# Patient Record
Sex: Female | Born: 1940
Health system: Southern US, Community
[De-identification: ages and names within clinical notes are randomized; demographics above are authoritative.]

## PROBLEM LIST (undated history)

## (undated) DIAGNOSIS — M8580 Other specified disorders of bone density and structure, unspecified site: Secondary | ICD-10-CM

## (undated) DIAGNOSIS — I480 Paroxysmal atrial fibrillation: Secondary | ICD-10-CM

## (undated) DIAGNOSIS — I119 Hypertensive heart disease without heart failure: Secondary | ICD-10-CM

## (undated) DIAGNOSIS — K5730 Diverticulosis of large intestine without perforation or abscess without bleeding: Secondary | ICD-10-CM

## (undated) DIAGNOSIS — I639 Cerebral infarction, unspecified: Secondary | ICD-10-CM

## (undated) DIAGNOSIS — I10 Essential (primary) hypertension: Secondary | ICD-10-CM

## (undated) DIAGNOSIS — I5042 Chronic combined systolic (congestive) and diastolic (congestive) heart failure: Secondary | ICD-10-CM

## (undated) DIAGNOSIS — G459 Transient cerebral ischemic attack, unspecified: Secondary | ICD-10-CM

## (undated) DIAGNOSIS — Z720 Tobacco use: Secondary | ICD-10-CM

## (undated) DIAGNOSIS — K635 Polyp of colon: Secondary | ICD-10-CM

## (undated) HISTORY — PX: CERVICAL FUSION: SHX112

## (undated) HISTORY — DX: Essential (primary) hypertension: I10

## (undated) HISTORY — DX: Tobacco use: Z72.0

## (undated) HISTORY — DX: Diverticulosis of large intestine without perforation or abscess without bleeding: K57.30

## (undated) HISTORY — DX: Other specified disorders of bone density and structure, unspecified site: M85.80

## (undated) HISTORY — DX: Hypertensive heart disease without heart failure: I11.9

## (undated) HISTORY — DX: Chronic combined systolic (congestive) and diastolic (congestive) heart failure: I50.42

## (undated) HISTORY — PX: CARPAL TUNNEL RELEASE: SHX101

## (undated) HISTORY — PX: BREAST BIOPSY: SHX20

## (undated) HISTORY — PX: TONSILLECTOMY: SUR1361

## (undated) HISTORY — PX: ECTOPIC PREGNANCY SURGERY: SHX613

## (undated) HISTORY — DX: Polyp of colon: K63.5

## (undated) NOTE — *Deleted (*Deleted)
Patient is a 48 year old female with a history of hypertension, CHF, stroke, paroxysmal atrial fibrillation on Eliquis who was recently admitted for a GI bleed.  Patient wears 2 to 2-1/2 L of oxygen at home chronically but reports today she did not feel like her oxygen tank was working and she was feeling very short of breath.  When she arrived at her doctor's office she was found to have oxygen saturation of 88% and was encouraged to come to the emergency room.  Upon arrival here patient was placed on 2 L nasal cannula and she reports the shortness of breath resolved.  She has not noticed any new swelling or weight gain and continues to take her home medications.  On exam she is in no acute distress and again reports no shortness of breath.  However labs do show signs of worsening CHF with some mild pleural effusions.  Sats remain 100% on 2 L.  Will have transitional care team get involved with her home health agency to provide her a new oxygen tank here in the emergency room.  Initial troponin is elevated at 197 without an old to compare but is most likely related to her known CHF and heart strain.  We will get a second troponin to ensure they are not trending up however patient denies any chest pain.  Low suspicion for blood clots at this time.  Otherwise CBC and CMP are at baseline.  Given evidence of some fluid overload patient was given 1 dose of IV Lasix as well as a few days of oral Lasix to help with some diuresis prior to patient following up with her PCP.

## (undated) NOTE — *Deleted (*Deleted)
SATURATION QUALIFICATIONS: (This note is used to comply with regulatory documentation for home oxygen)  Patient Saturations on Room Air at Rest = ***%  Patient Saturations on Room Air while Ambulating = ***%  Patient Saturations on *** Liters of oxygen while Ambulating = ***%  Please briefly explain why patient needs home oxygen: 

---
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
## 2001-04-12 ENCOUNTER — Encounter: Admission: RE | Admit: 2001-04-12 | Discharge: 2001-04-12 | Payer: Self-pay | Admitting: Internal Medicine

## 2001-04-12 ENCOUNTER — Encounter: Payer: Self-pay | Admitting: Internal Medicine

## 2003-12-16 ENCOUNTER — Emergency Department (HOSPITAL_COMMUNITY): Admission: EM | Admit: 2003-12-16 | Discharge: 2003-12-16 | Payer: Self-pay | Admitting: Emergency Medicine

## 2005-01-18 ENCOUNTER — Ambulatory Visit: Payer: Self-pay | Admitting: Internal Medicine

## 2005-03-24 ENCOUNTER — Ambulatory Visit: Payer: Self-pay | Admitting: Internal Medicine

## 2005-04-01 ENCOUNTER — Ambulatory Visit: Payer: Self-pay | Admitting: Internal Medicine

## 2005-06-24 ENCOUNTER — Ambulatory Visit: Payer: Self-pay | Admitting: Internal Medicine

## 2005-06-25 ENCOUNTER — Ambulatory Visit (HOSPITAL_COMMUNITY): Admission: RE | Admit: 2005-06-25 | Discharge: 2005-06-25 | Payer: Self-pay | Admitting: Internal Medicine

## 2005-08-24 ENCOUNTER — Inpatient Hospital Stay (HOSPITAL_COMMUNITY): Admission: RE | Admit: 2005-08-24 | Discharge: 2005-08-25 | Payer: Self-pay | Admitting: Neurosurgery

## 2006-03-29 HISTORY — PX: COLONOSCOPY: SHX174

## 2006-04-01 ENCOUNTER — Ambulatory Visit (HOSPITAL_COMMUNITY): Admission: RE | Admit: 2006-04-01 | Discharge: 2006-04-01 | Payer: Self-pay | Admitting: Neurosurgery

## 2006-07-21 ENCOUNTER — Ambulatory Visit: Payer: Self-pay | Admitting: Internal Medicine

## 2006-07-21 LAB — CONVERTED CEMR LAB
BUN: 13 mg/dL (ref 6–23)
Basophils Absolute: 0.1 10*3/uL (ref 0.0–0.1)
Basophils Relative: 0.7 % (ref 0.0–1.0)
CO2: 33 meq/L — ABNORMAL HIGH (ref 19–32)
Calcium: 9.2 mg/dL (ref 8.4–10.5)
Chloride: 103 meq/L (ref 96–112)
Eosinophils Absolute: 0.1 10*3/uL (ref 0.0–0.6)
GFR calc Af Amer: 81 mL/min
Glucose, Bld: 68 mg/dL — ABNORMAL LOW (ref 70–99)
Hemoglobin: 14.5 g/dL (ref 12.0–15.0)
MCV: 89 fL (ref 78.0–100.0)
Neutro Abs: 7.5 10*3/uL (ref 1.4–7.7)
Neutrophils Relative %: 66.2 % (ref 43.0–77.0)
Platelets: 227 10*3/uL (ref 150–400)
Pro B Natriuretic peptide (BNP): 1143 pg/mL — ABNORMAL HIGH (ref 0.0–100.0)
Sodium: 143 meq/L (ref 135–145)
WBC: 11.5 10*3/uL — ABNORMAL HIGH (ref 4.5–10.5)

## 2006-07-28 ENCOUNTER — Emergency Department (HOSPITAL_COMMUNITY): Admission: EM | Admit: 2006-07-28 | Discharge: 2006-07-28 | Payer: Self-pay | Admitting: Emergency Medicine

## 2006-08-09 ENCOUNTER — Ambulatory Visit: Payer: Self-pay

## 2006-08-09 ENCOUNTER — Encounter: Payer: Self-pay | Admitting: Cardiology

## 2006-08-16 ENCOUNTER — Ambulatory Visit: Payer: Self-pay | Admitting: Cardiology

## 2006-08-30 ENCOUNTER — Ambulatory Visit: Payer: Self-pay | Admitting: Cardiology

## 2006-10-07 ENCOUNTER — Ambulatory Visit: Payer: Self-pay | Admitting: Cardiology

## 2007-01-13 ENCOUNTER — Ambulatory Visit: Payer: Self-pay | Admitting: Cardiology

## 2007-01-27 ENCOUNTER — Ambulatory Visit: Payer: Self-pay | Admitting: Internal Medicine

## 2007-01-27 ENCOUNTER — Encounter (HOSPITAL_COMMUNITY): Admission: RE | Admit: 2007-01-27 | Discharge: 2007-02-26 | Payer: Self-pay | Admitting: Cardiology

## 2008-02-18 DIAGNOSIS — F17200 Nicotine dependence, unspecified, uncomplicated: Secondary | ICD-10-CM

## 2008-02-18 DIAGNOSIS — I428 Other cardiomyopathies: Secondary | ICD-10-CM | POA: Insufficient documentation

## 2008-02-18 DIAGNOSIS — M949 Disorder of cartilage, unspecified: Secondary | ICD-10-CM

## 2008-02-18 DIAGNOSIS — K5730 Diverticulosis of large intestine without perforation or abscess without bleeding: Secondary | ICD-10-CM | POA: Insufficient documentation

## 2008-02-18 DIAGNOSIS — M899 Disorder of bone, unspecified: Secondary | ICD-10-CM | POA: Insufficient documentation

## 2008-03-05 ENCOUNTER — Ambulatory Visit: Payer: Self-pay | Admitting: Cardiology

## 2008-09-18 ENCOUNTER — Encounter (INDEPENDENT_AMBULATORY_CARE_PROVIDER_SITE_OTHER): Payer: Self-pay | Admitting: *Deleted

## 2008-10-21 ENCOUNTER — Encounter: Payer: Self-pay | Admitting: Cardiology

## 2008-10-21 ENCOUNTER — Ambulatory Visit: Payer: Self-pay | Admitting: Cardiology

## 2008-10-23 ENCOUNTER — Encounter: Payer: Self-pay | Admitting: Cardiology

## 2008-10-23 ENCOUNTER — Ambulatory Visit (HOSPITAL_COMMUNITY): Admission: RE | Admit: 2008-10-23 | Discharge: 2008-10-23 | Payer: Self-pay | Admitting: Cardiology

## 2008-10-23 ENCOUNTER — Ambulatory Visit: Payer: Self-pay | Admitting: Cardiology

## 2008-10-23 LAB — CONVERTED CEMR LAB
ALT: 11 units/L (ref 0–35)
Alkaline Phosphatase: 76 units/L (ref 39–117)
BUN: 21 mg/dL (ref 6–23)
Chloride: 108 meq/L (ref 96–112)
Glucose, Bld: 79 mg/dL (ref 70–99)
Potassium: 4.7 meq/L (ref 3.5–5.3)
Sodium: 143 meq/L (ref 135–145)

## 2008-10-24 ENCOUNTER — Encounter (INDEPENDENT_AMBULATORY_CARE_PROVIDER_SITE_OTHER): Payer: Self-pay | Admitting: *Deleted

## 2009-10-20 ENCOUNTER — Ambulatory Visit: Payer: Self-pay | Admitting: Cardiology

## 2009-10-22 ENCOUNTER — Ambulatory Visit: Payer: Self-pay | Admitting: Cardiology

## 2009-10-22 ENCOUNTER — Ambulatory Visit (HOSPITAL_COMMUNITY): Admission: RE | Admit: 2009-10-22 | Discharge: 2009-10-22 | Payer: Self-pay | Admitting: Cardiology

## 2009-10-22 ENCOUNTER — Encounter: Payer: Self-pay | Admitting: Cardiology

## 2010-04-20 ENCOUNTER — Encounter (INDEPENDENT_AMBULATORY_CARE_PROVIDER_SITE_OTHER): Payer: Self-pay | Admitting: *Deleted

## 2010-04-23 ENCOUNTER — Ambulatory Visit
Admission: RE | Admit: 2010-04-23 | Discharge: 2010-04-23 | Payer: Self-pay | Source: Home / Self Care | Attending: Cardiology | Admitting: Cardiology

## 2010-04-28 NOTE — Assessment & Plan Note (Signed)
Summary: 10 mth fu/sn  Medications Added LEVOCETIRIZINE DIHYDROCHLORIDE 5 MG TABS (LEVOCETIRIZINE DIHYDROCHLORIDE) Take 1 tablet by mouth once a day NICOTINE 21 MG/24HR PT24 (NICOTINE) weeks 1-4  once daily. NICOTINE 14 MG/24HR PT24 (NICOTINE) weeks 5-6 once daily NICOTINE 7 MG/24HR PT24 (NICOTINE) once      Allergies Added: NKDA  Visit Type:  Follow-up 10 month Primary Provider:  Dr.Wert   History of Present Illness: [name] returns to the office as scheduled for continued assessment and treatment of cardiomyopathy.  Since her initial presentation in 2008, she's been virtually asymptomatic from a cardiac standpoint.  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  She notes no dyspnea, chest pain, lightheadedness or syncope with her usual activities.  She has had no orthopnea, PND nor pedal edema.  Her principal problems involve low back pain exacerbated by movement and skin sensitivity to heat and sun.   Preventive Screening-Counseling & Management  Alcohol-Tobacco     Smoking Status: current     Smoking Cessation Counseling: yes     Smoke Cessation Stage: contemplative     Packs/Day: 0.75     Year Started: 1971  Current Medications (verified): 1)  Furosemide 20 Mg Tabs (Furosemide) .... Take 1 Tab Daily 2)  Coreg 25 Mg Tabs (Carvedilol) .Marland Kitchen.. 1 Tab Two Times A Day 3)  Lisinopril 40 Mg Tabs (Lisinopril) .Marland Kitchen.. 1 Tab Once Daily 4)  Amlodipine Besylate 5 Mg Tabs (Amlodipine Besylate) .Marland Kitchen.. 1 Tab Once Daily 5)  Aleve 220 Mg Tabs (Naproxen Sodium) .... As Needed 6)  Levocetirizine Dihydrochloride 5 Mg Tabs (Levocetirizine Dihydrochloride) .... Take 1 Tablet By Mouth Once A Day 7)  Nicotine 21 Mg/24hr Pt24 (Nicotine) .... Weeks 1-4  Once Daily. 8)  Nicotine 14 Mg/24hr Pt24 (Nicotine) .... Weeks 5-6 Once Daily 9)  Nicotine 7 Mg/24hr Pt24 (Nicotine) ....  Once  Allergies (verified): No Known Drug Allergies  Past History:  PMH, FH, and Social History reviewed and updated.  Social History: Smoking Status:  current Packs/Day:  0.75  Review of Systems       See history of present illness.  Vital Signs:  Patient profile:   70 year old female Height:      66 inches Weight:      170 pounds BMI:     27.54 O2 Sat:      96 % on Room air Temp:     98.3 degrees F oral Pulse rate:   63 / minute BP sitting:   126 / 76  (left arm)  Vitals Entered By: Dreama Saa, CNA (October 20, 2009 11:48 AM)  Nutrition Counseling: Patient's BMI is greater than 25 and therefore counseled on weight management options.  O2 Flow:  Room air  Physical Exam  General:  Mildly overweight; well developed; no acute distress:   Neck-No JVD; no carotid bruits: Lungs-No tachypnea, no rales; no rhonchi; no wheezes: Cardiovascular-normal PMI; normal S1 and S2; minimal systolic ejection murmur: Abdomen-BS normal; soft and non-tender without masses or organomegaly:  Musculoskeletal-No deformities, no cyanosis or clubbing: Neurologic-Normal cranial nerves; symmetric strength and tone:  Skin-Warm, minimal areas of vitiligo over the left lower extremity Extremities-Nl distal pulses except for a decreased left dorsalis pedis; no edema:     Impression & Recommendations:  Problem # 1:  TOBACCO ABUSE (ICD-305.1) Patient appears motivated to completely discontinue cigarette smoking.  I described the recent  adverse effects reported with Chantix.  She is concerned about these and would prefer to utilize nicotine replacement therapy.  We will provide her information about quitting in general and about the use of transdermal nicotine patches, which she will purchase without a prescription.  Problem # 2:  ESSENTIAL HYPERTENSION, BENIGN (ICD-401.1) Blood pressure control has been good; current medications will be continued.  Problem # 3:  CARDIOMYOPATHY (ICD-425.4) She  continues to do extremely well with no symptoms related to her heart disease, and no episodes of decompensation with a fairly modest medical regime.  A repeat echocardiogram will be obtained to determine if left ventricular systolic function has recovered.  I will plan to reassess this nice woman in 6 months.  The importance of discontinuing tobacco use will be emphasized.  If her echocardiogram is now normal or near normal, medications will be tapered as possible.  Other Orders: T-Comprehensive Metabolic Panel [phone]) T-CBC w/Diff [phone]) T-BNP  (B Natriuretic Peptide) ([phone]) 2-D Echocardiogram (2D Echo)  Patient Instructions: 1)  Your physician recommends that you schedule a follow-up appointment in: 6 months 2)  Your physician recommends that you return for lab work [id number] 3)  Your physician has requested that you have an echocardiogram.  Echocardiography is a painless test that uses sound waves to create images of your heart. It provides your doctor with information about the size and shape of your heart and how well your heart's chambers and valves are working.  This procedure takes approximately one hour. There are no restrictions for this procedure. Prescriptions: NICOTINE 7 MG/24HR PT24 (NICOTINE) once  #1 x 0   Entered by:   Teressa Lower RN   Authorized by:   Kathlen Brunswick, MD, [facility]   Signed by:   Teressa Lower RN on 10/20/2009   Method used:   Electronically to        [facility]. [phone]* (retail)       [address] [place], [place]  [address]       Ph: [phone]       Fax: [phone]   RxID:   [id number] NICOTINE 14 MG/24HR PT24 (NICOTINE) weeks 5-6 once daily  #14 x 0   Entered by:   Teressa Lower RN   Authorized by:   Kathlen Brunswick, MD, [facility]   Signed by:   Teressa Lower RN on 10/20/2009   Method used:   Electronically to        [facility]. [phone]* (retail)       [address] [place], [place]   [address]       Ph: [phone]       Fax: [phone]   RxID:   [id number] NICOTINE 21 MG/24HR PT24 (NICOTINE) weeks 1-4  once daily.  #30 x 0   Entered by:   Teressa Lower RN   Authorized by:   Kathlen Brunswick, MD, [facility]   Signed by:   Teressa Lower RN on 10/20/2009   Method used:   Electronically to        [facility]. [phone]* (retail)       [address] [place]  [address]       Ph: [phone]       Fax: [phone]   RxID:   [id number]

## 2010-04-30 NOTE — Miscellaneous (Signed)
Summary: echo 10/22/2009  Clinical Lists Changes  Observations: Added new observation of ECHOINTERP:  Study Conclusions    - Left ventricle: The cavity size was normal. Wall thickness was     increased in a pattern of mild LVH. Systolic function was normal.     The estimated ejection fraction was in the range of 55% to 60%.     Wall motion was normal; there were no regional wall motion     abnormalities. Doppler parameters are consistent with abnormal     left ventricular relaxation (grade 1 diastolic dysfunction).   - Mitral valve: Trivial regurgitation.   - Left atrium: The atrium was at the upper limits of normal in size.   - Atrial septum: The septum was thickened. The interatrial septum     was hypermobile. A patent foramen ovale cannot be excluded.   - Tricuspid valve: Trivial regurgitation.   - Pulmonary arteries: PA peak pressure: 37mm Hg (S).   - Pericardium, extracardiac: There was no pericardial effusion.   Transthoracic echocardiography. M-mode, complete 2D, spectral   Doppler, and color Doppler. Height: Height: 165.1cm. Height: 65in.   Weight: Weight: 77.1kg. Weight: 169.6lb. Body mass index: BMI:   28.3kg/m^2. Body surface area: BSA: 1.66m^2. Patient status:   Outpatient. Location: Echo laboratory.    --------------------------------------------------------------------  (10/22/2009 15:50)      Echocardiogram  Procedure date:  10/22/2009  Findings:       Study Conclusions    - Left ventricle: The cavity size was normal. Wall thickness was     increased in a pattern of mild LVH. Systolic function was normal.     The estimated ejection fraction was in the range of 55% to 60%.     Wall motion was normal; there were no regional wall motion     abnormalities. Doppler parameters are consistent with abnormal     left ventricular relaxation (grade 1 diastolic dysfunction).   - Mitral valve: Trivial regurgitation.   - Left atrium: The atrium was at the upper limits  of normal in size.   - Atrial septum: The septum was thickened. The interatrial septum     was hypermobile. A patent foramen ovale cannot be excluded.   - Tricuspid valve: Trivial regurgitation.   - Pulmonary arteries: PA peak pressure: 37mm Hg (S).   - Pericardium, extracardiac: There was no pericardial effusion.   Transthoracic echocardiography. M-mode, complete 2D, spectral   Doppler, and color Doppler. Height: Height: 165.1cm. Height: 65in.   Weight: Weight: 77.1kg. Weight: 169.6lb. Body mass index: BMI:   28.3kg/m^2. Body surface area: BSA: 1.22m^2. Patient status:   Outpatient. Location: Echo laboratory.    --------------------------------------------------------------------

## 2010-05-06 NOTE — Assessment & Plan Note (Signed)
Summary: Z6X      Allergies Added: NKDA  Visit Type:  Follow-up Primary Provider:  Dr.Wert   History of Present Illness: [name] returns to the office for continued assessment and treatment of cardiomyopathy.  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  Weight has been stable.  She was unable to discontinue cigarette smoking with the use of transdermal nicotine replacement therapy.  Current Medications (verified): 1)  Furosemide 20 Mg Tabs (Furosemide) .... Take 1 Tab Daily 2)  Coreg 25 Mg Tabs (Carvedilol) .Marland Kitchen.. 1 Tab Two Times A Day 3)  Lisinopril 40 Mg Tabs (Lisinopril) .Marland Kitchen.. 1 Tab Once Daily 4)  Amlodipine Besylate 5 Mg Tabs (Amlodipine Besylate) .Marland Kitchen.. 1 Tab Once Daily 5)  Aleve 220 Mg Tabs (Naproxen Sodium) .... As Needed 6)  Levocetirizine Dihydrochloride 5 Mg Tabs (Levocetirizine Dihydrochloride) .... Take 1 Tablet By Mouth Once A Day  Allergies (verified): No Known Drug Allergies  Comments:  Nurse/Medical Assistant: [facility] is patients pharmacy patient and i reviewed meds she is not using her nicotine patches  Past History:  PMH, FH, and Social History reviewed and updated.  Past Medical History: Hypertension. Cardiomyopathy with a history of congestive heart failure and an EF of 25% in 5/08; nl EF on the echo in 2011 Cigarette consumption-50 pack years. Osteopenia. Diverticular disease and colonic polyps.  Past Surgical History: C-Section x 2. Ectopic pregnancy. Breast biopsy. Cervical spine fusion. Right, carpal tunnel release. Colonoscopy-2008; patient uncertain as to whether polypectomy required or performed  Review of Systems       See history of present illness  Vital Signs:  Patient profile:   70 year old female Weight:      173 pounds BMI:     28.02 O2 Sat:      91 % on Room air Pulse rate:   71  / minute BP sitting:   152 / 81  (left arm)  Vitals Entered By: Brandy Saa, CNA (April 23, 2010 1:50 PM)  O2 Flow:  Room air  Physical Exam  General:  Mildly overweight; well developed; no acute distress; raspy voice, which is chronic Neck-No JVD; no carotid bruits: Lungs-No tachypnea, no rales; no rhonchi; no wheezes: Cardiovascular-normal PMI; normal S1 and S2; minimal systolic ejection murmur: Abdomen-BS normal; soft and non-tender without masses or organomegaly:  Musculoskeletal-No deformities, no cyanosis or clubbing: Neurologic-Normal cranial nerves; symmetric strength and tone:  Skin-Warm, minimal areas of vitiligo over the left lower extremity Extremities-distal pulses intact; trace edema:     Impression & Recommendations:  Problem # 1:  CARDIOMYOPATHY (ICD-425.4) Ejection fraction has improved dramatically.  While this could be the result of her medication, I suspect that LV systolic function has recovered.  She is advised to use diuretic on a p.r.n. basis to maintain her current weight.  Her other medications are appropriate for treatment of hypertension.  Problem # 2:  HYPERTENSION (ICD-401.1) Blood pressure control remains suboptimal at this visit.  Patient will collect additional values as an outpatient and return in 2 months for reassessment by the cardiology nurses.  Problem # 3:  TOBACCO ABUSE (ICD-305.1) Brandy [name] is referred to the smoking cessation program of the [facility] psychology division.  My nurses will check on her progress at her 2 month followup visit.  A return visit is anticipated in one year.  Other Orders: Misc. Referral (Misc. Ref)  Patient Instructions: 1)  Your physician recommends that you schedule a follow-up appointment in:  2 months with nurse to see how you are doing with smoking and to check blood pressure 2)  Your physician recommends that you continue on your current medications as directed. Please refer to the Current Medication list  given to you today. 3)  Your physician has requested that you regularly monitor and record your blood pressure readings at home.  Please use the same machine at the same time of day to check your readings and record them to bring to your follow-up visit.

## 2010-05-13 ENCOUNTER — Encounter (INDEPENDENT_AMBULATORY_CARE_PROVIDER_SITE_OTHER): Payer: Self-pay

## 2010-05-13 DIAGNOSIS — F17200 Nicotine dependence, unspecified, uncomplicated: Secondary | ICD-10-CM

## 2010-05-19 ENCOUNTER — Telehealth (INDEPENDENT_AMBULATORY_CARE_PROVIDER_SITE_OTHER): Payer: Self-pay | Admitting: *Deleted

## 2010-05-26 NOTE — Progress Notes (Signed)
Summary: RX REFILL PT IS OUT   Phone Note Call from Patient Call back at Home Phone [phone]   Caller: PT Reason for Call: Refill Medication, Talk to Nurse Summary of Call: PT NEEDS CARVEDILOL CALLED IN [facility] SHE IS OUT OF MEDS/TMJ Initial call taken by: Faythe Ghee,  May 19, 2010 2:02 PM    Prescriptions: COREG 25 MG TABS (CARVEDILOL) 1 tab two times a day  #180 x 3   Entered by:   Teressa Lower RN   Authorized by:   Kathlen Brunswick, MD, [facility]   Signed by:   Teressa Lower RN on 05/19/2010   Method used:   Electronically to        [facility]. [phone]* (retail)       [address] [place]  [address]       Ph: [phone]       Fax: [phone]   RxID:   [id number]

## 2010-06-02 ENCOUNTER — Ambulatory Visit: Payer: Self-pay

## 2010-06-22 ENCOUNTER — Ambulatory Visit (INDEPENDENT_AMBULATORY_CARE_PROVIDER_SITE_OTHER): Payer: Medicare Other

## 2010-06-22 VITALS — BP 143/81 | HR 62 | Ht 66.0 in | Wt 174.0 lb

## 2010-06-22 DIAGNOSIS — I428 Other cardiomyopathies: Secondary | ICD-10-CM

## 2010-06-22 NOTE — Progress Notes (Signed)
S: 2 month follow up B: last office visit  04/23/2010, no change in meds, bp diary A: pt did not do bp readings, no c/o R: await your response  06/29/2010  Increase amlodipine to 10 mg per day Collect blood pressures outside the office as previously requested  [place],  M.D.

## 2010-06-30 ENCOUNTER — Telehealth: Payer: Self-pay

## 2010-06-30 MED ORDER — AMLODIPINE BESYLATE 10 MG PO TABS: 10.0000 mg | ORAL_TABLET | Freq: Every day | ORAL | Status: DC

## 2010-07-01 NOTE — Progress Notes (Signed)
Medication increase discussed and sent to pharmacy by L. Via, LPN, continue to check home blood pressures

## 2010-08-11 NOTE — Letter (Signed)
August 30, 2006    Casimiro Needle B. Sherene Sires, MD, FCCP  [address]  [place] [address]   RE:  Brandy [name], Brandy [name]  MRN:  [id number]  /  DOB:  1940-10-20   Dear Brandy [name]:   [name] returns to the office for continued close followup of  cardiomyopathy. Since her last visit, she feels better. She has  increased energy and increased exercise capacity, but continues to have  a relatively sedentary lifestyle. 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 She notes no edema. She has had no lightheadedness nor syncope.  She notes frequent urination after she takes furosemide in the morning,  but less as the day goes on. She does have nocturia twice overnight.   CURRENT MEDICATIONS:  1. Furosemide 20 mg daily.  2. Carvedilol 6.25 mg daily.  3. Lisinopril 20 mg daily.   PHYSICAL EXAMINATION:  Very well put together woman in no acute  distress. Weight is 161, two pounds less than at her last visit. Blood  pressure 135/80. Heart rate 72 and regular. Respirations 16.  NECK: No jugular venous distention; no carotid bruits.  LUNGS:  Clear.  CARDIAC: Normal 1st and 2nd heart sounds; no third heart sound  appreciated. Modest systolic murmur. Normal PMI.  ABDOMEN: Soft and nontender; no masses; no organomegaly.  EXTREMITIES: Distal pulses intact; no edema.   IMPRESSION:  Brandy [name] is doing well with initial therapy. A chemistry  profile will be repeated. She appears to euvolemic at the present time.  We will titrate carvedilol in a stepwise fashion to  25 mg b.i.d. and lisinopril will be increased to 40  mg daily. She will  continue to monitor blood pressures on an outpatient basis-these have  been elevated with systolics in the 160-170 range. I will see her again  in 5 weeks. At that time, we will consider proceeding with cardiac  catheterization.    Sincerely,      Gerrit Friends. Dietrich Pates, MD, [facility]  Electronically Signed    RMR/MedQ  DD: 08/30/2006  DT: 08/30/2006  Job #: [id number]

## 2010-08-11 NOTE — Letter (Signed)
Aug 16, 2006    Charlaine Dalton. Sherene Sires, M.D., FCCP  [address]  [place], [place]  [address]   Dear Kathlene November:   It was my pleasure to have evaluated this in the office today at your  request.  As you know this nice woman is a long time cigarette smoker  whom you follow for COPD and other medical issues.  She was recently  noted to have increasing dyspnea and fatigue.  She reports moderately  severe shortness of breath with mild exertion that promptly resolves  with rest.  She has had no orthopnea nor PND.  There are no associated  symptoms.  She has had no chest discomfort.  Recent chest x-ray showed  nonspecific findings including some atelectasis and a small left pleural  effusion.  A BNP level was moderately elevated at approximately 1100.  An echocardiogram showed left ventricular dysfunction with an ejection  fraction of  0.20-0.25.  The left atrium was moderately enlarged.  There  was mild left ventricular enlargement.  No significant valvular  abnormalities were identified.   The patient has no prior cardiac history.  She has never previously been  seen by a cardiologist.  She has not undergone significant cardiac  testing.  She has had hypertension that has been suboptimally  controlled.  She does not have diabetes.  She is not aware of her lipid  status.  She does not drink alcohol to excess nor does she have any risk  factors for HIV infection.   RE:  DENNIE, VECCHIO  MRN:  [id number]  /  DOB:  1940-04-18   PAST MEDICAL HISTORY:  The past medical history is otherwise notable for  a history of diverticulosis.  She has osteopenia as documented by a bone  density scan approximately 18 months ago.   PAST SURGICAL HISTORY:  Prior surgeries have included two C sections, a  cervical spinal fusion approximately one year ago, surgery for an  ectopic pregnancy and excisional left breast biopsy for benign disease,  and release of a right carpal tunnel.   ALLERGIES:  The patient has no  known allergies.   MEDICATIONS:  Current medications include verapamil 240 mg daily and  hydrochlorothiazide 25 mg daily.   SOCIAL HISTORY:  The patient is employed as a Runner, broadcasting/film/video.  She is married  with two children.  She continues to smoke cigarettes at the rate of  approximately a half-pack per day.  No excessive use of alcohol.   FAMILY HISTORY:  Father is deceased due to cancer.  Mother died as a  result of postoperative complications.  She has one sibling who is alive  and well; and, as well two children.   REVIEW OF SYSTEMS:  The review of systems is notable only for the need  of corrective lenses for reading.  All other systems are reviewed and  are negative.   PHYSICAL EXAMINATION:  GENERAL APPEARANCE:  On exam this is a pleasant  woman with a raspy voice who is in no acute distress.  VITAL SIGNS:  The weight is 163 pounds; stable when compared to April  2008.  Blood pressure 130/80, heart rate 76 and regular, and  respirations 16.  NECK:  The neck has no jugular venous distention.  Normal carotid  upstrokes without bruits.  ENDOCRINE SYSTEM:  One plus diffuse thyroid enlargement.  HEMAPOIETIC SYSTEM:  No adenopathy.  HEENT:  The patient has bilateral arcus.  EOMs are full.  Normal oral  mucosa.  No lids  and conjunctivae.  LUNGS:  There are coarse breath sounds at both bases.  No rales.  HEART:  Cardiac exam shows normal first and second heart sounds.  A  fourth heart sound is present.  Normal PMI.  No left ventricular lifts.  ABDOMEN:  The abdomen is soft and nontender.  No organomegaly.  EXTREMITIES:  The extremities have 1+ pitting pretibial edema.  The  distal pulses are intact.  NEUROLOGIC EXAMINATION:  Neuromuscular - symmetric strength and tone.  Normal cranial nerves.  PSYCHIATRIC:  Alert and oriented.  Normal affect.   LABORATORY DATA:  Electrocardiogram showed normal sinus rhythm, left  atrial enlargement, delayed R wave progression, cannot exclude prior   septal myocardial infarction.  There are leftward axis and right atrial  enlargement.  Left ventricular hypertrophy is present with  repolarization abnormalities.   IMPRESSION:  This patient presents with cardiomyopathy, but not much in  the way of congestive symptoms.  She does have some edema and other  signs of minor volume expansion.   We will change hydrochlorothiazide to Lasix 20 mg daily.   The patient has left ventricular dysfunction of uncertain etiology.  Eventually it will be necessary to rule out ischemic heart disease as  she has substantial risk factors.   For now we will optimize medication by stopping verapamil and starting  carvedilol 6.25 mg daily and lisinopril 20 mg daily.  She will monitor  her blood pressure at home, follow a low sodium diet and weigh herself  on a regular basis.   I plan to reevaluate this woman in two weeks at which time a metabolic  profile will be obtained.    Sincerely,      Gerrit Friends. Dietrich Pates, MD, [facility]  Electronically Signed    RMR/MedQ  DD: 08/16/2006  DT: 08/16/2006  Job #: [id number]

## 2010-08-11 NOTE — Assessment & Plan Note (Signed)
[facility]                       [place] [facility] OFFICE NOTE   NAME:Brandy, [name]                       MRN:          [id number]  DATE:10/07/2006                            DOB:          12/06/1940    [name] return to the office for continued assessment and treatment of  cardiomyopathy.  Since her last visit, she has felt quite well.  She  denies all cardiopulmonary symptoms.  She has unfortunately confused her  medications, to a degree.  She has taken furosemide 20 mg daily and  lisinopril 40 mg daily, as requested, but is only taking Coreg 25 mg  daily instead of b.i.d.   PHYSICAL EXAMINATION:  GENERAL:  A pleasant, proportionate woman in no  acute distress.  VITAL SIGNS:  Weight is 162, 1 pound more than at her last visit.  Blood  pressure 145/90, heart rate 65 and regular, respirations 18.  NECK:  No jugular venous distention; no carotid bruits.  LUNGS:  Clear.  CARDIAC:  Normal first and second heart sounds; minimal systolic  ejection murmur.  ABDOMEN:  Soft and nontender; no organomegaly.  EXTREMITIES:  Trace edema.   The most recent laboratory was one month ago.  A complete metabolic  profile was entirely normal.   RHYTHM STRIP:  Normal sinus rhythm; normal P-R interval; IVCD.   IMPRESSION:  [name] is doing generally well with medical therapy.  Her  dose of carvedilol will be increased to 25 mg b.i.d., which hopefully  will provide optimal blood pressure.  A chemistry profile will be  obtained in six weeks with a return office visit in three months.     Gerrit Friends. Dietrich Pates, MD, [facility]  Electronically Signed    RMR/MedQ  DD: 10/07/2006  DT: 10/08/2006  Job #: [id number]

## 2010-08-11 NOTE — Assessment & Plan Note (Signed)
[facility]                       [place] [facility] OFFICE NOTE   NAME:BELLKawthar, Ennen                       MRN:          [id number]  DATE:03/05/2008                            DOB:          1940-11-21    Ms. Harm returns to the office beyond her anticipated followup visit for  cardiomyopathy with a history of congestive heart failure.  Fortunately,  she has done quite well from symptomatic standpoint.  She has class II-  III dyspnea with walking stairs or excessive effort.  Symptoms resolve  quickly with rest.  She has had no orthopnea nor PND.  She has noted no  pedal edema.   A stress nuclear study was performed in late October.  She failed to  return for a post-test visit.  That study showed moderately impaired  left ventricular systolic function with an estimated ejection fraction  of 0.37, which was somewhat improved compared to previous  echocardiogram.  The reading was otherwise equivocal with possible  evidence of scar in the distribution of mid distal LAD or breast  attenuation artifact.  There was no evidence for ischemia.   Ms. Adduci has had no new medical problems.  She tried to discontinue  cigarette smoking with the assistance of Chantix, but was unsuccessful.   Current medications include furosemide 20 mg daily, carvedilol 25 mg  b.i.d., lisinopril 40 mg daily, amlodipine 5 mg daily.  She uses Aleve  on a p.r.n. basis.   On exam, pleasant woman in no acute distress.  The weight is 166, 2  pounds more than in October of 2008.  Blood pressure 130/80, heart rate  65 and regular, respirations 12 and unlabored.  Neck, no jugular venous  distention; no carotid bruits.  Lungs, clear.  Cardiac, normal first and  second heart sounds; modest systolic ejection murmur.  Fourth heart  sound present.  Abdomen, soft and nontender; no organomegaly.  Extremities, trace edema.   IMPRESSION:  Ms. Wisz is doing quite well with current medical  therapy.  We once again discussed the advisability of an automatic implantable  cardioverter-defibrillator.  Once again, she is not willing to commit to  this device.  Monitoring laboratories including a chemistry profile and  BNP level will be obtained.  If results are good, I will plan to see  this nice woman again in 6 months.  She once again agrees to attempt to  stop smoking cigarettes.     Gerrit Friends. Dietrich Pates, MD, [facility]  Electronically Signed    RMR/MedQ  DD: 03/05/2008  DT: 03/06/2008  Job #: [id number]

## 2010-08-11 NOTE — Letter (Signed)
January 13, 2007    Casimiro Needle B. Sherene Sires, MD, FCCP  [address]  [place] [address]   RE:  Brandy [name], Brandy [name]  MRN:  [id number]  /  DOB:  Aug 11, 1940   Dear Kathlene November:   Brandy [name] return to the office for continued assessment and treatment of  cardiomyopathy.  Since her last visit, she has done quite well from that  standpoint.  She reports no dyspnea, orthopnea, palpitations, PND, nor  chest discomfort.  She has developed pain over her left ankle with  erythema.  Brandy followed a flare of the chronic skin condition that she  has in both feet.  She reports no fevers or chills.   CURRENT MEDICATIONS:  1. Furosemide 20 mg daily.  2. Carvedilol 25 mg b.i.d.  3. Lisinopril 40 mg daily.   A chemistry profile three months ago was normal.   PHYSICAL EXAMINATION:  A very pleasant, well-appearing [name] in no acute  distress.  The weight is 164, 2 pounds more than three months ago.  Blood pressure  150/80, heart rate 62 and regular, respirations 16.  Temperature 97.3.  NECK:  No jugular venous distention; no carotid bruits.  LUNGS:  Expiratory rhonchi, particularly at the left base.  Prolonged  expiratory phase.  CARDIAC:  Normal first and second heart sounds; fourth heart sound  present. No murmur appreciated.  ABDOMEN:  Soft and nontender; no organomegaly.  EXTREMITIES:  Erythema over the medial aspect of the left ankle,  extending somewhat up the left lower leg with mild edema and moderate  tenderness.  Some discomfort with standing and with ankle flexion but no  clear joint effusion.   IMPRESSION:  Brandy [name] is doing extremely well from a cardiac standpoint.  We discussed the possibility of a defibrillator implant.  She would like  to think about Brandy and discuss it again at our next appointment.  She  continues to smoke cigarettes at the rate of 3/4 pack per day.  She  agrees to a trial of Chantix and smoking cessation.  Finally, she  appears to have cellulitis of the left ankle.  Brandy  could be gout, but I  do not think so.  We will give her a course of cephalexin and re-  evaluate her in one week, at which time a stress test will be performed  to evaluate the etiology of her cardiomyopathy.  I will see Brandy nice  [name] again in the office in three months but will recheck her  cellulitis in one week.  Her blood pressure is suboptimally controlled.  She will start amlodipine 5 mg daily and continue to monitor her blood  pressure at work.    Sincerely,      Gerrit Friends. Dietrich Pates, MD, [facility]  Electronically Signed    RMR/MedQ  DD: 01/13/2007  DT: 01/14/2007  Job #: [id number]

## 2010-08-14 NOTE — Op Note (Signed)
[name], Brandy [name]              ACCOUNT NO.:  [id number]   MEDICAL RECORD NO.:  [id number]          PATIENT TYPE:  AMB   LOCATION:  SDS                          FACILITY:  [facility]   PHYSICIAN:  Henry A. Pool, M.D.    DATE OF BIRTH:  1940-04-03   DATE OF PROCEDURE:  DATE OF DISCHARGE:                               OPERATIVE REPORT   PREOPERATIVE DIAGNOSIS:  Right carpal tunnel syndrome.   POSTOPERATIVE DIAGNOSIS:  Right carpal tunnel syndrome.   PROCEDURE NOTE:  Right carpal tunnel release.   SURGEON:  Kathaleen Maser. Pool, M.D.   ANESTHESIA:  Regional Bier block.   INDICATIONS:  Ms. Mersch 70 year old female with history of right hand  pain and paresthesias with some degree of weakness consistent with  carpal tunnel syndrome confirmed by EMGs and nerve conduction studies  failing conservative management.  She presents now for right-sided  carpal tunnel release.   OPERATIVE NOTE:  Patient taken operating room, placed on operating table  in supine position.  After adequate level of regional anesthesia  achieved, the patient's right hand and forearm were prepped and draped  sterilely.  15 blade used to make a linear skin incision along the mid  palmar line just distal to the distal wrist crease.  This carried down  sharply through the palmar fascia down to level of the transverse carpal  ligament.  Self-retaining retractor was placed.  Transverse carpal  ligament was then divided exposing the underlying median nerve.  The  median nerve was then protected with a Therapist, nutritional and the transverse  carpal ligament was then divided distally into the palm and then divided  proximally back up into the forearm.  All elements of the ligament were  divided.  There is no evidence of injury to the underlying nerve.  The  nerve itself was well decompressed.  Wound was then irrigated and then  closed with 4-0 Vicryl suture in the dermis in interrupted subcuticular  fashion and a 4-0 nylon in  interrupted vertical mattress fashion at the  surface.  There no complications.  The patient tolerated procedure well  and she returns for postoperative care.           ______________________________  Kathaleen Maser. Pool, M.D.     HAP/MEDQ  D:  04/01/2006  T:  04/01/2006  Job:  [id number]

## 2010-08-14 NOTE — Op Note (Signed)
NAMECYLA, HALUSKA NO.:  [id number]   MEDICAL RECORD NO.:  [id number]          PATIENT TYPE:  INP   LOCATION:  3004                         FACILITY:  [facility]   PHYSICIAN:  Kathaleen Maser. Pool, M.D.    DATE OF BIRTH:  04/20/1940   DATE OF PROCEDURE:  08/24/2005  DATE OF DISCHARGE:                                 OPERATIVE REPORT   PREOPERATIVE DIAGNOSIS:  C5-C6 and C6-C7 spondylosis with radiculopathy.   POSTOPERATIVE DIAGNOSIS:  C5-C6 and C6-C7 spondylosis with radiculopathy.   PROCEDURE:  C5-C6 and C6-C7 anterior cervical diskectomy and fusion with  allograft and plating.   SURGEON:  Kathaleen Maser. Pool, M.D.   ASSISTANT:  Reinaldo Meeker, M.D.   ANESTHESIA:  General   INDICATIONS FOR PROCEDURE:  Ms. Gassner is a 70 year old female with a history  of neck and upper extremity pain, paresthesias and weakness with both a  right-sided C6 and C7 radiculopathy.  Workup has demonstrated marked  spondylosis at C5-C6 and C6-C7.  She presents now for a two level anterior  cervical diskectomy and fusion with allograft and plating.   OPERATIVE NOTE:  The patient was taken to the operating room and placed on  the table in a supine position.  After general anesthesia was achieved, the  patient was placed supine with her neck slightly extended and held in place  with halter traction.  The patient's anterior cervical region was prepped  and draped sterilely.  A 10 blade was used to make a linear skin incision  overlying the C6 vertebral level.  This was carried down sharply to the  platysma.  The platysma was then divided vertically and dissection to the  medial border of sternomastoid muscle and carotid sheath.  The trachea and  esophagus were mobilized and retracted to the left.  The pre-vertebral  fascia was stripped off the anterior spinal column.  The longus colli was  then elevated bilaterally using electrocautery.  Deep self-retaining  retractor was placed.  Intraoperative  fluoroscopy was used and the levels  were confirmed.  Disk space was then incised with a 15 blade in rectangular  fashion and wide disk space clean out was then achieved using pituitary  rongeurs, forward and back Carlens curets, Kerrison rongeurs, and a high  speed drill.  All elements of the disk were removed down to the level of the  posterior annulus.  The microscope was then brought on the field and used  throughout the remainder of the diskectomy.  Starting first C5-C6, the high  speed drill was used to remove the residual annulus and osteophytes down to  the level of the posterior longitudinal ligament.  The posterior  longitudinal ligament was then elevated and resected in a piecemeal fashion  using Kerrison rongeurs.  The underlying thecal sac was identified.  A wide  central decompression was then performed by undercutting the bodies of C5  and C6.  Decompression proceeded out each neural foramen.  A wide anterior  foraminotomy was performed along the course of the exiting nerve roots  bilaterally.  The procedure was then repeated at C6-C7,  again without  complication.  The wound was then irrigated with antibiotic solution.  Gelfoam was placed topically for hemostasis which was found to be good.  A 5  mm Lifenet allograft wedge was then impacted into place at both C5-C6 and C6-  C7.  Both grafts were recessed approximately 1 mm from the anterior cortical  surface.  A 40 mm anterior cervical plate was then placed over the C5, C6,  and C7 levels.  This was then attached under fluoroscopic guidance using 13  mm variable angled screws, two each at all three levels.  The plate was  securely locked in place.  The wound was then irrigated with antibiotic  solution.  Gelfoam was placed topically for hemostasis which was found to be  good.  Final images  revealed good position of the bone graft, hardware, and normal spine.  The  wound was then irrigated one final time and then closed in  typical fashion.  Steri-Strips and sterile dressing were applied.  There were no  complications.  The patient tolerated the procedure well and she returns to  the recovery room in good condition.           ______________________________  Kathaleen Maser Pool, M.D.     HAP/MEDQ  D:  08/24/2005  T:  08/24/2005  Job:  [id number]

## 2010-08-14 NOTE — Assessment & Plan Note (Signed)
[facility]                             PULMONARY OFFICE NOTE   NAME:[name], Brandy                       MRN:          [id number]  DATE:07/21/2006                            DOB:          [birth year]    PRIMARY SERVICE/ACUTE OFFICE EVALUATION:  A 70 year old black female  with a history of hypertension, has not been able to stay on her  medicines for reasons that are not clear for the last several months and  comes in now for a three day history of worsening orthopnea associated  with a dry cough for the last three days.  She continues to smoke a half  pack per day.  She denies any pleuritic or exertional chest pain.  During coughing paroxysms, she says she gets slightly sweaty but  denies any nausea, fevers, chills, sweats, or purulent sputum,  subjective wheeze.  She denies any orthopnea, PND, or significant leg  swelling.   PAST MEDICAL HISTORY:  Significant for hypertension and moderate  obesity.  She is status post remote C-section.  She has osteopenia by  previous bone densitometry in July, 2004 but has not been back to follow  up from her last bone densitometry report on April 08, 2005.   ALLERGIES:  None known.   MEDICATIONS:  She really does not take any medicines consistently now.   SOCIAL HISTORY:  She smoked a half pack per day.   FAMILY HISTORY:  Positive for hypertension in her mother.  She does not  know anything about her father's side of the family.  No one has  premature heart disease, however, to her knowledge.   REVIEW OF SYSTEMS:  Taken in detail on the work sheet and essentially  for problems outlined above.  She had recent hand numbness, possibly  related to carpal tunnel on the right and was treated with a short  course of prednisone.   PHYSICAL EXAMINATION:  GENERAL:  This is a pleasant, ambulatory black  female in no acute distress.  VITAL SIGNS:  Afebrile with normal vital signs except for a blood  pressure of  170/98.  HEENT:  Unremarkable.  Her oropharynx is clear.  NECK:  Supple without cervical adenopathy or tenderness.  Trachea is  midline.  LUNGS:  Lung fields reveal no significant crackles on inspiration or  wheeze on expiration.  HEART:  There is a regular rhythm with a summation gallop.  ABDOMEN:  Soft, benign.  No organomegaly, masses, or ascites.  EXTREMITIES:  Warm without calf tenderness, clubbing, cyanosis or edema.   EKG today revealed classic left atrial abnormality with LVH with strain  pattern.   IMPRESSION:  Probable diastolic dysfunction secondary to hypertension,  which in turn may have been triggered by the use of prednisone, and also  obviously by nonadherence to antihypertensive regimen with new onset of  orthopnea related to poor compliance with medications.  I have  recommended that she restart verapamil nightly at 240 mg a day and gave  her Diovan 160/25 now.  I advised her if her condition worsens, she  needs to go to the emergency room.  A BMET and BNP as well as CBC with  differential are pending, and we will see her back here in six weeks for  comprehensive health care evaluation if she improves.  If not, she is to  call our office to see our nurse practitioner in the meantime for  further evaluation, which may include an echocardiogram if indicated by  the BNP.     Charlaine Dalton. Sherene Sires, MD, [facility]  Electronically Signed    MBW/MedQ  DD: 07/21/2006  DT: 07/22/2006  Job #: [id number]

## 2010-08-15 ENCOUNTER — Other Ambulatory Visit (HOSPITAL_COMMUNITY): Payer: Self-pay | Admitting: Family Medicine

## 2010-08-15 ENCOUNTER — Ambulatory Visit (HOSPITAL_COMMUNITY)
Admission: RE | Admit: 2010-08-15 | Discharge: 2010-08-15 | Disposition: A | Discharge status: Home / Self Care | Payer: Medicare Other | Source: Ambulatory Visit | Attending: Family Medicine | Admitting: Family Medicine

## 2010-08-15 ENCOUNTER — Ambulatory Visit (HOSPITAL_COMMUNITY): Admission: EM | Admit: 2010-08-15 | Payer: Medicare Other | Source: Ambulatory Visit | Admitting: Family Medicine

## 2010-08-15 DIAGNOSIS — Z720 Tobacco use: Secondary | ICD-10-CM

## 2010-08-15 DIAGNOSIS — F17200 Nicotine dependence, unspecified, uncomplicated: Secondary | ICD-10-CM | POA: Insufficient documentation

## 2010-11-02 ENCOUNTER — Other Ambulatory Visit: Payer: Self-pay | Admitting: Cardiology

## 2010-11-18 ENCOUNTER — Other Ambulatory Visit: Payer: Self-pay | Admitting: Family Medicine

## 2010-11-18 ENCOUNTER — Ambulatory Visit
Admission: RE | Admit: 2010-11-18 | Discharge: 2010-11-18 | Disposition: A | Discharge status: Home / Self Care | Payer: Medicare Other | Source: Ambulatory Visit | Attending: Family Medicine | Admitting: Family Medicine

## 2010-11-18 DIAGNOSIS — W19XXXA Unspecified fall, initial encounter: Secondary | ICD-10-CM

## 2011-01-04 ENCOUNTER — Encounter: Payer: Self-pay | Admitting: Cardiology

## 2011-02-22 ENCOUNTER — Other Ambulatory Visit: Payer: Self-pay | Admitting: Cardiology

## 2011-05-04 ENCOUNTER — Encounter: Payer: Self-pay | Admitting: Adult Health

## 2011-05-04 ENCOUNTER — Ambulatory Visit (INDEPENDENT_AMBULATORY_CARE_PROVIDER_SITE_OTHER): Payer: Medicare Other | Admitting: Adult Health

## 2011-05-04 VITALS — BP 152/83 | HR 62 | Resp 16 | Ht 64.0 in | Wt 172.0 lb

## 2011-05-04 DIAGNOSIS — I10 Essential (primary) hypertension: Secondary | ICD-10-CM

## 2011-05-04 DIAGNOSIS — I428 Other cardiomyopathies: Secondary | ICD-10-CM

## 2011-05-04 MED ORDER — HYDROCHLOROTHIAZIDE 12.5 MG PO CAPS: 12.5000 mg | ORAL_CAPSULE | Freq: Every day | ORAL | Status: DC

## 2011-05-04 NOTE — Progress Notes (Signed)
   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 She comes today without cardiac complaint, DOE, edema. She has chronic back pain and cervical pain with some neuropathy with tingling in her hands. She remains very active.   No Known Allergies  Current Outpatient Prescriptions  Medication Sig Dispense Refill  . amLODipine (NORVASC) 10 MG tablet TAKE 1 TABLET BY MOUTH EVERY DAY  90 tablet  1  . carvedilol (COREG) 25 MG tablet Take 25 mg by mouth 2 (two) times daily.        . furosemide (LASIX) 20 MG tablet TAKE 1 TABLET BY MOUTH DAILY  90 tablet  1  . lisinopril (PRINIVIL,ZESTRIL) 40 MG tablet TAKE 1 TABLET BY MOUTH EVERY DAY AS DIRECTED  90 tablet  1  . naproxen sodium (ANAPROX) 220 MG tablet Take 220 mg by mouth as needed.        . hydrochlorothiazide (MICROZIDE) 12.5 MG capsule Take 1 capsule (12.5 mg total) by mouth daily.  30 capsule  6  . DISCONTD: amLODipine (NORVASC) 10 MG tablet TAKE 1 TABLET BY MOUTH EVERY DAY  30 tablet  2    Past Medical History  Diagnosis Date  . Hypertension   . Tobacco abuse     50 pack year  . Osteopenia   . Cardiomyopathy     with  a history of congestive heart failure and an EF of 25% in 5/08; nl EF  on the echo in 2011  . Diverticulosis of colon   . Colon polyps     Past Surgical History  Procedure Date  . Cesarean section     2 times  . Ectopic pregnancy surgery   . Breast biopsy   . Cervical fusion   . Carpal tunnel release     right hand  . Colonoscopy 2008    pt uncertain as to whether polypectomy required or performed  . Tonsillectomy     WJX:BJYNWG of systems complete and found to be negative unless listed above PHYSICAL EXAM BP 152/83  Pulse 62  Resp 16  Ht 5\' 4"  (1.626 m)  Wt 172 lb (78.019 kg)  BMI 29.52 kg/m2  General: Well developed, well nourished, in no acute distress Head: Eyes PERRLA, No xanthomas.   Normal  cephalic and atramatic  Lungs: Clear bilaterally to auscultation and percussion. Heart: HRRR S1 S2, without MRG.  Pulses are 2+ & equal.            No carotid bruit. No JVD.  No abdominal bruits. No femoral bruits. Abdomen: Bowel sounds are positive, abdomen soft and non-tender without masses or                  Hernia's noted. Msk:  Back normal, normal gait. Normal strength and tone for age. Extremities: No clubbing, cyanosis or edema.  DP +1 Neuro: Alert and oriented X 3. Psych:  Good affect, responds appropriately    ASSESSMENT AND PLAN

## 2011-05-04 NOTE — Patient Instructions (Signed)
Your physician has recommended you make the following change in your medication: start taking Hydrochlorothiazide 12.5 mg daily  Your physician recommends that you schedule a follow-up appointment in: 1 week for a blood pressure check with nurse and in 6 months with provider

## 2011-05-04 NOTE — Assessment & Plan Note (Signed)
She is doing well but BP is not well controlled at present. She is on multiple medications and is at maximum dose of amlodipine, carvedilol, lisinopril. Will add HCTZ to current regimen. Would like to see BP in the 135 range systolic. She will follow-up in a week for BP check in the office. Plan labs next week.

## 2011-05-11 ENCOUNTER — Ambulatory Visit (INDEPENDENT_AMBULATORY_CARE_PROVIDER_SITE_OTHER): Payer: Medicare Other | Admitting: *Deleted

## 2011-05-11 ENCOUNTER — Encounter: Payer: Medicare Other | Admitting: *Deleted

## 2011-05-11 ENCOUNTER — Other Ambulatory Visit: Payer: Self-pay

## 2011-05-11 VITALS — BP 132/81 | HR 61 | Ht 64.0 in | Wt 176.0 lb

## 2011-05-11 DIAGNOSIS — I10 Essential (primary) hypertension: Secondary | ICD-10-CM

## 2011-05-11 DIAGNOSIS — I428 Other cardiomyopathies: Secondary | ICD-10-CM

## 2011-05-11 NOTE — Progress Notes (Signed)
**Note De-Identified  Obfuscation** S: Pt. arrives in office for a 1 week BP check with nurse. B: On last OV with Joni Reining, NP on 05-04-11 pt. was advised to start taking Hydrochlorothiazide 12.5 mg daily and to come to today's BP check. A: Pt. has no complaints at this time. Her BP this morning is 132/81 and on last OV her BP was 152/83.  R: Pt. Is advised to continue current medical treatment and that we will contact her with Joni Reining, NP's recommendations, if any.

## 2011-05-11 NOTE — Progress Notes (Signed)
No further recommendations, good BP control. She will need BMET to evaluate kidney status. Schedule sometime this week.  Thanks Samara Deist

## 2011-07-02 ENCOUNTER — Other Ambulatory Visit: Payer: Self-pay | Admitting: Cardiology

## 2011-07-02 ENCOUNTER — Other Ambulatory Visit: Payer: Self-pay | Admitting: Adult Health

## 2011-10-01 ENCOUNTER — Ambulatory Visit
Admission: RE | Admit: 2011-10-01 | Discharge: 2011-10-01 | Disposition: A | Discharge status: Home / Self Care | Payer: Medicare Other | Source: Ambulatory Visit | Attending: Family Medicine | Admitting: Family Medicine

## 2011-10-01 ENCOUNTER — Other Ambulatory Visit: Payer: Self-pay | Admitting: Family Medicine

## 2011-10-01 DIAGNOSIS — R52 Pain, unspecified: Secondary | ICD-10-CM

## 2011-11-09 ENCOUNTER — Ambulatory Visit: Payer: Medicare Other | Admitting: Adult Health

## 2011-11-15 ENCOUNTER — Encounter: Payer: Self-pay | Admitting: Adult Health

## 2011-11-15 ENCOUNTER — Ambulatory Visit (INDEPENDENT_AMBULATORY_CARE_PROVIDER_SITE_OTHER): Payer: BC Managed Care – PPO | Admitting: Adult Health

## 2011-11-15 VITALS — BP 120/70 | HR 75 | Ht 64.0 in | Wt 183.2 lb

## 2011-11-15 DIAGNOSIS — M949 Disorder of cartilage, unspecified: Secondary | ICD-10-CM

## 2011-11-15 DIAGNOSIS — I428 Other cardiomyopathies: Secondary | ICD-10-CM

## 2011-11-15 DIAGNOSIS — M899 Disorder of bone, unspecified: Secondary | ICD-10-CM

## 2011-11-15 MED ORDER — CARVEDILOL PHOSPHATE ER 40 MG PO CP24: 40.0000 mg | ORAL_CAPSULE | Freq: Every day | ORAL | Status: DC

## 2011-11-15 MED ORDER — PANTOPRAZOLE SODIUM 40 MG PO TBEC: 40.0000 mg | DELAYED_RELEASE_TABLET | Freq: Every day | ORAL | Status: DC

## 2011-11-15 NOTE — Patient Instructions (Addendum)
Start Protonix 40mg  daily.  Start Coreg CR 40mg  daily.  Your physician wants you to follow-up in: 6 months with Joni Reining, NP.  You will receive a reminder letter in the mail two months in advance. If you don't receive a letter, please call our office to schedule the follow-up appointment.

## 2011-11-15 NOTE — Assessment & Plan Note (Signed)
She is doing well concerning blood pressure control. She is complaining of some fatigue however. I discussed with consideration of changing Coreg 25 mg twice a day to Coreg CR 40 mg to be taken at at bedtime, with the hopes of diminishing side effects of fatigue and sluggishness. We will call her pharmacy, [facility] here in [place], and the cost is $69 a month. She states that she is willing to try this as the cost is something that she can afford. We will give her one month trial of the long-acting Coreg to see if this helps her symptoms. If it does not, she is to return back to the 25 mg twice a day dosing. She will need to have a followup echocardiogram to evaluate for LV function as was not been completed since 2011. It is my hope that she will have improved systolic function on current medication regimen.

## 2011-11-15 NOTE — Assessment & Plan Note (Signed)
She complains of pain in her neck and lower back. She will followup with primary care physician for this. However with GERD symptoms and use of naproxen sodium, I provided her with a prescription for protonix 40 mg daily to assist with these symptoms which may be related to NSAID use. If this medication does indeed work for her she should discuss this with her primary care physician, for continued prescription refills. I have advised her that it may take a couple days for her to feel improvement of her symptoms on protonix.

## 2011-11-15 NOTE — Progress Notes (Signed)
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 Ringwood Bing. We follow her for ongoing assessment treatment of hypertensive cardiomyopathy. On last visit the patient was started on HCTZ as her blood pressure was not well controlled in addition to her current medication regimen. She has done quite well with her blood pressure since that time. Her main complaint today is that of fatigue. She is on carvedilol 25 mg twice a day and has been feeling sluggish. She also has some issues with arthritis in her back, and has been taking a lot of naproxen sodium to assist with pain control. This is leading to a lot of GERD symptoms. Otherwise the patient remains active with her teaching and choral direction.  No Known Allergies  Current Outpatient Prescriptions  Medication Sig Dispense Refill  . amLODipine (NORVASC) 10 MG tablet TAKE 1 TABLET BY MOUTH EVERY DAY  90 tablet  1  . furosemide (LASIX) 20 MG tablet TAKE 1 TABLET BY MOUTH EVERY DAY  90 tablet  1  . hydrochlorothiazide (MICROZIDE) 12.5 MG capsule Take 1 capsule (12.5 mg total) by mouth daily.  30 capsule  6  . lisinopril (PRINIVIL,ZESTRIL) 40 MG tablet TAKE 1 TABLET BY MOUTH EVERY DAY AS DIRECTED  90 tablet  1  . naproxen sodium (ANAPROX) 220 MG tablet Take 220 mg by mouth as needed.        . carvedilol (COREG CR) 40 MG 24 hr capsule Take 1 capsule (40 mg total) by mouth daily.  30 capsule  3  . pantoprazole (PROTONIX) 40 MG tablet Take 1 tablet (40 mg total) by mouth daily.  30 tablet  11    Past Medical History  Diagnosis Date  . Hypertension   . Tobacco abuse     50 pack year  . Osteopenia   . Cardiomyopathy     with  a history of congestive heart failure and an EF of 25% in 5/08; nl EF  on the echo in 2011  . Diverticulosis of colon   . Colon polyps     Past Surgical History  Procedure Date  . Cesarean section     2 times  . Ectopic pregnancy surgery   . Breast  biopsy   . Cervical fusion   . Carpal tunnel release     right hand  . Colonoscopy 2008    pt uncertain as to whether polypectomy required or performed  . Tonsillectomy     WUJ:WJXBJY of systems complete and found to be negative unless listed above  PHYSICAL EXAM BP 120/70  Pulse 75  Ht 5\' 4"  (1.626 m)  Wt 183 lb 4 oz (83.122 kg)  BMI 31.45 kg/m2  General: Well developed, well nourished, obese, in no acute distress Head: Eyes PERRLA, No xanthomas.   Normal cephalic and atramatic  Lungs: Clear bilaterally to auscultation and percussion. Heart: HRRR S1 S2, without MRG.  Pulses are 2+ & equal.            No carotid bruit. No JVD.  No abdominal bruits. No femoral bruits. Abdomen: Bowel sounds are positive, abdomen soft and non-tender without masses or                  Hernia's noted. Msk:  Back normal, normal gait. Normal strength and tone for age. Extremities: No clubbing, cyanosis or edema.  DP +1 Neuro: Alert and oriented X 3. Psych:  Good affect, responds appropriately  ASSESSMENT AND PLAN

## 2011-11-26 ENCOUNTER — Telehealth: Payer: Self-pay | Admitting: Adult Health

## 2011-11-26 NOTE — Telephone Encounter (Signed)
PT WAS PLACED ON COREG THIS WEEK AND SHE TOOK IT Monday IT CAUSED HER EYES AND LIPS TO SWELL UP. SHE DID NOT TAKE IT SINCE THEN

## 2011-11-26 NOTE — Telephone Encounter (Signed)
Per Joni Reining, NP scheduled an appt with her for next Wednesday and stop Coreg.  Message left for patient.

## 2011-11-30 ENCOUNTER — Telehealth: Payer: Self-pay | Admitting: Adult Health

## 2011-11-30 NOTE — Telephone Encounter (Signed)
Pt returning phone call.tmj

## 2011-12-01 ENCOUNTER — Ambulatory Visit: Payer: Medicare Other | Admitting: Adult Health

## 2011-12-03 ENCOUNTER — Ambulatory Visit (INDEPENDENT_AMBULATORY_CARE_PROVIDER_SITE_OTHER): Payer: BC Managed Care – PPO | Admitting: Adult Health

## 2011-12-03 ENCOUNTER — Encounter: Payer: Self-pay | Admitting: Adult Health

## 2011-12-03 VITALS — BP 140/70 | HR 66 | Ht 65.0 in | Wt 180.8 lb

## 2011-12-03 DIAGNOSIS — I428 Other cardiomyopathies: Secondary | ICD-10-CM

## 2011-12-03 NOTE — Assessment & Plan Note (Signed)
The patient will stop the Coreg CR, and she is currently doing. She will return back to Coreg 25 mg twice a day and she was taking before and tolerated well. Her main complaint is fatigue associated with this medication but she is willing to continue to take it as before to avoid any recurrence of symptoms with the long-acting Coreg. We will add Coreg CR to her allergy list. The patient will return in 6 months unless she becomes symptomatic.

## 2011-12-03 NOTE — Progress Notes (Signed)
HPI: Brandy [name] is a very pleasant 71 year old patient of Dr. 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 Should we follow her for ongoing assessment treatment of hypertensive cardiomyopathy. The patient was last seen in the office approximately 2 weeks ago where her Coreg 25 mg twice a day was changed to long-acting coronary 40 mg CR. This was completed secondary to patient's complaints of fatigue on twice a day dose. Unfortunately she was unable to tolerate this new medication and promptly had a reaction to include swelling of both eyes, and lips with facial swelling. She took Benadryl which helped with her symptoms. She did not have any shortness of breath, hives, or tongue swelling. She did not take any other doses of the long-acting Coreg. In fact she is not taking any more Coreg since that day. She has had no further complaints of similar symptoms. She is having some neck and shoulder pain which has been chronic for her since having had cervical disc repair.  Allergies  Allergen Reactions  . Coreg Cr (Carvedilol) Swelling    Current Outpatient Prescriptions  Medication Sig Dispense Refill  . amLODipine (NORVASC) 10 MG tablet TAKE 1 TABLET BY MOUTH EVERY DAY  90 tablet  1  . carvedilol (COREG) 25 MG tablet Take 25 mg by mouth 2 (two) times daily with a meal.      . furosemide (LASIX) 20 MG tablet TAKE 1 TABLET BY MOUTH EVERY DAY  90 tablet  1  . hydrochlorothiazide (MICROZIDE) 12.5 MG capsule Take 1 capsule (12.5 mg total) by mouth daily.  30 capsule  6  . lisinopril (PRINIVIL,ZESTRIL) 40 MG tablet TAKE 1 TABLET BY MOUTH EVERY DAY AS DIRECTED  90 tablet  1  . naproxen sodium (ANAPROX) 220 MG tablet Take 220 mg by mouth as needed.        . pantoprazole (PROTONIX) 40 MG tablet Take 1 tablet (40 mg total) by mouth daily.  30 tablet  11    Past Medical History  Diagnosis Date  . Hypertension   . Tobacco abuse     50 pack year  . Osteopenia   .  Cardiomyopathy     with  a history of congestive heart failure and an EF of 25% in 5/08; nl EF  on the echo in 2011  . Diverticulosis of colon   . Colon polyps     Past Surgical History  Procedure Date  . Cesarean section     2 times  . Ectopic pregnancy surgery   . Breast biopsy   . Cervical fusion   . Carpal tunnel release     right hand  . Colonoscopy 2008    pt uncertain as to whether polypectomy required or performed  . Tonsillectomy     ZOX:WRUEAV of systems complete and found to be negative unless listed above  PHYSICAL EXAM BP 140/70  Pulse 66  Ht 5\' 5"  (1.651 m)  Wt 180 lb 12 oz (81.988 kg)  BMI 30.08 kg/m2  General: Well developed, well nourished, in no acute distress Head: Eyes PERRLA, No xanthomas.   Normal cephalic and atramatic  Lungs: Clear bilaterally to auscultation and percussion. Heart: HRRR S1 S2, without MRG.  Pulses are 2+ & equal.            No carotid bruit. No JVD.  No abdominal bruits. No femoral bruits. Abdomen: Bowel sounds are positive, abdomen soft and non-tender without masses or  Hernia's noted. Msk:  Back normal, normal gait. Normal strength and tone for age. Extremities: No clubbing, cyanosis or edema.  DP +1 Neuro: Alert and oriented X 3. Psych:  Good affect, responds appropriately    ASSESSMENT AND PLAN

## 2012-01-06 ENCOUNTER — Other Ambulatory Visit: Payer: Self-pay | Admitting: Adult Health

## 2012-02-04 ENCOUNTER — Other Ambulatory Visit: Payer: Self-pay | Admitting: Adult Health

## 2012-02-04 ENCOUNTER — Other Ambulatory Visit: Payer: Self-pay | Admitting: Cardiology

## 2012-07-03 ENCOUNTER — Other Ambulatory Visit: Payer: Self-pay | Admitting: Cardiology

## 2012-07-03 ENCOUNTER — Other Ambulatory Visit: Payer: Self-pay | Admitting: Adult Health

## 2012-08-28 ENCOUNTER — Other Ambulatory Visit: Payer: Self-pay | Admitting: Cardiology

## 2012-08-28 ENCOUNTER — Other Ambulatory Visit: Payer: Self-pay | Admitting: Adult Health

## 2012-08-29 ENCOUNTER — Other Ambulatory Visit: Payer: Self-pay | Admitting: *Deleted

## 2012-08-29 ENCOUNTER — Other Ambulatory Visit: Payer: Self-pay

## 2012-08-29 MED ORDER — LISINOPRIL 40 MG PO TABS: ORAL_TABLET | ORAL | Status: DC

## 2012-08-29 MED ORDER — CARVEDILOL 25 MG PO TABS: 25.0000 mg | ORAL_TABLET | Freq: Two times a day (BID) | ORAL | Status: DC

## 2012-08-29 MED ORDER — AMLODIPINE BESYLATE 10 MG PO TABS: ORAL_TABLET | ORAL | Status: DC

## 2012-10-05 ENCOUNTER — Other Ambulatory Visit: Payer: Self-pay | Admitting: Cardiology

## 2012-10-05 NOTE — Telephone Encounter (Signed)
Medication sent via escribe.  

## 2012-11-07 ENCOUNTER — Other Ambulatory Visit: Payer: Self-pay | Admitting: Cardiology

## 2012-12-14 ENCOUNTER — Other Ambulatory Visit: Payer: Self-pay | Admitting: Cardiology

## 2012-12-18 NOTE — Telephone Encounter (Signed)
Patient has not been seen in over a year, needs a follow up appt prior to approving refill. She can be added on to my schedule at any time.

## 2013-01-06 ENCOUNTER — Other Ambulatory Visit: Payer: Self-pay | Admitting: Cardiology

## 2013-01-08 ENCOUNTER — Telehealth: Payer: Self-pay | Admitting: Cardiovascular Disease

## 2013-01-08 MED ORDER — HYDROCHLOROTHIAZIDE 12.5 MG PO CAPS: 12.5000 mg | ORAL_CAPSULE | Freq: Every day | ORAL | Status: DC

## 2013-01-08 NOTE — Telephone Encounter (Signed)
Please call in refill on Hydrochlorathiazide to [facility] / patient has scheduled f/u appt / tgs

## 2013-01-08 NOTE — Telephone Encounter (Signed)
Medication sent via escribe.  

## 2013-01-22 ENCOUNTER — Encounter: Payer: Self-pay | Admitting: Cardiovascular Disease

## 2013-01-22 ENCOUNTER — Ambulatory Visit (INDEPENDENT_AMBULATORY_CARE_PROVIDER_SITE_OTHER): Payer: Medicare Other | Admitting: Cardiovascular Disease

## 2013-01-22 VITALS — BP 123/72 | HR 66 | Ht 64.0 in | Wt 181.0 lb

## 2013-01-22 DIAGNOSIS — R5381 Other malaise: Secondary | ICD-10-CM

## 2013-01-22 DIAGNOSIS — I10 Essential (primary) hypertension: Secondary | ICD-10-CM

## 2013-01-22 DIAGNOSIS — R5383 Other fatigue: Secondary | ICD-10-CM

## 2013-01-22 DIAGNOSIS — I428 Other cardiomyopathies: Secondary | ICD-10-CM

## 2013-01-22 MED ORDER — CARVEDILOL 6.25 MG PO TABS: 6.2500 mg | ORAL_TABLET | Freq: Two times a day (BID) | ORAL | Status: DC

## 2013-01-22 NOTE — Progress Notes (Signed)
Patient ID: Jonathon Bellows, female   DOB: 1940/11/07, 72 y.o.   MRN: [id number]      SUBJECTIVE: Mrs. 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 Her most recent echocardiogram on 10/22/2009 showed normal LV systolic function, EF 55-60%, mild LVH, and grade I diastolic dysfunction. On 08/09/2006, her EF was 20-25%. She has been on carvedilol and lisinopril.  She is feeling very well, other than some lower back pain issues. The patient denies any symptoms of chest pain, palpitations, shortness of breath, lightheadedness, dizziness, leg swelling, orthopnea, PND, and syncope.  She does feel tired by the end of the day and has to take a nap. She says her husband has noticed that she snores, but she denies apneic episodes and morning headaches.   SocHx: Her brother is a Set designer in [place]).     Allergies  Allergen Reactions  . Coreg Cr [Carvedilol] Swelling    Current Outpatient Prescriptions  Medication Sig Dispense Refill  . amLODipine (NORVASC) 10 MG tablet TAKE 1 TABLET BY MOUTH EVERY DAY  90 tablet  1  . carvedilol (COREG) 25 MG tablet TAKE 1 TABLET BY MOUTH TWICE DAILY  60 tablet  0  . furosemide (LASIX) 20 MG tablet TAKE 1 TABLET BY MOUTH EVERY DAY(NEED FOLLOW UP APPT WITH DOCTOR FOR FURTHER REFILLS)  20 tablet  0  . hydrochlorothiazide (MICROZIDE) 12.5 MG capsule Take 1 capsule (12.5 mg total) by mouth daily.  30 capsule  0  . lisinopril (PRINIVIL,ZESTRIL) 40 MG tablet TAKE 1 TABLET BY MOUTH EVERY DAY AS DIRECTED  90 tablet  1  . naproxen sodium (ANAPROX) 220 MG tablet Take 220 mg by mouth as needed.        . pantoprazole (PROTONIX) 40 MG tablet Take 1 tablet (40 mg total) by mouth daily.  30 tablet  11   No current facility-administered medications for this visit.    Past Medical History  Diagnosis Date  . Hypertension   . Tobacco abuse     50 pack year    . Osteopenia   . Cardiomyopathy     with  a history of congestive heart failure and an EF of 25% in 5/08; nl EF  on the echo in 2011  . Diverticulosis of colon   . Colon polyps     Past Surgical History  Procedure Laterality Date  . Cesarean section      2 times  . Ectopic pregnancy surgery    . Breast biopsy    . Cervical fusion    . Carpal tunnel release      right hand  . Colonoscopy  2008    pt uncertain as to whether polypectomy required or performed  . Tonsillectomy      History   Social History  . Marital Status: Married    Spouse Name: N/A    Number of Children: N/A  . Years of Education: N/A   Occupational History  . Former Runner, broadcasting/film/video    Social History Main Topics  . Smoking status: Former Smoker -- 1.00 packs/day for 50 years    Types: Cigarettes  . Smokeless tobacco: Never Used  . Alcohol Use: Not on file  . Drug Use: Not on file  . Sexual Activity: Not on file   Other Topics Concern  . Not on file   Social History Narrative   Married with 2 adult children  BP 123/72 Pulse 66   PHYSICAL EXAM General: NAD Neck: No JVD, no thyromegaly or thyroid nodule.  Lungs: Clear to auscultation bilaterally with normal respiratory effort. CV: Nondisplaced PMI.  Heart regular S1/S2, no S3/S4, no murmur.  No peripheral edema.  No carotid bruit.  Normal pedal pulses.  Abdomen: Soft, nontender, no hepatosplenomegaly, no distention.  Neurologic: Alert and oriented x 3.  Psych: Normal affect. Extremities: No clubbing or cyanosis.   ECG: reviewed and available in electronic records. (sinus rhythm, LAFB)      ASSESSMENT AND PLAN: 1. Hypertensive cardiomyopathy: she is symptomatically stable. Given her fatigue, I will reduce the carvedilol she had been taking at 25 mg q pm to 6.25 mg bid. Given that her LV systolic function is normal, I will switch her daily Lasix to prn for increased leg swelling and/or shortness of breath. 2. HTN: controlled on current  therapy.  Dispo: f/u 1 year.   Prentice Docker, M.D., F.A.C.C.

## 2013-01-22 NOTE — Patient Instructions (Addendum)
Your physician recommends that you schedule a follow-up appointment in: ONE YEAR  Your physician has recommended you make the following change in your medication:   1) DECREASE COREG TO 6.25MG  TWICE DAILY

## 2013-01-23 ENCOUNTER — Telehealth: Payer: Self-pay | Admitting: *Deleted

## 2013-01-23 NOTE — Telephone Encounter (Signed)
.  left message to have patient return my call.  

## 2013-01-23 NOTE — Telephone Encounter (Signed)
Message copied by Ovidio Kin on Tue Jan 23, 2013  5:12 PM ------      Message from: Prentice Docker A      Created: Mon Jan 22, 2013  3:10 PM       Please tell pt that she can also stop taking Lasix daily, and only as needed for increased leg swelling or worsening shortness of breath. ------

## 2013-01-28 ENCOUNTER — Other Ambulatory Visit: Payer: Self-pay | Admitting: Cardiology

## 2013-02-01 NOTE — Telephone Encounter (Signed)
.  left message to have patient return my call.  

## 2013-02-05 NOTE — Telephone Encounter (Signed)
Spoke to pt to advise results/instructions. Pt understood.  

## 2013-03-21 ENCOUNTER — Other Ambulatory Visit: Payer: Self-pay | Admitting: Adult Health

## 2013-03-23 ENCOUNTER — Telehealth: Payer: Self-pay | Admitting: *Deleted

## 2013-03-23 MED ORDER — HYDROCHLOROTHIAZIDE 12.5 MG PO CAPS: 12.5000 mg | ORAL_CAPSULE | Freq: Every day | ORAL | Status: DC

## 2013-03-23 NOTE — Telephone Encounter (Signed)
[facility] [id number] f [id number]  Hydrochlorothiazide 12.5 mg #30

## 2013-03-23 NOTE — Telephone Encounter (Signed)
rx to [facility] hctz 1 yr refills

## 2013-05-28 ENCOUNTER — Other Ambulatory Visit: Payer: Self-pay | Admitting: Cardiology

## 2013-09-28 ENCOUNTER — Other Ambulatory Visit: Payer: Self-pay | Admitting: Cardiology

## 2013-10-29 ENCOUNTER — Other Ambulatory Visit: Payer: Self-pay | Admitting: Adult Health

## 2014-04-12 ENCOUNTER — Other Ambulatory Visit: Payer: Self-pay | Admitting: *Deleted

## 2014-04-12 MED ORDER — HYDROCHLOROTHIAZIDE 12.5 MG PO CAPS: 12.5000 mg | ORAL_CAPSULE | Freq: Every day | ORAL | Status: DC

## 2014-04-29 ENCOUNTER — Other Ambulatory Visit: Payer: Self-pay | Admitting: Cardiovascular Disease

## 2014-04-29 MED ORDER — LISINOPRIL 40 MG PO TABS: 40.0000 mg | ORAL_TABLET | Freq: Every day | ORAL | Status: DC

## 2014-04-29 MED ORDER — AMLODIPINE BESYLATE 10 MG PO TABS: 10.0000 mg | ORAL_TABLET | Freq: Every day | ORAL | Status: DC

## 2014-04-29 NOTE — Telephone Encounter (Signed)
escribed refills per request

## 2014-04-29 NOTE — Telephone Encounter (Signed)
Received fax refill request  Rx # [id number] Medication:  Amlodipine Besylate 10 mg tablets  Qty 90 Sig:  Take one tablet by mouth daily Physician:  Bronson Ing Received fax refill request  Rx # [id number] Medication:  Lisinopril 40 mg tablets Qty 909 Sig:  Take one tablet by mouth every day as directed  Physician:  Bronson Ing

## 2014-05-10 ENCOUNTER — Other Ambulatory Visit: Payer: Self-pay | Admitting: Cardiovascular Disease

## 2014-05-14 ENCOUNTER — Other Ambulatory Visit: Payer: Self-pay | Admitting: Cardiovascular Disease

## 2014-05-14 MED ORDER — FUROSEMIDE 20 MG PO TABS: 20.0000 mg | ORAL_TABLET | Freq: Every day | ORAL | Status: DC

## 2014-05-14 NOTE — Telephone Encounter (Signed)
escribed rx as requested

## 2014-05-14 NOTE — Telephone Encounter (Signed)
Received fax refill request  Rx # [id number] Medication:  Furosemide 20 mg tablets Qty 30 Sig:  Take one tablet by mouth every day Physician:  Bronson Ing

## 2014-05-20 ENCOUNTER — Encounter: Payer: Self-pay | Admitting: Cardiovascular Disease

## 2014-05-20 ENCOUNTER — Ambulatory Visit (INDEPENDENT_AMBULATORY_CARE_PROVIDER_SITE_OTHER): Payer: Medicare Other | Admitting: Cardiovascular Disease

## 2014-05-20 VITALS — BP 116/68 | HR 53 | Ht 64.5 in | Wt 181.0 lb

## 2014-05-20 DIAGNOSIS — I10 Essential (primary) hypertension: Secondary | ICD-10-CM

## 2014-05-20 DIAGNOSIS — I43 Cardiomyopathy in diseases classified elsewhere: Principal | ICD-10-CM

## 2014-05-20 DIAGNOSIS — I429 Cardiomyopathy, unspecified: Secondary | ICD-10-CM

## 2014-05-20 DIAGNOSIS — R5383 Other fatigue: Secondary | ICD-10-CM

## 2014-05-20 DIAGNOSIS — I119 Hypertensive heart disease without heart failure: Secondary | ICD-10-CM

## 2014-05-20 MED ORDER — FUROSEMIDE 20 MG PO TABS: 20.0000 mg | ORAL_TABLET | ORAL | Status: DC | PRN

## 2014-05-20 MED ORDER — CARVEDILOL 12.5 MG PO TABS: 12.5000 mg | ORAL_TABLET | Freq: Two times a day (BID) | ORAL | Status: DC

## 2014-05-20 NOTE — Progress Notes (Signed)
Patient ID: Jola Schmidt, female   DOB: [birth year], 74 y.o.   MRN: [id number]      SUBJECTIVE: Mrs. 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 She presents for follow up of hypertensive cardiomyopathy. Her most recent echocardiogram on 10/22/2009 showed normal LV systolic function, EF 95-62%, mild LVH, and grade I diastolic dysfunction. On 08/09/2006, her EF was 20-25%. She has been on carvedilol and lisinopril. She denies chest pain. She seldom has shortness of breath. She denies significant leg swelling, orthopnea, dizziness, and paroxysmal nocturnal dyspnea. She has felt fatigued. 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   SocHx: Married. 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 Her brother is a Doctor, hospital in [place]). 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  Review of Systems: As per "subjective", otherwise negative.  Allergies  Allergen Reactions  . Coreg Cr [Carvedilol] Swelling    Current Outpatient Prescriptions  Medication Sig Dispense Refill  . amLODipine (NORVASC) 10 MG tablet Take 1 tablet (10 mg total) by mouth daily. 90 tablet 6  . carvedilol (COREG) 25 MG tablet TAKE 1 TABLET BY MOUTH TWICE DAILY WITH A MEAL 180 tablet 3  . carvedilol (COREG) 6.25 MG tablet Take 1 tablet (6.25 mg total) by mouth 2 (two) times daily. 180 tablet 3  . furosemide (LASIX) 20 MG tablet Take 1 tablet (20 mg total) by mouth daily. 30 tablet 6  . hydrochlorothiazide (MICROZIDE) 12.5 MG capsule TAKE 1 CAPSULE BY MOUTH DAILY 30 capsule 11  . lisinopril (PRINIVIL,ZESTRIL) 40 MG tablet Take 1 tablet (40 mg total) by mouth daily. 90 tablet 6   No current facility-administered medications for this visit.    Past Medical History  Diagnosis Date  . Hypertension   . Tobacco abuse     50 pack year  . Osteopenia   .  Cardiomyopathy     with  a history of congestive heart failure and an EF of 25% in 5/08; nl EF  on the echo in 2011  . Diverticulosis of colon   . Colon polyps     Past Surgical History  Procedure Laterality Date  . Cesarean section      2 times  . Ectopic pregnancy surgery    . Breast biopsy    . Cervical fusion    . Carpal tunnel release      right hand  . Colonoscopy  1308    pt uncertain as to whether polypectomy required or performed  . Tonsillectomy      History   Social History  . Marital Status: Married    Spouse Name: N/A  . Number of Children: N/A  . Years of Education: N/A   Occupational History  . Former Pharmacist, hospital    Social History Main Topics  . Smoking status: Former Smoker -- 1.00 packs/day for 50 years    Types: Cigarettes  . Smokeless tobacco: Never Used  . Alcohol Use: Not on file  . Drug Use: Not on file  . Sexual Activity: Not on file   Other Topics Concern  . Not on file   Social History Narrative   Married with 2 adult children    BP 116/68  Pulse 53  SpO2 95%  Weight 181 lb (82.101 kg) Height 5' 4.5" (1.638 m)    PHYSICAL EXAM General: NAD HEENT: Normal. Neck: No  JVD, no thyromegaly. Lungs: Clear to auscultation bilaterally with normal respiratory effort. CV: Nondisplaced PMI.  Regular rate and rhythm, normal S1/S2, no S3/S4, no murmur. No pretibial or periankle edema.  No carotid bruit.  Normal pedal pulses.  Abdomen: Soft, nontender, no hepatosplenomegaly, no distention.  Neurologic: Alert and oriented x 3.  Psych: Normal affect. Skin: Normal. Musculoskeletal: Normal range of motion, no gross deformities. Extremities: No clubbing or cyanosis.   ECG: Most recent ECG reviewed.      ASSESSMENT AND PLAN: 1. Hypertensive cardiomyopathy: She remains symptomatically stable. Given her fatigue, I will reduce Coreg to 12.5 mg bid and continue lisinopril. I will have her take Lasix prn for increased leg swelling and/or shortness of  breath.  2. Essential hypertension: Well controlled on current therapy. No changes indicated. Continue to monitor given reduction of Coreg dose.  Dispo: f/u 1 year.   Kate Sable, M.D., F.A.C.C.

## 2014-05-20 NOTE — Patient Instructions (Signed)
Your physician wants you to follow-up in: 1 year with Dr. Bronson Ing. You will receive a reminder letter in the mail two months in advance. If you don't receive a letter, please call our office to schedule the follow-up appointment.  Your physician has recommended you make the following change in your medication:   Take Lasix as needed for swelling   Decrease Coreg to 12.5 mg two times daily  Thank you for choosing [facility]!

## 2015-01-06 ENCOUNTER — Other Ambulatory Visit: Payer: Self-pay | Admitting: Cardiovascular Disease

## 2015-01-28 NOTE — Telephone Encounter (Signed)
Refill denied as this is an old request.  Patient was given # 30 with 11 refills on 05/20/2014 which should carry her for the year.  Recall has been added to EPIC for 04/2015 for [place] - Dr. Bronson Ing.  Recall was based off of last OV dictation.

## 2015-06-06 ENCOUNTER — Other Ambulatory Visit: Payer: Self-pay | Admitting: Cardiovascular Disease

## 2015-06-21 ENCOUNTER — Other Ambulatory Visit: Payer: Self-pay | Admitting: Cardiovascular Disease

## 2015-07-04 ENCOUNTER — Encounter: Payer: Self-pay | Admitting: Adult Health

## 2015-07-04 ENCOUNTER — Ambulatory Visit (INDEPENDENT_AMBULATORY_CARE_PROVIDER_SITE_OTHER): Payer: Medicare Other | Admitting: Adult Health

## 2015-07-04 VITALS — BP 106/66 | HR 80 | Ht 64.5 in | Wt 175.0 lb

## 2015-07-04 DIAGNOSIS — I10 Essential (primary) hypertension: Secondary | ICD-10-CM

## 2015-07-04 DIAGNOSIS — I420 Dilated cardiomyopathy: Secondary | ICD-10-CM | POA: Diagnosis not present

## 2015-07-04 MED ORDER — FUROSEMIDE 20 MG PO TABS: 20.0000 mg | ORAL_TABLET | Freq: Every day | ORAL | Status: DC

## 2015-07-04 MED ORDER — HYDROCHLOROTHIAZIDE 12.5 MG PO CAPS: 12.5000 mg | ORAL_CAPSULE | Freq: Every day | ORAL | Status: DC

## 2015-07-04 MED ORDER — CARVEDILOL 12.5 MG PO TABS: 12.5000 mg | ORAL_TABLET | Freq: Two times a day (BID) | ORAL | Status: DC

## 2015-07-04 MED ORDER — AMLODIPINE BESYLATE 10 MG PO TABS: 10.0000 mg | ORAL_TABLET | Freq: Every day | ORAL | Status: DC

## 2015-07-04 MED ORDER — LISINOPRIL 40 MG PO TABS: 40.0000 mg | ORAL_TABLET | Freq: Every day | ORAL | Status: DC

## 2015-07-04 NOTE — Progress Notes (Signed)
Cardiology Office Note   Date:  07/04/2015   ID:  Levie Swinehart, DOB 1940/08/09, MRN [id number]  PCP:  Maggie Font, MD  Cardiologist: Woodroe Chen, NP   Chief Complaint  Patient presents with  . Hypertension      History of Present Illness: Brandy [name] is a 75 y.o. female who presents for ongoing assessment and management of hypertensive cardiomyopathy,and hypertension.  The patient was last seen by Dr. Bronson Ing in February 2016, and was stable from a cardiology standpoint.  Due to fatigue.  However, she had Coreg decreased to 12.5 mg twice a day and was to continue lisinopril.  Lasix was to be taken when necessary for lower extremity edema and/or shortness of breath.  She comes today with out cardiac complaints.  She does have chronic lower back pain and arthritis pain.  She has been medically compliant.  She has seen her primary care physician, Dr. Berdine Addison, and has had lab work completed within the last 2 weeks.  Past Medical History  Diagnosis Date  . Hypertension   . Tobacco abuse     50 pack year  . Osteopenia   . Cardiomyopathy     with  a history of congestive heart failure and an EF of 25% in 5/08; nl EF  on the echo in 2011  . Diverticulosis of colon   . Colon polyps     Past Surgical History  Procedure Laterality Date  . Cesarean section      2 times  . Ectopic pregnancy surgery    . Breast biopsy    . Cervical fusion    . Carpal tunnel release      right hand  . Colonoscopy  [id number]    pt uncertain as to whether polypectomy required or performed  . Tonsillectomy       Current Outpatient Prescriptions  Medication Sig Dispense Refill  . amLODipine (NORVASC) 10 MG tablet TAKE 1 TABLET BY MOUTH EVERY DAY 30 tablet 0  . carvedilol (COREG) 12.5 MG tablet TAKE 1 TABLET BY MOUTH TWICE DAILY 60 tablet 0  . cetirizine (ZYRTEC) 10 MG tablet Take 10 mg by mouth daily.    . furosemide (LASIX) 20 MG tablet Take 1 tablet (20 mg total) by mouth as needed  for edema. 30 tablet 6  . furosemide (LASIX) 20 MG tablet TAKE 1 TABLET BY MOUTH EVERY DAY 30 tablet 6  . hydrochlorothiazide (MICROZIDE) 12.5 MG capsule TAKE ONE CAPSULE BY MOUTH EVERY DAY 30 capsule 0  . lisinopril (PRINIVIL,ZESTRIL) 40 MG tablet TAKE 1 TABLET BY MOUTH DAILY 30 tablet 0  . Naproxen Sodium (ALEVE PO) Take 1 capsule by mouth as needed.     No current facility-administered medications for this visit.    Allergies:   Coreg cr    Social History:  The patient  reports that she quit smoking about 7 years ago. Her smoking use included Cigarettes. She started smoking about 54 years ago. She has a 50 pack-year smoking history. She has never used smokeless tobacco. She reports that she does not drink alcohol.   Family History:  The patient's family history is not on file.    ROS: All other systems are reviewed and negative. Unless otherwise mentioned in H&P    PHYSICAL EXAM: VS:  BP 106/66 mmHg  Pulse 80  Ht 5' 4.5" (1.638 m)  Wt 175 lb (79.379 kg)  BMI 29.59 kg/m2  SpO2 94% , BMI Body mass index is 29.59 kg/(m^2). GEN:  Well nourished, well developed, in no acute distress HEENT: normal Neck: no JVD, carotid bruits, or masses Cardiac: RRR; no murmurs, rubs, or gallops,no edema  Respiratory:  clear to auscultation bilaterally, normal work of breathing GI: soft, nontender, nondistended, + BS MS: no deformity or atrophy Skin: warm and dry, no rash Neuro:  Strength and sensation are intact Psych: euthymic mood, full affect   EKG: The ekg ordered today demonstrates normal sinus rhythm, rate of 72 beats per minute.   Recent Labs: No results found for requested labs within last 365 days.    Lipid Panel No results found for: CHOL, TRIG, HDL, CHOLHDL, VLDL, LDLCALC, LDLDIRECT    Wt Readings from Last 3 Encounters:  07/04/15 175 lb (79.379 kg)  05/20/14 181 lb (82.101 kg)  01/22/13 181 lb (82.101 kg)      ASSESSMENT AND PLAN:  1.  Hypertensive heart  disease:blood pressure is well-controlled.  She is advised on only taking Lasix when necessary to avoid dizziness and significant hypertension.she will continue carvedilol, and amlodipine.  She has a soft blood pressure 106/66.  She states she has chronic lower extremity edema, which I have explained may be related to the amlodipine and Motrin, which she takes for her arthritis.  I have advised her to be careful concerning the amount of diuretics.  She is taking to prevent dizziness, or falls.  2. Hypertension: as stated above, blood pressure is controlled.we will get copies of recent lab work from her primary care physician to evaluate kidney function, and she is on 2 diuretics.   Current medicines are reviewed at length with the patient today.    Labs/ tests ordered today include: requested from primary care.  Orders Placed This Encounter  Procedures  . EKG 12-Lead     Disposition:   FU with 6 months. Signed, Jory Sims, NP  07/04/2015 3:39 PM    [facility] [address] [place], [place] [address] Phone: [phone]; Fax: [phone]

## 2015-07-04 NOTE — Progress Notes (Signed)
Name: Brandy [name]    DOB: 08/28/1940  Age: 75 y.o.  MR#: [id number]       PCP:  Maggie Font, MD      Insurance: Payor: Onnie Boer MEDICARE / Plan: [facility] MEDICARE / Product Type: *No Product type* /   CC:    Chief Complaint  Patient presents with  . Hypertension    VS Filed Vitals:   07/04/15 1514  BP: 106/66  Pulse: 80  Height: 5' 4.5" (1.638 m)  Weight: 175 lb (79.379 kg)  SpO2: 94%    Weights Current Weight  07/04/15 175 lb (79.379 kg)  05/20/14 181 lb (82.101 kg)  01/22/13 181 lb (82.101 kg)    Blood Pressure  BP Readings from Last 3 Encounters:  07/04/15 106/66  05/20/14 116/68  01/22/13 123/72     Admit date:  (Not on file) Last encounter with RMR:  Visit date not found   Allergy Coreg cr  Current Outpatient Prescriptions  Medication Sig Dispense Refill  . amLODipine (NORVASC) 10 MG tablet TAKE 1 TABLET BY MOUTH EVERY DAY 30 tablet 0  . carvedilol (COREG) 12.5 MG tablet TAKE 1 TABLET BY MOUTH TWICE DAILY 60 tablet 0  . cetirizine (ZYRTEC) 10 MG tablet Take 10 mg by mouth daily.    . furosemide (LASIX) 20 MG tablet Take 1 tablet (20 mg total) by mouth as needed for edema. 30 tablet 6  . furosemide (LASIX) 20 MG tablet TAKE 1 TABLET BY MOUTH EVERY DAY 30 tablet 6  . hydrochlorothiazide (MICROZIDE) 12.5 MG capsule TAKE ONE CAPSULE BY MOUTH EVERY DAY 30 capsule 0  . lisinopril (PRINIVIL,ZESTRIL) 40 MG tablet TAKE 1 TABLET BY MOUTH DAILY 30 tablet 0  . Naproxen Sodium (ALEVE PO) Take 1 capsule by mouth as needed.     No current facility-administered medications for this visit.    Discontinued Meds:   There are no discontinued medications.  Patient Active Problem List   Diagnosis Date Noted  . HTN (hypertension) 01/22/2013  . TOBACCO ABUSE 02/18/2008  . CARDIOMYOPATHY 02/18/2008  . DIVERTICULOSIS OF COLON 02/18/2008  . OSTEOPENIA 02/18/2008    LABS    Component Value Date/Time   NA 143 10/23/2008 2107   NA 143 07/21/2006 1123   K 4.7  10/23/2008 2107   K 3.8 07/21/2006 1123   CL 108 10/23/2008 2107   CL 103 07/21/2006 1123   CO2 28 10/23/2008 2107   CO2 33* 07/21/2006 1123   GLUCOSE 79 10/23/2008 2107   GLUCOSE 68* 07/21/2006 1123   BUN 21 10/23/2008 2107   BUN 13 07/21/2006 1123   CREATININE 0.91 10/23/2008 2107   CREATININE 0.9 07/21/2006 1123   CALCIUM 9.3 10/23/2008 2107   CALCIUM 9.2 07/21/2006 1123   GFRNONAA 67 07/21/2006 1123   GFRAA 81 07/21/2006 1123   CMP     Component Value Date/Time   NA 143 10/23/2008 2107   K 4.7 10/23/2008 2107   CL 108 10/23/2008 2107   CO2 28 10/23/2008 2107   GLUCOSE 79 10/23/2008 2107   BUN 21 10/23/2008 2107   CREATININE 0.91 10/23/2008 2107   CALCIUM 9.3 10/23/2008 2107   PROT 6.8 10/23/2008 2107   ALBUMIN 4.1 10/23/2008 2107   AST 17 10/23/2008 2107   ALT 11 10/23/2008 2107   ALKPHOS 76 10/23/2008 2107   BILITOT 0.2* 10/23/2008 2107   GFRNONAA 67 07/21/2006 1123   GFRAA 81 07/21/2006 1123       Component Value Date/Time   WBC  11.5* 07/21/2006 1123   HGB 14.5 07/21/2006 1123   HCT 43.1 07/21/2006 1123   MCV 89.0 07/21/2006 1123    Lipid Panel  No results found for: CHOL, TRIG, HDL, CHOLHDL, VLDL, LDLCALC, LDLDIRECT  ABG No results found for: PHART, PCO2ART, PO2ART, HCO3, TCO2, ACIDBASEDEF, O2SAT   No results found for: TSH BNP (last 3 results) No results for input(s): BNP in the last 8760 hours.  ProBNP (last 3 results) No results for input(s): PROBNP in the last 8760 hours.  Cardiac Panel (last 3 results) No results for input(s): CKTOTAL, CKMB, TROPONINI, RELINDX in the last 72 hours.  Iron/TIBC/Ferritin/ %Sat No results found for: IRON, TIBC, FERRITIN, IRONPCTSAT   EKG Orders placed or performed in visit on 07/04/15  . EKG 12-Lead     Prior Assessment and Plan Problem List as of 07/04/2015      Cardiovascular and Mediastinum   CARDIOMYOPATHY   Last Assessment & Plan 12/03/2011 Office Visit Written 12/03/2011  1:39 PM by Lendon Colonel,  NP    The patient will stop the Coreg CR, and she is currently doing. She will return back to Coreg 25 mg twice a day and she was taking before and tolerated well. Her main complaint is fatigue associated with this medication but she is willing to continue to take it as before to avoid any recurrence of symptoms with the long-acting Coreg. We will add Coreg CR to her allergy list. The patient will return in 6 months unless she becomes symptomatic.      HTN (hypertension)     Digestive   DIVERTICULOSIS OF COLON     Musculoskeletal and Integument   OSTEOPENIA   Last Assessment & Plan 11/15/2011 Office Visit Written 11/15/2011  6:11 PM by Lendon Colonel, NP    She complains of pain in her neck and lower back. She will followup with primary care physician for this. However with GERD symptoms and use of naproxen sodium, I provided her with a prescription for protonix 40 mg daily to assist with these symptoms which may be related to NSAID use. If this medication does indeed work for her she should discuss this with her primary care physician, for continued prescription refills. I have advised her that it may take a couple days for her to feel improvement of her symptoms on protonix.        Other   TOBACCO ABUSE       Imaging: No results found.

## 2015-07-04 NOTE — Patient Instructions (Signed)

## 2015-07-23 ENCOUNTER — Other Ambulatory Visit: Payer: Self-pay | Admitting: Cardiovascular Disease

## 2015-08-28 ENCOUNTER — Other Ambulatory Visit: Payer: Self-pay | Admitting: Cardiovascular Disease

## 2015-10-18 ENCOUNTER — Other Ambulatory Visit: Payer: Self-pay | Admitting: Adult Health

## 2015-12-11 ENCOUNTER — Other Ambulatory Visit: Payer: Self-pay | Admitting: Adult Health

## 2016-04-16 ENCOUNTER — Other Ambulatory Visit: Payer: Self-pay | Admitting: Adult Health

## 2016-05-23 ENCOUNTER — Other Ambulatory Visit: Payer: Self-pay | Admitting: Adult Health

## 2016-05-24 ENCOUNTER — Other Ambulatory Visit: Payer: Self-pay | Admitting: Adult Health

## 2016-05-24 DIAGNOSIS — H2513 Age-related nuclear cataract, bilateral: Secondary | ICD-10-CM | POA: Insufficient documentation

## 2016-05-28 ENCOUNTER — Ambulatory Visit: Payer: Medicare Other | Admitting: Adult Health

## 2016-05-28 ENCOUNTER — Encounter: Payer: Self-pay | Admitting: Adult Health

## 2016-05-28 NOTE — Progress Notes (Deleted)
Cardiology Office Note   Date:  05/28/2016   ID:  Brandy [name], DOB 1940-12-30, MRN [id number]  PCP:  Maggie Font, MD  Cardiologist:Koneswaran/   Jory Sims, NP   No chief complaint on file.     History of Present Illness: Brandy [name] is a 76 y.o. female who presents for n ongoing assessment and management of hypertensive cardiomyopathy, and hypertension. The patient was last seen in the office on 07/04/2015 and was without cardiac complaints at that time. She was to take Lasix when necessary for lower extremity edema. To avoid dizziness and significant hypotension. She was continued on her current medication regimen. She has chronic lower extremity edema related to NSAIDs taken for arthritis. Follow-up BMET was ordered and was to be completed by primary care physician that she was seeing shortly after that appointment. It was noted that she was on 2 diuretics, HCTZ and furosemide.    Past Medical History:  Diagnosis Date  . Cardiomyopathy    with  a history of congestive heart failure and an EF of 25% in 5/08; nl EF  on the echo in 2011  . Colon polyps   . Diverticulosis of colon   . Hypertension   . Osteopenia   . Tobacco abuse    50 pack year    Past Surgical History:  Procedure Laterality Date  . BREAST BIOPSY    . CARPAL TUNNEL RELEASE     right hand  . CERVICAL FUSION    . CESAREAN SECTION     2 times  . COLONOSCOPY  [id number]   pt uncertain as to whether polypectomy required or performed  . ECTOPIC PREGNANCY SURGERY    . TONSILLECTOMY       Current Outpatient Prescriptions  Medication Sig Dispense Refill  . amLODipine (NORVASC) 10 MG tablet TAKE 1 TABLET BY MOUTH EVERY DAY 30 tablet 0  . carvedilol (COREG) 12.5 MG tablet TAKE 1 TABLET BY MOUTH TWICE DAILY 60 tablet 0  . cetirizine (ZYRTEC) 10 MG tablet Take 10 mg by mouth daily.    . furosemide (LASIX) 20 MG tablet Take 1 tablet (20 mg total) by mouth daily. 30 tablet 6  . furosemide (LASIX) 20 MG tablet  TAKE 1 TABLET BY MOUTH EVERY DAY 30 tablet 3  . hydrochlorothiazide (MICROZIDE) 12.5 MG capsule Take 1 capsule (12.5 mg total) by mouth daily. 30 capsule 3  . hydrochlorothiazide (MICROZIDE) 12.5 MG capsule TAKE ONE CAPSULE BY MOUTH EVERY DAY 30 capsule 6  . lisinopril (PRINIVIL,ZESTRIL) 40 MG tablet TAKE 1 TABLET BY MOUTH EVERY DAY 30 tablet 0  . Naproxen Sodium (ALEVE PO) Take 1 capsule by mouth as needed.     No current facility-administered medications for this visit.     Allergies:   Coreg cr [carvedilol]    Social History:  The patient  reports that she quit smoking about 8 years ago. Her smoking use included Cigarettes. She started smoking about 55 years ago. She has a 50.00 pack-year smoking history. She has never used smokeless tobacco. She reports that she does not drink alcohol.   Family History:  The patient's family history is not on file.    ROS: All other systems are reviewed and negative. Unless otherwise mentioned in H&P    PHYSICAL EXAM: VS:  There were no vitals taken for this visit. , BMI There is no height or weight on file to calculate BMI. GEN: Well nourished, well developed, in no acute distress HEENT: normal Neck: no JVD,  carotid bruits, or masses Cardiac: ***RRR; no murmurs, rubs, or gallops,no edema  Respiratory:  clear to auscultation bilaterally, normal work of breathing GI: soft, nontender, nondistended, + BS MS: no deformity or atrophy Skin: warm and dry, no rash Neuro:  Strength and sensation are intact Psych: euthymic mood, full affect   EKG:  EKG {ACTION; IS/IS [id number] ordered today. The ekg ordered today demonstrates ***   Recent Labs: No results found for requested labs within last 8760 hours.    Lipid Panel No results found for: CHOL, TRIG, HDL, CHOLHDL, VLDL, LDLCALC, LDLDIRECT    Wt Readings from Last 3 Encounters:  07/04/15 175 lb (79.4 kg)  05/20/14 181 lb (82.1 kg)  01/22/13 181 lb (82.1 kg)      Other studies  Reviewed: Additional studies/ records that were reviewed today include: ***. Review of the above records demonstrates: ***   ASSESSMENT AND PLAN:  1.  ***   Current medicines are reviewed at length with the patient today.    Labs/ tests ordered today include: *** No orders of the defined types were placed in this encounter.    Disposition:   FU with *** in {gen number [id number] {TIME; UNITS DAY/WEEK/MONTH:19136}   Signed, Jory Sims, NP  05/28/2016 7:15 AM    [facility]. [address] [place], [place] [address] Phone: [phone]; Fax: [phone]

## 2016-06-10 ENCOUNTER — Ambulatory Visit: Payer: Medicare Other | Admitting: Adult Health

## 2016-06-20 ENCOUNTER — Encounter: Payer: Self-pay | Admitting: Physician Assistant

## 2016-06-20 DIAGNOSIS — I119 Hypertensive heart disease without heart failure: Secondary | ICD-10-CM | POA: Insufficient documentation

## 2016-06-20 DIAGNOSIS — I10 Essential (primary) hypertension: Secondary | ICD-10-CM | POA: Insufficient documentation

## 2016-06-20 NOTE — Progress Notes (Signed)
Cardiology Office Note    Date:  06/22/2016  ID:  Brandy [name], DOB 1941/03/06, MRN [id number] PCP:  Maggie Font, MD  Cardiologist:  Dr. Bronson Ing   Chief Complaint: f/u blood pressure and heart failure  History of Present Illness:  Brandy [name] is a 76 y.o. female with history of HTN, tobacco abuse, cardiomyopathy, hypertensive heart disease, diverticulosis who presents for overdue 6 month f/u. Per chart review, remote echo 2008 showed EF 20-25%; interim echoes have shown improvement since then with echo 2011 showing mild LVH, EF 55-60%, grade 1 DD, cannot exclude PFO, PASP 37. There is remote reference in 2009 to a nuclear stress test showing EF 37%, equivocal with possible scar in distribution of mid distal LAD or breast attenuation artifact, no ischemia. Last labs from 2010 showed K 4.7, Cr 0.91. No recent labs beyond this.  She presents back to clinic for 6 month follow-up. Overall she reports excellent blood pressure control with home readings in the 125/70s range. She says today's reading is in fact slightly higher than normal for her. She does notice intermittent edema, worse at the end of the day. She also reports chronic DOE with heavier exertion, unchanged recently. She does not exercise or watch salt intake. She has been struggling with arthritis and following with Dr. Charlestine Night for this. She feels better when she is on prednisone but does not wish to use this chronically due to risk of plethora.    Past Medical History:  Diagnosis Date  . Cardiomyopathy    with  a history of congestive heart failure and an EF of 25% in 5/08; nl EF  on the echo in 2011  . Chronic combined systolic and diastolic CHF (congestive heart failure) ([place])   . Colon polyps   . Diverticulosis of colon   . Hypertension   . Hypertensive heart disease   . Osteopenia   . Tobacco abuse    50 pack year    Past Surgical History:  Procedure Laterality Date  . BREAST BIOPSY    . CARPAL TUNNEL RELEASE      right hand  . CERVICAL FUSION    . CESAREAN SECTION     2 times  . COLONOSCOPY  3536   pt uncertain as to whether polypectomy required or performed  . ECTOPIC PREGNANCY SURGERY    . TONSILLECTOMY      Current Medications: Current Outpatient Prescriptions  Medication Sig Dispense Refill  . amLODipine (NORVASC) 10 MG tablet TAKE 1 TABLET BY MOUTH EVERY DAY 30 tablet 0  . carvedilol (COREG) 6.25 MG tablet Take 6.25 mg by mouth 2 (two) times daily with a meal.    . cetirizine (ZYRTEC) 10 MG tablet Take 10 mg by mouth daily.    . furosemide (LASIX) 20 MG tablet Take 1 tablet (20 mg total) by mouth daily. 30 tablet 6  . hydrochlorothiazide (MICROZIDE) 12.5 MG capsule Take 1 capsule (12.5 mg total) by mouth daily. 30 capsule 3  . lisinopril (PRINIVIL,ZESTRIL) 40 MG tablet TAKE 1 TABLET BY MOUTH EVERY DAY 30 tablet 0  . Naproxen Sodium (ALEVE PO) Take 1 capsule by mouth as needed.     No current facility-administered medications for this visit.      Allergies:   Coreg cr [carvedilol] and Lisinopril   Social History   Social History  . Marital status: Married    Spouse name: N/A  . Number of children: N/A  . Years of education: N/A   Occupational History  .  Former Pharmacist, hospital Retired   Social History Main Topics  . Smoking status: Former Smoker    Packs/day: 1.00    Years: 50.00    Types: Cigarettes    Start date: 09/02/1960    Quit date: 03/29/2008  . Smokeless tobacco: Never Used  . Alcohol use No  . Drug use: Unknown  . Sexual activity: Not Currently   Other Topics Concern  . None   Social History Narrative   Married with 2 adult children     Family History:  Family History  Problem Relation Age of Onset  . Hypertension Mother      ROS:   Please see the history of present illness. No chest pain, palpitations, syncope reported. All other systems are reviewed and otherwise negative.    PHYSICAL EXAM:   VS:  BP 136/78   Pulse 69   Ht 5' 4.5" (1.638 m)   Wt  180 lb (81.6 kg)   SpO2 99%   BMI 30.42 kg/m   BMI: Body mass index is 30.42 kg/m. GEN: Well nourished, well developed overweight AAF, in no acute distress  HEENT: normocephalic, atraumatic Neck: no JVD, carotid bruits, or masses Cardiac: RRR; no murmurs, rubs, or gallops, trace dependent-appearing edema, soft and puffy, nonpitting Respiratory:  clear to auscultation bilaterally, normal work of breathing GI: soft, nontender, nondistended, + BS MS: no deformity or atrophy  Skin: warm and dry, no rash Neuro:  Alert and Oriented x 3, Strength and sensation are intact, follows commands Psych: euthymic mood, full affect  Wt Readings from Last 3 Encounters:  06/22/16 180 lb (81.6 kg)  07/04/15 175 lb (79.4 kg)  05/20/14 181 lb (82.1 kg)      Studies/Labs Reviewed:   EKG:  EKG was ordered today and personally reviewed by me and demonstrates NSR 67bpm, prior anterior infarct, prior inferior infarct, similar prior  Recent Labs: No results found for requested labs within last 8760 hours.   Lipid Panel No results found for: CHOL, TRIG, HDL, CHOLHDL, VLDL, LDLCALC, LDLDIRECT  Additional studies/ records that were reviewed today include: Summarized above.    ASSESSMENT & PLAN:   1. Hypertensive heart disease with chronic combined CHF - overall appears controlled. She does report fluctuating dependent edema and inquires about whether it could be medication related. I suspect this may improve if we decrease amlodipine and increase HCTZ. However, I do not have any recent labwork on her since 2010. Will check BMET and BNP. The patient has an appointment after this and says she will return for these tomorrow. Will make further med recs once these are available. Will also request a copy of the labs she had by her PCP last month. Reviewed low sodium diet with patient. I advised her to monitor BP and call if tending to run >161-096 systolic from here on out. 2. Shortness of breath - chronic,  unchanged over many years' time. She does not exercise. There is no acute component to this so I suspect deconditioning. I have encouraged regular physical activity as she is able, warning her that some arthritis patients tend to go down a rabbit hole of less activity -> more SOB -> less activity -> more SOB, etc. 3. Essential HTN - remains controlled at this time. Follow with anticipated med changes.  4. Tobacco abuse - former user. Could also be contributing to chronic dyspnea.  Disposition: F/u with Dr. Bronson Ing or Jory Sims NP in 6 months.   Medication Adjustments/Labs and Tests Ordered: Current medicines are  reviewed at length with the patient today.  Concerns regarding medicines are outlined above. Medication changes, Labs and Tests ordered today are summarized above and listed in the Patient Instructions accessible in Encounters.   Raechel Ache PA-C  06/22/2016 3:00 PM    [facility] Location in [place]. [place], [place] [address] Ph: [phone]; Fax [phone]

## 2016-06-22 ENCOUNTER — Ambulatory Visit (INDEPENDENT_AMBULATORY_CARE_PROVIDER_SITE_OTHER): Payer: Medicare Other | Admitting: Physician Assistant

## 2016-06-22 ENCOUNTER — Encounter: Payer: Self-pay | Admitting: Physician Assistant

## 2016-06-22 VITALS — BP 136/78 | HR 69 | Ht 64.5 in | Wt 180.0 lb

## 2016-06-22 DIAGNOSIS — I10 Essential (primary) hypertension: Secondary | ICD-10-CM | POA: Diagnosis not present

## 2016-06-22 DIAGNOSIS — I5042 Chronic combined systolic (congestive) and diastolic (congestive) heart failure: Secondary | ICD-10-CM

## 2016-06-22 DIAGNOSIS — I428 Other cardiomyopathies: Secondary | ICD-10-CM

## 2016-06-22 DIAGNOSIS — R609 Edema, unspecified: Secondary | ICD-10-CM | POA: Diagnosis not present

## 2016-06-22 DIAGNOSIS — R0602 Shortness of breath: Secondary | ICD-10-CM

## 2016-06-22 DIAGNOSIS — I110 Hypertensive heart disease with heart failure: Secondary | ICD-10-CM

## 2016-06-22 MED ORDER — LISINOPRIL 40 MG PO TABS: 40.0000 mg | ORAL_TABLET | Freq: Every day | ORAL | 3 refills | Status: DC

## 2016-06-22 MED ORDER — HYDROCHLOROTHIAZIDE 12.5 MG PO CAPS: 12.5000 mg | ORAL_CAPSULE | Freq: Every day | ORAL | 3 refills | Status: DC

## 2016-06-22 MED ORDER — CARVEDILOL 6.25 MG PO TABS: 6.2500 mg | ORAL_TABLET | Freq: Two times a day (BID) | ORAL | 3 refills | Status: DC

## 2016-06-22 MED ORDER — FUROSEMIDE 20 MG PO TABS: 20.0000 mg | ORAL_TABLET | Freq: Every day | ORAL | 3 refills | Status: DC

## 2016-06-22 NOTE — Addendum Note (Signed)
Addended by: Levonne Hubert on: 06/22/2016 04:42 PM   Modules accepted: Orders

## 2016-06-22 NOTE — Patient Instructions (Signed)
Your physician wants you to follow-up in: 6 Months with Dr. Bronson Ing. You will receive a reminder letter in the mail two months in advance. If you don't receive a letter, please call our office to schedule the follow-up appointment.  Your physician recommends that you continue on your current medications as directed. Please refer to the Current Medication list given to you today.  Your physician recommends that you return for lab work.   Call if Blood pressure ( the top number is running) over 130-135 routinely   If you need a refill on your cardiac medications before your next appointment, please call your pharmacy.  Thank you for choosing [facility]!

## 2016-07-09 DIAGNOSIS — I5023 Acute on chronic systolic (congestive) heart failure: Secondary | ICD-10-CM | POA: Insufficient documentation

## 2016-07-09 DIAGNOSIS — I5042 Chronic combined systolic (congestive) and diastolic (congestive) heart failure: Secondary | ICD-10-CM | POA: Insufficient documentation

## 2016-08-06 DIAGNOSIS — Z961 Presence of intraocular lens: Secondary | ICD-10-CM | POA: Insufficient documentation

## 2017-04-13 ENCOUNTER — Encounter (INDEPENDENT_AMBULATORY_CARE_PROVIDER_SITE_OTHER): Payer: Self-pay

## 2017-05-04 ENCOUNTER — Ambulatory Visit: Payer: Medicare Other | Admitting: Cardiovascular Disease

## 2017-05-04 ENCOUNTER — Encounter: Payer: Self-pay | Admitting: Cardiovascular Disease

## 2017-05-04 ENCOUNTER — Other Ambulatory Visit: Payer: Self-pay | Admitting: *Deleted

## 2017-05-04 ENCOUNTER — Other Ambulatory Visit: Payer: Self-pay

## 2017-05-04 VITALS — BP 120/86 | HR 86 | Ht 64.5 in | Wt 172.2 lb

## 2017-05-04 DIAGNOSIS — I119 Hypertensive heart disease without heart failure: Secondary | ICD-10-CM | POA: Diagnosis not present

## 2017-05-04 DIAGNOSIS — R5383 Other fatigue: Secondary | ICD-10-CM

## 2017-05-04 DIAGNOSIS — Z7182 Exercise counseling: Secondary | ICD-10-CM | POA: Diagnosis not present

## 2017-05-04 DIAGNOSIS — R0602 Shortness of breath: Secondary | ICD-10-CM | POA: Diagnosis not present

## 2017-05-04 DIAGNOSIS — R0600 Dyspnea, unspecified: Secondary | ICD-10-CM | POA: Insufficient documentation

## 2017-05-04 DIAGNOSIS — I10 Essential (primary) hypertension: Secondary | ICD-10-CM | POA: Diagnosis not present

## 2017-05-04 DIAGNOSIS — I428 Other cardiomyopathies: Secondary | ICD-10-CM | POA: Diagnosis not present

## 2017-05-04 NOTE — Patient Instructions (Addendum)
Medication Instructions:   Your physician recommends that you continue on your current medications as directed. Please refer to the Current Medication list given to you today.  Labwork:  Your physician recommends that you return for lab work today to check your vitamin D level.  Testing/Procedures: Your physician has requested that you have an echocardiogram. Echocardiography is a painless test that uses sound waves to create images of your heart. It provides your doctor with information about the size and shape of your heart and how well your heart's chambers and valves are working. This procedure takes approximately one hour. There are no restrictions for this procedure.  Follow-Up:  Your physician recommends that you schedule a follow-up appointment in: 1 year. You will receive a reminder letter in the mail in about 10 months reminding you to call and schedule your appointment. If you don't receive this letter, please contact our office.  Any Other Special Instructions Will Be Listed Below (If Applicable).  You have been referred to a neurologist so that you can have a sleep study.   If you need a refill on your cardiac medications before your next appointment, please call your pharmacy.

## 2017-05-04 NOTE — Progress Notes (Signed)
SUBJECTIVE: The patient presents for routine follow-up.  I have not evaluated her since February 2016.  She has a history of hypertensive heart disease with chronic combined systolic and diastolic heart failure.  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  Her most recent echocardiogram on 10/22/2009 showed normal LV systolic function, EF 41-32%, mild LVH, and grade I diastolic dysfunction.  She had been on carvedilol and lisinopril but is now on carvedilol and hydrochlorothiazide.  She is doing fairly well overall.  She does complain of fatigue.  She has morning headaches.  Her husband has sleep apnea and she is not certain how much she snores or if there has been apneic episodes.  She has chronic exertional dyspnea which is no different from one year ago.  She denies orthopnea, leg swelling, palpitations, and paroxysmal nocturnal dyspnea.  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   Soc Hx: Married. 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 Her brother is a Doctor, hospital in [place]). 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  Review of Systems: As per "subjective", otherwise negative.  Allergies  Allergen Reactions  . Coreg Cr [Carvedilol] Swelling  . Lisinopril Swelling    Current Outpatient Medications  Medication Sig Dispense Refill  . carvedilol (COREG) 6.25 MG tablet Take 1 tablet (6.25 mg total) by mouth 2 (two) times daily with a meal. 180 tablet 3  . cetirizine (ZYRTEC) 10 MG tablet Take 10 mg by mouth daily as needed.     . furosemide (LASIX) 20 MG tablet Take 1 tablet (20 mg total) by mouth daily. 90 tablet 3  . hydrochlorothiazide (MICROZIDE) 12.5 MG capsule Take 1 capsule (12.5 mg total) by mouth daily. 90 capsule 3  . Naproxen Sodium (ALEVE PO) Take 1 capsule by mouth as needed.     No current  facility-administered medications for this visit.     Past Medical History:  Diagnosis Date  . Cardiomyopathy    with  a history of congestive heart failure and an EF of 25% in 5/08; nl EF  on the echo in 2011  . Chronic combined systolic and diastolic CHF (congestive heart failure) ([place])   . Colon polyps   . Diverticulosis of colon   . Hypertension   . Hypertensive heart disease   . Osteopenia   . Tobacco abuse    50 pack year    Past Surgical History:  Procedure Laterality Date  . BREAST BIOPSY    . CARPAL TUNNEL RELEASE     right hand  . CERVICAL FUSION    . CESAREAN SECTION     2 times  . COLONOSCOPY  4401   pt uncertain as to whether polypectomy required or performed  . ECTOPIC PREGNANCY SURGERY    . TONSILLECTOMY      Social History   Socioeconomic History  . Marital status: Married    Spouse name: Not on file  . Number of children: Not on file  . Years of education: Not on file  . Highest education level: Not on file  Social Needs  . Financial resource strain: Not on file  . Food insecurity - worry: Not on file  . Food insecurity - inability: Not on file  . Transportation needs - medical: Not on file  . Transportation needs - non-medical: Not on file  Occupational History  . Occupation: Former Product manager: RETIRED  Tobacco Use  . Smoking status: Former Smoker    Packs/day: 1.00    Years: 50.00    Pack years: 50.00    Types: Cigarettes    Start date: 09/02/1960    Last attempt to quit: 03/29/2008    Years since quitting: 9.1  . Smokeless tobacco: Never Used  Substance and Sexual Activity  . Alcohol use: No    Alcohol/week: 0.0 oz  . Drug use: Not on file  . Sexual activity: Not Currently  Other Topics Concern  . Not on file  Social History Narrative   Married with 2 adult children     Vitals:   05/04/17 1308  BP: 120/86  Pulse: 86  SpO2: 97%  Weight: 172 lb 3.2 oz (78.1 kg)  Height: 5' 4.5" (1.638 m)    Wt Readings from Last 3  Encounters:  05/04/17 172 lb 3.2 oz (78.1 kg)  06/22/16 180 lb (81.6 kg)  07/04/15 175 lb (79.4 kg)     PHYSICAL EXAM General: NAD HEENT: Normal. Neck: No JVD, no thyromegaly. Lungs: Clear to auscultation bilaterally with normal respiratory effort. CV: Regular rate and rhythm, normal S1/S2, no S3/S4, no murmur. No pretibial or periankle edema.  No carotid bruit.   Abdomen: Soft, nontender, no distention.  Neurologic: Alert and oriented.  Psych: Normal affect. Skin: Normal. Musculoskeletal: No gross deformities.    ECG: Most recent ECG reviewed.   Labs: Lab Results  Component Value Date/Time   K 4.7 10/23/2008 09:07 PM   BUN 21 10/23/2008 09:07 PM   CREATININE 0.91 10/23/2008 09:07 PM   ALT 11 10/23/2008 09:07 PM   HGB 14.5 07/21/2006 11:23 AM     Lipids: No results found for: LDLCALC, LDLDIRECT, CHOL, TRIG, HDL     ASSESSMENT AND PLAN: 1.  Hypertensive heart disease with chronic combined systolic and diastolic heart failure: LVEF normalized in 2011 as detailed above.  Blood pressure is controlled.  I will obtain a repeat echocardiogram for surveillance purposes and given her shortness of breath and fatigue.  2.  Essential hypertension: Blood pressure is controlled.  No changes to therapy.  3.  Shortness of breath and fatigue: This may be due to cardiopulmonary deconditioning.  She is also a past smoker.  I will obtain an echocardiogram to assess for interval changes in cardiac function.  Given her history of hypertension and morning headaches along with fatigue, she may have sleep disordered breathing.  I will obtain a sleep study.  I will also check a vitamin D level.  4.  Exercise counseling was provided.  I talked to her about walking and attending group exercise classes.   Disposition: Follow up 1 year   Kate Sable, M.D., F.A.C.C.

## 2017-05-13 ENCOUNTER — Other Ambulatory Visit (HOSPITAL_COMMUNITY): Payer: Medicare Other

## 2017-05-17 ENCOUNTER — Telehealth: Payer: Self-pay

## 2017-05-17 ENCOUNTER — Ambulatory Visit (HOSPITAL_COMMUNITY)
Admission: RE | Admit: 2017-05-17 | Discharge: 2017-05-17 | Disposition: A | Discharge status: Home / Self Care | Payer: Medicare Other | Source: Ambulatory Visit | Attending: Cardiovascular Disease | Admitting: Cardiovascular Disease

## 2017-05-17 DIAGNOSIS — Z87891 Personal history of nicotine dependence: Secondary | ICD-10-CM | POA: Insufficient documentation

## 2017-05-17 DIAGNOSIS — R0602 Shortness of breath: Secondary | ICD-10-CM | POA: Diagnosis not present

## 2017-05-17 DIAGNOSIS — I083 Combined rheumatic disorders of mitral, aortic and tricuspid valves: Secondary | ICD-10-CM | POA: Diagnosis not present

## 2017-05-17 DIAGNOSIS — R5383 Other fatigue: Secondary | ICD-10-CM | POA: Insufficient documentation

## 2017-05-17 DIAGNOSIS — I429 Cardiomyopathy, unspecified: Secondary | ICD-10-CM | POA: Insufficient documentation

## 2017-05-17 DIAGNOSIS — Z79899 Other long term (current) drug therapy: Secondary | ICD-10-CM

## 2017-05-17 DIAGNOSIS — I119 Hypertensive heart disease without heart failure: Secondary | ICD-10-CM | POA: Diagnosis not present

## 2017-05-17 MED ORDER — LOSARTAN POTASSIUM 25 MG PO TABS: 25.0000 mg | ORAL_TABLET | Freq: Every day | ORAL | 3 refills | Status: DC

## 2017-05-17 NOTE — Progress Notes (Signed)
*  PRELIMINARY RESULTS* Echocardiogram 2D Echocardiogram has been performed.  Leavy Cella 05/17/2017, 10:38 AM

## 2017-05-17 NOTE — Telephone Encounter (Signed)
-----   Message from Herminio Commons, MD sent at 05/17/2017 11:53 AM EST ----- Heart function has declined. She will need to stop HCTZ and start losartan 25 mg daily. Check BMET 5 days after initiation. There is some valve leakage as well. This likely explains some of her symptoms of fatigue and shortness of breath.

## 2017-05-19 ENCOUNTER — Institutional Professional Consult (permissible substitution): Payer: Self-pay | Admitting: Neurology

## 2017-05-19 ENCOUNTER — Telehealth: Payer: Self-pay | Admitting: Cardiovascular Disease

## 2017-05-19 ENCOUNTER — Telehealth: Payer: Self-pay

## 2017-05-19 NOTE — Telephone Encounter (Signed)
would like to know results from Echo.

## 2017-05-19 NOTE — Telephone Encounter (Signed)
Pt did not show for their appt with Dr. Athar today.  

## 2017-05-20 NOTE — Telephone Encounter (Signed)
Attempt to reach, [id number] 

## 2017-05-23 NOTE — Telephone Encounter (Signed)
Pt notified, will stop HCTZ and start Losartan 25 mg daily. I will mail copy of echo report and lab slip

## 2017-05-30 LAB — BASIC METABOLIC PANEL
BUN/Creatinine Ratio: 13 (calc) (ref 6–22)
BUN: 15 mg/dL (ref 7–25)
CO2: 29 mmol/L (ref 20–32)
Calcium: 9.3 mg/dL (ref 8.6–10.4)
Chloride: 109 mmol/L (ref 98–110)
Creat: 1.18 mg/dL — ABNORMAL HIGH (ref 0.60–0.93)
GLUCOSE: 80 mg/dL (ref 65–139)
Potassium: 4.5 mmol/L (ref 3.5–5.3)
Sodium: 145 mmol/L (ref 135–146)

## 2017-05-31 ENCOUNTER — Telehealth: Payer: Self-pay

## 2017-05-31 DIAGNOSIS — Z79899 Other long term (current) drug therapy: Secondary | ICD-10-CM

## 2017-05-31 DIAGNOSIS — R0602 Shortness of breath: Secondary | ICD-10-CM

## 2017-05-31 NOTE — Telephone Encounter (Signed)
I informed patient and I gave her instructions for lexiscan.Brandy [name] will call her to schedule

## 2017-05-31 NOTE — Telephone Encounter (Signed)
-----   Message from Herminio Commons, MD sent at 05/31/2017 12:20 PM EST ----- Creatinine mildly elevated. Repeat BMET in 1 month.

## 2017-05-31 NOTE — Addendum Note (Signed)
Addended by: Barbarann Ehlers A on: 05/31/2017 03:18 PM   Modules accepted: Orders

## 2017-05-31 NOTE — Telephone Encounter (Signed)
I called patient to give her lab results. She mentioned for the last 2 months or so, she has increased SOB. it started as exertional, now is both at rest and with exertion.Denies leg swelling or weight gain. Denies productive  cough. She wants you to be aware

## 2017-05-31 NOTE — Telephone Encounter (Signed)
Let's get a Lexiscan.

## 2017-06-01 ENCOUNTER — Encounter: Payer: Self-pay | Admitting: *Deleted

## 2017-06-02 ENCOUNTER — Other Ambulatory Visit: Payer: Self-pay

## 2017-06-02 ENCOUNTER — Emergency Department (HOSPITAL_COMMUNITY): Payer: Medicare Other

## 2017-06-02 ENCOUNTER — Inpatient Hospital Stay (HOSPITAL_COMMUNITY)
Admission: EM | Admit: 2017-06-02 | Discharge: 2017-06-07 | DRG: 286 | Disposition: A | Discharge status: Home / Self Care | Payer: Medicare Other | Attending: Family Medicine | Admitting: Family Medicine

## 2017-06-02 ENCOUNTER — Encounter (HOSPITAL_COMMUNITY): Payer: Self-pay

## 2017-06-02 DIAGNOSIS — E785 Hyperlipidemia, unspecified: Secondary | ICD-10-CM | POA: Diagnosis present

## 2017-06-02 DIAGNOSIS — I10 Essential (primary) hypertension: Secondary | ICD-10-CM | POA: Diagnosis present

## 2017-06-02 DIAGNOSIS — I5023 Acute on chronic systolic (congestive) heart failure: Secondary | ICD-10-CM

## 2017-06-02 DIAGNOSIS — R748 Abnormal levels of other serum enzymes: Secondary | ICD-10-CM | POA: Diagnosis present

## 2017-06-02 DIAGNOSIS — M8580 Other specified disorders of bone density and structure, unspecified site: Secondary | ICD-10-CM | POA: Diagnosis present

## 2017-06-02 DIAGNOSIS — I2510 Atherosclerotic heart disease of native coronary artery without angina pectoris: Secondary | ICD-10-CM | POA: Diagnosis present

## 2017-06-02 DIAGNOSIS — F17210 Nicotine dependence, cigarettes, uncomplicated: Secondary | ICD-10-CM | POA: Diagnosis present

## 2017-06-02 DIAGNOSIS — X58XXXA Exposure to other specified factors, initial encounter: Secondary | ICD-10-CM | POA: Diagnosis present

## 2017-06-02 DIAGNOSIS — Z86010 Personal history of colonic polyps: Secondary | ICD-10-CM

## 2017-06-02 DIAGNOSIS — E876 Hypokalemia: Secondary | ICD-10-CM | POA: Diagnosis not present

## 2017-06-02 DIAGNOSIS — Z8249 Family history of ischemic heart disease and other diseases of the circulatory system: Secondary | ICD-10-CM

## 2017-06-02 DIAGNOSIS — J189 Pneumonia, unspecified organism: Secondary | ICD-10-CM | POA: Diagnosis not present

## 2017-06-02 DIAGNOSIS — I130 Hypertensive heart and chronic kidney disease with heart failure and stage 1 through stage 4 chronic kidney disease, or unspecified chronic kidney disease: Secondary | ICD-10-CM | POA: Diagnosis present

## 2017-06-02 DIAGNOSIS — R0602 Shortness of breath: Secondary | ICD-10-CM

## 2017-06-02 DIAGNOSIS — R0600 Dyspnea, unspecified: Secondary | ICD-10-CM

## 2017-06-02 DIAGNOSIS — N132 Hydronephrosis with renal and ureteral calculous obstruction: Secondary | ICD-10-CM | POA: Diagnosis present

## 2017-06-02 DIAGNOSIS — Z888 Allergy status to other drugs, medicaments and biological substances status: Secondary | ICD-10-CM

## 2017-06-02 DIAGNOSIS — R000 Tachycardia, unspecified: Secondary | ICD-10-CM | POA: Diagnosis not present

## 2017-06-02 DIAGNOSIS — D631 Anemia in chronic kidney disease: Secondary | ICD-10-CM | POA: Diagnosis present

## 2017-06-02 DIAGNOSIS — R7989 Other specified abnormal findings of blood chemistry: Secondary | ICD-10-CM

## 2017-06-02 DIAGNOSIS — I5043 Acute on chronic combined systolic (congestive) and diastolic (congestive) heart failure: Secondary | ICD-10-CM | POA: Diagnosis present

## 2017-06-02 DIAGNOSIS — R778 Other specified abnormalities of plasma proteins: Secondary | ICD-10-CM | POA: Insufficient documentation

## 2017-06-02 DIAGNOSIS — T783XXA Angioneurotic edema, initial encounter: Secondary | ICD-10-CM | POA: Diagnosis present

## 2017-06-02 DIAGNOSIS — N189 Chronic kidney disease, unspecified: Secondary | ICD-10-CM | POA: Diagnosis not present

## 2017-06-02 DIAGNOSIS — I5042 Chronic combined systolic (congestive) and diastolic (congestive) heart failure: Secondary | ICD-10-CM | POA: Insufficient documentation

## 2017-06-02 DIAGNOSIS — T502X5A Adverse effect of carbonic-anhydrase inhibitors, benzothiadiazides and other diuretics, initial encounter: Secondary | ICD-10-CM | POA: Diagnosis not present

## 2017-06-02 DIAGNOSIS — Z79899 Other long term (current) drug therapy: Secondary | ICD-10-CM | POA: Diagnosis not present

## 2017-06-02 DIAGNOSIS — J90 Pleural effusion, not elsewhere classified: Secondary | ICD-10-CM

## 2017-06-02 DIAGNOSIS — I340 Nonrheumatic mitral (valve) insufficiency: Secondary | ICD-10-CM | POA: Diagnosis not present

## 2017-06-02 DIAGNOSIS — N179 Acute kidney failure, unspecified: Secondary | ICD-10-CM | POA: Diagnosis not present

## 2017-06-02 DIAGNOSIS — N182 Chronic kidney disease, stage 2 (mild): Secondary | ICD-10-CM | POA: Diagnosis present

## 2017-06-02 DIAGNOSIS — I420 Dilated cardiomyopathy: Secondary | ICD-10-CM | POA: Diagnosis present

## 2017-06-02 DIAGNOSIS — I5021 Acute systolic (congestive) heart failure: Secondary | ICD-10-CM | POA: Diagnosis not present

## 2017-06-02 LAB — TROPONIN I
TROPONIN I: 0.17 ng/mL — AB (ref <0.03)
TROPONIN I: 0.17 ng/mL — AB (ref <0.03)

## 2017-06-02 LAB — BASIC METABOLIC PANEL
Anion gap: 10 (ref 5–15)
BUN: 14 mg/dL (ref 6–20)
CHLORIDE: 107 mmol/L (ref 101–111)
CO2: 25 mmol/L (ref 22–32)
Calcium: 9.2 mg/dL (ref 8.9–10.3)
Creatinine, Ser: 1.18 mg/dL — ABNORMAL HIGH (ref 0.44–1.00)
GFR calc Af Amer: 51 mL/min — ABNORMAL LOW (ref >60)
GFR calc non Af Amer: 44 mL/min — ABNORMAL LOW (ref >60)
Glucose, Bld: 89 mg/dL (ref 65–99)
POTASSIUM: 4.8 mmol/L (ref 3.5–5.1)
SODIUM: 142 mmol/L (ref 135–145)

## 2017-06-02 LAB — D-DIMER, QUANTITATIVE (NOT AT ARMC): D DIMER QUANT: 2.2 ug{FEU}/mL — AB (ref 0.00–0.50)

## 2017-06-02 LAB — I-STAT TROPONIN, ED: Troponin i, poc: 0.13 ng/mL (ref 0.00–0.08)

## 2017-06-02 LAB — CBC
HEMATOCRIT: 38.4 % (ref 36.0–46.0)
Hemoglobin: 12.2 g/dL (ref 12.0–15.0)
MCH: 29 pg (ref 26.0–34.0)
MCHC: 31.8 g/dL (ref 30.0–36.0)
MCV: 91.4 fL (ref 78.0–100.0)
Platelets: 201 10*3/uL (ref 150–400)
RBC: 4.2 MIL/uL (ref 3.87–5.11)
RDW: 13.3 % (ref 11.5–15.5)
WBC: 7.6 10*3/uL (ref 4.0–10.5)

## 2017-06-02 LAB — BRAIN NATRIURETIC PEPTIDE: B Natriuretic Peptide: 806 pg/mL — ABNORMAL HIGH (ref 0.0–100.0)

## 2017-06-02 LAB — INFLUENZA PANEL BY PCR (TYPE A & B)
INFLAPCR: NEGATIVE
Influenza B By PCR: NEGATIVE

## 2017-06-02 MED ORDER — SODIUM CHLORIDE 0.9% FLUSH
3.0000 mL | Freq: Two times a day (BID) | INTRAVENOUS | Status: DC
  Administered 2017-06-03 – 2017-06-06 (×5): 3 mL via INTRAVENOUS

## 2017-06-02 MED ORDER — LOSARTAN POTASSIUM 25 MG PO TABS
25.0000 mg | ORAL_TABLET | Freq: Every day | ORAL | Status: DC
  Administered 2017-06-02 – 2017-06-03 (×2): 25 mg via ORAL
  Filled 2017-06-02 (×2): qty 1

## 2017-06-02 MED ORDER — VANCOMYCIN HCL IN DEXTROSE 750-5 MG/150ML-% IV SOLN
750.0000 mg | Freq: Two times a day (BID) | INTRAVENOUS | Status: DC
  Filled 2017-06-02: qty 150

## 2017-06-02 MED ORDER — CARVEDILOL 6.25 MG PO TABS
6.2500 mg | ORAL_TABLET | Freq: Two times a day (BID) | ORAL | Status: DC
  Administered 2017-06-03 – 2017-06-06 (×6): 6.25 mg via ORAL
  Filled 2017-06-02 (×7): qty 1

## 2017-06-02 MED ORDER — LEVOFLOXACIN IN D5W 500 MG/100ML IV SOLN
500.0000 mg | INTRAVENOUS | Status: DC
  Administered 2017-06-02: 500 mg via INTRAVENOUS
  Filled 2017-06-02 (×2): qty 100

## 2017-06-02 MED ORDER — ASPIRIN 81 MG PO CHEW
324.0000 mg | CHEWABLE_TABLET | Freq: Once | ORAL | Status: AC
  Administered 2017-06-02: 324 mg via ORAL
  Filled 2017-06-02: qty 4

## 2017-06-02 MED ORDER — HEPARIN (PORCINE) IN NACL 100-0.45 UNIT/ML-% IJ SOLN
1200.0000 [IU]/h | INTRAMUSCULAR | Status: DC
  Administered 2017-06-02: 1050 [IU]/h via INTRAVENOUS
  Filled 2017-06-02 (×2): qty 250

## 2017-06-02 MED ORDER — LORATADINE 10 MG PO TABS
10.0000 mg | ORAL_TABLET | Freq: Every day | ORAL | Status: DC
  Administered 2017-06-02 – 2017-06-07 (×6): 10 mg via ORAL
  Filled 2017-06-02 (×6): qty 1

## 2017-06-02 MED ORDER — VANCOMYCIN HCL 10 G IV SOLR
1250.0000 mg | Freq: Once | INTRAVENOUS | Status: AC
  Administered 2017-06-03: 1250 mg via INTRAVENOUS
  Filled 2017-06-02: qty 1250

## 2017-06-02 MED ORDER — SODIUM CHLORIDE 0.9% FLUSH: 3.0000 mL | INTRAVENOUS | Status: DC | PRN

## 2017-06-02 MED ORDER — FUROSEMIDE 10 MG/ML IJ SOLN
20.0000 mg | Freq: Every day | INTRAMUSCULAR | Status: DC
  Administered 2017-06-02: 20 mg via INTRAVENOUS
  Filled 2017-06-02: qty 2

## 2017-06-02 MED ORDER — SODIUM CHLORIDE 0.9 % IV SOLN: 250.0000 mL | INTRAVENOUS | Status: DC | PRN

## 2017-06-02 MED ORDER — ATORVASTATIN CALCIUM 80 MG PO TABS
80.0000 mg | ORAL_TABLET | Freq: Every day | ORAL | Status: DC
  Administered 2017-06-03 – 2017-06-06 (×4): 80 mg via ORAL
  Filled 2017-06-02 (×4): qty 1

## 2017-06-02 MED ORDER — HEPARIN BOLUS VIA INFUSION
4000.0000 [IU] | Freq: Once | INTRAVENOUS | Status: AC
Start: 2017-06-02 — End: 2017-06-02
  Administered 2017-06-02: 4000 [IU] via INTRAVENOUS
  Filled 2017-06-02: qty 4000

## 2017-06-02 NOTE — Progress Notes (Addendum)
ANTICOAGULATION CONSULT NOTE - Initial Consult  Pharmacy Consult for heparin Indication: chest pain/ACS  Allergies  Allergen Reactions  . Lisinopril Swelling    Patient Measurements: Height: 5' 4.5" (163.8 cm) Weight: 170 lb (77.1 kg) IBW/kg (Calculated) : 55.85 Heparin Dosing Weight: 73kg  Vital Signs: Temp: 98.9 F (37.2 C) (03/07 1504) Temp Source: Oral (03/07 1504) BP: 158/107 (03/07 1504) Pulse Rate: 110 (03/07 1504)  Labs: Recent Labs    06/02/17 1513  HGB 12.2  HCT 38.4  PLT 201  CREATININE 1.18*    Estimated Creatinine Clearance: 42.5 mL/min (A) (by C-G formula based on SCr of 1.18 mg/dL (H)).   Assessment: 22 YOF here with 4 day history of worsening shortness of breath without exertion- no chest discomfort or cough. Noted in telephone notes with [facility] office that patient told staff on 3/5 she has had SOB for 2 months. Troponins elevated in the ED. She is not on anticoagulation PTA. CBC is WNL, no bleeding noted at baseline.  Goal of Therapy:  Heparin level 0.3-0.7 units/ml Monitor platelets by anticoagulation protocol: Yes   Plan:  Heparin bolus 4000 units IV x1, then start infusion at 1050 units/hr First level with AM labs Daily heparin level and CBC Follow cardiology plans and workup  Brandy [name] D. Baltazar Pekala, PharmD, [place] Clinical Pharmacist [phone] 06/02/2017 5:42 PM

## 2017-06-02 NOTE — ED Triage Notes (Addendum)
Pt presents with 4 day h/o shortness of breath.  Pt reports shortness without exertion, denies any chest discomfort, denies any cough.  Pt reports beginning losartan x 1 week ago.

## 2017-06-02 NOTE — Progress Notes (Signed)
CRITICAL VALUE ALERT  Critical Value:  Troponin- 0.17  Date & Time Notied:  06/02/16  2030  Provider Notified: Georges Mouse MD  Orders Received/Actions taken: No new orders.

## 2017-06-02 NOTE — Progress Notes (Signed)
Pharmacy Antibiotic Note  Brandy [name] is a 77 y.o. female admitted on 06/02/2017 with pneumonia.  Pharmacy has been consulted for Levaquin and Vancomycin  Dosing. Patient failed outpatient Azithromycin course.   Legionella and Strep pneumo UA pending.  Cultures pending. WBC wnl. Afebrile.  SCr 1.18 with estimated CrCl ~ 42 ml/min.    Plan:  Levaquin 500 mg IV every 24 hours.  If Legionella negative, consider alternative antibiotic class to help alleviate risk of side effects including but not limited to tendon rupture.  Vancomycin 1250 mg IV x1, then 750 mg IV every 12 hours.   Height: 5\' 4"  (162.6 cm) Weight: 172 lb 3.2 oz (78.1 kg) IBW/kg (Calculated) : 54.7  Temp (24hrs), Avg:98.6 F (37 C), Min:98.3 F (36.8 C), Max:98.9 F (37.2 C)  Recent Labs  Lab 05/30/17 1346 06/02/17 1513  WBC  --  7.6  CREATININE 1.18* 1.18*    Estimated Creatinine Clearance: 42.3 mL/min (A) (by C-G formula based on SCr of 1.18 mg/dL (H)).    Allergies  Allergen Reactions  . Lisinopril Swelling    Antimicrobials this admission: Vancomycin 3/7 >> Levaquin 3/7 >>  Dose adjustments this admission:   Microbiology results: 3/7 Blood cx >> 3/7 Sputum cx >>  Thank you for allowing pharmacy to be a part of this patient's care.  Sloan Leiter, PharmD, BCPS, BCCCP Clinical Pharmacist Clinical phone 06/02/2017 until [phone] After hours, please call #[phone] 06/02/2017 8:25 PM

## 2017-06-02 NOTE — ED Provider Notes (Addendum)
[facility] EMERGENCY DEPARTMENT Provider Note   CSN: [id number] Arrival date & time: 06/02/17  1501     History   Chief Complaint Chief Complaint  Patient presents with  . Shortness of Breath    HPI Brandy [name] is a 77 y.o. female.  HPI 77 year old female with history of nonischemic cardiomyopathy comes in with chief complaint of shortness of breath.  Patient also has history of hypertension, 50-pack-year smoking history.  Patient states that about 2 weeks ago she started having some cough and flulike symptoms.  Patient was given azithromycin by PCP.  Patient never fully recovered, however 4 days ago she started having shortness of breath with minimal exertion.  Patient is now having shortness of breath even at rest.  Additionally, she endorses orthopnea and paroxysmal nocturnal dyspnea.  Patient denies any associated chest pain.  At the moment patient is not having any productive cough, fevers, malaise. Pt has no hx of PE, DVT and denies any exogenous hormone (testosterone / estrogen) use, long distance travels or surgery in the past 6 weeks, active cancer, recent immobilization.    Past Medical History:  Diagnosis Date  . Cardiomyopathy    with  a history of congestive heart failure and an EF of 25% in 5/08; nl EF  on the echo in 2011  . Chronic combined systolic and diastolic CHF (congestive heart failure) ([place])   . Colon polyps   . Diverticulosis of colon   . Hypertension   . Hypertensive heart disease   . Osteopenia   . Tobacco abuse    50 pack year    Patient Active Problem List   Diagnosis Date Noted  . Other fatigue 05/04/2017  . Shortness of breath 05/04/2017  . Hypertensive heart disease   . Chronic combined systolic and diastolic CHF (congestive heart failure) ([place])   . Hypertension   . HTN (hypertension) 01/22/2013  . TOBACCO ABUSE 02/18/2008  . CARDIOMYOPATHY 02/18/2008  . DIVERTICULOSIS OF COLON 02/18/2008  . OSTEOPENIA  02/18/2008    Past Surgical History:  Procedure Laterality Date  . BREAST BIOPSY    . CARPAL TUNNEL RELEASE     right hand  . CERVICAL FUSION    . CESAREAN SECTION     2 times  . COLONOSCOPY  0347   pt uncertain as to whether polypectomy required or performed  . ECTOPIC PREGNANCY SURGERY    . TONSILLECTOMY      OB History    No data available       Home Medications    Prior to Admission medications   Medication Sig Start Date End Date Taking? Authorizing Provider  carvedilol (COREG) 6.25 MG tablet Take 1 tablet (6.25 mg total) by mouth 2 (two) times daily with a meal. 06/22/16  Yes Dunn, Dayna N, PA-C  cetirizine (ZYRTEC) 10 MG tablet Take 10 mg by mouth daily as needed for allergies.    Yes [provider]  losartan (COZAAR) 25 MG tablet Take 1 tablet (25 mg total) by mouth daily. 05/17/17 08/15/17 Yes Herminio Commons, MD  naproxen sodium (ALEVE) 220 MG tablet Take 440 mg by mouth daily as needed (pain.).   Yes [provider]  furosemide (LASIX) 20 MG tablet Take 1 tablet (20 mg total) by mouth daily. Patient not taking: Reported on 06/02/2017 06/22/16   Charlie Pitter, PA-C  Naproxen Sodium (ALEVE PO) Take 1 capsule by mouth as needed.    [provider]    Family History  Family History  Problem Relation Age of Onset  . Hypertension Mother     Social History Social History   Tobacco Use  . Smoking status: Former Smoker    Packs/day: 1.00    Years: 50.00    Pack years: 50.00    Types: Cigarettes    Start date: 09/02/1960    Last attempt to quit: 03/29/2008    Years since quitting: 9.1  . Smokeless tobacco: Never Used  Substance Use Topics  . Alcohol use: No    Alcohol/week: 0.0 oz  . Drug use: Not on file     Allergies   Lisinopril   Review of Systems Review of Systems  Constitutional: Positive for activity change.  Respiratory: Positive for cough and shortness of breath.   Cardiovascular: Positive for chest pain.    Gastrointestinal: Negative for nausea.  Skin: Negative for wound.  Allergic/Immunologic: Negative for immunocompromised state.  All other systems reviewed and are negative.    Physical Exam Updated Vital Signs BP (!) 158/107 (BP Location: Right Arm)   Pulse (!) 110   Temp 98.9 F (37.2 C) (Oral)   Resp 18   Ht 5' 4.5" (1.638 m)   Wt 77.1 kg (170 lb)   SpO2 98%   BMI 28.73 kg/m   Physical Exam  Constitutional: She is oriented to person, place, and time. She appears well-developed.  HENT:  Head: Normocephalic and atraumatic.  Eyes: EOM are normal.  Neck: Normal range of motion. Neck supple.  Cardiovascular: Normal rate.  Pulmonary/Chest: Effort normal. She has decreased breath sounds in the right lower field and the left lower field. She has no wheezes. She has no rhonchi. She has no rales.  Abdominal: Bowel sounds are normal.  Musculoskeletal:       Right lower leg: She exhibits no tenderness and no edema.       Left lower leg: She exhibits no tenderness and no edema.  Neurological: She is alert and oriented to person, place, and time.  Skin: Skin is warm and dry.  Nursing note and vitals reviewed.    ED Treatments / Results  Labs (all labs ordered are listed, but only abnormal results are displayed) Labs Reviewed  BASIC METABOLIC PANEL - Abnormal; Notable for the following components:      Result Value   Creatinine, Ser 1.18 (*)    GFR calc non Af Amer 44 (*)    GFR calc Af Amer 51 (*)    All other components within normal limits  BRAIN NATRIURETIC PEPTIDE - Abnormal; Notable for the following components:   B Natriuretic Peptide 806.0 (*)    All other components within normal limits  I-STAT TROPONIN, ED - Abnormal; Notable for the following components:   Troponin i, poc 0.13 (*)    All other components within normal limits  CBC  TROPONIN I  TROPONIN I  INFLUENZA PANEL BY PCR (TYPE A & B)  HEPARIN LEVEL (UNFRACTIONATED)  CBC    EKG  EKG  Interpretation  Date/Time:  Thursday June 02 2017 15:05:57 EST Ventricular Rate:  105 PR Interval:  178 QRS Duration: 90 QT Interval:  326 QTC Calculation: 430 R Axis:   -76 Text Interpretation:  Sinus tachycardia Left axis deviation Low voltage QRS Inferior infarct , age undetermined Cannot rule out Anteroseptal infarct , age undetermined Abnormal ECG No acute changes Confirmed by Varney Biles ([id number]) on 06/02/2017 5:08:22 PM       Radiology Dg Chest 2 View  Result Date: 06/02/2017  CLINICAL DATA:  Patient with shortness of breath. EXAM: CHEST - 2 VIEW COMPARISON:  Chest radiograph 08/15/2010. FINDINGS: Anterior cervical spinal fusion hardware. Stable cardiomegaly. Moderate left and small right pleural effusions with underlying consolidation. Thoracic spine degenerative changes. IMPRESSION: Moderate left and small right pleural effusions with underlying consolidation which may represent atelectasis or infection. Electronically Signed   By: Lovey Newcomer M.D.   On: 06/02/2017 16:30    Procedures Procedures (including critical care time)  CRITICAL CARE Performed by: Calani Gick   Total critical care time: 38 minutes  Critical care time was exclusive of separately billable procedures and treating other patients.  Critical care was necessary to treat or prevent imminent or life-threatening deterioration.  Critical care was time spent personally by me on the following activities: development of treatment plan with patient and/or surrogate as well as nursing, discussions with consultants, evaluation of patient's response to treatment, examination of patient, obtaining history from patient or surrogate, ordering and performing treatments and interventions, ordering and review of laboratory studies, ordering and review of radiographic studies, pulse oximetry and re-evaluation of patient's condition.   Medications Ordered in ED Medications  aspirin chewable tablet 324 mg (not  administered)  heparin bolus via infusion 4,000 Units (not administered)  heparin ADULT infusion 100 units/mL (25000 units/246mL sodium chloride 0.45%) (not administered)     Initial Impression / Assessment and Plan / ED Course  I have reviewed the triage vital signs and the nursing notes.  Pertinent labs & imaging results that were available during my care of the patient were reviewed by me and considered in my medical decision making (see chart for details).     77 year old comes in with chief complaint of shortness of breath.  Patient has known history of CHF.  Patient's past few days symptoms are consistent with CHF exacerbation over unstable angina.  On lung exam however patient is not having significant rales, thus in the setting of elevated troponin, we are keeping unstable angina in the differential diagnosis.  Patient was seen by cardiology in February.  2011 echocardiogram showed EF of 55-60%, however repeat echocardiogram thereafter showed EF of 40%.  No new medication changes were made since then.  EKG has no acute findings.  7:47 PM No call back from Cardiology. Will page again. Final Clinical Impressions(s) / ED Diagnoses   Final diagnoses:  Acute on chronic systolic congestive heart failure ([place])  Elevated troponin    ED Discharge Orders    None       Varney Biles, MD 06/02/17 [place], [place] Haislip, MD 06/02/17 1947

## 2017-06-02 NOTE — ED Notes (Signed)
Chelsea-RN@[facility]  notified of elevated I-Stat trop of .13

## 2017-06-02 NOTE — H&P (Addendum)
TRH H&P   Patient Demographics:    Brandy [name], is a 77 y.o. female  MRN: [id number]   DOB - 25-Sep-1940  Admit Date - 06/02/2017  Outpatient Primary MD for the patient is [name], MD  Referring MD/NP/PA:  Kathrynn Humble  Outpatient Specialists:  Dr. Bronson Ing (cardiology)  Patient coming from: home  Chief Complaint  Patient presents with  . Shortness of Breath      HPI:    Brandy [name]  is a 77 y.o. female, w CHF(EF 40%), Hypertension, Former smoker, apparently c/o dyspnea for the past 3 weeks. Nothing appears to make it worse. Initially had URI symptoms and tx with zpak due to dry cough.   Pt continues to have dyspnea, worse with exertion.  Pt denies fever, chills. Orthopnea, pnd, n/v, diarrhea, brbpr, black stool.    In Ed,  CXR IMPRESSION: Moderate left and small right pleural effusions with underlying consolidation which may represent atelectasis or infection.  Bun 14, Creatinin .1.18 Wbc 7.6, Hgb 12.2, Plt 201 BNP 806.0  Trop 0.13  EKG ST at 105, nl axis, Q in 2,3, avf, v1-3  Pt will be admitted for dyspnea secondary to CHF as well as ? Pneumonia, pleural effusion.      Review of systems:    In addition to the HPI above,  No Fever-chills, No Headache, No changes with Vision or hearing, No problems swallowing food or Liquids, No Chest pain,  No Abdominal pain, No Nausea or Vommitting, Bowel movements are regular, No Blood in stool or Urine, No dysuria, No new skin rashes or bruises, No new joints pains-aches,  No new weakness, tingling, numbness in any extremity, No recent weight gain or loss, No polyuria, polydypsia or polyphagia, No significant Mental Stressors.  A full 10 point Review of Systems was done, except as stated above, all other Review of Systems were negative.   With Past History of the following :    Past Medical History:    Diagnosis Date  . Cardiomyopathy    with  a history of congestive heart failure and an EF of 25% in 5/08; nl EF  on the echo in 2011  . Chronic combined systolic and diastolic CHF (congestive heart failure) ([place])   . Colon polyps   . Diverticulosis of colon   . Hypertension   . Hypertensive heart disease   . Osteopenia   . Tobacco abuse    50 pack year      Past Surgical History:  Procedure Laterality Date  . BREAST BIOPSY    . CARPAL TUNNEL RELEASE     right hand  . CERVICAL FUSION    . CESAREAN SECTION     2 times  . COLONOSCOPY  3154   pt uncertain as to whether polypectomy required or performed  . ECTOPIC PREGNANCY SURGERY    . TONSILLECTOMY  Social History:     Social History   Tobacco Use  . Smoking status: Former Smoker    Packs/day: 1.00    Years: 50.00    Pack years: 50.00    Types: Cigarettes    Start date: 09/02/1960    Last attempt to quit: 03/29/2008    Years since quitting: 9.1  . Smokeless tobacco: Never Used  Substance Use Topics  . Alcohol use: No    Alcohol/week: 0.0 oz     Lives - at home  Mobility - walks by self    Family History :     Family History  Problem Relation Age of Onset  . Hypertension Mother       Home Medications:   Prior to Admission medications   Medication Sig Start Date End Date Taking? Authorizing Provider  carvedilol (COREG) 6.25 MG tablet Take 1 tablet (6.25 mg total) by mouth 2 (two) times daily with a meal. 06/22/16  Yes Dunn, Dayna N, PA-C  cetirizine (ZYRTEC) 10 MG tablet Take 10 mg by mouth daily as needed for allergies.    Yes [provider]  losartan (COZAAR) 25 MG tablet Take 1 tablet (25 mg total) by mouth daily. 05/17/17 08/15/17 Yes Herminio Commons, MD  naproxen sodium (ALEVE) 220 MG tablet Take 440 mg by mouth daily as needed (pain.).   Yes [provider]  furosemide (LASIX) 20 MG tablet Take 1 tablet (20 mg total) by mouth daily. Patient not taking: Reported on  06/02/2017 06/22/16   Charlie Pitter, PA-C  Naproxen Sodium (ALEVE PO) Take 1 capsule by mouth as needed.    [provider]     Allergies:     Allergies  Allergen Reactions  . Lisinopril Swelling     Physical Exam:   Vitals  Blood pressure 137/89, pulse 73, temperature 98.9 F (37.2 C), temperature source Oral, resp. rate (!) 25, height 5' 4.5" (1.638 m), weight 77.1 kg (170 lb), SpO2 96 %.   1. General lying in bed in NAD,    2. Normal affect and insight, Not Suicidal or Homicidal, Awake Alert, Oriented X 3.  3. No F.N deficits, ALL C.Nerves Intact, Strength 5/5 all 4 extremities, Sensation intact all 4 extremities, Plantars down going.  4. Ears and Eyes appear Normal, Conjunctivae clear, PERRLA. Moist Oral Mucosa.  5. Supple Neck, + JVD, No cervical lymphadenopathy appriciated, No Carotid Bruits.  6. Symmetrical Chest wall movement, Good air movement bilaterally, slight decrease in bs at left lung base as well as crackles.   7.  rrr s1, s2, 2/6 sem apex  8. Positive Bowel Sounds, Abdomen Soft, No tenderness, No organomegaly appriciated,No rebound -guarding or rigidity.  9.  No Cyanosis, Normal Skin Turgor, No Skin Rash or Bruise.  10. Good muscle tone,  joints appear normal , no effusions, Normal ROM.  11. No Palpable Lymph Nodes in Neck or Axillae     Data Review:    CBC Recent Labs  Lab 06/02/17 1513  WBC 7.6  HGB 12.2  HCT 38.4  PLT 201  MCV 91.4  MCH 29.0  MCHC 31.8  RDW 13.3   ------------------------------------------------------------------------------------------------------------------  Chemistries  Recent Labs  Lab 05/30/17 1346 06/02/17 1513  NA 145 142  K 4.5 4.8  CL 109 107  CO2 29 25  GLUCOSE 80 89  BUN 15 14  CREATININE 1.18* 1.18*  CALCIUM 9.3 9.2   ------------------------------------------------------------------------------------------------------------------ estimated creatinine clearance is 42.5 mL/min (A) (by  C-G formula based  on SCr of 1.18 mg/dL (H)). ------------------------------------------------------------------------------------------------------------------ No results for input(s): TSH, T4TOTAL, T3FREE, THYROIDAB in the last 72 hours.  Invalid input(s): FREET3  Coagulation profile No results for input(s): INR, PROTIME in the last 168 hours. ------------------------------------------------------------------------------------------------------------------- No results for input(s): DDIMER in the last 72 hours. -------------------------------------------------------------------------------------------------------------------  Cardiac Enzymes No results for input(s): CKMB, TROPONINI, MYOGLOBIN in the last 168 hours.  Invalid input(s): CK ------------------------------------------------------------------------------------------------------------------    Component Value Date/Time   BNP 806.0 (H) 06/02/2017 1514     ---------------------------------------------------------------------------------------------------------------  Urinalysis No results found for: COLORURINE, APPEARANCEUR, LABSPEC, PHURINE, GLUCOSEU, HGBUR, BILIRUBINUR, KETONESUR, PROTEINUR, UROBILINOGEN, NITRITE, LEUKOCYTESUR  ----------------------------------------------------------------------------------------------------------------   Imaging Results:    Dg Chest 2 View  Result Date: 06/02/2017 CLINICAL DATA:  Patient with shortness of breath. EXAM: CHEST - 2 VIEW COMPARISON:  Chest radiograph 08/15/2010. FINDINGS: Anterior cervical spinal fusion hardware. Stable cardiomegaly. Moderate left and small right pleural effusions with underlying consolidation. Thoracic spine degenerative changes. IMPRESSION: Moderate left and small right pleural effusions with underlying consolidation which may represent atelectasis or infection. Electronically Signed   By: Lovey Newcomer M.D.   On: 06/02/2017 16:30       Assessment & Plan:     Principal Problem:   Dyspnea Active Problems:   HTN (hypertension)   Pneumonia   Pleural effusion   Dyspnea ? CHF, pneumonia, pleural effusion Tele Check d dimer if, positive then CTA chest r/o PE Blood culture x2 Urine strep, urine legionella antigen tx with Vanco iv, Levaquin 500mg  iv pharmacy to dose tx with Lasix 20mg  iv x1  Troponin elevation Trop I q6h x3 Check cardiac echo Cardiology consult  (email) Heparin iv started by ED, will continue for now Check lipid Start Lipitor  Acute CHF (EF 40%) Tele Trop I q6hx 3 Check cardiac echo Lasix 20mg  iv qday Cont Losartan 25mg  poqday Cont Carvedilol 6.25mg  po bid  Pleural effusion If CTA not performed please order lateral decubitus film for further evaluation   DVT Prophylaxis Heparin - - SCDs  AM Labs Ordered, also please review Full Orders  Family Communication: Admission, patients condition and plan of care including tests being ordered have been discussed with the patient  who indicate understanding and agree with the plan and Code Status.  Code Status FULL CODE  Likely DC to  home  Condition GUARDED   Consults called: cardiology by email  Admission status: inaptient  Time spent in minutes : 45   Jani Gravel M.D on 06/02/2017 at 6:58 PM  Between 7am to 7pm - Pager - [phone]   After 7pm go to [email or web address] - password [facility]  [facility] - Office  [phone]

## 2017-06-02 NOTE — ED Notes (Signed)
Patient is stable and ready to be transport to the floor at this time.  Report was called to 4E RN.  Belongings taken with the patient to the floor.   

## 2017-06-03 ENCOUNTER — Inpatient Hospital Stay (HOSPITAL_COMMUNITY): Payer: Medicare Other

## 2017-06-03 ENCOUNTER — Inpatient Hospital Stay (HOSPITAL_COMMUNITY)
Admission: EM | Disposition: A | Discharge status: Home / Self Care | Payer: Self-pay | Source: Home / Self Care | Attending: Internal Medicine

## 2017-06-03 DIAGNOSIS — J90 Pleural effusion, not elsewhere classified: Secondary | ICD-10-CM

## 2017-06-03 DIAGNOSIS — I5023 Acute on chronic systolic (congestive) heart failure: Secondary | ICD-10-CM

## 2017-06-03 DIAGNOSIS — I5021 Acute systolic (congestive) heart failure: Secondary | ICD-10-CM

## 2017-06-03 DIAGNOSIS — I10 Essential (primary) hypertension: Secondary | ICD-10-CM

## 2017-06-03 DIAGNOSIS — I340 Nonrheumatic mitral (valve) insufficiency: Secondary | ICD-10-CM

## 2017-06-03 DIAGNOSIS — R748 Abnormal levels of other serum enzymes: Secondary | ICD-10-CM

## 2017-06-03 HISTORY — PX: RIGHT/LEFT HEART CATH AND CORONARY ANGIOGRAPHY: CATH118266

## 2017-06-03 LAB — POCT I-STAT 3, ART BLOOD GAS (G3+)
ACID-BASE EXCESS: 1 mmol/L (ref 0.0–2.0)
BICARBONATE: 27.1 mmol/L (ref 20.0–28.0)
O2 SAT: 94 %
TCO2: 29 mmol/L (ref 22–32)
pCO2 arterial: 51.1 mmHg — ABNORMAL HIGH (ref 32.0–48.0)
pH, Arterial: 7.333 — ABNORMAL LOW (ref 7.350–7.450)
pO2, Arterial: 79 mmHg — ABNORMAL LOW (ref 83.0–108.0)

## 2017-06-03 LAB — LIPID PANEL
CHOL/HDL RATIO: 4.4 ratio
Cholesterol: 140 mg/dL (ref 0–200)
HDL: 32 mg/dL — AB (ref >40)
LDL CALC: 94 mg/dL (ref 0–99)
TRIGLYCERIDES: 71 mg/dL (ref <150)
VLDL: 14 mg/dL (ref 0–40)

## 2017-06-03 LAB — CBC
HCT: 36.9 % (ref 36.0–46.0)
HEMOGLOBIN: 11.8 g/dL — AB (ref 12.0–15.0)
MCH: 29.2 pg (ref 26.0–34.0)
MCHC: 32 g/dL (ref 30.0–36.0)
MCV: 91.3 fL (ref 78.0–100.0)
Platelets: 209 10*3/uL (ref 150–400)
RBC: 4.04 MIL/uL (ref 3.87–5.11)
RDW: 13.6 % (ref 11.5–15.5)
WBC: 6.1 10*3/uL (ref 4.0–10.5)

## 2017-06-03 LAB — POCT I-STAT 3, VENOUS BLOOD GAS (G3P V)
Bicarbonate: 27.6 mmol/L (ref 20.0–28.0)
O2 SAT: 65 %
TCO2: 29 mmol/L (ref 22–32)
pCO2, Ven: 56.6 mmHg (ref 44.0–60.0)
pH, Ven: 7.296 (ref 7.250–7.430)
pO2, Ven: 38 mmHg (ref 32.0–45.0)

## 2017-06-03 LAB — STREP PNEUMONIAE URINARY ANTIGEN: STREP PNEUMO URINARY ANTIGEN: NEGATIVE

## 2017-06-03 LAB — HIV ANTIBODY (ROUTINE TESTING W REFLEX): HIV SCREEN 4TH GENERATION: NONREACTIVE

## 2017-06-03 LAB — ECHOCARDIOGRAM COMPLETE
HEIGHTINCHES: 64 in
WEIGHTICAEL: 2740.8 [oz_av]

## 2017-06-03 LAB — PROTIME-INR
INR: 1.23
Prothrombin Time: 15.4 seconds — ABNORMAL HIGH (ref 11.4–15.2)

## 2017-06-03 LAB — HEPARIN LEVEL (UNFRACTIONATED): HEPARIN UNFRACTIONATED: 0.96 [IU]/mL — AB (ref 0.30–0.70)

## 2017-06-03 SURGERY — RIGHT/LEFT HEART CATH AND CORONARY ANGIOGRAPHY
Anesthesia: LOCAL

## 2017-06-03 MED ORDER — ONDANSETRON HCL 4 MG/2ML IJ SOLN: 4.0000 mg | Freq: Four times a day (QID) | INTRAMUSCULAR | Status: DC | PRN

## 2017-06-03 MED ORDER — VERAPAMIL HCL 2.5 MG/ML IV SOLN
INTRAVENOUS | Status: DC | PRN
  Administered 2017-06-03: 10 mL via INTRA_ARTERIAL

## 2017-06-03 MED ORDER — IOPAMIDOL (ISOVUE-370) INJECTION 76%
INTRAVENOUS | Status: AC
  Filled 2017-06-03: qty 100

## 2017-06-03 MED ORDER — SODIUM CHLORIDE 0.9 % IV SOLN
INTRAVENOUS | Status: DC
  Administered 2017-06-03: 900 mL via INTRAVENOUS

## 2017-06-03 MED ORDER — SODIUM CHLORIDE 0.9% FLUSH: 3.0000 mL | INTRAVENOUS | Status: DC | PRN

## 2017-06-03 MED ORDER — LIDOCAINE HCL (PF) 1 % IJ SOLN
INTRAMUSCULAR | Status: DC | PRN
  Administered 2017-06-03 (×2): 2 mL

## 2017-06-03 MED ORDER — IOPAMIDOL (ISOVUE-370) INJECTION 76%
INTRAVENOUS | Status: DC | PRN
  Administered 2017-06-03: 35 mL via INTRA_ARTERIAL

## 2017-06-03 MED ORDER — FENTANYL CITRATE (PF) 100 MCG/2ML IJ SOLN
INTRAMUSCULAR | Status: DC | PRN
  Administered 2017-06-03: 25 ug via INTRAVENOUS

## 2017-06-03 MED ORDER — IOPAMIDOL (ISOVUE-370) INJECTION 76%
INTRAVENOUS | Status: AC
  Administered 2017-06-03: 100 mL
  Filled 2017-06-03: qty 100

## 2017-06-03 MED ORDER — ASPIRIN 81 MG PO CHEW: 81.0000 mg | CHEWABLE_TABLET | ORAL | Status: DC

## 2017-06-03 MED ORDER — SODIUM CHLORIDE 0.9 % IV SOLN: 250.0000 mL | INTRAVENOUS | Status: DC | PRN

## 2017-06-03 MED ORDER — LIDOCAINE HCL (PF) 1 % IJ SOLN
INTRAMUSCULAR | Status: AC
  Filled 2017-06-03: qty 30

## 2017-06-03 MED ORDER — SODIUM CHLORIDE 0.9% FLUSH
3.0000 mL | Freq: Two times a day (BID) | INTRAVENOUS | Status: DC
  Administered 2017-06-03: 3 mL via INTRAVENOUS

## 2017-06-03 MED ORDER — HEPARIN (PORCINE) IN NACL 2-0.9 UNIT/ML-% IJ SOLN
INTRAMUSCULAR | Status: AC
  Filled 2017-06-03: qty 1000

## 2017-06-03 MED ORDER — MIDAZOLAM HCL 2 MG/2ML IJ SOLN
INTRAMUSCULAR | Status: DC | PRN
  Administered 2017-06-03: 0.5 mg via INTRAVENOUS
  Administered 2017-06-03: 1 mg via INTRAVENOUS
  Administered 2017-06-03: 0.5 mg via INTRAVENOUS

## 2017-06-03 MED ORDER — SODIUM CHLORIDE 0.9% FLUSH
3.0000 mL | Freq: Two times a day (BID) | INTRAVENOUS | Status: DC
  Administered 2017-06-04 – 2017-06-05 (×2): 3 mL via INTRAVENOUS

## 2017-06-03 MED ORDER — HEPARIN SODIUM (PORCINE) 1000 UNIT/ML IJ SOLN
INTRAMUSCULAR | Status: AC
  Filled 2017-06-03: qty 1

## 2017-06-03 MED ORDER — HYDRALAZINE HCL 25 MG PO TABS
25.0000 mg | ORAL_TABLET | Freq: Two times a day (BID) | ORAL | Status: DC
  Administered 2017-06-03 – 2017-06-04 (×2): 25 mg via ORAL
  Filled 2017-06-03 (×2): qty 1

## 2017-06-03 MED ORDER — ACETAMINOPHEN 325 MG PO TABS
650.0000 mg | ORAL_TABLET | ORAL | Status: DC | PRN
  Administered 2017-06-04 – 2017-06-05 (×2): 650 mg via ORAL
  Filled 2017-06-03 (×2): qty 2

## 2017-06-03 MED ORDER — FENTANYL CITRATE (PF) 100 MCG/2ML IJ SOLN
INTRAMUSCULAR | Status: AC
  Filled 2017-06-03: qty 2

## 2017-06-03 MED ORDER — FUROSEMIDE 10 MG/ML IJ SOLN
40.0000 mg | Freq: Two times a day (BID) | INTRAMUSCULAR | Status: DC
  Administered 2017-06-03 – 2017-06-05 (×4): 40 mg via INTRAVENOUS
  Filled 2017-06-03 (×4): qty 4

## 2017-06-03 MED ORDER — HEPARIN SODIUM (PORCINE) 1000 UNIT/ML IJ SOLN
INTRAMUSCULAR | Status: DC | PRN
  Administered 2017-06-03: 4000 [IU] via INTRAVENOUS

## 2017-06-03 MED ORDER — HEPARIN (PORCINE) IN NACL 2-0.9 UNIT/ML-% IJ SOLN
INTRAMUSCULAR | Status: AC | PRN
  Administered 2017-06-03 (×2): 500 mL

## 2017-06-03 MED ORDER — MIDAZOLAM HCL 2 MG/2ML IJ SOLN
INTRAMUSCULAR | Status: AC
  Filled 2017-06-03: qty 2

## 2017-06-03 MED ORDER — HEPARIN SODIUM (PORCINE) 5000 UNIT/ML IJ SOLN
5000.0000 [IU] | Freq: Three times a day (TID) | INTRAMUSCULAR | Status: DC
  Administered 2017-06-03 – 2017-06-07 (×11): 5000 [IU] via SUBCUTANEOUS
  Filled 2017-06-03 (×11): qty 1

## 2017-06-03 MED ORDER — VERAPAMIL HCL 2.5 MG/ML IV SOLN
INTRAVENOUS | Status: AC
  Filled 2017-06-03: qty 2

## 2017-06-03 SURGICAL SUPPLY — 13 items
CATH BALLN WEDGE 5F 110CM (CATHETERS) ×1 IMPLANT
CATH IMPULSE 5F ANG/FL3.5 (CATHETERS) ×1 IMPLANT
CATH INFINITI 5FR AL1 (CATHETERS) ×1 IMPLANT
DEVICE RAD COMP TR BAND LRG (VASCULAR PRODUCTS) ×1 IMPLANT
GLIDESHEATH SLEND SS 6F .021 (SHEATH) ×1 IMPLANT
GUIDEWIRE INQWIRE 1.5J.035X260 (WIRE) IMPLANT
INQWIRE 1.5J .035X260CM (WIRE) ×2
KIT HEART LEFT (KITS) ×2 IMPLANT
PACK CARDIAC CATHETERIZATION (CUSTOM PROCEDURE TRAY) ×2 IMPLANT
SHEATH GLIDE SLENDER 4/5FR (SHEATH) ×1 IMPLANT
TRANSDUCER W/STOPCOCK (MISCELLANEOUS) ×2 IMPLANT
TUBING CIL FLEX 10 FLL-RA (TUBING) ×2 IMPLANT
WIRE EMERALD 3MM-J .025X260CM (WIRE) ×1 IMPLANT

## 2017-06-03 NOTE — Consult Note (Addendum)
Cardiology Consultation:   Patient ID: Brandy [name]; [id number]; May 16, 1940   Admit date: 06/02/2017 Date of Consult: 06/03/2017  Primary Care Provider: [name], MD Primary Cardiologist: Dr. Bronson Ing   Patient Profile:   Brandy [name] is a 77 y.o. female with a hx of HTN, tobacco use, cardiomyopathy, chronic combined systolic and diastolic heart failure with an EF of 40% (initally 20-25% in 2008>recovered by 2011>>now back to 40% as of 05/2017),  hypertensive heart disease and diverticulosis who is being seen today for the evaluation of dyspnea on exertion, recent decrease in EF (40%) with elevated troponin at the request of Dr. Maudie Mercury.  History of Present Illness:   Brandy [name] is a 77 year old female, patient of Dr. Bronson Ing with a history as stated above, who last saw him in the office on 05/04/2017 (recently back on follow-up schedule) and was doing well overall, however had some complaints of mild exertional fatigue and morning headaches.  Per chart review, a remote echocardiogram in 2008 showed a decreased EF of 20-25% with interim echoes revealing improvement by 2011 with mild LVH and an EF of 55-60%, grade 1 DD.  Additionally, she has had a remote nuclear stress test in 2009 showing an EF of 37%, equivocal with possible scar in the distribution of mid distal LAD or breast attenuation artifact, and no ischemia. Given her office complaints of exertional fatigue and morning headaches, Dr. Bronson Ing scheduled for a repeat echocardiogram and OP sleep study for surveillance purposes.   Her follow-up echocardiogram was completed on 05/17/2017 which unfortunately showed a decrease in her LVEF to 40% with moderate LVH, severe hypokinesis of the inferoseptal myocardium, and abnormal global longitudinal strain. Dr. Bronson Ing made aware of her echocardiogram findings and subsequently scheduled her for an outpatient Lexiscan Myoview on 06/08/2017.  On 06/02/2017 patient presented to the emergency  department with continued complaints of dyspnea on exertion which having been ongoing, but seemed to worsen over the last 5 days. She reports being unable to tolerate mild activities within her home, such as walking short distances from bedroom to kitchen without being out of breath. She denies anginal symptoms, vomiting, back or chest pain, dizziness or syncope recent illness, orthopnea, or PND, however reports mild nausea which began as her dyspnea became worse.   In the ED, a chest x-ray was completed which showed moderate left and small right pleural effusions with underlying consolidation which may represent atelectasis or infection. WBC stable at 7.6.  Her BNP was 806 and a troponin level was drawn which was elevated at 0.13.  An EKG was performed which showed sinus tachycardia, poor R-wave progression and questionable Q waves in leads II, III, aVF, V1-V3. An echocardiogram was ordered with pending results. Heparin drip was started. A D-dimer was elevated at 2.20 and per EDP notes, she will undergo a chest CTA to rule out PE. IV antibiotics were started given questionable pneumonia on chest x-ray.   Cardiology was consulted for further recommendations and follow up.   Past Medical History:  Diagnosis Date  . Cardiomyopathy    with  a history of congestive heart failure and an EF of 25% in 5/08; nl EF  on the echo in 2011  . Chronic combined systolic and diastolic CHF (congestive heart failure) ([place])   . Colon polyps   . Diverticulosis of colon   . Hypertension   . Hypertensive heart disease   . Osteopenia   . Tobacco abuse    50 pack year    Past  Surgical History:  Procedure Laterality Date  . BREAST BIOPSY    . CARPAL TUNNEL RELEASE     right hand  . CERVICAL FUSION    . CESAREAN SECTION     2 times  . COLONOSCOPY  1610   pt uncertain as to whether polypectomy required or performed  . ECTOPIC PREGNANCY SURGERY    . TONSILLECTOMY       Prior to Admission medications     Medication Sig Start Date End Date Taking? Authorizing Provider  carvedilol (COREG) 6.25 MG tablet Take 1 tablet (6.25 mg total) by mouth 2 (two) times daily with a meal. 06/22/16  Yes Dunn, Dayna N, PA-C  cetirizine (ZYRTEC) 10 MG tablet Take 10 mg by mouth daily as needed for allergies.    Yes [provider]  losartan (COZAAR) 25 MG tablet Take 1 tablet (25 mg total) by mouth daily. 05/17/17 08/15/17 Yes Herminio Commons, MD  naproxen sodium (ALEVE) 220 MG tablet Take 440 mg by mouth daily as needed (pain.).   Yes [provider]  furosemide (LASIX) 20 MG tablet Take 1 tablet (20 mg total) by mouth daily. Patient not taking: Reported on 06/02/2017 06/22/16   Charlie Pitter, PA-C  Naproxen Sodium (ALEVE PO) Take 1 capsule by mouth as needed.    [provider]    Inpatient Medications: Scheduled Meds: . iopamidol      . atorvastatin  80 mg Oral q1800  . carvedilol  6.25 mg Oral BID WC  . furosemide  20 mg Intravenous Daily  . loratadine  10 mg Oral Daily  . losartan  25 mg Oral Daily  . sodium chloride flush  3 mL Intravenous Q12H   Continuous Infusions: . sodium chloride    . heparin 1,050 Units/hr (06/02/17 1909)  . levofloxacin (LEVAQUIN) IV Stopped (06/03/17 0224)  . vancomycin     PRN Meds: sodium chloride, sodium chloride flush  Allergies:    Allergies  Allergen Reactions  . Lisinopril Swelling    Social History:   Social History   Socioeconomic History  . Marital status: Married    Spouse name: Not on file  . Number of children: Not on file  . Years of education: Not on file  . Highest education level: Not on file  Social Needs  . Financial resource strain: Not on file  . Food insecurity - worry: Not on file  . Food insecurity - inability: Not on file  . Transportation needs - medical: Not on file  . Transportation needs - non-medical: Not on file  Occupational History  . Occupation: Former Product manager: RETIRED  Tobacco  Use  . Smoking status: Former Smoker    Packs/day: 1.00    Years: 50.00    Pack years: 50.00    Types: Cigarettes    Start date: 09/02/1960    Last attempt to quit: 03/29/2008    Years since quitting: 9.1  . Smokeless tobacco: Never Used  Substance and Sexual Activity  . Alcohol use: No    Alcohol/week: 0.0 oz  . Drug use: Not on file  . Sexual activity: Not Currently  Other Topics Concern  . Not on file  Social History Narrative   Married with 2 adult children    Family History:   Family History  Problem Relation Age of Onset  . Hypertension Mother    Family Status:  Family Status  Relation Name Status  . Mother  Deceased  . Father  Deceased    ROS:  Please see the history of present illness.  All other ROS reviewed and negative.     Physical Exam/Data:   Vitals:   06/02/17 2018 06/02/17 2355 06/03/17 0417 06/03/17 0830  BP: (!) 156/96 (!) 145/86 (!) 154/93 (!) 154/88  Pulse: (!) 106 83 96 87  Resp: (!) 23 (!) 25 (!) 21 (!) 22  Temp: 98.3 F (36.8 C) 98 F (36.7 C) 98.1 F (36.7 C) 97.7 F (36.5 C)  TempSrc: Oral Oral Oral Oral  SpO2: 96% 95% 97% 98%  Weight: 172 lb 3.2 oz (78.1 kg)  171 lb 4.8 oz (77.7 kg)   Height: 5\' 4"  (1.626 m)       Intake/Output Summary (Last 24 hours) at 06/03/2017 1026 Last data filed at 06/03/2017 0800 Gross per 24 hour  Intake 120 ml  Output 900 ml  Net -780 ml   Filed Weights   06/02/17 1509 06/02/17 2018 06/03/17 0417  Weight: 170 lb (77.1 kg) 172 lb 3.2 oz (78.1 kg) 171 lb 4.8 oz (77.7 kg)   Body mass index is 29.4 kg/m.   General: Well developed, well nourished, NAD Skin: Warm, dry, intact  Head: Normocephalic, atraumatic, clear, moist mucus membranes. Neck: Negative for carotid bruits. No JVD Lungs:Clear to ausculation bilaterally. No wheezes, rales, or rhonchi. Breathing is unlabored. Cardiovascular: RRR with S1 S2. No murmurs, rubs, or gallops Abdomen: Soft, non-tender, non-distended with normoactive bowel sounds.  No obvious abdominal masses. MSK: Strength and tone appear normal for age. 5/5 in all extremities Extremities: No LE edema, mild 1+ UE edema. No clubbing or cyanosis. DP/PT pulses 2+ bilaterally Neuro: Alert and oriented. No focal deficits. No facial asymmetry. MAE spontaneously. Psych: Responds to questions appropriately with normal affect.     EKG:  The EKG was personally reviewed and demonstrates: 06/02/17 NSR/ST; unable to fully assess given baseline wonder. New EKG order placed  Telemetry:  Telemetry was personally reviewed and demonstrates: NSR/ST HR 91  Relevant CV Studies:  ECHO: 05/07/17 Study Conclusions  - Left ventricle: The cavity size was normal. Wall thickness was   increased in a pattern of moderate LVH. The estimated ejection   fraction was 40%. Diffuse hypokinesis. There is severe   hypokinesis of the inferoseptal myocardium. Abnormal global   longitudinal strain of -12.4%. The study is not technically   sufficient to allow evaluation of LV diastolic function. - Aortic valve: Mildly calcified annulus. Trileaflet. - Mitral valve: Mildly thickened leaflets . There was mild to   moderate regurgitation. - Left atrium: The atrium was moderately to severely dilated. - Right atrium: Central venous pressure (est): 3 mm Hg. - Atrial septum: No defect or patent foramen ovale was identified. - Tricuspid valve: There was mild regurgitation. - Pulmonary arteries: PA peak pressure: 28 mm Hg (S). - Pericardium, extracardiac: There was no pericardial effusion.  CATH: NA  Laboratory Data:  Chemistry Recent Labs  Lab 05/30/17 1346 06/02/17 1513  NA 145 142  K 4.5 4.8  CL 109 107  CO2 29 25  GLUCOSE 80 89  BUN 15 14  CREATININE 1.18* 1.18*  CALCIUM 9.3 9.2  GFRNONAA  --  44*  GFRAA  --  51*  ANIONGAP  --  10    Total Protein  Date Value Ref Range Status  10/23/2008 6.8 6.0 - 8.3 g/dL Final   Albumin  Date Value Ref Range Status  10/23/2008 4.1 3.5 - 5.2 g/dL  Final   AST  Date Value  Ref Range Status  10/23/2008 17 0 - 37 units/L Final   ALT  Date Value Ref Range Status  10/23/2008 11 0 - 35 units/L Final   Alkaline Phosphatase  Date Value Ref Range Status  10/23/2008 76 39 - 117 units/L Final   Total Bilirubin  Date Value Ref Range Status  10/23/2008 0.2 (L) 0.3 - 1.2 mg/dL Final   Hematology Recent Labs  Lab 06/02/17 1513 06/03/17 0419  WBC 7.6 6.1  RBC 4.20 4.04  HGB 12.2 11.8*  HCT 38.4 36.9  MCV 91.4 91.3  MCH 29.0 29.2  MCHC 31.8 32.0  RDW 13.3 13.6  PLT 201 209   Cardiac Enzymes Recent Labs  Lab 06/02/17 1719 06/02/17 2110  TROPONINI 0.17* 0.17*    Recent Labs  Lab 06/02/17 1531  TROPIPOC 0.13*    BNP Recent Labs  Lab 06/02/17 1514  BNP 806.0*    DDimer  Recent Labs  Lab 06/02/17 2110  DDIMER 2.20*   TSH: No results found for: TSH Lipids: Lab Results  Component Value Date   CHOL 140 06/03/2017   HDL 32 (L) 06/03/2017   LDLCALC 94 06/03/2017   TRIG 71 06/03/2017   CHOLHDL 4.4 06/03/2017   HgbA1c:No results found for: HGBA1C  Radiology/Studies:  Dg Chest 2 View  Result Date: 06/02/2017 CLINICAL DATA:  Patient with shortness of breath. EXAM: CHEST - 2 VIEW COMPARISON:  Chest radiograph 08/15/2010. FINDINGS: Anterior cervical spinal fusion hardware. Stable cardiomegaly. Moderate left and small right pleural effusions with underlying consolidation. Thoracic spine degenerative changes. IMPRESSION: Moderate left and small right pleural effusions with underlying consolidation which may represent atelectasis or infection. Electronically Signed   By: Lovey Newcomer M.D.   On: 06/02/2017 16:30    Assessment and Plan:   1.Acute on chronic combined systolic and diastolic heart failure: -Last echocardiogram on 05/07/2017 with decrease in LVEF to 40% with moderate LVH, severe hypokinesis of the inferoseptal myocardium, and abnormal global longitudinal strain>>>pending echo today 06/03/17 -Elevated BNP on  arrival, 806 -D-dimer, 2.20>>>>Hep gtt started -Increase IV Lasix to 40 mg BID  -Will stop losartan and start hydralazine 25mg  PO BID. Once cath is complete, will add IMDUR as pressures allow.  2. Elevated troponin: -iPOC trop, 0.13>> with troponin I elevated at 0.17, 0.17 will continue to trend -Hep gtt started -Continue carvedilol 6.25mg  PO BID, Lipitor  -Cardiac cath today 06/03/17 for further ischemic evaluation  -ASA pre-cath   3. Pleural effusion: -Chest x-ray on arrival to the emergency department revealed moderate left and small right pleural effusions with underlying consolidation which may represent atelectasis or infection. -IV antibiotics started per EDP given questionable PNA  4. Former Tobacco Use: -Quit 07/10/2011 -50 pack year  5. Acute kidney insufficiency: -Cr, 1.18>1.18 -Continue to avoid nephrotoxic medications -Soft pre-cath hydration given decreased EF  6. HLD: -Lipid panel, CHO-140, HDL-32, LDL-94, Trig-71 -Continue lipitor    For questions or updates, please contact [facility] Please consult [email or web address] for contact info under Cardiology/STEMI.   Lyndel Safe NP-C [facility] Pager: [phone] 06/03/2017 10:26 AM   Personally seen and examined. Agree with above.  77 year old female with dilated cardiomyopathy, reduced ejection fraction currently 40% range here with acute systolic heart failure.  She is breathing better after diuresis.  Her son, Chriss Czar, is a Scientific laboratory technician in [place].  Exam: Minimal JVD noted, respiratory rate normal, lungs no significant crackles at bases, heart rate regular, no murmurs, trace edema bilateral lower extremities, alert, 2+ radial pulse  Labs:  Personally reviewed as above.  Allergies: Had angioedema to lisinopril.  Assessment and plan  Acute systolic heart failure/dilated cardiomyopathy -We will go ahead and get a cardiac catheterization in order to confirm whether her cardiomyopathy is  ischemic or nonischemic in etiology.  Spoke with her son on the phone, cardiothoracic surgeon, both she and he agree with plan.  Risks and benefits including stroke heart attack death renal impairment bleeding discussed, willing to proceed. -She is able to lay flat.  Should be able to tolerate procedure.  Continuing with IV Lasix. -We will avoid use of angiotensin receptor blocker, losartan as well as Entresto or ACE inhibitor because of her prior angioedema with ACE inhibitor.  There is a small percentage cross-reactivity with angiotensin receptor blocker but risk still remains too high.  We could utilize hydralazine for afterload reduction if necessary. -Pleural effusion likely heart failure related.  Continue with diuresis.  We will continue to follow.  Candee Furbish, MD

## 2017-06-03 NOTE — Progress Notes (Signed)
[place] for heparin Indication: chest pain/ACS  Allergies  Allergen Reactions  . Lisinopril Swelling    Patient Measurements: Height: 5\' 4"  (162.6 cm) Weight: 171 lb 4.8 oz (77.7 kg) IBW/kg (Calculated) : 54.7 Heparin Dosing Weight: 73kg  Vital Signs: Temp: 98.1 F (36.7 C) (03/08 0417) Temp Source: Oral (03/08 0417) BP: 154/93 (03/08 0417) Pulse Rate: 96 (03/08 0417)  Labs: Recent Labs    06/02/17 1513 06/02/17 1719 06/02/17 2110 06/03/17 0419  HGB 12.2  --   --  11.8*  HCT 38.4  --   --  36.9  PLT 201  --   --  209  HEPARINUNFRC  --   --   --  <0.10*  CREATININE 1.18*  --   --   --   TROPONINI  --  0.17* 0.17*  --     Assessment: 31 YOF here with 4 day history of worsening shortness of breath without exertion- no chest discomfort or cough. Noted in telephone notes with [place] office that patient told staff on 3/5 she has had SOB for 2 months.  Heparin level is undetectable - no issues with infusion overnight.  Goal of Therapy:  Heparin level 0.3-0.7 units/ml Monitor platelets by anticoagulation protocol: Yes   Plan:  Increase heparin to 1350 units/hr Level in 8 hours Daily heparin level and CBC  Harvel Quale 06/03/2017 6:23 AM

## 2017-06-03 NOTE — Progress Notes (Signed)
  Echocardiogram 2D Echocardiogram has been performed.  Selen Smucker T Cortlandt Capuano 06/03/2017, 11:42 AM

## 2017-06-03 NOTE — Progress Notes (Signed)
Brandy [name] for heparin Indication: chest pain/ACS  Allergies  Allergen Reactions  . Lisinopril Swelling    Patient Measurements: Height: 5\' 4"  (162.6 cm) Weight: 171 lb 4.8 oz (77.7 kg) IBW/kg (Calculated) : 54.7 Heparin Dosing Weight: 73kg  Vital Signs: Temp: 97.8 F (36.6 C) (03/08 1158) Temp Source: Oral (03/08 1158) BP: 157/94 (03/08 1158) Pulse Rate: 71 (03/08 1158)  Labs: Recent Labs    06/02/17 1513 06/02/17 1719 06/02/17 2110 06/03/17 0419 06/03/17 1421  HGB 12.2  --   --  11.8*  --   HCT 38.4  --   --  36.9  --   PLT 201  --   --  209  --   LABPROT  --   --   --   --  15.4*  INR  --   --   --   --  1.23  HEPARINUNFRC  --   --   --  <0.10* 0.96*  CREATININE 1.18*  --   --   --   --   TROPONINI  --  0.17* 0.17*  --   --     Assessment: 26 YOF here with 4 day history of worsening shortness of breath without exertion- no chest discomfort or cough. Noted in telephone notes with [place] office that patient told staff on 3/5 she has had SOB for 2 months.  Heparin level now 0.96  Goal of Therapy:  Heparin level 0.3-0.7 units/ml Monitor platelets by anticoagulation protocol: Yes   Plan:  Heparin to 1200 units / hr Follow up after cath  Thank you Anette Guarneri, PharmD [phone] 06/03/2017 3:23 PM

## 2017-06-03 NOTE — Progress Notes (Addendum)
Addendum  CTA chest results reviewed.  No pulmonary embolus.  No pneumonia.  Mild pulmonary edema.  Discontinued levofloxacin and vancomycin. Defer IV Heparin decision to Cardiology.  Vernell Leep, MD, FACP, [facility]. [facility] Pager [phone]  If 7PM-7AM, please contact night-coverage [email or web address] Password [facility] 06/03/2017, 5:03 PM

## 2017-06-03 NOTE — Progress Notes (Signed)
PROGRESS NOTE   Brandy [name]  JKK:[id number]    DOB: 04-20-1940    DOA: 06/02/2017  PCP: [name], MD   I have briefly reviewed patients previous medical records in [facility].  Brief Narrative:  77 year old married female, independent of daily activities, not on home oxygen, PMH of cardiomyopathy, chronic combined CHF, HTN, hypertensive heart disease, tobacco abuse, presented with approximately 3 weeks history of progressive dyspnea on minimal exertion and at rest, no chest pain, no leg edema, recently treated for URTI symptoms with a course of Z-Pak with only temporary relief.  Has been relatively sedentary since onset of symptoms.  TTE during recent OP cardiology follow-up showed EF 40% and was supposed to have stress test 06/08/17.  Dyspnea likely multifactorial: Acute bronchitis, decompensated CHF, pleural effusions, rule out PE, pneumonia seems less likely, rule out angina equivalent/CAD.  Cardiology consulted and plan cardiac cath.  CTA chest pending.   Assessment & Plan:   Principal Problem:   Dyspnea Active Problems:   HTN (hypertension)   Pneumonia   Pleural effusion   Dyspnea: Possibly multifactorial: Acute viral bronchitis, decompensated CHF, pleural effusions, rule out PE/pneumonia, angina equivalent/CAD.  Empirically started on IV vancomycin and levofloxacin, discontinue if CT chest negative for pneumonia.  Risks and benefits of getting CTA chest with contrast were discussed in detail with patient and spouse including and not limited to allergic reaction/angioedema and nephrotoxicity.  They verbalized understanding and wished to proceed.  On IV Lasix, increased by Cardiology.  On IV heparin for elevated troponin and rule out PE.  Cardiology planning cardiac cath.  Acute on chronic combined systolic and diastolic CHF/dilated cardiomyopathy: 2D echo 05/07/17: LVEF 40%, moderate LVH, severe hypokinesis of the inferoseptal myocardium.  BNP on arrival 806.  Clinically does not  appear significantly volume overloaded.  Cardiology input appreciated, have increased Lasix IV to 40 mg twice daily, stop losartan, started hydralazine 25 mg twice daily and plan cardiac catheterization to confirm whether her cardiomyopathy is ischemic or nonischemic in etiology.  Cardiology have discussed with patient's son who is a Scientific laboratory technician in [place].  Repeat TTE 06/03/17: Pending  Elevated troponin: Peaked to 0.17.  No chest pain reported.  Heparin drip started on admission.  Continue carvedilol 6.25 mg twice daily and Lipitor.  Could be demand ischemia versus ACS.  Cardiology plans cardiac cath.  Essential hypertension: Mildly uncontrolled.  Continue carvedilol 6.25 mg twice daily, hydralazine 25 mg twice daily.  May need further adjustment.  Tobacco abuse: Patient claims to have quit smoking 5 years ago but has been using e-cigarettes.  Possible stage II chronic kidney disease: Last known creatinine prior to this admission was 0.91 on 10/23/08.  Presented with creatinine of 1.18 which is stable.  Hyperlipidemia: LDL 94.  Continue atorvastatin.  Normocytic anemia: Follow CBCs while on heparin drip.  History of angioedema to lisinopril: Avoid use of ARB, ACEI or Entresto.   DVT prophylaxis: Currently on IV heparin drip. Code Status: Full Family Communication: Discussed in detail with patient's spouse at bedside.  Updated care and answered questions. Disposition: DC home pending further workup and clinical improvement.   Consultants:  Cardiology  Procedures:  None  Antimicrobials:  Vancomycin and levofloxacin   Subjective: Seen early this morning.  No chest pain reported.  Denies fever or chills.  Minimal intermittent dry cough.  Dyspnea improved but ongoing dyspnea with minimal exertion.  States that she has been sedentary without much mobility over the last 3 weeks.  Denies leg  edema or weight gain.  No recent long distance travel.  ROS: As  above.  Objective:  Vitals:   06/02/17 2355 06/03/17 0417 06/03/17 0830 06/03/17 1158  BP: (!) 145/86 (!) 154/93 (!) 154/88 (!) 157/94  Pulse: 83 96 87 71  Resp: (!) 25 (!) 21 (!) 22 (!) 22  Temp: 98 F (36.7 C) 98.1 F (36.7 C) 97.7 F (36.5 C) 97.8 F (36.6 C)  TempSrc: Oral Oral Oral Oral  SpO2: 95% 97% 98% 100%  Weight:  77.7 kg (171 lb 4.8 oz)    Height:        Examination:  General exam: Pleasant elderly female, moderately built and nourished, lying comfortably propped up in bed. Respiratory system: Diminished breath sounds in the bases but otherwise clear to auscultation.  Occasional bibasilar crackles. Respiratory effort normal. Cardiovascular system: S1 & S2 heard, RRR. No JVD, murmurs, rubs, gallops or clicks. No pedal edema.  Telemetry personally reviewed: Sinus rhythm. Gastrointestinal system: Abdomen is nondistended, soft and nontender. No organomegaly or masses felt. Normal bowel sounds heard. Central nervous system: Alert and oriented. No focal neurological deficits. Extremities: Symmetric 5 x 5 power. Skin: No rashes, lesions or ulcers Psychiatry: Judgement and insight appear normal. Mood & affect appropriate.     Data Reviewed: I have personally reviewed following labs and imaging studies  CBC: Recent Labs  Lab 06/02/17 1513 06/03/17 0419  WBC 7.6 6.1  HGB 12.2 11.8*  HCT 38.4 36.9  MCV 91.4 91.3  PLT 201 263   Basic Metabolic Panel: Recent Labs  Lab 05/30/17 1346 06/02/17 1513  NA 145 142  K 4.5 4.8  CL 109 107  CO2 29 25  GLUCOSE 80 89  BUN 15 14  CREATININE 1.18* 1.18*  CALCIUM 9.3 9.2   Cardiac Enzymes: Recent Labs  Lab 06/02/17 1719 06/02/17 2110  TROPONINI 0.17* 0.17*    No results found for this or any previous visit (from the past 240 hour(s)).       Radiology Studies: Dg Chest 2 View  Result Date: 06/02/2017 CLINICAL DATA:  Patient with shortness of breath. EXAM: CHEST - 2 VIEW COMPARISON:  Chest radiograph  08/15/2010. FINDINGS: Anterior cervical spinal fusion hardware. Stable cardiomegaly. Moderate left and small right pleural effusions with underlying consolidation. Thoracic spine degenerative changes. IMPRESSION: Moderate left and small right pleural effusions with underlying consolidation which may represent atelectasis or infection. Electronically Signed   By: Lovey Newcomer M.D.   On: 06/02/2017 16:30        Scheduled Meds: . aspirin  81 mg Oral Pre-Cath  . atorvastatin  80 mg Oral q1800  . carvedilol  6.25 mg Oral BID WC  . furosemide  40 mg Intravenous BID  . hydrALAZINE  25 mg Oral BID  . iopamidol      . loratadine  10 mg Oral Daily  . sodium chloride flush  3 mL Intravenous Q12H  . sodium chloride flush  3 mL Intravenous Q12H   Continuous Infusions: . sodium chloride    . sodium chloride    . sodium chloride    . heparin 1,350 Units/hr (06/03/17 1051)  . levofloxacin (LEVAQUIN) IV Stopped (06/03/17 0224)  . vancomycin       LOS: 1 day     Vernell Leep, MD, FACP, [facility]. [facility] Pager [phone]  If 7PM-7AM, please contact night-coverage [email or web address] Password [email or web address] 06/03/2017, 1:47 PM

## 2017-06-03 NOTE — H&P (View-Only) (Signed)
Cardiology Consultation:   Patient ID: Brandy [name]; [id number]; [birth year]   Admit date: 06/02/2017 Date of Consult: 06/03/2017  Primary Care Provider: [name], MD Primary Cardiologist: Dr. Bronson Ing   Patient Profile:   Brandy [name] is a 77 y.o. female with a hx of HTN, tobacco use, cardiomyopathy, chronic combined systolic and diastolic heart failure with an EF of 40% (initally 20-25% in 2008>recovered by 2011>>now back to 40% as of 05/2017),  hypertensive heart disease and diverticulosis who is being seen today for the evaluation of dyspnea on exertion, recent decrease in EF (40%) with elevated troponin at the request of Dr. Maudie Mercury.  History of Present Illness:   [name] is a 77 year old female, patient of Dr. Bronson Ing with a history as stated above, who last saw him in the office on 05/04/2017 (recently back on follow-up schedule) and was doing well overall, however had some complaints of mild exertional fatigue and morning headaches.  Per chart review, a remote echocardiogram in 2008 showed a decreased EF of 20-25% with interim echoes revealing improvement by 2011 with mild LVH and an EF of 55-60%, grade 1 DD.  Additionally, she has had a remote nuclear stress test in 2009 showing an EF of 37%, equivocal with possible scar in the distribution of mid distal LAD or breast attenuation artifact, and no ischemia. Given her office complaints of exertional fatigue and morning headaches, Dr. Bronson Ing scheduled for a repeat echocardiogram and OP sleep study for surveillance purposes.   Her follow-up echocardiogram was completed on 05/17/2017 which unfortunately showed a decrease in her LVEF to 40% with moderate LVH, severe hypokinesis of the inferoseptal myocardium, and abnormal global longitudinal strain. Dr. Bronson Ing made aware of her echocardiogram findings and subsequently scheduled her for an outpatient Lexiscan Myoview on 06/08/2017.  On 06/02/2017 patient presented to the emergency  department with continued complaints of dyspnea on exertion which having been ongoing, but seemed to worsen over the last 5 days. She reports being unable to tolerate mild activities within her home, such as walking short distances from bedroom to kitchen without being out of breath. She denies anginal symptoms, vomiting, back or chest pain, dizziness or syncope recent illness, orthopnea, or PND, however reports mild nausea which began as her dyspnea became worse.   In the ED, a chest x-ray was completed which showed moderate left and small right pleural effusions with underlying consolidation which may represent atelectasis or infection. WBC stable at 7.6.  Her BNP was 806 and a troponin level was drawn which was elevated at 0.13.  An EKG was performed which showed sinus tachycardia, poor R-wave progression and questionable Q waves in leads II, III, aVF, V1-V3. An echocardiogram was ordered with pending results. Heparin drip was started. A D-dimer was elevated at 2.20 and per EDP notes, she will undergo a chest CTA to rule out PE. IV antibiotics were started given questionable pneumonia on chest x-ray.   Cardiology was consulted for further recommendations and follow up.   Past Medical History:  Diagnosis Date  . Cardiomyopathy    with  a history of congestive heart failure and an EF of 25% in 5/08; nl EF  on the echo in 2011  . Chronic combined systolic and diastolic CHF (congestive heart failure) ([place])   . Colon polyps   . Diverticulosis of colon   . Hypertension   . Hypertensive heart disease   . Osteopenia   . Tobacco abuse    50 pack year    Past  Surgical History:  Procedure Laterality Date  . BREAST BIOPSY    . CARPAL TUNNEL RELEASE     right hand  . CERVICAL FUSION    . CESAREAN SECTION     2 times  . COLONOSCOPY  8921   pt uncertain as to whether polypectomy required or performed  . ECTOPIC PREGNANCY SURGERY    . TONSILLECTOMY       Prior to Admission medications     Medication Sig Start Date End Date Taking? Authorizing Provider  carvedilol (COREG) 6.25 MG tablet Take 1 tablet (6.25 mg total) by mouth 2 (two) times daily with a meal. 06/22/16  Yes Dunn, Dayna N, PA-C  cetirizine (ZYRTEC) 10 MG tablet Take 10 mg by mouth daily as needed for allergies.    Yes [provider]  losartan (COZAAR) 25 MG tablet Take 1 tablet (25 mg total) by mouth daily. 05/17/17 08/15/17 Yes Herminio Commons, MD  naproxen sodium (ALEVE) 220 MG tablet Take 440 mg by mouth daily as needed (pain.).   Yes [provider]  furosemide (LASIX) 20 MG tablet Take 1 tablet (20 mg total) by mouth daily. Patient not taking: Reported on 06/02/2017 06/22/16   Charlie Pitter, PA-C  Naproxen Sodium (ALEVE PO) Take 1 capsule by mouth as needed.    [provider]    Inpatient Medications: Scheduled Meds: . iopamidol      . atorvastatin  80 mg Oral q1800  . carvedilol  6.25 mg Oral BID WC  . furosemide  20 mg Intravenous Daily  . loratadine  10 mg Oral Daily  . losartan  25 mg Oral Daily  . sodium chloride flush  3 mL Intravenous Q12H   Continuous Infusions: . sodium chloride    . heparin 1,050 Units/hr (06/02/17 1909)  . levofloxacin (LEVAQUIN) IV Stopped (06/03/17 0224)  . vancomycin     PRN Meds: sodium chloride, sodium chloride flush  Allergies:    Allergies  Allergen Reactions  . Lisinopril Swelling    Social History:   Social History   Socioeconomic History  . Marital status: Married    Spouse name: Not on file  . Number of children: Not on file  . Years of education: Not on file  . Highest education level: Not on file  Social Needs  . Financial resource strain: Not on file  . Food insecurity - worry: Not on file  . Food insecurity - inability: Not on file  . Transportation needs - medical: Not on file  . Transportation needs - non-medical: Not on file  Occupational History  . Occupation: Former Product manager: RETIRED  Tobacco  Use  . Smoking status: Former Smoker    Packs/day: 1.00    Years: 50.00    Pack years: 50.00    Types: Cigarettes    Start date: 09/02/1960    Last attempt to quit: 03/29/2008    Years since quitting: 9.1  . Smokeless tobacco: Never Used  Substance and Sexual Activity  . Alcohol use: No    Alcohol/week: 0.0 oz  . Drug use: Not on file  . Sexual activity: Not Currently  Other Topics Concern  . Not on file  Social History Narrative   Married with 2 adult children    Family History:   Family History  Problem Relation Age of Onset  . Hypertension Mother    Family Status:  Family Status  Relation Name Status  . Mother  Deceased  . Father  Deceased    ROS:  Please see the history of present illness.  All other ROS reviewed and negative.     Physical Exam/Data:   Vitals:   06/02/17 2018 06/02/17 2355 06/03/17 0417 06/03/17 0830  BP: (!) 156/96 (!) 145/86 (!) 154/93 (!) 154/88  Pulse: (!) 106 83 96 87  Resp: (!) 23 (!) 25 (!) 21 (!) 22  Temp: 98.3 F (36.8 C) 98 F (36.7 C) 98.1 F (36.7 C) 97.7 F (36.5 C)  TempSrc: Oral Oral Oral Oral  SpO2: 96% 95% 97% 98%  Weight: 172 lb 3.2 oz (78.1 kg)  171 lb 4.8 oz (77.7 kg)   Height: 5\' 4"  (1.626 m)       Intake/Output Summary (Last 24 hours) at 06/03/2017 1026 Last data filed at 06/03/2017 0800 Gross per 24 hour  Intake 120 ml  Output 900 ml  Net -780 ml   Filed Weights   06/02/17 1509 06/02/17 2018 06/03/17 0417  Weight: 170 lb (77.1 kg) 172 lb 3.2 oz (78.1 kg) 171 lb 4.8 oz (77.7 kg)   Body mass index is 29.4 kg/m.   General: Well developed, well nourished, NAD Skin: Warm, dry, intact  Head: Normocephalic, atraumatic, clear, moist mucus membranes. Neck: Negative for carotid bruits. No JVD Lungs:Clear to ausculation bilaterally. No wheezes, rales, or rhonchi. Breathing is unlabored. Cardiovascular: RRR with S1 S2. No murmurs, rubs, or gallops Abdomen: Soft, non-tender, non-distended with normoactive bowel sounds.  No obvious abdominal masses. MSK: Strength and tone appear normal for age. 5/5 in all extremities Extremities: No LE edema, mild 1+ UE edema. No clubbing or cyanosis. DP/PT pulses 2+ bilaterally Neuro: Alert and oriented. No focal deficits. No facial asymmetry. MAE spontaneously. Psych: Responds to questions appropriately with normal affect.     EKG:  The EKG was personally reviewed and demonstrates: 06/02/17 NSR/ST; unable to fully assess given baseline wonder. New EKG order placed  Telemetry:  Telemetry was personally reviewed and demonstrates: NSR/ST HR 91  Relevant CV Studies:  ECHO: 05/07/17 Study Conclusions  - Left ventricle: The cavity size was normal. Wall thickness was   increased in a pattern of moderate LVH. The estimated ejection   fraction was 40%. Diffuse hypokinesis. There is severe   hypokinesis of the inferoseptal myocardium. Abnormal global   longitudinal strain of -12.4%. The study is not technically   sufficient to allow evaluation of LV diastolic function. - Aortic valve: Mildly calcified annulus. Trileaflet. - Mitral valve: Mildly thickened leaflets . There was mild to   moderate regurgitation. - Left atrium: The atrium was moderately to severely dilated. - Right atrium: Central venous pressure (est): 3 mm Hg. - Atrial septum: No defect or patent foramen ovale was identified. - Tricuspid valve: There was mild regurgitation. - Pulmonary arteries: PA peak pressure: 28 mm Hg (S). - Pericardium, extracardiac: There was no pericardial effusion.  CATH: NA  Laboratory Data:  Chemistry Recent Labs  Lab 05/30/17 1346 06/02/17 1513  NA 145 142  K 4.5 4.8  CL 109 107  CO2 29 25  GLUCOSE 80 89  BUN 15 14  CREATININE 1.18* 1.18*  CALCIUM 9.3 9.2  GFRNONAA  --  44*  GFRAA  --  51*  ANIONGAP  --  10    Total Protein  Date Value Ref Range Status  10/23/2008 6.8 6.0 - 8.3 g/dL Final   Albumin  Date Value Ref Range Status  10/23/2008 4.1 3.5 - 5.2 g/dL  Final   AST  Date Value  Ref Range Status  10/23/2008 17 0 - 37 units/L Final   ALT  Date Value Ref Range Status  10/23/2008 11 0 - 35 units/L Final   Alkaline Phosphatase  Date Value Ref Range Status  10/23/2008 76 39 - 117 units/L Final   Total Bilirubin  Date Value Ref Range Status  10/23/2008 0.2 (L) 0.3 - 1.2 mg/dL Final   Hematology Recent Labs  Lab 06/02/17 1513 06/03/17 0419  WBC 7.6 6.1  RBC 4.20 4.04  HGB 12.2 11.8*  HCT 38.4 36.9  MCV 91.4 91.3  MCH 29.0 29.2  MCHC 31.8 32.0  RDW 13.3 13.6  PLT 201 209   Cardiac Enzymes Recent Labs  Lab 06/02/17 1719 06/02/17 2110  TROPONINI 0.17* 0.17*    Recent Labs  Lab 06/02/17 1531  TROPIPOC 0.13*    BNP Recent Labs  Lab 06/02/17 1514  BNP 806.0*    DDimer  Recent Labs  Lab 06/02/17 2110  DDIMER 2.20*   TSH: No results found for: TSH Lipids: Lab Results  Component Value Date   CHOL 140 06/03/2017   HDL 32 (L) 06/03/2017   LDLCALC 94 06/03/2017   TRIG 71 06/03/2017   CHOLHDL 4.4 06/03/2017   HgbA1c:No results found for: HGBA1C  Radiology/Studies:  Dg Chest 2 View  Result Date: 06/02/2017 CLINICAL DATA:  Patient with shortness of breath. EXAM: CHEST - 2 VIEW COMPARISON:  Chest radiograph 08/15/2010. FINDINGS: Anterior cervical spinal fusion hardware. Stable cardiomegaly. Moderate left and small right pleural effusions with underlying consolidation. Thoracic spine degenerative changes. IMPRESSION: Moderate left and small right pleural effusions with underlying consolidation which may represent atelectasis or infection. Electronically Signed   By: Lovey Newcomer M.D.   On: 06/02/2017 16:30    Assessment and Plan:   1.Acute on chronic combined systolic and diastolic heart failure: -Last echocardiogram on 05/07/2017 with decrease in LVEF to 40% with moderate LVH, severe hypokinesis of the inferoseptal myocardium, and abnormal global longitudinal strain>>>pending echo today 06/03/17 -Elevated BNP on  arrival, 806 -D-dimer, 2.20>>>>Hep gtt started -Increase IV Lasix to 40 mg BID  -Will stop losartan and start hydralazine 25mg  PO BID. Once cath is complete, will add IMDUR as pressures allow.  2. Elevated troponin: -iPOC trop, 0.13>> with troponin I elevated at 0.17, 0.17 will continue to trend -Hep gtt started -Continue carvedilol 6.25mg  PO BID, Lipitor  -Cardiac cath today 06/03/17 for further ischemic evaluation  -ASA pre-cath   3. Pleural effusion: -Chest x-ray on arrival to the emergency department revealed moderate left and small right pleural effusions with underlying consolidation which may represent atelectasis or infection. -IV antibiotics started per EDP given questionable PNA  4. Former Tobacco Use: -Quit 07/10/2011 -50 pack year  5. Acute kidney insufficiency: -Cr, 1.18>1.18 -Continue to avoid nephrotoxic medications -Soft pre-cath hydration given decreased EF  6. HLD: -Lipid panel, CHO-140, HDL-32, LDL-94, Trig-71 -Continue lipitor    For questions or updates, please contact [facility] Please consult [email or web address] for contact info under Cardiology/STEMI.   Lyndel Safe NP-C [facility] Pager: [phone] 06/03/2017 10:26 AM   Personally seen and examined. Agree with above.  77 year old female with dilated cardiomyopathy, reduced ejection fraction currently 40% range here with acute systolic heart failure.  She is breathing better after diuresis.  Her son, Chriss Czar, is a Scientific laboratory technician in [place].  Exam: Minimal JVD noted, respiratory rate normal, lungs no significant crackles at bases, heart rate regular, no murmurs, trace edema bilateral lower extremities, alert, 2+ radial pulse  Labs:  Personally reviewed as above.  Allergies: Had angioedema to lisinopril.  Assessment and plan  Acute systolic heart failure/dilated cardiomyopathy -We will go ahead and get a cardiac catheterization in order to confirm whether her cardiomyopathy is  ischemic or nonischemic in etiology.  Spoke with her son on the phone, cardiothoracic surgeon, both she and he agree with plan.  Risks and benefits including stroke heart attack death renal impairment bleeding discussed, willing to proceed. -She is able to lay flat.  Should be able to tolerate procedure.  Continuing with IV Lasix. -We will avoid use of angiotensin receptor blocker, losartan as well as Entresto or ACE inhibitor because of her prior angioedema with ACE inhibitor.  There is a small percentage cross-reactivity with angiotensin receptor blocker but risk still remains too high.  We could utilize hydralazine for afterload reduction if necessary. -Pleural effusion likely heart failure related.  Continue with diuresis.  We will continue to follow.  Candee Furbish, MD

## 2017-06-03 NOTE — Interval H&P Note (Signed)
Cath Lab Visit (complete for each Cath Lab visit)  Clinical Evaluation Leading to the Procedure:   ACS: Yes.    Non-ACS:    Anginal Classification: CCS IV  Anti-ischemic medical therapy: Minimal Therapy (1 class of medications)  Non-Invasive Test Results: No non-invasive testing performed  Prior CABG: No previous CABG   Decreased EF.   History and Physical Interval Note:  06/03/2017 4:41 PM  Brandy [name]  has presented today for surgery, with the diagnosis of hf  The various methods of treatment have been discussed with the patient and family. After consideration of risks, benefits and other options for treatment, the patient has consented to  Procedure(s): LEFT HEART CATH AND CORONARY ANGIOGRAPHY (N/A) as a surgical intervention .  The patient's history has been reviewed, patient examined, no change in status, stable for surgery.  I have reviewed the patient's chart and labs.  Questions were answered to the patient's satisfaction.     Larae Grooms

## 2017-06-04 DIAGNOSIS — E876 Hypokalemia: Secondary | ICD-10-CM

## 2017-06-04 DIAGNOSIS — I5043 Acute on chronic combined systolic (congestive) and diastolic (congestive) heart failure: Secondary | ICD-10-CM

## 2017-06-04 LAB — BASIC METABOLIC PANEL
ANION GAP: 9 (ref 5–15)
BUN: 14 mg/dL (ref 6–20)
CHLORIDE: 102 mmol/L (ref 101–111)
CO2: 29 mmol/L (ref 22–32)
Calcium: 8.5 mg/dL — ABNORMAL LOW (ref 8.9–10.3)
Creatinine, Ser: 1.1 mg/dL — ABNORMAL HIGH (ref 0.44–1.00)
GFR, EST AFRICAN AMERICAN: 56 mL/min — AB (ref >60)
GFR, EST NON AFRICAN AMERICAN: 48 mL/min — AB (ref >60)
Glucose, Bld: 108 mg/dL — ABNORMAL HIGH (ref 65–99)
POTASSIUM: 3.4 mmol/L — AB (ref 3.5–5.1)
SODIUM: 140 mmol/L (ref 135–145)

## 2017-06-04 LAB — LEGIONELLA PNEUMOPHILA SEROGP 1 UR AG: L. PNEUMOPHILA SEROGP 1 UR AG: NEGATIVE

## 2017-06-04 LAB — CBC
HCT: 35.1 % — ABNORMAL LOW (ref 36.0–46.0)
HEMOGLOBIN: 11.2 g/dL — AB (ref 12.0–15.0)
MCH: 28.9 pg (ref 26.0–34.0)
MCHC: 31.9 g/dL (ref 30.0–36.0)
MCV: 90.7 fL (ref 78.0–100.0)
Platelets: 219 10*3/uL (ref 150–400)
RBC: 3.87 MIL/uL (ref 3.87–5.11)
RDW: 13.4 % (ref 11.5–15.5)
WBC: 6.1 10*3/uL (ref 4.0–10.5)

## 2017-06-04 MED ORDER — ISOSORB DINITRATE-HYDRALAZINE 20-37.5 MG PO TABS
1.0000 | ORAL_TABLET | Freq: Three times a day (TID) | ORAL | Status: DC
  Administered 2017-06-04 – 2017-06-07 (×8): 1 via ORAL
  Filled 2017-06-04 (×9): qty 1

## 2017-06-04 MED ORDER — SALINE SPRAY 0.65 % NA SOLN
1.0000 | NASAL | Status: DC | PRN
  Filled 2017-06-04: qty 44

## 2017-06-04 MED ORDER — POTASSIUM CHLORIDE CRYS ER 20 MEQ PO TBCR
40.0000 meq | EXTENDED_RELEASE_TABLET | Freq: Once | ORAL | Status: AC
  Administered 2017-06-04: 40 meq via ORAL
  Filled 2017-06-04: qty 2

## 2017-06-04 MED ORDER — FUROSEMIDE 10 MG/ML IJ SOLN
40.0000 mg | Freq: Once | INTRAMUSCULAR | Status: AC
  Administered 2017-06-04: 40 mg via INTRAVENOUS
  Filled 2017-06-04: qty 4

## 2017-06-04 MED ORDER — IPRATROPIUM-ALBUTEROL 0.5-2.5 (3) MG/3ML IN SOLN: 3.0000 mL | Freq: Four times a day (QID) | RESPIRATORY_TRACT | Status: DC | PRN

## 2017-06-04 MED ORDER — SACUBITRIL-VALSARTAN 24-26 MG PO TABS: 1.0000 | ORAL_TABLET | Freq: Two times a day (BID) | ORAL | Status: DC

## 2017-06-04 NOTE — Progress Notes (Signed)
PROGRESS NOTE   Brandy [name]  VQM:[id number]    DOB: 08-07-1940    DOA: 06/02/2017  PCP: [name], MD   I have briefly reviewed patients previous medical records in [facility].  Brief Narrative:  77 year old married female, independent of daily activities, not on home oxygen, PMH of cardiomyopathy, chronic combined CHF, HTN, hypertensive heart disease, tobacco abuse, presented with approximately 3 weeks history of progressive dyspnea on minimal exertion and at rest, no chest pain, no leg edema, recently treated for URTI symptoms with a course of Z-Pak with only temporary relief.  Has been relatively sedentary since onset of symptoms.  TTE during recent OP cardiology follow-up showed EF 40% and was supposed to have stress test 06/08/17.  Dyspnea likely multifactorial: Acute bronchitis, decompensated CHF, pleural effusions, rule out PE, pneumonia seems less likely, rule out angina equivalent/CAD.  Cardiology consulted and s/p cardiac cath 3/8.  Treating for decompensated CHF.  Improving.   Assessment & Plan:   Principal Problem:   Dyspnea Active Problems:   HTN (hypertension)   Pneumonia   Pleural effusion   Dyspnea: After extensive workup, felt to be most likely due to decompensated CHF.  CTA chest negative for PE or pneumonia.  Empirically started antibiotics (vancomycin and Levaquin) were discontinued.  Cardiac cath showed mild nonobstructive CAD.  Improving.    Acute on chronic combined systolic and diastolic CHF/dilated cardiomyopathy: 2D echo 05/07/17: LVEF 40%, moderate LVH, severe hypokinesis of the inferoseptal myocardium.  BNP on arrival 806.  Cardiology input appreciated, have increased Lasix IV to 40 mg twice daily. Repeat TTE 06/03/17: LVEF 35-40%, diffuse hypokinesis and grade 3 diastolic dysfunction.  -2.6 L thus far.  As per cardiology, elevated filling pressures by cath, continue carvedilol 6.25 mg twice daily, hydralazine changed to BiDil, no ACEI/ARB or Entresto due to  angioedema with lisinopril and continue IV diuresis today.  Has not had significant peripheral edema.  Elevated troponin: Peaked to 0.17.  No chest pain reported.  Heparin drip started on admission.  Continue carvedilol 6.25 mg twice daily and Lipitor.  Could be demand ischemia versus ACS.  Cardiac cath 3/8 showed mild nonobstructive CAD.  Essential hypertension: Controlled.  Continue carvedilol 6.25 mg twice daily, hydralazine changed to BiDil.  Monitor.  Tobacco abuse: Patient claims to have quit smoking 5 years ago but has been using e-cigarettes.  Possible stage II chronic kidney disease: Last known creatinine prior to this admission was 0.91 on 10/23/08.  Presented with creatinine of 1.18 which has improved to 1.1.  Follow closely while being diuresed IV.  Hypokalemia: Secondary to diuresis.  Replace and follow.  Hyperlipidemia: LDL 94.  Continue atorvastatin.  Normocytic anemia: Stable.  Heparin drip discontinued post cath.  History of angioedema to lisinopril: Avoid use of ARB, ACEI or Entresto.   DVT prophylaxis: Subcutaneous heparin. Code Status: Full Family Communication: Discussed in detail with patient's spouse at bedside.  I also called patient's brother who is a Scientific laboratory technician in [place].  Updated care and answered questions. Disposition: DC home pending clinical improvement, possibly 3/10.   Consultants:  Cardiology  Procedures:  None  Antimicrobials:  Vancomycin and levofloxacin   Subjective: Feels much better.  States that her dyspnea is 85% improved/breathing not yet at baseline & she has not moved around much.  No chest pain.  ROS: As above.  Objective:  Vitals:   06/04/17 0010 06/04/17 0459 06/04/17 0819 06/04/17 1159  BP: 128/80 133/82 131/77 120/76  Pulse: 96 82 96 84  Resp: 20 15 (!) 21   Temp: 97.7 F (36.5 C) 97.9 F (36.6 C) (!) 97.4 F (36.3 C)   TempSrc: Oral Oral Oral   SpO2: 100% 99%    Weight:  76.7 kg (169 lb 1.6 oz)      Height:        Examination:  General exam: Pleasant elderly female, moderately built and nourished, sitting up comfortably in bed. Respiratory system: Occasional basal crackles, left > right but otherwise clear to auscultation. Respiratory effort normal. Cardiovascular system: S1 & S2 heard, RRR. No JVD, murmurs, rubs, gallops or clicks. No pedal edema.  Telemetry personally reviewed: Sinus rhythm.  Stable without change. Gastrointestinal system: Abdomen is nondistended, soft and nontender. No organomegaly or masses felt. Normal bowel sounds heard.  Stable without change. Central nervous system: Alert and oriented. No focal neurological deficits. Extremities: Symmetric 5 x 5 power. Skin: No rashes, lesions or ulcers Psychiatry: Judgement and insight appear normal. Mood & affect appropriate.     Data Reviewed: I have personally reviewed following labs and imaging studies  CBC: Recent Labs  Lab 06/02/17 1513 06/03/17 0419 06/04/17 0232  WBC 7.6 6.1 6.1  HGB 12.2 11.8* 11.2*  HCT 38.4 36.9 35.1*  MCV 91.4 91.3 90.7  PLT 201 209 409   Basic Metabolic Panel: Recent Labs  Lab 05/30/17 1346 06/02/17 1513 06/04/17 0232  NA 145 142 140  K 4.5 4.8 3.4*  CL 109 107 102  CO2 29 25 29   GLUCOSE 80 89 108*  BUN 15 14 14   CREATININE 1.18* 1.18* 1.10*  CALCIUM 9.3 9.2 8.5*   Cardiac Enzymes: Recent Labs  Lab 06/02/17 1719 06/02/17 2110  TROPONINI 0.17* 0.17*    Recent Results (from the past 240 hour(s))  Culture, blood (routine x 2) Call MD if unable to obtain prior to antibiotics being given     Status: None (Preliminary result)   Collection Time: 06/02/17  9:00 PM  Result Value Ref Range Status   Specimen Description BLOOD RIGHT ANTECUBITAL  Final   Special Requests IN PEDIATRIC BOTTLE Blood Culture adequate volume  Final   Culture   Final    NO GROWTH < 24 HOURS Performed at [facility], [place] [address], [place], [place] [address]    Report Status PENDING   Incomplete  Culture, blood (routine x 2) Call MD if unable to obtain prior to antibiotics being given     Status: None (Preliminary result)   Collection Time: 06/02/17  9:05 PM  Result Value Ref Range Status   Specimen Description BLOOD RIGHT ANTECUBITAL  Final   Special Requests IN PEDIATRIC BOTTLE Blood Culture adequate volume  Final   Culture   Final    NO GROWTH < 24 HOURS Performed at [facility], [address] [place], [place] [address]    Report Status PENDING  Incomplete         Radiology Studies: Dg Chest 2 View  Result Date: 06/02/2017 CLINICAL DATA:  Patient with shortness of breath. EXAM: CHEST - 2 VIEW COMPARISON:  Chest radiograph 08/15/2010. FINDINGS: Anterior cervical spinal fusion hardware. Stable cardiomegaly. Moderate left and small right pleural effusions with underlying consolidation. Thoracic spine degenerative changes. IMPRESSION: Moderate left and small right pleural effusions with underlying consolidation which may represent atelectasis or infection. Electronically Signed   By: Lovey Newcomer M.D.   On: 06/02/2017 16:30   Ct Angio Chest Pe W Or Wo Contrast  Result Date: 06/03/2017 CLINICAL DATA:  Shortness of breath, elevated D-dimer EXAM: CT ANGIOGRAPHY CHEST WITH CONTRAST TECHNIQUE: Multidetector CT imaging of the chest was performed using the standard protocol during bolus administration of intravenous contrast. Multiplanar CT image reconstructions and MIPs were obtained to evaluate the vascular anatomy. CONTRAST:  134mL ISOVUE-370 IOPAMIDOL (ISOVUE-370) INJECTION 76% COMPARISON:  None. FINDINGS: Cardiovascular: Satisfactory opacification of the pulmonary arteries to the segmental level. No evidence of pulmonary embolism. Normal heart size. No pericardial effusion. Normal caliber thoracic aorta. Reflux of contrast into the hepatic veins. Mediastinum/Nodes: No enlarged mediastinal, hilar, or axillary lymph nodes. Thyroid gland, trachea, and esophagus demonstrate  no significant findings. Lungs/Pleura: Small bilateral pleural effusions. Bibasilar atelectasis. Mild bilateral interstitial prominence and bronchial wall thickening. No pneumothorax. Upper Abdomen: No acute upper abdominal abnormality. Musculoskeletal: No acute osseous abnormality. No aggressive osseous lesion. Levoscoliosis of the thoracic spine. Review of the MIP images confirms the above findings. IMPRESSION: 1. No evidence of pulmonary embolus. 2. Mild pulmonary edema. Electronically Signed   By: Kathreen Devoid   On: 06/03/2017 14:55        Scheduled Meds: . atorvastatin  80 mg Oral q1800  . carvedilol  6.25 mg Oral BID WC  . furosemide  40 mg Intravenous BID  . heparin  5,000 Units Subcutaneous Q8H  . isosorbide-hydrALAZINE  1 tablet Oral TID  . loratadine  10 mg Oral Daily  . sodium chloride flush  3 mL Intravenous Q12H  . sodium chloride flush  3 mL Intravenous Q12H   Continuous Infusions: . sodium chloride    . sodium chloride       LOS: 2 days     Vernell Leep, MD, FACP, [facility]. [facility] Pager [phone]  If 7PM-7AM, please contact night-coverage [email or web address] Password [facility] 06/04/2017, 12:59 PM

## 2017-06-04 NOTE — Progress Notes (Addendum)
Progress Note  Patient Name: Brandy [name] Date of Encounter: 06/04/2017  Primary Cardiologist: Bronson Ing  Subjective   SOB improving but not resolved  Inpatient Medications    Scheduled Meds: . atorvastatin  80 mg Oral q1800  . carvedilol  6.25 mg Oral BID WC  . furosemide  40 mg Intravenous BID  . heparin  5,000 Units Subcutaneous Q8H  . hydrALAZINE  25 mg Oral BID  . loratadine  10 mg Oral Daily  . sodium chloride flush  3 mL Intravenous Q12H  . sodium chloride flush  3 mL Intravenous Q12H   Continuous Infusions: . sodium chloride    . sodium chloride     PRN Meds: sodium chloride, sodium chloride, acetaminophen, ondansetron (ZOFRAN) IV, sodium chloride flush, sodium chloride flush   Vital Signs    Vitals:   06/03/17 2200 06/04/17 0010 06/04/17 0459 06/04/17 0819  BP: (!) 146/95 128/80 133/82 131/77  Pulse: 85 96 82 96  Resp: 19 20 15  (!) 21  Temp:  97.7 F (36.5 C) 97.9 F (36.6 C) (!) 97.4 F (36.3 C)  TempSrc:  Oral Oral Oral  SpO2: 100% 100% 99%   Weight:   169 lb 1.6 oz (76.7 kg)   Height:        Intake/Output Summary (Last 24 hours) at 06/04/2017 1127 Last data filed at 06/04/2017 0500 Gross per 24 hour  Intake 240 ml  Output 2000 ml  Net -1760 ml   Filed Weights   06/03/17 0417 06/03/17 1342 06/04/17 0459  Weight: 171 lb 4.8 oz (77.7 kg) 172 lb 1 oz (78 kg) 169 lb 1.6 oz (76.7 kg)    Telemetry    SR - Personally Reviewed  ECG    n/a  Physical Exam   GEN: No acute distress.   Neck: elevated JVD Cardiac: RRR, no murmurs, rubs, or gallops.  Respiratory: Clear to auscultation bilaterally. GI: Soft, nontender, non-distended  MS: No edema; No deformity. Neuro:  Nonfocal  Psych: Normal affect   Labs    Chemistry Recent Labs  Lab 05/30/17 1346 06/02/17 1513 06/04/17 0232  NA 145 142 140  K 4.5 4.8 3.4*  CL 109 107 102  CO2 29 25 29   GLUCOSE 80 89 108*  BUN 15 14 14   CREATININE 1.18* 1.18* 1.10*  CALCIUM 9.3 9.2 8.5*    GFRNONAA  --  44* 48*  GFRAA  --  51* 56*  ANIONGAP  --  10 9     Hematology Recent Labs  Lab 06/02/17 1513 06/03/17 0419 06/04/17 0232  WBC 7.6 6.1 6.1  RBC 4.20 4.04 3.87  HGB 12.2 11.8* 11.2*  HCT 38.4 36.9 35.1*  MCV 91.4 91.3 90.7  MCH 29.0 29.2 28.9  MCHC 31.8 32.0 31.9  RDW 13.3 13.6 13.4  PLT 201 209 219    Cardiac Enzymes Recent Labs  Lab 06/02/17 1719 06/02/17 2110  TROPONINI 0.17* 0.17*    Recent Labs  Lab 06/02/17 1531  TROPIPOC 0.13*     BNP Recent Labs  Lab 06/02/17 1514  BNP 806.0*     DDimer  Recent Labs  Lab 06/02/17 2110  DDIMER 2.20*     Radiology    Dg Chest 2 View  Result Date: 06/02/2017 CLINICAL DATA:  Patient with shortness of breath. EXAM: CHEST - 2 VIEW COMPARISON:  Chest radiograph 08/15/2010. FINDINGS: Anterior cervical spinal fusion hardware. Stable cardiomegaly. Moderate left and small right pleural effusions with underlying consolidation. Thoracic spine degenerative changes. IMPRESSION: Moderate left  and small right pleural effusions with underlying consolidation which may represent atelectasis or infection. Electronically Signed   By: Lovey Newcomer M.D.   On: 06/02/2017 16:30   Ct Angio Chest Pe W Or Wo Contrast  Result Date: 06/03/2017 CLINICAL DATA:  Shortness of breath, elevated D-dimer EXAM: CT ANGIOGRAPHY CHEST WITH CONTRAST TECHNIQUE: Multidetector CT imaging of the chest was performed using the standard protocol during bolus administration of intravenous contrast. Multiplanar CT image reconstructions and MIPs were obtained to evaluate the vascular anatomy. CONTRAST:  143mL ISOVUE-370 IOPAMIDOL (ISOVUE-370) INJECTION 76% COMPARISON:  None. FINDINGS: Cardiovascular: Satisfactory opacification of the pulmonary arteries to the segmental level. No evidence of pulmonary embolism. Normal heart size. No pericardial effusion. Normal caliber thoracic aorta. Reflux of contrast into the hepatic veins. Mediastinum/Nodes: No enlarged  mediastinal, hilar, or axillary lymph nodes. Thyroid gland, trachea, and esophagus demonstrate no significant findings. Lungs/Pleura: Small bilateral pleural effusions. Bibasilar atelectasis. Mild bilateral interstitial prominence and bronchial wall thickening. No pneumothorax. Upper Abdomen: No acute upper abdominal abnormality. Musculoskeletal: No acute osseous abnormality. No aggressive osseous lesion. Levoscoliosis of the thoracic spine. Review of the MIP images confirms the above findings. IMPRESSION: 1. No evidence of pulmonary embolus. 2. Mild pulmonary edema. Electronically Signed   By: Kathreen Devoid   On: 06/03/2017 14:55    Cardiac Studies     Patient Profile      Brandy [name] is a 77 y.o. female with a hx of HTN, tobacco use, cardiomyopathy, chronic combined systolic and diastolic heart failure with an EF of 40% (initally 20-25% in 2008>recovered by 2011>>now back to 40% as of 05/2017),  hypertensive heart disease and diverticulosis who is being seen today for the evaluation of dyspnea on exertion, recent decrease in EF (40%) with elevated troponin at the request of Dr. Maudie Mercury.    Assessment & Plan  1. Acute on chronic combined systolic/diastolic HF - 11/3816 echo LVEF 35-40%, restrictive diastolic function, mod MR, mild RV dysfunction - 05/2017 cath this admit mild nonobstructive CAD. CI 2.86, mean PA 29, mean PW 20, LVEDP 27 - negative 1.8 liters yesterday, negative 2.6 liters since admission. SHe is on lasix 40mg  bid (received just 1 dose yesterday). Downtrend in Cr with diuresis consistent with venous congestion and CHF, continue IV diuresis. Elevated filling pressures by cath - medical therapy with coreg 6.25mg  bid, hydral 25 bid. History of angioedema on ACE-I, will avoid ARB and entresto a well. Change hydral to bidl.   - continue IV diuresis today.    2. Elevated troponin - mild, cath without obstructive CAD. LIkely secondary to CHF - CT PE negative, mild pulm edema   3.  Daytime somnolence - needs outpatinet sleep study in setting of CHF and signs/symptoms of OSA   For questions or updates, please contact [place] Please consult [email or web address] for contact info under Cardiology/STEMI.      Merrily Pew, MD  06/04/2017, 11:27 AM

## 2017-06-05 ENCOUNTER — Inpatient Hospital Stay (HOSPITAL_COMMUNITY): Payer: Medicare Other

## 2017-06-05 DIAGNOSIS — N189 Chronic kidney disease, unspecified: Secondary | ICD-10-CM

## 2017-06-05 DIAGNOSIS — R0600 Dyspnea, unspecified: Secondary | ICD-10-CM

## 2017-06-05 DIAGNOSIS — R000 Tachycardia, unspecified: Secondary | ICD-10-CM

## 2017-06-05 DIAGNOSIS — N179 Acute kidney failure, unspecified: Secondary | ICD-10-CM

## 2017-06-05 LAB — BASIC METABOLIC PANEL WITH GFR
Anion gap: 11 (ref 5–15)
BUN: 17 mg/dL (ref 6–20)
CO2: 28 mmol/L (ref 22–32)
Calcium: 8.8 mg/dL — ABNORMAL LOW (ref 8.9–10.3)
Chloride: 102 mmol/L (ref 101–111)
Creatinine, Ser: 1.4 mg/dL — ABNORMAL HIGH (ref 0.44–1.00)
GFR calc Af Amer: 42 mL/min — ABNORMAL LOW
GFR calc non Af Amer: 36 mL/min — ABNORMAL LOW
Glucose, Bld: 97 mg/dL (ref 65–99)
Potassium: 3.2 mmol/L — ABNORMAL LOW (ref 3.5–5.1)
Sodium: 141 mmol/L (ref 135–145)

## 2017-06-05 LAB — CBC
HCT: 35 % — ABNORMAL LOW (ref 36.0–46.0)
Hemoglobin: 11.3 g/dL — ABNORMAL LOW (ref 12.0–15.0)
MCH: 29.2 pg (ref 26.0–34.0)
MCHC: 32.3 g/dL (ref 30.0–36.0)
MCV: 90.4 fL (ref 78.0–100.0)
Platelets: 242 K/uL (ref 150–400)
RBC: 3.87 MIL/uL (ref 3.87–5.11)
RDW: 13.7 % (ref 11.5–15.5)
WBC: 8.3 K/uL (ref 4.0–10.5)

## 2017-06-05 LAB — TSH: TSH: 0.91 u[IU]/mL (ref 0.350–4.500)

## 2017-06-05 MED ORDER — POTASSIUM CHLORIDE CRYS ER 20 MEQ PO TBCR
40.0000 meq | EXTENDED_RELEASE_TABLET | Freq: Once | ORAL | Status: AC
  Administered 2017-06-05: 40 meq via ORAL
  Filled 2017-06-05: qty 2

## 2017-06-05 NOTE — Progress Notes (Signed)
Progress Note  Patient Name: Brandy [name] Date of Encounter: 06/05/2017  Primary Cardiologist: Dr Bronson Ing  Subjective   Episode of SOB last night, back to baseline this morning.   Inpatient Medications    Scheduled Meds: . atorvastatin  80 mg Oral q1800  . carvedilol  6.25 mg Oral BID WC  . furosemide  40 mg Intravenous BID  . heparin  5,000 Units Subcutaneous Q8H  . isosorbide-hydrALAZINE  1 tablet Oral TID  . loratadine  10 mg Oral Daily  . sodium chloride flush  3 mL Intravenous Q12H  . sodium chloride flush  3 mL Intravenous Q12H   Continuous Infusions: . sodium chloride    . sodium chloride     PRN Meds: sodium chloride, sodium chloride, acetaminophen, ipratropium-albuterol, ondansetron (ZOFRAN) IV, sodium chloride, sodium chloride flush, sodium chloride flush   Vital Signs    Vitals:   06/04/17 2336 06/05/17 0021 06/05/17 0533 06/05/17 0808  BP:  119/89 109/88 108/76  Pulse:    (!) 110  Resp:  18    Temp:  (!) 97.5 F (36.4 C) 98.8 F (37.1 C)   TempSrc:  Oral Oral   SpO2: 96% 99%    Weight:   166 lb 8 oz (75.5 kg)   Height:        Intake/Output Summary (Last 24 hours) at 06/05/2017 1131 Last data filed at 06/05/2017 0630 Gross per 24 hour  Intake 243 ml  Output 600 ml  Net -357 ml   Filed Weights   06/03/17 1342 06/04/17 0459 06/05/17 0533  Weight: 172 lb 1 oz (78 kg) 169 lb 1.6 oz (76.7 kg) 166 lb 8 oz (75.5 kg)    Telemetry    SR and sinus tach - Personally Reviewed  ECG    na  Physical Exam   GEN: No acute distress.   Neck: No JVD Cardiac: RRR, no murmurs, rubs, or gallops.  Respiratory: decreased breath sounds right base GI: Soft, nontender, non-distended  MS: No edema; No deformity. Neuro:  Nonfocal  Psych: Normal affect   Labs    Chemistry Recent Labs  Lab 06/02/17 1513 06/04/17 0232 06/05/17 0348  NA 142 140 141  K 4.8 3.4* 3.2*  CL 107 102 102  CO2 25 29 28   GLUCOSE 89 108* 97  BUN 14 14 17   CREATININE  1.18* 1.10* 1.40*  CALCIUM 9.2 8.5* 8.8*  GFRNONAA 44* 48* 36*  GFRAA 51* 56* 42*  ANIONGAP 10 9 11      Hematology Recent Labs  Lab 06/03/17 0419 06/04/17 0232 06/05/17 0348  WBC 6.1 6.1 8.3  RBC 4.04 3.87 3.87  HGB 11.8* 11.2* 11.3*  HCT 36.9 35.1* 35.0*  MCV 91.3 90.7 90.4  MCH 29.2 28.9 29.2  MCHC 32.0 31.9 32.3  RDW 13.6 13.4 13.7  PLT 209 219 242    Cardiac Enzymes Recent Labs  Lab 06/02/17 1719 06/02/17 2110  TROPONINI 0.17* 0.17*    Recent Labs  Lab 06/02/17 1531  TROPIPOC 0.13*     BNP Recent Labs  Lab 06/02/17 1514  BNP 806.0*     DDimer  Recent Labs  Lab 06/02/17 2110  DDIMER 2.20*     Radiology    Ct Angio Chest Pe W Or Wo Contrast  Result Date: 06/03/2017 CLINICAL DATA:  Shortness of breath, elevated D-dimer EXAM: CT ANGIOGRAPHY CHEST WITH CONTRAST TECHNIQUE: Multidetector CT imaging of the chest was performed using the standard protocol during bolus administration of intravenous contrast. Multiplanar CT image  reconstructions and MIPs were obtained to evaluate the vascular anatomy. CONTRAST:  163mL ISOVUE-370 IOPAMIDOL (ISOVUE-370) INJECTION 76% COMPARISON:  None. FINDINGS: Cardiovascular: Satisfactory opacification of the pulmonary arteries to the segmental level. No evidence of pulmonary embolism. Normal heart size. No pericardial effusion. Normal caliber thoracic aorta. Reflux of contrast into the hepatic veins. Mediastinum/Nodes: No enlarged mediastinal, hilar, or axillary lymph nodes. Thyroid gland, trachea, and esophagus demonstrate no significant findings. Lungs/Pleura: Small bilateral pleural effusions. Bibasilar atelectasis. Mild bilateral interstitial prominence and bronchial wall thickening. No pneumothorax. Upper Abdomen: No acute upper abdominal abnormality. Musculoskeletal: No acute osseous abnormality. No aggressive osseous lesion. Levoscoliosis of the thoracic spine. Review of the MIP images confirms the above findings. IMPRESSION: 1.  No evidence of pulmonary embolus. 2. Mild pulmonary edema. Electronically Signed   By: Kathreen Devoid   On: 06/03/2017 14:55    Cardiac Studies     Patient Profile     Brandy [name] a 77 y.o.femalewith a hx of HTN, tobacco use, cardiomyopathy, chronic combined systolic and diastolic heart failure with an EF of 40%(initally 20-25% in 2008>recovered by 2011>>now back to 40% as of 05/2017),hypertensive heart disease and diverticulosis who is being seen today for the evaluation ofdyspnea on exertion, recent decrease in EF(40%)with elevated troponinat the request of Dr. Maudie Mercury.    Assessment & Plan    1. Acute on chronic combined systolic/diastolic HF - 05/4740 echo LVEF 35-40%, restrictive diastolic function, mod MR, mild RV dysfunction - 06/03/2017 cath this admit mild nonobstructive CAD. CI 2.86, mean PA 29, mean PW 20, LVEDP 27 - I/Os incomplete yesterday.She is on lasix IV 40mg  bid, received extra dose of 40mg  IV last night with SOB episode. Uptrend in Cr.   - poor I/Os data yesterday, Looks like she is roughly negative 2 liters since her cath where she had elevated filling pressures.  - medical therapy with coreg 6.25mg  bid, bidl. History of angioedema on ACE-I, will avoid ARB and entresto a well due to cross reactivity risk.    - episode of SOB last night. Occurred as she was dosing off. Mild nonspecific chest pain. Some coughing later into the night, no wheezing.  - last CXR showed moderate left and small right effusion, underlying condolidation, ? Atelectasis vs infection. Will repeat 2 view CXR - with uptrend in Cr hold evening lasix.   2. Elevated troponin - mild, cath without obstructive CAD. LIkely secondary to CHF - CT PE negative, mild pulm edema   3. Daytime somnolence - needs outpatinet sleep study in setting of CHF and signs/symptoms of OSA  4. Pleural effusion - repeat CXR  5. Sinus tachycardia - some elevation in heart rates - check orthostatics for  possible hypovlemia. No fever,normal WBC. CT PE previously negative. O2 sats stable, no significant anemia - f/u cxr as well - at this time no clear cause of her uptrend in heart rates but will need to continue to monitor.   For questions or updates, please contact [place] Please consult [email or web address] for contact info under Cardiology/STEMI.      Merrily Pew, MD  06/05/2017, 11:31 AM

## 2017-06-05 NOTE — Progress Notes (Signed)
PROGRESS NOTE   Brandy [name]  WUJ:[id number]    DOB: 1941/01/12    DOA: 06/02/2017  PCP: [name], MD   I have briefly reviewed patients previous medical records in [facility].  Brief Narrative:  77 year old married female, independent of daily activities, not on home oxygen, PMH of cardiomyopathy, chronic combined CHF, HTN, hypertensive heart disease, tobacco abuse, presented with approximately 3 weeks history of progressive dyspnea on minimal exertion and at rest, no chest pain, no leg edema, recently treated for URTI symptoms with a course of Z-Pak with only temporary relief.  Has been relatively sedentary since onset of symptoms.  TTE during recent OP cardiology follow-up showed EF 40% and was supposed to have stress test 06/08/17.  Dyspnea likely multifactorial: Acute bronchitis, decompensated CHF, pleural effusions, rule out PE, pneumonia seems less likely, rule out angina equivalent/CAD.  Cardiology consulted and s/p cardiac cath 3/8.  Treating for decompensated CHF.  Improving.   Assessment & Plan:   Principal Problem:   Dyspnea Active Problems:   HTN (hypertension)   Pneumonia   Pleural effusion   Dyspnea: After extensive workup, felt to be most likely due to decompensated CHF.  CTA chest negative for PE or pneumonia.  Empirically started antibiotics (vancomycin and Levaquin) were discontinued.  Cardiac cath showed mild nonobstructive CAD.  Despite diuresis with IV Lasix for decompensated CHF, again had worsening dyspnea overnight 3/9 and improved after additional dose of IV Lasix.  Cardiology checking repeat chest x-ray.  Acute on chronic combined systolic and diastolic CHF/dilated cardiomyopathy: 2D echo 05/07/17: LVEF 40%, moderate LVH, severe hypokinesis of the inferoseptal myocardium.  BNP on arrival 806.  Cardiology input appreciated, have increased Lasix IV to 40 mg twice daily. Repeat TTE 06/03/17: LVEF 35-40%, diffuse hypokinesis and grade 3 diastolic dysfunction.  -2.9  L thus far but intake output may not be completely accurate.  As per cardiology, elevated filling pressures by cath, continue carvedilol 6.25 mg twice daily, hydralazine changed to BiDil, no ACEI/ARB or Entresto due to angioedema with lisinopril.  Has not had significant peripheral edema.  Received additional dose of IV Lasix overnight due to worsening dyspnea.  Creatinine has bumped up from 1.1-1.4.  Cardiology holding evening dose of Lasix.  Follow chest x-ray.  Elevated troponin: Peaked to 0.17.  No chest pain reported.  Heparin drip started on admission.  Continue carvedilol 6.25 mg twice daily and Lipitor.  Could be demand ischemia versus ACS.  Cardiac cath 3/8 showed mild nonobstructive CAD.  Essential hypertension: Controlled.  Continue carvedilol 6.25 mg twice daily, hydralazine changed to BiDil.  Monitor.  Tobacco abuse: Patient claims to have quit smoking 5 years ago but has been using e-cigarettes.  Possible stage II chronic kidney disease: Last known creatinine prior to this admission was 0.91 on 10/23/08.  Presented with creatinine of 1.18 which has improved to 1.1.  Follow closely while being diuresed IV.  Creatinine increased to 1.4.  Hold evening dose of Lasix and follow BMP in a.m.  Hypokalemia: Secondary to diuresis.  Replace and follow.  Hyperlipidemia: LDL 94.  Continue atorvastatin.  Normocytic anemia: Stable.  Heparin drip discontinued post cath.  History of angioedema to lisinopril: Avoid use of ARB, ACEI or Entresto.  Sinus tachycardia: Unclear etiology.  Recent CTA chest negative for PE or acute process.?  Overdiuresis.  Holding evening dose of Lasix.  Checking chest x-ray and orthostatic blood pressures.  Continue telemetry.  Add TSH to this morning's labs.   DVT prophylaxis: Subcutaneous heparin.  Code Status: Full Family Communication: Discussed in detail with patient's spouse at bedside.  Updated care and answered questions. Disposition: DC home pending clinical  improvement, timing not clear due to change in status overnight 3/9.  Consultants:  Cardiology  Procedures:  None  Antimicrobials:  Vancomycin and levofloxacin-discontinued 3/8   Subjective: Overnight events noted.  States that she went to bed at about 10:30 PM but aroused at midnight with worsening dyspnea, some left-sided chest discomfort and sweating.  Gradually improved after a dose of IV Lasix.  Dyspnea better this morning but not as good as it was yesterday morning.  ROS: As above.  Objective:  Vitals:   06/05/17 0021 06/05/17 0533 06/05/17 0808 06/05/17 1154  BP: 119/89 109/88 108/76 116/69  Pulse:   (!) 110 95  Resp: 18     Temp: (!) 97.5 F (36.4 C) 98.8 F (37.1 C)    TempSrc: Oral Oral    SpO2: 99%     Weight:  75.5 kg (166 lb 8 oz)    Height:        Examination:  General exam: Pleasant elderly female, moderately built and nourished, sitting up comfortably in bed.  Does not appear in any distress. Respiratory system: Few bibasilar crackles but otherwise clear to auscultation without wheezing or rhonchi.  No increased work of breathing. Cardiovascular system: S1 and S2 heard, regular mild tachycardia.  No JVD, murmurs or pedal edema.  Telemetry personally reviewed: Sinus tachycardia mostly in the 100s, occasionally in the 130s. Gastrointestinal system: Abdomen is nondistended, soft and nontender. No organomegaly or masses felt. Normal bowel sounds heard.  Stable without change. Central nervous system: Alert and oriented. No focal neurological deficits. Extremities: Symmetric 5 x 5 power. Skin: No rashes, lesions or ulcers Psychiatry: Judgement and insight appear normal. Mood & affect appropriate.     Data Reviewed: I have personally reviewed following labs and imaging studies  CBC: Recent Labs  Lab 06/02/17 1513 06/03/17 0419 06/04/17 0232 06/05/17 0348  WBC 7.6 6.1 6.1 8.3  HGB 12.2 11.8* 11.2* 11.3*  HCT 38.4 36.9 35.1* 35.0*  MCV 91.4 91.3 90.7  90.4  PLT 201 209 219 761   Basic Metabolic Panel: Recent Labs  Lab 05/30/17 1346 06/02/17 1513 06/04/17 0232 06/05/17 0348  NA 145 142 140 141  K 4.5 4.8 3.4* 3.2*  CL 109 107 102 102  CO2 29 25 29 28   GLUCOSE 80 89 108* 97  BUN 15 14 14 17   CREATININE 1.18* 1.18* 1.10* 1.40*  CALCIUM 9.3 9.2 8.5* 8.8*   Cardiac Enzymes: Recent Labs  Lab 06/02/17 1719 06/02/17 2110  TROPONINI 0.17* 0.17*    Recent Results (from the past 240 hour(s))  Culture, blood (routine x 2) Call MD if unable to obtain prior to antibiotics being given     Status: None (Preliminary result)   Collection Time: 06/02/17  9:00 PM  Result Value Ref Range Status   Specimen Description BLOOD RIGHT ANTECUBITAL  Final   Special Requests IN PEDIATRIC BOTTLE Blood Culture adequate volume  Final   Culture   Final    NO GROWTH 2 DAYS Performed at [facility], [place] [address], [place], [place] [address]    Report Status PENDING  Incomplete  Culture, blood (routine x 2) Call MD if unable to obtain prior to antibiotics being given     Status: None (Preliminary result)   Collection Time: 06/02/17  9:05 PM  Result Value Ref Range Status   Specimen  Description BLOOD RIGHT ANTECUBITAL  Final   Special Requests IN PEDIATRIC BOTTLE Blood Culture adequate volume  Final   Culture   Final    NO GROWTH 2 DAYS Performed at [facility], [address] [place], [place] [address]    Report Status PENDING  Incomplete         Radiology Studies: Ct Angio Chest Pe W Or Wo Contrast  Result Date: 06/03/2017 CLINICAL DATA:  Shortness of breath, elevated D-dimer EXAM: CT ANGIOGRAPHY CHEST WITH CONTRAST TECHNIQUE: Multidetector CT imaging of the chest was performed using the standard protocol during bolus administration of intravenous contrast. Multiplanar CT image reconstructions and MIPs were obtained to evaluate the vascular anatomy. CONTRAST:  178mL ISOVUE-370 IOPAMIDOL (ISOVUE-370) INJECTION 76% COMPARISON:   None. FINDINGS: Cardiovascular: Satisfactory opacification of the pulmonary arteries to the segmental level. No evidence of pulmonary embolism. Normal heart size. No pericardial effusion. Normal caliber thoracic aorta. Reflux of contrast into the hepatic veins. Mediastinum/Nodes: No enlarged mediastinal, hilar, or axillary lymph nodes. Thyroid gland, trachea, and esophagus demonstrate no significant findings. Lungs/Pleura: Small bilateral pleural effusions. Bibasilar atelectasis. Mild bilateral interstitial prominence and bronchial wall thickening. No pneumothorax. Upper Abdomen: No acute upper abdominal abnormality. Musculoskeletal: No acute osseous abnormality. No aggressive osseous lesion. Levoscoliosis of the thoracic spine. Review of the MIP images confirms the above findings. IMPRESSION: 1. No evidence of pulmonary embolus. 2. Mild pulmonary edema. Electronically Signed   By: Kathreen Devoid   On: 06/03/2017 14:55        Scheduled Meds: . atorvastatin  80 mg Oral q1800  . carvedilol  6.25 mg Oral BID WC  . heparin  5,000 Units Subcutaneous Q8H  . isosorbide-hydrALAZINE  1 tablet Oral TID  . loratadine  10 mg Oral Daily  . sodium chloride flush  3 mL Intravenous Q12H  . sodium chloride flush  3 mL Intravenous Q12H   Continuous Infusions: . sodium chloride    . sodium chloride       LOS: 3 days     Vernell Leep, MD, FACP, [facility]. [facility] Pager [phone]  If 7PM-7AM, please contact night-coverage [email or web address] Password [facility] 06/05/2017, 12:37 PM

## 2017-06-05 NOTE — Progress Notes (Signed)
Patient apprehensive due to increased feeling of shortness of breath. Patient O2 increased to 2.5 L. Head of bed elevated. Dr. Silas [place] contacted and ordered extra dose of Lasix 40mg  IV. Explained to patient that medication needed time to work to reduce her symptoms. Will continue to monitor patient. Lajoyce Corners, RN

## 2017-06-06 ENCOUNTER — Encounter (HOSPITAL_COMMUNITY): Payer: Self-pay | Admitting: Interventional Cardiology

## 2017-06-06 LAB — BASIC METABOLIC PANEL
ANION GAP: 10 (ref 5–15)
BUN: 16 mg/dL (ref 6–20)
CALCIUM: 8.8 mg/dL — AB (ref 8.9–10.3)
CO2: 29 mmol/L (ref 22–32)
Chloride: 103 mmol/L (ref 101–111)
Creatinine, Ser: 1.32 mg/dL — ABNORMAL HIGH (ref 0.44–1.00)
GFR, EST AFRICAN AMERICAN: 45 mL/min — AB (ref >60)
GFR, EST NON AFRICAN AMERICAN: 39 mL/min — AB (ref >60)
Glucose, Bld: 108 mg/dL — ABNORMAL HIGH (ref 65–99)
POTASSIUM: 3.3 mmol/L — AB (ref 3.5–5.1)
SODIUM: 142 mmol/L (ref 135–145)

## 2017-06-06 LAB — MAGNESIUM: Magnesium: 1.7 mg/dL (ref 1.7–2.4)

## 2017-06-06 MED ORDER — CARVEDILOL 12.5 MG PO TABS
12.5000 mg | ORAL_TABLET | Freq: Two times a day (BID) | ORAL | Status: DC
  Administered 2017-06-06: 12.5 mg via ORAL
  Filled 2017-06-06: qty 1

## 2017-06-06 MED ORDER — CARVEDILOL 12.5 MG PO TABS
12.5000 mg | ORAL_TABLET | Freq: Two times a day (BID) | ORAL | Status: DC
  Administered 2017-06-06 – 2017-06-07 (×2): 12.5 mg via ORAL
  Filled 2017-06-06 (×2): qty 1

## 2017-06-06 MED ORDER — CARVEDILOL 6.25 MG PO TABS: 6.2500 mg | ORAL_TABLET | Freq: Two times a day (BID) | ORAL | Status: DC

## 2017-06-06 MED ORDER — FUROSEMIDE 40 MG PO TABS
40.0000 mg | ORAL_TABLET | Freq: Every day | ORAL | Status: DC
  Administered 2017-06-06 – 2017-06-07 (×2): 40 mg via ORAL
  Filled 2017-06-06 (×2): qty 1

## 2017-06-06 MED ORDER — POTASSIUM CHLORIDE CRYS ER 20 MEQ PO TBCR
40.0000 meq | EXTENDED_RELEASE_TABLET | Freq: Two times a day (BID) | ORAL | Status: AC
  Administered 2017-06-06 (×2): 40 meq via ORAL
  Filled 2017-06-06 (×2): qty 2

## 2017-06-06 NOTE — Progress Notes (Addendum)
Notified by CCMD that patient's HR was in the 140's (145-147 at 1502).  Checked on pt who had ambulated to the restroom.  Upon rest, patient's HR was 127-128 and pt was asymptomatic.  Paged Fabian Sharp, PA (Cardiology) to notify of tachycardic event.  Fabian Sharp, PA placed an order for 12.5 mg Coreg for 2000 if HR still high and bp adequate for an additional dose.  Will continue to monitor patient's HR and signs and symptoms of tachycardia.

## 2017-06-06 NOTE — Progress Notes (Addendum)
Offer to ambulate patient and patient refused ambulation at 10:30 am, 1:30 pm, 3:30 pm, and 4:30 pm.

## 2017-06-06 NOTE — Progress Notes (Addendum)
Progress Note  Patient Name: Brandy [name] Date of Encounter: 06/06/2017  Primary Cardiologist: Dr Bronson Ing  Subjective   Pt states she is breathing better, she is on room air. She does not have LE edema.   Inpatient Medications    Scheduled Meds: . atorvastatin  80 mg Oral q1800  . carvedilol  6.25 mg Oral BID WC  . heparin  5,000 Units Subcutaneous Q8H  . isosorbide-hydrALAZINE  1 tablet Oral TID  . loratadine  10 mg Oral Daily  . potassium chloride  40 mEq Oral BID  . sodium chloride flush  3 mL Intravenous Q12H  . sodium chloride flush  3 mL Intravenous Q12H   Continuous Infusions: . sodium chloride    . sodium chloride     PRN Meds: sodium chloride, sodium chloride, acetaminophen, ipratropium-albuterol, ondansetron (ZOFRAN) IV, sodium chloride, sodium chloride flush, sodium chloride flush   Vital Signs    Vitals:   06/06/17 0240 06/06/17 0248 06/06/17 0736 06/06/17 0800  BP:  122/88 127/67   Pulse:  98 (!) 125 (!) 132  Resp:  16 16 (!) 23  Temp:  98.8 F (37.1 C) 99.4 F (37.4 C)   TempSrc:  Oral Oral   SpO2:  94% 92% 96%  Weight: 165 lb 12.6 oz (75.2 kg)     Height:       No intake or output data in the 24 hours ending 06/06/17 0836 Filed Weights   06/04/17 0459 06/05/17 0533 06/06/17 0240  Weight: 169 lb 1.6 oz (76.7 kg) 166 lb 8 oz (75.5 kg) 165 lb 12.6 oz (75.2 kg)     Physical Exam   General: Well developed, well nourished, female appearing in no acute distress. Head: Normocephalic, atraumatic.  Neck: Supple without bruits, no JVD Lungs:  Resp regular and unlabored, CTA in upper lobes, but diminished in left base Heart: regular rhythm, tachycardic rate, S1, S2, no S3, S4, or murmur; no rub. Abdomen: Soft, non-tender, non-distended with normoactive bowel sounds. No hepatomegaly. No rebound/guarding. No obvious abdominal masses. Extremities: No clubbing, cyanosis, no edema. Distal pedal pulses are 2+ bilaterally. Neuro: Alert and oriented X  3. Moves all extremities spontaneously. Psych: Normal affect.  Labs    Chemistry Recent Labs  Lab 06/04/17 0232 06/05/17 0348 06/06/17 0246  NA 140 141 142  K 3.4* 3.2* 3.3*  CL 102 102 103  CO2 29 28 29   GLUCOSE 108* 97 108*  BUN 14 17 16   CREATININE 1.10* 1.40* 1.32*  CALCIUM 8.5* 8.8* 8.8*  GFRNONAA 48* 36* 39*  GFRAA 56* 42* 45*  ANIONGAP 9 11 10      Hematology Recent Labs  Lab 06/03/17 0419 06/04/17 0232 06/05/17 0348  WBC 6.1 6.1 8.3  RBC 4.04 3.87 3.87  HGB 11.8* 11.2* 11.3*  HCT 36.9 35.1* 35.0*  MCV 91.3 90.7 90.4  MCH 29.2 28.9 29.2  MCHC 32.0 31.9 32.3  RDW 13.6 13.4 13.7  PLT 209 219 242    Cardiac Enzymes Recent Labs  Lab 06/02/17 1719 06/02/17 2110  TROPONINI 0.17* 0.17*    Recent Labs  Lab 06/02/17 1531  TROPIPOC 0.13*     BNP Recent Labs  Lab 06/02/17 1514  BNP 806.0*     DDimer  Recent Labs  Lab 06/02/17 2110  DDIMER 2.20*     Radiology    Dg Chest 2 View  Result Date: 06/05/2017 CLINICAL DATA:  Shortness of Breath EXAM: CHEST - 2 VIEW COMPARISON:  06/02/2017 FINDINGS: Small left pleural effusion  with left base atelectasis. Mild cardiomegaly. Left effusion is decreased since prior study. No focal opacity on the right. No acute bony abnormality. IMPRESSION: Small left pleural effusion and left base atelectasis, improving since prior study. Cardiomegaly. Electronically Signed   By: Rolm Baptise M.D.   On: 06/05/2017 13:51     Telemetry    Sinus tachycardia - Personally Reviewed  ECG    Sinus tachycardia - Personally Reviewed   Cardiac Studies   Right and left heart cath 06/03/17:  Colon Flattery 2nd Diag to 2nd Diag lesion is 25% stenosed.  Mid RCA lesion is 25% stenosed.  LV end diastolic pressure is moderately elevated.  There is no aortic valve stenosis.  LV end diastolic pressure is normal.  Hemodynamic findings consistent with mild pulmonary hypertension.  CO 5.2 L/min; CI 2.86; Ao 94%; PA sat 65%, mean PA 29  mm Hg; PCWP 20 mm Hg   Mild, nonobstructive CAD.  Mildly elevated right heart pressures with volume overload.  Continue medical therapy. EF not assessed to minimize contrast exposure.   Echo 06/03/17: Study Conclusions - Left ventricle: The cavity size was normal. Wall thickness was   increased in a pattern of moderate LVH. Systolic function was   moderately reduced. The estimated ejection fraction was in the   range of 35% to 40%. Diffuse hypokinesis. Doppler parameters are   consistent with restrictive left ventricular relaxation (grade 3   diastolic dysfunction). The E/e&' ratio is >20, suggesting   markedly elevated LV filling pressure. - Mitral valve: Mildly thickened leaflets . There was moderate   regurgitation. - Left atrium: The atrium was moderately dilated. - Right ventricle: The cavity size was normal. Mildly reduced   systolic function. - Tricuspid valve: There was mild regurgitation. - Pulmonary arteries: PA peak pressure: 38 mm Hg (S). - Inferior vena cava: The vessel was dilated. The respirophasic   diameter changes were blunted (< 50%), consistent with elevated   central venous pressure. - Pericardium, extracardiac: A trivial pericardial effusion was   identified. There was a left pleural effusion.  Impressions: - Compared to a prior study in 04/2017, the LVEF is lower at 35-40%.   There is grade 3 DD with high LV filling pressure. A trivial   pericardial effusion and left pleural effusion is noted.   ECHO: 05/07/17 Study Conclusions - Left ventricle: The cavity size was normal. Wall thickness was increased in a pattern of moderate LVH. The estimated ejection fraction was 40%. Diffuse hypokinesis. There is severe hypokinesis of the inferoseptal myocardium. Abnormal global longitudinal strain of -12.4%. The study is not technically sufficient to allow evaluation of LV diastolic function. - Aortic valve: Mildly calcified annulus. Trileaflet. -  Mitral valve: Mildly thickened leaflets . There was mild to moderate regurgitation. - Left atrium: The atrium was moderately to severely dilated. - Right atrium: Central venous pressure (est): 3 mm Hg. - Atrial septum: No defect or patent foramen ovale was identified. - Tricuspid valve: There was mild regurgitation. - Pulmonary arteries: PA peak pressure: 28 mm Hg (S). - Pericardium, extracardiac: There was no pericardial effusion.   Patient Profile     77 y.o. female with a hx of HTN, tobacco use, cardiomyopathy, chronic combined systolic and diastolic heart failure with an EF of 40%(initally 20-25% in 2008>recovered by 2011>>now back to 40% as of 05/2017),hypertensive heart disease and diverticulosis who is being seen today for the evaluation ofdyspnea on exertion, recent decrease in EF(40%)with elevated troponin  Assessment & Plan  1. Acute on chronic systolic and diastolic heart failure, SOB She was diuresing on 40 mg IV lasix BID, but had a bump in creatinine prompting providers to hold lasix last evening. sCr trending down 1.32 (1.40). I and Os have not been charted. She is 165 lbs today, down from 170lbs. Her last weight in clinic was 172 lbs; however, echo showed mild decrease in LVEF to 40% at that time and she was likely not at her dry weight. Repeat echo here with LVEF 35-40% and heart cath with nonobstructive disease. No further ischemic workup at this time. She does not appear extremely volume overloaded on exam. CXR with decreasing pleural effusion, per report.   2. Hypokalemia K 3.3 (3.2), replace K per IM.   3. Elevated troponin Troponin: 0.17 --> 0.17 Heart cath with nonobstructive disease. No further workup. This troponin level is demand ischemia.    4. Sinus tachycardia HR 120-130s. EKG with sinus tachycardia. Hb 11.3, stable. No signs of bleeding. Increased her coreg to 12.5 mg BID. Question dehydration. Mg WNL. TSH was normal.   Signed, Ledora Bottcher , PA-C 8:36 AM 06/06/2017 Pager: [phone]   I have seen and examined the patient along with Ledora Bottcher , PA-C.  I have reviewed the chart, notes and new data.  I agree with PA/NP's note.  Key new complaints: Breathing is back to baseline, edema has resolved completely.  Denies dizziness or lightheadedness. Key examination changes: Does not have any ankle edema or jugular venous distention, clear lungs Key new findings / data: Slightly tachycardic earlier today, but now heart rate down to the 90s.  PLAN: Resume a low dose of oral diuretics, but higher than she was taking at home prior to this admission.  I would recommend monitoring for 24 hours to make sure renal function continues to improve. Her tachycardia might be due to the use of vasodilators (isosorbide and hydralazine), rather than hypovolemia.  Agree with the increase in dose of carvedilol. Anticipate discharge tomorrow.  Sanda Klein, MD, [place] [phone] 06/06/2017, 11:47 AM

## 2017-06-06 NOTE — Progress Notes (Signed)
PROGRESS NOTE   Brandy [name]  ION:[id number]    DOB: 07-21-1940    DOA: 06/02/2017  PCP: [name], MD   I have briefly reviewed patients previous medical records in [facility].  Brief Narrative:  77 year old married female, independent of daily activities, not on home oxygen, PMH of cardiomyopathy, chronic combined CHF, HTN, hypertensive heart disease, tobacco abuse, presented with approximately 3 weeks history of progressive dyspnea on minimal exertion and at rest, no chest pain, no leg edema, recently treated for URTI symptoms with a course of Z-Pak with only temporary relief.  Has been relatively sedentary since onset of symptoms.  TTE during recent OP cardiology follow-up showed EF 40% and was supposed to have stress test 06/08/17.  Dyspnea likely multifactorial: Acute bronchitis, decompensated CHF, pleural effusions, rule out PE, pneumonia seems less likely, rule out angina equivalent/CAD.  Cardiology consulted and s/p cardiac cath 3/8.  Treating for decompensated CHF.  Improving.   Assessment & Plan:   Principal Problem:   Dyspnea Active Problems:   HTN (hypertension)   Pneumonia   Pleural effusion   Dyspnea: After extensive workup, felt to be most likely due to decompensated CHF.  CTA chest negative for PE or pneumonia.  Empirically started antibiotics (vancomycin and Levaquin) were discontinued.  Cardiac cath showed mild nonobstructive CAD.  Despite diuresis with IV Lasix for decompensated CHF, again had worsening dyspnea overnight 3/9 and improved after additional dose of IV Lasix.  Chest x-ray 3/10: Small left pleural effusion and left basilar atelectasis, personally reviewed images.  Improved.  Acute on chronic combined systolic and diastolic CHF/dilated cardiomyopathy: 2D echo 05/07/17: LVEF 40%, moderate LVH, severe hypokinesis of the inferoseptal myocardium.  BNP on arrival 806.  Cardiology input appreciated, increased Lasix IV to 40 mg twice daily. Repeat TTE 06/03/17: LVEF  35-40%, diffuse hypokinesis and grade 3 diastolic dysfunction.  Intake output inaccurate.  As per cardiology, elevated filling pressures by cath, increased carvedilol to 12.5 mg twice daily, hydralazine changed to BiDil, no ACEI/ARB or Entresto due to angioedema with lisinopril.  IV Lasix held after a.m. dose 3/10 due to worsening creatinine.  Clinically improved and appears euvolemic.  Lasix started at higher than home dose, 40 mg daily.  Monitor overnight to make sure renal functions are stable prior to discharge home.  Elevated troponin: Peaked to 0.17.  No chest pain reported.  Heparin drip started on admission.  Continue carvedilol 6.25 mg twice daily and Lipitor.  Could be demand ischemia versus ACS.  Cardiac cath 3/8 showed mild nonobstructive CAD.  Essential hypertension: Controlled.  Increase carvedilol to 12.5 mg twice daily, hydralazine changed to BiDil.  Stable.  Tobacco abuse: Patient claims to have quit smoking 5 years ago but has been using e-cigarettes.  Possible stage II chronic kidney disease: Last known creatinine prior to this admission was 0.91 on 10/23/08.  Presented with creatinine of 1.18 which has improved to 1.1.  Follow closely while being diuresed IV.  Creatinine had increased to 1.4 on 3/10 and IV Lasix discontinued.  Creatinine down to 1.3.  Resuming oral Lasix 40 daily.  Follow BMP in a.m.  Hypokalemia: Secondary to diuresis.  Persisting.  Replace and follow.  Magnesium 1.7.  Hyperlipidemia: LDL 94.  Continue atorvastatin.  Normocytic anemia: Stable.  Heparin drip discontinued post cath.  History of angioedema to lisinopril: Avoid use of ARB, ACEI or Entresto.  Sinus tachycardia: Recent CTA chest negative for PE or acute process.  Chest x-ray without acute process 3/10.  Negative  orthostatic changes.  Felt to be due to vasodilators (BiDil) rather than hypovolemia.  Carvedilol increased to 12.5 mg twice daily.  DVT prophylaxis: Subcutaneous heparin. Code Status:  Full Family Communication: Discussed in detail with patient's spouse at bedside.  Updated care and answered questions. Disposition: DC home possibly 3/12 pending improvement/stability and creatinine and dyspnea.  Consultants:  Cardiology  Procedures:  None  Antimicrobials:  Vancomycin and levofloxacin-discontinued 3/8   Subjective: Reports that on several nights she has nasal stuffiness, head congestion even at home.  No dyspnea reported.  Denies any other complaints.  ROS: As above.  Objective:  Vitals:   06/06/17 0248 06/06/17 0736 06/06/17 0800 06/06/17 1207  BP: 122/88 127/67  104/69  Pulse: 98 (!) 125 (!) 132 (!) 114  Resp: 16 16 (!) 23 16  Temp: 98.8 F (37.1 C) 99.4 F (37.4 C)  97.9 F (36.6 C)  TempSrc: Oral Oral  Oral  SpO2: 94% 92% 96% 94%  Weight:      Height:        Examination:  General exam: Pleasant elderly female, moderately built and nourished, sitting up comfortably in bed.  Does not appear in any distress. Respiratory system: Occasional left basal crackles but otherwise clear to auscultation.  No increased work of breathing. Cardiovascular system: S1 and S2 heard, regular mild tachycardia.  No JVD, murmurs or pedal edema.  Telemetry personally reviewed: SR- ST in the 110s. Gastrointestinal system: Abdomen is nondistended, soft and nontender. No organomegaly or masses felt. Normal bowel sounds heard.  Stable without change. Central nervous system: Alert and oriented. No focal neurological deficits.  Stable without change. Extremities: Symmetric 5 x 5 power. Skin: No rashes, lesions or ulcers Psychiatry: Judgement and insight appear normal. Mood & affect appropriate.     Data Reviewed: I have personally reviewed following labs and imaging studies  CBC: Recent Labs  Lab 06/02/17 1513 06/03/17 0419 06/04/17 0232 06/05/17 0348  WBC 7.6 6.1 6.1 8.3  HGB 12.2 11.8* 11.2* 11.3*  HCT 38.4 36.9 35.1* 35.0*  MCV 91.4 91.3 90.7 90.4  PLT 201 209  219 956   Basic Metabolic Panel: Recent Labs  Lab 05/30/17 1346 06/02/17 1513 06/04/17 0232 06/05/17 0348 06/06/17 0246  NA 145 142 140 141 142  K 4.5 4.8 3.4* 3.2* 3.3*  CL 109 107 102 102 103  CO2 29 25 29 28 29   GLUCOSE 80 89 108* 97 108*  BUN 15 14 14 17 16   CREATININE 1.18* 1.18* 1.10* 1.40* 1.32*  CALCIUM 9.3 9.2 8.5* 8.8* 8.8*  MG  --   --   --   --  1.7   Cardiac Enzymes: Recent Labs  Lab 06/02/17 1719 06/02/17 2110  TROPONINI 0.17* 0.17*    Recent Results (from the past 240 hour(s))  Culture, blood (routine x 2) Call MD if unable to obtain prior to antibiotics being given     Status: None (Preliminary result)   Collection Time: 06/02/17  9:00 PM  Result Value Ref Range Status   Specimen Description BLOOD RIGHT ANTECUBITAL  Final   Special Requests IN PEDIATRIC BOTTLE Blood Culture adequate volume  Final   Culture   Final    NO GROWTH 3 DAYS Performed at [facility], [place] [address], [place], [place] [address]    Report Status PENDING  Incomplete  Culture, blood (routine x 2) Call MD if unable to obtain prior to antibiotics being given     Status: None (Preliminary result)  Collection Time: 06/02/17  9:05 PM  Result Value Ref Range Status   Specimen Description BLOOD RIGHT ANTECUBITAL  Final   Special Requests IN PEDIATRIC BOTTLE Blood Culture adequate volume  Final   Culture   Final    NO GROWTH 3 DAYS Performed at [facility], [place] [address], [place],  [address]    Report Status PENDING  Incomplete         Radiology Studies: Dg Chest 2 View  Result Date: 06/05/2017 CLINICAL DATA:  Shortness of Breath EXAM: CHEST - 2 VIEW COMPARISON:  06/02/2017 FINDINGS: Small left pleural effusion with left base atelectasis. Mild cardiomegaly. Left effusion is decreased since prior study. No focal opacity on the right. No acute bony abnormality. IMPRESSION: Small left pleural effusion and left base atelectasis, improving since prior study.  Cardiomegaly. Electronically Signed   By: Rolm Baptise M.D.   On: 06/05/2017 13:51        Scheduled Meds: . atorvastatin  80 mg Oral q1800  . carvedilol  12.5 mg Oral BID WC  . furosemide  40 mg Oral Daily  . heparin  5,000 Units Subcutaneous Q8H  . isosorbide-hydrALAZINE  1 tablet Oral TID  . loratadine  10 mg Oral Daily  . potassium chloride  40 mEq Oral BID  . sodium chloride flush  3 mL Intravenous Q12H  . sodium chloride flush  3 mL Intravenous Q12H   Continuous Infusions: . sodium chloride    . sodium chloride       LOS: 4 days     Vernell Leep, MD, FACP, [facility]. [facility] Pager [phone]  If 7PM-7AM, please contact night-coverage [email or web address] Password [facility] 06/06/2017, 12:39 PM

## 2017-06-06 NOTE — Care Management Important Message (Signed)
Important Message  Patient Details  Name: Phala Schraeder MRN: [id number] Date of Birth: November 19, 1940   Medicare Important Message Given:  Yes    [name] Breezy Hertenstein 06/06/2017, 12:11 PM

## 2017-06-07 LAB — BASIC METABOLIC PANEL
Anion gap: 7 (ref 5–15)
BUN: 12 mg/dL (ref 6–20)
CHLORIDE: 104 mmol/L (ref 101–111)
CO2: 29 mmol/L (ref 22–32)
CREATININE: 1.22 mg/dL — AB (ref 0.44–1.00)
Calcium: 8.8 mg/dL — ABNORMAL LOW (ref 8.9–10.3)
GFR calc non Af Amer: 43 mL/min — ABNORMAL LOW (ref >60)
GFR, EST AFRICAN AMERICAN: 49 mL/min — AB (ref >60)
Glucose, Bld: 99 mg/dL (ref 65–99)
Potassium: 4.2 mmol/L (ref 3.5–5.1)
Sodium: 140 mmol/L (ref 135–145)

## 2017-06-07 LAB — CULTURE, BLOOD (ROUTINE X 2)
CULTURE: NO GROWTH
CULTURE: NO GROWTH
SPECIAL REQUESTS: ADEQUATE
Special Requests: ADEQUATE

## 2017-06-07 MED ORDER — POTASSIUM CHLORIDE CRYS ER 20 MEQ PO TBCR
40.0000 meq | EXTENDED_RELEASE_TABLET | Freq: Every day | ORAL | Status: DC
  Administered 2017-06-07: 40 meq via ORAL
  Filled 2017-06-07: qty 2

## 2017-06-07 MED ORDER — POTASSIUM CHLORIDE CRYS ER 20 MEQ PO TBCR: 40.0000 meq | EXTENDED_RELEASE_TABLET | Freq: Every day | ORAL | 3 refills | Status: DC

## 2017-06-07 MED ORDER — CARVEDILOL 12.5 MG PO TABS: 12.5000 mg | ORAL_TABLET | Freq: Two times a day (BID) | ORAL | 0 refills | Status: DC

## 2017-06-07 MED ORDER — LIVING BETTER WITH HEART FAILURE BOOK
Freq: Once | Status: AC
  Administered 2017-06-07: 11:00:00

## 2017-06-07 MED ORDER — ISOSORB DINITRATE-HYDRALAZINE 20-37.5 MG PO TABS: 1.0000 | ORAL_TABLET | Freq: Three times a day (TID) | ORAL | 0 refills | Status: DC

## 2017-06-07 MED ORDER — FUROSEMIDE 40 MG PO TABS: 40.0000 mg | ORAL_TABLET | Freq: Every day | ORAL | 0 refills | Status: DC

## 2017-06-07 NOTE — Progress Notes (Addendum)
Progress Note  Patient Name: Brandy [name] Date of Encounter: 06/07/2017  Primary Cardiologist: Dr. Bronson Ing  Subjective   Feeling much better overall today. Occasionally feels SOB, but this is much improved. Has not had any problems with ambulation. Temp 100.2 overnight -> 99.4 -> 99.3 but room is quite warm. Has had nonproductive cough for several days which she says is not any worse recently (was present upon admission). No phlegm production, chills, myalgias, LEE, dysuria.  Inpatient Medications    Scheduled Meds: . atorvastatin  80 mg Oral q1800  . carvedilol  12.5 mg Oral BID WC  . furosemide  40 mg Oral Daily  . heparin  5,000 Units Subcutaneous Q8H  . isosorbide-hydrALAZINE  1 tablet Oral TID  . loratadine  10 mg Oral Daily  . sodium chloride flush  3 mL Intravenous Q12H  . sodium chloride flush  3 mL Intravenous Q12H   Continuous Infusions: . sodium chloride    . sodium chloride     PRN Meds: sodium chloride, sodium chloride, acetaminophen, ipratropium-albuterol, ondansetron (ZOFRAN) IV, sodium chloride, sodium chloride flush, sodium chloride flush   Vital Signs    Vitals:   06/06/17 2229 06/07/17 0015 06/07/17 0400 06/07/17 0619  BP: 118/72 116/75 (!) 103/59   Pulse: (!) 117 93 94   Resp: 20 18 14    Temp:  100.2 F (37.9 C) 99.4 F (37.4 C)   TempSrc:  Oral Oral   SpO2: 97% 97% 92%   Weight:    166 lb 4.8 oz (75.4 kg)  Height:        Intake/Output Summary (Last 24 hours) at 06/07/2017 0737 Last data filed at 06/06/2017 1900 Gross per 24 hour  Intake 480 ml  Output -  Net 480 ml   Filed Weights   06/05/17 0533 06/06/17 0240 06/07/17 0619  Weight: 166 lb 8 oz (75.5 kg) 165 lb 12.6 oz (75.2 kg) 166 lb 4.8 oz (75.4 kg)    Telemetry    Sinus tach low 100s - Personally Reviewed  Physical Exam   GEN: No acute distress.  HEENT: Normocephalic, atraumatic, sclera non-icteric. Neck: No JVD or bruits. Cardiac: Reg rhythm borderline tachycardic no  murmurs, rubs, or gallops.  Radials/DP/PT 1+ and equal bilaterally.  Respiratory: Clear to auscultation bilaterally. Breathing is unlabored. GI: Soft, nontender, non-distended, BS +x 4. MS: no deformity. Extremities: No clubbing or cyanosis. No edema. Distal pedal pulses are 2+ and equal bilaterally. Right radial cath site without hematoma or ecchymosis; good pulse. Neuro:  AAOx3. Follows commands. Psych:  Responds to questions appropriately with a normal affect.  Labs    Chemistry Recent Labs  Lab 06/05/17 0348 06/06/17 0246 06/07/17 0223  NA 141 142 140  K 3.2* 3.3* 4.2  CL 102 103 104  CO2 28 29 29   GLUCOSE 97 108* 99  BUN 17 16 12   CREATININE 1.40* 1.32* 1.22*  CALCIUM 8.8* 8.8* 8.8*  GFRNONAA 36* 39* 43*  GFRAA 42* 45* 49*  ANIONGAP 11 10 7      Hematology Recent Labs  Lab 06/03/17 0419 06/04/17 0232 06/05/17 0348  WBC 6.1 6.1 8.3  RBC 4.04 3.87 3.87  HGB 11.8* 11.2* 11.3*  HCT 36.9 35.1* 35.0*  MCV 91.3 90.7 90.4  MCH 29.2 28.9 29.2  MCHC 32.0 31.9 32.3  RDW 13.6 13.4 13.7  PLT 209 219 242    Cardiac Enzymes Recent Labs  Lab 06/02/17 1719 06/02/17 2110  TROPONINI 0.17* 0.17*    Recent Labs  Lab 06/02/17  1531  TROPIPOC 0.13*     BNP Recent Labs  Lab 06/02/17 1514  BNP 806.0*     DDimer  Recent Labs  Lab 06/02/17 2110  DDIMER 2.20*     Radiology    Dg Chest 2 View  Result Date: 06/05/2017 CLINICAL DATA:  Shortness of Breath EXAM: CHEST - 2 VIEW COMPARISON:  06/02/2017 FINDINGS: Small left pleural effusion with left base atelectasis. Mild cardiomegaly. Left effusion is decreased since prior study. No focal opacity on the right. No acute bony abnormality. IMPRESSION: Small left pleural effusion and left base atelectasis, improving since prior study. Cardiomegaly. Electronically Signed   By: Rolm Baptise M.D.   On: 06/05/2017 13:51    Cardiac Studies    Right and left heart cath 06/03/17:  Colon Flattery 2nd Diag to 2nd Diag lesion is 25%  stenosed.  Mid RCA lesion is 25% stenosed.  LV end diastolic pressure is moderately elevated.  There is no aortic valve stenosis.  LV end diastolic pressure is normal.  Hemodynamic findings consistent with mild pulmonary hypertension.  CO 5.2 L/min; CI 2.86; Ao 94%; PA sat 65%, mean PA 29 mm Hg; PCWP 20 mm Hg  Mild, nonobstructive CAD.  Mildly elevated right heart pressures with volume overload.  Continue medical therapy. EF not assessed to minimize contrast exposure.   Echo 06/03/17: Study Conclusions - Left ventricle: The cavity size was normal. Wall thickness was increased in a pattern of moderate LVH. Systolic function was moderately reduced. The estimated ejection fraction was in the range of 35% to 40%. Diffuse hypokinesis. Doppler parameters are consistent with restrictive left ventricular relaxation (grade 3 diastolic dysfunction). The E/e&' ratio is >20, suggesting markedly elevated LV filling pressure. - Mitral valve: Mildly thickened leaflets . There was moderate regurgitation. - Left atrium: The atrium was moderately dilated. - Right ventricle: The cavity size was normal. Mildly reduced systolic function. - Tricuspid valve: There was mild regurgitation. - Pulmonary arteries: PA peak pressure: 38 mm Hg (S). - Inferior vena cava: The vessel was dilated. The respirophasic diameter changes were blunted (<50%), consistent with elevated central venous pressure. - Pericardium, extracardiac: A trivial pericardial effusion was identified. There was a left pleural effusion.  Impressions: - Compared to a prior study in 04/2017, the LVEF is lower at 35-40%. There is grade 3 DD with high LV filling pressure. A trivial pericardial effusion and left pleural effusion is noted.   Patient Profile     77 y.o. female with a hx of HTN, tobacco use, NICM, chronic combined systolic and diastolic heart failure with an EF of 40%(initally 20-25% in  2008>recovered by 2011>>now back to 40% as of 05/2017 with OP w/u originally planned),hypertensive heart disease and diverticulosis admitted with HF and elevated troponin.  Assessment & Plan    1. Acute on chronic systolic and diastolic heart failure due to NICM - cath as above with nonobstructive CAD. Appears euvolemic. Continue Lasix as ordered. BMET stable. Would recommend DC with KCl 3meq daily (yesterday required 35meq BID to get K to 4.2); will need BMET at time of f/u appointment per provider at that visit. Will review overnight temp elevation/persistent sinus tach with MD. Rx CHF book.  2. Sinus tachycardia - Dr. Sallyanne Kuster has felt this might be due to vasodilators (Imdur/hydralazine) rather than hypovolemia. Carvedilol titrated.Will review with MD.  3. Acute kidney injury vs CKD stage III - no Cr on file since 2010 to compare to, but has remained variable this admission with 1.1->1.4-1->1.32->1.22  4. Hypokalemia -  improved. Add home potassium. Recommend BMET as OP.  5. Elevated troponin - due to demand ischemia with findings on cath as above.  TOC f/u arranged 3/21 at 1:20pm with Dr. Bronson Ing.  For questions or updates, please contact [place] Please consult [email or web address] for contact info under Cardiology/STEMI.  Signed, Charlie Pitter, PA-C 06/07/2017, 7:37 AM     I have seen and examined the patient along with Charlie Pitter, PA-C.  I have reviewed the chart, notes and new data.  I agree with PA's note.  Key new complaints: She feels great and is eager to go home.  Able to lie almost flat in bed without difficulty. Key examination changes: Mild resting tachycardia, mild jugular venous distention, no edema Key new findings / data: Transient worsening of renal function continues to improve  PLAN: I believe she is still hypervolemic, but to avoid recurrent problems with renal dysfunction would recommend gradual diuresis with oral diuretics as an outpatient.  Will need  early follow-up.  Discussed sodium restriction and daily weight monitoring, signs and symptoms of heart failure exacerbation in detail.  Carvedilol dose was just increased and I would not titrated further in less than 2 weeks.  Sanda Klein, MD, [place] [phone] 06/07/2017, 10:14 AM

## 2017-06-07 NOTE — Progress Notes (Signed)
Pt ambulated about 240 feet in the hall independently without oxygen. Activity was tolerated well except the HR went up to 120s - 130s. It was only a few minutes after coreg was given.  When the Pt got back to bed, their O2 sats was at 95% on room air. Will continue to monitor.  Lupita Dawn, RN

## 2017-06-07 NOTE — Discharge Instructions (Signed)
No driving for 2 days. No lifting over 5 lbs for 1 week. No sexual activity for 1 week. Keep procedure site clean & dry. If you notice increased pain, swelling, bleeding or pus, call/return!  You may shower, but no soaking baths/hot tubs/pools for 1 week.   

## 2017-06-07 NOTE — Progress Notes (Signed)
D/c instructions given to patient and spouse. Review the Living with Heart Failure packet at length. Iv removed, clean and intact. Telemetry removed. Husband to take patient home.  Clyde Canterbury, RN

## 2017-06-07 NOTE — Discharge Summary (Signed)
Physician Discharge Summary  Brandy [name] WUJ:[id number] DOB: 1940/10/11 DOA: 06/02/2017  PCP: [name], MD  Admit date: 06/02/2017 Discharge date: 06/07/2017  Time spent: 35 minutes  Recommendations for Outpatient Follow-up:  1. Note medication changes of Coreg (increased dose), replacement of losartan with BiDil and initiation of Lasix daily 2. Get be met in 1 week 3. Needs transitional care visit with cardiology 2-3 weeks will cc primary cardiologist 4. Will need refills on meds once seen by cardiology  Discharge Diagnoses:  Principal Problem:   Dyspnea Active Problems:   HTN (hypertension)   Pneumonia   Pleural effusion   Discharge Condition: Improved  Diet recommendation: Heart healthy  Filed Weights   06/05/17 0533 06/06/17 0240 06/07/17 0619  Weight: 75.5 kg (166 lb 8 oz) 75.2 kg (165 lb 12.6 oz) 75.4 kg (166 lb 4.8 oz)    History of present illness:  77 year old independent female Combined systolic diastolic heart failure HTN Tobacco abuse Supposed to have stress test 05/29/2017 admitted on3/09/2017 with shortness of breath Cardiology consulted  Hospital Course:  Dyspnea-empirically started on antibiotics which were discontinued, cardiac cath showed nonobstructive CAD placed on Lasix and chest x-ray is improved indicating possible heart failure component  Acute decompensated systolic diastolic failure-Echo 12/02/6211 EF 40% BNP 800 initially was placed on IV twice daily 40 mg Lasix and this was adjusted during hospital stay-she was not taking Lasix 20 mg at home and should be monitored as an outpatient regarding the same-cardiac cath was done as above which did not show any further issues  Elevated troponin-see above-Felt to be demand ischemia  Tobacco abuse-recommended to stop the cigarettes  Stage II chronic kidney disease On admission creatinine 1.18 and stabilized  Hypokalemia replaced Hyperlipidemia continue statin Sinus tachycardia-Coreg was increased  during hospital stay   Procedures:  Cath 06/03/17 Conclusion    Ost 2nd Diag to 2nd Diag lesion is 25% stenosed. Mid RCA lesion is 25% stenosed. LV end diastolic pressure is moderately elevated. There is no aortic valve stenosis. LV end diastolic pressure is normal. Hemodynamic findings consistent with mild pulmonary hypertension. CO 5.2 L/min; CI 2.86; Ao 94%; PA sat 65%, mean PA 29 mm Hg; PCWP 20 mm Hg   Mild, nonobstructive CAD.   Mildly elevated right heart pressures with volume overload.   Continue medical therapy.  EF not assessed to minimize contrast exposure.     Consultations:  Cardiology  Discharge Exam: Vitals:   06/07/17 0015 06/07/17 0400  BP: 116/75 (!) 103/59  Pulse: 93 94  Resp: 18 14  Temp: 100.2 F (37.9 C) 99.4 F (37.4 C)  SpO2: 97% 92%    General: Awake alert seated no distress talking comfortably in full sentences Cardiovascular:  S1-S2 no murmur slight JVD No bruit no lower extremity Respiratory:  Clinically clear no added sound  Discharge Instructions   Discharge Instructions    Diet - low sodium heart healthy   Complete by:  As directed    Discharge instructions   Complete by:  As directed    Look at your medications carefully as they have changed-You will need to follow up with your Cardiologist in 1-2 weeks and they should call you with an appt   Increase activity slowly   Complete by:  As directed      Allergies as of 06/07/2017      Reactions   Lisinopril Swelling      Medication List    STOP taking these medications   ALEVE PO   losartan  25 MG tablet Commonly known as:  COZAAR   naproxen sodium 220 MG tablet Commonly known as:  ALEVE     TAKE these medications   carvedilol 12.5 MG tablet Commonly known as:  COREG Take 1 tablet (12.5 mg total) by mouth 2 (two) times daily with a meal. What changed:    medication strength  how much to take   cetirizine 10 MG tablet Commonly known as:  ZYRTEC Take 10  mg by mouth daily as needed for allergies.   furosemide 40 MG tablet Commonly known as:  LASIX Take 1 tablet (40 mg total) by mouth daily. What changed:    medication strength  how much to take   isosorbide-hydrALAZINE 20-37.5 MG tablet Commonly known as:  BIDIL Take 1 tablet by mouth 3 (three) times daily.      Allergies  Allergen Reactions  . Lisinopril Swelling      The results of significant diagnostics from this hospitalization (including imaging, microbiology, ancillary and laboratory) are listed below for reference.    Significant Diagnostic Studies: Dg Chest 2 View  Result Date: 06/05/2017 CLINICAL DATA:  Shortness of Breath EXAM: CHEST - 2 VIEW COMPARISON:  06/02/2017 FINDINGS: Small left pleural effusion with left base atelectasis. Mild cardiomegaly. Left effusion is decreased since prior study. No focal opacity on the right. No acute bony abnormality. IMPRESSION: Small left pleural effusion and left base atelectasis, improving since prior study. Cardiomegaly. Electronically Signed   By: Rolm Baptise M.D.   On: 06/05/2017 13:51   Dg Chest 2 View  Result Date: 06/02/2017 CLINICAL DATA:  Patient with shortness of breath. EXAM: CHEST - 2 VIEW COMPARISON:  Chest radiograph 08/15/2010. FINDINGS: Anterior cervical spinal fusion hardware. Stable cardiomegaly. Moderate left and small right pleural effusions with underlying consolidation. Thoracic spine degenerative changes. IMPRESSION: Moderate left and small right pleural effusions with underlying consolidation which may represent atelectasis or infection. Electronically Signed   By: Lovey Newcomer M.D.   On: 06/02/2017 16:30   Ct Angio Chest Pe W Or Wo Contrast  Result Date: 06/03/2017 CLINICAL DATA:  Shortness of breath, elevated D-dimer EXAM: CT ANGIOGRAPHY CHEST WITH CONTRAST TECHNIQUE: Multidetector CT imaging of the chest was performed using the standard protocol during bolus administration of intravenous contrast.  Multiplanar CT image reconstructions and MIPs were obtained to evaluate the vascular anatomy. CONTRAST:  150m ISOVUE-370 IOPAMIDOL (ISOVUE-370) INJECTION 76% COMPARISON:  None. FINDINGS: Cardiovascular: Satisfactory opacification of the pulmonary arteries to the segmental level. No evidence of pulmonary embolism. Normal heart size. No pericardial effusion. Normal caliber thoracic aorta. Reflux of contrast into the hepatic veins. Mediastinum/Nodes: No enlarged mediastinal, hilar, or axillary lymph nodes. Thyroid gland, trachea, and esophagus demonstrate no significant findings. Lungs/Pleura: Small bilateral pleural effusions. Bibasilar atelectasis. Mild bilateral interstitial prominence and bronchial wall thickening. No pneumothorax. Upper Abdomen: No acute upper abdominal abnormality. Musculoskeletal: No acute osseous abnormality. No aggressive osseous lesion. Levoscoliosis of the thoracic spine. Review of the MIP images confirms the above findings. IMPRESSION: 1. No evidence of pulmonary embolus. 2. Mild pulmonary edema. Electronically Signed   By: HKathreen Devoid  On: 06/03/2017 14:55    Microbiology: Recent Results (from the past 240 hour(s))  Culture, blood (routine x 2) Call MD if unable to obtain prior to antibiotics being given     Status: None (Preliminary result)   Collection Time: 06/02/17  9:00 PM  Result Value Ref Range Status   Specimen Description BLOOD RIGHT ANTECUBITAL  Final   Special Requests IN  PEDIATRIC BOTTLE Blood Culture adequate volume  Final   Culture   Final    NO GROWTH 4 DAYS Performed at [facility], [place] [address] [place], [place] [address]    Report Status PENDING  Incomplete  Culture, blood (routine x 2) Call MD if unable to obtain prior to antibiotics being given     Status: None (Preliminary result)   Collection Time: 06/02/17  9:05 PM  Result Value Ref Range Status   Specimen Description BLOOD RIGHT ANTECUBITAL  Final   Special Requests IN PEDIATRIC BOTTLE  Blood Culture adequate volume  Final   Culture   Final    NO GROWTH 4 DAYS Performed at [facility], [place] [address], [place], [place] [address]    Report Status PENDING  Incomplete     Labs: Basic Metabolic Panel: Recent Labs  Lab 06/02/17 1513 06/04/17 0232 06/05/17 0348 06/06/17 0246 06/07/17 0223  NA 142 140 141 142 140  K 4.8 3.4* 3.2* 3.3* 4.2  CL 107 102 102 103 104  CO2 _0 GLUCOSE 89 108* 97 108* 99  BUN _1 CREATININE 1.18* 1.10* 1.40* 1.32* 1.22*  CALCIUM 9.2 8.5* 8.8* 8.8* 8.8*  MG  --   --   --  1.7  --    Liver Function Tests: No results for input(s): AST, ALT, ALKPHOS, BILITOT, PROT, ALBUMIN in the last 168 hours. No results for input(s): LIPASE, AMYLASE in the last 168 hours. No results for input(s): AMMONIA in the last 168 hours. CBC: Recent Labs  Lab 06/02/17 1513 06/03/17 0419 06/04/17 0232 06/05/17 0348  WBC 7.6 6.1 6.1 8.3  HGB 12.2 11.8* 11.2* 11.3*  HCT 38.4 36.9 35.1* 35.0*  MCV 91.4 91.3 90.7 90.4  PLT 201 209 219 242   Cardiac Enzymes: Recent Labs  Lab 06/02/17 1719 06/02/17 2110  TROPONINI 0.17* 0.17*   BNP: BNP (last 3 results) Recent Labs    06/02/17 1514  BNP 806.0*    ProBNP (last 3 results) No results for input(s): PROBNP in the last 8760 hours.  CBG: No results for input(s): GLUCAP in the last 168 hours.     Signed:  Nita Sells MD   [facility] 06/07/2017, 8:36 AM

## 2017-06-07 NOTE — Care Management Note (Signed)
Case Management Note Marvetta Gibbons RN, BSN Unit 4E-Case Manager [phone]  Patient Details  Name: Brandy [name] MRN: [id number] Date of Birth: 29-Oct-1940  Subjective/Objective:   Pt admitted with CHF                 Action/Plan: PTA pt lived at home with spouse- plan to return home with f/u as outpt. No CM needs noted for transition to home.   Expected Discharge Date:  06/07/17               Expected Discharge Plan:  Home/Self Care  In-House Referral:  NA  Discharge planning Services  CM Consult  Post Acute Care Choice:  NA Choice offered to:  NA  DME Arranged:    DME Agency:     HH Arranged:    HH Agency:     Status of Service:  Completed, signed off  If discussed at [facility] of Stay Meetings, dates discussed:    Discharge Disposition: home/self care  Additional Comments:  Dawayne Patricia, RN 06/07/2017, 10:55 AM

## 2017-06-08 ENCOUNTER — Encounter (HOSPITAL_COMMUNITY): Payer: Medicare Other

## 2017-06-08 ENCOUNTER — Inpatient Hospital Stay (HOSPITAL_COMMUNITY): Payer: Medicare Other

## 2017-06-08 MED FILL — Heparin Sodium (Porcine) 2 Unit/ML in Sodium Chloride 0.9%: INTRAMUSCULAR | Qty: 1000 | Status: AC

## 2017-06-09 ENCOUNTER — Telehealth: Payer: Self-pay | Admitting: *Deleted

## 2017-06-09 NOTE — Telephone Encounter (Signed)
Patient contacted regarding discharge from [facility] on 06/07/17.  Patient understands to follow up with provider Dr. Bronson Ing on 06/16/17 at [place] at [place]. Patient understands discharge instructions? yes Patient understands medications and regiment? yes Patient understands to bring all medications to this visit? yes

## 2017-06-16 ENCOUNTER — Ambulatory Visit: Payer: Medicare Other | Admitting: Cardiovascular Disease

## 2017-06-16 ENCOUNTER — Encounter: Payer: Self-pay | Admitting: Cardiovascular Disease

## 2017-06-16 VITALS — BP 118/68 | HR 98 | Ht 65.0 in | Wt 169.0 lb

## 2017-06-16 DIAGNOSIS — I110 Hypertensive heart disease with heart failure: Secondary | ICD-10-CM | POA: Diagnosis not present

## 2017-06-16 DIAGNOSIS — N183 Chronic kidney disease, stage 3 unspecified: Secondary | ICD-10-CM

## 2017-06-16 DIAGNOSIS — I5042 Chronic combined systolic (congestive) and diastolic (congestive) heart failure: Secondary | ICD-10-CM | POA: Diagnosis not present

## 2017-06-16 DIAGNOSIS — I10 Essential (primary) hypertension: Secondary | ICD-10-CM

## 2017-06-16 DIAGNOSIS — I428 Other cardiomyopathies: Secondary | ICD-10-CM

## 2017-06-16 DIAGNOSIS — R000 Tachycardia, unspecified: Secondary | ICD-10-CM

## 2017-06-16 LAB — BASIC METABOLIC PANEL
BUN/Creatinine Ratio: 21 (calc) (ref 6–22)
BUN: 28 mg/dL — AB (ref 7–25)
CO2: 34 mmol/L — ABNORMAL HIGH (ref 20–32)
CREATININE: 1.36 mg/dL — AB (ref 0.60–0.93)
Calcium: 9.6 mg/dL (ref 8.6–10.4)
Chloride: 105 mmol/L (ref 98–110)
Glucose, Bld: 97 mg/dL (ref 65–139)
Potassium: 4.6 mmol/L (ref 3.5–5.3)
SODIUM: 144 mmol/L (ref 135–146)

## 2017-06-16 NOTE — Patient Instructions (Signed)
Medication Instructions:  Your physician recommends that you continue on your current medications as directed. Please refer to the Current Medication list given to you today.   Labwork: Your physician recommends that you return for lab work Today    Testing/Procedures: NONE   Follow-Up: Your physician recommends that you schedule a follow-up appointment in: 2 Months    Any Other Special Instructions Will Be Listed Below (If Applicable).     If you need a refill on your cardiac medications before your next appointment, please call your pharmacy.  Thank you for choosing [facility]!

## 2017-06-16 NOTE — Progress Notes (Signed)
SUBJECTIVE: The patient presents for a transition of care appointment after being hospitalized for acute on chronic combined systolic and diastolic heart failure due to a nonischemic cardiomyopathy.  She also developed acute on chronic kidney injury with baseline stage III chronic kidney disease.    Echocardiogram on 06/03/17 demonstrated moderately reduced left ventricular systolic function, LVEF 66-06%, grade 3 diastolic dysfunction with markedly elevated left ventricular filling pressures, moderate mitral regurgitation, and mildly reduced right ventricular systolic function.  Coronary angiography on 06/03/17 demonstrated mild nonobstructive disease with mildly elevated right heart pressures with volume overload.  There is a mid RCA 25% stenosis and a 25% stenosis of the ostium of the second diagonal branch.  She is doing well and feels like she is gradually regaining her strength and energy each day.  Today is her first day outside of her house since being hospitalized.  Her appetite has improved and she has put on about a pound and a half according to her home scales.  I reviewed labs dated 06/07/17: Sodium 140, potassium 4.2, BUN 12, creatinine 1.22.  During our visit, I spoke with her brother, Randel Pigg, who is a recently retired Doctor, hospital in [place], [place].  Soc Hx: Married. 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 Her brother is a Doctor, hospital in [place]). 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  Her husband did his masters in Cabin crew at the [employer] and worked as a principal in [place].   Review of Systems: As per "subjective", otherwise negative.  Allergies  Allergen Reactions  . Lisinopril Swelling    Current Outpatient Medications  Medication Sig Dispense Refill  . carvedilol (COREG) 12.5 MG tablet Take 1 tablet (12.5 mg total) by mouth 2 (two) times daily with  a meal. 60 tablet 0  . cetirizine (ZYRTEC) 10 MG tablet Take 10 mg by mouth daily as needed for allergies.     . furosemide (LASIX) 40 MG tablet Take 1 tablet (40 mg total) by mouth daily. 30 tablet 0  . isosorbide-hydrALAZINE (BIDIL) 20-37.5 MG tablet Take 1 tablet by mouth 3 (three) times daily. 90 tablet 0  . potassium chloride SA (K-DUR,KLOR-CON) 20 MEQ tablet Take 2 tablets (40 mEq total) by mouth daily. 60 tablet 3   No current facility-administered medications for this visit.     Past Medical History:  Diagnosis Date  . Cardiomyopathy    with  a history of congestive heart failure and an EF of 25% in 5/08; nl EF  on the echo in 2011  . Chronic combined systolic and diastolic CHF (congestive heart failure) ([place])   . Colon polyps   . Diverticulosis of colon   . Hypertension   . Hypertensive heart disease   . Osteopenia   . Tobacco abuse    50 pack year    Past Surgical History:  Procedure Laterality Date  . BREAST BIOPSY    . CARPAL TUNNEL RELEASE     right hand  . CERVICAL FUSION    . CESAREAN SECTION     2 times  . COLONOSCOPY  3016   pt uncertain as to whether polypectomy required or performed  . ECTOPIC PREGNANCY SURGERY    . RIGHT/LEFT HEART CATH AND CORONARY ANGIOGRAPHY N/A 06/03/2017   Procedure: RIGHT/LEFT HEART CATH AND CORONARY ANGIOGRAPHY;  Surgeon: Jettie Booze, MD;  Location: [facility];  Service: Cardiovascular;  Laterality: N/A;  .  TONSILLECTOMY      Social History   Socioeconomic History  . Marital status: Married    Spouse name: Not on file  . Number of children: Not on file  . Years of education: Not on file  . Highest education level: Not on file  Occupational History  . Occupation: Former Product manager: RETIRED  Social Needs  . Financial resource strain: Not on file  . Food insecurity:    Worry: Not on file    Inability: Not on file  . Transportation needs:    Medical: Not on file    Non-medical: Not on file    Tobacco Use  . Smoking status: Former Smoker    Packs/day: 1.00    Years: 50.00    Pack years: 50.00    Types: Cigarettes    Start date: 09/02/1960    Last attempt to quit: 03/29/2008    Years since quitting: 9.2  . Smokeless tobacco: Never Used  Substance and Sexual Activity  . Alcohol use: No    Alcohol/week: 0.0 oz  . Drug use: Never  . Sexual activity: Not Currently  Lifestyle  . Physical activity:    Days per week: Not on file    Minutes per session: Not on file  . Stress: Not on file  Relationships  . Social connections:    Talks on phone: Not on file    Gets together: Not on file    Attends religious service: Not on file    Active member of club or organization: Not on file    Attends meetings of clubs or organizations: Not on file    Relationship status: Not on file  . Intimate partner violence:    Fear of current or ex partner: Not on file    Emotionally abused: Not on file    Physically abused: Not on file    Forced sexual activity: Not on file  Other Topics Concern  . Not on file  Social History Narrative   Married with 2 adult children     Vitals:   06/16/17 0843  BP: 118/68  Pulse: 98  SpO2: 98%  Weight: 169 lb (76.7 kg)  Height: 5\' 5"  (1.651 m)    Wt Readings from Last 3 Encounters:  06/16/17 169 lb (76.7 kg)  06/07/17 166 lb 4.8 oz (75.4 kg)  05/04/17 172 lb 3.2 oz (78.1 kg)     PHYSICAL EXAM General: NAD HEENT: Normal. Neck: No JVD, no thyromegaly. Lungs: Clear to auscultation bilaterally with normal respiratory effort. CV: Regular rate and rhythm, normal S1/S2, no S3/S4, no murmur. No pretibial or periankle edema.  No carotid bruit.   Abdomen: Soft, nontender, no distention.  Neurologic: Alert and oriented.  Psych: Normal affect. Skin: Normal. Musculoskeletal: No gross deformities.    ECG: Most recent ECG reviewed.   Labs: Lab Results  Component Value Date/Time   K 4.2 06/07/2017 02:23 AM   BUN 12 06/07/2017 02:23 AM    CREATININE 1.22 (H) 06/07/2017 02:23 AM   CREATININE 1.18 (H) 05/30/2017 01:46 PM   ALT 11 10/23/2008 09:07 PM   TSH 0.910 06/05/2017 02:33 PM   HGB 11.3 (L) 06/05/2017 03:48 AM     Lipids: Lab Results  Component Value Date/Time   LDLCALC 94 06/03/2017 04:19 AM   CHOL 140 06/03/2017 04:19 AM   TRIG 71 06/03/2017 04:19 AM   HDL 32 (L) 06/03/2017 04:19 AM       ASSESSMENT AND PLAN: 1.  Chronic  combined systolic and diastolic heart failure with restrictive diastolic physiology due to a nonischemic cardiomyopathy/hypertensive heart disease: Symptomatically stable.  Continue carvedilol 12.5 mg twice daily along with BiDil 20-37.5 mg 3 times daily and Lasix 40 mg daily.  She takes supplemental potassium 40 mill equivalents daily.  I will check a basic metabolic panel.  And instructed her to weigh herself daily and should she gain 3 pounds in 24 hours or 5 pounds in a week, to take an extra 40 mg of Lasix and 2 double her potassium dose.  2.  Sinus tachycardia: Heart rate is controlled on carvedilol.  No changes.  3.  Chronic kidney disease stage III: I will check a basic metabolic panel.  4.  Chronic hypertension: Blood pressure is controlled.  No changes to therapy.    Disposition: Follow up 2 months.   Kate Sable, M.D., F.A.C.C.

## 2017-06-21 ENCOUNTER — Telehealth: Payer: Self-pay | Admitting: *Deleted

## 2017-06-21 DIAGNOSIS — I10 Essential (primary) hypertension: Secondary | ICD-10-CM

## 2017-06-21 NOTE — Telephone Encounter (Signed)
-----   Message from Herminio Commons, MD sent at 06/20/2017  3:58 PM EDT ----- BUN and creatinine mildly elevated, indicative of cardiorenal syndrome. Please have her try and alternate Lasix 40 mg and 20 mg on alternate days and repeat BMET in 2 weeks (if she is now taking 40 mg daily).

## 2017-06-21 NOTE — Telephone Encounter (Signed)
Pt aware and voiced understanding - will mail pt lab orders - updated medication list - routed to pcp

## 2017-06-26 ENCOUNTER — Other Ambulatory Visit: Payer: Self-pay | Admitting: Physician Assistant

## 2017-07-11 LAB — BASIC METABOLIC PANEL
BUN/Creatinine Ratio: 15 (calc) (ref 6–22)
BUN: 22 mg/dL (ref 7–25)
CALCIUM: 9.3 mg/dL (ref 8.6–10.4)
CO2: 34 mmol/L — AB (ref 20–32)
Chloride: 106 mmol/L (ref 98–110)
Creat: 1.51 mg/dL — ABNORMAL HIGH (ref 0.60–0.93)
GLUCOSE: 79 mg/dL (ref 65–139)
POTASSIUM: 4 mmol/L (ref 3.5–5.3)
Sodium: 146 mmol/L (ref 135–146)

## 2017-07-12 ENCOUNTER — Other Ambulatory Visit: Payer: Self-pay

## 2017-07-12 ENCOUNTER — Encounter (HOSPITAL_COMMUNITY): Payer: Self-pay | Admitting: Emergency Medicine

## 2017-07-12 ENCOUNTER — Observation Stay (HOSPITAL_COMMUNITY)
Admission: EM | Admit: 2017-07-12 | Discharge: 2017-07-14 | Disposition: A | Discharge status: Home / Self Care | Payer: Medicare Other | Attending: Internal Medicine | Admitting: Internal Medicine

## 2017-07-12 ENCOUNTER — Emergency Department (HOSPITAL_COMMUNITY): Payer: Medicare Other

## 2017-07-12 DIAGNOSIS — D649 Anemia, unspecified: Secondary | ICD-10-CM | POA: Diagnosis present

## 2017-07-12 DIAGNOSIS — R4701 Aphasia: Secondary | ICD-10-CM | POA: Diagnosis present

## 2017-07-12 DIAGNOSIS — I110 Hypertensive heart disease with heart failure: Secondary | ICD-10-CM | POA: Insufficient documentation

## 2017-07-12 DIAGNOSIS — R471 Dysarthria and anarthria: Secondary | ICD-10-CM | POA: Diagnosis not present

## 2017-07-12 DIAGNOSIS — G459 Transient cerebral ischemic attack, unspecified: Principal | ICD-10-CM | POA: Diagnosis present

## 2017-07-12 DIAGNOSIS — J302 Other seasonal allergic rhinitis: Secondary | ICD-10-CM | POA: Diagnosis present

## 2017-07-12 DIAGNOSIS — I10 Essential (primary) hypertension: Secondary | ICD-10-CM | POA: Diagnosis present

## 2017-07-12 DIAGNOSIS — I639 Cerebral infarction, unspecified: Secondary | ICD-10-CM

## 2017-07-12 DIAGNOSIS — I5042 Chronic combined systolic (congestive) and diastolic (congestive) heart failure: Secondary | ICD-10-CM | POA: Diagnosis present

## 2017-07-12 DIAGNOSIS — J301 Allergic rhinitis due to pollen: Secondary | ICD-10-CM | POA: Insufficient documentation

## 2017-07-12 DIAGNOSIS — R7989 Other specified abnormal findings of blood chemistry: Secondary | ICD-10-CM | POA: Insufficient documentation

## 2017-07-12 DIAGNOSIS — I5023 Acute on chronic systolic (congestive) heart failure: Secondary | ICD-10-CM | POA: Diagnosis present

## 2017-07-12 DIAGNOSIS — R778 Other specified abnormalities of plasma proteins: Secondary | ICD-10-CM | POA: Diagnosis present

## 2017-07-12 DIAGNOSIS — Z87891 Personal history of nicotine dependence: Secondary | ICD-10-CM | POA: Diagnosis not present

## 2017-07-12 LAB — URINALYSIS, ROUTINE W REFLEX MICROSCOPIC
Bilirubin Urine: NEGATIVE
GLUCOSE, UA: NEGATIVE mg/dL
HGB URINE DIPSTICK: NEGATIVE
KETONES UR: NEGATIVE mg/dL
LEUKOCYTES UA: NEGATIVE
Nitrite: NEGATIVE
PH: 7 (ref 5.0–8.0)
PROTEIN: NEGATIVE mg/dL
Specific Gravity, Urine: 1.016 (ref 1.005–1.030)

## 2017-07-12 LAB — CBC
HEMATOCRIT: 36.2 % (ref 36.0–46.0)
Hemoglobin: 11.2 g/dL — ABNORMAL LOW (ref 12.0–15.0)
MCH: 28.6 pg (ref 26.0–34.0)
MCHC: 30.9 g/dL (ref 30.0–36.0)
MCV: 92.3 fL (ref 78.0–100.0)
Platelets: 268 10*3/uL (ref 150–400)
RBC: 3.92 MIL/uL (ref 3.87–5.11)
RDW: 14.1 % (ref 11.5–15.5)
WBC: 7.5 10*3/uL (ref 4.0–10.5)

## 2017-07-12 LAB — COMPREHENSIVE METABOLIC PANEL
ALBUMIN: 3.4 g/dL — AB (ref 3.5–5.0)
ALT: 12 U/L — ABNORMAL LOW (ref 14–54)
AST: 24 U/L (ref 15–41)
Alkaline Phosphatase: 56 U/L (ref 38–126)
Anion gap: 6 (ref 5–15)
BUN: 22 mg/dL — AB (ref 6–20)
CHLORIDE: 107 mmol/L (ref 101–111)
CO2: 29 mmol/L (ref 22–32)
CREATININE: 1.25 mg/dL — AB (ref 0.44–1.00)
Calcium: 9 mg/dL (ref 8.9–10.3)
GFR calc Af Amer: 48 mL/min — ABNORMAL LOW (ref >60)
GFR, EST NON AFRICAN AMERICAN: 41 mL/min — AB (ref >60)
GLUCOSE: 94 mg/dL (ref 65–99)
POTASSIUM: 4.4 mmol/L (ref 3.5–5.1)
SODIUM: 142 mmol/L (ref 135–145)
Total Bilirubin: 0.9 mg/dL (ref 0.3–1.2)
Total Protein: 6.1 g/dL — ABNORMAL LOW (ref 6.5–8.1)

## 2017-07-12 LAB — DIFFERENTIAL
BASOS ABS: 0 10*3/uL (ref 0.0–0.1)
BASOS PCT: 0 %
EOS ABS: 0.1 10*3/uL (ref 0.0–0.7)
Eosinophils Relative: 2 %
Lymphocytes Relative: 32 %
Lymphs Abs: 2.4 10*3/uL (ref 0.7–4.0)
MONOS PCT: 9 %
Monocytes Absolute: 0.7 10*3/uL (ref 0.1–1.0)
NEUTROS ABS: 4.3 10*3/uL (ref 1.7–7.7)
NEUTROS PCT: 57 %

## 2017-07-12 LAB — I-STAT TROPONIN, ED
TROPONIN I, POC: 0.13 ng/mL — AB (ref 0.00–0.08)
Troponin i, poc: 0.11 ng/mL (ref 0.00–0.08)

## 2017-07-12 LAB — PROTIME-INR
INR: 1.07
Prothrombin Time: 13.9 seconds (ref 11.4–15.2)

## 2017-07-12 LAB — RAPID URINE DRUG SCREEN, HOSP PERFORMED
AMPHETAMINES: NOT DETECTED
BARBITURATES: NOT DETECTED
BENZODIAZEPINES: NOT DETECTED
COCAINE: NOT DETECTED
OPIATES: NOT DETECTED
TETRAHYDROCANNABINOL: NOT DETECTED

## 2017-07-12 LAB — ETHANOL

## 2017-07-12 LAB — APTT: APTT: 27 s (ref 24–36)

## 2017-07-12 LAB — BRAIN NATRIURETIC PEPTIDE: B Natriuretic Peptide: 642 pg/mL — ABNORMAL HIGH (ref 0.0–100.0)

## 2017-07-12 MED ORDER — ASPIRIN 81 MG PO CHEW
324.0000 mg | CHEWABLE_TABLET | Freq: Once | ORAL | Status: AC
  Administered 2017-07-12: 324 mg via ORAL
  Filled 2017-07-12: qty 4

## 2017-07-12 MED ORDER — ONDANSETRON HCL 4 MG PO TABS: 4.0000 mg | ORAL_TABLET | Freq: Four times a day (QID) | ORAL | Status: DC | PRN

## 2017-07-12 MED ORDER — ACETAMINOPHEN 160 MG/5ML PO SOLN: 650.0000 mg | ORAL | Status: DC | PRN

## 2017-07-12 MED ORDER — STROKE: EARLY STAGES OF RECOVERY BOOK
Freq: Once | Status: AC
  Administered 2017-07-13: 01:00:00
  Filled 2017-07-12 (×2): qty 1

## 2017-07-12 MED ORDER — LORATADINE 10 MG PO TABS: 10.0000 mg | ORAL_TABLET | Freq: Every day | ORAL | Status: DC | PRN

## 2017-07-12 MED ORDER — ONDANSETRON HCL 4 MG/2ML IJ SOLN: 4.0000 mg | Freq: Four times a day (QID) | INTRAMUSCULAR | Status: DC | PRN

## 2017-07-12 MED ORDER — FUROSEMIDE 40 MG PO TABS
40.0000 mg | ORAL_TABLET | Freq: Once | ORAL | Status: AC
  Administered 2017-07-13: 40 mg via ORAL
  Filled 2017-07-12: qty 1

## 2017-07-12 MED ORDER — ACETAMINOPHEN 650 MG RE SUPP
650.0000 mg | RECTAL | Status: DC | PRN
Start: 2017-07-12 — End: 2017-07-12

## 2017-07-12 MED ORDER — ACETAMINOPHEN 325 MG PO TABS: 650.0000 mg | ORAL_TABLET | ORAL | Status: DC | PRN

## 2017-07-12 MED ORDER — ACETAMINOPHEN 325 MG PO TABS: 650.0000 mg | ORAL_TABLET | Freq: Four times a day (QID) | ORAL | Status: DC | PRN

## 2017-07-12 MED ORDER — ACETAMINOPHEN 650 MG RE SUPP: 650.0000 mg | Freq: Four times a day (QID) | RECTAL | Status: DC | PRN

## 2017-07-12 MED ORDER — ASPIRIN EC 81 MG PO TBEC
81.0000 mg | DELAYED_RELEASE_TABLET | Freq: Every day | ORAL | Status: DC
  Administered 2017-07-13: 81 mg via ORAL
  Filled 2017-07-12: qty 1

## 2017-07-12 MED ORDER — PREDNISONE 10 MG PO TABS: 5.0000 mg | ORAL_TABLET | Freq: Every day | ORAL | Status: DC | PRN

## 2017-07-12 MED ORDER — POTASSIUM CHLORIDE CRYS ER 20 MEQ PO TBCR
40.0000 meq | EXTENDED_RELEASE_TABLET | Freq: Every day | ORAL | Status: DC
  Administered 2017-07-13 – 2017-07-14 (×2): 40 meq via ORAL
  Filled 2017-07-12 (×2): qty 2

## 2017-07-12 MED ORDER — FUROSEMIDE 20 MG PO TABS
20.0000 mg | ORAL_TABLET | ORAL | Status: DC
  Administered 2017-07-13: 20 mg via ORAL
  Filled 2017-07-12 (×2): qty 1

## 2017-07-12 MED ORDER — PNEUMOCOCCAL VAC POLYVALENT 25 MCG/0.5ML IJ INJ
0.5000 mL | INJECTION | INTRAMUSCULAR | Status: DC
  Filled 2017-07-12: qty 0.5

## 2017-07-12 MED ORDER — ENOXAPARIN SODIUM 40 MG/0.4ML ~~LOC~~ SOLN
40.0000 mg | SUBCUTANEOUS | Status: DC
  Administered 2017-07-13 (×2): 40 mg via SUBCUTANEOUS
  Filled 2017-07-12 (×2): qty 0.4

## 2017-07-12 NOTE — ED Notes (Signed)
CRITICAL VALUE ALERT  Critical Value:  Troponin 0.11  Date & Time Notied:  4 16 19  2137  Provider Notified: Notified Dr. Laverta Baltimore   Orders Received/Actions taken: Notified MD

## 2017-07-12 NOTE — H&P (Signed)
History and Physical    Brandy [name] FWY:[id number] DOB: 1941/03/28 DOA: 07/12/2017  PCP: [name], MD   Patient coming from: Home.  I have personally briefly reviewed patient's old medical records in [place]  Chief Complaint: Fall, confusion and slurred speech.  HPI: Brandy [name] is a 77 y.o. female with medical history significant of dilated cardiomyopathy, chronic combined systolic and diastolic congestive heart failure, history of colon polyps and diverticuli, hypertension, hypertensive heart disease, osteopenia, history of tobacco abuse in the past (50-pack-year/quit in 2010) who comes to the ED for evaluation of fall, confusion and slurred speech.  Per patient, she was found to have fallen out of bed this morning, around 0200.  She does not know exactly how she fell.  Her husband helped her back to bed, but when she woke up her family noticed that she was having difficulty speaking (specifically slurring speech and finding words to talk) and confused.  This has improved some during the day, her family members present in the ED room and stated that she is not slurring his speech anymore at this time, although she is still having some difficulty expressing what she wants to say.  The patient denies headache, blurred vision, gait abnormality, focal weakness or numbness.  No fever, no chills, mild rhinorrhea due to allergies, no sore throat, occasionally productive cough from allergies, no recent chest pain, palpitations, diaphoresis, dyspnea, edema, PND, orthopnea or pitting edema of the lower extremities.  Denies abdominal pain, nausea, emesis, diarrhea, constipation, melena or hematochezia.  No dysuria, frequency or hematuria.  No heat or cold intolerance.  No polyphagia, polydipsia or polyuria.  Denies skin pruritus or rashes.  ED Course: Initial vital signs pulse 77, respirations 17, blood pressure 125/80 mmHg and O2 sat 96% on room air.  I ordered aspirin 324 mg in  the ED.  Urine analysis and urine drug screen were normal.  CBC shows a white count of 7.5 with a normal differential, hemoglobin 11.2 g/dL and platelets 268.  PT 13.9 seconds, INR 1.07 and APTT 27 seconds.  CMP shows a sodium 142, potassium 4.4, chloride 107 and CO2 29 mmol/L.  BUN was 22, creatinine 1.25, glucose 94 calcium 9.0 mg/dL.  Total protein was 6.1 and albumin 3.4 g/dL.  ALT was 12 U/L, and the rest of the LFTs are within normal limits.  Alcohol level was less than 10 mg/dL.  Her BNP was 642 pg/mL.  I-STAT troponin was 0.13 at 1755 and 0.11 ng/mL at 2112.  EKG shows was sinus rhythm at 75 bpm with evidence of old inferior and inferior infarcts.  Please see tracing for further detail.  Imaging: A 2 view chest radiograph shows stable cardiomegaly with pulmonary vascular congestion and a decreased small left effusion.  No focal consolidation was seen.  CT brain without contrast showed a foci of arterial vascular calcifications seen in areas of paranasal sinus, mild periventricular small vessel disease, but no acute intracranial pathology was seen.  Please see images and full radiology report for further detail.  Review of Systems: As per HPI otherwise 10 point review of systems negative.   Past Medical History:  Diagnosis Date  . Cardiomyopathy    with  a history of congestive heart failure and an EF of 25% in 5/08; nl EF  on the echo in 2011  . Chronic combined systolic and diastolic CHF (congestive heart failure) ([place])   . Colon polyps   . Diverticulosis of  colon   . Hypertension   . Hypertensive heart disease   . Osteopenia   . Tobacco abuse    50 pack year    Past Surgical History:  Procedure Laterality Date  . BREAST BIOPSY    . CARPAL TUNNEL RELEASE     right hand  . CERVICAL FUSION    . CESAREAN SECTION     2 times  . COLONOSCOPY  7253   pt uncertain as to whether polypectomy required or performed  . ECTOPIC PREGNANCY SURGERY    . RIGHT/LEFT HEART CATH AND CORONARY  ANGIOGRAPHY N/A 06/03/2017   Procedure: RIGHT/LEFT HEART CATH AND CORONARY ANGIOGRAPHY;  Surgeon: Jettie Booze, MD;  Location: [facility];  Service: Cardiovascular;  Laterality: N/A;  . TONSILLECTOMY       reports that she quit smoking about 9 years ago. Her smoking use included cigarettes. She started smoking about 56 years ago. She has a 50.00 pack-year smoking history. She has never used smokeless tobacco. She reports that she does not drink alcohol or use drugs.  Allergies  Allergen Reactions  . Lisinopril Swelling    Family History  Problem Relation Age of Onset  . Hypertension Brother   . Hypertension Maternal Grandmother     Prior to Admission medications   Medication Sig Start Date End Date Taking? Authorizing Provider  carvedilol (COREG) 12.5 MG tablet Take 1 tablet (12.5 mg total) by mouth 2 (two) times daily with a meal. 06/07/17  Yes Nita Sells, MD  cetirizine (ZYRTEC) 10 MG tablet Take 10 mg by mouth daily as needed for allergies.    Yes [provider]  furosemide (LASIX) 40 MG tablet Take 1 tablet (40 mg total) by mouth daily. Patient taking differently: Take by mouth. TAKE 40 MG EVERY OTHER DAY ALTERNATING WITH 20 MG 06/07/17  Yes Samtani, Darylene Price, MD  isosorbide-hydrALAZINE (BIDIL) 20-37.5 MG tablet Take 1 tablet by mouth 3 (three) times daily. 06/07/17  Yes Nita Sells, MD  potassium chloride SA (K-DUR,KLOR-CON) 20 MEQ tablet Take 2 tablets (40 mEq total) by mouth daily. 06/07/17  Yes Dunn, Dayna N, PA-C  predniSONE (DELTASONE) 5 MG tablet Take 5 mg by mouth daily as needed (for swelling and right knee pain).   Yes [provider]    Physical Exam: Vitals:   07/12/17 2000 07/12/17 2100 07/12/17 2130 07/12/17 2230  BP: 133/84 140/77 138/81 134/81  Pulse: 71 72 70 76  Resp: (!) 22 20 19  (!) 21  TempSrc:      SpO2: 95% 96% 94% 97%    Constitutional: NAD, calm, comfortable Eyes: PERRL, lids and conjunctivae  normal ENMT: Mucous membranes are moist. Posterior pharynx clear of any exudate or lesions. Neck: normal, supple, no masses, no thyromegaly Respiratory: Clear to auscultation bilaterally, no wheezing, no crackles. Normal respiratory effort. No accessory muscle use.  Cardiovascular: Regular rate and rhythm, no S3 or S4, no murmurs / rubs / gallops. No lower extremities.  Edema. 2+ pedal pulses. No carotid bruits.  Abdomen: Soft, no tenderness, no masses palpated. No hepatosplenomegaly. Bowel sounds positive.  Musculoskeletal: no clubbing / cyanosis. Good ROM, no contractures. Normal muscle tone.  Skin: no clinically significant rashes, lesions, ulcers on limited dermatological examination. Neurologic: CN 2-12 grossly intact. Sensation intact, DTR normal. Strength 5/5 in all 4.  Normal coordination with nose to finger.  No pronator drift, although at baseline the patient is unable to lift her right arm completely due to chronic shoulder arthritis/pain.  There is  no motor aphasia anymore, but there is some mild expressive form of it.  Gait evaluation was deferred. Psychiatric: Normal judgment and insight. Alert and oriented x 4.  Mildly anxious mood.    Labs on Admission: I have personally reviewed following labs and imaging studies  CBC: Recent Labs  Lab 07/12/17 1759  WBC 7.5  NEUTROABS 4.3  HGB 11.2*  HCT 36.2  MCV 92.3  PLT 144   Basic Metabolic Panel: Recent Labs  Lab 07/11/17 1126 07/12/17 1759  NA 146 142  K 4.0 4.4  CL 106 107  CO2 34* 29  GLUCOSE 79 94  BUN 22 22*  CREATININE 1.51* 1.25*  CALCIUM 9.3 9.0   GFR: CrCl cannot be calculated (Unknown ideal weight.). Liver Function Tests: Recent Labs  Lab 07/12/17 1759  AST 24  ALT 12*  ALKPHOS 56  BILITOT 0.9  PROT 6.1*  ALBUMIN 3.4*   No results for input(s): LIPASE, AMYLASE in the last 168 hours. No results for input(s): AMMONIA in the last 168 hours. Coagulation Profile: Recent Labs  Lab 07/12/17 1759    INR 1.07   Cardiac Enzymes: No results for input(s): CKTOTAL, CKMB, CKMBINDEX, TROPONINI in the last 168 hours. BNP (last 3 results) No results for input(s): PROBNP in the last 8760 hours. HbA1C: No results for input(s): HGBA1C in the last 72 hours. CBG: No results for input(s): GLUCAP in the last 168 hours. Lipid Profile: No results for input(s): CHOL, HDL, LDLCALC, TRIG, CHOLHDL, LDLDIRECT in the last 72 hours. Thyroid Function Tests: No results for input(s): TSH, T4TOTAL, FREET4, T3FREE, THYROIDAB in the last 72 hours. Anemia Panel: No results for input(s): VITAMINB12, FOLATE, FERRITIN, TIBC, IRON, RETICCTPCT in the last 72 hours. Urine analysis:    Component Value Date/Time   COLORURINE YELLOW 07/12/2017 1754   APPEARANCEUR CLEAR 07/12/2017 1754   LABSPEC 1.016 07/12/2017 1754   PHURINE 7.0 07/12/2017 1754   GLUCOSEU NEGATIVE 07/12/2017 1754   HGBUR NEGATIVE 07/12/2017 1754   BILIRUBINUR NEGATIVE 07/12/2017 1754   KETONESUR NEGATIVE 07/12/2017 1754   PROTEINUR NEGATIVE 07/12/2017 1754   NITRITE NEGATIVE 07/12/2017 1754   LEUKOCYTESUR NEGATIVE 07/12/2017 1754    Radiological Exams on Admission: Dg Chest 2 View  Result Date: 07/12/2017 CLINICAL DATA:  77 y/o  F; shortness of breath. EXAM: CHEST - 2 VIEW COMPARISON:  06/05/2017 chest radiograph FINDINGS: Stable cardiomegaly given projection and technique. Aortic atherosclerosis with calcification. Decreased small left pleural effusion with trace residual. Pulmonary vascular congestion. No consolidation. No pneumothorax. Anterior cervical fusion hardware noted. IMPRESSION: Stable cardiomegaly. Pulmonary vascular congestion. Decreased small left effusion. No focal consolidation. Electronically Signed   By: Kristine Garbe M.D.   On: 07/12/2017 18:43   Ct Head Wo Contrast  Result Date: 07/12/2017 CLINICAL DATA:  Confusion and slurred speech after fall EXAM: CT HEAD WITHOUT CONTRAST TECHNIQUE: Contiguous axial images  were obtained from the base of the skull through the vertex without intravenous contrast. COMPARISON:  None. FINDINGS: Brain: The ventricles are normal in size and configuration. There is no appreciable intracranial mass, hemorrhage, extra-axial fluid collection, or midline shift. There is mild small vessel disease in the centra semiovale bilaterally. Elsewhere gray-white compartments appear normal. No evident acute infarct. Vascular: No hyperdense vessel. There is calcification in both carotid siphon and distal vertebral artery regions. Skull: The bony calvarium appears intact. Sinuses/Orbits: There is mucosal thickening in several ethmoid air cells. There is slight mucosal thickening in each anterior sphenoid sinus region. There is also mild mucosal thickening  in the inferior right frontal sinus. Other visualized paranasal sinuses are clear. Visualized orbits appear symmetric bilaterally. Other: Visualized mastoid air cells are clear. IMPRESSION: Mild periventricular small vessel disease. No acute infarct is demonstrable. No mass or hemorrhage appreciable. No extra-axial fluid collections. No midline shift. Foci of arterial vascular calcification noted. Areas of paranasal sinus disease present. Electronically Signed   By: Lowella Grip III M.D.   On: 07/12/2017 18:48   06/03/2017 echocardiogram complete  ------------------------------------------------------------------- LV EF: 35% -   40%  ------------------------------------------------------------------- History:   PMH:  Elevated Troponin.  Dyspnea.  Risk factors: Hypertension.  ------------------------------------------------------------------- Study Conclusions  - Left ventricle: The cavity size was normal. Wall thickness was   increased in a pattern of moderate LVH. Systolic function was   moderately reduced. The estimated ejection fraction was in the   range of 35% to 40%. Diffuse hypokinesis. Doppler parameters are   consistent  with restrictive left ventricular relaxation (grade 3   diastolic dysfunction). The E/e&' ratio is >20, suggesting   markedly elevated LV filling pressure. - Mitral valve: Mildly thickened leaflets . There was moderate   regurgitation. - Left atrium: The atrium was moderately dilated. - Right ventricle: The cavity size was normal. Mildly reduced   systolic function. - Tricuspid valve: There was mild regurgitation. - Pulmonary arteries: PA peak pressure: 38 mm Hg (S). - Inferior vena cava: The vessel was dilated. The respirophasic   diameter changes were blunted (< 50%), consistent with elevated   central venous pressure. - Pericardium, extracardiac: A trivial pericardial effusion was   identified. There was a left pleural effusion.  Impressions:  - Compared to a prior study in 04/2017, the LVEF is lower at 35-40%.   There is grade 3 DD with high LV filling pressure. A trivial   pericardial effusion and left pleural effusion is noted.   06/03/2017  RIGHT/LEFT HEART CATH AND CORONARY ANGIOGRAPHY   Conclusion     Ost 2nd Diag to 2nd Diag lesion is 25% stenosed.  Mid RCA lesion is 25% stenosed.  LV end diastolic pressure is moderately elevated.  There is no aortic valve stenosis.  LV end diastolic pressure is normal.  Hemodynamic findings consistent with mild pulmonary hypertension.  CO 5.2 L/min; CI 2.86; Ao 94%; PA sat 65%, mean PA 29 mm Hg; PCWP 20 mm Hg   Mild, nonobstructive CAD.   Mildly elevated right heart pressures with volume overload.   Continue medical therapy.  EF not assessed to minimize contrast exposure.    EKG: Independently reviewed.  Vent. rate 75 BPM PR interval * ms QRS duration 102 ms QT/QTc 401/448 ms P-R-T axes 66 -62 48 Sinus rhythm Inferior infarct, old Anterior infarct, old  Assessment/Plan Principal Problem:   TIA (transient ischemic attack) Observation/telemetry. Frequent neuro checks. Swallow screen passed in ED.     Permissive hypertension. PT/OT in a.m. Fasting lipids and hemoglobin A1c in the morning. Check echocardiogram in a.m. MR/MRA head and neck. Consult neurology.  Active Problems:   Hypertension Allow permissive hypertension. Hold BiDil and Coreg. Monitor blood pressure, heart rate.  Chronic combined systolic and diastolic CHF (congestive heart failure) (HCC) Continue fluid restriction. Monitor intake and output. Bidil and coronary held to allow permissive hypertension. Continue furosemide 20-40 mg every day at bedtime.    Elevated troponin Likely due to demand ischemia, history of CHF. Continue cardiac telemetry. Trend troponin level. Echocardiogram ordered for a.m.    Anemia Check anemia panel. Monitor hematocrit and hemoglobin.  Seasonal allergies Continue Zyrtec 10 mg p.o. daily or pharmacy formulary equivalent.    DVT prophylaxis: Lovenox SQ. Code Status: Full code. Family Communication:  Disposition Plan: Observation for TIA/CVA workup. Consults called: Routine neurology consult. Admission status: Observation/telemetry.   Reubin Milan MD [facility] Pager [phone].  If 7PM-7AM, please contact night-coverage [email or web address] Password [facility]  07/12/2017, 11:29 PM

## 2017-07-12 NOTE — Consult Note (Signed)
   [facility] Tele[facility]urology Consult Services  Date of Service: 07/12/17  Impression:  Confusion and slurred speech - transient encephalopathy vs L frontotemporal TIA. Recommend admission for stroke workup.   Recommendations:  Tele ASA 325 mg daily  DVT proph - lovenox permissive htn PT/OT/speech bedside swallow eval MRI/A head/neck Inpatient neuro consult.  ---------------------------------------------------------------------  CC: confusion and slurred speech  History of Present Illness:  77 yo woman woke up this am with confusion and slurred speech. No focal weakness/numbness of her limbs. No LOC/convulsion. No trouble with word finding or understanding language. She is improved and is pretty much at her baseline. She takes no blood thinners. No hx of stroke/MI. She has a hx of CHF.   Diagnostic Testing: CT head unremarkable.   Vital Signs:   Vitals:   07/12/17 1930 07/12/17 2000  BP: 138/79 133/84  Pulse: 68 71  Resp: 20 (!) 22  SpO2: 96% 95%     Exam:   RESULT SUMMARY: 0 points NIH Stroke Scale   INPUTS: 1A: Level of consciousness -> 0 = Alert; keenly responsive 1B: Ask month and age -> 0 = Both questions right 1C: 'Blink eyes' & 'squeeze hands' -> 0 = Performs both tasks 2: Horizontal extraocular movements -> 0 = Normal 3: Visual fields -> 0 = No visual loss 4: Facial palsy -> 0 = Normal symmetry 5A: Left arm motor drift -> 0 = No drift for 10 seconds 5B: Right arm motor drift -> 0 = No drift for 10 seconds 6A: Left leg motor drift -> 0 = No drift for 5 seconds 6B: Right leg motor drift -> 0 = No drift for 5 seconds 7: Limb Ataxia -> 0 = No ataxia 8: Sensation -> 0 = Normal; no sensory loss 9: Language/aphasia -> 0 = Normal; no aphasia 10: Dysarthria -> 0 = Normal 11: Extinction/inattention -> 0 = No abnormality  Medical Decision Making:  - Extensive number of diagnosis or management options are considered above.   - Extensive amount of complex  data reviewed.   - High risk of complication and/or morbidity or mortality are associated with differential diagnostic considerations above.  - There may be uncertain outcome and increased probability of prolonged functional impairment or high probability of severe prolonged functional impairment associated with some of these differential diagnosis.   Medical Data Reviewed:  1.Data reviewed include clinical labs, radiology,  Medical Tests;   2.Tests results discussed w/performing or interpreting physician;   3.Obtaining/reviewing old medical records;  4.Obtaining case history from another source;  5.Independent review of image, tracing or specimen.    Patient was informed the Neurology Consult would happen via telehealth (remote video) and consented to receiving care in this manner.

## 2017-07-12 NOTE — ED Provider Notes (Signed)
Emergency Department Provider Note   I have reviewed the triage vital signs and the nursing notes.   HISTORY  Chief Complaint Aphasia (fall)   HPI Brandy [name] is a 77 y.o. female with PMH of CHF, HTN, and osteopenia resents to the emergency department for evaluation of speech disturbance with confusion and generalized weakness.  Patient was found to have fallen out of her bed at 2 AM this morning by her husband.  She was helped back into bed but upon waking the family noticed that the patient was having difficulty speaking and some confusion.  The patient's daughter, who is at bedside, states that she was confusing people's names and intermittently seeming like her speech was abnormal.  Patient was recently admitted to the hospital with dyspnea and ultimately treated for acute systolic/diastolic heart failure.  She is been compliant with her medications since discharge.  Patient denies any chest pain, abdominal pain, dysuria, or unilateral weakness/numbness. Patient is not anticoagulated.    Past Medical History:  Diagnosis Date  . Cardiomyopathy    with  a history of congestive heart failure and an EF of 25% in 5/08; nl EF  on the echo in 2011  . Chronic combined systolic and diastolic CHF (congestive heart failure) ([place])   . Colon polyps   . Diverticulosis of colon   . Hypertension   . Hypertensive heart disease   . Osteopenia   . Tobacco abuse    50 pack year    Patient Active Problem List   Diagnosis Date Noted  . Pneumonia 06/02/2017  . Pleural effusion 06/02/2017  . Acute on chronic systolic congestive heart failure ([place])   . Elevated troponin   . Other fatigue 05/04/2017  . Dyspnea 05/04/2017  . Hypertensive heart disease   . Chronic combined systolic and diastolic CHF (congestive heart failure) ([place])   . Hypertension   . HTN (hypertension) 01/22/2013  . TOBACCO ABUSE 02/18/2008  . CARDIOMYOPATHY 02/18/2008  . DIVERTICULOSIS OF COLON 02/18/2008  . OSTEOPENIA  02/18/2008    Past Surgical History:  Procedure Laterality Date  . BREAST BIOPSY    . CARPAL TUNNEL RELEASE     right hand  . CERVICAL FUSION    . CESAREAN SECTION     2 times  . COLONOSCOPY  1610   pt uncertain as to whether polypectomy required or performed  . ECTOPIC PREGNANCY SURGERY    . RIGHT/LEFT HEART CATH AND CORONARY ANGIOGRAPHY N/A 06/03/2017   Procedure: RIGHT/LEFT HEART CATH AND CORONARY ANGIOGRAPHY;  Surgeon: Jettie Booze, MD;  Location: [facility];  Service: Cardiovascular;  Laterality: N/A;  . TONSILLECTOMY      Current Outpatient Rx  . Order #: [id number] Class: Normal  . Order #: [id number] Class: Historical Med  . Order #: [id number] Class: Normal  . Order #: [id number] Class: Normal  . Order #: [id number] Class: Normal  . Order #: [id number] Class: Historical Med    Allergies Lisinopril  Family History  Problem Relation Age of Onset  . Hypertension Mother     Social History Social History   Tobacco Use  . Smoking status: Former Smoker    Packs/day: 1.00    Years: 50.00    Pack years: 50.00    Types: Cigarettes    Start date: 09/02/1960    Last attempt to quit: 03/29/2008    Years since quitting: 9.2  . Smokeless tobacco: Never Used  Substance Use Topics  . Alcohol use: No    Alcohol/week: 0.0 oz  .  Drug use: Never    Review of Systems  Constitutional: No fever/chills. Positive generalized weakness.  Eyes: No visual changes. ENT: No sore throat. Cardiovascular: Denies chest pain. Respiratory: Denies shortness of breath. Gastrointestinal: No abdominal pain.  No nausea, no vomiting.  No diarrhea.  No constipation. Genitourinary: Negative for dysuria. Musculoskeletal: Negative for back pain. Skin: Negative for rash. Neurological: Negative for headaches, focal weakness or numbness. Positive speech disturbance and confusion.   10-point ROS otherwise negative.  ____________________________________________   PHYSICAL EXAM:  VITAL  SIGNS: ED Triage Vitals  Enc Vitals Group     BP 07/12/17 1754 125/80     Pulse Rate 07/12/17 1754 77     Resp 07/12/17 1754 17     Temp --      Temp Source 07/12/17 1754 Oral     SpO2 07/12/17 1754 96 %     Pain Score 07/12/17 1753 0   Constitutional: Alert and oriented. Well appearing and in no acute distress. Eyes: Conjunctivae are normal. PERRL. EOMI. Head: Atraumatic. Nose: No congestion/rhinnorhea. Mouth/Throat: Mucous membranes are moist.   Neck: No stridor. Cardiovascular: Normal rate, regular rhythm. Good peripheral circulation. Grossly normal heart sounds.   Respiratory: Normal respiratory effort.  No retractions. Lungs CTAB. Gastrointestinal: Soft and nontender. No distention.  Musculoskeletal: No lower extremity tenderness nor edema. No gross deformities of extremities. Neurologic: Patient with dysarthria and occasional inappropriate words. No gross focal neurologic deficits are appreciated. Normal CN exam 2-12. No pronator drift. Normal finger-to-nose testing. No visual field deficit appreciated.  Skin:  Skin is warm, dry and intact. No rash noted.   ____________________________________________   LABS (all labs ordered are listed, but only abnormal results are displayed)  Labs Reviewed  CBC - Abnormal; Notable for the following components:      Result Value   Hemoglobin 11.2 (*)    All other components within normal limits  COMPREHENSIVE METABOLIC PANEL - Abnormal; Notable for the following components:   BUN 22 (*)    Creatinine, Ser 1.25 (*)    Total Protein 6.1 (*)    Albumin 3.4 (*)    ALT 12 (*)    GFR calc non Af Amer 41 (*)    GFR calc Af Amer 48 (*)    All other components within normal limits  BRAIN NATRIURETIC PEPTIDE - Abnormal; Notable for the following components:   B Natriuretic Peptide 642.0 (*)    All other components within normal limits  I-STAT TROPONIN, ED - Abnormal; Notable for the following components:   Troponin i, poc 0.13 (*)     All other components within normal limits  ETHANOL  PROTIME-INR  APTT  DIFFERENTIAL  RAPID URINE DRUG SCREEN, HOSP PERFORMED  URINALYSIS, ROUTINE W REFLEX MICROSCOPIC  I-STAT CHEM 8, ED   ____________________________________________  EKG   EKG Interpretation  Date/Time:  Tuesday July 12 2017 18:03:37 EDT Ventricular Rate:  75 PR Interval:    QRS Duration: 102 QT Interval:  401 QTC Calculation: 448 R Axis:   -62 Text Interpretation:  Sinus rhythm Inferior infarct, old Anterior infarct, old No STEMI.  Confirmed by Nanda Quinton [phone]) on 07/12/2017 6:40:41 PM       ____________________________________________  RADIOLOGY  Dg Chest 2 View  Result Date: 07/12/2017 CLINICAL DATA:  77 y/o  F; shortness of breath. EXAM: CHEST - 2 VIEW COMPARISON:  06/05/2017 chest radiograph FINDINGS: Stable cardiomegaly given projection and technique. Aortic atherosclerosis with calcification. Decreased small left pleural effusion with trace residual. Pulmonary vascular  congestion. No consolidation. No pneumothorax. Anterior cervical fusion hardware noted. IMPRESSION: Stable cardiomegaly. Pulmonary vascular congestion. Decreased small left effusion. No focal consolidation. Electronically Signed   By: Kristine Garbe M.D.   On: 07/12/2017 18:43   Ct Head Wo Contrast  Result Date: 07/12/2017 CLINICAL DATA:  Confusion and slurred speech after fall EXAM: CT HEAD WITHOUT CONTRAST TECHNIQUE: Contiguous axial images were obtained from the base of the skull through the vertex without intravenous contrast. COMPARISON:  None. FINDINGS: Brain: The ventricles are normal in size and configuration. There is no appreciable intracranial mass, hemorrhage, extra-axial fluid collection, or midline shift. There is mild small vessel disease in the centra semiovale bilaterally. Elsewhere gray-white compartments appear normal. No evident acute infarct. Vascular: No hyperdense vessel. There is calcification in both  carotid siphon and distal vertebral artery regions. Skull: The bony calvarium appears intact. Sinuses/Orbits: There is mucosal thickening in several ethmoid air cells. There is slight mucosal thickening in each anterior sphenoid sinus region. There is also mild mucosal thickening in the inferior right frontal sinus. Other visualized paranasal sinuses are clear. Visualized orbits appear symmetric bilaterally. Other: Visualized mastoid air cells are clear. IMPRESSION: Mild periventricular small vessel disease. No acute infarct is demonstrable. No mass or hemorrhage appreciable. No extra-axial fluid collections. No midline shift. Foci of arterial vascular calcification noted. Areas of paranasal sinus disease present. Electronically Signed   By: Lowella Grip III M.D.   On: 07/12/2017 18:48    ____________________________________________   PROCEDURES  Procedure(s) performed:   Procedures  None ____________________________________________   INITIAL IMPRESSION / ASSESSMENT AND PLAN / ED COURSE  Pertinent labs & imaging results that were available during my care of the patient were reviewed by me and considered in my medical decision making (see chart for details).  Patient presents to the emergency department with aphasia, confusion, generalized weakness.  Last seen normal at 10 PM yesterday.  No code stroke but I do have suspicion for possible CVA.  Patient with no focal deficits other than some dysarthria.  Plan for screening labs and CT head.   07:55 PM Patient labs and CT imaging reviewed. No acute findings. Patient with slight troponin elevation similar to March 2019 admission. Not clinically volume overloaded. Plan for trending.   08:20 PM Neurology evaluated the patient remotely. Advises admit for TIA w/u. Trending troponin now and will admit.   Discussed patient's case with Hospitlaist, Dr. Olevia Bowens to request admission. Patient and family (if present) updated with plan. Care  transferred to Hospitalist service.  I reviewed all nursing notes, vitals, pertinent old records, EKGs, labs, imaging (as available).  ____________________________________________  FINAL CLINICAL IMPRESSION(S) / ED DIAGNOSES  Final diagnoses:  TIA (transient ischemic attack)  Dysarthria     MEDICATIONS GIVEN DURING THIS VISIT:  Medications  furosemide (LASIX) tablet 20 mg (20 mg Oral Given 07/13/17 0913)  potassium chloride SA (K-DUR,KLOR-CON) CR tablet 40 mEq (40 mEq Oral Given 07/13/17 0913)  predniSONE (DELTASONE) tablet 5 mg (has no administration in time range)  loratadine (CLARITIN) tablet 10 mg (has no administration in time range)  ondansetron (ZOFRAN) tablet 4 mg (has no administration in time range)    Or  ondansetron (ZOFRAN) injection 4 mg (has no administration in time range)  acetaminophen (TYLENOL) tablet 650 mg (has no administration in time range)    Or  acetaminophen (TYLENOL) suppository 650 mg (has no administration in time range)  aspirin EC tablet 81 mg (81 mg Oral Given 07/13/17 0913)  enoxaparin (LOVENOX) injection  40 mg (40 mg Subcutaneous Given 07/13/17 0111)  furosemide (LASIX) tablet 40 mg (has no administration in time range)  pneumococcal 23 valent vaccine (PNU-IMMUNE) injection 0.5 mL (0.5 mLs Intramuscular Not Given 07/13/17 0913)  aspirin chewable tablet 324 mg (324 mg Oral Given 07/12/17 2156)   stroke: mapping our early stages of recovery book ( Does not apply Given 07/13/17 0110)  gadobenate dimeglumine (MULTIHANCE) injection 16 mL (16 mLs Intravenous Contrast Given 07/13/17 0820)   Note:  This document was prepared using Dragon voice recognition software and may include unintentional dictation errors.  Nanda Quinton, MD Emergency Medicine    Long, [facility], MD 07/13/17 [phone]

## 2017-07-12 NOTE — ED Notes (Signed)
Pt to CT

## 2017-07-12 NOTE — ED Notes (Signed)
CRITICAL VALUE ALERT  Critical Value:  Troponin 0.13  Date & Time Notied:  4 16 19  1813  Provider Notified: Notified Dr. Laverta Baltimore   Orders Received/Actions taken: Notified MD

## 2017-07-12 NOTE — ED Triage Notes (Signed)
Husband states pt fell and hit head last night around 2am but was fine right after. Husband states pt slept all day today. Daughter called her at 5pm and noticed incomprehensible speech, confusion and slurred speech. Pt arrived to ED a/o. Slow to respond to commands. edp in room upon entrance along with tele neuro. Pt able to get from w/c to stretcher without difficulty

## 2017-07-13 ENCOUNTER — Other Ambulatory Visit: Payer: Self-pay

## 2017-07-13 ENCOUNTER — Observation Stay (HOSPITAL_COMMUNITY): Payer: Medicare Other

## 2017-07-13 ENCOUNTER — Observation Stay (HOSPITAL_BASED_OUTPATIENT_CLINIC_OR_DEPARTMENT_OTHER): Payer: Medicare Other

## 2017-07-13 DIAGNOSIS — I6789 Other cerebrovascular disease: Secondary | ICD-10-CM | POA: Diagnosis not present

## 2017-07-13 DIAGNOSIS — I5042 Chronic combined systolic (congestive) and diastolic (congestive) heart failure: Secondary | ICD-10-CM

## 2017-07-13 DIAGNOSIS — I639 Cerebral infarction, unspecified: Secondary | ICD-10-CM

## 2017-07-13 DIAGNOSIS — D519 Vitamin B12 deficiency anemia, unspecified: Secondary | ICD-10-CM | POA: Diagnosis not present

## 2017-07-13 DIAGNOSIS — J302 Other seasonal allergic rhinitis: Secondary | ICD-10-CM

## 2017-07-13 DIAGNOSIS — R748 Abnormal levels of other serum enzymes: Secondary | ICD-10-CM

## 2017-07-13 LAB — COMPREHENSIVE METABOLIC PANEL
ALK PHOS: 52 U/L (ref 38–126)
ALT: 10 U/L — ABNORMAL LOW (ref 14–54)
ANION GAP: 8 (ref 5–15)
AST: 17 U/L (ref 15–41)
Albumin: 3.1 g/dL — ABNORMAL LOW (ref 3.5–5.0)
BUN: 20 mg/dL (ref 6–20)
CALCIUM: 8.5 mg/dL — AB (ref 8.9–10.3)
CO2: 28 mmol/L (ref 22–32)
Chloride: 109 mmol/L (ref 101–111)
Creatinine, Ser: 1.15 mg/dL — ABNORMAL HIGH (ref 0.44–1.00)
GFR calc Af Amer: 53 mL/min — ABNORMAL LOW (ref >60)
GFR calc non Af Amer: 46 mL/min — ABNORMAL LOW (ref >60)
Glucose, Bld: 76 mg/dL (ref 65–99)
POTASSIUM: 3.9 mmol/L (ref 3.5–5.1)
SODIUM: 145 mmol/L (ref 135–145)
Total Bilirubin: 0.8 mg/dL (ref 0.3–1.2)
Total Protein: 5.6 g/dL — ABNORMAL LOW (ref 6.5–8.1)

## 2017-07-13 LAB — RETICULOCYTES
RBC.: 3.66 MIL/uL — ABNORMAL LOW (ref 3.87–5.11)
Retic Count, Absolute: 69.5 10*3/uL (ref 19.0–186.0)
Retic Ct Pct: 1.9 % (ref 0.4–3.1)

## 2017-07-13 LAB — IRON AND TIBC
IRON: 71 ug/dL (ref 28–170)
Saturation Ratios: 27 % (ref 10.4–31.8)
TIBC: 267 ug/dL (ref 250–450)
UIBC: 196 ug/dL

## 2017-07-13 LAB — CBC
HEMATOCRIT: 33.9 % — AB (ref 36.0–46.0)
HEMOGLOBIN: 10.2 g/dL — AB (ref 12.0–15.0)
MCH: 27.9 pg (ref 26.0–34.0)
MCHC: 30.1 g/dL (ref 30.0–36.0)
MCV: 92.6 fL (ref 78.0–100.0)
Platelets: 232 10*3/uL (ref 150–400)
RBC: 3.66 MIL/uL — ABNORMAL LOW (ref 3.87–5.11)
RDW: 13.9 % (ref 11.5–15.5)
WBC: 6.5 10*3/uL (ref 4.0–10.5)

## 2017-07-13 LAB — LIPID PANEL
CHOL/HDL RATIO: 3.3 ratio
Cholesterol: 136 mg/dL (ref 0–200)
HDL: 41 mg/dL (ref >40)
LDL Cholesterol: 85 mg/dL (ref 0–99)
Triglycerides: 52 mg/dL (ref <150)
VLDL: 10 mg/dL (ref 0–40)

## 2017-07-13 LAB — FERRITIN: Ferritin: 61 ng/mL (ref 11–307)

## 2017-07-13 LAB — ECHOCARDIOGRAM LIMITED
HEIGHTINCHES: 64.5 in
WEIGHTICAEL: 2754.87 [oz_av]

## 2017-07-13 LAB — FOLATE: FOLATE: 10.6 ng/mL (ref >5.9)

## 2017-07-13 LAB — HEMOGLOBIN A1C
HEMOGLOBIN A1C: 4.8 % (ref 4.8–5.6)
MEAN PLASMA GLUCOSE: 91.06 mg/dL

## 2017-07-13 LAB — VITAMIN B12: VITAMIN B 12: 164 pg/mL — AB (ref 180–914)

## 2017-07-13 MED ORDER — CYANOCOBALAMIN 1000 MCG/ML IJ SOLN
1000.0000 ug | INTRAMUSCULAR | Status: AC
  Administered 2017-07-13: 1000 ug via INTRAMUSCULAR
  Filled 2017-07-13: qty 1

## 2017-07-13 MED ORDER — GADOBENATE DIMEGLUMINE 529 MG/ML IV SOLN
16.0000 mL | Freq: Once | INTRAVENOUS | Status: AC | PRN
  Administered 2017-07-13: 16 mL via INTRAVENOUS

## 2017-07-13 MED ORDER — ATORVASTATIN CALCIUM 10 MG PO TABS: 10.0000 mg | ORAL_TABLET | Freq: Every day | ORAL | Status: DC

## 2017-07-13 MED ORDER — ASPIRIN EC 81 MG PO TBEC
162.0000 mg | DELAYED_RELEASE_TABLET | Freq: Every day | ORAL | Status: DC
  Administered 2017-07-14: 162 mg via ORAL
  Filled 2017-07-13: qty 2

## 2017-07-13 NOTE — Progress Notes (Signed)
[facility] PROGRESS NOTE  Brandy [name] ION:[id number] DOB: 18-Feb-1941 DOA: 07/12/2017 PCP: [name], MD Interim summary and HPI 77 y.o. female with medical history significant of dilated cardiomyopathy, chronic combined systolic and diastolic congestive heart failure, history of colon polyps and diverticuli, hypertension, hypertensive heart disease, osteopenia, history of tobacco abuse in the past (50-pack-year/quit in 2010) who comes to the ED for evaluation of fall, confusion and slurred speech.  Found to have acute stroke.   Assessment/Plan: 1-Acute ischemic stroke: -patient with improvement in neurologic exam -MRI demonstrating small ischemic stroke -will complete work up and follow neurology rec's -start ASA and statins  -no preventive meds prior to admission  -allowing permissive HTN OT and SPL, recommending outpatient therapy.  2-HTN -will allow permissive HTN -slowly control BP -continue heart healthy diet   3-chronic combined CHF -EF 40% -compensated  -follow daily weights and strict intake and output  -resume home meds at discharge (allowing permissive HTN currently)  4-elevated troponin -mild demand ischemia in the setting of acute stroke  -no CP or SOB -continue monitoring on telemetry   5-anemia: -chronic -no signs of acute bleeding -follow anemia panel results   6-Seasonal rhinitis  -continue loratadine    Code Status: Full Family Communication: husband at bedside  Disposition Plan: most likely home tomorrow after completing work up and having neurology evaluation.   Consultants:  Neurology   Procedures:  See below for x-ray reports   2-D echo  MRI/MRA  Antibiotics:  None   HPI/Subjective: Afebrile, no CP, no SOB. Reported feeling better and in no distress. Patient following commands appropriately, no focal motor deficit appreciated.  Objective: Vitals:   07/13/17 1430 07/13/17 1834  BP: 102/64 140/85  Pulse: 61 79   Resp: 18 18  Temp: 98.4 F (36.9 C) 98.7 F (37.1 C)  SpO2: 95% 96%    Intake/Output Summary (Last 24 hours) at 07/13/2017 1848 Last data filed at 07/13/2017 1800 Gross per 24 hour  Intake 720 ml  Output -  Net 720 ml   Filed Weights   07/12/17 2335 07/13/17 0030  Weight: 78.1 kg (172 lb 2.9 oz) 78.1 kg (172 lb 2.9 oz)    Exam:   General:  In no distress; patient with mild wernicke aphasia; otherwise neurologically and motor intact.  Cardiovascular: S1 and S2, no rubs, no gallops  Respiratory: CTA bilaterally, no using accessory muscles.  Abdomen: soft, NT, ND, positive BS  Musculoskeletal: no edema, no cyanosis, no clubbing  Neurology: CN intact, able to follow 3 steps commands, no pronator drift, mild wernicke aphasia, MS 5/5  Data Reviewed: Basic Metabolic Panel: Recent Labs  Lab 07/11/17 1126 07/12/17 1759 07/13/17 0445  NA 146 142 145  K 4.0 4.4 3.9  CL 106 107 109  CO2 34* 29 28  GLUCOSE 79 94 76  BUN 22 22* 20  CREATININE 1.51* 1.25* 1.15*  CALCIUM 9.3 9.0 8.5*   Liver Function Tests: Recent Labs  Lab 07/12/17 1759 07/13/17 0445  AST 24 17  ALT 12* 10*  ALKPHOS 56 52  BILITOT 0.9 0.8  PROT 6.1* 5.6*  ALBUMIN 3.4* 3.1*   No results for input(s): LIPASE, AMYLASE in the last 168 hours. No results for input(s): AMMONIA in the last 168 hours. CBC: Recent Labs  Lab 07/12/17 1759 07/13/17 0445  WBC 7.5 6.5  NEUTROABS 4.3  --   HGB 11.2* 10.2*  HCT 36.2 33.9*  MCV 92.3 92.6  PLT 268 232   Cardiac Enzymes: No results for  input(s): CKTOTAL, CKMB, CKMBINDEX, TROPONINI in the last 168 hours. BNP (last 3 results) Recent Labs    06/02/17 1514 07/12/17 1759  BNP 806.0* 642.0*    Studies: Dg Chest 2 View  Result Date: 07/12/2017 CLINICAL DATA:  77 y/o  F; shortness of breath. EXAM: CHEST - 2 VIEW COMPARISON:  06/05/2017 chest radiograph FINDINGS: Stable cardiomegaly given projection and technique. Aortic atherosclerosis with  calcification. Decreased small left pleural effusion with trace residual. Pulmonary vascular congestion. No consolidation. No pneumothorax. Anterior cervical fusion hardware noted. IMPRESSION: Stable cardiomegaly. Pulmonary vascular congestion. Decreased small left effusion. No focal consolidation. Electronically Signed   By: Kristine Garbe M.D.   On: 07/12/2017 18:43   Ct Head Wo Contrast  Result Date: 07/12/2017 CLINICAL DATA:  Confusion and slurred speech after fall EXAM: CT HEAD WITHOUT CONTRAST TECHNIQUE: Contiguous axial images were obtained from the base of the skull through the vertex without intravenous contrast. COMPARISON:  None. FINDINGS: Brain: The ventricles are normal in size and configuration. There is no appreciable intracranial mass, hemorrhage, extra-axial fluid collection, or midline shift. There is mild small vessel disease in the centra semiovale bilaterally. Elsewhere gray-white compartments appear normal. No evident acute infarct. Vascular: No hyperdense vessel. There is calcification in both carotid siphon and distal vertebral artery regions. Skull: The bony calvarium appears intact. Sinuses/Orbits: There is mucosal thickening in several ethmoid air cells. There is slight mucosal thickening in each anterior sphenoid sinus region. There is also mild mucosal thickening in the inferior right frontal sinus. Other visualized paranasal sinuses are clear. Visualized orbits appear symmetric bilaterally. Other: Visualized mastoid air cells are clear. IMPRESSION: Mild periventricular small vessel disease. No acute infarct is demonstrable. No mass or hemorrhage appreciable. No extra-axial fluid collections. No midline shift. Foci of arterial vascular calcification noted. Areas of paranasal sinus disease present. Electronically Signed   By: Lowella Grip III M.D.   On: 07/12/2017 18:48   Mr Jodene Nam Neck W Wo Contrast  Result Date: 07/13/2017 CLINICAL DATA:  Weakness and confusion for 1  day EXAM: MR HEAD WITHOUT CONTRAST MR CIRCLE OF WILLIS WITHOUT CONTRAST MRA OF THE NECK WITHOUT AND WITH CONTRAST TECHNIQUE: Multiplanar, multiecho pulse sequences of the brain, circle of willis and surrounding structures were obtained without intravenous contrast. Angiographic images of the neck were obtained using MRA technique without and with intravenous contrast. CONTRAST:  21mL MULTIHANCE GADOBENATE DIMEGLUMINE 529 MG/ML IV SOLN COMPARISON:  06/25/2005 FINDINGS: MR HEAD FINDINGS Brain: Restricted diffusion along the lower left insula and in the subinsular white matter. Weakly restricted diffusion in the low and lateral left frontal cortex. There is mild for age chronic small vessel ischemic type change in the cerebral white matter. Small remote left inferior cerebellar infarct. No hemorrhage, hydrocephalus, or masslike finding. Vascular: Arterial findings below. There is increased FLAIR signal within left M3 and M4 branches attributed the slow flow or occlusion based on below. Normal dural venous sinus flow voids. Skull and upper cervical spine: No evidence of marrow lesion. C3-4 disc degeneration and left-sided facet spurring with disc impinging on the cord. Sinuses/Orbits: Bilateral cataract resection.  No acute finding. MR CIRCLE OF WILLIS FINDINGS Symmetric carotid and vertebral arteries. Proximal left M2 branch occlusion. No more proximal flow limiting stenosis or occlusion seen. Medially and slightly inferiorly directed 3 mm saccular outpouching from the left ICA at the superior hypophyseal level. MRA NECK FINDINGS Time-of-flight shows antegrade flow in both carotid and vertebral arteries. Postcontrast imaging shows normal diameter aortic arch. Vertebral and basilar  arteries are smooth and diffusely patent. Based on the chart it appears the patient is outside of the treatment window and near baseline. A call has been placed to the ordering provider. IMPRESSION: 1. Left M2 occlusion with comparatively  small acute infarct along the lower left insula and external capsule. Small subacute infarct along the lateral left frontal cortex. 2. 3 mm left superior hypophyseal artery region ICA aneurysm. 3. Negative neck MRA.  No stenosis or embolic source seen. 4. Mild chronic small vessel ischemia in the cerebral white matter. Electronically Signed   By: Monte Fantasia M.D.   On: 07/13/2017 09:43   Mr Brain Wo Contrast  Result Date: 07/13/2017 CLINICAL DATA:  Weakness and confusion for 1 day EXAM: MR HEAD WITHOUT CONTRAST MR CIRCLE OF WILLIS WITHOUT CONTRAST MRA OF THE NECK WITHOUT AND WITH CONTRAST TECHNIQUE: Multiplanar, multiecho pulse sequences of the brain, circle of willis and surrounding structures were obtained without intravenous contrast. Angiographic images of the neck were obtained using MRA technique without and with intravenous contrast. CONTRAST:  75mL MULTIHANCE GADOBENATE DIMEGLUMINE 529 MG/ML IV SOLN COMPARISON:  06/25/2005 FINDINGS: MR HEAD FINDINGS Brain: Restricted diffusion along the lower left insula and in the subinsular white matter. Weakly restricted diffusion in the low and lateral left frontal cortex. There is mild for age chronic small vessel ischemic type change in the cerebral white matter. Small remote left inferior cerebellar infarct. No hemorrhage, hydrocephalus, or masslike finding. Vascular: Arterial findings below. There is increased FLAIR signal within left M3 and M4 branches attributed the slow flow or occlusion based on below. Normal dural venous sinus flow voids. Skull and upper cervical spine: No evidence of marrow lesion. C3-4 disc degeneration and left-sided facet spurring with disc impinging on the cord. Sinuses/Orbits: Bilateral cataract resection.  No acute finding. MR CIRCLE OF WILLIS FINDINGS Symmetric carotid and vertebral arteries. Proximal left M2 branch occlusion. No more proximal flow limiting stenosis or occlusion seen. Medially and slightly inferiorly directed 3  mm saccular outpouching from the left ICA at the superior hypophyseal level. MRA NECK FINDINGS Time-of-flight shows antegrade flow in both carotid and vertebral arteries. Postcontrast imaging shows normal diameter aortic arch. Vertebral and basilar arteries are smooth and diffusely patent. Based on the chart it appears the patient is outside of the treatment window and near baseline. A call has been placed to the ordering provider. IMPRESSION: 1. Left M2 occlusion with comparatively small acute infarct along the lower left insula and external capsule. Small subacute infarct along the lateral left frontal cortex. 2. 3 mm left superior hypophyseal artery region ICA aneurysm. 3. Negative neck MRA.  No stenosis or embolic source seen. 4. Mild chronic small vessel ischemia in the cerebral white matter. Electronically Signed   By: Monte Fantasia M.D.   On: 07/13/2017 09:43   Mr Jodene Nam Head/brain VO Cm  Result Date: 07/13/2017 CLINICAL DATA:  Weakness and confusion for 1 day EXAM: MR HEAD WITHOUT CONTRAST MR CIRCLE OF WILLIS WITHOUT CONTRAST MRA OF THE NECK WITHOUT AND WITH CONTRAST TECHNIQUE: Multiplanar, multiecho pulse sequences of the brain, circle of willis and surrounding structures were obtained without intravenous contrast. Angiographic images of the neck were obtained using MRA technique without and with intravenous contrast. CONTRAST:  94mL MULTIHANCE GADOBENATE DIMEGLUMINE 529 MG/ML IV SOLN COMPARISON:  06/25/2005 FINDINGS: MR HEAD FINDINGS Brain: Restricted diffusion along the lower left insula and in the subinsular white matter. Weakly restricted diffusion in the low and lateral left frontal cortex. There is mild for age  chronic small vessel ischemic type change in the cerebral white matter. Small remote left inferior cerebellar infarct. No hemorrhage, hydrocephalus, or masslike finding. Vascular: Arterial findings below. There is increased FLAIR signal within left M3 and M4 branches attributed the slow flow  or occlusion based on below. Normal dural venous sinus flow voids. Skull and upper cervical spine: No evidence of marrow lesion. C3-4 disc degeneration and left-sided facet spurring with disc impinging on the cord. Sinuses/Orbits: Bilateral cataract resection.  No acute finding. MR CIRCLE OF WILLIS FINDINGS Symmetric carotid and vertebral arteries. Proximal left M2 branch occlusion. No more proximal flow limiting stenosis or occlusion seen. Medially and slightly inferiorly directed 3 mm saccular outpouching from the left ICA at the superior hypophyseal level. MRA NECK FINDINGS Time-of-flight shows antegrade flow in both carotid and vertebral arteries. Postcontrast imaging shows normal diameter aortic arch. Vertebral and basilar arteries are smooth and diffusely patent. Based on the chart it appears the patient is outside of the treatment window and near baseline. A call has been placed to the ordering provider. IMPRESSION: 1. Left M2 occlusion with comparatively small acute infarct along the lower left insula and external capsule. Small subacute infarct along the lateral left frontal cortex. 2. 3 mm left superior hypophyseal artery region ICA aneurysm. 3. Negative neck MRA.  No stenosis or embolic source seen. 4. Mild chronic small vessel ischemia in the cerebral white matter. Electronically Signed   By: Monte Fantasia M.D.   On: 07/13/2017 09:43    Scheduled Meds: . aspirin EC  81 mg Oral Daily  . enoxaparin (LOVENOX) injection  40 mg Subcutaneous Q24H  . furosemide  20 mg Oral QODAY  . pneumococcal 23 valent vaccine  0.5 mL Intramuscular Tomorrow-1000  . potassium chloride SA  40 mEq Oral Daily   Continuous Infusions:  Principal Problem:   TIA (transient ischemic attack) Active Problems:   Chronic combined systolic and diastolic CHF (congestive heart failure) (HCC)   Hypertension   Elevated troponin   Anemia   Seasonal allergies    Time spent:30 minutes    [place]  [facility] Pager [phone]. If 7PM-7AM, please contact night-coverage at [email or web address], password [email or web address] 07/13/2017, 6:48 PM  LOS: 0 days

## 2017-07-13 NOTE — Evaluation (Signed)
Speech Language Pathology Evaluation Patient Details Name: Brandy [name] MRN: [id number] DOB: Mar 09, 1941 Today's Date: 07/13/2017 Time: 1130-1200 SLP Time Calculation (min) (ACUTE ONLY): 30 min  Problem List:  Patient Active Problem List   Diagnosis Date Noted  . Seasonal allergies 07/13/2017  . TIA (transient ischemic attack) 07/12/2017  . Anemia 07/12/2017  . Pneumonia 06/02/2017  . Pleural effusion 06/02/2017  . Acute on chronic systolic congestive heart failure ([place])   . Elevated troponin   . Other fatigue 05/04/2017  . Dyspnea 05/04/2017  . Hypertensive heart disease   . Chronic combined systolic and diastolic CHF (congestive heart failure) ([place])   . Hypertension   . HTN (hypertension) 01/22/2013  . TOBACCO ABUSE 02/18/2008  . CARDIOMYOPATHY 02/18/2008  . DIVERTICULOSIS OF COLON 02/18/2008  . OSTEOPENIA 02/18/2008   Past Medical History:  Past Medical History:  Diagnosis Date  . Cardiomyopathy    with  a history of congestive heart failure and an EF of 25% in 5/08; nl EF  on the echo in 2011  . Chronic combined systolic and diastolic CHF (congestive heart failure) ([place])   . Colon polyps   . Diverticulosis of colon   . Hypertension   . Hypertensive heart disease   . Osteopenia   . Tobacco abuse    50 pack year   Past Surgical History:  Past Surgical History:  Procedure Laterality Date  . BREAST BIOPSY    . CARPAL TUNNEL RELEASE     right hand  . CERVICAL FUSION    . CESAREAN SECTION     2 times  . COLONOSCOPY  [id number]   pt uncertain as to whether polypectomy required or performed  . ECTOPIC PREGNANCY SURGERY    . RIGHT/LEFT HEART CATH AND CORONARY ANGIOGRAPHY N/A 06/03/2017   Procedure: RIGHT/LEFT HEART CATH AND CORONARY ANGIOGRAPHY;  Surgeon: Jettie Booze, MD;  Location: [place] [facility];  Service: Cardiovascular;  Laterality: N/A;  . TONSILLECTOMY     HPI:  Brandy [name] a 77 y.o.femalewith medical history significant of dilated  cardiomyopathy, chronic combined systolic and diastolic congestive heart failure, history of colon polyps and diverticuli, hypertension, hypertensive heart disease, osteopenia, history of tobacco abuse in the past (50-pack-year/quit in 2010) who comes to the ED for evaluation of fall, confusion and slurred speech. MRI shows: Left M2 occlusion with comparatively small acute infarct along the lower left insula and external capsule. Small subacute infarct along the lateral left frontal cortex. 3 mm left superior hypophyseal artery region ICA aneurysm. Negative neck MRA.  No stenosis or embolic source seen. Mild chronic small vessel ischemia in the cerebral white matter. Pt passed RN swallow screen and SLE ordered as part of stroke protocol.    Assessment / Plan / Recommendation Clinical Impression  Pt presents with mild to mild/mod cognitive communication deficits characterized by reduced sustained attention skills, difficulty with planning and organization of complex language tasks, word finding difficulties in conversation, impaired divergent naming, and need for repetition when given complex auditory directions. The [facility] Cognitive Assessment) was given to assist in evaluating [name] current cognitive linguistic skills as well as other informal measures. Pt scored 23/30 with scores above 25 considered normal. 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 She lives at home with her husband and was independent in all cognitive communication domains prior to this hospitalization. Recommend f/u SLP services in the outpatient setting. Pt will benefit from skilled SLP in order  to address the above impairments, maximize independence, and decrease burden of care. Pt is motivated to address her cognitive communication needs and her family appears supportive. Above to RN. SLP will sign off in acute setting, however will follow in outpatient.      SLP  Assessment  SLP Recommendation/Assessment: All further Speech Lanaguage Pathology  needs can be addressed in the next venue of care SLP Visit Diagnosis: Cognitive communication deficit (R41.841)    Follow Up Recommendations  Outpatient SLP    Frequency and Duration           SLP Evaluation Cognition  Overall Cognitive Status: Impaired/Different from baseline Arousal/Alertness: Awake/alert Orientation Level: Oriented X4 Attention: Sustained Sustained Attention: Impaired Sustained Attention Impairment: Functional complex;Verbal complex Memory: Impaired(3/5 recall; ) Memory Impairment: Storage deficit Awareness: Appears intact Problem Solving: Appears intact Executive Function: Sequencing;Organizing;Self Correcting;Self Monitoring Sequencing: Impaired Sequencing Impairment: Verbal complex Organizing: Impaired Organizing Impairment: Verbal complex Self Monitoring: Impaired Self Monitoring Impairment: Functional complex Self Correcting: Impaired Self Correcting Impairment: Functional complex Behaviors: Impulsive Safety/Judgment: Appears intact       Comprehension  Auditory Comprehension Overall Auditory Comprehension: Impaired Yes/No Questions: Within Functional Limits Commands: Impaired Complex Commands: 75-100% accurate Conversation: Complex Interfering Components: Attention EffectiveTechniques: Repetition Visual Recognition/Discrimination Discrimination: Within Function Limits Reading Comprehension Reading Status: Not tested    Expression Expression Primary Mode of Expression: Verbal Verbal Expression Overall Verbal Expression: Impaired Initiation: No impairment Automatic Speech: Name;Social Response;Counting Level of Generative/Spontaneous Verbalization: Conversation Repetition: No impairment Naming: Impairment Responsive: 76-100% accurate Confrontation: Within functional limits Convergent: 75-100% accurate Divergent: 50-74% accurate Pragmatics: No  impairment Interfering Components: Attention Effective Techniques: Open ended questions;Semantic cues Non-Verbal Means of Communication: Not applicable Written Expression Dominant Hand: Right Written Expression: Not tested   Oral / Motor  Oral Motor/Sensory Function Overall Oral Motor/Sensory Function: Within functional limits Motor Speech Overall Motor Speech: Appears within functional limits for tasks assessed Respiration: Within functional limits Phonation: Normal Resonance: Within functional limits Articulation: Within functional limitis Intelligibility: Intelligible Motor Planning: Witnin functional limits Motor Speech Errors: Not applicable   Thank you,  Genene Churn, [place]            [place] 07/13/2017, 12:21 PM

## 2017-07-13 NOTE — Progress Notes (Signed)
*  PRELIMINARY RESULTS* Echocardiogram 2D Echocardiogram limited has been performed.  Brandy [name] 07/13/2017, 11:46 AM

## 2017-07-13 NOTE — Consult Note (Addendum)
[place] A. Merlene Laughter, MD     [email or web address]          Brandy [name] is an 77 y.o. female.   ASSESSMENT/PLAN: 1.  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 Risk factors age, coronary disease with a previous infarct, nicotine use previously and hypertension. The patient will be started on aspirin 162 mg daily. Low-dose statin is recommended. Outpatient speech therapy is also recommended. The patient's brother's physician. Follow-up in the office with [place] in 6 weeks. The case is discussed with the brother on request of the patient.   2. Vitamin B12 This will be replaced.    The patient is a 77 year old right-handed black female who was found on the ground unresponsive. She probably fell out of bed. She seemed to not remember what happened. Subsequently, the patient was noted to be significant difficulties with expressing what she is trying to say and significant props talking. She continues to have problems finding words and say which is trying to say. She denies any focal weakness or numbness. She has never had any episodes like this before. She denies any chest pain or shortness of breath. No GI GU symptoms are reported. The review systems otherwise negative.     GENERAL:  This is a very pleasant female who is in no acute distress.  HEENT: This is normal.  ABDOMEN: soft  EXTREMITIES: No edema   BACK: This is normal.  SKIN: Normal by inspection.    MENTAL STATUS: She is awake and alert. She is oriented including to her age and the month. She has reduced fluency and word finding difficulties with frequent paraphasic errors. Naming is markedly impaired. Comprehension is good.  CRANIAL NERVES: Pupils are equal, round and reactive to light and accomodation; extra ocular movements are full, there is no significant nystagmus; visual fields are full; upper and lower facial  muscles are normal in strength and symmetric, there is no flattening of the nasolabial folds; tongue is midline; uvula is midline; shoulder elevation is normal.  MOTOR: she has weakness of deltoids bilaterally with some associated pain. The strength is graded at 4/5. Distal strength is good. Lower extremities show normal tone, bulk and strength; no pronator drift.  COORDINATION: Left finger to nose is normal, right finger to nose is normal, No rest tremor; no intention tremor; no postural tremor; no bradykinesia.  REFLEXES: Deep tendon reflexes are symmetrical and normal.    SENSATION: Normal to light touch, temperature, and pain. No extinction on double simultaneous stimulation.  GAIT: Normal.     NIH of stroke scale 1.  Blood pressure 140/85, pulse 79, temperature 98.7 F (37.1 C), temperature source Oral, resp. rate 18, height 5' 4.5" (1.638 m), weight 172 lb 2.9 oz (78.1 kg), SpO2 96 %.  Past Medical History:  Diagnosis Date  . Cardiomyopathy    with  a history of congestive heart failure and an EF of 25% in 5/08; nl EF  on the echo in 2011  . Chronic combined systolic and diastolic CHF (congestive heart failure) ([place])   . Colon polyps   . Diverticulosis of colon   . Hypertension   . Hypertensive heart disease   . Osteopenia   . Tobacco abuse    50 pack year    Past Surgical History:  Procedure Laterality Date  . BREAST BIOPSY    . CARPAL TUNNEL RELEASE     right hand  . CERVICAL  FUSION    . CESAREAN SECTION     2 times  . COLONOSCOPY  6468   pt uncertain as to whether polypectomy required or performed  . ECTOPIC PREGNANCY SURGERY    . RIGHT/LEFT HEART CATH AND CORONARY ANGIOGRAPHY N/A 06/03/2017   Procedure: RIGHT/LEFT HEART CATH AND CORONARY ANGIOGRAPHY;  Surgeon: Jettie Booze, MD;  Location: [facility];  Service: Cardiovascular;  Laterality: N/A;  . TONSILLECTOMY      Family History  Problem Relation Age of Onset  . Hypertension Brother   .  Hypertension Maternal Grandmother     Social History:  reports that she quit smoking about 9 years ago. Her smoking use included cigarettes. She started smoking about 56 years ago. She has a 50.00 pack-year smoking history. She has never used smokeless tobacco. She reports that she does not drink alcohol or use drugs.  Allergies:  Allergies  Allergen Reactions  . Lisinopril Swelling    Medications: Prior to Admission medications   Medication Sig Start Date End Date Taking? Authorizing Provider  carvedilol (COREG) 12.5 MG tablet Take 1 tablet (12.5 mg total) by mouth 2 (two) times daily with a meal. 06/07/17  Yes Nita Sells, MD  cetirizine (ZYRTEC) 10 MG tablet Take 10 mg by mouth daily as needed for allergies.    Yes [provider]  furosemide (LASIX) 40 MG tablet Take 1 tablet (40 mg total) by mouth daily. Patient taking differently: Take by mouth. TAKE 40 MG EVERY OTHER DAY ALTERNATING WITH 20 MG 06/07/17  Yes Samtani, Darylene Price, MD  isosorbide-hydrALAZINE (BIDIL) 20-37.5 MG tablet Take 1 tablet by mouth 3 (three) times daily. 06/07/17  Yes Nita Sells, MD  potassium chloride SA (K-DUR,KLOR-CON) 20 MEQ tablet Take 2 tablets (40 mEq total) by mouth daily. 06/07/17  Yes Dunn, Dayna N, PA-C  predniSONE (DELTASONE) 5 MG tablet Take 5 mg by mouth daily as needed (for swelling and right knee pain).   Yes [provider]    Scheduled Meds: . aspirin EC  81 mg Oral Daily  . enoxaparin (LOVENOX) injection  40 mg Subcutaneous Q24H  . furosemide  20 mg Oral QODAY  . pneumococcal 23 valent vaccine  0.5 mL Intramuscular Tomorrow-1000  . potassium chloride SA  40 mEq Oral Daily   Continuous Infusions: PRN Meds:.acetaminophen **OR** acetaminophen, loratadine, ondansetron **OR** ondansetron (ZOFRAN) IV, predniSONE     Results for orders placed or performed during the hospital encounter of 07/12/17 (from the past 48 hour(s))  Urine rapid drug screen (hosp  performed)     Status: None   Collection Time: 07/12/17  5:54 PM  Result Value Ref Range   Opiates NONE DETECTED NONE DETECTED   Cocaine NONE DETECTED NONE DETECTED   Benzodiazepines NONE DETECTED NONE DETECTED   Amphetamines NONE DETECTED NONE DETECTED   Tetrahydrocannabinol NONE DETECTED NONE DETECTED   Barbiturates NONE DETECTED NONE DETECTED    Comment: (NOTE) DRUG SCREEN FOR MEDICAL PURPOSES ONLY.  IF CONFIRMATION IS NEEDED FOR ANY PURPOSE, NOTIFY LAB WITHIN 5 DAYS. LOWEST DETECTABLE LIMITS FOR URINE DRUG SCREEN Drug Class                     Cutoff (ng/mL) Amphetamine and metabolites    1000 Barbiturate and metabolites    200 Benzodiazepine                 032 Tricyclics and metabolites     300 Opiates and metabolites  300 Cocaine and metabolites        300 THC                            50 Performed at [facility], [address] [place], [place] [address]   Urinalysis, Routine w reflex microscopic     Status: None   Collection Time: 07/12/17  5:54 PM  Result Value Ref Range   Color, Urine YELLOW YELLOW   APPearance CLEAR CLEAR   Specific Gravity, Urine 1.016 1.005 - 1.030   pH 7.0 5.0 - 8.0   Glucose, UA NEGATIVE NEGATIVE mg/dL   Hgb urine dipstick NEGATIVE NEGATIVE   Bilirubin Urine NEGATIVE NEGATIVE   Ketones, ur NEGATIVE NEGATIVE mg/dL   Protein, ur NEGATIVE NEGATIVE mg/dL   Nitrite NEGATIVE NEGATIVE   Leukocytes, UA NEGATIVE NEGATIVE    Comment: Performed at [facility], [address], [place], [place] [address]  I-stat troponin, ED     Status: Abnormal   Collection Time: 07/12/17  5:55 PM  Result Value Ref Range   Troponin i, poc 0.13 (HH) 0.00 - 0.08 ng/mL   Comment NOTIFIED PHYSICIAN    Comment 3            Comment: Due to the release kinetics of cTnI, a negative result within the first hours of the onset of symptoms does not rule out myocardial infarction with certainty. If myocardial infarction is still suspected, repeat the test at  appropriate intervals.   Ethanol     Status: None   Collection Time: 07/12/17  5:59 PM  Result Value Ref Range   Alcohol, Ethyl (B) <10 <10 mg/dL    Comment: Performed at [facility], [address] [place], [place] [address]  Protime-INR     Status: None   Collection Time: 07/12/17  5:59 PM  Result Value Ref Range   Prothrombin Time 13.9 11.4 - 15.2 seconds   INR 1.07     Comment: Performed at [facility], [address] [place], [place] [address]  APTT     Status: None   Collection Time: 07/12/17  5:59 PM  Result Value Ref Range   aPTT 27 24 - 36 seconds    Comment: Performed at [facility], [address] [place], [place] [address]  CBC     Status: Abnormal   Collection Time: 07/12/17  5:59 PM  Result Value Ref Range   WBC 7.5 4.0 - 10.5 K/uL   RBC 3.92 3.87 - 5.11 MIL/uL   Hemoglobin 11.2 (L) 12.0 - 15.0 g/dL   HCT 36.2 36.0 - 46.0 %   MCV 92.3 78.0 - 100.0 fL   MCH 28.6 26.0 - 34.0 pg   MCHC 30.9 30.0 - 36.0 g/dL   RDW 14.1 11.5 - 15.5 %   Platelets 268 150 - 400 K/uL    Comment: Performed at [facility], [address], [place], [place] [address]  Differential     Status: None   Collection Time: 07/12/17  5:59 PM  Result Value Ref Range   Neutrophils Relative % 57 %   Neutro Abs 4.3 1.7 - 7.7 K/uL   Lymphocytes Relative 32 %   Lymphs Abs 2.4 0.7 - 4.0 K/uL   Monocytes Relative 9 %   Monocytes Absolute 0.7 0.1 - 1.0 K/uL   Eosinophils Relative 2 %   Eosinophils Absolute 0.1 0.0 - 0.7 K/uL   Basophils Relative 0 %   Basophils  Absolute 0.0 0.0 - 0.1 K/uL    Comment: Performed at [facility], [address] [place], [place] [address]  Comprehensive metabolic panel     Status: Abnormal   Collection Time: 07/12/17  5:59 PM  Result Value Ref Range   Sodium 142 135 - 145 mmol/L   Potassium 4.4 3.5 - 5.1 mmol/L   Chloride 107 101 - 111 mmol/L   CO2 29 22 - 32 mmol/L   Glucose, Bld 94 65 - 99 mg/dL   BUN 22 (H) 6 - 20 mg/dL   Creatinine, Ser 1.25 (H) 0.44 - 1.00  mg/dL   Calcium 9.0 8.9 - 10.3 mg/dL   Total Protein 6.1 (L) 6.5 - 8.1 g/dL   Albumin 3.4 (L) 3.5 - 5.0 g/dL   AST 24 15 - 41 U/L   ALT 12 (L) 14 - 54 U/L   Alkaline Phosphatase 56 38 - 126 U/L   Total Bilirubin 0.9 0.3 - 1.2 mg/dL   GFR calc non Af Amer 41 (L) >60 mL/min   GFR calc Af Amer 48 (L) >60 mL/min    Comment: (NOTE) The eGFR has been calculated using the CKD EPI equation. This calculation has not been validated in all clinical situations. eGFR's persistently <60 mL/min signify possible Chronic Kidney Disease.    Anion gap 6 5 - 15    Comment: Performed at [facility], [address] [place], [place] [address]  Brain natriuretic peptide     Status: Abnormal   Collection Time: 07/12/17  5:59 PM  Result Value Ref Range   B Natriuretic Peptide 642.0 (H) 0.0 - 100.0 pg/mL    Comment: Performed at [facility], [address], [place], [place] [address]  I-stat troponin, ED     Status: Abnormal   Collection Time: 07/12/17  9:12 PM  Result Value Ref Range   Troponin i, poc 0.11 (HH) 0.00 - 0.08 ng/mL   Comment NOTIFIED PHYSICIAN    Comment 3            Comment: Due to the release kinetics of cTnI, a negative result within the first hours of the onset of symptoms does not rule out myocardial infarction with certainty. If myocardial infarction is still suspected, repeat the test at appropriate intervals.   Hemoglobin A1c     Status: None   Collection Time: 07/13/17  4:45 AM  Result Value Ref Range   Hgb A1c MFr Bld 4.8 4.8 - 5.6 %    Comment: (NOTE) Pre diabetes:          5.7%-6.4% Diabetes:              >6.4% Glycemic control for   <7.0% adults with diabetes    Mean Plasma Glucose 91.06 mg/dL    Comment: Performed at [facility] [address] [place], [place] [address]  Lipid panel     Status: None   Collection Time: 07/13/17  4:45 AM  Result Value Ref Range   Cholesterol 136 0 - 200 mg/dL   Triglycerides 52 <150 mg/dL   HDL 41 >40 mg/dL   Total  CHOL/HDL Ratio 3.3 RATIO   VLDL 10 0 - 40 mg/dL   LDL Cholesterol 85 0 - 99 mg/dL    Comment:        Total Cholesterol/HDL:CHD Risk Coronary Heart Disease Risk Table                     Men   Women  1/2 Average Risk   3.4   3.3  Average Risk       5.0   4.4  2 X Average Risk   9.6   7.1  3 X Average Risk  23.4   11.0        Use the calculated Patient Ratio above and the CHD Risk Table to determine the patient's CHD Risk.        ATP III CLASSIFICATION (LDL):  <100     mg/dL   Optimal  100-129  mg/dL   Near or Above                    Optimal  130-159  mg/dL   Borderline  160-189  mg/dL   High  >190     mg/dL   Very High Performed at [place] [place], [place] [address]   Comprehensive metabolic panel     Status: Abnormal   Collection Time: 07/13/17  4:45 AM  Result Value Ref Range   Sodium 145 135 - 145 mmol/L   Potassium 3.9 3.5 - 5.1 mmol/L   Chloride 109 101 - 111 mmol/L   CO2 28 22 - 32 mmol/L   Glucose, Bld 76 65 - 99 mg/dL   BUN 20 6 - 20 mg/dL   Creatinine, Ser 1.15 (H) 0.44 - 1.00 mg/dL   Calcium 8.5 (L) 8.9 - 10.3 mg/dL   Total Protein 5.6 (L) 6.5 - 8.1 g/dL   Albumin 3.1 (L) 3.5 - 5.0 g/dL   AST 17 15 - 41 U/L   ALT 10 (L) 14 - 54 U/L   Alkaline Phosphatase 52 38 - 126 U/L   Total Bilirubin 0.8 0.3 - 1.2 mg/dL   GFR calc non Af Amer 46 (L) >60 mL/min   GFR calc Af Amer 53 (L) >60 mL/min    Comment: (NOTE) The eGFR has been calculated using the CKD EPI equation. This calculation has not been validated in all clinical situations. eGFR's persistently <60 mL/min signify possible Chronic Kidney Disease.    Anion gap 8 5 - 15    Comment: Performed at [facility], [address], [place], [place] [address]  CBC     Status: Abnormal   Collection Time: 07/13/17  4:45 AM  Result Value Ref Range   WBC 6.5 4.0 - 10.5 K/uL   RBC 3.66 (L) 3.87 - 5.11 MIL/uL   Hemoglobin 10.2 (L) 12.0 - 15.0 g/dL   HCT 33.9 (L) 36.0 - 46.0 %   MCV 92.6 78.0 -  100.0 fL   MCH 27.9 26.0 - 34.0 pg   MCHC 30.1 30.0 - 36.0 g/dL   RDW 13.9 11.5 - 15.5 %   Platelets 232 150 - 400 K/uL    Comment: Performed at [facility], [address], [place], [place] [address]  Vitamin B12     Status: Abnormal   Collection Time: 07/13/17  4:45 AM  Result Value Ref Range   Vitamin B-12 164 (L) 180 - 914 pg/mL    Comment: (NOTE) This assay is not validated for testing neonatal or myeloproliferative syndrome specimens for Vitamin B12 levels. Performed at [facility], [place] [address], [place], [place] [address]   Folate     Status: None   Collection Time: 07/13/17  4:45 AM  Result Value Ref Range   Folate 10.6 >5.9 ng/mL    Comment: Performed at [facility] [address], [place], [place]  [address]  Iron and TIBC     Status: None   Collection Time: 07/13/17  4:45 AM  Result Value Ref Range   Iron 71 28 - 170 ug/dL   TIBC 267 250 - 450 ug/dL   Saturation Ratios 27 10.4 - 31.8 %   UIBC 196 ug/dL    Comment: Performed at [facility], [place] [address] [place], [place] [address]  Ferritin     Status: None   Collection Time: 07/13/17  4:45 AM  Result Value Ref Range   Ferritin 61 11 - 307 ng/mL    Comment: Performed at [facility] [address] [place], [place] [address]  Reticulocytes     Status: Abnormal   Collection Time: 07/13/17  4:45 AM  Result Value Ref Range   Retic Ct Pct 1.9 0.4 - 3.1 %   RBC. 3.66 (L) 3.87 - 5.11 MIL/uL   Retic Count, Absolute 69.5 19.0 - 186.0 K/uL    Comment: Performed at [facility], [address] [place], [place] [address]    Studies/Results:  BRAIN MRI MRA AND NECK MRA CONTRAST:  96m MULTIHANCE GADOBENATE DIMEGLUMINE 529 MG/ML IV SOLN  COMPARISON:  06/25/2005  FINDINGS: MR HEAD FINDINGS  Brain: Restricted diffusion along the lower left insula and in the subinsular white matter. Weakly restricted diffusion in the low and lateral left frontal cortex.  There is mild for age  chronic small vessel ischemic type change in the cerebral white matter. Small remote left inferior cerebellar infarct.  No hemorrhage, hydrocephalus, or masslike finding.  Vascular: Arterial findings below. There is increased FLAIR signal within left M3 and M4 branches attributed the slow flow or occlusion based on below. Normal dural venous sinus flow voids.  Skull and upper cervical spine: No evidence of marrow lesion. C3-4 disc degeneration and left-sided facet spurring with disc impinging on the cord.  Sinuses/Orbits: Bilateral cataract resection.  No acute finding.  MR CIRCLE OF WILLIS FINDINGS  Symmetric carotid and vertebral arteries.  Proximal left M2 branch occlusion.  No more proximal flow limiting stenosis or occlusion seen.  Medially and slightly inferiorly directed 3 mm saccular outpouching from the left ICA at the superior hypophyseal level.  MRA NECK FINDINGS  Time-of-flight shows antegrade flow in both carotid and vertebral arteries.  Postcontrast imaging shows normal diameter aortic arch. Vertebral and basilar arteries are smooth and diffusely patent.  Based on the chart it appears the patient is outside of the treatment window and near baseline. A call has been placed to the ordering provider.  IMPRESSION: 1. Left M2 occlusion with comparatively small acute infarct along the lower left insula and external capsule. Small subacute infarct along the lateral left frontal cortex. 2. 3 mm left superior hypophyseal artery region ICA aneurysm. 3. Negative neck MRA.  No stenosis or embolic source seen. 4. Mild chronic small vessel ischemia in the cerebral white matter.     ECHO - Left ventricle: The cavity size was normal. Wall thickness was   increased in a pattern of moderate LVH. Systolic function was   mildly to moderately reduced. The estimated ejection fraction was   = 40%. Diffuse hypokinesis. - Aortic valve: Mildly calcified  annulus. Trileaflet; normal   thickness leaflets. Valve area (VTI): 1.88 cm^2. Valve area   (Vmax): 2.07 cm^2. - Mitral valve: Mildly calcified annulus. Normal thickness leaflets   . There was mild to moderate regurgitation. The MR VC is 0.3 cm. - Left atrium: The atrium was moderately  dilated. - Atrial septum: No defect or patent foramen ovale was identified.     The brain MRI is reviewed in person.There is a moderate size infarct involving the white matter lesion of the insular cortex extending to the medial temporal region. The lateral aspect of the basal ganglia may be involved. There is corresponding reduced signal seen on the ADC scan. The findings are consistent with acute ischemic stroke. There is somewhat similar findings involving the lateral parietal region which seems more subacute. T1 shows evidence of encephalomalacia involving the same area of the lateral basal ganglia extending to the medial temporal region. This appears to be a remote infarct and a proximal same distribution as the current acute ischemic stroke. No hemorrhages appreciated. There is occlusion of the M2 segment on the left.      Elveria Lauderbaugh A. Merlene Laughter, M.D.  Diplomate, Tax adviser of Psychiatry and Neurology ( Neurology). 07/13/2017, 6:44 PM

## 2017-07-13 NOTE — Care Management Obs Status (Signed)
[facility] NOTIFICATION   Patient Details  Name: Brandy [name] MRN: [id number] Date of Birth: 1940-06-21   Medicare Observation Status Notification Given:  Yes    Sherald Barge, RN 07/13/2017, 11:18 AM

## 2017-07-13 NOTE — Evaluation (Signed)
Occupational Therapy Evaluation Patient Details Name: Brandy [name] MRN: [id number] DOB: [birth year] Today's Date: 07/13/2017    History of Present Illness Brandy [name] is a 77 y.o. female with medical history significant of dilated cardiomyopathy, chronic combined systolic and diastolic congestive heart failure, history of colon polyps and diverticuli, hypertension, hypertensive heart disease, osteopenia, history of tobacco abuse in the past (50-pack-year/quit in 2010) who comes to the ED for evaluation of fall, confusion and slurred speech. Per patient, she was found to have fallen out of bed this morning, around 0200.  She does not know exactly how she fell.  Her husband helped her back to bed, but when she woke up her family noticed that she was having difficulty speaking (specifically slurring speech and finding words to talk) and confused.  This has improved some during the day, her family members present in the ED room and stated that she is not slurring his speech anymore at this time, although she is still having some difficulty expressing what she wants to say.  The patient denies headache, blurred vision, gait abnormality, focal weakness or numbness.   Clinical Impression   Pt received supine in bed, agreeable to OT evaluation this am, husband present. Pt initially reporting no difficulty with speech, however OT notes word finding difficulty with open ended questions, pt acknowledged when OT brought attention to her. Pt also reporting no weakness in UEs, on MMT RUE 3+/5 with pronator drift compared to LUE at 4+/5 without drift. Slightly decreased grip strength in RUE as well. Coordination is intact, sensation appears intact however pt unable to comprehend instructions for formal testing therefore may not be accurate. Pt able to perform seated and standing ADLs using RUE as dominant without difficulty, supervision for standing tasks however pt had no LOB. Discussed with pt and recommend  outpatient OT services on discharge to improve RUE strength and grip strength. No further acute OT services at this time.     Follow Up Recommendations  Outpatient OT    Equipment Recommendations  None recommended by OT       Precautions / Restrictions Precautions Precautions: None Restrictions Weight Bearing Restrictions: No      Mobility Bed Mobility Overal bed mobility: Modified Independent                Transfers Overall transfer level: Needs assistance Equipment used: None Transfers: Sit to/from Stand Sit to Stand: Supervision                  ADL either performed or assessed with clinical judgement   ADL Overall ADL's : Needs assistance/impaired     Grooming: Wash/dry hands;Oral care;Supervision/safety;Standing               Lower Body Dressing: Modified independent;Sitting/lateral leans               Functional mobility during ADLs: Supervision/safety General ADL Comments: Pt performing ADLs using RUE as dominant     Vision Baseline Vision/History: No visual deficits Patient Visual Report: No change from baseline Vision Assessment?: Yes Eye Alignment: Within Functional Limits Ocular Range of Motion: Within Functional Limits Alignment/Gaze Preference: Within Defined Limits Tracking/Visual Pursuits: Able to track stimulus in all quads without difficulty Saccades: Within functional limits Convergence: Within functional limits Visual Fields: No apparent deficits            Pertinent Vitals/Pain Pain Assessment: No/denies pain     Hand Dominance Right   Extremity/Trunk Assessment Upper Extremity Assessment Upper Extremity Assessment: RUE deficits/detail  RUE Deficits / Details: RUE strength 3+/5, decreased grip strength   Lower Extremity Assessment Lower Extremity Assessment: Defer to PT evaluation   Cervical / Trunk Assessment Cervical / Trunk Assessment: Normal   Communication Communication Communication: Expressive  difficulties   Cognition Arousal/Alertness: Awake/alert Behavior During Therapy: WFL for tasks assessed/performed Overall Cognitive Status: Within Functional Limits for tasks assessed                                                Home Living Family/patient expects to be discharged to:: Private residence Living Arrangements: Spouse/significant other Available Help at Discharge: Family;Available 24 hours/day Type of Home: House Home Access: Stairs to enter [employer] of Steps: 12 Entrance Stairs-Rails: Right;Left;Can reach both Home Layout: One level     Bathroom Shower/Tub: Tub/shower unit;Walk-in shower(pt uses tub shower)   Bathroom Toilet: Standard     Home Equipment: Shower seat          Prior Functioning/Environment Level of Independence: Independent        Comments: independent in B/IADLs, drives        OT Problem List: Decreased strength;Decreased activity tolerance;Decreased safety awareness      OT Treatment/Interventions:      OT Goals(Current goals can be found in the care plan section) Acute Rehab OT Goals Patient Stated Goal: to go home  OT Frequency:      AM-PAC PT "6 Clicks" Daily Activity     Outcome Measure Help from another person eating meals?: None Help from another person taking care of personal grooming?: None Help from another person toileting, which includes using toliet, bedpan, or urinal?: None Help from another person bathing (including washing, rinsing, drying)?: None Help from another person to put on and taking off regular upper body clothing?: None Help from another person to put on and taking off regular lower body clothing?: None 6 Click Score: 24   End of Session Equipment Utilized During Treatment: Gait belt  Activity Tolerance: Patient tolerated treatment well Patient left: in bed;with call Heyer/phone within reach;with family/visitor present  OT Visit Diagnosis: Muscle weakness  (generalized) (M62.81)                Time: 9563-8756 OT Time Calculation (min): 18 min Charges:  OT General Charges $OT Visit: 1 Visit OT Evaluation $OT Eval Low Complexity: 1 Low   Guadelupe Sabin, OTR/L  [phone] 07/13/2017, 7:59 AM

## 2017-07-13 NOTE — Progress Notes (Signed)
Patient down at MRI/MRA, unable to take 0830 vitals/neuro check. Vitals and neuro checks done at 0910, when patient arrived back to unit. Will resume schedule at 1030.  Celestia Khat, RN

## 2017-07-13 NOTE — Care Management Note (Signed)
Case Management Note  Patient Details  Name: Brandy [name] MRN: [id number] Date of Birth: Jun 08, 1940  Subjective/Objective:        Admitted with CVA. Pt from home with husband. ind pta. Has PCP, drives and has drug coverage.             Action/Plan: DC home after work up complete. OP PT and SLP recommended. Pt declines services at this time.   Expected Discharge Date:    07/13/2017              Expected Discharge Plan:    Home with Self Care  In-House Referral:   NA  Discharge planning Services   Case Mandagement  Post Acute Care Choice:   NA Choice offered to:   NA  DME Arranged:   NA DME Agency:   NA  HH Arranged:   NA HH Agency:   NA  Status of Service:   COMPLETED.   Sherald Barge, RN 07/13/2017, 12:28 PM

## 2017-07-13 NOTE — Evaluation (Signed)
Physical Therapy Evaluation Patient Details Name: Brandy [name] MRN: [id number] DOB: 02/03/1941 Today's Date: 07/13/2017   History of Present Illness  Brandy [name] is a 77 y.o. female with medical history significant of dilated cardiomyopathy, chronic combined systolic and diastolic congestive heart failure, history of colon polyps and diverticuli, hypertension, hypertensive heart disease, osteopenia, history of tobacco abuse in the past (50-pack-year/quit in 2010) who comes to the ED for evaluation of fall, confusion and slurred speech. Per patient, she was found to have fallen out of bed this morning, around 0200.  She does not know exactly how she fell.  Her husband helped her back to bed, but when she woke up her family noticed that she was having difficulty speaking (specifically slurring speech and finding words to talk) and confused.  This has improved some during the day, her family members present in the ED room and stated that she is not slurring his speech anymore at this time, although she is still having some difficulty expressing what she wants to say.  The patient denies headache, blurred vision, gait abnormality, focal weakness or numbness.    Clinical Impression  Patient functioning at baseline for functional mobility and gait.  Plan: patient discharged from physical therapy to care of nursing for ambulation ad lib for length of stay.    Follow Up Recommendations No PT follow up;Supervision - Intermittent    Equipment Recommendations  None recommended by PT    Recommendations for Other Services       Precautions / Restrictions Precautions Precautions: None Restrictions Weight Bearing Restrictions: No      Mobility  Bed Mobility Overal bed mobility: Modified Independent                Transfers Overall transfer level: Independent   Transfers: Sit to/from [employer] to Stand: Independent Stand pivot transfers: Independent           Ambulation/Gait Ambulation/Gait assistance: Independent Ambulation Distance (Feet): 100 Feet Assistive device: None Gait Pattern/deviations: WFL(Within Functional Limits) Gait velocity: normal      Stairs            Wheelchair Mobility    Modified Rankin (Stroke Patients Only)       Balance Overall balance assessment: No apparent balance deficits (not formally assessed)                                           Pertinent Vitals/Pain Pain Assessment: No/denies pain    Home Living Family/patient expects to be discharged to:: Private residence Living Arrangements: Spouse/significant other Available Help at Discharge: Family;Available 24 hours/day Type of Home: House Home Access: Stairs to enter Entrance Stairs-Rails: None Entrance Stairs-Number of Steps: 1 Home Layout: Two level Home Equipment: Shower seat Additional Comments: Patient states no assistive devices other than shower seat    Prior Function Level of Independence: Independent         Comments: community ambulator, drives     Hand Dominance   Dominant Hand: Right    Extremity/Trunk Assessment   Upper Extremity Assessment Upper Extremity Assessment: Defer to OT evaluation    Lower Extremity Assessment Lower Extremity Assessment: Overall WFL for tasks assessed    Cervical / Trunk Assessment Cervical / Trunk Assessment: Normal  Communication   Communication: Expressive difficulties  Cognition Arousal/Alertness: Awake/alert Behavior During Therapy: WFL for tasks assessed/performed Overall Cognitive Status: Within Functional Limits  for tasks assessed                                        General Comments      Exercises     Assessment/Plan    PT Assessment Patent does not need any further PT services  PT Problem List         PT Treatment Interventions      PT Goals (Current goals can be found in the Care Plan section)  Acute Rehab PT  Goals Patient Stated Goal: return home  PT Goal Formulation: With patient/family Time For Goal Achievement: 10-Aug-2017 Potential to Achieve Goals: Good    Frequency     Barriers to discharge        Co-evaluation               AM-PAC PT "6 Clicks" Daily Activity  Outcome Measure Difficulty turning over in bed (including adjusting bedclothes, sheets and blankets)?: None Difficulty moving from lying on back to sitting on the side of the bed? : None Difficulty sitting down on and standing up from a chair with arms (e.g., wheelchair, bedside commode, etc,.)?: None Help needed moving to and from a bed to chair (including a wheelchair)?: None Help needed walking in hospital room?: None Help needed climbing 3-5 steps with a railing? : None 6 Click Score: 24    End of Session   Activity Tolerance: Patient tolerated treatment well Patient left: in bed;with call Touch/phone within reach;with family/visitor present Nurse Communication: Mobility status PT Visit Diagnosis: Unsteadiness on feet (R26.81);Other abnormalities of gait and mobility (R26.89);Muscle weakness (generalized) (M62.81)    Time: 5631-4970 PT Time Calculation (min) (ACUTE ONLY): 25 min   Charges:   PT Evaluation $PT Eval Moderate Complexity: 1 Mod PT Treatments $Therapeutic Activity: 23-37 mins   PT G Codes:        11:08 AM, 2017/08/10 Lonell Grandchild, MPT Physical Therapist with [facility] 336 [phone] office [phone] mobile phone

## 2017-07-14 DIAGNOSIS — I639 Cerebral infarction, unspecified: Secondary | ICD-10-CM

## 2017-07-14 DIAGNOSIS — I10 Essential (primary) hypertension: Secondary | ICD-10-CM | POA: Diagnosis not present

## 2017-07-14 DIAGNOSIS — D519 Vitamin B12 deficiency anemia, unspecified: Secondary | ICD-10-CM | POA: Diagnosis not present

## 2017-07-14 DIAGNOSIS — I5042 Chronic combined systolic (congestive) and diastolic (congestive) heart failure: Secondary | ICD-10-CM | POA: Diagnosis not present

## 2017-07-14 DIAGNOSIS — R748 Abnormal levels of other serum enzymes: Secondary | ICD-10-CM | POA: Diagnosis not present

## 2017-07-14 MED ORDER — FUROSEMIDE 20 MG PO TABS: 20.0000 mg | ORAL_TABLET | ORAL | Status: DC

## 2017-07-14 MED ORDER — ASPIRIN 81 MG PO TBEC: 162.0000 mg | DELAYED_RELEASE_TABLET | Freq: Every day | ORAL | 1 refills | Status: DC

## 2017-07-14 MED ORDER — POTASSIUM CHLORIDE CRYS ER 20 MEQ PO TBCR: 20.0000 meq | EXTENDED_RELEASE_TABLET | Freq: Every day | ORAL | Status: DC

## 2017-07-14 MED ORDER — ATORVASTATIN CALCIUM 10 MG PO TABS: 10.0000 mg | ORAL_TABLET | Freq: Every day | ORAL | 1 refills | Status: DC

## 2017-07-14 MED ORDER — CYANOCOBALAMIN 1000 MCG/ML IJ SOLN: INTRAMUSCULAR | 0 refills | Status: DC

## 2017-07-14 MED ORDER — ISOSORB DINITRATE-HYDRALAZINE 20-37.5 MG PO TABS: 1.0000 | ORAL_TABLET | Freq: Three times a day (TID) | ORAL | 1 refills | Status: DC

## 2017-07-14 MED ORDER — CYANOCOBALAMIN 1000 MCG/ML IJ SOLN
1000.0000 ug | INTRAMUSCULAR | Status: AC
  Administered 2017-07-14: 1000 ug via INTRAMUSCULAR
  Filled 2017-07-14: qty 1

## 2017-07-14 NOTE — Discharge Summary (Signed)
Physician Discharge Summary  Brandy [name] WSF:[id number] DOB: 08-26-1940 DOA: 07/12/2017  PCP: [name], MD  Admit date: 07/12/2017 Discharge date: 07/14/2017  Time spent:  35 minutes  Recommendations for Outpatient Follow-up:  Repeat CBC to follow hemoglobin trend Follow patient's volume status and further adjust CHF regimen as needed. Outpatient follow-up with cardiology service as previously instructed and follow-up with neurology in 6 weeks. Repeat basic metabolic panel to follow electrolytes and renal function Repeat LFTs in about 6 weeks to assess liver function test after initiation of a statins. Repeat B12 levels in about 4-12-month to assess response to repletion and further determine maintenance (patient was discharged with instructions to take B12 shots daily for a week, weekly for a month and after that a monthly basis).  Discharge Diagnoses:  Principal Problem:   TIA (transient ischemic attack) Active Problems:   Chronic combined systolic and diastolic CHF (congestive heart failure) (HCC)   Hypertension   Elevated troponin   Anemia   Seasonal allergies   Acute ischemic stroke [facility])   Discharge Condition: Stable and improved.  Patient discharged with arrangement for outpatient speech therapy  rehabilitation and instruction to follow-up with her PCP in 10 days.  Patient will also follow-up with neurology in 6 weeks.  Diet recommendation: Heart healthy diet.  Filed Weights   07/12/17 2335 07/13/17 0030  Weight: 78.1 kg (172 lb 2.9 oz) 78.1 kg (172 lb 2.9 oz)    History of present illness:  77 y.o.femalewith medical history significant of dilated cardiomyopathy, chronic combined systolic and diastolic congestive heart failure, history of colon polyps and diverticuli, hypertension, hypertensive heart disease, osteopenia, history of tobacco abuse in the past (50-pack-year/quit in 2010) who comes to the ED for evaluation of fall, confusion and slurred speech.  Found  to have acute stroke.   Hospital Course:  1-Acute ischemic stroke: -patient with improvement in neurologic exam -MRI demonstrating small ischemic stroke -outpatient follow up with neurology in 6 weeks. -started on 163mg  of  ASA and statins (lipitor) -no preventive meds prior to admission  -allowed to have permissive HTN -OT and SPL, recommending outpatient therapy.  Patient has declined occupational therapy services and will only follow with speech after discharge. -no source of thrombi on Echo and patent neck vasculature.   2-HTN -patient allowed to have permissive HTN in the setting of acute CVA. -continue slowly controlling BP -continue heart healthy diet   3-chronic combined CHF -EF 40% -overall compensated  -follow daily weights and low sodium diet  -resume home meds at discharge  -patient allergic to ACE; will resume b-blocker BIDIL and furosemide   4-elevated troponin -mild demand ischemia in the setting of acute stroke  -no CP or SOB -continue monitoring on telemetry  -Outpatient follow-up with cardiology service.  5-anemia due to B12 deficiency: -No signs of acute bleeding appreciated -Follow CBC as an outpatient to reevaluate hemoglobin trend -Patient started on supplementation/repletion of B12 following daily shots for a week, weekly shots for a month and then monthly after that. -Repeat B12 level as an outpatient to reassess repletion and appropriate maintenance dose in the future. -B12 level 164 currently  6-Seasonal rhinitis  -continue Zyrtec   Procedures: See below for x-ray report 2D echo Carotid Dopplers  Consultations:  Neurology  Discharge Exam: Vitals:   07/14/17 0232 07/14/17 0635  BP: 127/85 (!) 141/93  Pulse: 90 70  Resp:    Temp:  98.7 F (37.1 C)  SpO2: 93% 94%    General:  In no distress;  patient with very mild wernicke aphasia; otherwise neurologically and motor intact.  Cardiovascular: S1 and S2, no rubs, no  gallops  Respiratory: CTA bilaterally, no using accessory muscles.  Abdomen: soft, NT, ND, positive BS  Musculoskeletal: no edema, no cyanosis, no clubbing  Neurology: CN intact, able to follow 3 steps commands, no pronator drift, mild wernicke aphasia, MS 5/5   Discharge Instructions   Discharge Instructions    (HEART FAILURE PATIENTS) Call MD:  Anytime you have any of the following symptoms: 1) 3 pound weight gain in 24 hours or 5 pounds in 1 week 2) shortness of breath, with or without a dry hacking cough 3) swelling in the hands, feet or stomach 4) if you have to sleep on extra pillows at night in order to breathe.   Complete by:  As directed    Diet - low sodium heart healthy   Complete by:  As directed    Diet - low sodium heart healthy   Complete by:  As directed    Discharge instructions   Complete by:  As directed    Take medications as prescribed  Follow up with neurology in 6 weeks Arrange follow up with PCP in 2 weeks Follow up with cardiology service as previously instructed  Please follow heart healthy diet (less than 2 gram daily) and maintain adequate hydration   Heart Failure patients record your daily weight using the same scale at the same time of day   Complete by:  As directed    Increase activity slowly   Complete by:  As directed    STOP any activity that causes chest pain, shortness of breath, dizziness, sweating, or exessive weakness   Complete by:  As directed      Allergies as of 07/14/2017      Reactions   Lisinopril Swelling      Medication List    TAKE these medications   aspirin 81 MG EC tablet Take 2 tablets (162 mg total) by mouth daily.   atorvastatin 10 MG tablet Commonly known as:  LIPITOR Take 1 tablet (10 mg total) by mouth daily at 6 PM.   carvedilol 12.5 MG tablet Commonly known as:  COREG Take 1 tablet (12.5 mg total) by mouth 2 (two) times daily with a meal.   cetirizine 10 MG tablet Commonly known as:  ZYRTEC Take 10  mg by mouth daily as needed for allergies.   cyanocobalamin 1000 MCG/ML injection Commonly known as:  (VITAMIN B-12) Daily for 1 week, then weekly for 1 month and then monthly (for 8 months)   furosemide 20 MG tablet Commonly known as:  LASIX Take 1 tablet (20 mg total) by mouth every other day. Start taking on:  07/15/2017 What changed:    medication strength  how much to take  when to take this   isosorbide-hydrALAZINE 20-37.5 MG tablet Commonly known as:  BIDIL Take 1 tablet by mouth 3 (three) times daily.   potassium chloride SA 20 MEQ tablet Commonly known as:  K-DUR,KLOR-CON Take 1 tablet (20 mEq total) by mouth daily. What changed:  how much to take   predniSONE 5 MG tablet Commonly known as:  DELTASONE Take 5 mg by mouth daily as needed (for swelling and right knee pain).      Allergies  Allergen Reactions  . Lisinopril Swelling   Follow-up Information    [name], MD. Schedule an appointment as soon as possible for a visit in 2 week(s).   Specialty:  Family Medicine Contact information: [address] [place] [address] [phone]        Phillips Odor, MD. Schedule an appointment as soon as possible for a visit in 6 week(s).   Specialty:  Neurology Contact information: [address] [place] [address] [phone]            The results of significant diagnostics from this hospitalization (including imaging, microbiology, ancillary and laboratory) are listed below for reference.    Significant Diagnostic Studies: Dg Chest 2 View  Result Date: 07/12/2017 CLINICAL DATA:  77 y/o  F; shortness of breath. EXAM: CHEST - 2 VIEW COMPARISON:  06/05/2017 chest radiograph FINDINGS: Stable cardiomegaly given projection and technique. Aortic atherosclerosis with calcification. Decreased small left pleural effusion with trace residual. Pulmonary vascular congestion. No consolidation. No pneumothorax. Anterior cervical fusion hardware  noted. IMPRESSION: Stable cardiomegaly. Pulmonary vascular congestion. Decreased small left effusion. No focal consolidation. Electronically Signed   By: Kristine Garbe M.D.   On: 07/12/2017 18:43   Ct Head Wo Contrast  Result Date: 07/12/2017 CLINICAL DATA:  Confusion and slurred speech after fall EXAM: CT HEAD WITHOUT CONTRAST TECHNIQUE: Contiguous axial images were obtained from the base of the skull through the vertex without intravenous contrast. COMPARISON:  None. FINDINGS: Brain: The ventricles are normal in size and configuration. There is no appreciable intracranial mass, hemorrhage, extra-axial fluid collection, or midline shift. There is mild small vessel disease in the centra semiovale bilaterally. Elsewhere gray-white compartments appear normal. No evident acute infarct. Vascular: No hyperdense vessel. There is calcification in both carotid siphon and distal vertebral artery regions. Skull: The bony calvarium appears intact. Sinuses/Orbits: There is mucosal thickening in several ethmoid air cells. There is slight mucosal thickening in each anterior sphenoid sinus region. There is also mild mucosal thickening in the inferior right frontal sinus. Other visualized paranasal sinuses are clear. Visualized orbits appear symmetric bilaterally. Other: Visualized mastoid air cells are clear. IMPRESSION: Mild periventricular small vessel disease. No acute infarct is demonstrable. No mass or hemorrhage appreciable. No extra-axial fluid collections. No midline shift. Foci of arterial vascular calcification noted. Areas of paranasal sinus disease present. Electronically Signed   By: Lowella Grip III M.D.   On: 07/12/2017 18:48   Mr Jodene Nam Neck W Wo Contrast  Result Date: 07/13/2017 CLINICAL DATA:  Weakness and confusion for 1 day EXAM: MR HEAD WITHOUT CONTRAST MR CIRCLE OF WILLIS WITHOUT CONTRAST MRA OF THE NECK WITHOUT AND WITH CONTRAST TECHNIQUE: Multiplanar, multiecho pulse sequences of the  brain, circle of willis and surrounding structures were obtained without intravenous contrast. Angiographic images of the neck were obtained using MRA technique without and with intravenous contrast. CONTRAST:  54mL MULTIHANCE GADOBENATE DIMEGLUMINE 529 MG/ML IV SOLN COMPARISON:  06/25/2005 FINDINGS: MR HEAD FINDINGS Brain: Restricted diffusion along the lower left insula and in the subinsular white matter. Weakly restricted diffusion in the low and lateral left frontal cortex. There is mild for age chronic small vessel ischemic type change in the cerebral white matter. Small remote left inferior cerebellar infarct. No hemorrhage, hydrocephalus, or masslike finding. Vascular: Arterial findings below. There is increased FLAIR signal within left M3 and M4 branches attributed the slow flow or occlusion based on below. Normal dural venous sinus flow voids. Skull and upper cervical spine: No evidence of marrow lesion. C3-4 disc degeneration and left-sided facet spurring with disc impinging on the cord. Sinuses/Orbits: Bilateral cataract resection.  No acute finding. MR CIRCLE OF WILLIS FINDINGS Symmetric carotid and  vertebral arteries. Proximal left M2 branch occlusion. No more proximal flow limiting stenosis or occlusion seen. Medially and slightly inferiorly directed 3 mm saccular outpouching from the left ICA at the superior hypophyseal level. MRA NECK FINDINGS Time-of-flight shows antegrade flow in both carotid and vertebral arteries. Postcontrast imaging shows normal diameter aortic arch. Vertebral and basilar arteries are smooth and diffusely patent. Based on the chart it appears the patient is outside of the treatment window and near baseline. A call has been placed to the ordering provider. IMPRESSION: 1. Left M2 occlusion with comparatively small acute infarct along the lower left insula and external capsule. Small subacute infarct along the lateral left frontal cortex. 2. 3 mm left superior hypophyseal artery  region ICA aneurysm. 3. Negative neck MRA.  No stenosis or embolic source seen. 4. Mild chronic small vessel ischemia in the cerebral white matter. Electronically Signed   By: Monte Fantasia M.D.   On: 07/13/2017 09:43   Mr Brain Wo Contrast  Result Date: 07/13/2017 CLINICAL DATA:  Weakness and confusion for 1 day EXAM: MR HEAD WITHOUT CONTRAST MR CIRCLE OF WILLIS WITHOUT CONTRAST MRA OF THE NECK WITHOUT AND WITH CONTRAST TECHNIQUE: Multiplanar, multiecho pulse sequences of the brain, circle of willis and surrounding structures were obtained without intravenous contrast. Angiographic images of the neck were obtained using MRA technique without and with intravenous contrast. CONTRAST:  30mL MULTIHANCE GADOBENATE DIMEGLUMINE 529 MG/ML IV SOLN COMPARISON:  06/25/2005 FINDINGS: MR HEAD FINDINGS Brain: Restricted diffusion along the lower left insula and in the subinsular white matter. Weakly restricted diffusion in the low and lateral left frontal cortex. There is mild for age chronic small vessel ischemic type change in the cerebral white matter. Small remote left inferior cerebellar infarct. No hemorrhage, hydrocephalus, or masslike finding. Vascular: Arterial findings below. There is increased FLAIR signal within left M3 and M4 branches attributed the slow flow or occlusion based on below. Normal dural venous sinus flow voids. Skull and upper cervical spine: No evidence of marrow lesion. C3-4 disc degeneration and left-sided facet spurring with disc impinging on the cord. Sinuses/Orbits: Bilateral cataract resection.  No acute finding. MR CIRCLE OF WILLIS FINDINGS Symmetric carotid and vertebral arteries. Proximal left M2 branch occlusion. No more proximal flow limiting stenosis or occlusion seen. Medially and slightly inferiorly directed 3 mm saccular outpouching from the left ICA at the superior hypophyseal level. MRA NECK FINDINGS Time-of-flight shows antegrade flow in both carotid and vertebral arteries.  Postcontrast imaging shows normal diameter aortic arch. Vertebral and basilar arteries are smooth and diffusely patent. Based on the chart it appears the patient is outside of the treatment window and near baseline. A call has been placed to the ordering provider. IMPRESSION: 1. Left M2 occlusion with comparatively small acute infarct along the lower left insula and external capsule. Small subacute infarct along the lateral left frontal cortex. 2. 3 mm left superior hypophyseal artery region ICA aneurysm. 3. Negative neck MRA.  No stenosis or embolic source seen. 4. Mild chronic small vessel ischemia in the cerebral white matter. Electronically Signed   By: Monte Fantasia M.D.   On: 07/13/2017 09:43   Mr Jodene Nam Head/brain PP Cm  Result Date: 07/13/2017 CLINICAL DATA:  Weakness and confusion for 1 day EXAM: MR HEAD WITHOUT CONTRAST MR CIRCLE OF WILLIS WITHOUT CONTRAST MRA OF THE NECK WITHOUT AND WITH CONTRAST TECHNIQUE: Multiplanar, multiecho pulse sequences of the brain, circle of willis and surrounding structures were obtained without intravenous contrast. Angiographic images of the neck  were obtained using MRA technique without and with intravenous contrast. CONTRAST:  75mL MULTIHANCE GADOBENATE DIMEGLUMINE 529 MG/ML IV SOLN COMPARISON:  06/25/2005 FINDINGS: MR HEAD FINDINGS Brain: Restricted diffusion along the lower left insula and in the subinsular white matter. Weakly restricted diffusion in the low and lateral left frontal cortex. There is mild for age chronic small vessel ischemic type change in the cerebral white matter. Small remote left inferior cerebellar infarct. No hemorrhage, hydrocephalus, or masslike finding. Vascular: Arterial findings below. There is increased FLAIR signal within left M3 and M4 branches attributed the slow flow or occlusion based on below. Normal dural venous sinus flow voids. Skull and upper cervical spine: No evidence of marrow lesion. C3-4 disc degeneration and left-sided  facet spurring with disc impinging on the cord. Sinuses/Orbits: Bilateral cataract resection.  No acute finding. MR CIRCLE OF WILLIS FINDINGS Symmetric carotid and vertebral arteries. Proximal left M2 branch occlusion. No more proximal flow limiting stenosis or occlusion seen. Medially and slightly inferiorly directed 3 mm saccular outpouching from the left ICA at the superior hypophyseal level. MRA NECK FINDINGS Time-of-flight shows antegrade flow in both carotid and vertebral arteries. Postcontrast imaging shows normal diameter aortic arch. Vertebral and basilar arteries are smooth and diffusely patent. Based on the chart it appears the patient is outside of the treatment window and near baseline. A call has been placed to the ordering provider. IMPRESSION: 1. Left M2 occlusion with comparatively small acute infarct along the lower left insula and external capsule. Small subacute infarct along the lateral left frontal cortex. 2. 3 mm left superior hypophyseal artery region ICA aneurysm. 3. Negative neck MRA.  No stenosis or embolic source seen. 4. Mild chronic small vessel ischemia in the cerebral white matter. Electronically Signed   By: Monte Fantasia M.D.   On: 07/13/2017 09:43    Microbiology: No results found for this or any previous visit (from the past 240 hour(s)).   Labs: Basic Metabolic Panel: Recent Labs  Lab 07/11/17 1126 07/12/17 1759 07/13/17 0445  NA 146 142 145  K 4.0 4.4 3.9  CL 106 107 109  CO2 34* 29 28  GLUCOSE 79 94 76  BUN 22 22* 20  CREATININE 1.51* 1.25* 1.15*  CALCIUM 9.3 9.0 8.5*   Liver Function Tests: Recent Labs  Lab 07/12/17 1759 07/13/17 0445  AST 24 17  ALT 12* 10*  ALKPHOS 56 52  BILITOT 0.9 0.8  PROT 6.1* 5.6*  ALBUMIN 3.4* 3.1*   CBC: Recent Labs  Lab 07/12/17 1759 07/13/17 0445  WBC 7.5 6.5  NEUTROABS 4.3  --   HGB 11.2* 10.2*  HCT 36.2 33.9*  MCV 92.3 92.6  PLT 268 232   BNP (last 3 results) Recent Labs    06/02/17 1514  07/12/17 1759  BNP 806.0* 642.0*    Signed:  Barton Dubois MD.  [facility] 07/14/2017, 8:26 AM

## 2017-07-14 NOTE — Progress Notes (Signed)
Patient is to be discharged home and in stable condition. Patient's IV and telemetry removed, WNL. Patient given discharge instructions and verbalized understanding. Patient will be transported out by staff via wheelchair.  Celestia Khat, RN

## 2017-07-14 NOTE — Care Management Note (Signed)
Case Management Note  Patient Details  Name: Brandy [name] MRN: [id number] Date of Birth: 26-Feb-1941  Subjective/Objective:       Admitted with CVA.              Action/Plan: DC home today. Pt has declined referrals for OP PT and OT, agreeable to OP SLP. Would like services in [place]. CM has sent referral to [facility].   Expected Discharge Date:  07/14/17               Expected Discharge Plan:  Home/Self Care  In-House Referral:  NA  Discharge planning Services  CM Consult  Post Acute Care Choice:  NA Choice offered to:  NA  Status of Service:  Completed, signed off  Sherald Barge, RN 07/14/2017, 8:47 AM

## 2017-07-19 ENCOUNTER — Telehealth: Payer: Self-pay

## 2017-07-19 DIAGNOSIS — Z79899 Other long term (current) drug therapy: Secondary | ICD-10-CM

## 2017-07-19 MED ORDER — FUROSEMIDE 20 MG PO TABS: 20.0000 mg | ORAL_TABLET | Freq: Every day | ORAL | 3 refills | Status: DC

## 2017-07-19 MED ORDER — FUROSEMIDE 20 MG PO TABS: 20.0000 mg | ORAL_TABLET | ORAL | 3 refills | Status: DC

## 2017-07-19 NOTE — Telephone Encounter (Signed)
Ailed lab slip, pt will take lasix 20 mg daily

## 2017-07-19 NOTE — Telephone Encounter (Signed)
Pt understands to take lasix 20 mg every other day and repeat bmet in 1 month

## 2017-07-19 NOTE — Telephone Encounter (Signed)
-----   Message from Herminio Commons, MD sent at 07/19/2017  1:11 PM EDT ----- Regarding: RE: lasix dose There has apparently been a dosage change since she was hospitalized since her last office visit with me. No, just have her take it QOD for now and repeat a BMET in a month.  ----- Message ----- From: Bernita Raisin, RN Sent: 07/19/2017  12:52 PM To: Herminio Commons, MD Subject: lasix dose                                     You want pt to take lasix 20 mg daily? She was taking it QOD, I am confused

## 2017-07-19 NOTE — Telephone Encounter (Signed)
-----   Message from Adams sent at 07/13/2017  7:51 AM EDT -----   ----- Message ----- From: Herminio Commons, MD Sent: 07/12/2017   5:21 PM To: Staci T Ashworth, CMA  Creatinine has slightly increased since last blood test on 06/16/17.  If she can tolerate, I would recommend decreasing Lasix to 20 mg daily and repeating a basic metabolic panel 2 weeks afterwards.

## 2017-07-27 ENCOUNTER — Ambulatory Visit (HOSPITAL_COMMUNITY): Payer: Medicare Other | Attending: Internal Medicine | Admitting: Speech Pathology

## 2017-07-27 ENCOUNTER — Encounter (HOSPITAL_COMMUNITY): Payer: Self-pay | Admitting: Speech Pathology

## 2017-07-27 ENCOUNTER — Other Ambulatory Visit: Payer: Self-pay

## 2017-07-27 DIAGNOSIS — R41841 Cognitive communication deficit: Secondary | ICD-10-CM | POA: Insufficient documentation

## 2017-07-27 NOTE — Therapy (Signed)
[place], [place], [address] Phone: [phone]   Fax:  [phone]  Speech Language Pathology Evaluation  Patient Details  Name: Brandy [name] MRN: [id number] Date of Birth: [birth year] Referring Provider: Barton Dubois   Encounter Date: 07/27/2017  End of Session - 07/27/17 1416    Visit Number  1    Number of Visits  4    Date for SLP Re-Evaluation  08/25/17    Authorization Type  UHC Medicare based on medical necessity    SLP Start Time  1034    SLP Stop Time   1119    SLP Time Calculation (min)  45 min    Activity Tolerance  Patient tolerated treatment well       Past Medical History:  Diagnosis Date  . Cardiomyopathy    with  a history of congestive heart failure and an EF of 25% in 5/08; nl EF  on the echo in 2011  . Chronic combined systolic and diastolic CHF (congestive heart failure) ([place])   . Colon polyps   . Diverticulosis of colon   . Hypertension   . Hypertensive heart disease   . Osteopenia   . Tobacco abuse    50 pack year    Past Surgical History:  Procedure Laterality Date  . BREAST BIOPSY    . CARPAL TUNNEL RELEASE     right hand  . CERVICAL FUSION    . CESAREAN SECTION     2 times  . COLONOSCOPY  4315   pt uncertain as to whether polypectomy required or performed  . ECTOPIC PREGNANCY SURGERY    . RIGHT/LEFT HEART CATH AND CORONARY ANGIOGRAPHY N/A 06/03/2017   Procedure: RIGHT/LEFT HEART CATH AND CORONARY ANGIOGRAPHY;  Surgeon: Jettie Booze, MD;  Location: [facility];  Service: Cardiovascular;  Laterality: N/A;  . TONSILLECTOMY      There were no vitals filed for this visit.  Subjective Assessment - 07/27/17 1402    Subjective  "My ability to find the right words is still a little different."    Patient is accompained by:  Family member Spouse    Special Tests  Informal measure and portions of fomal measures    Currently in Pain?  No/denies         SLP  Evaluation [facility] - 07/27/17 1402      SLP Visit Information   SLP Received On  07/27/17    Referring Provider  Barton Dubois    Onset Date  07/12/2017    Medical Diagnosis  s/p CVA      Subjective   Patient/Family Stated Goal  Improve word finding      General Information   HPI  Brandy [name] a 77 y.o.femalewith medical history significant of dilated cardiomyopathy, chronic combined systolic and diastolic congestive heart failure, history of colon polyps and diverticuli, hypertension, hypertensive heart disease, osteopenia, history of tobacco abuse in the past (50-pack-year/quit in 2010) who presented to the ED on 07/12/2017 for evaluation of fall, confusion and slurred speech. MRI showed: Left M2 occlusion with comparatively small acute infarct along the lower left insula and external capsule. Small subacute infarct along the lateral left frontal cortex. 3 mm left superior hypophyseal artery region ICA aneurysm. Negative neck MRA. No stenosis or embolic source seen. Mild chronic small vessel ischemia in the cerebral white matter. Pt was given MoCA in acute setting with a score of 23/30 with recommendation for follow up outpatient SLP services.  She is referred for outpatient SLP by Dr. Barton Dubois.     Behavioral/Cognition  Alert, cooperative, well groomed    Mobility Status  Ambulatory      Balance Screen   Has the patient fallen in the past 6 months  Yes    How many times?  1 when she had the stroke    Has the patient had a decrease in activity level because of a fear of falling?   No    Is the patient reluctant to leave their home because of a fear of falling?   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      Pain Assessment   Pain Assessment  No/denies pain      Cognition    Overall Cognitive Status  Impaired/Different from baseline    Area of Impairment  Memory    Memory  Decreased short-term memory    Memory Comments  8/12 for 4 item recall; recall of 2 independently    Memory  Impaired    Memory Impairment  Storage deficit;Retrieval deficit    Awareness  Appears intact    Problem Solving  Appears intact      Auditory Comprehension   Overall Auditory Comprehension  Appears within functional limits for tasks assessed    Yes/No Questions  Within Functional Limits    Commands  Within Functional Limits    Complex Commands  75-100% accurate    Conversation  Complex      Visual Recognition/Discrimination   Discrimination  Within Function Limits      Reading Comprehension   Reading Status  Within funtional limits      Expression   Primary Mode of Expression  Verbal      Verbal Expression   Overall Verbal Expression  Impaired    Initiation  No impairment    Automatic Speech  Name;Social Response;Day of week    Level of Generative/Spontaneous Verbalization  Conversation    Repetition  No impairment    Naming  Impairment    Responsive  76-100% accurate    Confrontation  75-100% accurate    Convergent  75-100% accurate    Divergent  50-74% accurate 14 animals and 7 /s/ words    Other Naming Comments  Pt reports mild dysnomia in conversation    Pragmatics  No impairment    Non-Verbal Means of Communication  Not applicable      Written Expression   Dominant Hand  Right    Written Expression  Within Functional Limits      Oral Motor/Sensory Function   Overall Oral Motor/Sensory Function  Appears within functional limits for tasks assessed      Motor Speech   Overall Motor Speech  Appears within functional limits for tasks assessed    Respiration  Within functional limits    Phonation  Normal    Resonance  Within functional limits    Articulation  Within functional limitis    Intelligibility  Intelligible    Motor Planning  Witnin functional  limits    Motor Speech Errors  Not applicable    Phonation  WFL      Standardized Assessments   Standardized Assessments   -- BNT  short 15/15        SLP Education - 07/27/17 1415    Education provided  Yes    Education Details  Reviewed plan for short term therapy; provided handouts on word finding  and memory strategies    Person(s) Educated  Patient;Spouse    Methods  Explanation;Handout    Comprehension  Verbalized understanding       SLP Short Term Goals - 07/27/17 1418      SLP SHORT TERM GOAL #1   Title  Implement word finding strategies in structured tasks with min assist.    Baseline  dysnomia noted in complex conversations    Time  4    Period  Weeks    Status  New    Target Date  08/25/17      SLP SHORT TERM GOAL #2   Title  Complete moderate level divergent naming tasks with 90% acc when given min cues.    Baseline  7 /s/ words for 65%    Time  4    Period  Weeks    Status  New    Target Date  08/25/17      SLP SHORT TERM GOAL #3   Title  Provide verbal description of object using at least 3 descriptives (function, size/shape, location) when given min cues.    Baseline  Introduced    Time  4    Period  Weeks    Status  New    Target Date  08/25/17      SLP SHORT TERM GOAL #4   Title  Pt will completed functional memory activities with 100% acc with use of memory strategies and min cues.     Baseline  75%    Time  4    Period  Weeks    Status  New    Target Date  08/25/17       SLP Long Term Goals - 07/27/17 1422      SLP LONG TERM GOAL #1   Title  same as short       Plan - 07/27/17 1417    Clinical Impression Statement Pt presents with mild cognitive communication deficits characterized by word finding difficulties in conversation, impaired divergent naming, reduced thought organization in conversation, and mild working memory deficits. 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 She lives at  home with her husband and was independent in all cognitive communication domains prior to this stroke. Pt will benefit from skilled SLP in order to address the above impairments, maximize independence, and decrease burden of care. Pt is motivated to address her cognitive communication needs and her family appears supportive.    Speech Therapy Frequency  1x /week    Duration  4 weeks    Treatment/Interventions  SLP instruction and feedback;Cognitive reorganization;Compensatory strategies;Compensatory techniques;Patient/family education;Language facilitation;Multimodal communcation approach    Potential to Achieve Goals  Good    SLP Home Exercise Plan  Complete HEP as assigned to facilitate carry over of treatment techniques and strategies to home/community environment.    Consulted and Agree with Plan of Care  Patient       Patient will benefit from skilled therapeutic intervention in order to improve the following deficits and impairments:   Cognitive communication deficit    Problem List Patient Active Problem List   Diagnosis Date Noted  . Acute ischemic stroke ([place])   . Seasonal allergies 07/13/2017  . TIA (  transient ischemic attack) 07/12/2017  . Anemia 07/12/2017  . Pneumonia 06/02/2017  . Pleural effusion 06/02/2017  . Acute on chronic systolic congestive heart failure ([place])   . Elevated troponin   . Other fatigue 05/04/2017  . Dyspnea 05/04/2017  . Hypertensive heart disease   . Chronic combined systolic and diastolic CHF (congestive heart failure) ([place])   . Hypertension   . HTN (hypertension) 01/22/2013  . TOBACCO ABUSE 02/18/2008  . CARDIOMYOPATHY 02/18/2008  . DIVERTICULOSIS OF COLON 02/18/2008  . OSTEOPENIA 02/18/2008   Thank you,  Genene Churn, [place]  Genene Churn 07/27/2017, 2:22 PM  [place], [place], [address] Phone: [phone]   Fax:  [phone]  Name: Brandy [name] MRN: [id number] Date of Birth: 08/26/1940

## 2017-08-04 ENCOUNTER — Encounter (HOSPITAL_COMMUNITY): Payer: Self-pay | Admitting: Speech Pathology

## 2017-08-04 ENCOUNTER — Ambulatory Visit (HOSPITAL_COMMUNITY): Payer: Medicare Other | Admitting: Speech Pathology

## 2017-08-04 DIAGNOSIS — R41841 Cognitive communication deficit: Secondary | ICD-10-CM

## 2017-08-04 LAB — BASIC METABOLIC PANEL
BUN / CREAT RATIO: 11 (calc) (ref 6–22)
BUN: 14 mg/dL (ref 7–25)
CALCIUM: 9.5 mg/dL (ref 8.6–10.4)
CO2: 32 mmol/L (ref 20–32)
Chloride: 107 mmol/L (ref 98–110)
Creat: 1.27 mg/dL — ABNORMAL HIGH (ref 0.60–0.93)
Glucose, Bld: 78 mg/dL (ref 65–139)
POTASSIUM: 4.1 mmol/L (ref 3.5–5.3)
Sodium: 144 mmol/L (ref 135–146)

## 2017-08-04 NOTE — Therapy (Signed)
[facility] [address] [place], [place], [address] Phone: [phone]   Fax:  [phone]  Speech Language Pathology Treatment  Patient Details  Name: Brandy [name] MRN: [id number] Date of Birth: 1940/12/31 Referring Provider: Barton Dubois   Encounter Date: 08/04/2017  End of Session - 08/04/17 1615    Visit Number  2    Number of Visits  4    Date for SLP Re-Evaluation  08/25/17    Authorization Type  UHC Medicare based on medical necessity    SLP Start Time  1515    SLP Stop Time   1600    SLP Time Calculation (min)  45 min    Activity Tolerance  Patient tolerated treatment well       Past Medical History:  Diagnosis Date  . Cardiomyopathy    with  a history of congestive heart failure and an EF of 25% in 5/08; nl EF  on the echo in 2011  . Chronic combined systolic and diastolic CHF (congestive heart failure) ([place])   . Colon polyps   . Diverticulosis of colon   . Hypertension   . Hypertensive heart disease   . Osteopenia   . Tobacco abuse    50 pack year    Past Surgical History:  Procedure Laterality Date  . BREAST BIOPSY    . CARPAL TUNNEL RELEASE     right hand  . CERVICAL FUSION    . CESAREAN SECTION     2 times  . COLONOSCOPY  6962   pt uncertain as to whether polypectomy required or performed  . ECTOPIC PREGNANCY SURGERY    . RIGHT/LEFT HEART CATH AND CORONARY ANGIOGRAPHY N/A 06/03/2017   Procedure: RIGHT/LEFT HEART CATH AND CORONARY ANGIOGRAPHY;  Surgeon: Jettie Booze, MD;  Location: [facility];  Service: Cardiovascular;  Laterality: N/A;  . TONSILLECTOMY      There were no vitals filed for this visit.  Subjective Assessment - 08/04/17 1610    Subjective  "I have been stuttering since the last time I saw you."    Patient is accompained by:  Family member    Currently in Pain?  No/denies       ADULT SLP TREATMENT - 08/04/17 1610      General Information   Behavior/Cognition   Alert;Cooperative;Pleasant mood    Patient Positioning  Upright in chair    Oral care provided  N/A    HPI  Brandy [name] is a 77 y.o. female with medical history significant of dilated cardiomyopathy, chronic combined systolic and diastolic congestive heart failure, history of colon polyps and diverticuli, hypertension, hypertensive heart disease, osteopenia, history of tobacco abuse in the past (50-pack-year/quit in 2010) who presented to the ED on 07/12/2017 for evaluation of fall, confusion and slurred speech. MRI showed: Left M2 occlusion with comparatively small acute infarct along the lower left insula and external capsule. Small subacute infarct along the lateral left frontal cortex. 3 mm left superior hypophyseal artery region ICA aneurysm. Negative neck MRA.  No stenosis or embolic source seen. Mild chronic small vessel ischemia in the cerebral white matter. Pt was given MoCA in acute setting with a score of 23/30 with recommendation for follow up outpatient SLP services. She is referred for outpatient SLP by Dr. Barton Dubois.        Treatment Provided   Treatment provided  Cognitive-Linquistic      Pain Assessment   Pain Assessment  No/denies pain  Cognitive-Linquistic Treatment   Treatment focused on  Aphasia;Cognition;Patient/family/caregiver education    Skilled Treatment  Skilled SLP treatment focused on targeting word finding and fluency in picture description tasks, conversation, and when providing verbal summary of short paragraphs.      Assessment / Recommendations / Plan   Plan  Continue with current plan of care         SLP Short Term Goals - 08/04/17 1700      SLP SHORT TERM GOAL #1   Title  Implement word finding strategies in structured tasks with min assist.    Baseline  dysnomia noted in complex conversations    Time  4    Period  Weeks    Status  On-going      SLP SHORT TERM GOAL #2   Title  Complete moderate level divergent naming tasks with 90% acc  when given min cues.    Baseline  7 /s/ words for 65%    Time  4    Period  Weeks    Status  On-going      SLP SHORT TERM GOAL #3   Title  Provide verbal description of object using at least 3 descriptives (function, size/shape, location) when given min cues.    Baseline  Introduced    Time  4    Period  Weeks    Status  On-going      SLP SHORT TERM GOAL #4   Title  Pt will completed functional memory activities with 100% acc with use of memory strategies and min cues.     Baseline  75%    Time  4    Period  Weeks    Status  On-going       SLP Long Term Goals - 07/27/17 1422      SLP LONG TERM GOAL #1   Title  same as short       Plan - 08/04/17 1641    Clinical Impression Statement  Pt reports some stuttering since the evaluation last week, which she did not notice before. Her husband also confirms this. Neither are able to identify situations which seem to trigger or exacerbate the stuttering. Pt was able to complete 10 item recall task with 8/10 on trial 1 and 10/10 on trial 2. She was then able to name 8 words without cues. Pt read a short paragraph and provided a verbal summary with 95% fluency and 100% for summary content. During word description task, she provided three verbal descriptors for a pictured word with 100% accuracy. This task was also shaped into a timed task, which Pt voiced feeling comfortable after completion. Pt encouraged to keep track of situations when she notices "stuttering" as she was not able to identify when/why it happens. Pt reports that she is typically well spoken and she notices hesitations in her speech at this time. Continue to target complex verbal expression in conversation.   Speech Therapy Frequency  1x /week    Duration  4 weeks    Treatment/Interventions  SLP instruction and feedback;Cognitive reorganization;Compensatory strategies;Compensatory techniques;Patient/family education;Language facilitation;Multimodal communcation approach     Potential to Achieve Goals  Good    SLP Home Exercise Plan  Complete HEP as assigned to facilitate carry over of treatment techniques and strategies to home/community environment.    Consulted and Agree with Plan of Care  Patient       Patient will benefit from skilled therapeutic intervention in order to improve the following deficits and impairments:  Cognitive communication deficit    Problem List Patient Active Problem List   Diagnosis Date Noted  . Acute ischemic stroke ([place])   . Seasonal allergies 07/13/2017  . TIA (transient ischemic attack) 07/12/2017  . Anemia 07/12/2017  . Pneumonia 06/02/2017  . Pleural effusion 06/02/2017  . Acute on chronic systolic congestive heart failure ([place])   . Elevated troponin   . Other fatigue 05/04/2017  . Dyspnea 05/04/2017  . Hypertensive heart disease   . Chronic combined systolic and diastolic CHF (congestive heart failure) ([place])   . Hypertension   . HTN (hypertension) 01/22/2013  . TOBACCO ABUSE 02/18/2008  . CARDIOMYOPATHY 02/18/2008  . DIVERTICULOSIS OF COLON 02/18/2008  . OSTEOPENIA 02/18/2008   Thank you,  Genene Churn, [place]  [place] 08/04/2017, 5:07 PM  [place], [place], [address] Phone: [phone]   Fax:  [phone]   Name: Brandy [name] MRN: [id number] Date of Birth: 1941-02-18

## 2017-08-11 ENCOUNTER — Ambulatory Visit (HOSPITAL_COMMUNITY): Payer: Medicare Other | Admitting: Speech Pathology

## 2017-08-11 ENCOUNTER — Encounter (HOSPITAL_COMMUNITY): Payer: Self-pay | Admitting: Speech Pathology

## 2017-08-11 ENCOUNTER — Other Ambulatory Visit: Payer: Self-pay

## 2017-08-11 DIAGNOSIS — R41841 Cognitive communication deficit: Secondary | ICD-10-CM | POA: Diagnosis not present

## 2017-08-11 NOTE — Therapy (Signed)
Brandy [name] [address] [place], [place], [address] Phone: [phone]   Fax:  [phone]  Speech Language Pathology Treatment  Patient Details  Name: Brandy [name] MRN: [id number] Date of Birth: 1940/08/09 Referring Provider: Barton Dubois   Encounter Date: 08/11/2017  End of Session - 08/11/17 1639    Visit Number  3    Number of Visits  4    Date for SLP Re-Evaluation  08/25/17    Authorization Type  UHC Medicare based on medical necessity    SLP Start Time  1600    SLP Stop Time   1645    SLP Time Calculation (min)  45 min    Activity Tolerance  Patient tolerated treatment well       Past Medical History:  Diagnosis Date  . Cardiomyopathy    with  a history of congestive heart failure and an EF of 25% in 5/08; nl EF  on the echo in 2011  . Chronic combined systolic and diastolic CHF (congestive heart failure) ([place])   . Colon polyps   . Diverticulosis of colon   . Hypertension   . Hypertensive heart disease   . Osteopenia   . Tobacco abuse    50 pack year    Past Surgical History:  Procedure Laterality Date  . BREAST BIOPSY    . CARPAL TUNNEL RELEASE     right hand  . CERVICAL FUSION    . CESAREAN SECTION     2 times  . COLONOSCOPY  6203   pt uncertain as to whether polypectomy required or performed  . ECTOPIC PREGNANCY SURGERY    . RIGHT/LEFT HEART CATH AND CORONARY ANGIOGRAPHY N/A 06/03/2017   Procedure: RIGHT/LEFT HEART CATH AND CORONARY ANGIOGRAPHY;  Surgeon: Jettie Booze, MD;  Location: [facility];  Service: Cardiovascular;  Laterality: N/A;  . TONSILLECTOMY      There were no vitals filed for this visit.  Subjective Assessment - 08/11/17 1650    Subjective  "I have been a little short of breath."    Patient is accompained by:  Family member    Currently in Pain?  No/denies            ADULT SLP TREATMENT - 08/11/17 1651      General Information   Behavior/Cognition   Alert;Cooperative;Pleasant mood    Patient Positioning  Upright in chair    Oral care provided  N/A    HPI  Brandy [name] is a 77 y.o. female with medical history significant of dilated cardiomyopathy, chronic combined systolic and diastolic congestive heart failure, history of colon polyps and diverticuli, hypertension, hypertensive heart disease, osteopenia, history of tobacco abuse in the past (50-pack-year/quit in 2010) who presented to the ED on 07/12/2017 for evaluation of fall, confusion and slurred speech. MRI showed: Left M2 occlusion with comparatively small acute infarct along the lower left insula and external capsule. Small subacute infarct along the lateral left frontal cortex. 3 mm left superior hypophyseal artery region ICA aneurysm. Negative neck MRA.  No stenosis or embolic source seen. Mild chronic small vessel ischemia in the cerebral white matter. Pt was given MoCA in acute setting with a score of 23/30 with recommendation for follow up outpatient SLP services. She is referred for outpatient SLP by Dr. Barton Dubois.        Treatment Provided   Treatment provided  Cognitive-Linquistic      Pain Assessment   Pain Assessment  No/denies pain  Cognitive-Linquistic Treatment   Treatment focused on  Aphasia;Cognition;Patient/family/caregiver education    Skilled Treatment  Skilled SLP treatment focused on targeting word finding and fluency in picture description tasks, conversation, and when providing verbal summary of short paragraphs.      Assessment / Recommendations / Plan   Plan  Continue with current plan of care       SLP Education - 08/11/17 1656    Education provided  Yes    Education Details  Discussed fluency enhancing strategies, easy onset    Person(s) Educated  Patient;Spouse    Methods  Explanation;Handout    Comprehension  Verbalized understanding       SLP Short Term Goals - 08/11/17 1630      SLP SHORT TERM GOAL #1   Title  Implement word finding  strategies in structured tasks with min assist.    Baseline  dysnomia noted in complex conversations    Time  4    Period  Weeks    Status  On-going      SLP SHORT TERM GOAL #2   Title  Complete moderate level divergent naming tasks with 90% acc when given min cues.    Baseline  7 /s/ words for 65%    Time  4    Period  Weeks    Status  On-going      SLP SHORT TERM GOAL #3   Title  Provide verbal description of object using at least 3 descriptives (function, size/shape, location) when given min cues.    Baseline  Introduced    Time  4    Period  Weeks    Status  On-going      SLP SHORT TERM GOAL #4   Title  Pt will completed functional memory activities with 100% acc with use of memory strategies and min cues.     Baseline  75%    Time  4    Period  Weeks    Status  On-going       SLP Long Term Goals - 07/27/17 1422      SLP LONG TERM GOAL #1   Title  same as short       Plan - 08/11/17 1703    Clinical Impression Statement Pt accompanied to therapy by her husband. She appeared to be sniffling and slightly short of breath (this has been the case since her first visit with me on Jul 27, 2017). Pt was admitted to [facility] in mid March with CHF. I encouraged her to contact her PCP and/or cardiologist if SOB continues or worsens. SLP observed her with thin liquids and Pt does not exhibit signs of aspiration. Session focused on targeting high level word retrieval via sentence creation with synonym and antonym tasks. Pt completed with 100% acc with indirect cueing. During picture description tasks, content was noted to be 100% accurate and fluency with 100%. Pt had one episode of dysfluency in the beginning of the session and SLP encouraged Pt to utilize easy onset, light articulation touches, and breathing strategies when this occurs out of session. Will likely discharge from SLP services next session.    Speech Therapy Frequency  1x /week    Duration  4 weeks     Treatment/Interventions  SLP instruction and feedback;Cognitive reorganization;Compensatory strategies;Compensatory techniques;Patient/family education;Language facilitation;Multimodal communcation approach    Potential to Achieve Goals  Good    SLP Home Exercise Plan  Complete HEP as assigned to facilitate carry over of treatment techniques and strategies  to home/community environment.    Consulted and Agree with Plan of Care  Patient       Patient will benefit from skilled therapeutic intervention in order to improve the following deficits and impairments:   Cognitive communication deficit    Problem List Patient Active Problem List   Diagnosis Date Noted  . Acute ischemic stroke ([place])   . Seasonal allergies 07/13/2017  . TIA (transient ischemic attack) 07/12/2017  . Anemia 07/12/2017  . Pneumonia 06/02/2017  . Pleural effusion 06/02/2017  . Acute on chronic systolic congestive heart failure ([place])   . Elevated troponin   . Other fatigue 05/04/2017  . Dyspnea 05/04/2017  . Hypertensive heart disease   . Chronic combined systolic and diastolic CHF (congestive heart failure) ([place])   . Hypertension   . HTN (hypertension) 01/22/2013  . TOBACCO ABUSE 02/18/2008  . CARDIOMYOPATHY 02/18/2008  . DIVERTICULOSIS OF COLON 02/18/2008  . OSTEOPENIA 02/18/2008   Thank you,  Genene Churn, [place]  [place] 08/11/2017, 5:08 PM  [place], [place], [address] Phone: [phone]   Fax:  [phone]   Name: Norah Devin MRN: [id number] Date of Birth: [birth year]

## 2017-08-18 ENCOUNTER — Telehealth (HOSPITAL_COMMUNITY): Payer: Self-pay | Admitting: Family Medicine

## 2017-08-18 ENCOUNTER — Ambulatory Visit (HOSPITAL_COMMUNITY): Payer: Medicare Other | Admitting: Speech Pathology

## 2017-08-18 NOTE — Telephone Encounter (Signed)
08/18/17  Pt called to cx and we rescheduled for the following week

## 2017-08-24 ENCOUNTER — Encounter: Payer: Self-pay | Admitting: Cardiovascular Disease

## 2017-08-24 ENCOUNTER — Ambulatory Visit: Payer: Medicare Other | Admitting: Cardiovascular Disease

## 2017-08-24 VITALS — BP 128/72 | HR 80 | Ht 64.5 in | Wt 167.0 lb

## 2017-08-24 DIAGNOSIS — N183 Chronic kidney disease, stage 3 unspecified: Secondary | ICD-10-CM

## 2017-08-24 DIAGNOSIS — I10 Essential (primary) hypertension: Secondary | ICD-10-CM | POA: Diagnosis not present

## 2017-08-24 DIAGNOSIS — R000 Tachycardia, unspecified: Secondary | ICD-10-CM | POA: Diagnosis not present

## 2017-08-24 DIAGNOSIS — I110 Hypertensive heart disease with heart failure: Secondary | ICD-10-CM | POA: Diagnosis not present

## 2017-08-24 DIAGNOSIS — I428 Other cardiomyopathies: Secondary | ICD-10-CM | POA: Diagnosis not present

## 2017-08-24 DIAGNOSIS — R0602 Shortness of breath: Secondary | ICD-10-CM

## 2017-08-24 DIAGNOSIS — I5042 Chronic combined systolic (congestive) and diastolic (congestive) heart failure: Secondary | ICD-10-CM

## 2017-08-24 DIAGNOSIS — Z720 Tobacco use: Secondary | ICD-10-CM | POA: Diagnosis not present

## 2017-08-24 NOTE — Progress Notes (Signed)
SUBJECTIVE: The patient presents for posthospitalization follow-up.  She was hospitalized in April 2019 with an acute ischemic stroke.  I personally reviewed the MRI performed on 07/13/2017 which showed left M2 occlusion with comparatively small acute infarct along the lower left insula and external capsule.  There was a small subacute infarct along the left lateral frontal cortex.  Most recent echocardiogram performed on 07/13/2017 showed moderately reduced left ventricular systolic function, LVEF 75%, diffuse hypokinesis, mild to moderate mitral regurgitation, and moderate left atrial dilatation.  Coronary angiography on 06/03/17 demonstrated mild nonobstructive disease with mildly elevated right heart pressures with volume overload.  There was a mid RCA 25% stenosis and a 25% stenosis of the ostium of the second diagonal branch.  She has been having shortness of breath over the past several weeks.  It can occur while she is playing piano.  She quit smoking cigarettes about 5 years ago but has been vaping.  She had been smoking a pack of cigarettes daily for over 30 years.  She has taken an extra dose of Lasix for increased shortness of breath and weight gain about twice since her last visit with me.  She denies chest pain.  When she uses a humidifier it helps her breathing.    Soc Hx: Married. 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 Her brother is a retired Doctor, hospital in [place], [place] ([place]). 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  Her husband did his masters in Cabin crew at the [employer] and worked as a principal in [place].    Review of Systems: As per "subjective", otherwise negative.  Allergies  Allergen Reactions  . Lisinopril Swelling    Current Outpatient Medications  Medication Sig Dispense Refill  . aspirin EC 81 MG EC tablet Take 2 tablets (162 mg total) by mouth daily. 30  tablet 1  . atorvastatin (LIPITOR) 10 MG tablet Take 1 tablet (10 mg total) by mouth daily at 6 PM. 30 tablet 1  . carvedilol (COREG) 12.5 MG tablet Take 1 tablet (12.5 mg total) by mouth 2 (two) times daily with a meal. 60 tablet 0  . cetirizine (ZYRTEC) 10 MG tablet Take 10 mg by mouth daily as needed for allergies.     . cyanocobalamin (,VITAMIN B-12,) 1000 MCG/ML injection Daily for 1 week, then weekly for 1 month and then monthly (for 8 months) 25 mL 0  . furosemide (LASIX) 20 MG tablet Take 1 tablet (20 mg total) by mouth every other day. 45 tablet 3  . isosorbide-hydrALAZINE (BIDIL) 20-37.5 MG tablet Take 1 tablet by mouth 3 (three) times daily. 90 tablet 1  . losartan (COZAAR) 25 MG tablet Take 25 mg by mouth daily.   3  . potassium chloride SA (K-DUR,KLOR-CON) 20 MEQ tablet Take 1 tablet (20 mEq total) by mouth daily.    . predniSONE (DELTASONE) 5 MG tablet Take 5 mg by mouth daily as needed (for swelling and right knee pain).     No current facility-administered medications for this visit.     Past Medical History:  Diagnosis Date  . Cardiomyopathy    with  a history of congestive heart failure and an EF of 25% in 5/08; nl EF  on the echo in 2011  . Chronic combined systolic and diastolic CHF (congestive heart failure) ([facility])   . Colon polyps   . Diverticulosis of colon   . Hypertension   . Hypertensive heart disease   .  Osteopenia   . Tobacco abuse    50 pack year    Past Surgical History:  Procedure Laterality Date  . BREAST BIOPSY    . CARPAL TUNNEL RELEASE     right hand  . CERVICAL FUSION    . CESAREAN SECTION     2 times  . COLONOSCOPY  1517   pt uncertain as to whether polypectomy required or performed  . ECTOPIC PREGNANCY SURGERY    . RIGHT/LEFT HEART CATH AND CORONARY ANGIOGRAPHY N/A 06/03/2017   Procedure: RIGHT/LEFT HEART CATH AND CORONARY ANGIOGRAPHY;  Surgeon: Jettie Booze, MD;  Location: [facility];  Service: Cardiovascular;  Laterality:  N/A;  . TONSILLECTOMY      Social History   Socioeconomic History  . Marital status: Married    Spouse name: Not on file  . Number of children: Not on file  . Years of education: Not on file  . Highest education level: Not on file  Occupational History  . Occupation: Former Product manager: RETIRED  Social Needs  . Financial resource strain: Not on file  . Food insecurity:    Worry: Not on file    Inability: Not on file  . Transportation needs:    Medical: Not on file    Non-medical: Not on file  Tobacco Use  . Smoking status: Former Smoker    Packs/day: 1.00    Years: 50.00    Pack years: 50.00    Types: Cigarettes    Start date: 09/02/1960    Last attempt to quit: 03/29/2008    Years since quitting: 9.4  . Smokeless tobacco: Never Used  Substance and Sexual Activity  . Alcohol use: No    Alcohol/week: 0.0 oz  . Drug use: Never  . Sexual activity: Not Currently  Lifestyle  . Physical activity:    Days per week: Not on file    Minutes per session: Not on file  . Stress: Not on file  Relationships  . Social connections:    Talks on phone: Not on file    Gets together: Not on file    Attends religious service: Not on file    Active member of club or organization: Not on file    Attends meetings of clubs or organizations: Not on file    Relationship status: Not on file  . Intimate partner violence:    Fear of current or ex partner: Not on file    Emotionally abused: Not on file    Physically abused: Not on file    Forced sexual activity: Not on file  Other Topics Concern  . Not on file  Social History Narrative   Married with 2 adult children     Vitals:   08/24/17 0917  BP: 128/72  Pulse: 80  SpO2: 96%  Weight: 167 lb (75.8 kg)  Height: 5' 4.5" (1.638 m)    Wt Readings from Last 3 Encounters:  08/24/17 167 lb (75.8 kg)  07/13/17 172 lb 2.9 oz (78.1 kg)  06/16/17 169 lb (76.7 kg)     PHYSICAL EXAM General: NAD HEENT: Normal. Neck: No JVD,  no thyromegaly. Lungs: Clear to auscultation bilaterally with normal respiratory effort. CV: Regular rate and rhythm, normal S1/S2, no S3/S4, no murmur. No pretibial or periankle edema.    Abdomen: Soft, nontender, no distention.  Neurologic: Alert and oriented.  Psych: Normal affect. Skin: Normal. Musculoskeletal: No gross deformities.    ECG: Most recent ECG reviewed.  Labs: Lab Results  Component Value Date/Time   K 4.1 08/04/2017 12:59 PM   BUN 14 08/04/2017 12:59 PM   CREATININE 1.27 (H) 08/04/2017 12:59 PM   ALT 10 (L) 07/13/2017 04:45 AM   TSH 0.910 06/05/2017 02:33 PM   HGB 10.2 (L) 07/13/2017 04:45 AM     Lipids: Lab Results  Component Value Date/Time   LDLCALC 85 07/13/2017 04:45 AM   CHOL 136 07/13/2017 04:45 AM   TRIG 52 07/13/2017 04:45 AM   HDL 41 07/13/2017 04:45 AM       ASSESSMENT AND PLAN:  1.  Chronic combined systolic and diastolic heart failure with restrictive diastolic physiology due to a nonischemic cardiomyopathy/hypertensive heart disease: Symptomatically stable.  LVEF 40% in April 2019. Continue carvedilol 12.5 mg twice daily along with BiDil 20-37.5 mg 3 times daily and Lasix 20 mg QOD.  She takes supplemental potassium 20 meq daily. I previously instructed her to weigh herself daily and should she gain 3 pounds in 24 hours or 5 pounds in a week, to take an extra 40 mg of Lasix and to double her potassium dose.  2.  Sinus tachycardia: Heart rate is controlled on carvedilol.  No changes.  3.  Chronic kidney disease stage III: BUN 14, creatinine 1.27 on 08/04/2017.  4.  Chronic hypertension: Blood pressure is controlled.  Unclear why she is on losartan.  This will be discontinued.  5.  Recent CVA: Currently on aspirin and statin therapy.  6.  Shortness of breath: She has a long history of tobacco abuse and may have emphysema.  She will require pulmonary function testing.  I will make a pulmonary referral (Dr. Lake Bells).   Disposition:  Follow up 4 months   Kate Sable, M.D., F.A.C.C.

## 2017-08-24 NOTE — Patient Instructions (Addendum)
Your physician wants you to follow-up in:4 months with Dr.Koneswaran     STOP Losartan   All other medications stay the same.    You have been referred to pulmonary Dr.[name] in [place], they will call you to schedule an apt    No lab work or tests ordered today.      Thank you for choosing [facility] !

## 2017-08-25 ENCOUNTER — Encounter (HOSPITAL_COMMUNITY): Payer: Self-pay | Admitting: Speech Pathology

## 2017-08-25 ENCOUNTER — Ambulatory Visit (HOSPITAL_COMMUNITY): Payer: Medicare Other | Admitting: Speech Pathology

## 2017-08-25 ENCOUNTER — Other Ambulatory Visit: Payer: Self-pay | Admitting: Cardiovascular Disease

## 2017-08-25 DIAGNOSIS — R41841 Cognitive communication deficit: Secondary | ICD-10-CM

## 2017-08-25 MED ORDER — CARVEDILOL 12.5 MG PO TABS: 12.5000 mg | ORAL_TABLET | Freq: Two times a day (BID) | ORAL | 6 refills | Status: DC

## 2017-08-25 NOTE — Telephone Encounter (Signed)
Refill sent.

## 2017-08-25 NOTE — Telephone Encounter (Signed)
Pt is needing a refill for her carvedilol (COREG) 12.5 MG tablet [[id number]]  Sent to [facility] on [address]

## 2017-08-25 NOTE — Therapy (Signed)
[facility] [place], [place], [address] Phone: [phone]   Fax:  [phone]  Speech Language Pathology Treatment  Patient Details  Name: Brandy [name] MRN: [id number] Date of Birth: May 24, 1940 Referring Provider: Barton Dubois   Encounter Date: 08/25/2017  End of Session - 08/25/17 2230    Visit Number  4    Number of Visits  7    Date for SLP Re-Evaluation  09/15/17    Authorization Type  UHC Medicare based on medical necessity    SLP Start Time  1525    SLP Stop Time   1607    SLP Time Calculation (min)  42 min    Activity Tolerance  Patient tolerated treatment well       Past Medical History:  Diagnosis Date  . Cardiomyopathy    with  a history of congestive heart failure and an EF of 25% in 5/08; nl EF  on the echo in 2011  . Chronic combined systolic and diastolic CHF (congestive heart failure) ([facility])   . Colon polyps   . Diverticulosis of colon   . Hypertension   . Hypertensive heart disease   . Osteopenia   . Tobacco abuse    50 pack year    Past Surgical History:  Procedure Laterality Date  . BREAST BIOPSY    . CARPAL TUNNEL RELEASE     right hand  . CERVICAL FUSION    . CESAREAN SECTION     2 times  . COLONOSCOPY  2637   pt uncertain as to whether polypectomy required or performed  . ECTOPIC PREGNANCY SURGERY    . RIGHT/LEFT HEART CATH AND CORONARY ANGIOGRAPHY N/A 06/03/2017   Procedure: RIGHT/LEFT HEART CATH AND CORONARY ANGIOGRAPHY;  Surgeon: Jettie Booze, MD;  Location: [facility];  Service: Cardiovascular;  Laterality: N/A;  . TONSILLECTOMY      There were no vitals filed for this visit.  Subjective Assessment - 08/25/17 2227    Subjective  "I saw my cardiologist."    Patient is accompained by:  Family member    Currently in Pain?  No/denies       ADULT SLP TREATMENT - 08/25/17 0001      General Information   Behavior/Cognition  Alert;Cooperative;Pleasant mood     Patient Positioning  Upright in chair    Oral care provided  N/A    HPI  Brandy [name] is a 77 y.o. female with medical history significant of dilated cardiomyopathy, chronic combined systolic and diastolic congestive heart failure, history of colon polyps and diverticuli, hypertension, hypertensive heart disease, osteopenia, history of tobacco abuse in the past (50-pack-year/quit in 2010) who presented to the ED on 07/12/2017 for evaluation of fall, confusion and slurred speech. MRI showed: Left M2 occlusion with comparatively small acute infarct along the lower left insula and external capsule. Small subacute infarct along the lateral left frontal cortex. 3 mm left superior hypophyseal artery region ICA aneurysm. Negative neck MRA.  No stenosis or embolic source seen. Mild chronic small vessel ischemia in the cerebral white matter. Pt was given MoCA in acute setting with a score of 23/30 with recommendation for follow up outpatient SLP services. She is referred for outpatient SLP by Dr. Barton Dubois.        Treatment Provided   Treatment provided  Cognitive-Linquistic      Pain Assessment   Pain Assessment  No/denies pain      Cognitive-Linquistic Treatment   Treatment  focused on  Aphasia;Cognition;Patient/family/caregiver education    Skilled Treatment  Skilled SLP treatment focused on targeting fluency enhancing strategies and word finding strategies in conversation      Assessment / Recommendations / [place] with current plan of care      Progression Toward Goals   Progression toward goals  Progressing toward goals       SLP Education - 08/25/17 2229    Education provided  Yes    Education Details  Provided list of fluency enhancing strategies    Person(s) Educated  Patient    Methods  Explanation;Handout    Comprehension  Verbalized understanding;Need further instruction       SLP Short Term Goals - 08/25/17 2231      SLP SHORT TERM GOAL #1   Title  Implement  word finding strategies in structured tasks with min assist.    Baseline  dysnomia noted in complex conversations    Time  4    Period  Weeks    Status  Achieved      SLP SHORT TERM GOAL #2   Title  Complete moderate level divergent naming tasks with 90% acc when given min cues.    Baseline  7 /s/ words for 65%    Time  4    Period  Weeks    Status  Partially Met      SLP SHORT TERM GOAL #3   Title  Provide verbal description of object using at least 3 descriptives (function, size/shape, location) when given min cues.    Baseline  Introduced    Time  4    Period  Weeks    Status  Achieved      SLP SHORT TERM GOAL #4   Title  Pt will completed functional memory activities with 100% acc with use of memory strategies and min cues.     Baseline  75%    Time  4    Period  Weeks    Status  Partially Met      SLP SHORT TERM GOAL #5   Title  Pt will implement fluency enhancing strategies during conversational speech with min cues.    Baseline  mod cues    Time  3    Period  Weeks    Status  New       SLP Long Term Goals - 07/27/17 1422      SLP LONG TERM GOAL #1   Title  same as short       Plan - 08/25/17 2231    Clinical Impression Statement  Pt accompanied to therapy by her husband. Session focused on targeting high level word retrieval via sentence creation with synonym and antonym tasks. Pt completed with 100% acc with indirect cueing. During picture description tasks, content was noted to be 100% accurate and fluency with 100%. Pt noted to have more dysfluencies in the beginning of the session today. SLP encouraged Pt to utilize easy onset, light articulation touches, and breathing strategies when this occurs out of session. Pt verbally expressed a strong desire to continue with additional SLP therapy sessions to address periodic dysfluency.   Speech Therapy Frequency  1x /week    Duration  4 weeks    Treatment/Interventions  SLP instruction and feedback;Cognitive  reorganization;Compensatory strategies;Compensatory techniques;Patient/family education;Language facilitation;Multimodal communcation approach    Potential to Achieve Goals  Good    SLP Home Exercise Plan  Complete HEP as assigned to facilitate carry over of  treatment techniques and strategies to home/community environment.    Consulted and Agree with Plan of Care  Patient       Patient will benefit from skilled therapeutic intervention in order to improve the following deficits and impairments:   Cognitive communication deficit    Problem List Patient Active Problem List   Diagnosis Date Noted  . Acute ischemic stroke ([facility])   . Seasonal allergies 07/13/2017  . TIA (transient ischemic attack) 07/12/2017  . Anemia 07/12/2017  . Pneumonia 06/02/2017  . Pleural effusion 06/02/2017  . Acute on chronic systolic congestive heart failure ([facility])   . Elevated troponin   . Other fatigue 05/04/2017  . Dyspnea 05/04/2017  . Hypertensive heart disease   . Chronic combined systolic and diastolic CHF (congestive heart failure) ([facility])   . Hypertension   . HTN (hypertension) 01/22/2013  . TOBACCO ABUSE 02/18/2008  . CARDIOMYOPATHY 02/18/2008  . DIVERTICULOSIS OF COLON 02/18/2008  . OSTEOPENIA 02/18/2008   Speech Therapy Progress Note  Dates of Reporting Period: 07/27/17 to 08/25/17  Objective Reports of Subjective Statement: See above  Objective Measurements: See above  Goal Update: See above- dysfluency goal added  Plan: 3 more visits to be used through 09/22/17  Reason Skilled Services are Required: Pt has made excellent progress toward goals, however since the evaluation, she has noted increased dysfluencies in conversation. Pt is requesting additional therapy due to frustration with verbal dysfluencies. Pt will benefit from continued skilled SLP services to target this new goal.  Thank you,  Genene Churn, [place]  [facility] 08/25/2017, 10:34 PM  [place], [place], [address] Phone: [phone]   Fax:  [phone]   Name: Brandy [name] MRN: [id number] Date of Birth: 1941/03/15

## 2017-09-01 ENCOUNTER — Telehealth (HOSPITAL_COMMUNITY): Payer: Self-pay | Admitting: Speech Pathology

## 2017-09-01 ENCOUNTER — Ambulatory Visit (HOSPITAL_COMMUNITY): Payer: Medicare Other | Admitting: Speech Pathology

## 2017-09-01 NOTE — Telephone Encounter (Signed)
[name] husband know about apptemnt change due to DP's vacation - will print new schedule on next visit 6/13. NF

## 2017-09-01 NOTE — Telephone Encounter (Signed)
Husband call to cx -patient is not feeling well today

## 2017-09-08 ENCOUNTER — Encounter (HOSPITAL_COMMUNITY): Payer: Self-pay | Admitting: Speech Pathology

## 2017-09-08 ENCOUNTER — Ambulatory Visit (HOSPITAL_COMMUNITY): Payer: Medicare Other | Attending: Internal Medicine | Admitting: Speech Pathology

## 2017-09-08 DIAGNOSIS — R41841 Cognitive communication deficit: Secondary | ICD-10-CM | POA: Diagnosis not present

## 2017-09-08 NOTE — Therapy (Signed)
[facility] [address] [place], [place], [address] Phone: [phone]   Fax:  [phone]  Speech Language Pathology Treatment  Patient Details  Name: Brandy [name] MRN: [id number] Date of Birth: Nov 06, 1940 Referring Provider: Barton Dubois   Encounter Date: 09/08/2017  End of Session - 09/08/17 1618    Visit Number  5    Number of Visits  7    Date for SLP Re-Evaluation  09/15/17    Authorization Type  UHC Medicare based on medical necessity    SLP Start Time  1525    SLP Stop Time   1610    SLP Time Calculation (min)  45 min    Activity Tolerance  Patient tolerated treatment well       Past Medical History:  Diagnosis Date  . Cardiomyopathy    with  a history of congestive heart failure and an EF of 25% in 5/08; nl EF  on the echo in 2011  . Chronic combined systolic and diastolic CHF (congestive heart failure) ([facility])   . Colon polyps   . Diverticulosis of colon   . Hypertension   . Hypertensive heart disease   . Osteopenia   . Tobacco abuse    50 pack year    Past Surgical History:  Procedure Laterality Date  . BREAST BIOPSY    . CARPAL TUNNEL RELEASE     right hand  . CERVICAL FUSION    . CESAREAN SECTION     2 times  . COLONOSCOPY  3220   pt uncertain as to whether polypectomy required or performed  . ECTOPIC PREGNANCY SURGERY    . RIGHT/LEFT HEART CATH AND CORONARY ANGIOGRAPHY N/A 06/03/2017   Procedure: RIGHT/LEFT HEART CATH AND CORONARY ANGIOGRAPHY;  Surgeon: Jettie Booze, MD;  Location: [facility];  Service: Cardiovascular;  Laterality: N/A;  . TONSILLECTOMY      There were no vitals filed for this visit.  Subjective Assessment - 09/08/17 1615    Subjective  "I feel like it is getting a little better."    Patient is accompained by:  Family member    Currently in Pain?  No/denies            ADULT SLP TREATMENT - 09/08/17 1616      General Information   Behavior/Cognition   Alert;Cooperative;Pleasant mood    Patient Positioning  Upright in chair    Oral care provided  N/A    HPI  Brandy [name] is a 77 y.o. female with medical history significant of dilated cardiomyopathy, chronic combined systolic and diastolic congestive heart failure, history of colon polyps and diverticuli, hypertension, hypertensive heart disease, osteopenia, history of tobacco abuse in the past (50-pack-year/quit in 2010) who presented to the ED on 07/12/2017 for evaluation of fall, confusion and slurred speech. MRI showed: Left M2 occlusion with comparatively small acute infarct along the lower left insula and external capsule. Small subacute infarct along the lateral left frontal cortex. 3 mm left superior hypophyseal artery region ICA aneurysm. Negative neck MRA.  No stenosis or embolic source seen. Mild chronic small vessel ischemia in the cerebral white matter. Pt was given MoCA in acute setting with a score of 23/30 with recommendation for follow up outpatient SLP services. She is referred for outpatient SLP by Dr. Barton Dubois.        Treatment Provided   Treatment provided  Cognitive-Linquistic      Pain Assessment   Pain Assessment  No/denies pain  Cognitive-Linquistic Treatment   Treatment focused on  Aphasia;Cognition;Patient/family/caregiver education    Skilled Treatment  Skilled SLP treatment focused on targeting fluency enhancing strategies and word finding strategies in conversation      Assessment / Recommendations / [place] with current plan of care         SLP Short Term Goals - 09/08/17 1618      SLP SHORT TERM GOAL #1   Title  Implement word finding strategies in structured tasks with min assist.    Baseline  dysnomia noted in complex conversations    Time  4    Period  Weeks    Status  Achieved      SLP SHORT TERM GOAL #2   Title  Complete moderate level divergent naming tasks with 90% acc when given min cues.    Baseline  7 /s/ words for  65%    Time  4    Period  Weeks    Status  Partially Met      SLP SHORT TERM GOAL #3   Title  Provide verbal description of object using at least 3 descriptives (function, size/shape, location) when given min cues.    Baseline  Introduced    Time  4    Period  Weeks    Status  Achieved      SLP SHORT TERM GOAL #4   Title  Pt will completed functional memory activities with 100% acc with use of memory strategies and min cues.     Baseline  75%    Time  4    Period  Weeks    Status  Partially Met      SLP SHORT TERM GOAL #5   Title  Pt will implement fluency enhancing strategies during conversational speech with min cues.    Baseline  mod cues    Time  3    Period  Weeks    Status  New       SLP Long Term Goals - 07/27/17 1422      SLP LONG TERM GOAL #1   Title  same as short       Plan - 09/08/17 1618    Clinical Impression Statement Pt accompanied to therapy by her spouse. She reports that she feels like she is doing better in regards to her speech. She voiced that she stutters more when she is talking about something upsetting (recent death in the family) and when fatigued. SLP shared that this is to be expected. Pt answered complex open ended questions in therapy with 95% fluency with only rare dysfluencies noted. SLP reinforced slowed rate of speech with pause when dysfluency occurs. Pt wishes to attend an addition session following SLP vacation.    Speech Therapy Frequency  1x /week    Duration  4 weeks    Treatment/Interventions  SLP instruction and feedback;Cognitive reorganization;Compensatory strategies;Compensatory techniques;Patient/family education;Language facilitation;Multimodal communcation approach    Potential to Achieve Goals  Good    SLP Home Exercise Plan  Complete HEP as assigned to facilitate carry over of treatment techniques and strategies to home/community environment.    Consulted and Agree with Plan of Care  Patient       Patient will benefit  from skilled therapeutic intervention in order to improve the following deficits and impairments:   Cognitive communication deficit    Problem List Patient Active Problem List   Diagnosis Date Noted  . Acute ischemic stroke ([facility])   .  Seasonal allergies 07/13/2017  . TIA (transient ischemic attack) 07/12/2017  . Anemia 07/12/2017  . Pneumonia 06/02/2017  . Pleural effusion 06/02/2017  . Acute on chronic systolic congestive heart failure ([facility] [employer])   . Elevated troponin   . Other fatigue 05/04/2017  . Dyspnea 05/04/2017  . Hypertensive heart disease   . Chronic combined systolic and diastolic CHF (congestive heart failure) ([facility])   . Hypertension   . HTN (hypertension) 01/22/2013  . TOBACCO ABUSE 02/18/2008  . CARDIOMYOPATHY 02/18/2008  . DIVERTICULOSIS OF COLON 02/18/2008  . OSTEOPENIA 02/18/2008   Thank you,  Genene Churn, [place]  [facility] 09/08/2017, 4:19 PM  [place], [place], [address] Phone: [phone]   Fax:  [phone]   Name: Lavera Vandermeer MRN: [id number] Date of Birth: 11-16-1940

## 2017-09-12 ENCOUNTER — Other Ambulatory Visit: Payer: Self-pay | Admitting: Cardiovascular Disease

## 2017-09-13 ENCOUNTER — Telehealth: Payer: Self-pay

## 2017-09-13 ENCOUNTER — Encounter (HOSPITAL_COMMUNITY): Payer: Self-pay | Admitting: Emergency Medicine

## 2017-09-13 ENCOUNTER — Observation Stay (HOSPITAL_COMMUNITY)
Admission: EM | Admit: 2017-09-13 | Discharge: 2017-09-14 | Disposition: A | Discharge status: Home / Self Care | Payer: Medicare Other | Attending: Internal Medicine | Admitting: Internal Medicine

## 2017-09-13 ENCOUNTER — Emergency Department (HOSPITAL_COMMUNITY): Payer: Medicare Other

## 2017-09-13 ENCOUNTER — Other Ambulatory Visit: Payer: Self-pay

## 2017-09-13 DIAGNOSIS — R7989 Other specified abnormal findings of blood chemistry: Secondary | ICD-10-CM

## 2017-09-13 DIAGNOSIS — R0602 Shortness of breath: Secondary | ICD-10-CM | POA: Insufficient documentation

## 2017-09-13 DIAGNOSIS — R072 Precordial pain: Secondary | ICD-10-CM | POA: Diagnosis not present

## 2017-09-13 DIAGNOSIS — D649 Anemia, unspecified: Secondary | ICD-10-CM | POA: Insufficient documentation

## 2017-09-13 DIAGNOSIS — I110 Hypertensive heart disease with heart failure: Secondary | ICD-10-CM | POA: Insufficient documentation

## 2017-09-13 DIAGNOSIS — R000 Tachycardia, unspecified: Secondary | ICD-10-CM | POA: Diagnosis not present

## 2017-09-13 DIAGNOSIS — D519 Vitamin B12 deficiency anemia, unspecified: Secondary | ICD-10-CM | POA: Diagnosis not present

## 2017-09-13 DIAGNOSIS — R748 Abnormal levels of other serum enzymes: Secondary | ICD-10-CM | POA: Diagnosis not present

## 2017-09-13 DIAGNOSIS — E876 Hypokalemia: Secondary | ICD-10-CM | POA: Diagnosis not present

## 2017-09-13 DIAGNOSIS — Z87891 Personal history of nicotine dependence: Secondary | ICD-10-CM | POA: Diagnosis not present

## 2017-09-13 DIAGNOSIS — R778 Other specified abnormalities of plasma proteins: Secondary | ICD-10-CM | POA: Diagnosis present

## 2017-09-13 DIAGNOSIS — I420 Dilated cardiomyopathy: Secondary | ICD-10-CM | POA: Diagnosis not present

## 2017-09-13 DIAGNOSIS — E538 Deficiency of other specified B group vitamins: Secondary | ICD-10-CM | POA: Diagnosis present

## 2017-09-13 DIAGNOSIS — I428 Other cardiomyopathies: Secondary | ICD-10-CM

## 2017-09-13 DIAGNOSIS — I5023 Acute on chronic systolic (congestive) heart failure: Secondary | ICD-10-CM | POA: Insufficient documentation

## 2017-09-13 LAB — TROPONIN I
TROPONIN I: 0.17 ng/mL — AB (ref <0.03)
Troponin I: 0.2 ng/mL (ref <0.03)

## 2017-09-13 LAB — TSH: TSH: 0.59 u[IU]/mL (ref 0.350–4.500)

## 2017-09-13 LAB — BASIC METABOLIC PANEL
ANION GAP: 7 (ref 5–15)
BUN: 18 mg/dL (ref 6–20)
CALCIUM: 8.7 mg/dL — AB (ref 8.9–10.3)
CHLORIDE: 106 mmol/L (ref 101–111)
CO2: 30 mmol/L (ref 22–32)
Creatinine, Ser: 1.24 mg/dL — ABNORMAL HIGH (ref 0.44–1.00)
GFR calc Af Amer: 48 mL/min — ABNORMAL LOW (ref >60)
GFR calc non Af Amer: 41 mL/min — ABNORMAL LOW (ref >60)
Glucose, Bld: 91 mg/dL (ref 65–99)
Potassium: 3.8 mmol/L (ref 3.5–5.1)
Sodium: 143 mmol/L (ref 135–145)

## 2017-09-13 LAB — D-DIMER, QUANTITATIVE: D-Dimer, Quant: 1.88 ug/mL-FEU — ABNORMAL HIGH (ref 0.00–0.50)

## 2017-09-13 LAB — BRAIN NATRIURETIC PEPTIDE: B Natriuretic Peptide: 369 pg/mL — ABNORMAL HIGH (ref 0.0–100.0)

## 2017-09-13 LAB — CBC
HCT: 38.1 % (ref 36.0–46.0)
HEMOGLOBIN: 11.8 g/dL — AB (ref 12.0–15.0)
MCH: 28 pg (ref 26.0–34.0)
MCHC: 31 g/dL (ref 30.0–36.0)
MCV: 90.5 fL (ref 78.0–100.0)
Platelets: 195 10*3/uL (ref 150–400)
RBC: 4.21 MIL/uL (ref 3.87–5.11)
RDW: 13.5 % (ref 11.5–15.5)
WBC: 7 10*3/uL (ref 4.0–10.5)

## 2017-09-13 MED ORDER — SODIUM CHLORIDE 0.9 % IV SOLN: 250.0000 mL | INTRAVENOUS | Status: DC | PRN

## 2017-09-13 MED ORDER — ISOSORB DINITRATE-HYDRALAZINE 20-37.5 MG PO TABS
1.0000 | ORAL_TABLET | Freq: Three times a day (TID) | ORAL | Status: DC
  Administered 2017-09-13 – 2017-09-14 (×2): 1 via ORAL
  Filled 2017-09-13 (×9): qty 1

## 2017-09-13 MED ORDER — ACETAMINOPHEN 650 MG RE SUPP: 650.0000 mg | Freq: Four times a day (QID) | RECTAL | Status: DC | PRN

## 2017-09-13 MED ORDER — TRAMADOL HCL 50 MG PO TABS
50.0000 mg | ORAL_TABLET | Freq: Four times a day (QID) | ORAL | Status: DC | PRN
  Administered 2017-09-13 – 2017-09-14 (×2): 50 mg via ORAL
  Filled 2017-09-13 (×2): qty 1

## 2017-09-13 MED ORDER — LORATADINE 10 MG PO TABS
10.0000 mg | ORAL_TABLET | Freq: Every day | ORAL | Status: DC
  Administered 2017-09-14: 10 mg via ORAL
  Filled 2017-09-13: qty 1

## 2017-09-13 MED ORDER — CARVEDILOL 12.5 MG PO TABS
12.5000 mg | ORAL_TABLET | Freq: Two times a day (BID) | ORAL | Status: DC
  Administered 2017-09-14: 12.5 mg via ORAL
  Filled 2017-09-13: qty 1

## 2017-09-13 MED ORDER — SODIUM CHLORIDE 0.9% FLUSH: 3.0000 mL | INTRAVENOUS | Status: DC | PRN

## 2017-09-13 MED ORDER — FUROSEMIDE 10 MG/ML IJ SOLN
20.0000 mg | Freq: Every day | INTRAMUSCULAR | Status: DC
  Administered 2017-09-14: 20 mg via INTRAVENOUS
  Filled 2017-09-13: qty 2

## 2017-09-13 MED ORDER — ASPIRIN 81 MG PO CHEW
324.0000 mg | CHEWABLE_TABLET | Freq: Once | ORAL | Status: AC
  Administered 2017-09-13: 324 mg via ORAL
  Filled 2017-09-13: qty 4

## 2017-09-13 MED ORDER — ENOXAPARIN SODIUM 40 MG/0.4ML ~~LOC~~ SOLN
40.0000 mg | SUBCUTANEOUS | Status: DC
  Administered 2017-09-13: 40 mg via SUBCUTANEOUS
  Filled 2017-09-13: qty 0.4

## 2017-09-13 MED ORDER — FUROSEMIDE 10 MG/ML IJ SOLN
40.0000 mg | Freq: Once | INTRAMUSCULAR | Status: AC
  Administered 2017-09-13: 40 mg via INTRAVENOUS
  Filled 2017-09-13: qty 4

## 2017-09-13 MED ORDER — ACETAMINOPHEN 325 MG PO TABS: 650.0000 mg | ORAL_TABLET | Freq: Four times a day (QID) | ORAL | Status: DC | PRN

## 2017-09-13 MED ORDER — SODIUM CHLORIDE 0.9% FLUSH
3.0000 mL | Freq: Two times a day (BID) | INTRAVENOUS | Status: DC
  Administered 2017-09-13 – 2017-09-14 (×2): 3 mL via INTRAVENOUS

## 2017-09-13 MED ORDER — ASPIRIN EC 81 MG PO TBEC
162.0000 mg | DELAYED_RELEASE_TABLET | Freq: Every day | ORAL | Status: DC
  Administered 2017-09-14: 162 mg via ORAL
  Filled 2017-09-13: qty 2

## 2017-09-13 MED ORDER — ATORVASTATIN CALCIUM 10 MG PO TABS
10.0000 mg | ORAL_TABLET | Freq: Every day | ORAL | Status: DC
  Administered 2017-09-13: 10 mg via ORAL
  Filled 2017-09-13: qty 1

## 2017-09-13 NOTE — ED Notes (Signed)
Attempted report to 300 RN.

## 2017-09-13 NOTE — ED Provider Notes (Signed)
Emergency Department Provider Note   I have reviewed the triage vital signs and the nursing notes.   HISTORY  Chief Complaint Shortness of Breath   HPI Brandy [name] is a 77 y.o. female with PMH of combined systolic/diastolic CHF (EF 63%), HTN, and tobacco use history presents to the ED for evaluation of worsening shortness of breath and leg swelling.  Patient has some associated right-sided chest pain.  Symptoms began this morning and have progressively worsened.  She takes Lasix every other day but took an extra dose this morning with presumed CHF exacerbation.  She states that her urine production did not significantly increase.  She developed a dull right-sided pain in her chest.  Shortness of breath is worse with lying flat and worse with exertion.  She endorses a nonproductive cough and has noticed some increased swelling in her left foot which occurs intermittently.  She follows with local cardiology, Dr. Bronson Ing.  Denies any abdominal or back pain.  No vomiting or diarrhea. No fever/chills.    Past Medical History:  Diagnosis Date  . Cardiomyopathy    with  a history of congestive heart failure and an EF of 25% in 5/08; nl EF  on the echo in 2011  . Chronic combined systolic and diastolic CHF (congestive heart failure) ([place])   . Colon polyps   . Diverticulosis of colon   . Hypertension   . Hypertensive heart disease   . Osteopenia   . Tobacco abuse    50 pack year    Patient Active Problem List   Diagnosis Date Noted  . Acute ischemic stroke ([place])   . Seasonal allergies 07/13/2017  . TIA (transient ischemic attack) 07/12/2017  . Anemia 07/12/2017  . Pneumonia 06/02/2017  . Pleural effusion 06/02/2017  . Acute on chronic systolic congestive heart failure ([place])   . Elevated troponin   . Other fatigue 05/04/2017  . Dyspnea 05/04/2017  . Hypertensive heart disease   . Chronic combined systolic and diastolic CHF (congestive heart failure) ([place])   . Hypertension    . HTN (hypertension) 01/22/2013  . TOBACCO ABUSE 02/18/2008  . CARDIOMYOPATHY 02/18/2008  . DIVERTICULOSIS OF COLON 02/18/2008  . OSTEOPENIA 02/18/2008    Past Surgical History:  Procedure Laterality Date  . BREAST BIOPSY    . CARPAL TUNNEL RELEASE     right hand  . CERVICAL FUSION    . CESAREAN SECTION     2 times  . COLONOSCOPY  7858   pt uncertain as to whether polypectomy required or performed  . ECTOPIC PREGNANCY SURGERY    . RIGHT/LEFT HEART CATH AND CORONARY ANGIOGRAPHY N/A 06/03/2017   Procedure: RIGHT/LEFT HEART CATH AND CORONARY ANGIOGRAPHY;  Surgeon: Jettie Booze, MD;  Location: [facility];  Service: Cardiovascular;  Laterality: N/A;  . TONSILLECTOMY      Allergies Lisinopril  Family History  Problem Relation Age of Onset  . Hypertension Brother   . Hypertension Maternal Grandmother     Social History Social History   Tobacco Use  . Smoking status: Former Smoker    Packs/day: 1.00    Years: 50.00    Pack years: 50.00    Types: Cigarettes    Start date: 09/02/1960    Last attempt to quit: 03/29/2008    Years since quitting: 9.4  . Smokeless tobacco: Never Used  Substance Use Topics  . Alcohol use: No    Alcohol/week: 0.0 oz  . Drug use: Never    Review of  Systems  Constitutional: No fever/chills Eyes: No visual changes. ENT: No sore throat. Cardiovascular: Positive right sided chest pain. Respiratory: Positive shortness of breath. Gastrointestinal: No abdominal pain.  No nausea, no vomiting.  No diarrhea.  No constipation. Genitourinary: Negative for dysuria. Musculoskeletal: Negative for back pain. Positive left foot swelling.  Skin: Negative for rash. Neurological: Negative for headaches, focal weakness or numbness.  10-point ROS otherwise negative.  ____________________________________________   PHYSICAL EXAM:  VITAL SIGNS: ED Triage Vitals  Enc Vitals Group     BP 09/13/17 1631 138/89     Pulse Rate 09/13/17 1631 99       Resp 09/13/17 1631 15     Temp 09/13/17 1631 98.3 F (36.8 C)     Temp Source 09/13/17 1631 Oral     SpO2 09/13/17 1631 95 %     Weight 09/13/17 1630 165 lb (74.8 kg)     Height 09/13/17 1630 5' 4.5" (1.638 m)     Pain Score 09/13/17 1629 8   Constitutional: Alert and oriented. Well appearing and in no acute distress. Eyes: Conjunctivae are normal. Head: Atraumatic. Nose: No congestion/rhinnorhea. Mouth/Throat: Mucous membranes are moist.  Oropharynx non-erythematous. Neck: No stridor.  Cardiovascular: Normal rate, regular rhythm. Good peripheral circulation. Grossly normal heart sounds.   Respiratory: Slightly increased respiratory effort at rest.  No retractions. Lungs with crackles at the bases. Gastrointestinal: Soft and nontender. No distention.  Musculoskeletal: No lower extremity tenderness. Positive trace pitting edema bilaterally with worsening edema in the left foot. No gross deformities of extremities. Neurologic:  Normal speech and language. No gross focal neurologic deficits are appreciated.  Skin:  Skin is warm, dry and intact. No rash noted. No cellulitis.   ____________________________________________   LABS (all labs ordered are listed, but only abnormal results are displayed)  Labs Reviewed  BASIC METABOLIC PANEL - Abnormal; Notable for the following components:      Result Value   Creatinine, Ser 1.24 (*)    Calcium 8.7 (*)    GFR calc non Af Amer 41 (*)    GFR calc Af Amer 48 (*)    All other components within normal limits  CBC - Abnormal; Notable for the following components:   Hemoglobin 11.8 (*)    All other components within normal limits  TROPONIN I - Abnormal; Notable for the following components:   Troponin I 0.17 (*)    All other components within normal limits  BRAIN NATRIURETIC PEPTIDE - Abnormal; Notable for the following components:   B Natriuretic Peptide 369.0 (*)    All other components within normal limits    ____________________________________________  EKG   EKG Interpretation  Date/Time:  Tuesday September 13 2017 16:33:43 EDT Ventricular Rate:  99 PR Interval:    QRS Duration: 105 QT Interval:  352 QTC Calculation: 452 R Axis:   -73 Text Interpretation:  Sinus rhythm Left anterior fascicular block Anterior infarct, old ST elevation, consider inferior injury No STEMI.  Confirmed by Nanda Quinton [phone]) on 09/13/2017 4:45:09 PM       ____________________________________________  RADIOLOGY  Dg Chest 2 View  Result Date: 09/13/2017 CLINICAL DATA:  Shortness of breath, lower extremity edema, and nonproductive cough. EXAM: CHEST - 2 VIEW COMPARISON:  07/12/2017 FINDINGS: The cardiac silhouette remains mildly enlarged. There may be a persistent trace left pleural effusion. No airspace consolidation, edema, or pneumothorax is identified. Prior cervical spinal fusion is noted. IMPRESSION: Cardiomegaly and possible trace persistent left pleural effusion. No evidence of acute airspace  disease. Electronically Signed   By: Logan Bores M.D.   On: 09/13/2017 18:39   US Venous Img Lower  Left (dvt Study)  Result Date: 09/13/2017 CLINICAL DATA:  Left lower extremity pain and edema for the past day. Evaluate for DVT. EXAM: LEFT LOWER EXTREMITY VENOUS DOPPLER ULTRASOUND TECHNIQUE: Gray-scale sonography with graded compression, as well as color Doppler and duplex ultrasound were performed to evaluate the lower extremity deep venous systems from the level of the common femoral vein and including the common femoral, femoral, profunda femoral, popliteal and calf veins including the posterior tibial, peroneal and gastrocnemius veins when visible. The superficial great saphenous vein was also interrogated. Spectral Doppler was utilized to evaluate flow at rest and with distal augmentation maneuvers in the common femoral, femoral and popliteal veins. COMPARISON:  None. FINDINGS: Contralateral Common Femoral Vein:  Respiratory phasicity is normal and symmetric with the symptomatic side. No evidence of thrombus. Normal compressibility. Common Femoral Vein: No evidence of thrombus. Normal compressibility, respiratory phasicity and response to augmentation. Saphenofemoral Junction: No evidence of thrombus. Normal compressibility and flow on color Doppler imaging. Profunda Femoral Vein: No evidence of thrombus. Normal compressibility and flow on color Doppler imaging. Femoral Vein: No evidence of thrombus. Normal compressibility, respiratory phasicity and response to augmentation. Popliteal Vein: No evidence of thrombus. Normal compressibility, respiratory phasicity and response to augmentation. Calf Veins: No evidence of thrombus. Normal compressibility and flow on color Doppler imaging. Superficial Great Saphenous Vein: No evidence of thrombus. Normal compressibility. Venous Reflux:  None. Other Findings:  None. IMPRESSION: No evidence of DVT within the left lower extremity. Electronically Signed   By: Sandi Mariscal M.D.   On: 09/13/2017 17:47    ____________________________________________   PROCEDURES  Procedure(s) performed:   .Critical Care Performed by: Margette Fast, MD Authorized by: Margette Fast, MD   Critical care provider statement:    Critical care time (minutes):  35   Critical care time was exclusive of:  Separately billable procedures and treating other patients and teaching time   Critical care was necessary to treat or prevent imminent or life-threatening deterioration of the following conditions:  Respiratory failure and cardiac failure   Critical care was time spent personally by me on the following activities:  Blood draw for specimens, development of treatment plan with patient or surrogate, evaluation of patient's response to treatment, examination of patient, obtaining history from patient or surrogate, ordering and performing treatments and interventions, ordering and review of  laboratory studies, ordering and review of radiographic studies, pulse oximetry, re-evaluation of patient's condition and review of old charts   I assumed direction of critical care for this patient from another provider in my specialty: no     ____________________________________________   INITIAL IMPRESSION / ASSESSMENT AND PLAN / ED COURSE  Pertinent labs & imaging results that were available during my care of the patient were reviewed by me and considered in my medical decision making (see chart for details).  Patient presents to the emergency department for evaluation of worsening dyspnea.  Dyspnea pattern seems consistent with fluid overload.  The patient does have some asymmetric edema in the lower extremities worse on the left.  She is also complaining of some right-sided chest pressure which is nonradiating.  Patient has multiple CAD risk factors.  She has known combined congestive heart failure has tried increasing her Lasix at home today with no significant urine output.  Plan for labs and chest x-ray.  I have ordered a left lower  extremity DVT study as well but lower suspicion for thromboembolic disease.  Labs reviewed with elevated troponin noted. 0.17 value seen in prior testing. Question baseline myocardial injury. Doubt primary ACS but patient is having right sided chest pain and dyspnea. CXR reviewed along with labs. Patient given ASA and lasix. Continue to follow. No hypoxemia. Will admit for further enzyme trending and monitoring of respiratory status.   Discussed patient's case with Hospitalist, Dr. Maudie Mercury to request admission. Patient and family (if present) updated with plan. Care transferred to Hospitalist service.  I reviewed all nursing notes, vitals, pertinent old records, EKGs, labs, imaging (as available).  ____________________________________________  FINAL CLINICAL IMPRESSION(S) / ED DIAGNOSES  Final diagnoses:  Precordial chest pain  SOB (shortness of breath)      MEDICATIONS GIVEN DURING THIS VISIT:  Medications  aspirin chewable tablet 324 mg (324 mg Oral Given 09/13/17 1800)  furosemide (LASIX) injection 40 mg (40 mg Intravenous Given 09/13/17 1800)    Note:  This document was prepared using Dragon voice recognition software and may include unintentional dictation errors.  Nanda Quinton, MD Emergency Medicine    Daanish Copes, [facility], MD 09/13/17 1900

## 2017-09-13 NOTE — ED Notes (Signed)
Report given to 300 RN. 

## 2017-09-13 NOTE — H&P (Signed)
TRH H&P   Patient Demographics:    Brandy [name], is a 77 y.o. female  MRN: [id number]   DOB - Feb 12, 1941  Admit Date - 09/13/2017  Outpatient Primary MD for the patient is [name], MD  Referring MD/NP/PA:  Dr. Laverta Baltimore  Outpatient Specialists:  Dr. Bronson Ing  Patient coming from:  home  Chief Complaint  Patient presents with  . Shortness of Breath      HPI:    Brandy [name]  is a 77 y.o. female, w hypertension, CHF(EF 40%) , mild MR, c/o right sided chest/ back pain as well as dyspnea.  Pt notes has had some dyspnea for a while but worse today. Slight cough. (dry)  intermittent Pt doesn't recall any trauma to chest wall.  Denies fever, chills, palp, n/v, diarrhea, brbpr.   In ED,  Pt slightly tachycardic  EKG st at 100, lad, q in 2, 3, avf, v1-3  Bun 18, Creatinine 1.24 Wbc 7.0, Hgb 11.8, Plt 195  Trop 0.17  BNP 369.0  Pt will be admitted for atypical chest pain, dyspnea       Review of systems:    In addition to the HPI above,  No Fever-chills, No Headache, No changes with Vision or hearing, No problems swallowing food or Liquids,  No Abdominal pain, No Nausea or Vommitting, Bowel movements are regular, No Blood in stool or Urine, No dysuria, No new skin rashes or bruises, No new joints pains-aches,  No new weakness, tingling, numbness in any extremity, No recent weight gain or loss, No polyuria, polydypsia or polyphagia, No significant Mental Stressors.  A full 10 point Review of Systems was done, except as stated above, all other Review of Systems were negative.   With Past History of the following :    Past Medical History:  Diagnosis Date  . Cardiomyopathy    with  a history of congestive heart failure and an EF of 25% in 5/08; nl EF  on the echo in 2011  . Chronic combined systolic and diastolic CHF (congestive heart failure) ([place])    . Colon polyps   . Diverticulosis of colon   . Hypertension   . Hypertensive heart disease   . Osteopenia   . Tobacco abuse    50 pack year      Past Surgical History:  Procedure Laterality Date  . BREAST BIOPSY    . CARPAL TUNNEL RELEASE     right hand  . CERVICAL FUSION    . CESAREAN SECTION     2 times  . COLONOSCOPY  5462   pt uncertain as to whether polypectomy required or performed  . ECTOPIC PREGNANCY SURGERY    . RIGHT/LEFT HEART CATH AND CORONARY ANGIOGRAPHY N/A 06/03/2017   Procedure: RIGHT/LEFT HEART CATH AND CORONARY ANGIOGRAPHY;  Surgeon: Jettie Booze, MD;  Location: [facility];  Service: Cardiovascular;  Laterality:  N/A;  . TONSILLECTOMY        Social History:     Social History   Tobacco Use  . Smoking status: Former Smoker    Packs/day: 1.00    Years: 50.00    Pack years: 50.00    Types: Cigarettes    Start date: 09/02/1960    Last attempt to quit: 03/29/2008    Years since quitting: 9.4  . Smokeless tobacco: Never Used  Substance Use Topics  . Alcohol use: No    Alcohol/week: 0.0 oz     Lives - lives at home  Mobility - walks by self   Family History :     Family History  Problem Relation Age of Onset  . Hypertension Brother   . Hypertension Maternal Grandmother       Home Medications:   Prior to Admission medications   Medication Sig Start Date End Date Taking? Authorizing Provider  aspirin EC 81 MG EC tablet Take 2 tablets (162 mg total) by mouth daily. 07/14/17  Yes Barton Dubois, MD  atorvastatin (LIPITOR) 10 MG tablet TAKE 1 TABLET BY MOUTH EVERY DAY AT 6 PM 09/12/17  Yes Herminio Commons, MD  BIDIL 20-37.5 MG tablet TAKE 1 TABLET BY MOUTH THREE TIMES DAILY 09/12/17  Yes Herminio Commons, MD  carvedilol (COREG) 12.5 MG tablet Take 1 tablet (12.5 mg total) by mouth 2 (two) times daily with a meal. 08/25/17  Yes Herminio Commons, MD  cetirizine (ZYRTEC) 10 MG tablet Take 10 mg by mouth daily as needed for  allergies.    Yes [provider]  cyanocobalamin (,VITAMIN B-12,) 1000 MCG/ML injection Daily for 1 week, then weekly for 1 month and then monthly (for 8 months) Patient taking differently: 1,000 mcg every 30 (thirty) days. Daily for 1 week, then weekly for 1 month and then monthly (for 8 months) 07/14/17  Yes Barton Dubois, MD  furosemide (LASIX) 20 MG tablet Take 1 tablet (20 mg total) by mouth every other day. Patient taking differently: Take 20 mg by mouth daily as needed for fluid.  07/19/17 10/17/17 Yes Herminio Commons, MD  potassium chloride SA (K-DUR,KLOR-CON) 20 MEQ tablet Take 1 tablet (20 mEq total) by mouth daily. Patient taking differently: Take 20 mEq by mouth every other day.  07/14/17  Yes Barton Dubois, MD  predniSONE (DELTASONE) 5 MG tablet Take 5 mg by mouth daily as needed (for swelling and right knee pain).   Yes [provider]     Allergies:     Allergies  Allergen Reactions  . Lisinopril Swelling     Physical Exam:   Vitals  Blood pressure 139/77, pulse 98, temperature 98.3 F (36.8 C), temperature source Oral, resp. rate 20, height 5' 4.5" (1.638 m), weight 74.8 kg (165 lb), SpO2 92 %.   1. General  lying in bed in NAD,    2. Normal affect and insight, Not Suicidal or Homicidal, Awake Alert, Oriented X 3.  3. No F.N deficits, ALL C.Nerves Intact, Strength 5/5 all 4 extremities, Sensation intact all 4 extremities, Plantars down going.  4. Ears and Eyes appear Normal, Conjunctivae clear, PERRLA. Moist Oral Mucosa.  5. Supple Neck, No JVD, No cervical lymphadenopathy appriciated, No Carotid Bruits.  6. Symmetrical Chest wall movement, Good air movement bilaterally,  Slight crackles left lung base, no wheezing  7. RRR, No Gallops, Rubs or Murmurs, No Parasternal Heave.  8. Positive Bowel Sounds, Abdomen Soft, No tenderness, No organomegaly appriciated,No rebound -guarding  or rigidity.  9.  No Cyanosis, Normal Skin Turgor, No Skin  Rash or Bruise.  10. Good muscle tone,  joints appear normal , no effusions, Normal ROM.  11. No Palpable Lymph Nodes in Neck or Axillae     Data Review:    CBC Recent Labs  Lab 09/13/17 1631  WBC 7.0  HGB 11.8*  HCT 38.1  PLT 195  MCV 90.5  MCH 28.0  MCHC 31.0  RDW 13.5   ------------------------------------------------------------------------------------------------------------------  Chemistries  Recent Labs  Lab 09/13/17 1631  NA 143  K 3.8  CL 106  CO2 30  GLUCOSE 91  BUN 18  CREATININE 1.24*  CALCIUM 8.7*   ------------------------------------------------------------------------------------------------------------------ estimated creatinine clearance is 39.3 mL/min (A) (by C-G formula based on SCr of 1.24 mg/dL (H)). ------------------------------------------------------------------------------------------------------------------ No results for input(s): TSH, T4TOTAL, T3FREE, THYROIDAB in the last 72 hours.  Invalid input(s): FREET3  Coagulation profile No results for input(s): INR, PROTIME in the last 168 hours. ------------------------------------------------------------------------------------------------------------------- No results for input(s): DDIMER in the last 72 hours. -------------------------------------------------------------------------------------------------------------------  Cardiac Enzymes Recent Labs  Lab 09/13/17 1631  TROPONINI 0.17*   ------------------------------------------------------------------------------------------------------------------    Component Value Date/Time   BNP 369.0 (H) 09/13/2017 1631     ---------------------------------------------------------------------------------------------------------------  Urinalysis    Component Value Date/Time   COLORURINE YELLOW 07/12/2017 1754   APPEARANCEUR CLEAR 07/12/2017 1754   LABSPEC 1.016 07/12/2017 1754   PHURINE 7.0 07/12/2017 1754   GLUCOSEU NEGATIVE  07/12/2017 1754   HGBUR NEGATIVE 07/12/2017 1754   BILIRUBINUR NEGATIVE 07/12/2017 1754   [place] 07/12/2017 1754   PROTEINUR NEGATIVE 07/12/2017 1754   NITRITE NEGATIVE 07/12/2017 1754   LEUKOCYTESUR NEGATIVE 07/12/2017 1754    ----------------------------------------------------------------------------------------------------------------   Imaging Results:    Dg Chest 2 View  Result Date: 09/13/2017 CLINICAL DATA:  Shortness of breath, lower extremity edema, and nonproductive cough. EXAM: CHEST - 2 VIEW COMPARISON:  07/12/2017 FINDINGS: The cardiac silhouette remains mildly enlarged. There may be a persistent trace left pleural effusion. No airspace consolidation, edema, or pneumothorax is identified. Prior cervical spinal fusion is noted. IMPRESSION: Cardiomegaly and possible trace persistent left pleural effusion. No evidence of acute airspace disease. Electronically Signed   By: Logan Bores M.D.   On: 09/13/2017 18:39   US Venous Img Lower  Left (dvt Study)  Result Date: 09/13/2017 CLINICAL DATA:  Left lower extremity pain and edema for the past day. Evaluate for DVT. EXAM: LEFT LOWER EXTREMITY VENOUS DOPPLER ULTRASOUND TECHNIQUE: Gray-scale sonography with graded compression, as well as color Doppler and duplex ultrasound were performed to evaluate the lower extremity deep venous systems from the level of the common femoral vein and including the common femoral, femoral, profunda femoral, popliteal and calf veins including the posterior tibial, peroneal and gastrocnemius veins when visible. The superficial great saphenous vein was also interrogated. Spectral Doppler was utilized to evaluate flow at rest and with distal augmentation maneuvers in the common femoral, femoral and popliteal veins. COMPARISON:  None. FINDINGS: Contralateral Common Femoral Vein: Respiratory phasicity is normal and symmetric with the symptomatic side. No evidence of thrombus. Normal compressibility.  Common Femoral Vein: No evidence of thrombus. Normal compressibility, respiratory phasicity and response to augmentation. Saphenofemoral Junction: No evidence of thrombus. Normal compressibility and flow on color Doppler imaging. Profunda Femoral Vein: No evidence of thrombus. Normal compressibility and flow on color Doppler imaging. Femoral Vein: No evidence of thrombus. Normal compressibility, respiratory phasicity and response to augmentation. Popliteal Vein: No evidence of thrombus. Normal compressibility, respiratory phasicity  and response to augmentation. Calf Veins: No evidence of thrombus. Normal compressibility and flow on color Doppler imaging. Superficial Great Saphenous Vein: No evidence of thrombus. Normal compressibility. Venous Reflux:  None. Other Findings:  None. IMPRESSION: No evidence of DVT within the left lower extremity. Electronically Signed   By: Sandi Mariscal M.D.   On: 09/13/2017 17:47       Assessment & Plan:    Principal Problem:   Elevated troponin Active Problems:   Anemia   B12 deficiency   Tachycardia    Right sided back/ side  Pain, ? Chest pain Tele Trop I q6h x3 D dimer, if positive then CTA chest r/o PE Check cardiac echo  Troponin elevation Check cardiac echo Cardiology consult placed in computer  Tachycardia Check tsh Check d dimer as above  CHF (EF 40%) Cont carvedilol Cont bidil Stop oral furosemide Furosemide 20mg  iv qday  Stroke Cont aspirin Cont Lipitor  Glucose intolerance Check hga1c   Anemia Check cbc in am  B12 deficiency Cont vitamin b12    DVT Prophylaxis  Lovenox - SCDs  AM Labs Ordered, also please review Full Orders  Family Communication: Admission, patients condition and plan of care including tests being ordered have been discussed with the patient who indicate understanding and agree with the plan and Code Status.  Code Status  FULL CODE  Likely DC to  home  Condition GUARDED    Consults called:   none  Admission status: observation  Time spent in minutes : 45   Jani Gravel M.D on 09/13/2017 at 7:11 PM  Between 7am to 7pm - Pager - [phone]  After 7pm go to [email or web address] - password [email or web address]  [facility] - Office  [phone]

## 2017-09-13 NOTE — ED Notes (Signed)
CRITICAL VALUE ALERT  Critical Value:  Troponin  0.17   Date & Time Notied: 09/13/17  17:52  Provider Notified: Dr. Laverta Baltimore

## 2017-09-13 NOTE — ED Triage Notes (Signed)
Pt c/o SOB that worsens with exertion or lying back, lower extremity edema, and non-productive cough that began today. Pt hx of CHF.

## 2017-09-13 NOTE — Telephone Encounter (Signed)
[name] husband called stating that pt was having some pain in her upper right back. He stated she asked him to call to see if she could get an appointment today. He said her pain has lasted about 5 hours. He stated  she did not complain of any chest pain, nor chest tightness, and she stays SOB at all times. He said she stated that it was painful when she would breathe as it was in her "right lung." I advised pt's husband that we did not have an opening today, but I would advise her to go be evaluated in the ED. He voiced understanding and  stated he would tell her.

## 2017-09-14 ENCOUNTER — Other Ambulatory Visit: Payer: Self-pay | Admitting: Student

## 2017-09-14 ENCOUNTER — Observation Stay (HOSPITAL_COMMUNITY): Payer: Medicare Other

## 2017-09-14 ENCOUNTER — Encounter (HOSPITAL_COMMUNITY): Payer: Self-pay | Admitting: Student

## 2017-09-14 DIAGNOSIS — R748 Abnormal levels of other serum enzymes: Secondary | ICD-10-CM | POA: Diagnosis not present

## 2017-09-14 DIAGNOSIS — E876 Hypokalemia: Secondary | ICD-10-CM

## 2017-09-14 DIAGNOSIS — N183 Chronic kidney disease, stage 3 unspecified: Secondary | ICD-10-CM

## 2017-09-14 DIAGNOSIS — I5030 Unspecified diastolic (congestive) heart failure: Secondary | ICD-10-CM | POA: Diagnosis not present

## 2017-09-14 DIAGNOSIS — R000 Tachycardia, unspecified: Secondary | ICD-10-CM

## 2017-09-14 DIAGNOSIS — E785 Hyperlipidemia, unspecified: Secondary | ICD-10-CM

## 2017-09-14 DIAGNOSIS — I248 Other forms of acute ischemic heart disease: Secondary | ICD-10-CM | POA: Diagnosis not present

## 2017-09-14 DIAGNOSIS — I428 Other cardiomyopathies: Secondary | ICD-10-CM | POA: Diagnosis not present

## 2017-09-14 DIAGNOSIS — I10 Essential (primary) hypertension: Secondary | ICD-10-CM

## 2017-09-14 DIAGNOSIS — I130 Hypertensive heart and chronic kidney disease with heart failure and stage 1 through stage 4 chronic kidney disease, or unspecified chronic kidney disease: Secondary | ICD-10-CM | POA: Diagnosis not present

## 2017-09-14 DIAGNOSIS — I5043 Acute on chronic combined systolic (congestive) and diastolic (congestive) heart failure: Secondary | ICD-10-CM

## 2017-09-14 DIAGNOSIS — E538 Deficiency of other specified B group vitamins: Secondary | ICD-10-CM | POA: Diagnosis not present

## 2017-09-14 DIAGNOSIS — R072 Precordial pain: Secondary | ICD-10-CM

## 2017-09-14 DIAGNOSIS — I5042 Chronic combined systolic (congestive) and diastolic (congestive) heart failure: Secondary | ICD-10-CM

## 2017-09-14 DIAGNOSIS — M549 Dorsalgia, unspecified: Secondary | ICD-10-CM | POA: Diagnosis not present

## 2017-09-14 LAB — CBC
HEMATOCRIT: 35.8 % — AB (ref 36.0–46.0)
HEMOGLOBIN: 11 g/dL — AB (ref 12.0–15.0)
MCH: 27.6 pg (ref 26.0–34.0)
MCHC: 30.7 g/dL (ref 30.0–36.0)
MCV: 89.7 fL (ref 78.0–100.0)
PLATELETS: 191 10*3/uL (ref 150–400)
RBC: 3.99 MIL/uL (ref 3.87–5.11)
RDW: 13.5 % (ref 11.5–15.5)
WBC: 7.7 10*3/uL (ref 4.0–10.5)

## 2017-09-14 LAB — COMPREHENSIVE METABOLIC PANEL
ALBUMIN: 3.4 g/dL — AB (ref 3.5–5.0)
ALT: 8 U/L — ABNORMAL LOW (ref 14–54)
AST: 13 U/L — AB (ref 15–41)
Alkaline Phosphatase: 54 U/L (ref 38–126)
Anion gap: 9 (ref 5–15)
BUN: 17 mg/dL (ref 6–20)
CHLORIDE: 102 mmol/L (ref 101–111)
CO2: 30 mmol/L (ref 22–32)
CREATININE: 1.11 mg/dL — AB (ref 0.44–1.00)
Calcium: 8.5 mg/dL — ABNORMAL LOW (ref 8.9–10.3)
GFR calc non Af Amer: 47 mL/min — ABNORMAL LOW (ref >60)
GFR, EST AFRICAN AMERICAN: 55 mL/min — AB (ref >60)
Glucose, Bld: 98 mg/dL (ref 65–99)
Potassium: 3 mmol/L — ABNORMAL LOW (ref 3.5–5.1)
Sodium: 141 mmol/L (ref 135–145)
Total Bilirubin: 0.5 mg/dL (ref 0.3–1.2)
Total Protein: 6.3 g/dL — ABNORMAL LOW (ref 6.5–8.1)

## 2017-09-14 LAB — HEMOGLOBIN A1C
Hgb A1c MFr Bld: 5.2 % (ref 4.8–5.6)
Mean Plasma Glucose: 102.54 mg/dL

## 2017-09-14 LAB — LIPID PANEL
CHOL/HDL RATIO: 2.8 ratio
CHOLESTEROL: 88 mg/dL (ref 0–200)
HDL: 32 mg/dL — AB (ref >40)
LDL Cholesterol: 49 mg/dL (ref 0–99)
TRIGLYCERIDES: 37 mg/dL (ref <150)
VLDL: 7 mg/dL (ref 0–40)

## 2017-09-14 LAB — TROPONIN I
Troponin I: 0.21 ng/mL (ref <0.03)
Troponin I: 0.24 ng/mL (ref <0.03)

## 2017-09-14 MED ORDER — POTASSIUM CHLORIDE 20 MEQ PO PACK
60.0000 meq | PACK | Freq: Once | ORAL | Status: AC
  Administered 2017-09-14: 60 meq via ORAL
  Filled 2017-09-14: qty 3

## 2017-09-14 MED ORDER — FUROSEMIDE 20 MG PO TABS: 20.0000 mg | ORAL_TABLET | Freq: Every day | ORAL | 3 refills | Status: DC

## 2017-09-14 MED ORDER — IOPAMIDOL (ISOVUE-370) INJECTION 76%
80.0000 mL | Freq: Once | INTRAVENOUS | Status: AC | PRN
  Administered 2017-09-14: 80 mL via INTRAVENOUS

## 2017-09-14 NOTE — Discharge Instructions (Signed)
Heart Failure Heart failure is a condition in which the heart has trouble pumping blood because it has become weak or stiff. This means that the heart does not pump blood efficiently for the body to work well. For some people with heart failure, fluid may back up into the lungs and there may be swelling (edema) in the lower legs. Heart failure is usually a long-term (chronic) condition. It is important for you to take good care of yourself and follow the treatment plan from your health care provider. What are the causes? This condition is caused by some health problems, including:  High blood pressure (hypertension). Hypertension causes the heart muscle to work harder than normal. High blood pressure eventually causes the heart to become stiff and weak.  Coronary artery disease (CAD). CAD is the buildup of cholesterol and fat (plaques) in the arteries of the heart.  Heart attack (myocardial infarction). Injured tissue, which is caused by the heart attack, does not contract as well and the heart's ability to pump blood is weakened.  Abnormal heart valves. When the heart valves do not open and close properly, the heart muscle must pump harder to keep the blood flowing.  Heart muscle disease (cardiomyopathy or myocarditis). Heart muscle disease is damage to the heart muscle from a variety of causes, such as drug or alcohol abuse, infections, or unknown causes. These can increase the risk of heart failure.  Lung disease. When the lungs do not work properly, the heart must work harder.  What increases the risk? Risk of heart failure increases as a person ages. This condition is also more likely to develop in people who:  Are overweight.  Are female.  Smoke or chew tobacco.  Abuse alcohol or illegal drugs.  Have taken medicines that can damage the heart, such as chemotherapy drugs.  Have diabetes. ? High blood sugar (glucose) is associated with high fat (lipid) levels in the blood. ? Diabetes  can also damage tiny blood vessels that carry nutrients to the heart muscle.  Have abnormal heart rhythms.  Have thyroid problems.  Have low blood counts (anemia).  What are the signs or symptoms? Symptoms of this condition include:  Shortness of breath with activity, such as when climbing stairs.  Persistent cough.  Swelling of the feet, ankles, legs, or abdomen.  Unexplained weight gain.  Difficulty breathing when lying flat (orthopnea).  Waking from sleep because of the need to sit up and get more air.  Rapid heartbeat.  Fatigue and loss of energy.  Feeling light-headed, dizzy, or close to fainting.  Loss of appetite.  Nausea.  Increased urination during the night (nocturia).  Confusion.  How is this diagnosed? This condition is diagnosed based on:  Medical history, symptoms, and a physical exam.  Diagnostic tests, which may include: ? Echocardiogram. ? Electrocardiogram (ECG). ? Chest X-ray. ? Blood tests. ? Exercise stress test. ? Radionuclide scans. ? Cardiac catheterization and angiogram.  How is this treated? Treatment for this condition is aimed at managing the symptoms of heart failure. Medicines, behavioral changes, or other treatments may be necessary to treat heart failure. Medicines These may include:  Angiotensin-converting enzyme (ACE) inhibitors. This type of medicine blocks the effects of a blood protein called angiotensin-converting enzyme. ACE inhibitors relax (dilate) the blood vessels and help to lower blood pressure.  Angiotensin receptor blockers (ARBs). This type of medicine blocks the actions of a blood protein called angiotensin. ARBs dilate the blood vessels and help to lower blood pressure.  Water   pills (diuretics). Diuretics cause the kidneys to remove salt and water from the blood. The extra fluid is removed through urination, leaving a lower volume of blood that the heart has to pump.  Beta blockers. These improve heart  muscle strength and they prevent the heart from beating too quickly.  Digoxin. This increases the force of the heartbeat.  Healthy behavior changes These may include:  Reaching and maintaining a healthy weight.  Stopping smoking or chewing tobacco.  Eating heart-healthy foods.  Limiting or avoiding alcohol.  Stopping use of street drugs (illegal drugs).  Physical activity.  Other treatments These may include:  Surgery to open blocked coronary arteries or repair damaged heart valves.  Placement of a biventricular pacemaker to improve heart muscle function (cardiac resynchronization therapy). This device paces both the right ventricle and left ventricle.  Placement of a device to treat serious abnormal heart rhythms (implantable cardioverter defibrillator, or ICD).  Placement of a device to improve the pumping ability of the heart (left ventricular assist device, or LVAD).  Heart transplant. This can cure heart failure, and it is considered for certain patients who do not improve with other therapies.  Follow these instructions at home: Medicines  Take over-the-counter and prescription medicines only as told by your health care provider. Medicines are important in reducing the workload of your heart, slowing the progression of heart failure, and improving your symptoms. ? Do not stop taking your medicine unless your health care provider told you to do that. ? Do not skip any dose of medicine. ? Refill your prescriptions before you run out of medicine. You need your medicines every day. Eating and drinking   Eat heart-healthy foods. Talk with a dietitian to make an eating plan that is right for you. ? Choose foods that contain no trans fat and are low in saturated fat and cholesterol. Healthy choices include fresh or frozen fruits and vegetables, fish, lean meats, legumes, fat-free or low-fat dairy products, and whole-grain or high-fiber foods. ? Limit salt (sodium) if  directed by your health care provider. Sodium restriction may reduce symptoms of heart failure. Ask a dietitian to recommend heart-healthy seasonings. ? Use healthy cooking methods instead of frying. Healthy methods include roasting, grilling, broiling, baking, poaching, steaming, and stir-frying.  Limit your fluid intake if directed by your health care provider. Fluid restriction may reduce symptoms of heart failure. Lifestyle  Stop smoking or using chewing tobacco. Nicotine and tobacco can damage your heart and your blood vessels. Do not use nicotine gum or patches before talking to your health care provider.  Limit alcohol intake to no more than 1 drink per day for non-pregnant women and 2 drinks per day for men. One drink equals 12 oz of beer, 5 oz of wine, or 1 oz of hard liquor. ? Drinking more than that is harmful to your heart. Tell your health care provider if you drink alcohol several times a week. ? Talk with your health care provider about whether any level of alcohol use is safe for you. ? If your heart has already been damaged by alcohol or you have severe heart failure, drinking alcohol should be stopped completely.  Stop use of illegal drugs.  Lose weight if directed by your health care provider. Weight loss may reduce symptoms of heart failure.  Do moderate physical activity if directed by your health care provider. People who are elderly and people with severe heart failure should consult with a health care provider for physical activity recommendations.   Monitor important information  Weigh yourself every day. Keeping track of your weight daily helps you to notice excess fluid sooner. ? Weigh yourself every morning after you urinate and before you eat breakfast. ? Wear the same amount of clothing each time you weigh yourself. ? Record your daily weight. Provide your health care provider with your weight record.  Monitor and record your blood pressure as told by your health  care provider.  Check your pulse as told by your health care provider. Dealing with extreme temperatures  If the weather is extremely hot: ? Avoid vigorous physical activity. ? Use air conditioning or fans or seek a cooler location. ? Avoid caffeine and alcohol. ? Wear loose-fitting, lightweight, and light-colored clothing.  If the weather is extremely cold: ? Avoid vigorous physical activity. ? Layer your clothes. ? Wear mittens or gloves, a hat, and a scarf when you go outside. ? Avoid alcohol. General instructions  Manage other health conditions such as hypertension, diabetes, thyroid disease, or abnormal heart rhythms as told by your health care provider.  Learn to manage stress. If you need help to do this, ask your health care provider.  Plan rest periods when fatigued.  Get ongoing education and support as needed.  Participate in or seek rehabilitation as needed to maintain or improve independence and quality of life.  Stay up to date with immunizations. Keeping current on pneumococcal and influenza immunizations is especially important to prevent respiratory infections.  Keep all follow-up visits as told by your health care provider. This is important. Contact a health care provider if:  You have a rapid weight gain.  You have increasing shortness of breath that is unusual for you.  You are unable to participate in your usual physical activities.  You tire easily.  You cough more than normal, especially with physical activity.  You have any swelling or more swelling in areas such as your hands, feet, ankles, or abdomen.  You are unable to sleep because it is hard to breathe.  You feel like your heart is beating quickly (palpitations).  You become dizzy or light-headed when you stand up. Get help right away if:  You have difficulty breathing.  You notice or your family notices a change in your awareness, such as having trouble staying awake or having  difficulty with concentration.  You have pain or discomfort in your chest.  You have an episode of fainting (syncope). This information is not intended to replace advice given to you by your health care provider. Make sure you discuss any questions you have with your health care provider. Document Released: 03/15/2005 Document Revised: 11/18/2015 Document Reviewed: 10/08/2015 [employer] Interactive Patient Education  2018 [employer]  

## 2017-09-14 NOTE — Progress Notes (Signed)
Patient discharged home.  IVs removed - WNL.  Reviewed AVS and medication changes.  Verbalizes understanding - also verbalizes correct home care management for HF.  Follow up in place with cardio.  No questions at this time.  Patient in NAD - waiting for husband to arrive to pick up

## 2017-09-14 NOTE — Consult Note (Addendum)
Cardiology Consult    Patient ID: Brandy [name]; [id number]; [birth year]   Admit date: 09/13/2017 Date of Consult: 09/14/2017  Primary Care Provider: [name], MD Primary Cardiologist: Kate Sable, MD   Patient Profile    Brandy [name] is a 77 y.o. female with past medical history of chronic combined systolic and diastolic CHF (EF 60-63% by echo in 05/2017 with cath showing mild nonobstructive CAD), nonischemic cardiomyopathy, HTN, HLD, and prior CVA who is being seen today for the evaluation of CHF and elevated troponin at the request of Dr. Clementeen Graham.   History of Present Illness    Brandy [name] has experienced multiple admissions over the past several months. She was hospitalized in 05/2017 for an acute CHF exacerbation and was found to have a reduced EF of 40%.  roponin values peaked at 0.17. A cardiac catheterization was pursued for further evaluation which showed mild, nonobstructive CAD and continued medical therapy of her CHF was recommended. She was again hospitalized at [facility] from 4/16 to 07/14/2017 for an acute ischemic stroke. At the time of her follow-up with Dr. Bronson Ing on 08/24/2017, she reported having shortness of breath over the past several weeks and have been taking an extra dose of Lasix as needed for weight gain and worsening dyspnea.  Weight was stable at 167 lbs on the office scales and she was continued on Lasix 20mg  every other day. Appears Losartan was discontinued at that time and she was continued on BiDil and Coreg.   Her husband called the office on 09/13/2017 to report that she was having pain along her right upper back and the pain had been present for over 5 hours. Also reported worsening dyspnea. It was advised that his symptoms persisted, and then she should go to the ED for further evaluation.  In talking with the patient and her husband today, she reports developing a pain along her right middle back approximately 2 days ago. She is unsure if  this pain was worse with positional changes but says it does intensify with inspiration. The pain has been constant over the past 2 days and is not worse with exertion. She has noticed worsening dyspnea at rest and orthopnea during this timeframe. Reports swelling along the top of her left foot but denies any pitting edema.  Reports taking Lasix 20 mg every other day but has been taking an extra tablet several times per week.  Initial labs showed WBC 7.0, Hgb 11.8, platelets 195, Na+ 143, K+ 3.8, and creatinine 1.24 (close to baseline). BNP at 369. Initial troponin 0.17 with repeat values of 0.20 and 0.21. D-dimer elevated to 1.88. EKG shows NSR, HR 99, with LAFB and no acute ST changes when compared to prior tracings. CXR showed cardiomegaly and possible trace persistent left pleural effusion with no acute findings. CTA showed interval clearing of bilateral pleural effusions with stable cardiomegaly and no evidence of PE or dissection.  She received IV Lasix 40mg  upon admission and an additional 20mg  this AM. No I&O's have been recorded but she reports good urine output with associated improvement in her breathing.    Past Medical History:  Diagnosis Date  . Cardiomyopathy    a. EF of 25% in 07/2006 b. EF normalized by repeat echo in 2011 c. EF 35-40% by echo in 05/2017 with cath showing mild nonobstructive CAD  . Chronic combined systolic and diastolic CHF (congestive heart failure) ([place])   . Colon polyps   . Diverticulosis of colon   .  Hypertension   . Hypertensive heart disease   . Osteopenia   . Tobacco abuse    50 pack year    Past Surgical History:  Procedure Laterality Date  . BREAST BIOPSY    . CARPAL TUNNEL RELEASE     right hand  . CERVICAL FUSION    . CESAREAN SECTION     2 times  . COLONOSCOPY  8101   pt uncertain as to whether polypectomy required or performed  . ECTOPIC PREGNANCY SURGERY    . RIGHT/LEFT HEART CATH AND CORONARY ANGIOGRAPHY N/A 06/03/2017   Procedure:  RIGHT/LEFT HEART CATH AND CORONARY ANGIOGRAPHY;  Surgeon: Jettie Booze, MD;  Location: [facility];  Service: Cardiovascular;  Laterality: N/A;  . TONSILLECTOMY       Home Medications:  Prior to Admission medications   Medication Sig Start Date End Date Taking? Authorizing Provider  aspirin EC 81 MG EC tablet Take 2 tablets (162 mg total) by mouth daily. 07/14/17  Yes Barton Dubois, MD  atorvastatin (LIPITOR) 10 MG tablet TAKE 1 TABLET BY MOUTH EVERY DAY AT 6 PM 09/12/17  Yes Herminio Commons, MD  BIDIL 20-37.5 MG tablet TAKE 1 TABLET BY MOUTH THREE TIMES DAILY 09/12/17  Yes Herminio Commons, MD  carvedilol (COREG) 12.5 MG tablet Take 1 tablet (12.5 mg total) by mouth 2 (two) times daily with a meal. 08/25/17  Yes Herminio Commons, MD  cetirizine (ZYRTEC) 10 MG tablet Take 10 mg by mouth daily as needed for allergies.    Yes [provider]  cyanocobalamin (,VITAMIN B-12,) 1000 MCG/ML injection Daily for 1 week, then weekly for 1 month and then monthly (for 8 months) Patient taking differently: 1,000 mcg every 30 (thirty) days. Daily for 1 week, then weekly for 1 month and then monthly (for 8 months) 07/14/17  Yes Barton Dubois, MD  furosemide (LASIX) 20 MG tablet Take 1 tablet (20 mg total) by mouth every other day. Patient taking differently: Take 20 mg by mouth daily as needed for fluid.  07/19/17 10/17/17 Yes Herminio Commons, MD  potassium chloride SA (K-DUR,KLOR-CON) 20 MEQ tablet Take 1 tablet (20 mEq total) by mouth daily. Patient taking differently: Take 20 mEq by mouth every other day.  07/14/17  Yes Barton Dubois, MD  predniSONE (DELTASONE) 5 MG tablet Take 5 mg by mouth daily as needed (for swelling and right knee pain).   Yes [provider]    Inpatient Medications: Scheduled Meds: . aspirin EC  162 mg Oral Daily  . atorvastatin  10 mg Oral q1800  . carvedilol  12.5 mg Oral BID WC  . enoxaparin (LOVENOX) injection  40 mg Subcutaneous  Q24H  . furosemide  20 mg Intravenous Daily  . isosorbide-hydrALAZINE  1 tablet Oral TID  . loratadine  10 mg Oral Daily  . sodium chloride flush  3 mL Intravenous Q12H   Continuous Infusions: . sodium chloride     PRN Meds: sodium chloride, acetaminophen **OR** acetaminophen, sodium chloride flush, traMADol  Allergies:    Allergies  Allergen Reactions  . Lisinopril Swelling    Social History:   Social History   Socioeconomic History  . Marital status: Married    Spouse name: Not on file  . Number of children: Not on file  . Years of education: Not on file  . Highest education level: Not on file  Occupational History  . Occupation: Former Product manager: RETIRED  Social Needs  .  Financial resource strain: Not on file  . Food insecurity:    Worry: Not on file    Inability: Not on file  . Transportation needs:    Medical: Not on file    Non-medical: Not on file  Tobacco Use  . Smoking status: Former Smoker    Packs/day: 1.00    Years: 50.00    Pack years: 50.00    Types: Cigarettes    Start date: 09/02/1960    Last attempt to quit: 03/29/2008    Years since quitting: 9.4  . Smokeless tobacco: Never Used  Substance and Sexual Activity  . Alcohol use: No    Alcohol/week: 0.0 oz  . Drug use: Never  . Sexual activity: Not Currently  Lifestyle  . Physical activity:    Days per week: Not on file    Minutes per session: Not on file  . Stress: Not on file  Relationships  . Social connections:    Talks on phone: Not on file    Gets together: Not on file    Attends religious service: Not on file    Active member of club or organization: Not on file    Attends meetings of clubs or organizations: Not on file    Relationship status: Not on file  . Intimate partner violence:    Fear of current or ex partner: Not on file    Emotionally abused: Not on file    Physically abused: Not on file    Forced sexual activity: Not on file  Other Topics Concern  . Not on  file  Social History Narrative   Married with 2 adult children     Family History:    Family History  Problem Relation Age of Onset  . Hypertension Brother   . Hypertension Maternal Grandmother       Review of Systems    General:  No chills, fever, night sweats or weight changes. Positive for back pain.  Cardiovascular:  No chest pain, palpitations, paroxysmal nocturnal dyspnea. Positive for dyspnea and orthopnea.  Dermatological: No rash, lesions/masses Respiratory: No cough, dyspnea Urologic: No hematuria, dysuria Abdominal:   No nausea, vomiting, diarrhea, bright red blood per rectum, melena, or hematemesis Neurologic:  No visual changes, wkns, changes in mental status.  All other systems reviewed and are otherwise negative except as noted above.  Physical Exam/Data    Vitals:   09/13/17 2000 09/13/17 2108 09/14/17 0500 09/14/17 0711  BP: (!) 145/80 122/62  140/70  Pulse: (!) 102 91  85  Resp: 17     Temp:  98.6 F (37 C)  99.2 F (37.3 C)  TempSrc:  Oral  Oral  SpO2: 96% 97%  100%  Weight:  159 lb 13.3 oz (72.5 kg) 166 lb 3.6 oz (75.4 kg)   Height:       No intake or output data in the 24 hours ending 09/14/17 1018 Filed Weights   09/13/17 1630 09/13/17 2108 09/14/17 0500  Weight: 165 lb (74.8 kg) 159 lb 13.3 oz (72.5 kg) 166 lb 3.6 oz (75.4 kg)   Body mass index is 28.09 kg/m.   General: Pleasant, African American female appearing in NAD Psych: Normal affect. Neuro: Alert and oriented X 3. Moves all extremities spontaneously. HEENT: Normal  Neck: Supple without bruits or JVD. Lungs:  Resp regular and unlabored, CTA without wheezing or rales. Heart: RRR no s3, s4, or murmurs. Abdomen: Soft, non-tender, non-distended, BS + x 4.  Extremities: No clubbing, cyanosis or edema.  DP/PT/Radials 2+ and equal bilaterally.   EKG:  The EKG was personally reviewed and demonstrates: NSR, HR 99, with LAFB and no acute ST changes when compared to prior  tracings.   Labs/Studies     Relevant CV Studies:  Echocardiogram: 06/03/2017 Study Conclusions  - Left ventricle: The cavity size was normal. Wall thickness was   increased in a pattern of moderate LVH. Systolic function was   moderately reduced. The estimated ejection fraction was in the   range of 35% to 40%. Diffuse hypokinesis. Doppler parameters are   consistent with restrictive left ventricular relaxation (grade 3   diastolic dysfunction). The E/e&' ratio is >20, suggesting   markedly elevated LV filling pressure. - Mitral valve: Mildly thickened leaflets . There was moderate   regurgitation. - Left atrium: The atrium was moderately dilated. - Right ventricle: The cavity size was normal. Mildly reduced   systolic function. - Tricuspid valve: There was mild regurgitation. - Pulmonary arteries: PA peak pressure: 38 mm Hg (S). - Inferior vena cava: The vessel was dilated. The respirophasic   diameter changes were blunted (< 50%), consistent with elevated   central venous pressure. - Pericardium, extracardiac: A trivial pericardial effusion was   identified. There was a left pleural effusion.  Impressions:  - Compared to a prior study in 04/2017, the LVEF is lower at 35-40%.   There is grade 3 DD with high LV filling pressure. A trivial   pericardial effusion and left pleural effusion is noted.  Cardiac Catheterization: 05/2017  Ost 2nd Diag to 2nd Diag lesion is 25% stenosed.  Mid RCA lesion is 25% stenosed.  LV end diastolic pressure is moderately elevated.  There is no aortic valve stenosis.  LV end diastolic pressure is normal.  Hemodynamic findings consistent with mild pulmonary hypertension.  CO 5.2 L/min; CI 2.86; Ao 94%; PA sat 65%, mean PA 29 mm Hg; PCWP 20 mm Hg   Mild, nonobstructive CAD.   Mildly elevated right heart pressures with volume overload.   Continue medical therapy.  EF not assessed to minimize contrast exposure.  Limited  Echo: 06/2017 Study Conclusions  - Left ventricle: The cavity size was normal. Wall thickness was   increased in a pattern of moderate LVH. Systolic function was   mildly to moderately reduced. The estimated ejection fraction was   = 40%. Diffuse hypokinesis. - Aortic valve: Mildly calcified annulus. Trileaflet; normal   thickness leaflets. Valve area (VTI): 1.88 cm^2. Valve area   (Vmax): 2.07 cm^2. - Mitral valve: Mildly calcified annulus. Normal thickness leaflets   . There was mild to moderate regurgitation. The MR VC is 0.3 cm. - Left atrium: The atrium was moderately dilated. - Atrial septum: No defect or patent foramen ovale was identified.  Laboratory Data:  Chemistry Recent Labs  Lab 09/13/17 1631 09/14/17 0215  NA 143 141  K 3.8 3.0*  CL 106 102  CO2 30 30  GLUCOSE 91 98  BUN 18 17  CREATININE 1.24* 1.11*  CALCIUM 8.7* 8.5*  GFRNONAA 41* 47*  GFRAA 48* 55*  ANIONGAP 7 9    Recent Labs  Lab 09/14/17 0215  PROT 6.3*  ALBUMIN 3.4*  AST 13*  ALT 8*  ALKPHOS 54  BILITOT 0.5   Hematology Recent Labs  Lab 09/13/17 1631 09/14/17 0215  WBC 7.0 7.7  RBC 4.21 3.99  HGB 11.8* 11.0*  HCT 38.1 35.8*  MCV 90.5 89.7  MCH 28.0 27.6  MCHC 31.0 30.7  RDW 13.5 13.5  PLT 195 191   Cardiac Enzymes Recent Labs  Lab 09/13/17 1631 09/13/17 2109 09/14/17 0215 09/14/17 0831  TROPONINI 0.17* 0.20* 0.21* 0.24*   No results for input(s): TROPIPOC in the last 168 hours.  BNP Recent Labs  Lab 09/13/17 1631  BNP 369.0*    DDimer  Recent Labs  Lab 09/13/17 1631  DDIMER 1.88*    Radiology/Studies:  Dg Chest 2 View  Result Date: 09/13/2017 CLINICAL DATA:  Shortness of breath, lower extremity edema, and nonproductive cough. EXAM: CHEST - 2 VIEW COMPARISON:  07/12/2017 FINDINGS: The cardiac silhouette remains mildly enlarged. There may be a persistent trace left pleural effusion. No airspace consolidation, edema, or pneumothorax is identified. Prior cervical  spinal fusion is noted. IMPRESSION: Cardiomegaly and possible trace persistent left pleural effusion. No evidence of acute airspace disease. Electronically Signed   By: Logan Bores M.D.   On: 09/13/2017 18:39   Ct Angio Chest Pe W Or Wo Contrast  Result Date: 09/14/2017 CLINICAL DATA:  Worsening of dyspnea and leg swelling. Right-sided chest pain. EXAM: CT ANGIOGRAPHY CHEST WITH CONTRAST TECHNIQUE: Multidetector CT imaging of the chest was performed using the standard protocol during bolus administration of intravenous contrast. Multiplanar CT image reconstructions and MIPs were obtained to evaluate the vascular anatomy. CONTRAST:  86mL ISOVUE-370 IOPAMIDOL (ISOVUE-370) INJECTION 76% COMPARISON:  06/03/2017 FINDINGS: Cardiovascular: Common origin of the right brachiocephalic and left common carotid arteries. No significant stenosis. Mild atherosclerosis at the origins of the great vessels. Ectatic minimally atherosclerotic nonaneurysmal thoracic aorta. No dissection. Satisfactory opacification of the pulmonary arteries without acute pulmonary embolus. Heart size is enlarged without pericardial effusion. Minimal coronary arteriosclerosis. Mediastinum/Nodes: No enlarged mediastinal, hilar, or axillary lymph nodes. Thyroid gland, trachea, and esophagus demonstrate no significant findings. Lungs/Pleura: Minimal subsegmental atelectasis in the left lower lobe and adjacent to the left heart border. No acute pulmonary consolidation, effusion or pneumothorax. No dominant mass. Interval clearing of bilateral pleural effusions. Upper Abdomen: No acute upper abdominal abnormality. Cyst or hemangioma in the posterior aspect of the right hepatic lobe measuring 14 mm. Reflux of contrast into the hepatic veins. Musculoskeletal: ACDF of the lower cervical spine. Levoscoliosis of the thoracic spine. No aggressive osseous lesions. No acute osseous abnormality. Review of the MIP images confirms the above findings. IMPRESSION: 1.  There has been interval clearing of bilateral pleural effusions since prior exam. No active pulmonary disease is noted. 2. Stable cardiomegaly with ectatic thoracic aorta.  No dissection. 3. No acute pulmonary embolus. 4. Minimal coronary arteriosclerosis. Aortic Atherosclerosis (ICD10-I70.0). Electronically Signed   By: Ashley Royalty M.D.   On: 09/14/2017 02:01   US Venous Img Lower  Left (dvt Study)  Result Date: 09/13/2017 CLINICAL DATA:  Left lower extremity pain and edema for the past day. Evaluate for DVT. EXAM: LEFT LOWER EXTREMITY VENOUS DOPPLER ULTRASOUND TECHNIQUE: Gray-scale sonography with graded compression, as well as color Doppler and duplex ultrasound were performed to evaluate the lower extremity deep venous systems from the level of the common femoral vein and including the common femoral, femoral, profunda femoral, popliteal and calf veins including the posterior tibial, peroneal and gastrocnemius veins when visible. The superficial great saphenous vein was also interrogated. Spectral Doppler was utilized to evaluate flow at rest and with distal augmentation maneuvers in the common femoral, femoral and popliteal veins. COMPARISON:  None. FINDINGS: Contralateral Common Femoral Vein: Respiratory phasicity is normal and symmetric with the symptomatic side. No evidence of thrombus. Normal compressibility. Common Femoral Vein: No evidence of thrombus.  Normal compressibility, respiratory phasicity and response to augmentation. Saphenofemoral Junction: No evidence of thrombus. Normal compressibility and flow on color Doppler imaging. Profunda Femoral Vein: No evidence of thrombus. Normal compressibility and flow on color Doppler imaging. Femoral Vein: No evidence of thrombus. Normal compressibility, respiratory phasicity and response to augmentation. Popliteal Vein: No evidence of thrombus. Normal compressibility, respiratory phasicity and response to augmentation. Calf Veins: No evidence of thrombus.  Normal compressibility and flow on color Doppler imaging. Superficial Great Saphenous Vein: No evidence of thrombus. Normal compressibility. Venous Reflux:  None. Other Findings:  None. IMPRESSION: No evidence of DVT within the left lower extremity. Electronically Signed   By: Sandi Mariscal M.D.   On: 09/13/2017 17:47     Assessment & Plan    1. Chronic Combined Systolic and Diastolic CHF/ Nonischemic Cardiomyopathy - the patient has a known reduced EF 35-40% by echo in 05/2017 with cath at that time showing mild nonobstructive CAD. Presents for evaluation of worsening dyspnea and orthopnea with associated right-sided back pain. Says weight has overall been stable on her home scales. - initial labs show BNP at 369 (improved compared to prior admission). D-dimer elevated to 1.88. CXR showed cardiomegaly and possible trace persistent left pleural effusion with no acute findings. CTA showed interval clearing of bilateral pleural effusions with stable cardiomegaly and no evidence of PE or dissection. - she has received two doses of IV Lasix and reports improvement in her symptoms. She was only on Lasix 20mg  every other day prior to admission but was taking extra tablets intermittently. Would recommend she be on Lasix 20mg  daily. Will arrange for a repeat BMET in 2 weeks following diuretic dose adjustment.  - continue Coreg and BiDil. She was previously on Losartan but this was discontinued at her office visit in 07/2017. Will defer to her Primary Cardiologist regarding restarting this as her EF is reduced at 40% by recent echo imaging.   2. Elevated Troponin/ Atypical Pain -  Cyclic troponin values have been flat at 0.17, 0.20 and 0.21. EKG shows no acute ischemic changes. Given that her catheterization 3 months ago showed mild, nonobstructive CAD and troponin values this admission are consistent with prior admissions over the past several months, would not pursue repeat ischemic evaluation at this time. Her  right-sided back pain seems most consistent with a pleuritic etiology as this is worse with deep inspiration. A CTA was performed on admission and negative for PE.  - continue ASA, BB, and statin therapy.   3. HTN - BP has been stable at 122/62 - 145/89 since admission. - continue PTA Coreg 12.5mg  BID and BiDil 20-37.5mg  TID.   4. HLD - FLP shows total cholesterol 88, triglycerides 37, HDL 32, and LDL 49. At goal of LDL less than 70. - Continue Atorvastatin 10 mg daily.  5. Stage 3 CKD - baseline creatinine of 1.1 - 1.2. Stable at 1.11 this AM. Will arrange for a repeat BMET in 2 weeks following Lasix dose adjustment.    For questions or updates, please contact [facility] Please consult [email or web address] for contact info under Cardiology/STEMI.  Signed, Erma Heritage, PA-C 09/14/2017, 10:18 AM Pager: [phone]  The patient was seen and examined, and I agree with the history, physical exam, assessment and plan as documented above, with modifications as noted below. I have also personally reviewed all relevant documentation, old records, labs, and both radiographic and cardiovascular studies. I have also independently interpreted old and new ECG's.  Brandy [name] is well-known to  me from the outpatient setting.  I last saw her on 08/24/2017.  She has a history of nonobstructive coronary disease, chronic combined systolic and diastolic heart failure, nonischemic cardiomyopathy, chronic kidney disease stage III, hypertension, and stroke.  She has most recently been maintained on carvedilol 12.5 mg twice daily, BiDil 20-37.5 mg 3 times daily, and Lasix 20 mg every other day due to fluctuating renal function.  She has not been on ACE inhibitors or angiotensin receptor blockers due to a prior history of angioedema with lisinopril.  On the night before last, she had some back discomfort on the right side of her back with increasing dyspnea.  It was made worse with deep breathing.  She also  developed some left foot swelling and took an extra Lasix without relief.  She has received a total of IV Lasix 60 mg and is feeling much better and actually wants to go home.  She denies exertional chest pain.  She does have significant right sided back pain and is not certain if it is worse with changes in position but it is certainly made worse with deep breathing.  Potassium is low this morning at a level of 3.  Troponins have been nonspecifically elevated as noted above.  She underwent coronary angiography earlier this year which showed nonobstructive disease as noted above.  I agree with increasing outpatient Lasix dose to 20 mg daily.  She may require alternate daily dosing with 40/20 mg if renal function will allow given cardiorenal syndrome.  Her back pain is pleuritic and also given her present hypokalemia, the musculoskeletal component will only be worsened.  I will replace potassium.  I discussed this with the patient and her husband as well as her brother, Randel Pigg, I retired [employer] representative who lives in [place].   Kate Sable, MD, [facility]  09/14/2017 11:14 AM

## 2017-09-14 NOTE — Discharge Summary (Signed)
Physician Discharge Summary  Brandy [name] GXQ:[id number] DOB: 1940/06/15 DOA: 09/13/2017  PCP: [name], MD  Admit date: 09/13/2017 Discharge date: 09/14/2017  Admitted From: Home Disposition: Home  Recommendations for Outpatient Follow-up:  Follow-up with cardiology as scheduled outpatient  Home Health:None Equipment/Devices: None  Discharge Condition: Fair CODE STATUS: Full code Diet recommendation: Heart Healthy     Discharge Diagnoses:  Principal Problem:   Precordial chest pain  Active Problems:   Elevated troponin   Anemia   B12 deficiency   Tachycardia   Non-ischemic cardiomyopathy ([place]) Hypokalemia  Brief narrative/HPI 77 year old female with chronic combined systolic and diastolic CHF with EF of 14% as per last echo in April of this year, cardiac cath in March showing nonobstructive CAD, hypertension, prior CVA and hyperlipidemia presented with right upper back pain and some shortness of breath at home.  He also had elevated troponin peaked at 0.21 without any EKG changes. Patient observed for further management.  Hospital course Atypical chest pain Appears pleuritic versus musculoskeletal, localized to right upper/mid back and reproducible.  Troponin peaked at 0.21.  No EKG changes and stable on telemetry.  CT angiogram done for elevated d-dimer, was negative for PE.  Doppler lower extremity was negative for DVT as well.  Given her reassuring recent cardiac cath no further work-up is needed at present.  Appreciate cardiology consult.  Nonischemic cardiomyopathy Patient reports off-and-on shortness of breath on exertion.  BNP of 389.  No clinical signs of volume overload.  She is on Lasix 20 mg every other day.  Instructed to be on once daily.  Cardiology recommends she may need to be on a dose of 40/20 mg on alternating days if she has symptoms on daily Lasix.  Also instructed to take 40 mg Lasix if she gains more than 3 pounds in a single day or >5 pounds in 1  week and take an extra dose of potassium as well.  Patient instructed to weigh herself daily and monitor her sodium and fluid intake.  Home health instructions for heart failure have been provided. Continue aspirin, beta-blocker and BiDil.  Continue statin.  Not on ACE/ARB due to angioedema with lisinopril. Outpatient follow-up with cardiology.  Hypokalemia Replenished.  Continue home potassium regimen.  B12 deficiency Continue monthly B12 injections.  Remaining medical issues are stable.  She can be discharged home with outpatient follow-up.  Procedure: CT angiogram of the chest, Doppler lower extremity  Consults: Cardiology Family communication: Husband at bedside  Discharge Instructions   Allergies as of 09/14/2017      Reactions   Lisinopril Swelling      Medication List    TAKE these medications   aspirin 81 MG EC tablet Take 2 tablets (162 mg total) by mouth daily.   atorvastatin 10 MG tablet Commonly known as:  LIPITOR TAKE 1 TABLET BY MOUTH EVERY DAY AT 6 PM   BIDIL 20-37.5 MG tablet Generic drug:  isosorbide-hydrALAZINE TAKE 1 TABLET BY MOUTH THREE TIMES DAILY   carvedilol 12.5 MG tablet Commonly known as:  COREG Take 1 tablet (12.5 mg total) by mouth 2 (two) times daily with a meal.   cetirizine 10 MG tablet Commonly known as:  ZYRTEC Take 10 mg by mouth daily as needed for allergies.   cyanocobalamin 1000 MCG/ML injection Commonly known as:  (VITAMIN B-12) Daily for 1 week, then weekly for 1 month and then monthly (for 8 months) What changed:    how much to take  when to take this  additional  instructions   furosemide 20 MG tablet Commonly known as:  LASIX Take 1 tablet (20 mg total) by mouth daily. What changed:  when to take this   potassium chloride SA 20 MEQ tablet Commonly known as:  K-DUR,KLOR-CON Take 1 tablet (20 mEq total) by mouth daily. What changed:  when to take this   predniSONE 5 MG tablet Commonly known as:   DELTASONE Take 5 mg by mouth daily as needed (for swelling and right knee pain).      Follow-up Information    Herminio Commons, MD Follow up.   Specialty:  Cardiology Why:  You will need a repeat Basic Metabolic Panel within 2 weeks to assess kidney function and potassium levels. This can be obtained at the lab at [facility]. Follow-up with Dr. Bronson Ing has been arranged.  Contact information: [place] [address] [phone]        [name], MD. Schedule an appointment as soon as possible for a visit in 2 week(s).   Specialty:  Family Medicine Contact information: [address] [address] [place] [address] [phone]          Allergies  Allergen Reactions  . Lisinopril Swelling        Procedures/Studies: Dg Chest 2 View  Result Date: 09/13/2017 CLINICAL DATA:  Shortness of breath, lower extremity edema, and nonproductive cough. EXAM: CHEST - 2 VIEW COMPARISON:  07/12/2017 FINDINGS: The cardiac silhouette remains mildly enlarged. There may be a persistent trace left pleural effusion. No airspace consolidation, edema, or pneumothorax is identified. Prior cervical spinal fusion is noted. IMPRESSION: Cardiomegaly and possible trace persistent left pleural effusion. No evidence of acute airspace disease. Electronically Signed   By: Logan Bores M.D.   On: 09/13/2017 18:39   Ct Angio Chest Pe W Or Wo Contrast  Result Date: 09/14/2017 CLINICAL DATA:  Worsening of dyspnea and leg swelling. Right-sided chest pain. EXAM: CT ANGIOGRAPHY CHEST WITH CONTRAST TECHNIQUE: Multidetector CT imaging of the chest was performed using the standard protocol during bolus administration of intravenous contrast. Multiplanar CT image reconstructions and MIPs were obtained to evaluate the vascular anatomy. CONTRAST:  21mL ISOVUE-370 IOPAMIDOL (ISOVUE-370) INJECTION 76% COMPARISON:  06/03/2017 FINDINGS: Cardiovascular: Common origin of the right brachiocephalic and left  common carotid arteries. No significant stenosis. Mild atherosclerosis at the origins of the great vessels. Ectatic minimally atherosclerotic nonaneurysmal thoracic aorta. No dissection. Satisfactory opacification of the pulmonary arteries without acute pulmonary embolus. Heart size is enlarged without pericardial effusion. Minimal coronary arteriosclerosis. Mediastinum/Nodes: No enlarged mediastinal, hilar, or axillary lymph nodes. Thyroid gland, trachea, and esophagus demonstrate no significant findings. Lungs/Pleura: Minimal subsegmental atelectasis in the left lower lobe and adjacent to the left heart border. No acute pulmonary consolidation, effusion or pneumothorax. No dominant mass. Interval clearing of bilateral pleural effusions. Upper Abdomen: No acute upper abdominal abnormality. Cyst or hemangioma in the posterior aspect of the right hepatic lobe measuring 14 mm. Reflux of contrast into the hepatic veins. Musculoskeletal: ACDF of the lower cervical spine. Levoscoliosis of the thoracic spine. No aggressive osseous lesions. No acute osseous abnormality. Review of the MIP images confirms the above findings. IMPRESSION: 1. There has been interval clearing of bilateral pleural effusions since prior exam. No active pulmonary disease is noted. 2. Stable cardiomegaly with ectatic thoracic aorta.  No dissection. 3. No acute pulmonary embolus. 4. Minimal coronary arteriosclerosis. Aortic Atherosclerosis (ICD10-I70.0). Electronically Signed   By: Ashley Royalty M.D.   On: 09/14/2017 02:01  US Venous Img Lower  Left (dvt Study)  Result Date: 09/13/2017 CLINICAL DATA:  Left lower extremity pain and edema for the past day. Evaluate for DVT. EXAM: LEFT LOWER EXTREMITY VENOUS DOPPLER ULTRASOUND TECHNIQUE: Gray-scale sonography with graded compression, as well as color Doppler and duplex ultrasound were performed to evaluate the lower extremity deep venous systems from the level of the common femoral vein and including  the common femoral, femoral, profunda femoral, popliteal and calf veins including the posterior tibial, peroneal and gastrocnemius veins when visible. The superficial great saphenous vein was also interrogated. Spectral Doppler was utilized to evaluate flow at rest and with distal augmentation maneuvers in the common femoral, femoral and popliteal veins. COMPARISON:  None. FINDINGS: Contralateral Common Femoral Vein: Respiratory phasicity is normal and symmetric with the symptomatic side. No evidence of thrombus. Normal compressibility. Common Femoral Vein: No evidence of thrombus. Normal compressibility, respiratory phasicity and response to augmentation. Saphenofemoral Junction: No evidence of thrombus. Normal compressibility and flow on color Doppler imaging. Profunda Femoral Vein: No evidence of thrombus. Normal compressibility and flow on color Doppler imaging. Femoral Vein: No evidence of thrombus. Normal compressibility, respiratory phasicity and response to augmentation. Popliteal Vein: No evidence of thrombus. Normal compressibility, respiratory phasicity and response to augmentation. Calf Veins: No evidence of thrombus. Normal compressibility and flow on color Doppler imaging. Superficial Great Saphenous Vein: No evidence of thrombus. Normal compressibility. Venous Reflux:  None. Other Findings:  None. IMPRESSION: No evidence of DVT within the left lower extremity. Electronically Signed   By: Sandi Mariscal M.D.   On: 09/13/2017 17:47       Subjective:   Discharge Exam: Vitals:   09/13/17 2108 09/14/17 0711  BP: 122/62 140/70  Pulse: 91 85  Resp:    Temp: 98.6 F (37 C) 99.2 F (37.3 C)  SpO2: 97% 100%   Vitals:   09/13/17 2000 09/13/17 2108 09/14/17 0500 09/14/17 0711  BP: (!) 145/80 122/62  140/70  Pulse: (!) 102 91  85  Resp: 17     Temp:  98.6 F (37 C)  99.2 F (37.3 C)  TempSrc:  Oral  Oral  SpO2: 96% 97%  100%  Weight:  72.5 kg (159 lb 13.3 oz) 75.4 kg (166 lb 3.6 oz)    Height:        General: Not in distress HEENT: Moist mucosa, supple neck Chest: Clear bilaterally, tender to pressure over the right mid back CVS: Normal S1 and S2, no murmurs GI: Soft, nondistended, nontender Musculoskeletal: Warm, no edema     The results of significant diagnostics from this hospitalization (including imaging, microbiology, ancillary and laboratory) are listed below for reference.     Microbiology: No results found for this or any previous visit (from the past 240 hour(s)).   Labs: BNP (last 3 results) Recent Labs    06/02/17 1514 07/12/17 1759 09/13/17 1631  BNP 806.0* 642.0* 151.7*   Basic Metabolic Panel: Recent Labs  Lab 09/13/17 1631 09/14/17 0215  NA 143 141  K 3.8 3.0*  CL 106 102  CO2 30 30  GLUCOSE 91 98  BUN 18 17  CREATININE 1.24* 1.11*  CALCIUM 8.7* 8.5*   Liver Function Tests: Recent Labs  Lab 09/14/17 0215  AST 13*  ALT 8*  ALKPHOS 54  BILITOT 0.5  PROT 6.3*  ALBUMIN 3.4*   No results for input(s): LIPASE, AMYLASE in the last 168 hours. No results for input(s): AMMONIA in the last 168 hours. CBC: Recent Labs  Lab 09/13/17 1631 09/14/17 0215  WBC 7.0 7.7  HGB 11.8* 11.0*  HCT 38.1 35.8*  MCV 90.5 89.7  PLT 195 191   Cardiac Enzymes: Recent Labs  Lab 09/13/17 1631 09/13/17 2109 09/14/17 0215 09/14/17 0831  TROPONINI 0.17* 0.20* 0.21* 0.24*   BNP: Invalid input(s): POCBNP CBG: No results for input(s): GLUCAP in the last 168 hours. D-Dimer Recent Labs    09/13/17 1631  DDIMER 1.88*   Hgb A1c No results for input(s): HGBA1C in the last 72 hours. Lipid Profile Recent Labs    09/14/17 0215  CHOL 88  HDL 32*  LDLCALC 49  TRIG 37  CHOLHDL 2.8   Thyroid function studies Recent Labs    09/13/17 1631  TSH 0.590   Anemia work up No results for input(s): VITAMINB12, FOLATE, FERRITIN, TIBC, IRON, RETICCTPCT in the last 72 hours. Urinalysis    Component Value Date/Time   COLORURINE YELLOW  07/12/2017 1754   APPEARANCEUR CLEAR 07/12/2017 1754   LABSPEC 1.016 07/12/2017 1754   PHURINE 7.0 07/12/2017 1754   GLUCOSEU NEGATIVE 07/12/2017 1754   HGBUR NEGATIVE 07/12/2017 1754   BILIRUBINUR NEGATIVE 07/12/2017 1754   KETONESUR NEGATIVE 07/12/2017 1754   PROTEINUR NEGATIVE 07/12/2017 1754   NITRITE NEGATIVE 07/12/2017 1754   LEUKOCYTESUR NEGATIVE 07/12/2017 1754   Sepsis Labs Invalid input(s): PROCALCITONIN,  WBC,  LACTICIDVEN Microbiology No results found for this or any previous visit (from the past 240 hour(s)).   Time coordinating discharge: < 30 minutes  SIGNED:   Louellen Molder, MD  [facility] 09/14/2017, 11:42 AM Pager   If 7PM-7AM, please contact night-coverage [email or web address] Password [email or web address]

## 2017-09-15 ENCOUNTER — Encounter (HOSPITAL_COMMUNITY): Payer: Medicare Other | Admitting: Speech Pathology

## 2017-09-20 ENCOUNTER — Encounter (HOSPITAL_COMMUNITY): Payer: Self-pay | Admitting: Speech Pathology

## 2017-09-20 ENCOUNTER — Ambulatory Visit (HOSPITAL_COMMUNITY): Payer: Medicare Other | Admitting: Speech Pathology

## 2017-09-20 DIAGNOSIS — R41841 Cognitive communication deficit: Secondary | ICD-10-CM

## 2017-09-20 NOTE — Therapy (Signed)
[facility] [place], [place], [address] Phone: [phone]   Fax:  [phone]  Speech Language Pathology Treatment  Patient Details  Name: Brandy [name] MRN: [id number] Date of Birth: Nov 22, 1940 Referring Provider: Barton Dubois   Encounter Date: 09/20/2017  End of Session - 09/20/17 1611    Visit Number  6    Number of Visits  7    Date for SLP Re-Evaluation  09/22/17    Authorization Type  [employer] Medicare based on medical necessity    SLP Start Time  1536    SLP Stop Time   1603    SLP Time Calculation (min)  27 min    Activity Tolerance  Patient tolerated treatment well       Past Medical History:  Diagnosis Date  . Cardiomyopathy    a. EF of 25% in 07/2006 b. EF normalized by repeat echo in 2011 c. EF 35-40% by echo in 05/2017 with cath showing mild nonobstructive CAD  . Chronic combined systolic and diastolic CHF (congestive heart failure) ([place])   . Colon polyps   . Diverticulosis of colon   . Hypertension   . Hypertensive heart disease   . Osteopenia   . Tobacco abuse    50 pack year    Past Surgical History:  Procedure Laterality Date  . BREAST BIOPSY    . CARPAL TUNNEL RELEASE     right hand  . CERVICAL FUSION    . CESAREAN SECTION     2 times  . COLONOSCOPY  5697   pt uncertain as to whether polypectomy required or performed  . ECTOPIC PREGNANCY SURGERY    . RIGHT/LEFT HEART CATH AND CORONARY ANGIOGRAPHY N/A 06/03/2017   Procedure: RIGHT/LEFT HEART CATH AND CORONARY ANGIOGRAPHY;  Surgeon: Jettie Booze, MD;  Location: [facility];  Service: Cardiovascular;  Laterality: N/A;  . TONSILLECTOMY      There were no vitals filed for this visit.  Subjective Assessment - 09/20/17 1608    Subjective  "I have been doing well."    Patient is accompained by:  Family member    Currently in Pain?  No/denies       ADULT SLP TREATMENT - 09/20/17 1609      General Information   Behavior/Cognition  Alert;Cooperative;Pleasant mood    Patient Positioning  Upright in chair    Oral care provided  N/A    HPI  Brandy [name] is a 77 y.o. female with medical history significant of dilated cardiomyopathy, chronic combined systolic and diastolic congestive heart failure, history of colon polyps and diverticuli, hypertension, hypertensive heart disease, osteopenia, history of tobacco abuse in the past (50-pack-year/quit in 2010) who presented to the ED on 07/12/2017 for evaluation of fall, confusion and slurred speech. MRI showed: Left M2 occlusion with comparatively small acute infarct along the lower left insula and external capsule. Small subacute infarct along the lateral left frontal cortex. 3 mm left superior hypophyseal artery region ICA aneurysm. Negative neck MRA.  No stenosis or embolic source seen. Mild chronic small vessel ischemia in the cerebral white matter. Pt was given MoCA in acute setting with a score of 23/30 with recommendation for follow up outpatient SLP services. She is referred for outpatient SLP by Dr. Barton Dubois.        Treatment Provided   Treatment provided  Cognitive-Linquistic      Pain Assessment   Pain Assessment  No/denies pain      Cognitive-Linquistic  Treatment   Treatment focused on  Aphasia;Cognition;Patient/family/caregiver education    Skilled Treatment  Skilled SLP treatment focused on targeting fluency enhancing strategies and word finding strategies in conversation      Assessment / Recommendations / Plan   Plan  Discharge SLP treatment due to (comment)         SLP Short Term Goals - 09/20/17 1615      SLP SHORT TERM GOAL #1   Title  Implement word finding strategies in structured tasks with min assist.    Baseline  dysnomia noted in complex conversations    Time  4    Period  Weeks    Status  Achieved      SLP SHORT TERM GOAL #2   Title  Complete moderate level divergent naming tasks with 90% acc when given min cues.     Baseline  7 /s/ words for 65%    Time  4    Period  Weeks    Status  Achieved      SLP SHORT TERM GOAL #3   Title  Provide verbal description of object using at least 3 descriptives (function, size/shape, location) when given min cues.    Baseline  Introduced    Time  4    Period  Weeks    Status  Achieved      SLP SHORT TERM GOAL #4   Title  Pt will completed functional memory activities with 100% acc with use of memory strategies and min cues.     Baseline  75%    Time  4    Period  Weeks    Status  Achieved      SLP SHORT TERM GOAL #5   Title  Pt will implement fluency enhancing strategies during conversational speech with min cues.    Baseline  mod cues    Time  3    Period  Weeks    Status  Achieved       SLP Long Term Goals - 07/27/17 1422      SLP LONG TERM GOAL #1   Title  same as short       Plan - 09/20/17 1615    Clinical Impression Statement Pt accompanied to therapy by her spouse. She reports that she feels like she is doing better in regards to her speech. Pt answered complex open ended questions in therapy with 100% fluency. Oral reading of paragraph material was 100% acc, but with increased rate of speech. SLP reinforced slowed rate of speech with pause when dysfluency occurs. No further SLP services indicated at this time. Pt in agreement with plan for d/c from services.    Speech Therapy Frequency  1x /week    Duration  4 weeks    Treatment/Interventions  SLP instruction and feedback;Cognitive reorganization;Compensatory strategies;Compensatory techniques;Patient/family education;Language facilitation;Multimodal communcation approach    Potential to Achieve Goals  Good    SLP Home Exercise Plan  Complete HEP as assigned to facilitate carry over of treatment techniques and strategies to home/community environment.    Consulted and Agree with Plan of Care  Patient       Patient will benefit from skilled therapeutic intervention in order to improve the  following deficits and impairments:   Cognitive communication deficit    Problem List Patient Active Problem List   Diagnosis Date Noted  . Non-ischemic cardiomyopathy ([place]) 09/14/2017  . Precordial chest pain 09/14/2017  . B12 deficiency 09/13/2017  . Tachycardia 09/13/2017  . Acute ischemic  stroke ([place])   . Seasonal allergies 07/13/2017  . TIA (transient ischemic attack) 07/12/2017  . Anemia 07/12/2017  . Pneumonia 06/02/2017  . Pleural effusion 06/02/2017  . Acute on chronic systolic congestive heart failure ([place])   . Elevated troponin   . Other fatigue 05/04/2017  . Dyspnea 05/04/2017  . Hypertensive heart disease   . Chronic combined systolic and diastolic CHF (congestive heart failure) ([place])   . Hypertension   . HTN (hypertension) 01/22/2013  . TOBACCO ABUSE 02/18/2008  . CARDIOMYOPATHY 02/18/2008  . DIVERTICULOSIS OF COLON 02/18/2008  . OSTEOPENIA 02/18/2008   SPEECH THERAPY DISCHARGE SUMMARY  Visits from Start of Care: 6  Current functional level related to goals / functional outcomes: Met   Remaining deficits: Occasional mild verbal dysfluencies when fatigued   Education / Equipment: Completed  Plan: Patient agrees to discharge.  Patient goals were met. Patient is being discharged due to meeting the stated rehab goals.  ?????         Thank you,  Genene Churn, [place]  [facility] 09/20/2017, 4:20 PM  [facility] [address] [place], [place], [address] Phone: [phone]   Fax:  [phone]   Name: Brandy [name] MRN: [id number] Date of Birth: 01-09-1941

## 2017-09-28 ENCOUNTER — Emergency Department (HOSPITAL_COMMUNITY)
Admission: EM | Admit: 2017-09-28 | Discharge: 2017-09-28 | Disposition: A | Discharge status: Home / Self Care | Payer: Medicare Other | Attending: Emergency Medicine | Admitting: Emergency Medicine

## 2017-09-28 ENCOUNTER — Encounter (HOSPITAL_COMMUNITY): Payer: Self-pay | Admitting: Emergency Medicine

## 2017-09-28 ENCOUNTER — Other Ambulatory Visit: Payer: Self-pay

## 2017-09-28 DIAGNOSIS — Z5321 Procedure and treatment not carried out due to patient leaving prior to being seen by health care provider: Secondary | ICD-10-CM | POA: Insufficient documentation

## 2017-09-28 HISTORY — DX: Transient cerebral ischemic attack, unspecified: G45.9

## 2017-09-28 NOTE — ED Triage Notes (Signed)
Pt wishes to be seen for an EKG, states her brother is a Doctor, hospital and wants her to have one performed prior to leaving town. Family noticed pt was missing notes that she never has while playing on Tuesday. Pt denies any sx and states she would just like an EKG.

## 2017-09-28 NOTE — ED Notes (Signed)
Pt leaving per family.

## 2017-10-10 ENCOUNTER — Ambulatory Visit: Payer: Medicare Other | Admitting: Cardiology

## 2017-10-20 ENCOUNTER — Encounter: Payer: Self-pay | Admitting: Pulmonary Disease

## 2017-10-20 ENCOUNTER — Ambulatory Visit: Payer: Medicare Other | Admitting: Pulmonary Disease

## 2017-10-20 VITALS — BP 124/79 | HR 74 | Ht 64.5 in | Wt 158.0 lb

## 2017-10-20 DIAGNOSIS — I5022 Chronic systolic (congestive) heart failure: Secondary | ICD-10-CM | POA: Diagnosis not present

## 2017-10-20 DIAGNOSIS — J984 Other disorders of lung: Secondary | ICD-10-CM | POA: Diagnosis not present

## 2017-10-20 DIAGNOSIS — R0600 Dyspnea, unspecified: Secondary | ICD-10-CM

## 2017-10-20 MED ORDER — UMECLIDINIUM-VILANTEROL 62.5-25 MCG/INH IN AEPB: 1.0000 | INHALATION_SPRAY | Freq: Every day | RESPIRATORY_TRACT | 0 refills | Status: DC

## 2017-10-20 NOTE — Progress Notes (Signed)
Synopsis: Referred in July 2019 for Dyspea  Subjective:   PATIENT ID: Brandy [name] GENDER: female DOB: January 05, 1941, MRN: [id number]   HPI  Chief Complaint  Patient presents with  . Consult    referred for sob. pt was recently treated for CHF, advised to follow up with our clinic.  denies any current breathing complaints.    This is a pleasant 77 year old female who comes to my clinic today for evaluation of shortness of breath.  She says that she never had asthma as a child though she started smoking at a young age.  She tells me she only smoked a pack of day of cigarettes for about "6 years" and is unable to remember exactly when she quit, though multiple records in our health system say that she smoked for nearly 50 years and quit just about 8 or 9 years ago.  Her husband tells me that the patient had a stroke this year and implies that there may be some memory issues.  He says that she has trouble climbing a flight of stairs without getting short of breath.  She tells me that she notes shortness of breath when she walks on level ground.  She denies cough or chest congestion or significant wheezing.  She tells me that her brother is a physician and request that she be evaluated by a pulmonologist because of the shortness of breath.  Review of her health records show that this year she is had a history of nonischemic cardiomyopathy with a recent left heart catheterization.  She also had a history of a stroke this year.  She was noted to have a left ventricular ejection fraction of around 40%.  Past Medical History:  Diagnosis Date  . Cardiomyopathy    a. EF of 25% in 07/2006 b. EF normalized by repeat echo in 2011 c. EF 35-40% by echo in 05/2017 with cath showing mild nonobstructive CAD  . Chronic combined systolic and diastolic CHF (congestive heart failure) ([place])   . Colon polyps   . Diverticulosis of colon   . Hypertension   . Hypertensive heart disease   . Osteopenia   . TIA  (transient ischemic attack)   . Tobacco abuse    50 pack year     Family History  Problem Relation Age of Onset  . Hypertension Brother   . Hypertension Maternal Grandmother   . Cancer Paternal Grandmother      Social History   Socioeconomic History  . Marital status: Married    Spouse name: Not on file  . Number of children: Not on file  . Years of education: Not on file  . Highest education level: Not on file  Occupational History  . Occupation: Former Product manager: RETIRED  Social Needs  . Financial resource strain: Not on file  . Food insecurity:    Worry: Not on file    Inability: Not on file  . Transportation needs:    Medical: Not on file    Non-medical: Not on file  Tobacco Use  . Smoking status: Former Smoker    Packs/day: 1.00    Years: 50.00    Pack years: 50.00    Types: Cigarettes    Start date: 09/02/1960    Last attempt to quit: 03/29/2008    Years since quitting: 9.5  . Smokeless tobacco: Never Used  Substance and Sexual Activity  . Alcohol use: No    Alcohol/week: 0.0 oz  . Drug use: Never  .  Sexual activity: Not Currently  Lifestyle  . Physical activity:    Days per week: Not on file    Minutes per session: Not on file  . Stress: Not on file  Relationships  . Social connections:    Talks on phone: Not on file    Gets together: Not on file    Attends religious service: Not on file    Active member of club or organization: Not on file    Attends meetings of clubs or organizations: Not on file    Relationship status: Not on file  . Intimate partner violence:    Fear of current or ex partner: Not on file    Emotionally abused: Not on file    Physically abused: Not on file    Forced sexual activity: Not on file  Other Topics Concern  . Not on file  Social History Narrative   Married with 2 adult children     Allergies  Allergen Reactions  . Lisinopril Swelling     Outpatient Medications Prior to Visit  Medication Sig Dispense  Refill  . aspirin EC 81 MG EC tablet Take 2 tablets (162 mg total) by mouth daily. 30 tablet 1  . atorvastatin (LIPITOR) 10 MG tablet TAKE 1 TABLET BY MOUTH EVERY DAY AT 6 PM 30 tablet 6  . BIDIL 20-37.5 MG tablet TAKE 1 TABLET BY MOUTH THREE TIMES DAILY 90 tablet 6  . carvedilol (COREG) 12.5 MG tablet Take 1 tablet (12.5 mg total) by mouth 2 (two) times daily with a meal. 60 tablet 6  . cetirizine (ZYRTEC) 10 MG tablet Take 10 mg by mouth daily as needed for allergies.     . furosemide (LASIX) 20 MG tablet Take 1 tablet (20 mg total) by mouth daily. (Patient taking differently: Take 20 mg by mouth daily as needed. ) 45 tablet 3  . predniSONE (DELTASONE) 5 MG tablet Take 5 mg by mouth daily as needed (for swelling and right knee pain).    . cyanocobalamin (,VITAMIN B-12,) 1000 MCG/ML injection Daily for 1 week, then weekly for 1 month and then monthly (for 8 months) (Patient not taking: Reported on 10/20/2017) 25 mL 0  . potassium chloride SA (K-DUR,KLOR-CON) 20 MEQ tablet Take 1 tablet (20 mEq total) by mouth daily. (Patient not taking: Reported on 10/20/2017)     No facility-administered medications prior to visit.     Review of Systems  Constitutional: Negative for fever and weight loss.  HENT: Positive for sore throat. Negative for congestion, ear pain and nosebleeds.   Eyes: Negative for redness.  Respiratory: Negative for cough, shortness of breath and wheezing.   Cardiovascular: Negative for palpitations, leg swelling and PND.  Gastrointestinal: Negative for nausea and vomiting.  Genitourinary: Negative for dysuria.  Skin: Negative for rash.  Neurological: Negative for headaches.  Endo/Heme/Allergies: Does not bruise/bleed easily.  Psychiatric/Behavioral: Negative for depression. The patient is not nervous/anxious.       Objective:  Physical Exam   Vitals:   10/20/17 1559  BP: 124/79  Pulse: 74  SpO2: 95%  Weight: 158 lb (71.7 kg)  Height: 5' 4.5" (1.638 m)   Walked 500  feet on room air O2 saturation > 94%  Gen: well appearing, no acute distress HENT: NCAT, OP clear, neck supple without masses Eyes: PERRL, EOMi Lymph: no cervical lymphadenopathy PULM: CTA B CV: RRR, no mgr, no JVD GI: BS+, soft, nontender, no hsm Derm: no rash or skin breakdown MSK: normal bulk and tone  Neuro: A&Ox4, CN II-XII intact, strength 5/5 in all 4 extremities Psyche: normal mood and affect   CBC    Component Value Date/Time   WBC 7.7 09/14/2017 0215   RBC 3.99 09/14/2017 0215   HGB 11.0 (L) 09/14/2017 0215   HCT 35.8 (L) 09/14/2017 0215   PLT 191 09/14/2017 0215   MCV 89.7 09/14/2017 0215   MCH 27.6 09/14/2017 0215   MCHC 30.7 09/14/2017 0215   RDW 13.5 09/14/2017 0215   LYMPHSABS 2.4 07/12/2017 1759   MONOABS 0.7 07/12/2017 1759   EOSABS 0.1 07/12/2017 1759   BASOSABS 0.0 07/12/2017 1759     Chest imaging: June 2019 CT angiogram chest showed normal pulmonary parenchyma, no pulmonary embolism, some atelectasis in the left base  PFT:  Labs:  Path:  Echo: April 2019 echocardiogram showed an LVEF of 40%, otherwise no clear evidence of pulmonary hypertension, mild left atrial enlargement, no clear significant valvular abnormalities  Heart Catheterization: March 2019 left heart catheterization shows mild nonobstructive coronary artery disease, and elevated left ventricular end-diastolic pressure.  Right heart catheterization showed a mean PA pressure of 29 mmHg, pulmonary capillary wedge pressure 20 mmHg      Assessment & Plan:   Dyspnea, unspecified type - Plan: Spirometry with Graph  Small airways disease  Chronic systolic heart failure ([place])  Discussion: This is a pleasant 77 year old female with an extensive smoking history who comes to my clinic today for evaluation of shortness of breath.  She has a hard time describing the severity of her symptoms I think because of either memory problems from a stroke or due to perhaps other senile onset memory  issues.  Her husband implies that the predominant problem is shortness of breath.    Today lung function testing showed small airways disease which is commonly seen in smokers or former smokers.  Though not completely consistent with COPD its best to just treat this as if it is COPD.  That undoubtedly contributes to her shortness of breath along with deconditioning and systolic heart failure. Her O2 saturation while walking was normal today.  Plan: Small airways obstruction/COPD Try taking Anoro 1 puff daily no matter how you feel Return to clinic in 1 week and let us know how you are doing with this medicine, if you are doing well we will prescribe it  Systolic congestive heart failure: I think this is the cause of your shortness of breath It is probably a good idea to consider enrollment in cardiac rehab or something like silver sneakers where you can have a monitored exercise routine Continue to follow-up with cardiology  Prior cigarette smoking: Because of your history of cigarette smoking you are at increased risk for lung cancer.  I think it is a good idea for you to be enrolled in the lung cancer screening program.  I will reach out to the coordinator of that program.  Follow up in 1 week or sooner if needed    Current Outpatient Medications:  .  aspirin EC 81 MG EC tablet, Take 2 tablets (162 mg total) by mouth daily., Disp: 30 tablet, Rfl: 1 .  atorvastatin (LIPITOR) 10 MG tablet, TAKE 1 TABLET BY MOUTH EVERY DAY AT 6 PM, Disp: 30 tablet, Rfl: 6 .  BIDIL 20-37.5 MG tablet, TAKE 1 TABLET BY MOUTH THREE TIMES DAILY, Disp: 90 tablet, Rfl: 6 .  carvedilol (COREG) 12.5 MG tablet, Take 1 tablet (12.5 mg total) by mouth 2 (two) times daily with a meal., Disp: 60 tablet,  Rfl: 6 .  cetirizine (ZYRTEC) 10 MG tablet, Take 10 mg by mouth daily as needed for allergies. , Disp: , Rfl:  .  furosemide (LASIX) 20 MG tablet, Take 1 tablet (20 mg total) by mouth daily. (Patient taking differently:  Take 20 mg by mouth daily as needed. ), Disp: 45 tablet, Rfl: 3 .  predniSONE (DELTASONE) 5 MG tablet, Take 5 mg by mouth daily as needed (for swelling and right knee pain)., Disp: , Rfl:  .  umeclidinium-vilanterol (ANORO ELLIPTA) 62.5-25 MCG/INH AEPB, Inhale 1 puff into the lungs daily., Disp: 2 each, Rfl: 0

## 2017-10-20 NOTE — Patient Instructions (Addendum)
Small airways obstruction/COPD Try taking Anoro 1 puff daily no matter how you feel Return to clinic in 1 week and let us know how you are doing with this medicine, if you are doing well we will prescribe it  Systolic congestive heart failure: I think this is the cause of your shortness of breath It is probably a good idea to consider enrollment in cardiac rehab or something like silver sneakers where you can have a monitored exercise routine Continue to follow-up with cardiology  Prior cigarette smoking: Because of your history of cigarette smoking you are at increased risk for lung cancer.  I think it is a good idea for you to be enrolled in the lung cancer screening program.  I will reach out to the coordinator of that program.  Follow up in 1 week or sooner if needed (in [place])

## 2017-10-27 ENCOUNTER — Encounter: Payer: Self-pay | Admitting: Cardiovascular Disease

## 2017-10-27 ENCOUNTER — Ambulatory Visit: Payer: Medicare Other | Admitting: Cardiovascular Disease

## 2017-10-27 ENCOUNTER — Other Ambulatory Visit (HOSPITAL_COMMUNITY)
Admission: RE | Admit: 2017-10-27 | Discharge: 2017-10-27 | Disposition: A | Discharge status: Home / Self Care | Payer: Medicare Other | Source: Ambulatory Visit | Attending: Cardiovascular Disease | Admitting: Cardiovascular Disease

## 2017-10-27 VITALS — BP 118/70 | HR 70 | Ht 64.5 in | Wt 157.6 lb

## 2017-10-27 DIAGNOSIS — I428 Other cardiomyopathies: Secondary | ICD-10-CM | POA: Diagnosis not present

## 2017-10-27 DIAGNOSIS — I10 Essential (primary) hypertension: Secondary | ICD-10-CM

## 2017-10-27 DIAGNOSIS — N183 Chronic kidney disease, stage 3 unspecified: Secondary | ICD-10-CM

## 2017-10-27 DIAGNOSIS — Z79899 Other long term (current) drug therapy: Secondary | ICD-10-CM

## 2017-10-27 DIAGNOSIS — R0602 Shortness of breath: Secondary | ICD-10-CM

## 2017-10-27 DIAGNOSIS — Z8673 Personal history of transient ischemic attack (TIA), and cerebral infarction without residual deficits: Secondary | ICD-10-CM

## 2017-10-27 DIAGNOSIS — I5042 Chronic combined systolic (congestive) and diastolic (congestive) heart failure: Secondary | ICD-10-CM | POA: Diagnosis not present

## 2017-10-27 DIAGNOSIS — R000 Tachycardia, unspecified: Secondary | ICD-10-CM

## 2017-10-27 DIAGNOSIS — I110 Hypertensive heart disease with heart failure: Secondary | ICD-10-CM

## 2017-10-27 LAB — BASIC METABOLIC PANEL
ANION GAP: 8 (ref 5–15)
BUN: 18 mg/dL (ref 8–23)
CALCIUM: 9 mg/dL (ref 8.9–10.3)
CHLORIDE: 107 mmol/L (ref 98–111)
CO2: 30 mmol/L (ref 22–32)
Creatinine, Ser: 1.15 mg/dL — ABNORMAL HIGH (ref 0.44–1.00)
GFR calc Af Amer: 53 mL/min — ABNORMAL LOW (ref >60)
GFR calc non Af Amer: 45 mL/min — ABNORMAL LOW (ref >60)
GLUCOSE: 85 mg/dL (ref 70–99)
Potassium: 3.7 mmol/L (ref 3.5–5.1)
Sodium: 145 mmol/L (ref 135–145)

## 2017-10-27 NOTE — Progress Notes (Signed)
SUBJECTIVE: The patient presents for routine follow-up.  She was recently evaluated by pulmonology on 10/20/2017 and diagnosed with small airways obstruction/COPD.  It was recommended that she consider enrollment in cardiac rehabilitation or another form of monitored exercise.  I most recently saw her when she was hospitalized in June with CHF.  She also had some atypical chest pain and nonspecific troponin elevation.  She suffered an acute ischemic stroke in April 2019.  Most recent echocardiogram performed on 07/13/2017 showed moderately reduced left ventricular systolic function, LVEF 47%, diffuse hypokinesis, mild to moderate mitral regurgitation, and moderate left atrial dilatation.  Coronary angiography on 06/03/17 demonstrated mild nonobstructive disease with mildly elevated right heart pressures with volume overload. There was a mid RCA 25% stenosis and a 25% stenosis of the ostium of the second diagonal branch.  She denies chest pain and palpitations.  Chronic exertional dyspnea is stable.  She started Anoro a few days ago.  She denies orthopnea and leg swelling.  She has had memory issues since her stroke involving recall of music which she has known her entire life.  She does not get any form of exercise.  Her appetite has not been that great.  She does try to eat small portions.    Soc Hx: Married. 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 Her brother is a retired Doctor, hospital in [place], [place] ([employer]). 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   Review of Systems: As per "subjective", otherwise negative.  Allergies  Allergen Reactions  . Lisinopril Swelling    Current Outpatient Medications  Medication Sig Dispense Refill  . aspirin EC 81 MG EC tablet Take 2 tablets (162 mg total) by  mouth daily. 30 tablet 1  . atorvastatin (LIPITOR) 10 MG tablet TAKE 1 TABLET BY MOUTH EVERY DAY AT 6 PM 30 tablet 6  . BIDIL 20-37.5 MG tablet TAKE 1 TABLET BY MOUTH THREE TIMES DAILY 90 tablet 6  . carvedilol (COREG) 12.5 MG tablet Take 1 tablet (12.5 mg total) by mouth 2 (two) times daily with a meal. 60 tablet 6  . cetirizine (ZYRTEC) 10 MG tablet Take 10 mg by mouth daily as needed for allergies.     . furosemide (LASIX) 20 MG tablet Take 1 tablet (20 mg total) by mouth daily. (Patient taking differently: Take 20 mg by mouth daily as needed. ) 45 tablet 3  . predniSONE (DELTASONE) 5 MG tablet Take 5 mg by mouth daily as needed (for swelling and right knee pain).    Marland Kitchen umeclidinium-vilanterol (ANORO ELLIPTA) 62.5-25 MCG/INH AEPB Inhale 1 puff into the lungs daily. 2 each 0   No current facility-administered medications for this visit.     Past Medical History:  Diagnosis Date  . Cardiomyopathy    a. EF of 25% in 07/2006 b. EF normalized by repeat echo in 2011 c. EF 35-40% by echo in 05/2017 with cath showing mild nonobstructive CAD  . Chronic combined systolic and diastolic CHF (congestive heart failure) ([place])   . Colon polyps   . Diverticulosis of colon   . Hypertension   . Hypertensive heart disease   . Osteopenia   . TIA (transient ischemic attack)   . Tobacco abuse    50 pack year    Past Surgical History:  Procedure Laterality Date  . BREAST BIOPSY    .  CARPAL TUNNEL RELEASE     right hand  . CERVICAL FUSION    . CESAREAN SECTION     2 times  . COLONOSCOPY  3785   pt uncertain as to whether polypectomy required or performed  . ECTOPIC PREGNANCY SURGERY    . RIGHT/LEFT HEART CATH AND CORONARY ANGIOGRAPHY N/A 06/03/2017   Procedure: RIGHT/LEFT HEART CATH AND CORONARY ANGIOGRAPHY;  Surgeon: Jettie Booze, MD;  Location: [facility];  Service: Cardiovascular;  Laterality: N/A;  . TONSILLECTOMY      Social History   Socioeconomic History  . Marital status:  Married    Spouse name: Not on file  . Number of children: Not on file  . Years of education: Not on file  . Highest education level: Not on file  Occupational History  . Occupation: Former Product manager: RETIRED  Social Needs  . Financial resource strain: Not on file  . Food insecurity:    Worry: Not on file    Inability: Not on file  . Transportation needs:    Medical: Not on file    Non-medical: Not on file  Tobacco Use  . Smoking status: Former Smoker    Packs/day: 1.00    Years: 50.00    Pack years: 50.00    Types: Cigarettes    Start date: 09/02/1960    Last attempt to quit: 03/29/2008    Years since quitting: 9.5  . Smokeless tobacco: Never Used  Substance and Sexual Activity  . Alcohol use: No    Alcohol/week: 0.0 oz  . Drug use: Never  . Sexual activity: Not Currently  Lifestyle  . Physical activity:    Days per week: Not on file    Minutes per session: Not on file  . Stress: Not on file  Relationships  . Social connections:    Talks on phone: Not on file    Gets together: Not on file    Attends religious service: Not on file    Active member of club or organization: Not on file    Attends meetings of clubs or organizations: Not on file    Relationship status: Not on file  . Intimate partner violence:    Fear of current or ex partner: Not on file    Emotionally abused: Not on file    Physically abused: Not on file    Forced sexual activity: Not on file  Other Topics Concern  . Not on file  Social History Narrative   Married with 2 adult children     Vitals:   10/27/17 1327  BP: 118/70  Pulse: 70  SpO2: 95%  Weight: 157 lb 9.6 oz (71.5 kg)  Height: 5' 4.5" (1.638 m)    Wt Readings from Last 3 Encounters:  10/27/17 157 lb 9.6 oz (71.5 kg)  10/20/17 158 lb (71.7 kg)  09/28/17 160 lb (72.6 kg)     PHYSICAL EXAM General: NAD HEENT: Normal. Neck: No JVD, no thyromegaly. Lungs: Clear to auscultation bilaterally with normal respiratory  effort. CV: Regular rate and rhythm, normal S1/S2, no S3/S4, no murmur. No pretibial or periankle edema.  No carotid bruit.   Abdomen: Soft, nontender, no distention.  Neurologic: Alert and oriented.  Psych: Normal affect. Skin: Normal. Musculoskeletal: No gross deformities.    ECG: Reviewed above under Subjective   Labs: Lab Results  Component Value Date/Time   K 3.0 (L) 09/14/2017 02:15 AM   BUN 17 09/14/2017 02:15 AM   CREATININE  1.11 (H) 09/14/2017 02:15 AM   CREATININE 1.27 (H) 08/04/2017 12:59 PM   ALT 8 (L) 09/14/2017 02:15 AM   TSH 0.590 09/13/2017 04:31 PM   HGB 11.0 (L) 09/14/2017 02:15 AM     Lipids: Lab Results  Component Value Date/Time   LDLCALC 49 09/14/2017 02:15 AM   CHOL 88 09/14/2017 02:15 AM   TRIG 37 09/14/2017 02:15 AM   HDL 32 (L) 09/14/2017 02:15 AM       ASSESSMENT AND PLAN: 1. Chronic combined systolic and diastolic heart failure with restrictive diastolic physiology due to a nonischemic cardiomyopathy/hypertensive heart disease:Symptomatically stable. LVEF 40% in April 2019. Continue carvedilol 12.5 mg twice daily along with BiDil 20-37.5 mg 3 times daily and Lasix 20 mg  daily.  I will check a basic metabolic panel. She is supposed to take supplemental potassium 20 meq daily. I previously instructed her to weigh herself daily and should she gain 3 pounds in 24 hours or 5 pounds in a week, to take an extra 40 mg of Lasix and to double her potassium dose. She would benefit from regular exercise.  I will make a referral to cardiac rehabilitation.  2. Sinus tachycardia:Heart rate is controlled on carvedilol. No changes.  3. Chronic kidney disease stage III: BUN 17, creatinine 1.11 on 09/14/2017.  I will obtain a follow-up basic metabolic panel.  4. Chronic hypertension: Blood pressure is controlled.  No changes to therapy.  5.  Recent CVA: Currently on aspirin and statin therapy.  6.  Shortness of breath/small airways disease/COPD:  Recently evaluated by pulmonology as noted above.  She is now on Anoro.     Disposition: Follow up 6 months   Kate Sable, M.D., F.A.C.C.

## 2017-10-27 NOTE — Patient Instructions (Addendum)
Your physician wants you to follow-up in:  6 months with the Physician Assistant You will receive a reminder letter in the mail two months in advance. If you don't receive a letter, please call our office to schedule the follow-up appointment.     Your physician recommends that you continue on your current medications as directed. Please refer to the Current Medication list given to you today.    If you need a refill on your cardiac medications before your next appointment, please call your pharmacy.      Get lab work : BMET today   You have been referred to cardiac rehab at [facility], they will call you for apt     Thank you for choosing [facility] !

## 2017-11-01 ENCOUNTER — Ambulatory Visit: Payer: Medicare Other | Admitting: Pulmonary Disease

## 2017-11-01 ENCOUNTER — Encounter: Payer: Self-pay | Admitting: Pulmonary Disease

## 2017-11-01 VITALS — BP 114/64 | HR 72 | Ht 64.0 in | Wt 157.8 lb

## 2017-11-01 DIAGNOSIS — Z0000 Encounter for general adult medical examination without abnormal findings: Secondary | ICD-10-CM

## 2017-11-01 DIAGNOSIS — Z7189 Other specified counseling: Secondary | ICD-10-CM | POA: Insufficient documentation

## 2017-11-01 DIAGNOSIS — R0600 Dyspnea, unspecified: Secondary | ICD-10-CM

## 2017-11-01 DIAGNOSIS — F17200 Nicotine dependence, unspecified, uncomplicated: Secondary | ICD-10-CM | POA: Diagnosis not present

## 2017-11-01 DIAGNOSIS — Z23 Encounter for immunization: Secondary | ICD-10-CM | POA: Diagnosis not present

## 2017-11-01 MED ORDER — UMECLIDINIUM-VILANTEROL 62.5-25 MCG/INH IN AEPB: 1.0000 | INHALATION_SPRAY | Freq: Every day | RESPIRATORY_TRACT | 5 refills | Status: DC

## 2017-11-01 NOTE — Patient Instructions (Addendum)
Continue Anoro Ellipta  >>>1 puff daily >>>sample provided today  >>>prescription sent in  >>>contact us if you have difficulty obtaining the medication   Follow-up with our office if you have not heard from cardiac rehab by September/2018  We will refer you today to her lung cancer screening program >>>This is based off of your 50 pack-year smoking history >>> This is a recommendation from the [place] preventative services task force (USPSTF) >>>The USPSTF recommends annual screening for lung cancer with low-dose computed tomography (LDCT) in adults aged 44 to 80 years who have a 30 pack-year smoking history and currently smoke or have quit within the past 15 years. Screening should be discontinued once a person has not smoked for 15 years or develops a health problem that substantially limits life expectancy or the ability or willingness to have curative lung surgery.   Our office will call you and set up an appointment with Eric Form (Nurse Practitioner) who leads this program.  This appointment takes place in our office.  After completing this meeting with Eric Form NP you will get a low-dose CT as the screening >>>We will call you with those results    Prevnar 13 vaccine today  >>>Pneumovax23 in one year   Flu Vaccine in [place] / 2019  Follow up in 3 months   Continue follow up with PCP  Continue follow up with Cardiology        Please contact the office if your symptoms worsen or you have concerns that you are not improving.   Thank you for choosing [facility] for your healthcare, and for allowing [place] to partner with you on your healthcare journey. I am thankful to be able to provide care to you today.   Wyn Quaker FNP-C        Pneumococcal Conjugate Vaccine suspension for injection What is this medicine? PNEUMOCOCCAL VACCINE (NEU mo KOK al vak SEEN) is a vaccine used to prevent pneumococcus bacterial infections. These bacteria can cause serious  infections like pneumonia, meningitis, and blood infections. This vaccine will lower your chance of getting pneumonia. If you do get pneumonia, it can make your symptoms milder and your illness shorter. This vaccine will not treat an infection and will not cause infection. This vaccine is recommended for infants and young children, adults with certain medical conditions, and adults 102 years or older. This medicine may be used for other purposes; ask your health care provider or pharmacist if you have questions. COMMON BRAND NAME(S): Prevnar, Prevnar 13 What should I tell my health care provider before I take this medicine? They need to know if you have any of these conditions: -bleeding problems -fever -immune system problems -an unusual or allergic reaction to pneumococcal vaccine, diphtheria toxoid, other vaccines, latex, other medicines, foods, dyes, or preservatives -pregnant or trying to get pregnant -breast-feeding How should I use this medicine? This vaccine is for injection into a muscle. It is given by a health care professional. A copy of Vaccine Information Statements will be given before each vaccination. Read this sheet carefully each time. The sheet may change frequently. Talk to your pediatrician regarding the use of this medicine in children. While this drug may be prescribed for children as young as 79 weeks old for selected conditions, precautions do apply. Overdosage: If you think you have taken too much of this medicine contact a poison control center or emergency room at once. NOTE: This medicine is only for you. Do not share this medicine with  others. What if I miss a dose? It is important not to miss your dose. Call your doctor or health care professional if you are unable to keep an appointment. What may interact with this medicine? -medicines for cancer chemotherapy -medicines that suppress your immune function -steroid medicines like prednisone or cortisone This list  may not describe all possible interactions. Give your health care provider a list of all the medicines, herbs, non-prescription drugs, or dietary supplements you use. Also tell them if you smoke, drink alcohol, or use illegal drugs. Some items may interact with your medicine. What should I watch for while using this medicine? Mild fever and pain should go away in 3 days or less. Report any unusual symptoms to your doctor or health care professional. What side effects may I notice from receiving this medicine? Side effects that you should report to your doctor or health care professional as soon as possible: -allergic reactions like skin rash, itching or hives, swelling of the face, lips, or tongue -breathing problems -confused -fast or irregular heartbeat -fever over 102 degrees F -seizures -unusual bleeding or bruising -unusual muscle weakness Side effects that usually do not require medical attention (report to your doctor or health care professional if they continue or are bothersome): -aches and pains -diarrhea -fever of 102 degrees F or less -headache -irritable -loss of appetite -pain, tender at site where injected -trouble sleeping This list may not describe all possible side effects. Call your doctor for medical advice about side effects. You may report side effects to FDA at 1-800-FDA-1088. Where should I keep my medicine? This does not apply. This vaccine is given in a clinic, pharmacy, doctor's office, or other health care setting and will not be stored at home. NOTE: This sheet is a summary. It may not cover all possible information. If you have questions about this medicine, talk to your doctor, pharmacist, or health care provider.  2018 Elsevier/Gold Standard (2013-12-20 10:27:27)

## 2017-11-01 NOTE — Progress Notes (Signed)
@Patient  ID: Brandy [name], female    DOB: 11/26/1940, 77 y.o.   MRN: [id number]  Chief Complaint  Patient presents with  . Follow-up    states anoro has been great, no questions or concerns.     Referring provider: [name], MD  HPI: 77 year old female patient initially referred to our office on 10/20/2017 for progressive dyspnea.  PMH: cva last year, non ischemic cardiomyopathy (EF of 40 percent) Patient of Dr. Lake Bells.  Former Smoker.   Recent [place] [facility] Encounters:   10/20/2017-initial office visit- [name] 77 year old patient referred to our office for progressive dyspnea.  Patient is a former smoker.  Patient with a past medical history of stroke with some memory deficits.  Patient with a medical history of nonischemic cardiomyopathy recent left heart cath.  She had a stroke last year.  Noted to have a left ventricular ejection fraction of about 40%. Plan: Lung functioning today shows small airway disease which is commonly seen in smokers her former smoker does not completely consistent with COPD.  Trial of Anoro 1 puff daily.  Referral to cardiac rehab, continue cardiology follow-up. Referral to  Lung cancer screening program  Imaging:  June 2019 CT angiogram chest showed normal pulmonary parenchyma, no pulmonary embolism, some atelectasis in the left base  Cardiac:  07/13/2017-echocardiogram - LV ef 40percent  March 2019 left heart catheterization shows mild nonobstructive coronary artery disease, and elevated left ventricular end-diastolic pressure.  Right heart catheterization showed a mean PA pressure of 29 mmHg, pulmonary capillary wedge pressure 20 mmHg   Labs:   Micro:   Chart Review:       11/01/17 OV Pleasant 77 year old patient seen for office visit today.  Patient reports her breathing is doing much better on the Anoro Ellipta inhaler.  Patient reports that since then she has not had any other respiratory complaints or concerns.  Patient  requesting a refill of her prescription.  As well as if we have any samples present in the office.  Patient denies any other respiratory complaints.  Patient does report she has had some right shoulder pain.  This been managed with Biofreeze.  Cardiology is referring to orthopedics per patient.  Patient has not heard back from cardiac rehab.  MMRC - Breathlessness Score 1 - I get short of breath when hurrying on level ground or walking up a slight hill     Allergies  Allergen Reactions  . Lisinopril Swelling    Immunization History  Administered Date(s) Administered  . Influenza, High Dose Seasonal PF 02/20/2017  >>> needs pneumonia vaccine >>>needs flu vaccine   Past Medical History:  Diagnosis Date  . Cardiomyopathy    a. EF of 25% in 07/2006 b. EF normalized by repeat echo in 2011 c. EF 35-40% by echo in 05/2017 with cath showing mild nonobstructive CAD  . Chronic combined systolic and diastolic CHF (congestive heart failure) ([place])   . Colon polyps   . Diverticulosis of colon   . Hypertension   . Hypertensive heart disease   . Osteopenia   . TIA (transient ischemic attack)   . Tobacco abuse    50 pack year    Tobacco History: Social History   Tobacco Use  Smoking Status Former Smoker  . Packs/day: 1.00  . Years: 50.00  . Pack years: 50.00  . Types: Cigarettes  . Start date: 09/02/1960  . Last attempt to quit: 03/29/2008  . Years since quitting: 9.6  Smokeless Tobacco Never Used   Counseling given: Yes  Continue not smoking. Referred to lung cancer screening program. Based off of 50 pack year smoking history.   Outpatient Encounter Medications as of 11/01/2017  Medication Sig  . aspirin EC 81 MG EC tablet Take 2 tablets (162 mg total) by mouth daily.  Marland Kitchen atorvastatin (LIPITOR) 10 MG tablet TAKE 1 TABLET BY MOUTH EVERY DAY AT 6 PM  . BIDIL 20-37.5 MG tablet TAKE 1 TABLET BY MOUTH THREE TIMES DAILY  . carvedilol (COREG) 12.5 MG tablet Take 1 tablet (12.5 mg total) by  mouth 2 (two) times daily with a meal.  . cetirizine (ZYRTEC) 10 MG tablet Take 10 mg by mouth daily as needed for allergies.   . furosemide (LASIX) 20 MG tablet Take 1 tablet (20 mg total) by mouth daily. (Patient taking differently: Take 20 mg by mouth daily as needed. )  . predniSONE (DELTASONE) 5 MG tablet Take 5 mg by mouth daily as needed (for swelling and right knee pain).  Marland Kitchen umeclidinium-vilanterol (ANORO ELLIPTA) 62.5-25 MCG/INH AEPB Inhale 1 puff into the lungs daily.  . [DISCONTINUED] umeclidinium-vilanterol (ANORO ELLIPTA) 62.5-25 MCG/INH AEPB Inhale 1 puff into the lungs daily.   No facility-administered encounter medications on file as of 11/01/2017.      Review of Systems  Review of Systems  Constitutional: Negative for chills, fatigue, fever and unexpected weight change.  HENT: Negative for congestion, ear pain, postnasal drip, rhinorrhea, sinus pressure, sinus pain, sneezing and sore throat.   Respiratory: Negative for cough, chest tightness, shortness of breath and wheezing.        Improved sob with anoro ellipta   Cardiovascular: Negative for chest pain and palpitations.  Gastrointestinal: Negative for blood in stool, diarrhea, nausea and vomiting.  Genitourinary: Negative for dysuria, frequency and urgency.  Musculoskeletal:       Right shoulder pain, using biofreeze, pt reports cards is referring to orthopedics   Skin: Negative for color change.  Allergic/Immunologic: Negative for environmental allergies and food allergies.  Neurological: Negative for dizziness, light-headedness and headaches.  Psychiatric/Behavioral: Negative for dysphoric mood. The patient is not nervous/anxious.        Physical Exam  BP 114/64   Pulse 72   Ht 5\' 4"  (1.626 m)   Wt 157 lb 12.8 oz (71.6 kg)   SpO2 97%   BMI 27.09 kg/m   Wt Readings from Last 5 Encounters:  11/01/17 157 lb 12.8 oz (71.6 kg)  10/27/17 157 lb 9.6 oz (71.5 kg)  10/20/17 158 lb (71.7 kg)  09/28/17 160 lb  (72.6 kg)  09/14/17 166 lb 3.6 oz (75.4 kg)     Physical Exam  Constitutional: She is oriented to person, place, and time and well-developed, well-nourished, and in no distress. Vital signs are normal. No distress.  HENT:  Head: Normocephalic and atraumatic.  Right Ear: Hearing, tympanic membrane, external ear and ear canal normal.  Left Ear: Hearing, tympanic membrane, external ear and ear canal normal.  Mouth/Throat: Uvula is midline and oropharynx is clear and moist. No oropharyngeal exudate.  Eyes: Pupils are equal, round, and reactive to light.  Neck: Normal range of motion. Neck supple. No JVD present.  Cardiovascular: Normal rate, regular rhythm and normal heart sounds.  Pulmonary/Chest: Effort normal and breath sounds normal. No accessory muscle usage. No respiratory distress. She has no decreased breath sounds. She has no wheezes. She has no rhonchi.  Musculoskeletal: Normal range of motion. She exhibits no edema.  Lymphadenopathy:    She has no cervical adenopathy.  Neurological: She  is alert and oriented to person, place, and time. Gait normal.  Skin: Skin is warm and dry. She is not diaphoretic. No erythema.  Psychiatric: Mood, memory, affect and judgment normal.  Nursing note and vitals reviewed.     Lab Results:  CBC    Component Value Date/Time   WBC 7.7 09/14/2017 0215   RBC 3.99 09/14/2017 0215   HGB 11.0 (L) 09/14/2017 0215   HCT 35.8 (L) 09/14/2017 0215   PLT 191 09/14/2017 0215   MCV 89.7 09/14/2017 0215   MCH 27.6 09/14/2017 0215   MCHC 30.7 09/14/2017 0215   RDW 13.5 09/14/2017 0215   LYMPHSABS 2.4 07/12/2017 1759   MONOABS 0.7 07/12/2017 1759   EOSABS 0.1 07/12/2017 1759   BASOSABS 0.0 07/12/2017 1759    BMET    Component Value Date/Time   NA 145 10/27/2017 1451   K 3.7 10/27/2017 1451   CL 107 10/27/2017 1451   CO2 30 10/27/2017 1451   GLUCOSE 85 10/27/2017 1451   BUN 18 10/27/2017 1451   CREATININE 1.15 (H) 10/27/2017 1451   CREATININE  1.27 (H) 08/04/2017 1259   CALCIUM 9.0 10/27/2017 1451   GFRNONAA 45 (L) 10/27/2017 1451   GFRAA 53 (L) 10/27/2017 1451    BNP    Component Value Date/Time   BNP 369.0 (H) 09/13/2017 1631    ProBNP    Component Value Date/Time   PROBNP 1143.0 (H) 07/21/2006 1123    Imaging: No results found.   Assessment & Plan:   Pleasant 77 year old patient seen office visit today.  Patient reports her dyspnea has significantly improved on the Anoro Ellipta.  We will have patient continue Anoro Ellipta as well as will send in prescription.  Sample provided today.  Patient has not heard anything from cardiac rehab.  I informed the patient to follow-up with our office if cardiac rehab does not contact her by September/2019.  We will also refer patient to lung cancer screening program based off with her 50-pack-year smoking history.  Patient also get Prevnar 13 vaccine today.  Discussed with patient that she needs to obtain the flu vaccine in the fall of this year as she is high risk.  Patient agrees.  Patient to get Pneumovax 23 vaccine in 1 year.  Follow-up with our office in 3 months.   TOBACCO ABUSE Continue not smoking   We will refer you today to the lung cancer screening program >>>This is based off of your 50 pack-year smoking history >>> This is a recommendation from the [place] preventative services task force (USPSTF) >>>The USPSTF recommends annual screening for lung cancer with low-dose computed tomography (LDCT) in adults aged 79 to 80 years who have a 30 pack-year smoking history and currently smoke or have quit within the past 15 years. Screening should be discontinued once a person has not smoked for 15 years or develops a health problem that substantially limits life expectancy or the ability or willingness to have curative lung surgery.   Our office will call you and set up an appointment with Eric Form (Nurse Practitioner) who leads this program.  This appointment takes place in  our office.  After completing this meeting with Eric Form NP you will get a low-dose CT as the screening >>>We will call you with those results    Prevnar 13 vaccine today  >>>Pneumovax23 in one year   Flu Vaccine in sept / 2019  Follow up in 3 months    Dyspnea Continue Anoro Ellipta  >>>  1 puff daily >>>sample provided today  >>>prescription sent in  >>>contact us if you have difficulty obtaining the medication   Follow-up with our office if you have not heard from cardiac rehab by September/2018  Follow-up with our office in 3 months  Health care maintenance  Follow-up with our office if you have not heard from cardiac rehab by September/2018  We will refer you today to the lung cancer screening program >>>This is based off of your 50 pack-year smoking history >>> This is a recommendation from the [place] preventative services task force (USPSTF) >>>The USPSTF recommends annual screening for lung cancer with low-dose computed tomography (LDCT) in adults aged 48 to 80 years who have a 30 pack-year smoking history and currently smoke or have quit within the past 15 years. Screening should be discontinued once a person has not smoked for 15 years or develops a health problem that substantially limits life expectancy or the ability or willingness to have curative lung surgery.   Our office will call you and set up an appointment with Eric Form (Nurse Practitioner) who leads this program.  This appointment takes place in our office.  After completing this meeting with Eric Form NP you will get a low-dose CT as the screening >>>We will call you with those results  Prevnar 13 vaccine today  >>>Pneumovax23 in one year   Flu Vaccine in sept / 2019  Follow up in 3 months       Lauraine Rinne, NP 11/01/2017

## 2017-11-01 NOTE — Assessment & Plan Note (Signed)
  Follow-up with our office if you have not heard from cardiac rehab by September/2018  We will refer you today to the lung cancer screening program >>>This is based off of your 50 pack-year smoking history >>> This is a recommendation from the [place] preventative services task force (USPSTF) >>>The USPSTF recommends annual screening for lung cancer with low-dose computed tomography (LDCT) in adults aged 75 to 80 years who have a 30 pack-year smoking history and currently smoke or have quit within the past 15 years. Screening should be discontinued once a person has not smoked for 15 years or develops a health problem that substantially limits life expectancy or the ability or willingness to have curative lung surgery.   Our office will call you and set up an appointment with Eric Form (Nurse Practitioner) who leads this program.  This appointment takes place in our office.  After completing this meeting with Eric Form NP you will get a low-dose CT as the screening >>>We will call you with those results  Prevnar 13 vaccine today  >>>Pneumovax23 in one year   Flu Vaccine in sept / 2019  Follow up in 3 months

## 2017-11-01 NOTE — Assessment & Plan Note (Signed)
Continue not smoking   We will refer you today to the lung cancer screening program >>>This is based off of your 50 pack-year smoking history >>> This is a recommendation from the [place] preventative services task force (USPSTF) >>>The USPSTF recommends annual screening for lung cancer with low-dose computed tomography (LDCT) in adults aged 75 to 80 years who have a 30 pack-year smoking history and currently smoke or have quit within the past 15 years. Screening should be discontinued once a person has not smoked for 15 years or develops a health problem that substantially limits life expectancy or the ability or willingness to have curative lung surgery.   Our office will call you and set up an appointment with Eric Form (Nurse Practitioner) who leads this program.  This appointment takes place in our office.  After completing this meeting with Eric Form NP you will get a low-dose CT as the screening >>>We will call you with those results    Prevnar 13 vaccine today  >>>Pneumovax23 in one year   Flu Vaccine in sept / 2019  Follow up in 3 months

## 2017-11-01 NOTE — Progress Notes (Signed)
Reviewed, agree 

## 2017-11-01 NOTE — Assessment & Plan Note (Signed)
Continue Anoro Ellipta  >>>1 puff daily >>>sample provided today  >>>prescription sent in  >>>contact us if you have difficulty obtaining the medication   Follow-up with our office if you have not heard from cardiac rehab by September/2018  Follow-up with our office in 3 months

## 2017-12-08 ENCOUNTER — Telehealth: Payer: Self-pay | Admitting: Pulmonary Disease

## 2017-12-08 MED ORDER — UMECLIDINIUM-VILANTEROL 62.5-25 MCG/INH IN AEPB: 1.0000 | INHALATION_SPRAY | Freq: Every day | RESPIRATORY_TRACT | 5 refills | Status: DC

## 2017-12-08 NOTE — Telephone Encounter (Signed)
Spoke with pt's husband. Pt is needing a prescription for Anoro. Rx has been sent in. Nothing further was needed.

## 2017-12-12 ENCOUNTER — Telehealth: Payer: Self-pay

## 2017-12-12 NOTE — Telephone Encounter (Addendum)
Husband called to say wife's left foot has been swollen for a week and she has not been able to put weight on it. No swelling beyond foot, denies injury, redness or heat from area. I advised him to see pcp for evaluation. He will call them in the morning.

## 2018-01-16 ENCOUNTER — Telehealth (HOSPITAL_COMMUNITY): Payer: Self-pay | Admitting: Speech Pathology

## 2018-01-16 NOTE — Telephone Encounter (Signed)
[name] husband called wanted to know what to do to get wife back in with Speech. Instructed Mr. Paster to call MD and get referral sent to our office for Speech-Studdering.

## 2018-02-01 ENCOUNTER — Encounter (HOSPITAL_COMMUNITY): Payer: Medicare Other

## 2018-02-08 ENCOUNTER — Ambulatory Visit (INDEPENDENT_AMBULATORY_CARE_PROVIDER_SITE_OTHER): Payer: Medicare Other | Admitting: Student

## 2018-02-08 ENCOUNTER — Encounter

## 2018-02-08 ENCOUNTER — Encounter: Payer: Self-pay | Admitting: Student

## 2018-02-08 VITALS — BP 138/68 | HR 72 | Ht 64.5 in | Wt 147.0 lb

## 2018-02-08 DIAGNOSIS — I5042 Chronic combined systolic (congestive) and diastolic (congestive) heart failure: Secondary | ICD-10-CM

## 2018-02-08 DIAGNOSIS — R630 Anorexia: Secondary | ICD-10-CM | POA: Diagnosis not present

## 2018-02-08 DIAGNOSIS — R634 Abnormal weight loss: Secondary | ICD-10-CM

## 2018-02-08 DIAGNOSIS — Z8673 Personal history of transient ischemic attack (TIA), and cerebral infarction without residual deficits: Secondary | ICD-10-CM

## 2018-02-08 DIAGNOSIS — R258 Other abnormal involuntary movements: Secondary | ICD-10-CM

## 2018-02-08 DIAGNOSIS — N183 Chronic kidney disease, stage 3 unspecified: Secondary | ICD-10-CM

## 2018-02-08 MED ORDER — FUROSEMIDE 20 MG PO TABS: 20.0000 mg | ORAL_TABLET | Freq: Every day | ORAL | 3 refills | Status: DC | PRN

## 2018-02-08 NOTE — Patient Instructions (Signed)
Medication Instructions:  Your physician has recommended you make the following change in your medication:  Take Lasix as Needed   If you need a refill on your cardiac medications before your next appointment, please call your pharmacy.   Lab work: Your physician recommends that you return for lab work in: today   If you have labs (blood work) drawn today and your tests are completely normal, you will receive your results only by: Marland Kitchen MyChart Message (if you have MyChart) OR . A paper copy in the mail If you have any lab test that is abnormal or we need to change your treatment, we will call you to review the results.  Testing/Procedures: NONE   Follow-Up: At [facility], you and your health needs are our priority.  As part of our continuing mission to provide you with exceptional heart care, we have created designated Provider Care Teams.  These Care Teams include your primary Cardiologist (physician) and Advanced Practice Providers (APPs -  Physician Assistants and Nurse Practitioners) who all work together to provide you with the care you need, when you need it. You will need a follow up appointment in 2 months.  Please call our office 2 months in advance to schedule this appointment.  You may see Kate Sable, MD or one of the following Advanced Practice Providers on your designated Care Team:   Bernerd Pho, PA-C [facility]) . Ermalinda Barrios, PA-C ([place])  Any Other Special Instructions Will Be Listed Below (If Applicable). Thank you for choosing [facility]!

## 2018-02-08 NOTE — Progress Notes (Signed)
Cardiology Office Note    Date:  02/08/2018   ID:  Brandy [name], DOB [birth year], MRN [id number]  PCP:  [name], MD  Cardiologist: Kate Sable, MD    Chief Complaint  Patient presents with  . Follow-up    recent weight loss, decreased appetite, clonus of upper extremities, and intermittent stuttering    History of Present Illness:    Brandy [name] is a 77 y.o. female with past medical history of chronic combined systolic and diastolic CHF (EF 97-35% by echo in 05/2017 with cath showing mild nonobstructive CAD), nonischemic cardiomyopathy, HTN, HLD, COPD and prior CVA who presents to the office today for evaluation of multiple non-cardiac issues.   She was last examined by Dr. Bronson Ing in 10/2017 and reported having chronic dyspnea on exertion but denied any recent chest pain or palpitations. She was continued on her current medication regimen including Carvedilol 12.5mg  BID and BiDil 20-37.5mg  TID, and Lasix 20mg  daily.  In talking with the patient and her husband today, they report multiple recent issues over the past 2 months. She has been consuming overall less food due to having a decreased appetite and has experienced an associated 10 pound weight loss. Denies any specific dysphagia. No recent nausea, vomiting, diarrhea, or constipation. She has not yet followed up for her PCP in regards to further evaluation.  Also mentions having episodes of a "jerking motion" along her upper extremities which can occur a few times per week and only lasts for seconds at a time. Her husband also mentions that she has been having more frequent episodes of stuttering and is unable to specify how frequently this occurs. She did work with speech therapy following her recent CVA but completed therapy several months ago. Denies any weakness along her extremities. No associated headaches or vision changes.   From a cardiac perspective, she has overall been doing well and denies any recent  chest pain or dyspnea on exertion. No recent orthopnea, PND, lower extremity edema, palpitations, lightheadedness, dizziness, or presyncope.   Past Medical History:  Diagnosis Date  . Cardiomyopathy    a. EF of 25% in 07/2006 b. EF normalized by repeat echo in 2011 c. EF 35-40% by echo in 05/2017 with cath showing mild nonobstructive CAD  . Chronic combined systolic and diastolic CHF (congestive heart failure) ([place])   . Colon polyps   . Diverticulosis of colon   . Hypertension   . Hypertensive heart disease   . Osteopenia   . TIA (transient ischemic attack)   . Tobacco abuse    50 pack year    Past Surgical History:  Procedure Laterality Date  . BREAST BIOPSY    . CARPAL TUNNEL RELEASE     right hand  . CERVICAL FUSION    . CESAREAN SECTION     2 times  . COLONOSCOPY  3299   pt uncertain as to whether polypectomy required or performed  . ECTOPIC PREGNANCY SURGERY    . RIGHT/LEFT HEART CATH AND CORONARY ANGIOGRAPHY N/A 06/03/2017   Procedure: RIGHT/LEFT HEART CATH AND CORONARY ANGIOGRAPHY;  Surgeon: Jettie Booze, MD;  Location: [facility];  Service: Cardiovascular;  Laterality: N/A;  . TONSILLECTOMY      Current Medications: Outpatient Medications Prior to Visit  Medication Sig Dispense Refill  . aspirin EC 81 MG EC tablet Take 2 tablets (162 mg total) by mouth daily. 30 tablet 1  . atorvastatin (LIPITOR) 10 MG tablet TAKE 1 TABLET BY MOUTH EVERY  DAY AT 6 PM 30 tablet 6  . BIDIL 20-37.5 MG tablet TAKE 1 TABLET BY MOUTH THREE TIMES DAILY 90 tablet 6  . carvedilol (COREG) 12.5 MG tablet Take 1 tablet (12.5 mg total) by mouth 2 (two) times daily with a meal. 60 tablet 6  . cetirizine (ZYRTEC) 10 MG tablet Take 10 mg by mouth daily as needed for allergies.     . predniSONE (DELTASONE) 5 MG tablet Take 5 mg by mouth daily as needed (for swelling and right knee pain).    Marland Kitchen umeclidinium-vilanterol (ANORO ELLIPTA) 62.5-25 MCG/INH AEPB Inhale 1 puff into the lungs  daily. 60 each 5  . furosemide (LASIX) 20 MG tablet Take 1 tablet (20 mg total) by mouth daily. (Patient taking differently: Take 20 mg by mouth daily as needed. ) 45 tablet 3   No facility-administered medications prior to visit.      Allergies:   Lisinopril   Social History   Socioeconomic History  . Marital status: Married    Spouse name: Not on file  . Number of children: Not on file  . Years of education: Not on file  . Highest education level: Not on file  Occupational History  . Occupation: Former Product manager: RETIRED  Social Needs  . Financial resource strain: Not on file  . Food insecurity:    Worry: Not on file    Inability: Not on file  . Transportation needs:    Medical: Not on file    Non-medical: Not on file  Tobacco Use  . Smoking status: Former Smoker    Packs/day: 1.00    Years: 50.00    Pack years: 50.00    Types: Cigarettes    Start date: 09/02/1960    Last attempt to quit: 03/29/2008    Years since quitting: 9.8  . Smokeless tobacco: Never Used  Substance and Sexual Activity  . Alcohol use: No    Alcohol/week: 0.0 standard drinks  . Drug use: Never  . Sexual activity: Not Currently  Lifestyle  . Physical activity:    Days per week: Not on file    Minutes per session: Not on file  . Stress: Not on file  Relationships  . Social connections:    Talks on phone: Not on file    Gets together: Not on file    Attends religious service: Not on file    Active member of club or organization: Not on file    Attends meetings of clubs or organizations: Not on file    Relationship status: Not on file  Other Topics Concern  . Not on file  Social History Narrative   Married with 2 adult children     Family History:  The patient's family history includes Cancer in her paternal grandmother; Hypertension in her brother and maternal grandmother.   Review of Systems:   Please see the history of present illness.     General:  No chills, fever, or  night sweats. Positive for decreased appetite and weight loss.  Cardiovascular:  No chest pain, dyspnea on exertion, edema, orthopnea, palpitations, paroxysmal nocturnal dyspnea. Dermatological: No rash, lesions/masses Respiratory: No cough, dyspnea Urologic: No hematuria, dysuria Abdominal:   No nausea, vomiting, diarrhea, bright red blood per rectum, melena, or hematemesis Neurologic:  No visual changes, wkns, changes in mental status. Positive for speech changes and clonus.   All other systems reviewed and are otherwise negative except as noted above.   Physical Exam:  VS:  BP 138/68 (BP Location: Left Arm)   Pulse 72   Ht 5' 4.5" (1.638 m)   Wt 147 lb (66.7 kg)   SpO2 96%   BMI 24.84 kg/m    General: Well developed, well nourished [place] American female appearing in no acute distress. Head: Normocephalic, atraumatic, sclera non-icteric, no xanthomas, nares are without discharge.  Neck: No carotid bruits. JVD not elevated.  Lungs: Respirations regular and unlabored, without wheezes or rales.  Heart: Regular rate and rhythm. No S3 or S4.  No murmur, no rubs, or gallops appreciated. Abdomen: Soft, non-tender, non-distended with normoactive bowel sounds. No hepatomegaly. No rebound/guarding. No obvious abdominal masses. Msk:  Strength and tone appear normal for age. No joint deformities or effusions. Extremities: No clubbing or cyanosis. No lower extremity edema.  Distal pedal pulses are 2+ bilaterally. Neuro: Alert and oriented X 3. Moves all extremities spontaneously. No focal deficits noted. Psych:  Responds to questions appropriately with a normal affect. Skin: No rashes or lesions noted  Wt Readings from Last 3 Encounters:  02/08/18 147 lb (66.7 kg)  11/01/17 157 lb 12.8 oz (71.6 kg)  10/27/17 157 lb 9.6 oz (71.5 kg)     Studies/Labs Reviewed:   EKG:  EKG is not ordered today.   Recent Labs: 06/06/2017: Magnesium 1.7 09/13/2017: B Natriuretic Peptide 369.0; TSH  0.590 09/14/2017: ALT 8; Hemoglobin 11.0; Platelets 191 10/27/2017: BUN 18; Creatinine, Ser 1.15; Potassium 3.7; Sodium 145   Lipid Panel    Component Value Date/Time   CHOL 88 09/14/2017 0215   TRIG 37 09/14/2017 0215   HDL 32 (L) 09/14/2017 0215   CHOLHDL 2.8 09/14/2017 0215   VLDL 7 09/14/2017 0215   LDLCALC 49 09/14/2017 0215    Additional studies/ records that were reviewed today include:   Cardiac Catheterization: 05/2017  Ost 2nd Diag to 2nd Diag lesion is 25% stenosed.  Mid RCA lesion is 25% stenosed.  LV end diastolic pressure is moderately elevated.  There is no aortic valve stenosis.  LV end diastolic pressure is normal.  Hemodynamic findings consistent with mild pulmonary hypertension.  CO 5.2 L/min; CI 2.86; Ao 94%; PA sat 65%, mean PA 29 mm Hg; PCWP 20 mm Hg   Mild, nonobstructive CAD.   Mildly elevated right heart pressures with volume overload.   Continue medical therapy.  EF not assessed to minimize contrast exposure.  Limited Echo: 06/2017 Study Conclusions  - Left ventricle: The cavity size was normal. Wall thickness was   increased in a pattern of moderate LVH. Systolic function was   mildly to moderately reduced. The estimated ejection fraction was   = 40%. Diffuse hypokinesis. - Aortic valve: Mildly calcified annulus. Trileaflet; normal   thickness leaflets. Valve area (VTI): 1.88 cm^2. Valve area   (Vmax): 2.07 cm^2. - Mitral valve: Mildly calcified annulus. Normal thickness leaflets   . There was mild to moderate regurgitation. The MR VC is 0.3 cm. - Left atrium: The atrium was moderately dilated. - Atrial septum: No defect or patent foramen ovale was identified  Assessment:    1. Chronic combined systolic and diastolic CHF (congestive heart failure) ([place])   2. Decreased appetite   3. Weight loss   4. Clonus   5. History of CVA (cerebrovascular accident)   6. CKD (chronic kidney disease), stage III ([place])      Plan:   In  order of problems listed above:  1. Chronic Combined Systolic and Diastolic CHF/ Nonischemic Cardiomyopathy - the patient has  a known reduced EF of 35-40% by echo in 05/2017 with cath showing mild nonobstructive CAD. Overall, she has been doing well from a cardiac perspective and denies any recent chest pain, dyspnea on exertion, orthopnea, PND, or lower extremity edema. Appears euvolemic by examination. - continue Coreg 12.5mg  BID and BiDil 20-37.5mg  TID at current dosing. Will reduce Lasix from 20mg  daily to PRN dosing for weight gain or edema given her decreased oral intake. Will recheck a BMET today to assess kidney function and K+ levels.   2. Decreased Appetite/ Weight Loss - reports decreased oral intake over the past 2 months with an associated 10 pound weight loss. Denies any specific dysphagia. No recent nausea, vomiting, diarrhea, or constipation.  - will check basic labs including CBC, BMET, and TSH. I encouraged her to follow-up with her PCP if labs are unrevealing for further evaluation. Encouraged her to consume a protein supplement shake at least twice daily.   3. Clonus of Upper Extremities/ History of CVA  - she reports intermittent episodes of a "jerking motion" along her upper extremities which can occur a few times per week and only lasts for seconds at a time. Has also had episodes of "stuttering" which has worsened per her husband's report.  - no episodes of clonus noted during the encounter today. No focal neurologic deficits on examination. I recommended they see her Neurologist in follow-up for further evaluation.   4. Stage 3 CKD - creatinine stable at 1.15 by review of labs in 10/2017. Will repeat BMET today.    Medication Adjustments/Labs and Tests Ordered: Current medicines are reviewed at length with the patient today.  Concerns regarding medicines are outlined above.  Medication changes, Labs and Tests ordered today are listed in the Patient Instructions  below. Patient Instructions  Medication Instructions:  Your physician has recommended you make the following change in your medication:  Take Lasix as Needed   If you need a refill on your cardiac medications before your next appointment, please call your pharmacy.   Lab work: Your physician recommends that you return for lab work in: today   If you have labs (blood work) drawn today and your tests are completely normal, you will receive your results only by: Marland Kitchen MyChart Message (if you have MyChart) OR . A paper copy in the mail If you have any lab test that is abnormal or we need to change your treatment, we will call you to review the results.  Testing/Procedures: NONE   Follow-Up: At [facility], you and your health needs are our priority.  As part of our continuing mission to provide you with exceptional heart care, we have created designated Provider Care Teams.  These Care Teams include your primary Cardiologist (physician) and Advanced Practice Providers (APPs -  Physician Assistants and Nurse Practitioners) who all work together to provide you with the care you need, when you need it. You will need a follow up appointment in 2 months.  Please call our office 2 months in advance to schedule this appointment.  You may see Kate Sable, MD or one of the following Advanced Practice Providers on your designated Care Team:   Bernerd Pho, PA-C [facility]) . Ermalinda Barrios, PA-C ([facility])  Any Other Special Instructions Will Be Listed Below (If Applicable). Thank you for choosing [facility]!     Signed, Erma Heritage, PA-C  02/08/2018 8:17 PM    [place] [address] [place], [place] [address]  Phone: [phone]

## 2018-02-10 ENCOUNTER — Ambulatory Visit (HOSPITAL_COMMUNITY): Payer: Medicare Other

## 2018-02-10 ENCOUNTER — Encounter (HOSPITAL_COMMUNITY)
Admission: EM | Disposition: A | Discharge status: Rehabilitation Facility | Payer: Self-pay | Source: Home / Self Care | Attending: Neurology

## 2018-02-10 ENCOUNTER — Encounter (HOSPITAL_COMMUNITY): Payer: Self-pay | Admitting: Emergency Medicine

## 2018-02-10 ENCOUNTER — Emergency Department (HOSPITAL_COMMUNITY): Payer: Medicare Other | Admitting: Anesthesiology

## 2018-02-10 ENCOUNTER — Inpatient Hospital Stay (HOSPITAL_COMMUNITY)
Admission: EM | Admit: 2018-02-10 | Discharge: 2018-02-16 | DRG: 024 | Disposition: A | Discharge status: Rehabilitation Facility | Payer: Medicare Other | Attending: Neurology | Admitting: Neurology

## 2018-02-10 ENCOUNTER — Emergency Department (HOSPITAL_COMMUNITY): Payer: Medicare Other

## 2018-02-10 ENCOUNTER — Ambulatory Visit (HOSPITAL_COMMUNITY)
Admit: 2018-02-10 | Discharge: 2018-02-10 | Disposition: A | Discharge status: Home / Self Care | Payer: Medicare Other | Attending: Neurology | Admitting: Neurology

## 2018-02-10 ENCOUNTER — Other Ambulatory Visit: Payer: Self-pay

## 2018-02-10 DIAGNOSIS — I428 Other cardiomyopathies: Secondary | ICD-10-CM | POA: Diagnosis present

## 2018-02-10 DIAGNOSIS — J449 Chronic obstructive pulmonary disease, unspecified: Secondary | ICD-10-CM | POA: Diagnosis not present

## 2018-02-10 DIAGNOSIS — N183 Chronic kidney disease, stage 3 unspecified: Secondary | ICD-10-CM | POA: Diagnosis present

## 2018-02-10 DIAGNOSIS — Z7952 Long term (current) use of systemic steroids: Secondary | ICD-10-CM

## 2018-02-10 DIAGNOSIS — Z8249 Family history of ischemic heart disease and other diseases of the circulatory system: Secondary | ICD-10-CM

## 2018-02-10 DIAGNOSIS — Z23 Encounter for immunization: Secondary | ICD-10-CM | POA: Diagnosis present

## 2018-02-10 DIAGNOSIS — E785 Hyperlipidemia, unspecified: Secondary | ICD-10-CM | POA: Diagnosis present

## 2018-02-10 DIAGNOSIS — K5730 Diverticulosis of large intestine without perforation or abscess without bleeding: Secondary | ICD-10-CM | POA: Diagnosis not present

## 2018-02-10 DIAGNOSIS — I6930 Unspecified sequelae of cerebral infarction: Secondary | ICD-10-CM

## 2018-02-10 DIAGNOSIS — I10 Essential (primary) hypertension: Secondary | ICD-10-CM

## 2018-02-10 DIAGNOSIS — N189 Chronic kidney disease, unspecified: Secondary | ICD-10-CM | POA: Diagnosis not present

## 2018-02-10 DIAGNOSIS — Z79899 Other long term (current) drug therapy: Secondary | ICD-10-CM | POA: Diagnosis not present

## 2018-02-10 DIAGNOSIS — Z981 Arthrodesis status: Secondary | ICD-10-CM

## 2018-02-10 DIAGNOSIS — E876 Hypokalemia: Secondary | ICD-10-CM | POA: Diagnosis not present

## 2018-02-10 DIAGNOSIS — I63311 Cerebral infarction due to thrombosis of right middle cerebral artery: Secondary | ICD-10-CM | POA: Diagnosis not present

## 2018-02-10 DIAGNOSIS — G8194 Hemiplegia, unspecified affecting left nondominant side: Secondary | ICD-10-CM | POA: Diagnosis present

## 2018-02-10 DIAGNOSIS — I2510 Atherosclerotic heart disease of native coronary artery without angina pectoris: Secondary | ICD-10-CM | POA: Diagnosis present

## 2018-02-10 DIAGNOSIS — Z7982 Long term (current) use of aspirin: Secondary | ICD-10-CM

## 2018-02-10 DIAGNOSIS — E46 Unspecified protein-calorie malnutrition: Secondary | ICD-10-CM | POA: Diagnosis not present

## 2018-02-10 DIAGNOSIS — R29715 NIHSS score 15: Secondary | ICD-10-CM | POA: Diagnosis present

## 2018-02-10 DIAGNOSIS — I340 Nonrheumatic mitral (valve) insufficiency: Secondary | ICD-10-CM | POA: Diagnosis not present

## 2018-02-10 DIAGNOSIS — R29810 Facial weakness: Secondary | ICD-10-CM | POA: Diagnosis present

## 2018-02-10 DIAGNOSIS — R509 Fever, unspecified: Secondary | ICD-10-CM | POA: Diagnosis not present

## 2018-02-10 DIAGNOSIS — I130 Hypertensive heart and chronic kidney disease with heart failure and stage 1 through stage 4 chronic kidney disease, or unspecified chronic kidney disease: Secondary | ICD-10-CM | POA: Diagnosis present

## 2018-02-10 DIAGNOSIS — I5042 Chronic combined systolic (congestive) and diastolic (congestive) heart failure: Secondary | ICD-10-CM | POA: Diagnosis present

## 2018-02-10 DIAGNOSIS — N182 Chronic kidney disease, stage 2 (mild): Secondary | ICD-10-CM | POA: Diagnosis not present

## 2018-02-10 DIAGNOSIS — I63511 Cerebral infarction due to unspecified occlusion or stenosis of right middle cerebral artery: Secondary | ICD-10-CM

## 2018-02-10 DIAGNOSIS — E669 Obesity, unspecified: Secondary | ICD-10-CM | POA: Diagnosis present

## 2018-02-10 DIAGNOSIS — M8580 Other specified disorders of bone density and structure, unspecified site: Secondary | ICD-10-CM | POA: Diagnosis present

## 2018-02-10 DIAGNOSIS — D62 Acute posthemorrhagic anemia: Secondary | ICD-10-CM | POA: Diagnosis not present

## 2018-02-10 DIAGNOSIS — R471 Dysarthria and anarthria: Secondary | ICD-10-CM | POA: Diagnosis present

## 2018-02-10 DIAGNOSIS — I69354 Hemiplegia and hemiparesis following cerebral infarction affecting left non-dominant side: Secondary | ICD-10-CM | POA: Diagnosis not present

## 2018-02-10 DIAGNOSIS — Z6825 Body mass index (BMI) 25.0-25.9, adult: Secondary | ICD-10-CM

## 2018-02-10 DIAGNOSIS — Z87891 Personal history of nicotine dependence: Secondary | ICD-10-CM

## 2018-02-10 DIAGNOSIS — I6601 Occlusion and stenosis of right middle cerebral artery: Secondary | ICD-10-CM | POA: Diagnosis not present

## 2018-02-10 DIAGNOSIS — I639 Cerebral infarction, unspecified: Secondary | ICD-10-CM | POA: Diagnosis present

## 2018-02-10 DIAGNOSIS — I255 Ischemic cardiomyopathy: Secondary | ICD-10-CM | POA: Diagnosis not present

## 2018-02-10 DIAGNOSIS — D696 Thrombocytopenia, unspecified: Secondary | ICD-10-CM | POA: Diagnosis not present

## 2018-02-10 DIAGNOSIS — I471 Supraventricular tachycardia: Secondary | ICD-10-CM | POA: Diagnosis not present

## 2018-02-10 DIAGNOSIS — I25119 Atherosclerotic heart disease of native coronary artery with unspecified angina pectoris: Secondary | ICD-10-CM | POA: Diagnosis not present

## 2018-02-10 DIAGNOSIS — Z8673 Personal history of transient ischemic attack (TIA), and cerebral infarction without residual deficits: Secondary | ICD-10-CM

## 2018-02-10 DIAGNOSIS — I63411 Cerebral infarction due to embolism of right middle cerebral artery: Secondary | ICD-10-CM | POA: Diagnosis present

## 2018-02-10 DIAGNOSIS — Z8701 Personal history of pneumonia (recurrent): Secondary | ICD-10-CM

## 2018-02-10 DIAGNOSIS — Z888 Allergy status to other drugs, medicaments and biological substances status: Secondary | ICD-10-CM

## 2018-02-10 DIAGNOSIS — R414 Neurologic neglect syndrome: Secondary | ICD-10-CM | POA: Diagnosis not present

## 2018-02-10 DIAGNOSIS — I6389 Other cerebral infarction: Secondary | ICD-10-CM | POA: Diagnosis not present

## 2018-02-10 DIAGNOSIS — D649 Anemia, unspecified: Secondary | ICD-10-CM | POA: Diagnosis not present

## 2018-02-10 HISTORY — PX: IR ANGIO VERTEBRAL SEL SUBCLAVIAN INNOMINATE UNI R MOD SED: IMG5365

## 2018-02-10 HISTORY — PX: IR PERCUTANEOUS ART THROMBECTOMY/INFUSION INTRACRANIAL INC DIAG ANGIO: IMG6087

## 2018-02-10 HISTORY — PX: RADIOLOGY WITH ANESTHESIA: SHX6223

## 2018-02-10 HISTORY — PX: IR CT HEAD LTD: IMG2386

## 2018-02-10 LAB — URINALYSIS, ROUTINE W REFLEX MICROSCOPIC
Bilirubin Urine: NEGATIVE
Glucose, UA: NEGATIVE mg/dL
Hgb urine dipstick: NEGATIVE
Ketones, ur: 20 mg/dL — AB
LEUKOCYTES UA: NEGATIVE
NITRITE: NEGATIVE
PH: 5 (ref 5.0–8.0)
Protein, ur: NEGATIVE mg/dL

## 2018-02-10 LAB — COMPREHENSIVE METABOLIC PANEL
ALT: 14 U/L (ref 0–44)
AST: 18 U/L (ref 15–41)
Albumin: 3.5 g/dL (ref 3.5–5.0)
Alkaline Phosphatase: 54 U/L (ref 38–126)
Anion gap: 7 (ref 5–15)
BILIRUBIN TOTAL: 0.8 mg/dL (ref 0.3–1.2)
BUN: 13 mg/dL (ref 8–23)
CO2: 27 mmol/L (ref 22–32)
CREATININE: 1.05 mg/dL — AB (ref 0.44–1.00)
Calcium: 8.8 mg/dL — ABNORMAL LOW (ref 8.9–10.3)
Chloride: 110 mmol/L (ref 98–111)
GFR, EST AFRICAN AMERICAN: 59 mL/min — AB (ref >60)
GFR, EST NON AFRICAN AMERICAN: 51 mL/min — AB (ref >60)
Glucose, Bld: 89 mg/dL (ref 70–99)
POTASSIUM: 3.7 mmol/L (ref 3.5–5.1)
Sodium: 144 mmol/L (ref 135–145)
TOTAL PROTEIN: 5.9 g/dL — AB (ref 6.5–8.1)

## 2018-02-10 LAB — CBC
HCT: 36.9 % (ref 36.0–46.0)
Hemoglobin: 11.3 g/dL — ABNORMAL LOW (ref 12.0–15.0)
MCH: 28 pg (ref 26.0–34.0)
MCHC: 30.6 g/dL (ref 30.0–36.0)
MCV: 91.6 fL (ref 80.0–100.0)
PLATELETS: 183 10*3/uL (ref 150–400)
RBC: 4.03 MIL/uL (ref 3.87–5.11)
RDW: 15.1 % (ref 11.5–15.5)
WBC: 4.8 10*3/uL (ref 4.0–10.5)
nRBC: 0 % (ref 0.0–0.2)

## 2018-02-10 LAB — POCT I-STAT, CHEM 8
BUN: 12 mg/dL (ref 8–23)
Calcium, Ion: 1.2 mmol/L (ref 1.15–1.40)
Chloride: 106 mmol/L (ref 98–111)
Creatinine, Ser: 1.1 mg/dL — ABNORMAL HIGH (ref 0.44–1.00)
Glucose, Bld: 88 mg/dL (ref 70–99)
HCT: 34 % — ABNORMAL LOW (ref 36.0–46.0)
Hemoglobin: 11.6 g/dL — ABNORMAL LOW (ref 12.0–15.0)
POTASSIUM: 3.7 mmol/L (ref 3.5–5.1)
SODIUM: 146 mmol/L — AB (ref 135–145)
TCO2: 28 mmol/L (ref 22–32)

## 2018-02-10 LAB — DIFFERENTIAL
ABS IMMATURE GRANULOCYTES: 0.01 10*3/uL (ref 0.00–0.07)
Basophils Absolute: 0 10*3/uL (ref 0.0–0.1)
Basophils Relative: 1 %
EOS ABS: 0.2 10*3/uL (ref 0.0–0.5)
Eosinophils Relative: 3 %
Immature Granulocytes: 0 %
LYMPHS ABS: 1.3 10*3/uL (ref 0.7–4.0)
Lymphocytes Relative: 26 %
MONO ABS: 0.5 10*3/uL (ref 0.1–1.0)
MONOS PCT: 11 %
Neutro Abs: 2.8 10*3/uL (ref 1.7–7.7)
Neutrophils Relative %: 59 %

## 2018-02-10 LAB — PROTIME-INR
INR: 1.17
PROTHROMBIN TIME: 14.7 s (ref 11.4–15.2)

## 2018-02-10 LAB — RAPID URINE DRUG SCREEN, HOSP PERFORMED
AMPHETAMINES: NOT DETECTED
Barbiturates: NOT DETECTED
Benzodiazepines: NOT DETECTED
Cocaine: NOT DETECTED
OPIATES: NOT DETECTED
Tetrahydrocannabinol: NOT DETECTED

## 2018-02-10 LAB — APTT: aPTT: 32 seconds (ref 24–36)

## 2018-02-10 LAB — GLUCOSE, CAPILLARY: Glucose-Capillary: 75 mg/dL (ref 70–99)

## 2018-02-10 LAB — ETHANOL

## 2018-02-10 LAB — POCT I-STAT TROPONIN I: TROPONIN I, POC: 0.15 ng/mL — AB (ref 0.00–0.08)

## 2018-02-10 SURGERY — RADIOLOGY WITH ANESTHESIA
Anesthesia: General

## 2018-02-10 MED ORDER — ACETAMINOPHEN 325 MG PO TABS: 650.0000 mg | ORAL_TABLET | ORAL | Status: DC | PRN

## 2018-02-10 MED ORDER — ACETAMINOPHEN 160 MG/5ML PO SOLN: 650.0000 mg | ORAL | Status: DC | PRN

## 2018-02-10 MED ORDER — CLOPIDOGREL BISULFATE 300 MG PO TABS
ORAL_TABLET | ORAL | Status: AC
  Filled 2018-02-10: qty 1

## 2018-02-10 MED ORDER — IOPAMIDOL (ISOVUE-370) INJECTION 76%
100.0000 mL | Freq: Once | INTRAVENOUS | Status: AC | PRN
  Administered 2018-02-10: 100 mL via INTRAVENOUS

## 2018-02-10 MED ORDER — CLOPIDOGREL BISULFATE 75 MG PO TABS
75.0000 mg | ORAL_TABLET | Freq: Every day | ORAL | Status: DC
  Filled 2018-02-10: qty 1

## 2018-02-10 MED ORDER — ASPIRIN 81 MG PO CHEW: 81.0000 mg | CHEWABLE_TABLET | Freq: Every day | ORAL | Status: DC

## 2018-02-10 MED ORDER — ASPIRIN 325 MG PO TABS
ORAL_TABLET | ORAL | Status: AC
  Filled 2018-02-10: qty 1

## 2018-02-10 MED ORDER — ACETAMINOPHEN 325 MG PO TABS
650.0000 mg | ORAL_TABLET | ORAL | Status: DC | PRN
  Administered 2018-02-15 (×2): 650 mg via ORAL
  Filled 2018-02-10 (×2): qty 2

## 2018-02-10 MED ORDER — TIROFIBAN HCL IN NACL 5-0.9 MG/100ML-% IV SOLN
INTRAVENOUS | Status: AC
  Filled 2018-02-10: qty 100

## 2018-02-10 MED ORDER — SODIUM CHLORIDE 0.9 % IV SOLN
INTRAVENOUS | Status: DC | PRN
  Administered 2018-02-10: 12:00:00 via INTRAVENOUS

## 2018-02-10 MED ORDER — LIDOCAINE 2% (20 MG/ML) 5 ML SYRINGE
INTRAMUSCULAR | Status: DC | PRN
  Administered 2018-02-10: 60 mg via INTRAVENOUS

## 2018-02-10 MED ORDER — SODIUM CHLORIDE 0.9 % IV SOLN: INTRAVENOUS | Status: DC

## 2018-02-10 MED ORDER — INFLUENZA VAC SPLIT HIGH-DOSE 0.5 ML IM SUSY
0.5000 mL | PREFILLED_SYRINGE | INTRAMUSCULAR | Status: AC
  Administered 2018-02-11: 0.5 mL via INTRAMUSCULAR
  Filled 2018-02-10: qty 0.5

## 2018-02-10 MED ORDER — ASPIRIN EC 81 MG PO TBEC
DELAYED_RELEASE_TABLET | ORAL | Status: AC | PRN
  Administered 2018-02-10: 81 mg via ORAL

## 2018-02-10 MED ORDER — TICAGRELOR 60 MG PO TABS
ORAL_TABLET | ORAL | Status: AC | PRN
  Administered 2018-02-10: 180 mg

## 2018-02-10 MED ORDER — PROPOFOL 10 MG/ML IV BOLUS
INTRAVENOUS | Status: DC | PRN
  Administered 2018-02-10: 110 mg via INTRAVENOUS

## 2018-02-10 MED ORDER — CEFAZOLIN SODIUM-DEXTROSE 2-4 GM/100ML-% IV SOLN
INTRAVENOUS | Status: AC
  Filled 2018-02-10: qty 100

## 2018-02-10 MED ORDER — ESMOLOL HCL 100 MG/10ML IV SOLN
INTRAVENOUS | Status: DC | PRN
  Administered 2018-02-10: 5 mg via INTRAVENOUS

## 2018-02-10 MED ORDER — ROCURONIUM BROMIDE 10 MG/ML (PF) SYRINGE
PREFILLED_SYRINGE | INTRAVENOUS | Status: DC | PRN
  Administered 2018-02-10: 10 mg via INTRAVENOUS
  Administered 2018-02-10: 20 mg via INTRAVENOUS
  Administered 2018-02-10: 50 mg via INTRAVENOUS
  Administered 2018-02-10 (×2): 20 mg via INTRAVENOUS

## 2018-02-10 MED ORDER — ACETAMINOPHEN 650 MG RE SUPP
650.0000 mg | RECTAL | Status: DC | PRN
  Administered 2018-02-14: 650 mg via RECTAL
  Filled 2018-02-10: qty 1

## 2018-02-10 MED ORDER — CLEVIDIPINE BUTYRATE 0.5 MG/ML IV EMUL
INTRAVENOUS | Status: AC
  Administered 2018-02-10: 2 mg/h via INTRAVENOUS
  Filled 2018-02-10: qty 50

## 2018-02-10 MED ORDER — IOHEXOL 300 MG/ML  SOLN
150.0000 mL | Freq: Once | INTRAMUSCULAR | Status: AC | PRN
  Administered 2018-02-10: 75 mL via INTRA_ARTERIAL

## 2018-02-10 MED ORDER — ONDANSETRON HCL 4 MG/2ML IJ SOLN: 4.0000 mg | Freq: Once | INTRAMUSCULAR | Status: DC | PRN

## 2018-02-10 MED ORDER — FENTANYL CITRATE (PF) 100 MCG/2ML IJ SOLN: 25.0000 ug | INTRAMUSCULAR | Status: DC | PRN

## 2018-02-10 MED ORDER — CLEVIDIPINE BUTYRATE 0.5 MG/ML IV EMUL
0.0000 mg/h | INTRAVENOUS | Status: AC
  Administered 2018-02-10 (×2): 2 mg/h via INTRAVENOUS

## 2018-02-10 MED ORDER — ONDANSETRON HCL 4 MG/2ML IJ SOLN
INTRAMUSCULAR | Status: DC | PRN
  Administered 2018-02-10: 4 mg via INTRAVENOUS

## 2018-02-10 MED ORDER — EPTIFIBATIDE 20 MG/10ML IV SOLN
INTRAVENOUS | Status: AC
  Filled 2018-02-10: qty 10

## 2018-02-10 MED ORDER — SUGAMMADEX SODIUM 200 MG/2ML IV SOLN
INTRAVENOUS | Status: DC | PRN
  Administered 2018-02-10: 300 mg via INTRAVENOUS

## 2018-02-10 MED ORDER — FENTANYL CITRATE (PF) 100 MCG/2ML IJ SOLN
INTRAMUSCULAR | Status: AC
  Filled 2018-02-10: qty 2

## 2018-02-10 MED ORDER — ASPIRIN 81 MG PO CHEW
CHEWABLE_TABLET | ORAL | Status: AC
  Filled 2018-02-10: qty 1

## 2018-02-10 MED ORDER — DEXAMETHASONE SODIUM PHOSPHATE 10 MG/ML IJ SOLN
INTRAMUSCULAR | Status: DC | PRN
  Administered 2018-02-10: 10 mg via INTRAVENOUS

## 2018-02-10 MED ORDER — LIDOCAINE HCL 1 % IJ SOLN
INTRAMUSCULAR | Status: AC
  Filled 2018-02-10: qty 20

## 2018-02-10 MED ORDER — CEFAZOLIN SODIUM-DEXTROSE 2-3 GM-%(50ML) IV SOLR
INTRAVENOUS | Status: DC | PRN
  Administered 2018-02-10: 2 g via INTRAVENOUS

## 2018-02-10 MED ORDER — SODIUM CHLORIDE 0.9 % IV SOLN
INTRAVENOUS | Status: DC | PRN
  Administered 2018-02-10: 30 ug/min via INTRAVENOUS

## 2018-02-10 MED ORDER — IOHEXOL 300 MG/ML  SOLN
150.0000 mL | Freq: Once | INTRAMUSCULAR | Status: AC | PRN
  Administered 2018-02-10: 50 mL via INTRA_ARTERIAL

## 2018-02-10 MED ORDER — FENTANYL CITRATE (PF) 250 MCG/5ML IJ SOLN
INTRAMUSCULAR | Status: DC | PRN
  Administered 2018-02-10: 100 ug via INTRAVENOUS

## 2018-02-10 MED ORDER — NITROGLYCERIN 1 MG/10 ML FOR IR/CATH LAB
INTRA_ARTERIAL | Status: AC
  Filled 2018-02-10: qty 10

## 2018-02-10 MED ORDER — SENNOSIDES-DOCUSATE SODIUM 8.6-50 MG PO TABS: 1.0000 | ORAL_TABLET | Freq: Every evening | ORAL | Status: DC | PRN

## 2018-02-10 MED ORDER — CHLORHEXIDINE GLUCONATE 0.12 % MT SOLN
15.0000 mL | Freq: Two times a day (BID) | OROMUCOSAL | Status: DC
  Administered 2018-02-10 – 2018-02-16 (×10): 15 mL via OROMUCOSAL
  Filled 2018-02-10 (×6): qty 15

## 2018-02-10 MED ORDER — CLOPIDOGREL BISULFATE 75 MG PO TABS
75.0000 mg | ORAL_TABLET | Freq: Every day | ORAL | Status: DC
  Administered 2018-02-11 – 2018-02-16 (×6): 75 mg via ORAL
  Filled 2018-02-10 (×6): qty 1

## 2018-02-10 MED ORDER — SODIUM CHLORIDE 0.9 % IV SOLN
INTRAVENOUS | Status: DC
  Administered 2018-02-10 – 2018-02-13 (×4): via INTRAVENOUS

## 2018-02-10 MED ORDER — TICAGRELOR 90 MG PO TABS
ORAL_TABLET | ORAL | Status: AC
  Filled 2018-02-10: qty 2

## 2018-02-10 MED ORDER — ACETAMINOPHEN 650 MG RE SUPP: 650.0000 mg | RECTAL | Status: DC | PRN

## 2018-02-10 MED ORDER — STROKE: EARLY STAGES OF RECOVERY BOOK
Freq: Once | Status: AC
  Administered 2018-02-11: 05:00:00
  Filled 2018-02-10 (×2): qty 1

## 2018-02-10 MED ORDER — GLYCOPYRROLATE PF 0.2 MG/ML IJ SOSY
PREFILLED_SYRINGE | INTRAMUSCULAR | Status: DC | PRN
  Administered 2018-02-10 (×2): .2 mg via INTRAVENOUS

## 2018-02-10 MED ORDER — PHENYLEPHRINE 40 MCG/ML (10ML) SYRINGE FOR IV PUSH (FOR BLOOD PRESSURE SUPPORT)
PREFILLED_SYRINGE | INTRAVENOUS | Status: DC | PRN
  Administered 2018-02-10: 80 ug via INTRAVENOUS

## 2018-02-10 MED ORDER — ORAL CARE MOUTH RINSE
15.0000 mL | Freq: Two times a day (BID) | OROMUCOSAL | Status: DC
  Administered 2018-02-12 – 2018-02-16 (×8): 15 mL via OROMUCOSAL

## 2018-02-10 NOTE — Transfer of Care (Signed)
Immediate Anesthesia Transfer of Care Note  Patient: Brandy [name]  Procedure(s) Performed: RADIOLOGY WITH ANESTHESIA (N/A )  Patient Location: PACU  Anesthesia Type:General  Level of Consciousness: awake, alert , oriented and patient cooperative  Airway & Oxygen Therapy: Patient Spontanous Breathing and Patient connected to face mask oxygen  Post-op Assessment: Report given to RN, Post -op Vital signs reviewed and stable and Patient moving all extremities  Post vital signs: Reviewed and stable  Last Vitals:  Vitals Value Taken Time  BP 122/71 02/10/2018  4:37 PM  Temp 36.3 C 02/10/2018  4:37 PM  Pulse 80 02/10/2018  4:42 PM  Resp 14 02/10/2018  4:42 PM  SpO2 100 % 02/10/2018  4:42 PM  Vitals shown include unvalidated device data.  Last Pain:  Vitals:   02/10/18 1637  TempSrc:   PainSc: Asleep         Complications: No apparent anesthesia complications

## 2018-02-10 NOTE — H&P (Addendum)
NEURO HOSPITALIST  H&P   Chief Complaint: drooling, left side weakness  History obtained from:  Chart   HPI:                                                                                                                                         Brandy [name] is an 77 y.o. female  With PMH significant for TIA, HTN, CHF, stroke ( April 2019), and tobacco abuse who presented to [facility] with c/o of left sided weakness and gaze to the right. Transferred to [facility] for thrombectomy.  Per husband patient was lying in the bed and she was fine. When he checked on her an hour later she was found to be drooling with left arm and leg weakness. With a gaze to the right. Patient does take a baby aspirin daily.   ED course:  CTA: right M 1 occlusion; fair to good collateralization CTH: no hemorrhage; hyperdense right M1 MCA  Stroke history: April 2019: left MCA stroke with left M2 occlusion.  Date last known well: Date: 02/09/2018 Time last known well: Time: 21:00 tPA Given: No: outside of window Modified Rankin: Rankin Score=1 NIHSS:16; initial 15 @ [facility]     Past Medical History:  Diagnosis Date  . Cardiomyopathy    a. EF of 25% in 07/2006 b. EF normalized by repeat echo in 2011 c. EF 35-40% by echo in 05/2017 with cath showing mild nonobstructive CAD  . Chronic combined systolic and diastolic CHF (congestive heart failure) ([place])   . Colon polyps   . Diverticulosis of colon   . Hypertension   . Hypertensive heart disease   . Osteopenia   . TIA (transient ischemic attack)   . Tobacco abuse    50 pack year    Past Surgical History:  Procedure Laterality Date  . BREAST BIOPSY    . CARPAL TUNNEL RELEASE     right hand  . CERVICAL FUSION    . CESAREAN SECTION     2 times  . COLONOSCOPY  6629   pt uncertain as to whether polypectomy required or performed  . ECTOPIC PREGNANCY SURGERY    . RIGHT/LEFT HEART CATH AND CORONARY  ANGIOGRAPHY N/A 06/03/2017   Procedure: RIGHT/LEFT HEART CATH AND CORONARY ANGIOGRAPHY;  Surgeon: Jettie Booze, MD;  Location: [place] [facility];  Service: Cardiovascular;  Laterality: N/A;  . TONSILLECTOMY      Family History  Problem Relation Age of Onset  . Hypertension Brother   . Hypertension Maternal Grandmother   . Cancer Paternal Grandmother          Social History:  reports that she quit smoking about 9 years ago. Her smoking use included cigarettes. She started smoking about 57 years ago. She has a 50.00 pack-year smoking history. She has never used smokeless tobacco. She reports that she does not drink alcohol or use drugs.  Allergies:  Allergies  Allergen Reactions  . Lisinopril Swelling    Medications:                                                                                                                           Current Facility-Administered Medications  Medication Dose Route Frequency Provider Last Rate Last Dose  .  stroke: mapping our early stages of recovery book   Does not apply Once Williams, Jessica N, NP      . 0.9 %  sodium chloride infusion   Intravenous Continuous Vonzella Nipple, NP      . acetaminophen (TYLENOL) tablet 650 mg  650 mg Oral Q4H PRN Vonzella Nipple, NP       Or  . acetaminophen (TYLENOL) solution 650 mg  650 mg Per Tube Q4H PRN Vonzella Nipple, NP       Or  . acetaminophen (TYLENOL) suppository 650 mg  650 mg Rectal Q4H PRN Vonzella Nipple, NP      . fentaNYL (SUBLIMAZE) 100 MCG/2ML injection           . nitroGLYCERIN 100 mcg/mL intra-arterial injection           . senna-docusate (Senokot-S) tablet 1 tablet  1 tablet Oral QHS PRN Vonzella Nipple, NP       Facility-Administered Medications Ordered in Other Encounters  Medication Dose Route Frequency Provider Last Rate Last Dose  . 0.9 %  sodium chloride infusion    Continuous PRN Myna Bright, CRNA      . aspirin 325 MG tablet           .  clopidogrel (PLAVIX) 300 MG tablet           . eptifibatide (INTEGRILIN) 20 MG/10ML injection           . lidocaine (XYLOCAINE) 1 % (with pres) injection           . nitroGLYCERIN 100 mcg/mL intra-arterial injection           . ticagrelor (BRILINTA) 90 MG tablet           . tirofiban (AGGRASTAT) 5-0.9 MG/100ML-% injection             ROS:  History obtained from chart review  General Examination:                                                                                                      Blood pressure (!) 158/77, pulse 62, temperature 98.1 F (36.7 C), temperature source Oral, resp. rate 17, height 5\' 4"  (1.626 m), weight 66.7 kg, SpO2 98 %.  HEENT-  Normocephalic, no lesions, without obvious abnormality.  Normal external eye and conjunctiva.  Cardiovascular- S1-S2 audible, pulses palpable throughout   Lungs-no rhonchi or wheezing noted, no excessive working breathing.  Saturations within normal limits Abdomen- All 4 quadrants palpated and nontender Extremities- Warm, dry and intact Musculoskeletal-no joint tenderness, deformity or swelling Skin-warm and dry, no hyperpigmentation, vitiligo, or suspicious lesions  Neurological Examination    Lab Results: Basic Metabolic Panel: Recent Labs  Lab 02/10/18 1036 02/10/18 1044  NA 144 146*  K 3.7 3.7  CL 110 106  CO2 27  --   GLUCOSE 89 88  BUN 13 12  CREATININE 1.05* 1.10*  CALCIUM 8.8*  --     CBC: Recent Labs  Lab 02/10/18 1036 02/10/18 1044  WBC 4.8  --   NEUTROABS 2.8  --   HGB 11.3* 11.6*  HCT 36.9 34.0*  MCV 91.6  --   PLT 183  --     CBG: Recent Labs  Lab 02/10/18 1041  GLUCAP 75    Imaging: Ct Angio Head W Or Wo Contrast  Result Date: 02/10/2018 CLINICAL DATA:  Acute onset of LEFT-sided weakness. Slurred speech. Last seen normal earlier 0930 hours. EXAM: CT  ANGIOGRAPHY HEAD AND NECK CT PERFUSION BRAIN TECHNIQUE: Multidetector CT imaging of the head and neck was performed using the standard protocol during bolus administration of intravenous contrast. Multiplanar CT image reconstructions and MIPs were obtained to evaluate the vascular anatomy. Carotid stenosis measurements (when applicable) are obtained utilizing NASCET criteria, using the distal internal carotid diameter as the denominator. Multiphase CT imaging of the brain was performed following IV bolus contrast injection. Subsequent parametric perfusion maps were calculated using RAPID software. CONTRAST:  170mL ISOVUE-370 IOPAMIDOL (ISOVUE-370) INJECTION 76% COMPARISON:  Code stroke CT head earlier today. FINDINGS: CTA NECK FINDINGS Aortic arch: Standard branching. Imaged portion shows no evidence of aneurysm or dissection. No significant stenosis of the major arch vessel origins. Right carotid system: No evidence of dissection, stenosis (50% or greater) or occlusion. Left carotid system: No evidence of dissection, stenosis (50% or greater) or occlusion. Vertebral arteries: Codominant. No evidence of dissection, stenosis (50% or greater) or occlusion. Skeleton: Spondylosis.  Prior C5-C7 ACDF. Other neck: No masses. Upper chest: Mild edema.  No pneumothorax. Review of the MIP images confirms the above findings CTA HEAD FINDINGS Anterior circulation: Minor calcific atheromatous change in the LEFT carotid siphon. No significant calcification on the RIGHT. ICA termini widely patent. Acute RIGHT M1 MCA occlusion. No similar findings on the LEFT. Collateralization provides fair to good opacification of the M2 and M3 branches on the RIGHT. Posterior circulation: No significant stenosis, proximal occlusion, aneurysm, or vascular malformation. Venous sinuses:  As permitted by contrast timing, patent. Anatomic variants: None significant. Delayed phase: Not performed. Review of the MIP images confirms the above findings CT  Brain Perfusion Findings: CBF (<30%) Volume: 9.53mL Perfusion (Tmax>6.0s) volume: 102.81mL Mismatch Volume: 93.43mL Infarction Location:RIGHT basal ganglia, and regional white matter. IMPRESSION: Acute RIGHT M1 MCA occlusion.  Fair to good collateralization. No extracranial stenosis/dissection or carotid siphon stenosis. Significant mismatch volume of 93 mL, encompassing much of the RIGHT MCA territory. See discussion above. These results were called by telephone at the time of interpretation on 02/10/2018 at 11:13 am to Dr. Francine Graven , who verbally acknowledged these results. Electronically Signed   By: Staci Righter M.D.   On: 02/10/2018 11:36   Ct Angio Neck W Or Wo Contrast  Result Date: 02/10/2018 CLINICAL DATA:  Acute onset of LEFT-sided weakness. Slurred speech. Last seen normal earlier 0930 hours. EXAM: CT ANGIOGRAPHY HEAD AND NECK CT PERFUSION BRAIN TECHNIQUE: Multidetector CT imaging of the head and neck was performed using the standard protocol during bolus administration of intravenous contrast. Multiplanar CT image reconstructions and MIPs were obtained to evaluate the vascular anatomy. Carotid stenosis measurements (when applicable) are obtained utilizing NASCET criteria, using the distal internal carotid diameter as the denominator. Multiphase CT imaging of the brain was performed following IV bolus contrast injection. Subsequent parametric perfusion maps were calculated using RAPID software. CONTRAST:  146mL ISOVUE-370 IOPAMIDOL (ISOVUE-370) INJECTION 76% COMPARISON:  Code stroke CT head earlier today. FINDINGS: CTA NECK FINDINGS Aortic arch: Standard branching. Imaged portion shows no evidence of aneurysm or dissection. No significant stenosis of the major arch vessel origins. Right carotid system: No evidence of dissection, stenosis (50% or greater) or occlusion. Left carotid system: No evidence of dissection, stenosis (50% or greater) or occlusion. Vertebral arteries: Codominant. No  evidence of dissection, stenosis (50% or greater) or occlusion. Skeleton: Spondylosis.  Prior C5-C7 ACDF. Other neck: No masses. Upper chest: Mild edema.  No pneumothorax. Review of the MIP images confirms the above findings CTA HEAD FINDINGS Anterior circulation: Minor calcific atheromatous change in the LEFT carotid siphon. No significant calcification on the RIGHT. ICA termini widely patent. Acute RIGHT M1 MCA occlusion. No similar findings on the LEFT. Collateralization provides fair to good opacification of the M2 and M3 branches on the RIGHT. Posterior circulation: No significant stenosis, proximal occlusion, aneurysm, or vascular malformation. Venous sinuses: As permitted by contrast timing, patent. Anatomic variants: None significant. Delayed phase: Not performed. Review of the MIP images confirms the above findings CT Brain Perfusion Findings: CBF (<30%) Volume: 9.22mL Perfusion (Tmax>6.0s) volume: 102.71mL Mismatch Volume: 93.72mL Infarction Location:RIGHT basal ganglia, and regional white matter. IMPRESSION: Acute RIGHT M1 MCA occlusion.  Fair to good collateralization. No extracranial stenosis/dissection or carotid siphon stenosis. Significant mismatch volume of 93 mL, encompassing much of the RIGHT MCA territory. See discussion above. These results were called by telephone at the time of interpretation on 02/10/2018 at 11:13 am to Dr. Francine Graven , who verbally acknowledged these results. Electronically Signed   By: Staci Righter M.D.   On: 02/10/2018 11:36   Ct Cerebral Perfusion W Contrast  Result Date: 02/10/2018 CLINICAL DATA:  Acute onset of LEFT-sided weakness. Slurred speech. Last seen normal earlier 0930 hours. EXAM: CT ANGIOGRAPHY HEAD AND NECK CT PERFUSION BRAIN TECHNIQUE: Multidetector CT imaging of the head and neck was performed using the standard protocol during bolus administration of intravenous contrast. Multiplanar CT image reconstructions and MIPs were obtained to evaluate the  vascular anatomy. Carotid stenosis measurements (when  applicable) are obtained utilizing NASCET criteria, using the distal internal carotid diameter as the denominator. Multiphase CT imaging of the brain was performed following IV bolus contrast injection. Subsequent parametric perfusion maps were calculated using RAPID software. CONTRAST:  128mL ISOVUE-370 IOPAMIDOL (ISOVUE-370) INJECTION 76% COMPARISON:  Code stroke CT head earlier today. FINDINGS: CTA NECK FINDINGS Aortic arch: Standard branching. Imaged portion shows no evidence of aneurysm or dissection. No significant stenosis of the major arch vessel origins. Right carotid system: No evidence of dissection, stenosis (50% or greater) or occlusion. Left carotid system: No evidence of dissection, stenosis (50% or greater) or occlusion. Vertebral arteries: Codominant. No evidence of dissection, stenosis (50% or greater) or occlusion. Skeleton: Spondylosis.  Prior C5-C7 ACDF. Other neck: No masses. Upper chest: Mild edema.  No pneumothorax. Review of the MIP images confirms the above findings CTA HEAD FINDINGS Anterior circulation: Minor calcific atheromatous change in the LEFT carotid siphon. No significant calcification on the RIGHT. ICA termini widely patent. Acute RIGHT M1 MCA occlusion. No similar findings on the LEFT. Collateralization provides fair to good opacification of the M2 and M3 branches on the RIGHT. Posterior circulation: No significant stenosis, proximal occlusion, aneurysm, or vascular malformation. Venous sinuses: As permitted by contrast timing, patent. Anatomic variants: None significant. Delayed phase: Not performed. Review of the MIP images confirms the above findings CT Brain Perfusion Findings: CBF (<30%) Volume: 9.34mL Perfusion (Tmax>6.0s) volume: 102.75mL Mismatch Volume: 93.22mL Infarction Location:RIGHT basal ganglia, and regional white matter. IMPRESSION: Acute RIGHT M1 MCA occlusion.  Fair to good collateralization. No extracranial  stenosis/dissection or carotid siphon stenosis. Significant mismatch volume of 93 mL, encompassing much of the RIGHT MCA territory. See discussion above. These results were called by telephone at the time of interpretation on 02/10/2018 at 11:13 am to Dr. Francine Graven , who verbally acknowledged these results. Electronically Signed   By: Staci Righter M.D.   On: 02/10/2018 11:36   Ct Head Code Stroke Wo Contrast  Result Date: 02/10/2018 CLINICAL DATA:  Code stroke. LEFT-sided weakness. Patient drooling in bed. Last seen normal 0930 hours EXAM: CT HEAD WITHOUT CONTRAST TECHNIQUE: Contiguous axial images were obtained from the base of the skull through the vertex without intravenous contrast. COMPARISON:  CT head 07/12/2017. MR head and MRA intracranial 07/13/2017. CT angiography and CT perfusion reported separately. FINDINGS: Brain: Well-demarcated areas of cortical hypodensity affecting the RIGHT temporal lobe, and RIGHT posterior frontal lobe, most consistent with subacute infarction. Ill-defined areas of low-attenuation in the RIGHT frontal cortex, RIGHT insula, and suspected RIGHT basal ganglia consistent with acute infarction. No hemorrhage, mass lesion, hydrocephalus, or extra-axial fluid. Chronic LEFT hemisphere infarcts. Vascular: Hyperdense M1 MCA Skull: Normal. Negative for fracture or focal lesion. Sinuses/Orbits: No acute finding. Other: None ASPECTS ([place] Stroke Program Early CT Score) - Ganglionic level infarction (caudate, lentiform nuclei, internal capsule, insula, M1-M3 cortex): 4 - Supraganglionic infarction (M4-M6 cortex): 3 Total score (0-10 with 10 being normal): 7 IMPRESSION: 1. Mixed pattern of both acute and suspected subacute RIGHT hemisphere ischemia, in the setting of hyperdense RIGHT M1 MCA and acute LEFT-sided weakness, consistent with emergent large vessel occlusion of the RIGHT proximal MCA. No hemorrhage. 2. ASPECTS is 7. These results were called by telephone at the time  of interpretation on 02/10/2018 at 11:24 am to Dr. Francine Graven , who verbally acknowledged these results. Electronically Signed   By: Staci Righter M.D.   On: 02/10/2018 11:25   Laurey Morale, MSN, NP-C [facility] [phone]   02/10/2018, 12:33  PM   Attending physician note to follow with Assessment and plan .   Assessment: 77 y.o. female With PMH significant for TIA, HTN, CHF, stroke ( April 2019), and tobacco abuse who presented to [facility] with c/o of left sided weakness and gaze to the right. Transferred to [facility] for thrombectomy.     Plan: -- BP goal : per neuro IR recommendations --MRI Brain  --CTA --Echocardiogram -- Prophylactic therapy-Antiplatelet med -- High intensity Statin if LDL > 70 -- HgbA1c, fasting lipid panel -- PT consult, OT consult, Speech consult --Telemetry monitoring --Frequent neuro checks --Stroke swallow screen   CNS -Close neuro monitoring  RESP No acute issue Continue to monitor.  CV Essential (primary) hypertension  GI/GU -Gentle hydration -doc/senna  ENDO -goal HgbA1c < 7  ID Possible Aspiration PNA -CXR -NPO -Monitor  Prophylaxis DVT: SCD GI: doc/senna   Dispo: TBD  Diet: NPO until cleared by speech or bedside swallow eval  Code Status: Full Code    --please page stroke NP  Or  PA  Or MD from 8am -4 pm  as this patient from this time will be  followed by the stroke.   You can look them up on [email or web address]  Password [email or web address]  NEUROHOSPITALIST ADDENDUM Performed a face to face diagnostic evaluation.   I have reviewed the contents of history and physical exam as documented by PA/ARNP/Resident and agree with above documentation.  I have discussed and formulated the above plan as documented. Edits to the note have been made as needed.  Patient presented to the ER at [facility] after family noticed her to be not moving left side but waking up this morning.  According to the ER at any [facility]  patient was last seen normal was 9 PM last night.  Husband noted her to be down moving her left side although initially he thought there was nothing wrong with her when he started 9:30 AM she was laying in the bed.  Patient underwent CT angiogram and CT perfusion at [facility] which showed a perfusion mismatch with core of 9 mL and penumbra of 102 mL.  Patient was transported to [facility] for Neurointervention.    IR procedure was complicated, minimal recanalization of occluded right MCA segment.  Currently in ICU-has been extubated.  Remains significantly weak on the left side but alert and awake and following commands.   Karena Addison Machaela Caterino MD [facility] [phone]   If 7pm to 7am, please call on call as listed on AMION.

## 2018-02-10 NOTE — ED Notes (Signed)
PT went to CT scan with Nurse at this time.

## 2018-02-10 NOTE — ED Triage Notes (Addendum)
Patient brought in by EMS, states spouse saw patient lying in bed and she was fine and came back an hour later and patient was drooling, not talking, and not moving left arm or leg. Last known well 0930 today. Dr Thurnell Garbe at bedside. Patient has left sided weakness and gaze to the right with slurred speech.

## 2018-02-10 NOTE — Progress Notes (Signed)
Code Stroke Times  1022 Call time [place]; Images sent  9528 Exam completed; GR called

## 2018-02-10 NOTE — Code Documentation (Signed)
77yo female arriving to [facility] via [facility] at 1215. Patient from [facility] ED for endovascular treatment of acute right M1 MCA occlusion. Stroke team at the bedside on arrival. Patient transported directly to IR with team on arrival to [facility]. NIHSS 16, see documentation for details and code stroke times. Patient to admit to ICU post-procedure. 4N ICU Charge RN made aware.

## 2018-02-10 NOTE — ED Notes (Signed)
Date and time results received: 02/10/18 1042 (use smartphrase ".now" to insert current time)  Test: Troponin Critical Value: 0.15  Name of Provider Notified: Thurnell Garbe     Notified Nurse

## 2018-02-10 NOTE — Progress Notes (Signed)
Patient ID: Brandy [name], female   DOB: February 20, 1941, 77 y.o.   MRN: [id number] INR. 77 Y RT H F LSW yesterday evening  Noted this am  Have  Lt sided weakness and RT gaze preference. CT Brain No ICH ASPECTS 7 CTA occluded RT MCA hyperdense sign. CTP Rapid Core of 9 ml with Tmax> 6.0 vol 102 ml. Mismatch 52ml .Mismatch of 11.3. Option of endovascular option reviewed with patient by me and neurohospitalist. The procedure,risks benefits and alternatives were discussed.Risks of ICH of 10 % ,worsening neuro function,vent dependency,death,inabiolity to revascularize , and vascular injury were reviewed . Spouse expressed understanding of the management plan.Informed witnessed consent was obtained.Arlean Hopping MD

## 2018-02-10 NOTE — Consult Note (Signed)
[facility] TeleNeurology Consult Services   TeleStroke Metrics: LKW: 2100 Door Time: 3154  [facility] Contacted: 0086  [facility] at Bedside: 1107 NIHSS: [id number] Decision on Alteplase: Not to give as her last known well time was last night. Interventional Candidate: Patient is a potential candidate based on CT perfusion study.  CTA of the head showed a right M1 occlusion.  I spoke with the transfer center at 11:21 AM.  They have already consulted neurology and interventional radiology at [facility].   Chief Complaint: Left-sided weakness and altered mental status   HPI: Asked to see this patient in emergent telemedicine consultation utilizing interactive audio and video technologies. ?Consultation was performed with assistance of ancillary / medical staff at bedside.   Verbal consent to perform the examination with telemedicine was obtained. Patient agreed to proceed with the consultation for acute stroke protocol.  77 year old right-handed Afro-American female who was brought to the emergency room by her husband for acute left-sided weakness and altered mental status.  Patient takes a baby aspirin at baseline.  She has a prior left MCA stroke with left M2 occlusion in April 2019.  She also has a history of nonischemic cardiomyopathy with an ejection fraction of around 35%.  Husband states that he last saw the patient fine at 9 PM last night.  Then at 9:30 AM, he found the patient drooling on the left side of her mouth and she had significant left-sided weakness.  She also had a right gaze preference.  Patient exhibiting a significant right MCA syndrome/stroke.   PMH: Hypertension, nonischemic cardiomyopathy with an ejection fraction of 35 to 40%, coronary artery disease, cervical spine fusion, and left MCA stroke in April 2019   SOC: Negative x3.  Patient smoked before in the past.  She is married.   [place]: Significant for hypertension.   ROS: 13 point review systems were  reviewed with the patient, and are all negative with the exception of the aforementioned in the history of present illness.   VS: Temperature is 98.1 F, pulse 66, respiration 18, blood pressure 153/89, oxygen saturation 96%   Exam: Patient is in no apparent distress.  Patient appears as stated age.  No obvious acute respiratory or cardiac distress.  Patient is well groomed and well-nourished. 1a- LOC: Keenly responsive - 0     1b- LOC questions: Answers both questions correctly - 0    1c- LOC commands- Performs both tasks correctly- 0    2- Gaze: Right gaze deviation - 2    3- Visual Fields: normal, no Visual field deficit - 0    4- Facial movements: left facial palsy - 2    5- Upper limb motor - left arm drift - 4    6- Lower limb motor - left leg drift - 4    7- Limb Coordination: absent ataxia - 0     8- Sensory: no sensory loss - 0     9- Language - No aphasia - 0     10- Speech - Moderate dysarthria - 1 11- Neglect / Extinction - left sided neglect - 2   NIHSS score: 15    Diagnostic Data: Sodium 146, potassium 3.7, BUN 12, creatinine 1.1, blood glucose 88, W BC 4.8, hemoglobin 9.3, platelets 183, PT 14.7, PTT 32  CT of the head showed no acute hemorrhage or large territory stroke.  CT perfusion study showed an approximate 102 mL area of penumbra within the right MCA with only a 9 mL area of core infarct.  This was by RAPID analysis.  CTA of the head showed a right M1 occlusion.   Medical Data Reviewed:   1.Data?reviewed include clinical labs, radiology,?and medical tests;   2.Tests?results discussed w/performing or interpreting physician;   3.Obtaining/reviewing old medical records;   4.Obtaining?case history from another source;   5.Independent?review of image, tracing, or specimen.    Medical Decision Making:   - Extensive number of diagnosis or management options are considered below.   - Extensive amount of complex data reviewed.   - High risk of complication and/or  morbidity or mortality are associated with differential diagnostic considerations below.   - There may be?uncertain?outcome and increased probability of prolonged functional impairment or high probability of severe prolonged functional impairment associated with some of these differential diagnosis.    Differential Diagnosis for Stroke:   1.?Cardioembolic?stroke   2. Small vessel disease/lacune   3. Thromboembolic, artery-to-artery mechanism   4.?Hypercoagulable?state-related infarct   5. Transient ischemic attack   6. Thrombotic mechanism, large artery disease    Assessment: 1.  Acute right MCA stroke with right M1 occlusion 2.  Prior left MCA stroke with left M2 occlusion in April 2019 3.  Hypertension 4.  Nonischemic cardiomyopathy with an ejection fraction of 35 to 40%   Recommendations: Patient will be transferred emergently to [facility] for consideration of right MCA mechanical thrombectomy. Transfer center is already aware and is coordinating the patient's transfer. Consult local neurology team upon her admission to the hospital. Given now that she has had bilateral MCA strokes this year, the possibility of paroxysmal atrial fibrillation given her cardiac history remains high for stroke etiology. Maintain the patient on telemetry to look for paroxysmal atrial for ablation. Check echocardiogram to gauge her cardiac function and to rule out LV thrombus. Check hemoglobin A1c and lipid panel. Consult PT, OT, and ST. Continue supportive care.  Thank you for allowing [facility] to participate in the care of your patient. Please call me, Dr. Dale [place], with any questions at [phone]. Case discussed with the ER staff and Dr. Thurnell Garbe.   Critical Care notation:   I was called to see this critical patient emergently. I personally evaluated this critical patient for acute stroke evaluation, and determining their eligibility for IV Alteplase and interventional therapies.  I have  spent approximately 11 minutes with the patient, including time at bedside, time discussing the case with other physicians, reviewing plan of care, and time independently reviewing the records and scans.

## 2018-02-10 NOTE — ED Notes (Signed)
Blood work handed to [employer] at this time.

## 2018-02-10 NOTE — Anesthesia Procedure Notes (Addendum)
Procedure Name: Intubation Date/Time: 02/10/2018 12:32 PM Performed by: Lavell Luster, CRNA Pre-anesthesia Checklist: Patient identified, Emergency Drugs available, Suction available and Patient being monitored Patient Re-evaluated:Patient Re-evaluated prior to induction Oxygen Delivery Method: Circle system utilized Preoxygenation: Pre-oxygenation with 100% oxygen Induction Type: IV induction Ventilation: Mask ventilation without difficulty Laryngoscope Size: Mac and 3 Grade View: Grade II Tube type: Subglottic suction tube Tube size: 7.0 mm Number of attempts: 1 Airway Equipment and Method: Stylet Placement Confirmation: ETT inserted through vocal cords under direct vision,  positive ETCO2 and breath sounds checked- equal and bilateral Secured at: 21 cm Tube secured with: Tape Dental Injury: Teeth and Oropharynx as per pre-operative assessment  Comments: Patient had dentures and a loose implant in place. Removed dentures and the loose implant and placed in denture cup, given to circulating RN.

## 2018-02-10 NOTE — Sedation Documentation (Signed)
Patient under the care of anesthesia 

## 2018-02-10 NOTE — Anesthesia Preprocedure Evaluation (Addendum)
Anesthesia Evaluation  Patient identified by MRN, date of birth, ID band Patient confused    Reviewed: Allergy & Precautions, Patient's Chart, lab work & pertinent test results, Unable to perform ROS - Chart review onlyPreop documentation limited or incomplete due to emergent nature of procedure.  Airway       Comment: No airway exam performed  Dental  (+) Upper Dentures, Lower Dentures   Pulmonary former smoker,    breath sounds clear to auscultation       Cardiovascular hypertension, Pt. on home beta blockers and Pt. on medications +CHF   Rhythm:Regular Rate:Normal   '19 TTE - Moderate LVH. EF 40%. Diffuse hypokinesis. Mild-mod MR. Left atrium was moderately dilated.  '19 Cath - Ost 2nd Diag to 2nd Diag lesion is 25% stenosed. Mid RCA lesion is 25% stenosed. LV end diastolic pressure is moderately elevated. There is no aortic valve stenosis. LV end diastolic pressure is normal. Hemodynamic findings consistent with mild pulmonary hypertension. CO 5.2 L/min; CI 2.86; Ao 94%; PA sat 65%, mean PA 29 mm Hg; PCWP 20 mm Hg    Neuro/Psych TIACVA, Residual Symptoms    GI/Hepatic   Endo/Other    Renal/GU Renal InsufficiencyRenal disease     Musculoskeletal   Abdominal   Peds  Hematology  (+) anemia ,   Anesthesia Other Findings   Reproductive/Obstetrics                            Anesthesia Physical Anesthesia Plan  ASA: IV and emergent  Anesthesia Plan: General   Post-op Pain Management:    Induction: Intravenous, Rapid sequence and Cricoid pressure planned  PONV Risk Score and Plan: 3 and Treatment may vary due to age or medical condition and Ondansetron  Airway Management Planned: Oral ETT  Additional Equipment: Arterial line  Intra-op Plan:   Post-operative Plan: Possible Post-op intubation/ventilation  Informed Consent: I have reviewed the patients History and  Physical, chart, labs and discussed the procedure including the risks, benefits and alternatives for the proposed anesthesia with the patient or authorized representative who has indicated his/her understanding and acceptance.   Dental advisory given  Plan Discussed with: CRNA and Anesthesiologist  Anesthesia Plan Comments:       Anesthesia Quick Evaluation

## 2018-02-10 NOTE — ED Provider Notes (Signed)
[facility] EMERGENCY DEPARTMENT Provider Note   CSN: [id number] Arrival date & time: 02/10/18  1031     History   Chief Complaint Chief Complaint  Patient presents with  . Code Stroke    HPI Brandy [name] is a 77 y.o. female.  The history is provided by the EMS personnel and the spouse. The history is limited by the condition of the patient (acuity of condition).  Pt was seen at 1030.  Per EMS report: EMS states pt saw spouse laying in bed and she "was fine." He went into another room and came back and noted her "drooling, not talking, and not moving her left side." EMS states pt LKW was 0930 today. Code Stroke called on pt's arrival to ED.    Past Medical History:  Diagnosis Date  . Cardiomyopathy    a. EF of 25% in 07/2006 b. EF normalized by repeat echo in 2011 c. EF 35-40% by echo in 05/2017 with cath showing mild nonobstructive CAD  . Chronic combined systolic and diastolic CHF (congestive heart failure) ([place])   . Colon polyps   . Diverticulosis of colon   . Hypertension   . Hypertensive heart disease   . Osteopenia   . TIA (transient ischemic attack)   . Tobacco abuse    50 pack year    Patient Active Problem List   Diagnosis Date Noted  . Health care maintenance 11/01/2017  . Non-ischemic cardiomyopathy ([place]) 09/14/2017  . Precordial chest pain 09/14/2017  . B12 deficiency 09/13/2017  . Tachycardia 09/13/2017  . Acute ischemic stroke ([place])   . Seasonal allergies 07/13/2017  . TIA (transient ischemic attack) 07/12/2017  . Anemia 07/12/2017  . Pneumonia 06/02/2017  . Pleural effusion 06/02/2017  . Acute on chronic systolic congestive heart failure ([place])   . Elevated troponin   . Other fatigue 05/04/2017  . Dyspnea 05/04/2017  . Hypertensive heart disease   . Chronic combined systolic and diastolic CHF (congestive heart failure) ([place])   . Hypertension   . HTN (hypertension) 01/22/2013  . TOBACCO ABUSE 02/18/2008  . CARDIOMYOPATHY 02/18/2008  .  DIVERTICULOSIS OF COLON 02/18/2008  . OSTEOPENIA 02/18/2008    Past Surgical History:  Procedure Laterality Date  . BREAST BIOPSY    . CARPAL TUNNEL RELEASE     right hand  . CERVICAL FUSION    . CESAREAN SECTION     2 times  . COLONOSCOPY  1779   pt uncertain as to whether polypectomy required or performed  . ECTOPIC PREGNANCY SURGERY    . RIGHT/LEFT HEART CATH AND CORONARY ANGIOGRAPHY N/A 06/03/2017   Procedure: RIGHT/LEFT HEART CATH AND CORONARY ANGIOGRAPHY;  Surgeon: Jettie Booze, MD;  Location: [facility];  Service: Cardiovascular;  Laterality: N/A;  . TONSILLECTOMY       OB History   None      Home Medications    Prior to Admission medications   Medication Sig Start Date End Date Taking? Authorizing Provider  aspirin EC 81 MG EC tablet Take 2 tablets (162 mg total) by mouth daily. 07/14/17   Barton Dubois, MD  atorvastatin (LIPITOR) 10 MG tablet TAKE 1 TABLET BY MOUTH EVERY DAY AT 6 PM 09/12/17   Herminio Commons, MD  BIDIL 20-37.5 MG tablet TAKE 1 TABLET BY MOUTH THREE TIMES DAILY 09/12/17   Herminio Commons, MD  carvedilol (COREG) 12.5 MG tablet Take 1 tablet (12.5 mg total) by mouth 2 (two) times daily with a meal. 08/25/17  Herminio Commons, MD  cetirizine (ZYRTEC) 10 MG tablet Take 10 mg by mouth daily as needed for allergies.     [provider]  furosemide (LASIX) 20 MG tablet Take 1 tablet (20 mg total) by mouth daily as needed for edema. 02/08/18 05/09/18  Strader, Fransisco Hertz, PA-C  predniSONE (DELTASONE) 5 MG tablet Take 5 mg by mouth daily as needed (for swelling and right knee pain).    [provider]  umeclidinium-vilanterol (ANORO ELLIPTA) 62.5-25 MCG/INH AEPB Inhale 1 puff into the lungs daily. 12/08/17   Lauraine Rinne, NP    Family History Family History  Problem Relation Age of Onset  . Hypertension Brother   . Hypertension Maternal Grandmother   . Cancer Paternal Grandmother     Social History Social  History   Tobacco Use  . Smoking status: Former Smoker    Packs/day: 1.00    Years: 50.00    Pack years: 50.00    Types: Cigarettes    Start date: 09/02/1960    Last attempt to quit: 03/29/2008    Years since quitting: 9.8  . Smokeless tobacco: Never Used  Substance Use Topics  . Alcohol use: No    Alcohol/week: 0.0 standard drinks  . Drug use: Never     Allergies   Lisinopril   Review of Systems Review of Systems  Unable to perform ROS: Acuity of condition     Physical Exam Updated Vital Signs BP (!) 153/89 (BP Location: Left Arm)   Pulse 66   Temp 98.1 F (36.7 C) (Oral)   Resp 18   Ht 5\' 4"  (1.626 m)   Wt 66.7 kg   SpO2 96%   BMI 25.23 kg/m    Patient Vitals for the past 24 hrs:  BP Temp Temp src Pulse Resp SpO2 Height Weight  02/10/18 1040 - - - 66 18 96 % - -  02/10/18 1036 (!) 153/89 98.1 F (36.7 C) Oral 67 19 94 % - -  02/10/18 1034 - - - - - - 5\' 4"  (1.626 m) 66.7 kg     Physical Exam 1035: Physical examination:  Nursing notes reviewed; Vital signs and O2 SAT reviewed;  Constitutional: Well developed, Well nourished, Well hydrated, In no acute distress; Head:  Normocephalic, atraumatic; Eyes: EOMI, PERRL, No scleral icterus; ENMT: Mouth and pharynx normal, Mucous membranes moist; Neck: Supple, Full range of motion, No lymphadenopathy; Cardiovascular: Regular rate and rhythm, No gallop; Respiratory: Breath sounds clear & equal bilaterally, No wheezes. Normal respiratory effort/excursion; Chest: Nontender, Movement normal; Abdomen: Soft, Nontender, Nondistended, Normal bowel sounds; Genitourinary: No CVA tenderness; Extremities: Peripheral pulses normal, No tenderness, No edema, No calf edema or asymmetry.; Neuro: Awake, alert. Speech clear. Right gaze preference and will not blink to threat left side of face. No grip left. LUE and LLE flaccid. Strength 4/5 RUE and RLE.; Skin: Color normal, Warm, Dry.    ED Treatments / Results  Labs (all labs ordered are  listed, but only abnormal results are displayed)   EKG EKG Interpretation  Date/Time:  Friday February 10 2018 10:34:49 EST Ventricular Rate:  67 PR Interval:    QRS Duration: 112 QT Interval:  437 QTC Calculation: 462 R Axis:   -68 Text Interpretation:  Sinus rhythm Left axis deviation Left anterior fascicular block Anterior infarct, old Baseline wander When compared with ECG of 09/28/2017 No significant change was found Confirmed by Francine Graven [phone]) on 02/10/2018 10:49:31 AM   Radiology   Procedures Procedures (including  critical care time)  Medications Ordered in ED Medications  iopamidol (ISOVUE-370) 76 % injection 100 mL (100 mLs Intravenous Contrast Given 02/10/18 1049)     Initial Impression / Assessment and Plan / ED Course  I have reviewed the triage vital signs and the nursing notes.  Pertinent labs & imaging results that were available during my care of the patient were reviewed by me and considered in my medical decision making (see chart for details).  MDM Reviewed: previous chart, nursing note and vitals Reviewed previous: labs and ECG Interpretation: labs, ECG and CT scan Total time providing critical care: 30-74 minutes. This excludes time spent performing separately reportable procedures and services. Consults: neurology and admitting MD    CRITICAL CARE Performed by: Francine Graven Total critical care time: 40 minutes Critical care time was exclusive of separately billable procedures and treating other patients. Critical care was necessary to treat or prevent imminent or life-threatening deterioration. Critical care was time spent personally by me on the following activities: development of treatment plan with patient and/or surrogate as well as nursing, discussions with consultants, evaluation of patient's response to treatment, examination of patient, obtaining history from patient or surrogate, ordering and performing treatments and  interventions, ordering and review of laboratory studies, ordering and review of radiographic studies, pulse oximetry and re-evaluation of patient's condition.   Results for orders placed or performed during the hospital encounter of 02/10/18  Ethanol  Result Value Ref Range   Alcohol, Ethyl (B) <10 <10 mg/dL  Protime-INR  Result Value Ref Range   Prothrombin Time 14.7 11.4 - 15.2 seconds   INR 1.17   APTT  Result Value Ref Range   aPTT 32 24 - 36 seconds  CBC  Result Value Ref Range   WBC 4.8 4.0 - 10.5 K/uL   RBC 4.03 3.87 - 5.11 MIL/uL   Hemoglobin 11.3 (L) 12.0 - 15.0 g/dL   HCT 36.9 36.0 - 46.0 %   MCV 91.6 80.0 - 100.0 fL   MCH 28.0 26.0 - 34.0 pg   MCHC 30.6 30.0 - 36.0 g/dL   RDW 15.1 11.5 - 15.5 %   Platelets 183 150 - 400 K/uL   nRBC 0.0 0.0 - 0.2 %  Differential  Result Value Ref Range   Neutrophils Relative % 59 %   Neutro Abs 2.8 1.7 - 7.7 K/uL   Lymphocytes Relative 26 %   Lymphs Abs 1.3 0.7 - 4.0 K/uL   Monocytes Relative 11 %   Monocytes Absolute 0.5 0.1 - 1.0 K/uL   Eosinophils Relative 3 %   Eosinophils Absolute 0.2 0.0 - 0.5 K/uL   Basophils Relative 1 %   Basophils Absolute 0.0 0.0 - 0.1 K/uL   Immature Granulocytes 0 %   Abs Immature Granulocytes 0.01 0.00 - 0.07 K/uL  Comprehensive metabolic panel  Result Value Ref Range   Sodium 144 135 - 145 mmol/L   Potassium 3.7 3.5 - 5.1 mmol/L   Chloride 110 98 - 111 mmol/L   CO2 27 22 - 32 mmol/L   Glucose, Bld 89 70 - 99 mg/dL   BUN 13 8 - 23 mg/dL   Creatinine, Ser 1.05 (H) 0.44 - 1.00 mg/dL   Calcium 8.8 (L) 8.9 - 10.3 mg/dL   Total Protein 5.9 (L) 6.5 - 8.1 g/dL   Albumin 3.5 3.5 - 5.0 g/dL   AST 18 15 - 41 U/L   ALT 14 0 - 44 U/L   Alkaline Phosphatase 54 38 - 126  U/L   Total Bilirubin 0.8 0.3 - 1.2 mg/dL   GFR calc non Af Amer 51 (L) >60 mL/min   GFR calc Af Amer 59 (L) >60 mL/min   Anion gap 7 5 - 15  Glucose, capillary  Result Value Ref Range   Glucose-Capillary 75 70 - 99 mg/dL    Comment 1 Notify RN   I-STAT, chem 8  Result Value Ref Range   Sodium 146 (H) 135 - 145 mmol/L   Potassium 3.7 3.5 - 5.1 mmol/L   Chloride 106 98 - 111 mmol/L   BUN 12 8 - 23 mg/dL   Creatinine, Ser 1.10 (H) 0.44 - 1.00 mg/dL   Glucose, Bld 88 70 - 99 mg/dL   Calcium, Ion 1.20 1.15 - 1.40 mmol/L   TCO2 28 22 - 32 mmol/L   Hemoglobin 11.6 (L) 12.0 - 15.0 g/dL   HCT 34.0 (L) 36.0 - 46.0 %  POCT i-Stat troponin I  Result Value Ref Range   Troponin i, poc 0.15 (HH) 0.00 - 0.08 ng/mL   Comment NOTIFIED PHYSICIAN    Comment 3           Ct Head Code Stroke Wo Contrast Result Date: 02/10/2018 CLINICAL DATA:  Code stroke. LEFT-sided weakness. Patient drooling in bed. Last seen normal 0930 hours EXAM: CT HEAD WITHOUT CONTRAST TECHNIQUE: Contiguous axial images were obtained from the base of the skull through the vertex without intravenous contrast. COMPARISON:  CT head 07/12/2017. MR head and MRA intracranial 07/13/2017. CT angiography and CT perfusion reported separately. FINDINGS: Brain: Well-demarcated areas of cortical hypodensity affecting the RIGHT temporal lobe, and RIGHT posterior frontal lobe, most consistent with subacute infarction. Ill-defined areas of low-attenuation in the RIGHT frontal cortex, RIGHT insula, and suspected RIGHT basal ganglia consistent with acute infarction. No hemorrhage, mass lesion, hydrocephalus, or extra-axial fluid. Chronic LEFT hemisphere infarcts. Vascular: Hyperdense M1 MCA Skull: Normal. Negative for fracture or focal lesion. Sinuses/Orbits: No acute finding. Other: None ASPECTS ([place] Stroke Program Early CT Score) - Ganglionic level infarction (caudate, lentiform nuclei, internal capsule, insula, M1-M3 cortex): 4 - Supraganglionic infarction (M4-M6 cortex): 3 Total score (0-10 with 10 being normal): 7 IMPRESSION: 1. Mixed pattern of both acute and suspected subacute RIGHT hemisphere ischemia, in the setting of hyperdense RIGHT M1 MCA and acute LEFT-sided  weakness, consistent with emergent large vessel occlusion of the RIGHT proximal MCA. No hemorrhage. 2. ASPECTS is 7. These results were called by telephone at the time of interpretation on 02/10/2018 at 11:24 am to Dr. Francine Graven , who verbally acknowledged these results. Electronically Signed   By: Staci Righter M.D.   On: 02/10/2018 11:25     1050:  [name] husband is now in ED:  states pt LKW was last night 2100 when they went to bed.   1125:  Troponin chronically elevated. No acute changes on EKG today. T/C from [facility] MD: states pt has acute occlusion of right M1 MCA, will need Neuro/IR consult.  I immediately called [facility] Neuro MD with wet read/before full reading in Epic completed. T/C returned from [facility] Neuro Dr. Lorraine Lax, case discussed, including:  HPI, pertinent PM/SHx, VS/PE, dx testing, ED course and treatment:  he has viewed the images and agrees, transport pt to [facility] ED and he will notify IR of pt and call Code Stroke there. [facility] EDP Dr. Darl Householder called with report.        Final Clinical Impressions(s) / ED Diagnoses   Final diagnoses:  None  ED Discharge Orders    None       Francine Graven, DO 02/13/18 2158

## 2018-02-10 NOTE — ED Notes (Signed)
Belonging returned to husband

## 2018-02-10 NOTE — Anesthesia Procedure Notes (Signed)
Arterial Line Insertion Start/End11/15/2019 12:35 PM, 02/10/2018 12:35 PM Performed by: Lavell Luster, CRNA, CRNA  Preanesthetic checklist: patient identified, IV checked, site marked, risks and benefits discussed, surgical consent, monitors and equipment checked, pre-op evaluation and timeout performed Left was placed Catheter size: 20 G Hand hygiene performed , maximum sterile barriers used  and Seldinger technique used Allen's test indicative of satisfactory collateral circulation Attempts: 1 Procedure performed without using ultrasound guided technique. Following insertion, dressing applied and Biopatch. Post procedure assessment: normal  Patient tolerated the procedure well with no immediate complications.

## 2018-02-10 NOTE — ED Notes (Signed)
Teleneuro responding at this time.

## 2018-02-10 NOTE — ED Notes (Signed)
[facility] arrived to transport patient to [facility] ED.

## 2018-02-10 NOTE — Progress Notes (Signed)
Patient ID: Brandy [name], female   DOB: [birth year], 77 y.o.   MRN: [id number] INR. Post procedure CT brain shows focal hyper density at site of occlusion . No gross mass effect or midline shift noted. On extubation patient opened eyes to name and and moved RT LE to command. Pupils 58mm Rt = LT RT groin soft.No hematoma noted. Distal pulses palpable DPs and PTs bilaterally. S.Willie Loy MD

## 2018-02-10 NOTE — ED Notes (Signed)
[name] husband arrived at this time. [name] last known normal was 9pm last night. EDP and teleneurology nurse made aware of updated last known normal time.

## 2018-02-10 NOTE — Procedures (Signed)
S/P RT common carotid arteriogram,followed by x passes with embotrap 51mm x 73mm retriver device with minimal recanalizatiobn of occluded RT MCA M1 seg.  Focal  Neovascularity at site of occlusion ?moya moya like phenomenon dur to progressive severe underlying ASVD stenosis.

## 2018-02-10 NOTE — ED Notes (Signed)
Called [facility] charge for report, Pt will got straight to INR, wanted husbands cell number.  Macall Mccroskey- husband [phone]

## 2018-02-11 ENCOUNTER — Other Ambulatory Visit (HOSPITAL_COMMUNITY): Payer: Medicare Other

## 2018-02-11 ENCOUNTER — Inpatient Hospital Stay (HOSPITAL_COMMUNITY): Payer: Medicare Other

## 2018-02-11 ENCOUNTER — Encounter (HOSPITAL_COMMUNITY): Payer: Self-pay | Admitting: Interventional Radiology

## 2018-02-11 DIAGNOSIS — I5042 Chronic combined systolic (congestive) and diastolic (congestive) heart failure: Secondary | ICD-10-CM

## 2018-02-11 DIAGNOSIS — I639 Cerebral infarction, unspecified: Secondary | ICD-10-CM

## 2018-02-11 DIAGNOSIS — D62 Acute posthemorrhagic anemia: Secondary | ICD-10-CM

## 2018-02-11 DIAGNOSIS — Z981 Arthrodesis status: Secondary | ICD-10-CM

## 2018-02-11 DIAGNOSIS — N183 Chronic kidney disease, stage 3 (moderate): Secondary | ICD-10-CM

## 2018-02-11 DIAGNOSIS — I63511 Cerebral infarction due to unspecified occlusion or stenosis of right middle cerebral artery: Secondary | ICD-10-CM

## 2018-02-11 DIAGNOSIS — I6930 Unspecified sequelae of cerebral infarction: Secondary | ICD-10-CM

## 2018-02-11 DIAGNOSIS — E785 Hyperlipidemia, unspecified: Secondary | ICD-10-CM

## 2018-02-11 DIAGNOSIS — I255 Ischemic cardiomyopathy: Secondary | ICD-10-CM

## 2018-02-11 DIAGNOSIS — I6601 Occlusion and stenosis of right middle cerebral artery: Secondary | ICD-10-CM

## 2018-02-11 DIAGNOSIS — I25119 Atherosclerotic heart disease of native coronary artery with unspecified angina pectoris: Secondary | ICD-10-CM

## 2018-02-11 DIAGNOSIS — I10 Essential (primary) hypertension: Secondary | ICD-10-CM

## 2018-02-11 DIAGNOSIS — Z8673 Personal history of transient ischemic attack (TIA), and cerebral infarction without residual deficits: Secondary | ICD-10-CM

## 2018-02-11 LAB — LIPID PANEL
CHOL/HDL RATIO: 2.8 ratio
Cholesterol: 93 mg/dL (ref 0–200)
HDL: 33 mg/dL — ABNORMAL LOW (ref >40)
LDL Cholesterol: 52 mg/dL (ref 0–99)
Triglycerides: 42 mg/dL (ref <150)
VLDL: 8 mg/dL (ref 0–40)

## 2018-02-11 LAB — CBC WITH DIFFERENTIAL/PLATELET
Abs Immature Granulocytes: 0.02 10*3/uL (ref 0.00–0.07)
BASOS ABS: 0 10*3/uL (ref 0.0–0.1)
BASOS PCT: 0 %
Eosinophils Absolute: 0 10*3/uL (ref 0.0–0.5)
Eosinophils Relative: 0 %
HCT: 30.5 % — ABNORMAL LOW (ref 36.0–46.0)
Hemoglobin: 9.2 g/dL — ABNORMAL LOW (ref 12.0–15.0)
IMMATURE GRANULOCYTES: 0 %
Lymphocytes Relative: 7 %
Lymphs Abs: 0.5 10*3/uL — ABNORMAL LOW (ref 0.7–4.0)
MCH: 27.5 pg (ref 26.0–34.0)
MCHC: 30.2 g/dL (ref 30.0–36.0)
MCV: 91.3 fL (ref 80.0–100.0)
MONOS PCT: 3 %
Monocytes Absolute: 0.2 10*3/uL (ref 0.1–1.0)
NEUTROS PCT: 90 %
NRBC: 0 % (ref 0.0–0.2)
Neutro Abs: 6.8 10*3/uL (ref 1.7–7.7)
PLATELETS: 159 10*3/uL (ref 150–400)
RBC: 3.34 MIL/uL — AB (ref 3.87–5.11)
RDW: 15.2 % (ref 11.5–15.5)
WBC: 7.6 10*3/uL (ref 4.0–10.5)

## 2018-02-11 LAB — BASIC METABOLIC PANEL
ANION GAP: 7 (ref 5–15)
BUN: 12 mg/dL (ref 8–23)
CO2: 22 mmol/L (ref 22–32)
Calcium: 8.4 mg/dL — ABNORMAL LOW (ref 8.9–10.3)
Chloride: 113 mmol/L — ABNORMAL HIGH (ref 98–111)
Creatinine, Ser: 1.31 mg/dL — ABNORMAL HIGH (ref 0.44–1.00)
GFR, EST AFRICAN AMERICAN: 45 mL/min — AB (ref >60)
GFR, EST NON AFRICAN AMERICAN: 39 mL/min — AB (ref >60)
GLUCOSE: 99 mg/dL (ref 70–99)
POTASSIUM: 3.8 mmol/L (ref 3.5–5.1)
Sodium: 142 mmol/L (ref 135–145)

## 2018-02-11 LAB — HEMOGLOBIN A1C
Hgb A1c MFr Bld: 4.7 % — ABNORMAL LOW (ref 4.8–5.6)
MEAN PLASMA GLUCOSE: 88.19 mg/dL

## 2018-02-11 MED ORDER — ATORVASTATIN CALCIUM 10 MG PO TABS
10.0000 mg | ORAL_TABLET | Freq: Every day | ORAL | Status: DC
  Administered 2018-02-12 – 2018-02-15 (×5): 10 mg via ORAL
  Filled 2018-02-11 (×6): qty 1

## 2018-02-11 MED ORDER — ASPIRIN 81 MG PO CHEW: 324.0000 mg | CHEWABLE_TABLET | Freq: Every day | ORAL | Status: DC

## 2018-02-11 MED ORDER — ENOXAPARIN SODIUM 40 MG/0.4ML ~~LOC~~ SOLN
40.0000 mg | SUBCUTANEOUS | Status: DC
  Administered 2018-02-11 – 2018-02-16 (×5): 40 mg via SUBCUTANEOUS
  Filled 2018-02-11 (×5): qty 0.4

## 2018-02-11 MED ORDER — ASPIRIN 300 MG RE SUPP: 300.0000 mg | Freq: Once | RECTAL | Status: DC

## 2018-02-11 MED ORDER — ASPIRIN 325 MG PO TABS
325.0000 mg | ORAL_TABLET | Freq: Every day | ORAL | Status: DC
  Administered 2018-02-11 – 2018-02-16 (×5): 325 mg via ORAL
  Filled 2018-02-11 (×5): qty 1

## 2018-02-11 NOTE — Progress Notes (Signed)
STROKE TEAM PROGRESS NOTE   SUBJECTIVE (INTERVAL HISTORY) Her husband is at the bedside.  Patient lethargic, but easily arousable and fully orientated.  BP stable, off Cleviprex.  Passed a swallow, on diet.  MRI/MRI still pending.   OBJECTIVE Temp:  [97 F (36.1 C)-99.3 F (37.4 C)] 99.3 F (37.4 C) (11/16 0400) Pulse Rate:  [59-89] 81 (11/16 1000) Cardiac Rhythm: Normal sinus rhythm (11/16 0600) Resp:  [10-20] 16 (11/16 1000) BP: (99-128)/(53-73) 128/68 (11/16 1000) SpO2:  [93 %-100 %] 96 % (11/16 1000) Arterial Line BP: (116-153)/(45-66) 147/51 (11/16 0900) Weight:  [68.5 kg] 68.5 kg (11/15 1830)  Recent Labs  Lab 02/10/18 1041  GLUCAP 75   Recent Labs  Lab 02/10/18 1036 02/10/18 1044 02/11/18 0447  NA 144 146* 142  K 3.7 3.7 3.8  CL 110 106 113*  CO2 27  --  22  GLUCOSE 89 88 99  BUN 13 12 12   CREATININE 1.05* 1.10* 1.31*  CALCIUM 8.8*  --  8.4*   Recent Labs  Lab 02/10/18 1036  AST 18  ALT 14  ALKPHOS 54  BILITOT 0.8  PROT 5.9*  ALBUMIN 3.5   Recent Labs  Lab 02/10/18 1036 02/10/18 1044 02/11/18 0447  WBC 4.8  --  7.6  NEUTROABS 2.8  --  6.8  HGB 11.3* 11.6* 9.2*  HCT 36.9 34.0* 30.5*  MCV 91.6  --  91.3  PLT 183  --  159   No results for input(s): CKTOTAL, CKMB, CKMBINDEX, TROPONINI in the last 168 hours. Recent Labs    02/10/18 1036  LABPROT 14.7  INR 1.17   Recent Labs    02/10/18 2039  COLORURINE YELLOW  LABSPEC >1.046*  PHURINE 5.0  GLUCOSEU NEGATIVE  HGBUR NEGATIVE  BILIRUBINUR NEGATIVE  KETONESUR 20*  PROTEINUR NEGATIVE  NITRITE NEGATIVE  LEUKOCYTESUR NEGATIVE       Component Value Date/Time   CHOL 93 02/11/2018 0447   TRIG 42 02/11/2018 0447   HDL 33 (L) 02/11/2018 0447   CHOLHDL 2.8 02/11/2018 0447   VLDL 8 02/11/2018 0447   LDLCALC 52 02/11/2018 0447   Lab Results  Component Value Date   HGBA1C 4.7 (L) 02/11/2018      Component Value Date/Time   LABOPIA NONE DETECTED 02/10/2018 2000   COCAINSCRNUR NONE  DETECTED 02/10/2018 2000   LABBENZ NONE DETECTED 02/10/2018 2000   AMPHETMU NONE DETECTED 02/10/2018 2000   THCU NONE DETECTED 02/10/2018 2000   LABBARB NONE DETECTED 02/10/2018 2000    Recent Labs  Lab 02/10/18 1036  ETH <10    IMAGING  I have personally reviewed the radiological images below and agree with the radiology interpretations.  Ct Angio Head W Or Wo Contrast Ct Angio Neck W Or Wo Contrast Ct Cerebral Perfusion W Contrast 02/10/2018 IMPRESSION:  Acute RIGHT M1 MCA occlusion.  Fair to good collateralization. No extracranial stenosis/dissection or carotid siphon stenosis. Significant mismatch volume of 93 mL, encompassing much of the RIGHT MCA territory.   Ct Head Code Stroke Wo Contrast 02/10/2018 IMPRESSION:  1. Mixed pattern of both acute and suspected subacute RIGHT hemisphere ischemia, in the setting of hyperdense RIGHT M1 MCA and acute LEFT-sided weakness, consistent with emergent large vessel occlusion of the RIGHT proximal MCA. No hemorrhage.  2. ASPECTS is 7.    Cerebral Angiogram - IR - Dr Estanislado Pandy 02/10/2018 S/P RT common carotid arteriogram,followed by x passes with embotrap 10mm x 49mm retriver device with minimal recanalizatiobn of occluded RT MCA M1 seg.  Focal  Neovascularity at site of occlusion ?moya moya like phenomenon due to progressive severe underlying ASVD stenosis.   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  Ophthalmologic - fundi not visualized due to  noncooperation.  Cardiovascular - Regular rate and rhythm.  Neuro - awake, alert, but lethargic, eyes open, right gaze preference but able to cross midline, nystagmus with left gaze. Orientated to place, people and month but not the year. Paucity of speech, able to name 2/2 and repeat but moderate dysarthria. PERRL, full field full but with left simultagnosia. Left facial droop and tongue protrusion to the left. Left UE 0/5 and left LE trace withdraw with pain. RUE and RLE 4+/5. Left babinski positive. Sensation symmetrical, coordination not cooperative. Gait not tested.    ASSESSMENT/PLAN [name] is a 77 y.o. female with history of tobacco use, previous TIA, hypertension, cardiomyopathy with an EF of 25%, and congestive heart failure, admitted to [facility] with left-sided weakness and right gaze preference. No tPA given due to late presentation. Mechanical thrombectomy attempted but failed- minimal recanalizatiobn of occluded RT MCA M1 seg.   Stroke: R MCA infarct with right M1 occlusion failed IR, likely due to acute on chronic right Known M1 stenosis  Resultant right gaze and the left hemiplegia  MRI - pending  MRA  - pending  Head CT - right MCA infarcts with right M1 hyperdense signal  CTA H&N - Acute RIGHT M1 MCA occlusion, but previous left M2 occlusion resolved  CTP - Significant mismatch volume of 93 mL, encompassing much of the RIGHT MCA territory.   IR attempted but failed to recanalize right M1  2D Echo - pending  LDL - 52  HgbA1c - 4.7  UDS - negative   Lovenox for VTE prophylaxis  aspirin 81 mg daily prior to admission, now on aspirin 325 mg daily and clopidogrel 75 mg daily.   Patient counseled to be compliant with her antithrombotic medications  Ongoing aggressive stroke risk factor management  Therapy recommendations: pending  Disposition: pending  History of stroke  06/2017 admitted in [name], left MCA infarct with left M2  occlusion and right M1 distal high-grade stenosis, EF 40%.  Put on aspirin 162 and a statin on discharge  Cardiomyopathy  EF 35 to 40% in 05/2017  EF 40% in 06/2017  TTE pending this admission  Hypertension . Stable . Permissive hypertension (OK if <220/120) for 24-48 hours post stroke and then gradually normalized within 5-7 days.  Long term BP goal < 130/80  Hyperlipidemia  Home meds:  Lipitor 10 mg daily  LDL 52, goal < 70  Resume Lipitor  Continue statin at discharge  Other Stroke Risk Factors  Advanced age  Former cigarette smoker - quit  CAD  Other Active Problems  Post procedure anemia hemoglobin 11.6-9.2  Elevated Creatinine -1.10-1.31  COPD  Hospital day # 1  This patient is critically ill due to large right MCA stroke, history of stroke, right M1 occlusion, cardiomyopathy, CHF and at  significant risk of neurological worsening, death form recurrent stroke, hemorrhagic conversion, seizure, heart failure. This patient's care requires constant monitoring of vital signs, hemodynamics, respiratory and cardiac monitoring, review of multiple databases, neurological assessment, discussion with family, other specialists and medical decision making of high complexity. I spent 40 minutes of neurocritical care time in the care of this patient. I had long discussion with patient and her husband at bedside, updated pt current condition, treatment plan and potential prognosis. They expressed understanding and appreciation.    Rosalin Hawking, MD PhD Stroke Neurology 02/11/2018 11:31 AM    To contact Stroke Continuity provider, please refer to [email or web address] After hours, contact General Neurology

## 2018-02-11 NOTE — Progress Notes (Signed)
Supervising Physician: Luanne Bras  Patient Status: [facility] - In-pt  Subjective: S/P RT common carotid arteriogram,followed by x passes with embotrap 20mm x 38mm retriver device with minimal recanalizatiobn of occluded RT MCA M1 seg Pt extubated yesterday. Family at bedside.  Objective: Physical Exam: BP 128/68   Pulse 81   Temp 99.3 F (37.4 C) (Oral)   Resp 16   Ht 5\' 4"  (1.626 m)   Wt 68.5 kg   SpO2 96%   BMI 25.92 kg/m  Awake, talkative, speech not clear. Pupils 67mm = Tongue deviation to left. No movement left extremities. Good strength (R)UE (R)groin soft, NT, no hematoma   Current Facility-Administered Medications:  .  0.9 %  sodium chloride infusion, , Intravenous, Continuous, Deveshwar, Sanjeev, MD, Last Rate: 75 mL/hr at 02/11/18 0600 .  acetaminophen (TYLENOL) tablet 650 mg, 650 mg, Oral, Q4H PRN **OR** [DISCONTINUED] acetaminophen (TYLENOL) solution 650 mg, 650 mg, Per Tube, Q4H PRN **OR** acetaminophen (TYLENOL) suppository 650 mg, 650 mg, Rectal, Q4H PRN, Vonzella Nipple, NP .  aspirin tablet 325 mg, 325 mg, Oral, Daily, Rosalin Hawking, MD, 325 mg at 02/11/18 1044 .  chlorhexidine (PERIDEX) 0.12 % solution 15 mL, 15 mL, Mouth Rinse, BID, Aroor, Lanice Schwab, MD, 15 mL at 02/11/18 0936 .  clevidipine (CLEVIPREX) infusion 0.5 mg/mL, 0-21 mg/hr, Intravenous, Continuous, Rosalin Hawking, MD, Last Rate: 2 mL/hr at 02/11/18 0600, 1 mg/hr at 02/11/18 0600 .  clopidogrel (PLAVIX) tablet 75 mg, 75 mg, Oral, Daily, 75 mg at 02/11/18 0937 **OR** clopidogrel (PLAVIX) tablet 75 mg, 75 mg, Per Tube, Daily, Deveshwar, Sanjeev, MD .  enoxaparin (LOVENOX) injection 40 mg, 40 mg, Subcutaneous, Q24H, Rosalin Hawking, MD, 40 mg at 02/11/18 0937 .  MEDLINE mouth rinse, 15 mL, Mouth Rinse, q12n4p, Aroor, Karena Addison R, MD .  senna-docusate (Senokot-S) tablet 1 tablet, 1 tablet, Oral, QHS PRN, Vonzella Nipple, NP  Labs: CBC Recent Labs    02/10/18 1036 02/10/18 1044  02/11/18 0447  WBC 4.8  --  7.6  HGB 11.3* 11.6* 9.2*  HCT 36.9 34.0* 30.5*  PLT 183  --  159   BMET Recent Labs    02/10/18 1036 02/10/18 1044 02/11/18 0447  NA 144 146* 142  K 3.7 3.7 3.8  CL 110 106 113*  CO2 27  --  22  GLUCOSE 89 88 99  BUN 13 12 12   CREATININE 1.05* 1.10* 1.31*  CALCIUM 8.8*  --  8.4*   LFT Recent Labs    02/10/18 1036  PROT 5.9*  ALBUMIN 3.5  AST 18  ALT 14  ALKPHOS 54  BILITOT 0.8   PT/INR Recent Labs    02/10/18 1036  LABPROT 14.7  INR 1.17     Studies/Results: Ct Angio Head W Or Wo Contrast  Result Date: 02/10/2018 CLINICAL DATA:  Acute onset of LEFT-sided weakness. Slurred speech. Last seen normal earlier 0930 hours. EXAM: CT ANGIOGRAPHY HEAD AND NECK CT PERFUSION BRAIN TECHNIQUE: Multidetector CT imaging of the head and neck was performed using the standard protocol during bolus administration of intravenous contrast. Multiplanar CT image reconstructions and MIPs were obtained to evaluate the vascular anatomy. Carotid stenosis measurements (when applicable) are obtained utilizing NASCET criteria, using the distal internal carotid diameter as the denominator. Multiphase CT imaging of the brain was performed following IV bolus contrast injection. Subsequent parametric perfusion maps were calculated using RAPID software. CONTRAST:  152mL ISOVUE-370 IOPAMIDOL (ISOVUE-370) INJECTION 76% COMPARISON:  Code stroke CT head earlier today. FINDINGS:  CTA NECK FINDINGS Aortic arch: Standard branching. Imaged portion shows no evidence of aneurysm or dissection. No significant stenosis of the major arch vessel origins. Right carotid system: No evidence of dissection, stenosis (50% or greater) or occlusion. Left carotid system: No evidence of dissection, stenosis (50% or greater) or occlusion. Vertebral arteries: Codominant. No evidence of dissection, stenosis (50% or greater) or occlusion. Skeleton: Spondylosis.  Prior C5-C7 ACDF. Other neck: No masses.  Upper chest: Mild edema.  No pneumothorax. Review of the MIP images confirms the above findings CTA HEAD FINDINGS Anterior circulation: Minor calcific atheromatous change in the LEFT carotid siphon. No significant calcification on the RIGHT. ICA termini widely patent. Acute RIGHT M1 MCA occlusion. No similar findings on the LEFT. Collateralization provides fair to good opacification of the M2 and M3 branches on the RIGHT. Posterior circulation: No significant stenosis, proximal occlusion, aneurysm, or vascular malformation. Venous sinuses: As permitted by contrast timing, patent. Anatomic variants: None significant. Delayed phase: Not performed. Review of the MIP images confirms the above findings CT Brain Perfusion Findings: CBF (<30%) Volume: 9.73mL Perfusion (Tmax>6.0s) volume: 102.18mL Mismatch Volume: 93.55mL Infarction Location:RIGHT basal ganglia, and regional white matter. IMPRESSION: Acute RIGHT M1 MCA occlusion.  Fair to good collateralization. No extracranial stenosis/dissection or carotid siphon stenosis. Significant mismatch volume of 93 mL, encompassing much of the RIGHT MCA territory. See discussion above. These results were called by telephone at the time of interpretation on 02/10/2018 at 11:13 am to Dr. Francine Graven , who verbally acknowledged these results. Electronically Signed   By: Staci Righter M.D.   On: 02/10/2018 11:36   Ct Angio Neck W Or Wo Contrast  Result Date: 02/10/2018 CLINICAL DATA:  Acute onset of LEFT-sided weakness. Slurred speech. Last seen normal earlier 0930 hours. EXAM: CT ANGIOGRAPHY HEAD AND NECK CT PERFUSION BRAIN TECHNIQUE: Multidetector CT imaging of the head and neck was performed using the standard protocol during bolus administration of intravenous contrast. Multiplanar CT image reconstructions and MIPs were obtained to evaluate the vascular anatomy. Carotid stenosis measurements (when applicable) are obtained utilizing NASCET criteria, using the distal internal  carotid diameter as the denominator. Multiphase CT imaging of the brain was performed following IV bolus contrast injection. Subsequent parametric perfusion maps were calculated using RAPID software. CONTRAST:  168mL ISOVUE-370 IOPAMIDOL (ISOVUE-370) INJECTION 76% COMPARISON:  Code stroke CT head earlier today. FINDINGS: CTA NECK FINDINGS Aortic arch: Standard branching. Imaged portion shows no evidence of aneurysm or dissection. No significant stenosis of the major arch vessel origins. Right carotid system: No evidence of dissection, stenosis (50% or greater) or occlusion. Left carotid system: No evidence of dissection, stenosis (50% or greater) or occlusion. Vertebral arteries: Codominant. No evidence of dissection, stenosis (50% or greater) or occlusion. Skeleton: Spondylosis.  Prior C5-C7 ACDF. Other neck: No masses. Upper chest: Mild edema.  No pneumothorax. Review of the MIP images confirms the above findings CTA HEAD FINDINGS Anterior circulation: Minor calcific atheromatous change in the LEFT carotid siphon. No significant calcification on the RIGHT. ICA termini widely patent. Acute RIGHT M1 MCA occlusion. No similar findings on the LEFT. Collateralization provides fair to good opacification of the M2 and M3 branches on the RIGHT. Posterior circulation: No significant stenosis, proximal occlusion, aneurysm, or vascular malformation. Venous sinuses: As permitted by contrast timing, patent. Anatomic variants: None significant. Delayed phase: Not performed. Review of the MIP images confirms the above findings CT Brain Perfusion Findings: CBF (<30%) Volume: 9.14mL Perfusion (Tmax>6.0s) volume: 102.17mL Mismatch Volume: 93.66mL Infarction Location:RIGHT basal ganglia,  and regional white matter. IMPRESSION: Acute RIGHT M1 MCA occlusion.  Fair to good collateralization. No extracranial stenosis/dissection or carotid siphon stenosis. Significant mismatch volume of 93 mL, encompassing much of the RIGHT MCA territory.  See discussion above. These results were called by telephone at the time of interpretation on 02/10/2018 at 11:13 am to Dr. Francine Graven , who verbally acknowledged these results. Electronically Signed   By: Staci Righter M.D.   On: 02/10/2018 11:36   Ct Cerebral Perfusion W Contrast  Result Date: 02/10/2018 CLINICAL DATA:  Acute onset of LEFT-sided weakness. Slurred speech. Last seen normal earlier 0930 hours. EXAM: CT ANGIOGRAPHY HEAD AND NECK CT PERFUSION BRAIN TECHNIQUE: Multidetector CT imaging of the head and neck was performed using the standard protocol during bolus administration of intravenous contrast. Multiplanar CT image reconstructions and MIPs were obtained to evaluate the vascular anatomy. Carotid stenosis measurements (when applicable) are obtained utilizing NASCET criteria, using the distal internal carotid diameter as the denominator. Multiphase CT imaging of the brain was performed following IV bolus contrast injection. Subsequent parametric perfusion maps were calculated using RAPID software. CONTRAST:  193mL ISOVUE-370 IOPAMIDOL (ISOVUE-370) INJECTION 76% COMPARISON:  Code stroke CT head earlier today. FINDINGS: CTA NECK FINDINGS Aortic arch: Standard branching. Imaged portion shows no evidence of aneurysm or dissection. No significant stenosis of the major arch vessel origins. Right carotid system: No evidence of dissection, stenosis (50% or greater) or occlusion. Left carotid system: No evidence of dissection, stenosis (50% or greater) or occlusion. Vertebral arteries: Codominant. No evidence of dissection, stenosis (50% or greater) or occlusion. Skeleton: Spondylosis.  Prior C5-C7 ACDF. Other neck: No masses. Upper chest: Mild edema.  No pneumothorax. Review of the MIP images confirms the above findings CTA HEAD FINDINGS Anterior circulation: Minor calcific atheromatous change in the LEFT carotid siphon. No significant calcification on the RIGHT. ICA termini widely patent. Acute  RIGHT M1 MCA occlusion. No similar findings on the LEFT. Collateralization provides fair to good opacification of the M2 and M3 branches on the RIGHT. Posterior circulation: No significant stenosis, proximal occlusion, aneurysm, or vascular malformation. Venous sinuses: As permitted by contrast timing, patent. Anatomic variants: None significant. Delayed phase: Not performed. Review of the MIP images confirms the above findings CT Brain Perfusion Findings: CBF (<30%) Volume: 9.57mL Perfusion (Tmax>6.0s) volume: 102.45mL Mismatch Volume: 93.78mL Infarction Location:RIGHT basal ganglia, and regional white matter. IMPRESSION: Acute RIGHT M1 MCA occlusion.  Fair to good collateralization. No extracranial stenosis/dissection or carotid siphon stenosis. Significant mismatch volume of 93 mL, encompassing much of the RIGHT MCA territory. See discussion above. These results were called by telephone at the time of interpretation on 02/10/2018 at 11:13 am to Dr. Francine Graven , who verbally acknowledged these results. Electronically Signed   By: Staci Righter M.D.   On: 02/10/2018 11:36   Ct Head Code Stroke Wo Contrast  Result Date: 02/10/2018 CLINICAL DATA:  Code stroke. LEFT-sided weakness. Patient drooling in bed. Last seen normal 0930 hours EXAM: CT HEAD WITHOUT CONTRAST TECHNIQUE: Contiguous axial images were obtained from the base of the skull through the vertex without intravenous contrast. COMPARISON:  CT head 07/12/2017. MR head and MRA intracranial 07/13/2017. CT angiography and CT perfusion reported separately. FINDINGS: Brain: Well-demarcated areas of cortical hypodensity affecting the RIGHT temporal lobe, and RIGHT posterior frontal lobe, most consistent with subacute infarction. Ill-defined areas of low-attenuation in the RIGHT frontal cortex, RIGHT insula, and suspected RIGHT basal ganglia consistent with acute infarction. No hemorrhage, mass lesion, hydrocephalus, or extra-axial fluid.  Chronic LEFT  hemisphere infarcts. Vascular: Hyperdense M1 MCA Skull: Normal. Negative for fracture or focal lesion. Sinuses/Orbits: No acute finding. Other: None ASPECTS (Ogle Stroke Program Early CT Score) - Ganglionic level infarction (caudate, lentiform nuclei, internal capsule, insula, M1-M3 cortex): 4 - Supraganglionic infarction (M4-M6 cortex): 3 Total score (0-10 with 10 being normal): 7 IMPRESSION: 1. Mixed pattern of both acute and suspected subacute RIGHT hemisphere ischemia, in the setting of hyperdense RIGHT M1 MCA and acute LEFT-sided weakness, consistent with emergent large vessel occlusion of the RIGHT proximal MCA. No hemorrhage. 2. ASPECTS is 7. These results were called by telephone at the time of interpretation on 02/10/2018 at 11:24 am to Dr. Francine Graven , who verbally acknowledged these results. Electronically Signed   By: Staci Righter M.D.   On: 02/10/2018 11:25    Assessment/Plan: S/P RT common carotid arteriogram,followed by x passes with embotrap 28mm x 43mm retriver device with minimal recanalizatiobn of occluded RT MCA M1 seg For MRI today   LOS: 1 day   I spent a total of 15 minutes in face to face in clinical consultation, greater than 50% of which was counseling/coordinating care for post CVA intervention  Ascencion Dike PA-C 02/11/2018 11:07 AM

## 2018-02-11 NOTE — Progress Notes (Signed)
Rehab Admissions Coordinator Note:  Patient was screened by Cleatrice Burke for appropriateness for an Inpatient Acute Rehab Consult per PT recommendation.  At this time, we are recommending Inpatient Rehab consult.  Cleatrice Burke 02/11/2018, 4:41 PM  I can be reached at [phone].

## 2018-02-11 NOTE — Evaluation (Signed)
Physical Therapy Evaluation Patient Details Name: Brandy [name] MRN: [id number] DOB: [birth year] Today's Date: 02/11/2018   History of Present Illness  77 yo admitted with Left weakness and right gaze with Rt MCA CVA s/p revascularization 11/15. PMH significant for cardiomyopathy, TIA, HTN, CHF, Lt MCA stroke ( April 2019), and tobacco abuse   Clinical Impression  Pt pleasant with right gaze preference, left inattention, no noted movement of LUE or LLE, left bias in sitting, impaired transfers and function who will benefit from acute therapy to maximize mobility, balance, strength and transfers to decrease burden of care. Pt lives with spouse with strong family support and family reports they can provide 24hr assist in addition to that of spouse. Pt with decreased control of saliva in sitting and lack of awareness of deficits. Pt very motivated to follow commands and attempt mobility and will be able to transfer OOB with 2 person assist. Pt would greatly benefit from CIR.     Follow Up Recommendations CIR;Supervision/Assistance - 24 hour    Equipment Recommendations  Wheelchair (measurements PT);3in1 (PT)    Recommendations for Other Services OT consult;Speech consult;Rehab consult     Precautions / Restrictions Precautions Precautions: Fall Precaution Comments: left inattention      Mobility  Bed Mobility Overal bed mobility: Needs Assistance Bed Mobility: Rolling;Sidelying to Sit;Sit to Sidelying Rolling: Mod assist Sidelying to sit: Max assist     Sit to sidelying: Max assist General bed mobility comments: mod assist to roll to left with cues for use of right side and rail, physical assist and hooking RLE under LLE to bring legs off of bed. Max assist to elevate trunk from surface. Return to sidelying max assist to bring legs onto surface, gravity assisted trunk descent  Transfers Overall transfer level: Needs assistance   Transfers: Sit to/from Stand Sit to Stand: Max  assist         General transfer comment: with left knee blocked and over the back assist pt able to bear weight on RLE and initiate elevating sacrum from surface but unable to achieve full standing  Ambulation/Gait                Stairs            Wheelchair Mobility    Modified Rankin (Stroke Patients Only) Modified Rankin (Stroke Patients Only) Pre-Morbid Rankin Score: No symptoms Modified Rankin: Severe disability     Balance Overall balance assessment: Needs assistance Sitting-balance support: Single extremity supported Sitting balance-Leahy Scale: Zero Sitting balance - Comments: pt able to maintain midline briefly with RUE on foot rail, without UE assist pt with left lean, no awareness and max assist for posture. Worked on facilitation for trunk extension and midline EOB with propping on bil UE without activation of LUE in propping or return to sitting Postural control: Posterior lean;Left lateral lean                                   Pertinent Vitals/Pain Pain Assessment: No/denies pain    Home Living Family/patient expects to be discharged to:: Private residence Living Arrangements: Spouse/significant other;Children Available Help at Discharge: Family;Available 24 hours/day Type of Home: House Home Access: Stairs to enter Entrance Stairs-Rails: None Entrance Stairs-Number of Steps: 1 Home Layout: Two level Home Equipment: Shower seat      Prior Function Level of Independence: Independent         Comments: community ambulator,  drives, 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   Dominant Hand: Right    Extremity/Trunk Assessment   Upper Extremity Assessment Upper Extremity Assessment: LUE deficits/detail LUE Deficits / Details: no noted AROM, PROM WFL    Lower Extremity Assessment Lower Extremity Assessment: LLE deficits/detail LLE Deficits / Details: no AROM noted, PROM WFL    Cervical / Trunk  Assessment Cervical / Trunk Assessment: Other exceptions;Kyphotic Cervical / Trunk Exceptions: right gaze preference with right neck rotation, left lean in sitting  Communication   Communication: Expressive difficulties  Cognition Arousal/Alertness: Awake/alert Behavior During Therapy: Flat affect Overall Cognitive Status: Impaired/Different from baseline Area of Impairment: Safety/judgement;Problem solving                         Safety/Judgement: Decreased awareness of safety;Decreased awareness of deficits   Problem Solving: Decreased initiation;Slow processing;Requires verbal cues;Requires tactile cues General Comments: left inattention      General Comments      Exercises     Assessment/Plan    PT Assessment Patient needs continued PT services  PT Problem List Decreased strength;Decreased mobility;Decreased safety awareness;Decreased activity tolerance;Decreased coordination;Decreased cognition;Decreased balance;Decreased knowledge of use of DME;Impaired sensation       PT Treatment Interventions DME instruction;Therapeutic activities;Cognitive remediation;Therapeutic exercise;Patient/family education;Balance training;Wheelchair mobility training;Functional mobility training;Neuromuscular re-education    PT Goals (Current goals can be found in the Care Plan section)  Acute Rehab PT Goals Patient Stated Goal: return home and play piano PT Goal Formulation: With patient/family Time For Goal Achievement: 02/25/18 Potential to Achieve Goals: Fair    Frequency Min 4X/week   Barriers to discharge        Co-evaluation               AM-PAC PT "6 Clicks" Daily Activity  Outcome Measure Difficulty turning over in bed (including adjusting bedclothes, sheets and blankets)?: Unable Difficulty moving from lying on back to sitting on the side of the bed? : Unable Difficulty sitting down on and standing up from a chair with arms (e.g., wheelchair, bedside  commode, etc,.)?: Unable Help needed moving to and from a bed to chair (including a wheelchair)?: Total Help needed walking in hospital room?: Total Help needed climbing 3-5 steps with a railing? : Total 6 Click Score: 6    End of Session   Activity Tolerance: Patient tolerated treatment well Patient left: in bed;with call Yawn/phone within reach;with bed alarm set;with family/visitor present(semichair in bed) Nurse Communication: Mobility status;Need for lift equipment;Precautions PT Visit Diagnosis: Other abnormalities of gait and mobility (R26.89);Other symptoms and signs involving the nervous system (R29.898);Hemiplegia and hemiparesis Hemiplegia - Right/Left: Left Hemiplegia - dominant/non-dominant: Non-dominant Hemiplegia - caused by: Cerebral infarction    Time: 1242-1310 PT Time Calculation (min) (ACUTE ONLY): 28 min   Charges:   PT Evaluation $PT Eval Moderate Complexity: 1 Mod PT Treatments $Therapeutic Activity: 8-22 mins        Dejanee Thibeaux Pam Drown, PT Acute Rehabilitation Services Pager: [phone] Office: [phone]   Cyd Hostler B Hiren Peplinski 02/11/2018, 1:22 PM

## 2018-02-11 NOTE — Progress Notes (Signed)
Preliminary notes--Bilateral lower extremities venous duplex exam completed. Negative for DVT.  Incidental findings: A cystic structure with non-vascular feature seen at right popliteal fossa medial, measuring 2.54x3.24x0.85cm.  A cystic structure with internal echoes, non-vascular feature seen at left popliteal fossa medial, measuring 1.36x2.09x0.98cm.  Jessa Stinson H Arijana Narayan(RDMS RVT) 02/11/18 4:41 PM

## 2018-02-12 ENCOUNTER — Inpatient Hospital Stay (HOSPITAL_COMMUNITY): Payer: Medicare Other

## 2018-02-12 DIAGNOSIS — I5042 Chronic combined systolic (congestive) and diastolic (congestive) heart failure: Secondary | ICD-10-CM

## 2018-02-12 DIAGNOSIS — I340 Nonrheumatic mitral (valve) insufficiency: Secondary | ICD-10-CM

## 2018-02-12 DIAGNOSIS — I10 Essential (primary) hypertension: Secondary | ICD-10-CM

## 2018-02-12 DIAGNOSIS — I6930 Unspecified sequelae of cerebral infarction: Secondary | ICD-10-CM

## 2018-02-12 DIAGNOSIS — N183 Chronic kidney disease, stage 3 unspecified: Secondary | ICD-10-CM | POA: Diagnosis present

## 2018-02-12 DIAGNOSIS — D62 Acute posthemorrhagic anemia: Secondary | ICD-10-CM | POA: Diagnosis not present

## 2018-02-12 DIAGNOSIS — Z981 Arthrodesis status: Secondary | ICD-10-CM

## 2018-02-12 DIAGNOSIS — I63511 Cerebral infarction due to unspecified occlusion or stenosis of right middle cerebral artery: Secondary | ICD-10-CM

## 2018-02-12 LAB — BASIC METABOLIC PANEL
Anion gap: 5 (ref 5–15)
BUN: 15 mg/dL (ref 8–23)
CHLORIDE: 112 mmol/L — AB (ref 98–111)
CO2: 24 mmol/L (ref 22–32)
CREATININE: 1.05 mg/dL — AB (ref 0.44–1.00)
Calcium: 8.5 mg/dL — ABNORMAL LOW (ref 8.9–10.3)
GFR calc non Af Amer: 51 mL/min — ABNORMAL LOW (ref >60)
GFR, EST AFRICAN AMERICAN: 59 mL/min — AB (ref >60)
Glucose, Bld: 82 mg/dL (ref 70–99)
Potassium: 3.5 mmol/L (ref 3.5–5.1)
Sodium: 141 mmol/L (ref 135–145)

## 2018-02-12 LAB — CBC
HEMATOCRIT: 27.8 % — AB (ref 36.0–46.0)
HEMOGLOBIN: 8.8 g/dL — AB (ref 12.0–15.0)
MCH: 28.7 pg (ref 26.0–34.0)
MCHC: 31.7 g/dL (ref 30.0–36.0)
MCV: 90.6 fL (ref 80.0–100.0)
Platelets: 144 10*3/uL — ABNORMAL LOW (ref 150–400)
RBC: 3.07 MIL/uL — ABNORMAL LOW (ref 3.87–5.11)
RDW: 15.3 % (ref 11.5–15.5)
WBC: 7.8 10*3/uL (ref 4.0–10.5)
nRBC: 0 % (ref 0.0–0.2)

## 2018-02-12 LAB — ECHOCARDIOGRAM COMPLETE
Height: 64 in
Weight: 2416.24 oz

## 2018-02-12 MED ORDER — UMECLIDINIUM-VILANTEROL 62.5-25 MCG/INH IN AEPB
1.0000 | INHALATION_SPRAY | Freq: Every day | RESPIRATORY_TRACT | Status: DC
  Administered 2018-02-12 – 2018-02-14 (×3): 1 via RESPIRATORY_TRACT
  Filled 2018-02-12 (×2): qty 14

## 2018-02-12 MED ORDER — LABETALOL HCL 5 MG/ML IV SOLN: 10.0000 mg | INTRAVENOUS | Status: DC | PRN

## 2018-02-12 NOTE — Progress Notes (Signed)
  Echocardiogram 2D Echocardiogram has been performed.  Brandy [name] 02/12/2018, 9:36 AM

## 2018-02-12 NOTE — Evaluation (Signed)
Occupational Therapy Evaluation Patient Details Name: Brandy [name] MRN: [id number] DOB: 11-13-1940 Today's Date: 02/12/2018    History of Present Illness 77 yo admitted with Left weakness and right gaze with Rt MCA CVA.  Mechanical thrombectomy attempted but failed- minimal recanalizatiobn of occluded RT MCA M1 seg. PMH significant for cardiomyopathy, TIA, HTN, CHF, Lt MCA stroke ( April 2019), and tobacco abuse    Clinical Impression   Pt admitted with above. She demonstrates the below listed deficits and will benefit from continued OT to maximize safety and independence with BADLs.  Pt presents to OT with dense Lt hemiplegia, decreased trunk control, impaired balance, mild Lt inattention, and impaired cognition.  Currently, she requires min - total A for ADLs and max A for functional mobility.  PTA, pt lived with spouse and was fully independent with ADLs.  Family is very supportive.  Recommend CIR level rehab.       Follow Up Recommendations  CIR;Supervision/Assistance - 24 hour    Equipment Recommendations  None recommended by OT    Recommendations for Other Services Rehab consult     Precautions / Restrictions Precautions Precautions: Fall Precaution Comments: mild Lt inattention       Mobility Bed Mobility Overal bed mobility: Needs Assistance Bed Mobility: Supine to Sit;Sit to Supine;Rolling Rolling: Min assist(to roll to Lt using bedrail )   Supine to sit: Max assist Sit to supine: Mod assist   General bed mobility comments: assist to move LEs off bed and lift trunk.  Pt able to assist with lowering trunk down to bed and able to lift Rt LE onto bed.  Required assistance for Lt LE   Transfers     Transfers: Sit to/from Stand Sit to Stand: Max assist              Balance Overall balance assessment: Needs assistance Sitting-balance support: Single extremity supported;Feet supported Sitting balance-Leahy Scale: Poor Sitting balance - Comments: Pt able to  sit EOB with mod A while working on anterior/posterior pelvic tilt with facilitation                                    ADL either performed or assessed with clinical judgement   ADL Overall ADL's : Needs assistance/impaired Eating/Feeding: Minimal assistance;Sitting   Grooming: Wash/dry hands;Wash/dry face;Oral care;Minimal assistance;Bed level;Sitting   Upper Body Bathing: Maximal assistance;Bed level   Lower Body Bathing: Maximal assistance;Bed level   Upper Body Dressing : Total assistance;Sitting;Bed level   Lower Body Dressing: Total assistance;Bed level   Toilet Transfer: Maximal assistance   Toileting- Clothing Manipulation and Hygiene: Total assistance;Bed level       Functional mobility during ADLs: Maximal assistance       Vision   Additional Comments: vision to be assessed      Perception Perception Perception Tested?: Yes Perception Deficits: Inattention/neglect Inattention/Neglect: Does not attend to left side of body(mild intattention )   Praxis      Pertinent Vitals/Pain Pain Assessment: No/denies pain     Hand Dominance Right   Extremity/Trunk Assessment Upper Extremity Assessment Upper Extremity Assessment: LUE deficits/detail LUE Deficits / Details: Lt UE flaccid.  PROM WFL.  Brunnstrom stage I UE and hand    Lower Extremity Assessment Lower Extremity Assessment: Defer to PT evaluation   Cervical / Trunk Assessment Cervical / Trunk Assessment: Other exceptions;Kyphotic Cervical / Trunk Exceptions: Pt maintains posterior pelvic tilt with passive lateral  flexion to the Lt.  She is able to initiate trunk activation Lt side with mod facilitation to move into anterior pelvic tilt with thoracic extension    Communication Communication Communication: Expressive difficulties(Pt reports she had residual speech deficit from old CVA )   Cognition Arousal/Alertness: Awake/alert Behavior During Therapy: Flat affect Overall Cognitive  Status: Impaired/Different from baseline Area of Impairment: Attention;Awareness;Problem solving                   Current Attention Level: Selective     Safety/Judgement: Decreased awareness of deficits Awareness: Intellectual Problem Solving: Decreased initiation;Difficulty sequencing;Requires verbal cues     General Comments  VSS.  Family present     Exercises     Shoulder Instructions      Home Living Family/patient expects to be discharged to:: Private residence Living Arrangements: Spouse/significant other;Children Available Help at Discharge: Family;Available 24 hours/day Type of Home: House Home Access: Stairs to enter [employer] of Steps: 1 Entrance Stairs-Rails: None Home Layout: Two level Alternate Level Stairs-Number of Steps: 12 steps to basement Alternate Level Stairs-Rails: Right;Left;Can reach both Bathroom Shower/Tub: Occupational psychologist: Standard Bathroom Accessibility: Yes   Home Equipment: Shower seat          Prior Functioning/Environment Level of Independence: Independent        Comments: community ambulator, drives, 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 Problem List: Decreased strength;Decreased range of motion;Decreased activity tolerance;Impaired balance (sitting and/or standing);Impaired vision/perception;Decreased coordination;Decreased cognition;Decreased safety awareness;Impaired sensation;Impaired UE functional use;Impaired tone      OT Treatment/Interventions: Self-care/ADL training;Neuromuscular education;DME and/or AE instruction;Therapeutic activities;Cognitive remediation/compensation;Visual/perceptual remediation/compensation;Patient/family education;Balance training;Splinting    OT Goals(Current goals can be found in the care plan section) Acute Rehab OT Goals Patient Stated Goal: to get Lt UE working  OT Goal Formulation: With patient/family Time For Goal Achievement:  02/26/18 Potential to Achieve Goals: Good ADL Goals Pt Will Perform Grooming: sitting;with supervision;with set-up Pt Will Perform Upper Body Bathing: with mod assist;sitting Pt Will Perform Lower Body Bathing: with mod assist;sit to/from stand Pt Will Transfer to Toilet: with mod assist;stand pivot transfer;bedside commode Additional ADL Goal #1: Pt will use Lt UE as a stablizer with min A during ADLs Additional ADL Goal #2: Pt will locate grooming items on bedside table with min cues  OT Frequency: Min 2X/week   Barriers to D/C:            Co-evaluation              AM-PAC PT "6 Clicks" Daily Activity     Outcome Measure Help from another person eating meals?: A Little Help from another person taking care of personal grooming?: A Little Help from another person toileting, which includes using toliet, bedpan, or urinal?: A Lot Help from another person bathing (including washing, rinsing, drying)?: A Lot Help from another person to put on and taking off regular upper body clothing?: A Lot Help from another person to put on and taking off regular lower body clothing?: Total 6 Click Score: 13   End of Session Nurse Communication: Mobility status  Activity Tolerance: Patient tolerated treatment well Patient left: in bed;with call [place]ri[place]/phone within reach;with family/visitor present  OT Visit Diagnosis: Hemiplegia and hemiparesis Hemiplegia - Right/Left: Left Hemiplegia - dominant/non-dominant: Non-Dominant Hemiplegia - caused by: Cerebral infarction                Time: 5732-2025 OT Time Calculation (min): 42  min Charges:  OT General Charges $OT Visit: 1 Visit OT Evaluation $OT Eval Moderate Complexity: 1 Mod OT Treatments $Neuromuscular Re-education: 23-37 mins  Lucille Passy, OTR/L Acute Rehabilitation Services Pager [phone] Office [phone]   Lucille Passy M 02/12/2018, 5:41 PM

## 2018-02-12 NOTE — Consult Note (Signed)
Physical Medicine and Rehabilitation Consult Reason for Consult: Stroke Referring Physician: Rosalin Hawking, MD   HPI: Brandy [name] is a 77 y.o. female with pmh of CVA, HTN, CHF, CAD, cervical fusion presented with right MCA infarct.  History taken from chart review and brother.  Husband noted left sided weakness and drooling.  Patient present to OSH with left sided weakness and right gaze.  She was transferred to [facility] and underwent unsuccessful thrombectomy. MRI reviewed, showing right MCA infarct.  Per report, MRI/MRA right basal ganglia infarct, right M1 occlusion with punctate cortical infarcts.  TEE and loop once stable.   ROS  Negative per pt, but unreliable  Past Medical History:  Diagnosis Date  . Cardiomyopathy    a. EF of 25% in 07/2006 b. EF normalized by repeat echo in 2011 c. EF 35-40% by echo in 05/2017 with cath showing mild nonobstructive CAD  . Chronic combined systolic and diastolic CHF (congestive heart failure) ([place])   . Colon polyps   . Diverticulosis of colon   . Hypertension   . Hypertensive heart disease   . Osteopenia   . TIA (transient ischemic attack)   . Tobacco abuse    50 pack year   Past Surgical History:  Procedure Laterality Date  . BREAST BIOPSY    . CARPAL TUNNEL RELEASE     right hand  . CERVICAL FUSION    . CESAREAN SECTION     2 times  . COLONOSCOPY  [id number]   pt uncertain as to whether polypectomy required or performed  . ECTOPIC PREGNANCY SURGERY    . RADIOLOGY WITH ANESTHESIA N/A 02/10/2018   Procedure: RADIOLOGY WITH ANESTHESIA;  Surgeon: Luanne Bras, MD;  Location: [facility];  Service: Radiology;  Laterality: N/A;  . RIGHT/LEFT HEART CATH AND CORONARY ANGIOGRAPHY N/A 06/03/2017   Procedure: RIGHT/LEFT HEART CATH AND CORONARY ANGIOGRAPHY;  Surgeon: Jettie Booze, MD;  Location: [facility];  Service: Cardiovascular;  Laterality: N/A;  . TONSILLECTOMY     Family History  Problem Relation Age of Onset  .  Hypertension Brother   . Hypertension Maternal Grandmother   . Cancer Paternal Grandmother    Social History:  reports that she quit smoking about 9 years ago. Her smoking use included cigarettes. She started smoking about 57 years ago. She has a 50.00 pack-year smoking history. She has never used smokeless tobacco. She reports that she does not drink alcohol or use drugs. Allergies:  Allergies  Allergen Reactions  . Lisinopril Swelling   Medications Prior to Admission  Medication Sig Dispense Refill  . aspirin EC 81 MG EC tablet Take 2 tablets (162 mg total) by mouth daily. 30 tablet 1  . atorvastatin (LIPITOR) 10 MG tablet TAKE 1 TABLET BY MOUTH EVERY DAY AT 6 PM 30 tablet 6  . BIDIL 20-37.5 MG tablet TAKE 1 TABLET BY MOUTH THREE TIMES DAILY 90 tablet 6  . carvedilol (COREG) 12.5 MG tablet Take 1 tablet (12.5 mg total) by mouth 2 (two) times daily with a meal. 60 tablet 6  . cetirizine (ZYRTEC) 10 MG tablet Take 10 mg by mouth daily as needed for allergies.     Marland Kitchen donepezil (ARICEPT) 5 MG tablet Take 1 tablet by mouth at bedtime.  1  . furosemide (LASIX) 20 MG tablet Take 1 tablet (20 mg total) by mouth daily as needed for edema. 90 tablet 3  . predniSONE (DELTASONE) 5 MG tablet Take 5 mg by mouth  daily as needed (for swelling and right knee pain).    Marland Kitchen umeclidinium-vilanterol (ANORO ELLIPTA) 62.5-25 MCG/INH AEPB Inhale 1 puff into the lungs daily. 60 each 5    Home: Home Living Family/patient expects to be discharged to:: Private residence Living Arrangements: Spouse/significant other, Children Available Help at Discharge: Family, Available 24 hours/day Type of Home: House Home Access: Stairs to enter [employer] of Steps: 1 Entrance Stairs-Rails: None Home Layout: Two level Alternate Level Stairs-Number of Steps: 12 steps to basement Alternate Level Stairs-Rails: Right, Left, Can reach both Bathroom Shower/Tub: Multimedia programmer: Standard Bathroom  Accessibility: Yes Home Equipment: Careers adviser History: Prior Function Level of Independence: Independent Comments: community ambulator, drives, 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  Pt able to assist with lowering trunk down to bed and able to lift Rt LE onto bed.  Required assistance for Lt LE  Transfers Overall transfer level: Needs assistance Transfers: Sit to/from Stand Sit to Stand: Max assist General transfer comment: with left knee blocked and over the back assist pt able to bear weight on RLE and initiate elevating sacrum from surface but unable to achieve full standing      ADL: ADL Overall ADL's : Needs assistance/impaired Eating/Feeding: Minimal assistance, Sitting Grooming: Wash/dry hands, Wash/dry face, Oral care, Minimal assistance, Bed level, Sitting Upper Body Bathing: Maximal assistance, Bed level Lower Body Bathing: Maximal assistance, Bed level Upper Body Dressing : Total assistance, Sitting, Bed level Lower Body Dressing: Total assistance, Bed level Toilet Transfer: Maximal assistance Toileting- Clothing Manipulation and Hygiene: Total assistance, Bed level Functional mobility during ADLs: Maximal assistance  Cognition: Cognition Overall Cognitive Status: Impaired/Different from baseline Orientation Level: Oriented X4 Cognition Arousal/Alertness: Awake/alert Behavior During Therapy: Flat affect Overall Cognitive Status: Impaired/Different from baseline Area of Impairment: Attention, Awareness, Problem solving Current Attention Level: Selective Safety/Judgement: Decreased awareness  of deficits Awareness: Intellectual Problem Solving: Decreased initiation, Difficulty sequencing, Requires verbal cues General Comments: left inattention  Blood pressure 127/70, pulse 65, temperature 98.5 F (36.9 C), temperature source Oral, resp. rate 15, height 5\' 4"  (1.626 m), weight 68.5 kg, SpO2 99 %. Physical Exam  Constitutional: She appears well-developed.  Obese  HENT:  Head: Normocephalic and atraumatic.  Eyes: EOM are normal.  Right eye ptosis  Neck: Normal range of motion. Neck supple.  Cardiovascular: Normal rate and regular rhythm.  Respiratory: Effort normal and breath sounds normal.  GI: Soft. Bowel sounds are normal.  Musculoskeletal:  No edema or tenderness in extremities  Neurological: She is alert.  Motor: RUE: 5/5 proximal to distal RLE: 4/5 proximal to distal LUE: 0/5 proximal to distall LLE: HF 1/5, distally 0/5 Sensation intact to light touch  Skin: Skin is warm and dry.  Psychiatric: Her speech is slurred. She is slowed. She exhibits abnormal remote memory.    Results for orders placed or performed during the hospital encounter of 02/10/18 (from the past 24 hour(s))  CBC     Status: Abnormal   Collection Time: 02/12/18  3:38 PM  Result Value Ref Range   WBC 7.8 4.0 - 10.5 K/uL   RBC 3.07 (L) 3.87 -  5.11 MIL/uL   Hemoglobin 8.8 (L) 12.0 - 15.0 g/dL   HCT 27.8 (L) 36.0 - 46.0 %   MCV 90.6 80.0 - 100.0 fL   MCH 28.7 26.0 - 34.0 pg   MCHC 31.7 30.0 - 36.0 g/dL   RDW 15.3 11.5 - 15.5 %   Platelets 144 (L) 150 - 400 K/uL   nRBC 0.0 0.0 - 0.2 %  Basic metabolic panel     Status: Abnormal   Collection Time: 02/12/18  3:38 PM  Result Value Ref Range   Sodium 141 135 - 145 mmol/L   Potassium 3.5 3.5 - 5.1 mmol/L   Chloride 112 (H) 98 - 111 mmol/L   CO2 24 22 - 32 mmol/L   Glucose, Bld 82 70 - 99 mg/dL   BUN 15 8 - 23 mg/dL   Creatinine, Ser 1.05 (H) 0.44 - 1.00 mg/dL   Calcium 8.5 (L) 8.9 - 10.3 mg/dL   GFR calc non Af Amer 51 (L) >60 mL/min    GFR calc Af Amer 59 (L) >60 mL/min   Anion gap 5 5 - 15   Mr Brain Wo Contrast  Result Date: 02/12/2018 CLINICAL DATA:  Stroke follow-up.  Right M1 occlusion. EXAM: MRI HEAD WITHOUT CONTRAST MRA HEAD WITHOUT CONTRAST TECHNIQUE: Multiplanar, multiecho pulse sequences of the brain and surrounding structures were obtained without intravenous contrast. Angiographic images of the head were obtained using MRA technique without contrast. COMPARISON:  Head CT/CTA 02/10/2018 FINDINGS: MRI HEAD FINDINGS BRAIN: There is multifocal diffusion restriction throughout the right MCA territory, predominantly within the right basal ganglia and insula. There is mild mass effect on the frontal horn of the right lateral ventricle. The midline structures are normal. There is cytotoxic edema within the right MCA distribution according to the areas ischemia. There is petechial hemorrhage within the right basal ganglia. The CSF spaces are normal for age, with no hydrocephalus. Susceptibility-sensitive sequences show no chronic microhemorrhage or superficial siderosis. SKULL AND UPPER CERVICAL SPINE: The visualized skull base, calvarium, upper cervical spine and extracranial soft tissues are normal. SINUSES/ORBITS: No fluid levels or advanced mucosal thickening. No mastoid or middle ear effusion. The orbits are normal. MRA HEAD FINDINGS Intracranial internal carotid arteries: Normal. Anterior cerebral arteries: Normal. Middle cerebral arteries: The M1 segment of the right middle cerebral artery remains occluded. There is return of flow related enhancement within the proximal M2 branches. Left MCA is normal. Posterior communicating arteries: Not visualized Posterior cerebral arteries: Normal. Basilar artery: Normal. Vertebral arteries: Codominant.  Normal. Superior cerebellar arteries: Normal. Inferior cerebellar arteries: Normal. IMPRESSION: 1. Persistent occlusion of the right middle cerebral artery M1 segment. There is flow related  enhancement within the right M2 branches. 2. Large area infarction within the right middle cerebral artery territory, predominantly involving the anterior insula and the basal ganglia. 3. Petechial hemorrhage within the right basal ganglia. Heidelberg classification 1b: HI2, confluent petechiae, no mass effect. Electronically Signed   By: Ulyses Jarred M.D.   On: 02/12/2018 00:25   Mr Jodene Nam Head Wo Contrast  Result Date: 02/12/2018 CLINICAL DATA:  Stroke follow-up.  Right M1 occlusion. EXAM: MRI HEAD WITHOUT CONTRAST MRA HEAD WITHOUT CONTRAST TECHNIQUE: Multiplanar, multiecho pulse sequences of the brain and surrounding structures were obtained without intravenous contrast. Angiographic images of the head were obtained using MRA technique without contrast. COMPARISON:  Head CT/CTA 02/10/2018 FINDINGS: MRI HEAD FINDINGS BRAIN: There is multifocal diffusion restriction throughout the right MCA territory, predominantly within the right basal ganglia and insula. There  is mild mass effect on the frontal horn of the right lateral ventricle. The midline structures are normal. There is cytotoxic edema within the right MCA distribution according to the areas ischemia. There is petechial hemorrhage within the right basal ganglia. The CSF spaces are normal for age, with no hydrocephalus. Susceptibility-sensitive sequences show no chronic microhemorrhage or superficial siderosis. SKULL AND UPPER CERVICAL SPINE: The visualized skull base, calvarium, upper cervical spine and extracranial soft tissues are normal. SINUSES/ORBITS: No fluid levels or advanced mucosal thickening. No mastoid or middle ear effusion. The orbits are normal. MRA HEAD FINDINGS Intracranial internal carotid arteries: Normal. Anterior cerebral arteries: Normal. Middle cerebral arteries: The M1 segment of the right middle cerebral artery remains occluded. There is return of flow related enhancement within the proximal M2 branches. Left MCA is normal.  Posterior communicating arteries: Not visualized Posterior cerebral arteries: Normal. Basilar artery: Normal. Vertebral arteries: Codominant.  Normal. Superior cerebellar arteries: Normal. Inferior cerebellar arteries: Normal. IMPRESSION: 1. Persistent occlusion of the right middle cerebral artery M1 segment. There is flow related enhancement within the right M2 branches. 2. Large area infarction within the right middle cerebral artery territory, predominantly involving the anterior insula and the basal ganglia. 3. Petechial hemorrhage within the right basal ganglia. Heidelberg classification 1b: HI2, confluent petechiae, no mass effect. Electronically Signed   By: Ulyses Jarred M.D.   On: 02/12/2018 00:25   Vas [place] Lower Extremity Venous (dvt)  Result Date: 02/12/2018  Lower Venous Study Indications: Embolic stroke.  Limitations: Body habitus, bandages and immobility. Performing Technologist: Lorina Rabon  Examination Guidelines: A complete evaluation includes B-mode imaging, spectral Doppler, color Doppler, and power Doppler as needed of all accessible portions of each vessel. Bilateral testing is considered an integral part of a complete examination. Limited examinations for reoccurring indications may be performed as noted.  Right Venous Findings: +---------+---------------+---------+-----------+----------+-------+          CompressibilityPhasicitySpontaneityPropertiesSummary +---------+---------------+---------+-----------+----------+-------+ CFV      Full           Yes      Yes                          +---------+---------------+---------+-----------+----------+-------+ SFJ      Full                                                 +---------+---------------+---------+-----------+----------+-------+ FV Prox  Full                                                 +---------+---------------+---------+-----------+----------+-------+ FV Mid   Full                                                  +---------+---------------+---------+-----------+----------+-------+ FV DistalFull                                                 +---------+---------------+---------+-----------+----------+-------+ PFV  Full                                                 +---------+---------------+---------+-----------+----------+-------+ POP      Full           Yes      Yes                          +---------+---------------+---------+-----------+----------+-------+ PTV      Full                                                 +---------+---------------+---------+-----------+----------+-------+ PERO     Full                                                 +---------+---------------+---------+-----------+----------+-------+ A cystic structure with non-vascular feature seen at right popliteal fossa medial, measuring 2.54x3.24x0.85cm.  Left Venous Findings: +---------+---------------+---------+-----------+----------+-------+          CompressibilityPhasicitySpontaneityPropertiesSummary +---------+---------------+---------+-----------+----------+-------+ CFV      Full           Yes      Yes                          +---------+---------------+---------+-----------+----------+-------+ SFJ      Full                                                 +---------+---------------+---------+-----------+----------+-------+ FV Prox  Full                                                 +---------+---------------+---------+-----------+----------+-------+ FV Mid   Full                                                 +---------+---------------+---------+-----------+----------+-------+ FV DistalFull                                                 +---------+---------------+---------+-----------+----------+-------+ PFV      Full                                                 +---------+---------------+---------+-----------+----------+-------+ POP       Full           Yes      Yes                          +---------+---------------+---------+-----------+----------+-------+  PTV      Full                                                 +---------+---------------+---------+-----------+----------+-------+ PERO     Full                                                 +---------+---------------+---------+-----------+----------+-------+ A cystic structure with internal echoes, non-vascular feature seen at left popliteal fossa medial, measuring 1.36x2.09x0.98cm.    Summary: Right: There is no evidence of deep vein thrombosis in the lower extremity. Left: There is no evidence of deep vein thrombosis in the lower extremity.  *See table(s) above for measurements and observations. Electronically signed by Ruta Hinds MD on 02/12/2018 at 9:14:26 AM.    Final     Assessment/Plan: Diagnosis: Right MCA infarct with history of left MCA infarct. Labs and images (see above) independently reviewed.  Records reviewed and summated above.  1. Does the need for close, 24 hr/day medical supervision in concert with the patient's rehab needs make it unreasonable for this patient to be served in a less intensive setting? Yes 2. Co-Morbidities requiring supervision/potential complications: hx of CVA, HTN (monitor and provide prns in accordance with increased physical exertion and pain), CHF (Monitor in accordance with increased physical activity and avoid UE resistance excercises), CAD, cervical fusion, CKD (avoid nephrotoxic meds), ABLA (repeat labs, transfuse to ensure appropriate perfusion for increased activity tolerance) 3. Due to bladder management, bowel management, safety, skin/wound care, disease management, medication administration and patient education, does the patient require 24 hr/day rehab nursing? Yes 4. Does the patient require coordinated care of a physician, rehab nurse, PT (1-2 hrs/day, 5 days/week), OT (1-2 hrs/day, 5 days/week) and  SLP (1-2 hrs/day, 5 days/week) to address physical and functional deficits in the context of the above medical diagnosis(es)? Yes Addressing deficits in the following areas: balance, endurance, locomotion, strength, transferring, bowel/bladder control, bathing, dressing, feeding, grooming, toileting, cognition, speech, language, swallowing and psychosocial support 5. Can the patient actively participate in an intensive therapy program of at least 3 hrs of therapy per day at least 5 days per week? Potentially 6. The potential for patient to make measurable gains while on inpatient rehab is excellent 7. Anticipated functional outcomes upon discharge from inpatient rehab are min assist and mod assist  with PT, min assist and mod assist with OT, supervision and min assist with SLP. 8. Estimated rehab length of stay to reach the above functional goals is: 24-28 days. 9. Anticipated D/C setting: Other 10. Anticipated post D/C treatments: SNF 11. Overall Rehab/Functional Prognosis: good and fair  RECOMMENDATIONS: This patient's condition is appropriate for continued rehabilitative care in the following setting: CIR if adequate care giver support. Patient has agreed to participate in recommended program. Potentially Note that insurance prior authorization may be required for reimbursement for recommended care.  Comment: Rehab Admissions Coordinator to follow up.   Delice Lesch, MD, ABPMR 02/12/2018

## 2018-02-12 NOTE — Progress Notes (Addendum)
    [facility] has been requested to perform a transesophageal echocardiogram on 02/12/2018 for stroke.  After careful review of history and examination, the risks and benefits of transesophageal echocardiogram have been explained including risks of esophageal damage, perforation (1:10,000 risk), bleeding, pharyngeal hematoma as well as other potential complications associated with conscious sedation including aspiration, arrhythmia, respiratory failure and death. Alternatives to treatment were discussed, questions were answered. Patient is unable to comprehend, I explained to her husband and brother (who is a cardiothoracic surgeon(?) from [place] )  77 yo female with PMH of combined systolic and diastolic HF, mild CAD, NICM, HTN, HLD and prior CVA presented with R MCA infarction. Vitals stable. Anemic, hgb stable. Platelet borderline low.   MD to review her telemetry prior to procedure, sinus rhythm with very frequent PACs, occasional very irregular rhythm unclear if afib.   Almyra Deforest, [place] 02/12/2018 6:36 PM

## 2018-02-12 NOTE — Progress Notes (Addendum)
STROKE TEAM PROGRESS NOTE   SUBJECTIVE (INTERVAL HISTORY) Her brother is at the bedside.  Patient is lethargic, but easily arousable and fully orientated.  BP stable.  On diet, ate 70% of breakfast.  MRI showed right BG, CR and MCA cortical infarcts. MRA showed right M1 occlusion but left M2 is open now.     OBJECTIVE Temp:  [97.9 F (36.6 C)-98.4 F (36.9 C)] 98 F (36.7 C) (11/17 0400) Pulse Rate:  [69-109] 94 (11/17 0600) Cardiac Rhythm: Normal sinus rhythm (11/17 0600) Resp:  [15-29] 20 (11/17 0600) BP: (115-151)/(57-84) 135/82 (11/17 0600) SpO2:  [94 %-99 %] 94 % (11/17 0600) Arterial Line BP: (147)/(51) 147/51 (11/16 0900)  Recent Labs  Lab 02/10/18 1041  GLUCAP 75   Recent Labs  Lab 02/10/18 1036 02/10/18 1044 02/11/18 0447  NA 144 146* 142  K 3.7 3.7 3.8  CL 110 106 113*  CO2 27  --  22  GLUCOSE 89 88 99  BUN 13 12 12   CREATININE 1.05* 1.10* 1.31*  CALCIUM 8.8*  --  8.4*   Recent Labs  Lab 02/10/18 1036  AST 18  ALT 14  ALKPHOS 54  BILITOT 0.8  PROT 5.9*  ALBUMIN 3.5   Recent Labs  Lab 02/10/18 1036 02/10/18 1044 02/11/18 0447  WBC 4.8  --  7.6  NEUTROABS 2.8  --  6.8  HGB 11.3* 11.6* 9.2*  HCT 36.9 34.0* 30.5*  MCV 91.6  --  91.3  PLT 183  --  159   No results for input(s): CKTOTAL, CKMB, CKMBINDEX, TROPONINI in the last 168 hours. Recent Labs    02/10/18 1036  LABPROT 14.7  INR 1.17   Recent Labs    02/10/18 2039  COLORURINE YELLOW  LABSPEC >1.046*  PHURINE 5.0  GLUCOSEU NEGATIVE  HGBUR NEGATIVE  BILIRUBINUR NEGATIVE  KETONESUR 20*  PROTEINUR NEGATIVE  NITRITE NEGATIVE  LEUKOCYTESUR NEGATIVE       Component Value Date/Time   CHOL 93 02/11/2018 0447   TRIG 42 02/11/2018 0447   HDL 33 (L) 02/11/2018 0447   CHOLHDL 2.8 02/11/2018 0447   VLDL 8 02/11/2018 0447   LDLCALC 52 02/11/2018 0447   Lab Results  Component Value Date   HGBA1C 4.7 (L) 02/11/2018      Component Value Date/Time   LABOPIA NONE DETECTED 02/10/2018  2000   COCAINSCRNUR NONE DETECTED 02/10/2018 2000   LABBENZ NONE DETECTED 02/10/2018 2000   AMPHETMU NONE DETECTED 02/10/2018 2000   THCU NONE DETECTED 02/10/2018 2000   LABBARB NONE DETECTED 02/10/2018 2000    Recent Labs  Lab 02/10/18 1036  ETH <10    IMAGING  I have personally reviewed the radiological images below and agree with the radiology interpretations.  Ct Angio Head W Or Wo Contrast Ct Angio Neck W Or Wo Contrast Ct Cerebral Perfusion W Contrast 02/10/2018 IMPRESSION:  Acute RIGHT M1 MCA occlusion.  Fair to good collateralization. No extracranial stenosis/dissection or carotid siphon stenosis. Significant mismatch volume of 93 mL, encompassing much of the RIGHT MCA territory.   Ct Head Code Stroke Wo Contrast 02/10/2018 IMPRESSION:  1. Mixed pattern of both acute and suspected subacute RIGHT hemisphere ischemia, in the setting of hyperdense RIGHT M1 MCA and acute LEFT-sided weakness, consistent with emergent large vessel occlusion of the RIGHT proximal MCA. No hemorrhage.  2. ASPECTS is 7.    Cerebral Angiogram - IR - Dr Estanislado Pandy 02/10/2018 S/P RT common carotid arteriogram,followed by x passes with embotrap 53mm x 82mm retriver device  with minimal recanalizatiobn of occluded RT MCA M1 seg.  Focal  Neovascularity at site of occlusion ?moya moya like phenomenon due to progressive severe underlying ASVD stenosis.   Transthoracic Echocardiogram  Study Conclusions  - Left ventricle: The cavity size was moderately dilated. Wall   thickness was increased in a pattern of moderate LVH. The   estimated ejection fraction was 40%. Diffuse hypokinesis. Doppler   parameters are consistent with both elevated ventricular   end-diastolic filling pressure and elevated left atrial filling   pressure. - Mitral valve: There was mild regurgitation. - Left atrium: The atrium was moderately dilated. - Atrial septum: No defect or patent foramen ovale was identified. - Pulmonary  arteries: PA peak pressure: 41 mm Hg (S). - Pericardium, extracardiac: A trivial pericardial effusion was   identified. Impressions: - No cardiac source of emboli was indentified.   Mr Brain Wo Contrast  Result Date: 02/12/2018 CLINICAL DATA:  Stroke follow-up.  Right M1 occlusion. EXAM: MRI HEAD WITHOUT CONTRAST MRA HEAD WITHOUT CONTRAST TECHNIQUE: Multiplanar, multiecho pulse sequences of the brain and surrounding structures were obtained without intravenous contrast. Angiographic images of the head were obtained using MRA technique without contrast. COMPARISON:  Head CT/CTA 02/10/2018 FINDINGS: MRI HEAD FINDINGS BRAIN: There is multifocal diffusion restriction throughout the right MCA territory, predominantly within the right basal ganglia and insula. There is mild mass effect on the frontal horn of the right lateral ventricle. The midline structures are normal. There is cytotoxic edema within the right MCA distribution according to the areas ischemia. There is petechial hemorrhage within the right basal ganglia. The CSF spaces are normal for age, with no hydrocephalus. Susceptibility-sensitive sequences show no chronic microhemorrhage or superficial siderosis. SKULL AND UPPER CERVICAL SPINE: The visualized skull base, calvarium, upper cervical spine and extracranial soft tissues are normal. SINUSES/ORBITS: No fluid levels or advanced mucosal thickening. No mastoid or middle ear effusion. The orbits are normal. MRA HEAD FINDINGS Intracranial internal carotid arteries: Normal. Anterior cerebral arteries: Normal. Middle cerebral arteries: The M1 segment of the right middle cerebral artery remains occluded. There is return of flow related enhancement within the proximal M2 branches. Left MCA is normal. Posterior communicating arteries: Not visualized Posterior cerebral arteries: Normal. Basilar artery: Normal. Vertebral arteries: Codominant.  Normal. Superior cerebellar arteries: Normal. Inferior  cerebellar arteries: Normal. IMPRESSION: 1. Persistent occlusion of the right middle cerebral artery M1 segment. There is flow related enhancement within the right M2 branches. 2. Large area infarction within the right middle cerebral artery territory, predominantly involving the anterior insula and the basal ganglia. 3. Petechial hemorrhage within the right basal ganglia. Heidelberg classification 1b: HI2, confluent petechiae, no mass effect. Electronically Signed   By: Ulyses Jarred M.D.   On: 02/12/2018 00:25   Mr Jodene Nam Head Wo Contrast  Result Date: 02/12/2018 CLINICAL DATA:  Stroke follow-up.  Right M1 occlusion. EXAM: MRI HEAD WITHOUT CONTRAST MRA HEAD WITHOUT CONTRAST TECHNIQUE: Multiplanar, multiecho pulse sequences of the brain and surrounding structures were obtained without intravenous contrast. Angiographic images of the head were obtained using MRA technique without contrast. COMPARISON:  Head CT/CTA 02/10/2018 FINDINGS: MRI HEAD FINDINGS BRAIN: There is multifocal diffusion restriction throughout the right MCA territory, predominantly within the right basal ganglia and insula. There is mild mass effect on the frontal horn of the right lateral ventricle. The midline structures are normal. There is cytotoxic edema within the right MCA distribution according to the areas ischemia. There is petechial hemorrhage within the right basal ganglia. The CSF spaces  are normal for age, with no hydrocephalus. Susceptibility-sensitive sequences show no chronic microhemorrhage or superficial siderosis. SKULL AND UPPER CERVICAL SPINE: The visualized skull base, calvarium, upper cervical spine and extracranial soft tissues are normal. SINUSES/ORBITS: No fluid levels or advanced mucosal thickening. No mastoid or middle ear effusion. The orbits are normal. MRA HEAD FINDINGS Intracranial internal carotid arteries: Normal. Anterior cerebral arteries: Normal. Middle cerebral arteries: The M1 segment of the right middle  cerebral artery remains occluded. There is return of flow related enhancement within the proximal M2 branches. Left MCA is normal. Posterior communicating arteries: Not visualized Posterior cerebral arteries: Normal. Basilar artery: Normal. Vertebral arteries: Codominant.  Normal. Superior cerebellar arteries: Normal. Inferior cerebellar arteries: Normal. IMPRESSION: 1. Persistent occlusion of the right middle cerebral artery M1 segment. There is flow related enhancement within the right M2 branches. 2. Large area infarction within the right middle cerebral artery territory, predominantly involving the anterior insula and the basal ganglia. 3. Petechial hemorrhage within the right basal ganglia. Heidelberg classification 1b: HI2, confluent petechiae, no mass effect. Electronically Signed   By: Ulyses Jarred M.D.   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  Ophthalmologic - fundi not visualized due to noncooperation.  Cardiovascular - Regular rate and rhythm.  Neuro - awake, alert, but lethargic, eyes open, right gaze preference but able to cross midline, nystagmus with left gaze. Orientated to place, people and month but not the year. Paucity of speech, able to name 2/2 and repeat but moderate dysarthria. PERRL, full field full but with left simultagnosia. Left facial droop and tongue protrusion to the left. Left UE 0/5 and left LE trace withdraw with pain. RUE and RLE 4+/5. Left babinski positive. Sensation symmetrical, coordination not cooperative. Gait not tested.    ASSESSMENT/PLAN Ms. LISE PINCUS is a 77 y.o. female with history of tobacco use, previous TIA, hypertension, cardiomyopathy with an EF of 25%, and congestive heart failure, admitted to [facility] with left-sided weakness and right gaze preference. No tPA given due to late presentation. Mechanical thrombectomy attempted but failed- minimal recanalizatiobn of occluded RT MCA M1 seg.   Stroke: R MCA infarct with right M1 occlusion failed IR, likely due to cardioembolic source  Resultant right gaze and the left hemiplegia  MRI - large right MCA infarcts including BG, CR and punctate cortical infarcts  MRA  - right M1 occlusion with some flow in right M2 branches  Head CT - right MCA infarcts with right M1 hyperdense signal  CTA H&N - Acute RIGHT M1 MCA occlusion, but previous left M2 occlusion resolved  CTP - Significant mismatch volume of 93 mL, encompassing much of the RIGHT MCA territory.   IR attempted but failed to recanalize right M1  2D Echo - EF 40%  Recommend TEE and loop recorder once more stable for cardioembolic work up given hx of CHF with now low EF and embolic pattern of stroke  LDL - 52  HgbA1c - 4.7  UDS - negative  Lovenox for VTE prophylaxis  aspirin 81 mg daily prior to admission, now  on aspirin 325 mg daily and clopidogrel 75 mg daily.   Patient counseled to be compliant with her antithrombotic medications  Ongoing aggressive stroke risk factor management  Therapy recommendations: pending  Disposition: pending  History of stroke  06/2017 admitted in [place], left MCA infarct with left M2 occlusion and right M1 distal high-grade stenosis, EF 40%.  Put on aspirin 162 and a statin on discharge  This admission MRA showed right M1 occlusion but left M2 is patent now - indicating the embolic pattern  Cardiomyopathy  EF 35 to 40% in 05/2017  EF 40% in 06/2017  TTE EF 40% this admission  Has been followed with cardiology and on bidil and coreg PTA  Not resumed bidil and  coreg yet due to soft BP not on BP meds.  Hypertension . Stable  Long term BP goal 130-150 given large vessel occlusion  Hyperlipidemia  Home meds:  Lipitor 10 mg daily  LDL 52, goal < 70  Resume Lipitor  Continue statin at discharge  Other Stroke Risk Factors  Advanced age  Former cigarette smoker - quit  CAD  Other Active Problems  Post procedure anemia hemoglobin 11.6-9.2-> pending  Elevated Creatinine -1.10-1.31-> pending  COPD  Hospital day # 2  This patient is critically ill due to large right MCA stroke, history of stroke, right M1 occlusion, cardiomyopathy, CHF and at significant risk of neurological worsening, death form recurrent stroke, hemorrhagic conversion, seizure, heart failure. This patient's care requires constant monitoring of vital signs, hemodynamics, respiratory and cardiac monitoring, review of multiple databases, neurological assessment, discussion with family, other specialists and medical decision making of high complexity. I spent 40 minutes of neurocritical care time in the care of this patient. I had long discussion with patient and her brother at bedside, updated pt current condition, treatment plan and potential prognosis. They expressed understanding and appreciation.   Rosalin Hawking, MD PhD Stroke Neurology 02/12/2018 11:23 AM      To contact Stroke Continuity provider, please refer to [email or web address] After hours, contact General Neurology

## 2018-02-13 ENCOUNTER — Encounter (HOSPITAL_COMMUNITY)
Admission: EM | Disposition: A | Discharge status: Rehabilitation Facility | Payer: Self-pay | Source: Home / Self Care | Attending: Neurology

## 2018-02-13 ENCOUNTER — Other Ambulatory Visit: Payer: Self-pay | Admitting: Nurse Practitioner

## 2018-02-13 ENCOUNTER — Inpatient Hospital Stay (HOSPITAL_COMMUNITY): Payer: Medicare Other

## 2018-02-13 DIAGNOSIS — I6389 Other cerebral infarction: Secondary | ICD-10-CM

## 2018-02-13 DIAGNOSIS — I340 Nonrheumatic mitral (valve) insufficiency: Secondary | ICD-10-CM

## 2018-02-13 DIAGNOSIS — I639 Cerebral infarction, unspecified: Secondary | ICD-10-CM

## 2018-02-13 DIAGNOSIS — R509 Fever, unspecified: Secondary | ICD-10-CM

## 2018-02-13 HISTORY — PX: TEE WITHOUT CARDIOVERSION: SHX5443

## 2018-02-13 LAB — BASIC METABOLIC PANEL
Anion gap: 6 (ref 5–15)
BUN: 13 mg/dL (ref 8–23)
CHLORIDE: 114 mmol/L — AB (ref 98–111)
CO2: 23 mmol/L (ref 22–32)
CREATININE: 1.07 mg/dL — AB (ref 0.44–1.00)
Calcium: 8.4 mg/dL — ABNORMAL LOW (ref 8.9–10.3)
GFR calc Af Amer: 57 mL/min — ABNORMAL LOW (ref >60)
GFR calc non Af Amer: 49 mL/min — ABNORMAL LOW (ref >60)
GLUCOSE: 80 mg/dL (ref 70–99)
POTASSIUM: 3.5 mmol/L (ref 3.5–5.1)
Sodium: 143 mmol/L (ref 135–145)

## 2018-02-13 LAB — CBC
HCT: 28.6 % — ABNORMAL LOW (ref 36.0–46.0)
HEMOGLOBIN: 8.7 g/dL — AB (ref 12.0–15.0)
MCH: 27.7 pg (ref 26.0–34.0)
MCHC: 30.4 g/dL (ref 30.0–36.0)
MCV: 91.1 fL (ref 80.0–100.0)
Platelets: 131 10*3/uL — ABNORMAL LOW (ref 150–400)
RBC: 3.14 MIL/uL — AB (ref 3.87–5.11)
RDW: 15.1 % (ref 11.5–15.5)
WBC: 7.9 10*3/uL (ref 4.0–10.5)
nRBC: 0 % (ref 0.0–0.2)

## 2018-02-13 SURGERY — ECHOCARDIOGRAM, TRANSESOPHAGEAL
Anesthesia: Moderate Sedation

## 2018-02-13 MED ORDER — MIDAZOLAM HCL (PF) 10 MG/2ML IJ SOLN
INTRAMUSCULAR | Status: DC | PRN
  Administered 2018-02-13: 1 mg via INTRAVENOUS

## 2018-02-13 MED ORDER — BUTAMBEN-TETRACAINE-BENZOCAINE 2-2-14 % EX AERO
INHALATION_SPRAY | CUTANEOUS | Status: DC | PRN
  Administered 2018-02-13: 2 via TOPICAL

## 2018-02-13 MED ORDER — MIDAZOLAM HCL (PF) 5 MG/ML IJ SOLN
INTRAMUSCULAR | Status: AC
  Filled 2018-02-13: qty 1

## 2018-02-13 MED ORDER — CARVEDILOL 12.5 MG PO TABS
12.5000 mg | ORAL_TABLET | Freq: Two times a day (BID) | ORAL | Status: DC
  Administered 2018-02-14 – 2018-02-16 (×4): 12.5 mg via ORAL
  Filled 2018-02-13 (×4): qty 1

## 2018-02-13 MED ORDER — FENTANYL CITRATE (PF) 100 MCG/2ML IJ SOLN
INTRAMUSCULAR | Status: AC
  Filled 2018-02-13: qty 2

## 2018-02-13 NOTE — Progress Notes (Signed)
Clinical Swallowing Evaluation  02/13/18 1600  SLP Visit Information  SLP Received On 02/06/18  Subjective  Subjective upright at bedside with familiy present; requires cueing for maintaining alertness  General Information  Date of Onset 02/10/18  HPI 77 yo admitted with Left weakness and right gaze with Rt MCA CVA.  Mechanical thrombectomy attempted but failed- minimal recanalizatiobn of occluded RT MCA M1 seg. PMH significant for cardiomyopathy, TIA, HTN, CHF, Lt MCA stroke ( April 2019), and tobacco abuse   Type of Study Bedside Swallow Evaluation  Previous Swallow Assessment none on file  Diet Prior to this Study NPO  Respiratory Status Room air  Behavior/Cognition Requires cueing;Lethargic/Drowsy  Oral Cavity Assessment [facility]  Oral Care Completed by SLP Yes  Oral Cavity - Dentition Dentures, top  Self-Feeding Abilities Needs assist  Patient Positioning Upright in bed  Baseline Vocal Quality Low vocal intensity;Wet  Volitional Cough Congested;Weak  Volitional Swallow Able to elicit  Oral Motor/Sensory Function  Overall Oral Motor/Sensory Function Moderate impairment  Facial ROM Reduced left  Facial Symmetry Abnormal symmetry left  Facial Strength Reduced left  Facial Sensation Reduced left  Lingual ROM Reduced left  Lingual Strength Suspected CN XII (hypoglossal) dysfunction  Ice Chips  Ice chips Impaired  Presentation Spoon  Oral Phase Impairments Reduced labial seal;Reduced lingual movement/coordination  Pharyngeal Phase Impairments Suspected delayed Swallow;Multiple swallows;Decreased hyoid-laryngeal movement  Thin Liquid  Thin Liquid Impaired  Presentation Cup  Oral Phase Impairments Reduced labial seal;Reduced lingual movement/coordination;Poor awareness of bolus  Oral Phase Functional Implications Prolonged oral transit;Oral residue;Left anterior spillage  Pharyngeal  Phase Impairments Suspected delayed Swallow;Multiple swallows;Wet Vocal Quality;Throat Clearing -  Delayed  Nectar Thick Liquid  Nectar Thick Liquid NT  Honey Thick Liquid  Honey Thick Liquid NT  Puree  Puree Impaired  Presentation Spoon  Oral Phase Impairments Reduced labial seal  Oral Phase Functional Implications Oral residue;Prolonged oral transit  Pharyngeal Phase Impairments Multiple swallows;Suspected delayed Swallow  Solid  Solid NT  SLP - End of Session  Patient left in bed;with call [place]/phone within reach;with family/visitor present  Nurse Communication Aspiration precautions reviewed;Diet recommendation;Plan for instrumental testing  SLP Assessment  Clinical Impression Statement (ACUTE ONLY) Pt presents with a moderate oral dysphagia and suspected pharyngeal dysphagia with concern for increased aspiration risk. Left sided oral motor deficits include reduced labial seal, left facial droop, and oral residuals. Pt with poor bolus control of thin liquids with significant anterior spillage noted with small cup sip. Suspect reduced airway protection as wet vocal quality noted as well as delayed throat clearing. Baseline cough noted to be weaker and congested. Recommend proceed with instrumental assessment prior to diet initiation. Pt okay for ice chips and medicines whole in puree. Will plan for instrumental swallowing assessment next date.   SLP Visit Diagnosis Dysphagia, unspecified (R13.10)  Impact on safety and function Moderate aspiration risk  Other Related Risk Factors Previous CVA;Lethargy;Decreased management of secretions;Deconditioning;Decreased respiratory status  Swallow Evaluation Recommendations  SLP Diet Recommendations NPO  Medication Administration Whole meds with puree  Treatment Plan  Oral Care Recommendations Oral care prior to ice chip/H20;Oral care QID  Treatment Recommendations F/U MBS in ___ days (Comment)  Speech Therapy Frequency (ACUTE ONLY) min 2x/week  Treatment Duration 2 weeks  Interventions Aspiration precaution training;Oral motor  exercises;Pharyngeal strengthening exercises;Patient/family education;Trials of upgraded texture/liquids;Diet toleration management by SLP;Compensatory techniques  Prognosis  Prognosis for Safe Diet Advancement Good  Barriers to Reach Goals Time post onset  Individuals Consulted  Consulted and Agree with Results  and Recommendations Patient;Family member/caregiver;RN  Family Member Consulted family   SLP Time Calculation  SLP Start Time (ACUTE ONLY) 1630  SLP Stop Time (ACUTE ONLY) 1700  SLP Time Calculation (min) (ACUTE ONLY) 30 min  SLP Evaluations  $ SLP Speech Visit 1 Visit  SLP Evaluations  $BSS Swallow 1 Procedure   Kahne Helfand MA, CCC-SLP  Acute Rehab Speech Language Pathologist

## 2018-02-13 NOTE — Interval H&P Note (Signed)
History and Physical Interval Note:  02/13/2018 2:29 PM  Brandy [name]  has presented today for surgery, with the diagnosis of stroke  The various methods of treatment have been discussed with the patient and family. After consideration of risks, benefits and other options for treatment, the patient has consented to  Procedure(s): TRANSESOPHAGEAL ECHOCARDIOGRAM (TEE) (N/A) as a surgical intervention .  The patient's history has been reviewed, patient examined, no change in status, stable for surgery.  I have reviewed the patient's chart and labs.  Questions were answered to the patient's satisfaction.     Fransico Him

## 2018-02-13 NOTE — Consult Note (Addendum)
ELECTROPHYSIOLOGY CONSULT NOTE  Patient ID: Brandy [name] MRN: [id number], DOB/AGE: [birth year]   Admit date: 02/10/2018 Date of Consult: 02/13/2018  Primary Physician: [name], MD Primary Cardiologist: Bronson Ing Reason for Consultation: Cryptogenic stroke; recommendations regarding Implantable Loop Recorder  History of Present Illness EP has been asked to evaluate Brandy [name] for placement of an implantable loop recorder to monitor for atrial fibrillation by Dr Erlinda Hong.  The patient was admitted on 02/10/2018 with left sided weakness and right gaze preference.  She has undergone workup for stroke including echocardiogram and carotid dopplers.  The patient has been monitored on telemetry which has demonstrated sinus rhythm with frequent PAC's and runs of AT.   Inpatient stroke work-up is to be completed with a TEE.   Echocardiogram this admission demonstrated EF 40%, LA 45.  Lab work is reviewed.  Prior to admission, the patient denies chest pain, shortness of breath, dizziness, palpitations, or syncope.  They are recovering from their stroke with plans to go to CIR at discharge.   Past Medical History:  Diagnosis Date  . Cardiomyopathy    a. EF of 25% in 07/2006 b. EF normalized by repeat echo in 2011 c. EF 35-40% by echo in 05/2017 with cath showing mild nonobstructive CAD  . Chronic combined systolic and diastolic CHF (congestive heart failure) ([place])   . Colon polyps   . Diverticulosis of colon   . Hypertension   . Hypertensive heart disease   . Osteopenia   . TIA (transient ischemic attack)   . Tobacco abuse    50 pack year     Surgical History:  Past Surgical History:  Procedure Laterality Date  . BREAST BIOPSY    . CARPAL TUNNEL RELEASE     right hand  . CERVICAL FUSION    . CESAREAN SECTION     2 times  . COLONOSCOPY  6606   pt uncertain as to whether polypectomy required or performed  . ECTOPIC PREGNANCY SURGERY    . RADIOLOGY WITH ANESTHESIA N/A  02/10/2018   Procedure: RADIOLOGY WITH ANESTHESIA;  Surgeon: Luanne Bras, MD;  Location: [place] [facility];  Service: Radiology;  Laterality: N/A;  . RIGHT/LEFT HEART CATH AND CORONARY ANGIOGRAPHY N/A 06/03/2017   Procedure: RIGHT/LEFT HEART CATH AND CORONARY ANGIOGRAPHY;  Surgeon: Jettie Booze, MD;  Location: [facility];  Service: Cardiovascular;  Laterality: N/A;  . TONSILLECTOMY       Medications Prior to Admission  Medication Sig Dispense Refill Last Dose  . aspirin EC 81 MG EC tablet Take 2 tablets (162 mg total) by mouth daily. 30 tablet 1 02/09/2018 at 800  . atorvastatin (LIPITOR) 10 MG tablet TAKE 1 TABLET BY MOUTH EVERY DAY AT 6 PM 30 tablet 6 02/09/2018 at Unknown time  . BIDIL 20-37.5 MG tablet TAKE 1 TABLET BY MOUTH THREE TIMES DAILY 90 tablet 6 02/09/2018 at Unknown time  . carvedilol (COREG) 12.5 MG tablet Take 1 tablet (12.5 mg total) by mouth 2 (two) times daily with a meal. 60 tablet 6 02/09/2018 at 2100  . cetirizine (ZYRTEC) 10 MG tablet Take 10 mg by mouth daily as needed for allergies.    02/09/2018 at Unknown time  . donepezil (ARICEPT) 5 MG tablet Take 1 tablet by mouth at bedtime.  1 02/09/2018 at Unknown time  . furosemide (LASIX) 20 MG tablet Take 1 tablet (20 mg total) by mouth daily as needed for edema. 90 tablet 3 02/09/2018 at Unknown time  . predniSONE (DELTASONE)  5 MG tablet Take 5 mg by mouth daily as needed (for swelling and right knee pain).   Taking  . umeclidinium-vilanterol (ANORO ELLIPTA) 62.5-25 MCG/INH AEPB Inhale 1 puff into the lungs daily. 60 each 5 02/09/2018 at Unknown time    Inpatient Medications:  . aspirin  325 mg Oral Daily  . atorvastatin  10 mg Oral q1800  . chlorhexidine  15 mL Mouth Rinse BID  . clopidogrel  75 mg Oral Daily   Or  . clopidogrel  75 mg Per Tube Daily  . enoxaparin (LOVENOX) injection  40 mg Subcutaneous Q24H  . mouth rinse  15 mL Mouth Rinse q12n4p  . umeclidinium-vilanterol  1 puff Inhalation Daily     Allergies:  Allergies  Allergen Reactions  . Lisinopril Swelling    Social History   Socioeconomic History  . Marital status: Married    Spouse name: Not on file  . Number of children: Not on file  . Years of education: Not on file  . Highest education level: Not on file  Occupational History  . Occupation: Former Product manager: RETIRED  Social Needs  . Financial resource strain: Not on file  . Food insecurity:    Worry: Not on file    Inability: Not on file  . Transportation needs:    Medical: Not on file    Non-medical: Not on file  Tobacco Use  . Smoking status: Former Smoker    Packs/day: 1.00    Years: 50.00    Pack years: 50.00    Types: Cigarettes    Start date: 09/02/1960    Last attempt to quit: 03/29/2008    Years since quitting: 9.8  . Smokeless tobacco: Never Used  Substance and Sexual Activity  . Alcohol use: No    Alcohol/week: 0.0 standard drinks  . Drug use: Never  . Sexual activity: Not Currently  Lifestyle  . Physical activity:    Days per week: Not on file    Minutes per session: Not on file  . Stress: Not on file  Relationships  . Social connections:    Talks on phone: Not on file    Gets together: Not on file    Attends religious service: Not on file    Active member of club or organization: Not on file    Attends meetings of clubs or organizations: Not on file    Relationship status: Not on file  . Intimate partner violence:    Fear of current or ex partner: Not on file    Emotionally abused: Not on file    Physically abused: Not on file    Forced sexual activity: Not on file  Other Topics Concern  . Not on file  Social History Narrative   Married with 2 adult children     Family History  Problem Relation Age of Onset  . Hypertension Brother   . Hypertension Maternal Grandmother   . Cancer Paternal Grandmother       Review of Systems: All other systems reviewed and are otherwise negative except as noted  above.  Physical Exam: Vitals:   02/13/18 0000 02/13/18 0050 02/13/18 0400 02/13/18 0900  BP:  (!) 152/60 140/74   Pulse:  84 (!) 59   Resp:  (!) 22 14   Temp: 98.9 F (37.2 C)  99.5 F (37.5 C)   TempSrc: Oral  Oral   SpO2:  100% 97% 100%  Weight:      Height:  GEN- The patient is elderly appearing, sleeping but arouses Head- normocephalic, atraumatic Eyes-  Sclera clear, conjunctiva pink Ears- hearing intact Oropharynx- clear Neck- supple Lungs- Clear to ausculation bilaterally, normal work of breathing Heart- Regular rate and rhythm  GI- soft, NT, ND, + BS Extremities- no clubbing, cyanosis, or edema MS- no significant deformity or atrophy Skin- no rash or lesion Psych- euthymic mood, full affect   Labs:   Lab Results  Component Value Date   WBC 7.9 02/13/2018   HGB 8.7 (L) 02/13/2018   HCT 28.6 (L) 02/13/2018   MCV 91.1 02/13/2018   PLT 131 (L) 02/13/2018    Recent Labs  Lab 02/10/18 1036  02/13/18 0436  NA 144   < > 143  K 3.7   < > 3.5  CL 110   < > 114*  CO2 27   < > 23  BUN 13   < > 13  CREATININE 1.05*   < > 1.07*  CALCIUM 8.8*   < > 8.4*  PROT 5.9*  --   --   BILITOT 0.8  --   --   ALKPHOS 54  --   --   ALT 14  --   --   AST 18  --   --   GLUCOSE 89   < > 80   < > = values in this interval not displayed.     Radiology/Studies: Ct Angio Head W Or Wo Contrast  Result Date: 02/10/2018 CLINICAL DATA:  Acute onset of LEFT-sided weakness. Slurred speech. Last seen normal earlier 0930 hours. EXAM: CT ANGIOGRAPHY HEAD AND NECK CT PERFUSION BRAIN TECHNIQUE: Multidetector CT imaging of the head and neck was performed using the standard protocol during bolus administration of intravenous contrast. Multiplanar CT image reconstructions and MIPs were obtained to evaluate the vascular anatomy. Carotid stenosis measurements (when applicable) are obtained utilizing NASCET criteria, using the distal internal carotid diameter as the denominator.  Multiphase CT imaging of the brain was performed following IV bolus contrast injection. Subsequent parametric perfusion maps were calculated using RAPID software. CONTRAST:  187mL ISOVUE-370 IOPAMIDOL (ISOVUE-370) INJECTION 76% COMPARISON:  Code stroke CT head earlier today. FINDINGS: CTA NECK FINDINGS Aortic arch: Standard branching. Imaged portion shows no evidence of aneurysm or dissection. No significant stenosis of the major arch vessel origins. Right carotid system: No evidence of dissection, stenosis (50% or greater) or occlusion. Left carotid system: No evidence of dissection, stenosis (50% or greater) or occlusion. Vertebral arteries: Codominant. No evidence of dissection, stenosis (50% or greater) or occlusion. Skeleton: Spondylosis.  Prior C5-C7 ACDF. Other neck: No masses. Upper chest: Mild edema.  No pneumothorax. Review of the MIP images confirms the above findings CTA HEAD FINDINGS Anterior circulation: Minor calcific atheromatous change in the LEFT carotid siphon. No significant calcification on the RIGHT. ICA termini widely patent. Acute RIGHT M1 MCA occlusion. No similar findings on the LEFT. Collateralization provides fair to good opacification of the M2 and M3 branches on the RIGHT. Posterior circulation: No significant stenosis, proximal occlusion, aneurysm, or vascular malformation. Venous sinuses: As permitted by contrast timing, patent. Anatomic variants: None significant. Delayed phase: Not performed. Review of the MIP images confirms the above findings CT Brain Perfusion Findings: CBF (<30%) Volume: 9.28mL Perfusion (Tmax>6.0s) volume: 102.52mL Mismatch Volume: 93.30mL Infarction Location:RIGHT basal ganglia, and regional white matter. IMPRESSION: Acute RIGHT M1 MCA occlusion.  Fair to good collateralization. No extracranial stenosis/dissection or carotid siphon stenosis. Significant mismatch volume of 93 mL, encompassing much of the  RIGHT MCA territory. See discussion above. These results  were called by telephone at the time of interpretation on 02/10/2018 at 11:13 am to Dr. Francine Graven , who verbally acknowledged these results. Electronically Signed   By: Staci Righter M.D.   On: 02/10/2018 11:36   Ct Angio Neck W Or Wo Contrast  Result Date: 02/10/2018 CLINICAL DATA:  Acute onset of LEFT-sided weakness. Slurred speech. Last seen normal earlier 0930 hours. EXAM: CT ANGIOGRAPHY HEAD AND NECK CT PERFUSION BRAIN TECHNIQUE: Multidetector CT imaging of the head and neck was performed using the standard protocol during bolus administration of intravenous contrast. Multiplanar CT image reconstructions and MIPs were obtained to evaluate the vascular anatomy. Carotid stenosis measurements (when applicable) are obtained utilizing NASCET criteria, using the distal internal carotid diameter as the denominator. Multiphase CT imaging of the brain was performed following IV bolus contrast injection. Subsequent parametric perfusion maps were calculated using RAPID software. CONTRAST:  1104mL ISOVUE-370 IOPAMIDOL (ISOVUE-370) INJECTION 76% COMPARISON:  Code stroke CT head earlier today. FINDINGS: CTA NECK FINDINGS Aortic arch: Standard branching. Imaged portion shows no evidence of aneurysm or dissection. No significant stenosis of the major arch vessel origins. Right carotid system: No evidence of dissection, stenosis (50% or greater) or occlusion. Left carotid system: No evidence of dissection, stenosis (50% or greater) or occlusion. Vertebral arteries: Codominant. No evidence of dissection, stenosis (50% or greater) or occlusion. Skeleton: Spondylosis.  Prior C5-C7 ACDF. Other neck: No masses. Upper chest: Mild edema.  No pneumothorax. Review of the MIP images confirms the above findings CTA HEAD FINDINGS Anterior circulation: Minor calcific atheromatous change in the LEFT carotid siphon. No significant calcification on the RIGHT. ICA termini widely patent. Acute RIGHT M1 MCA occlusion. No similar  findings on the LEFT. Collateralization provides fair to good opacification of the M2 and M3 branches on the RIGHT. Posterior circulation: No significant stenosis, proximal occlusion, aneurysm, or vascular malformation. Venous sinuses: As permitted by contrast timing, patent. Anatomic variants: None significant. Delayed phase: Not performed. Review of the MIP images confirms the above findings CT Brain Perfusion Findings: CBF (<30%) Volume: 9.26mL Perfusion (Tmax>6.0s) volume: 102.22mL Mismatch Volume: 93.35mL Infarction Location:RIGHT basal ganglia, and regional white matter. IMPRESSION: Acute RIGHT M1 MCA occlusion.  Fair to good collateralization. No extracranial stenosis/dissection or carotid siphon stenosis. Significant mismatch volume of 93 mL, encompassing much of the RIGHT MCA territory. See discussion above. These results were called by telephone at the time of interpretation on 02/10/2018 at 11:13 am to Dr. Francine Graven , who verbally acknowledged these results. Electronically Signed   By: Staci Righter M.D.   On: 02/10/2018 11:36   Mr Brain Wo Contrast  Result Date: 02/12/2018 CLINICAL DATA:  Stroke follow-up.  Right M1 occlusion. EXAM: MRI HEAD WITHOUT CONTRAST MRA HEAD WITHOUT CONTRAST TECHNIQUE: Multiplanar, multiecho pulse sequences of the brain and surrounding structures were obtained without intravenous contrast. Angiographic images of the head were obtained using MRA technique without contrast. COMPARISON:  Head CT/CTA 02/10/2018 FINDINGS: MRI HEAD FINDINGS BRAIN: There is multifocal diffusion restriction throughout the right MCA territory, predominantly within the right basal ganglia and insula. There is mild mass effect on the frontal horn of the right lateral ventricle. The midline structures are normal. There is cytotoxic edema within the right MCA distribution according to the areas ischemia. There is petechial hemorrhage within the right basal ganglia. The CSF spaces are normal for age,  with no hydrocephalus. Susceptibility-sensitive sequences show no chronic microhemorrhage or superficial siderosis. SKULL AND UPPER  CERVICAL SPINE: The visualized skull base, calvarium, upper cervical spine and extracranial soft tissues are normal. SINUSES/ORBITS: No fluid levels or advanced mucosal thickening. No mastoid or middle ear effusion. The orbits are normal. MRA HEAD FINDINGS Intracranial internal carotid arteries: Normal. Anterior cerebral arteries: Normal. Middle cerebral arteries: The M1 segment of the right middle cerebral artery remains occluded. There is return of flow related enhancement within the proximal M2 branches. Left MCA is normal. Posterior communicating arteries: Not visualized Posterior cerebral arteries: Normal. Basilar artery: Normal. Vertebral arteries: Codominant.  Normal. Superior cerebellar arteries: Normal. Inferior cerebellar arteries: Normal. IMPRESSION: 1. Persistent occlusion of the right middle cerebral artery M1 segment. There is flow related enhancement within the right M2 branches. 2. Large area infarction within the right middle cerebral artery territory, predominantly involving the anterior insula and the basal ganglia. 3. Petechial hemorrhage within the right basal ganglia. Heidelberg classification 1b: HI2, confluent petechiae, no mass effect. Electronically Signed   By: Ulyses Jarred M.D.   On: 02/12/2018 00:25   Ct Cerebral Perfusion W Contrast  Result Date: 02/10/2018 CLINICAL DATA:  Acute onset of LEFT-sided weakness. Slurred speech. Last seen normal earlier 0930 hours. EXAM: CT ANGIOGRAPHY HEAD AND NECK CT PERFUSION BRAIN TECHNIQUE: Multidetector CT imaging of the head and neck was performed using the standard protocol during bolus administration of intravenous contrast. Multiplanar CT image reconstructions and MIPs were obtained to evaluate the vascular anatomy. Carotid stenosis measurements (when applicable) are obtained utilizing NASCET criteria, using the  distal internal carotid diameter as the denominator. Multiphase CT imaging of the brain was performed following IV bolus contrast injection. Subsequent parametric perfusion maps were calculated using RAPID software. CONTRAST:  122mL ISOVUE-370 IOPAMIDOL (ISOVUE-370) INJECTION 76% COMPARISON:  Code stroke CT head earlier today. FINDINGS: CTA NECK FINDINGS Aortic arch: Standard branching. Imaged portion shows no evidence of aneurysm or dissection. No significant stenosis of the major arch vessel origins. Right carotid system: No evidence of dissection, stenosis (50% or greater) or occlusion. Left carotid system: No evidence of dissection, stenosis (50% or greater) or occlusion. Vertebral arteries: Codominant. No evidence of dissection, stenosis (50% or greater) or occlusion. Skeleton: Spondylosis.  Prior C5-C7 ACDF. Other neck: No masses. Upper chest: Mild edema.  No pneumothorax. Review of the MIP images confirms the above findings CTA HEAD FINDINGS Anterior circulation: Minor calcific atheromatous change in the LEFT carotid siphon. No significant calcification on the RIGHT. ICA termini widely patent. Acute RIGHT M1 MCA occlusion. No similar findings on the LEFT. Collateralization provides fair to good opacification of the M2 and M3 branches on the RIGHT. Posterior circulation: No significant stenosis, proximal occlusion, aneurysm, or vascular malformation. Venous sinuses: As permitted by contrast timing, patent. Anatomic variants: None significant. Delayed phase: Not performed. Review of the MIP images confirms the above findings CT Brain Perfusion Findings: CBF (<30%) Volume: 9.97mL Perfusion (Tmax>6.0s) volume: 102.48mL Mismatch Volume: 93.68mL Infarction Location:RIGHT basal ganglia, and regional white matter. IMPRESSION: Acute RIGHT M1 MCA occlusion.  Fair to good collateralization. No extracranial stenosis/dissection or carotid siphon stenosis. Significant mismatch volume of 93 mL, encompassing much of the RIGHT  MCA territory. See discussion above. These results were called by telephone at the time of interpretation on 02/10/2018 at 11:13 am to Dr. Francine Graven , who verbally acknowledged these results. Electronically Signed   By: Staci Righter M.D.   On: 02/10/2018 11:36   Mr Jodene Nam Head Wo Contrast  Result Date: 02/12/2018 CLINICAL DATA:  Stroke follow-up.  Right M1 occlusion. EXAM: MRI HEAD  WITHOUT CONTRAST MRA HEAD WITHOUT CONTRAST TECHNIQUE: Multiplanar, multiecho pulse sequences of the brain and surrounding structures were obtained without intravenous contrast. Angiographic images of the head were obtained using MRA technique without contrast. COMPARISON:  Head CT/CTA 02/10/2018 FINDINGS: MRI HEAD FINDINGS BRAIN: There is multifocal diffusion restriction throughout the right MCA territory, predominantly within the right basal ganglia and insula. There is mild mass effect on the frontal horn of the right lateral ventricle. The midline structures are normal. There is cytotoxic edema within the right MCA distribution according to the areas ischemia. There is petechial hemorrhage within the right basal ganglia. The CSF spaces are normal for age, with no hydrocephalus. Susceptibility-sensitive sequences show no chronic microhemorrhage or superficial siderosis. SKULL AND UPPER CERVICAL SPINE: The visualized skull base, calvarium, upper cervical spine and extracranial soft tissues are normal. SINUSES/ORBITS: No fluid levels or advanced mucosal thickening. No mastoid or middle ear effusion. The orbits are normal. MRA HEAD FINDINGS Intracranial internal carotid arteries: Normal. Anterior cerebral arteries: Normal. Middle cerebral arteries: The M1 segment of the right middle cerebral artery remains occluded. There is return of flow related enhancement within the proximal M2 branches. Left MCA is normal. Posterior communicating arteries: Not visualized Posterior cerebral arteries: Normal. Basilar artery: Normal. Vertebral  arteries: Codominant.  Normal. Superior cerebellar arteries: Normal. Inferior cerebellar arteries: Normal. IMPRESSION: 1. Persistent occlusion of the right middle cerebral artery M1 segment. There is flow related enhancement within the right M2 branches. 2. Large area infarction within the right middle cerebral artery territory, predominantly involving the anterior insula and the basal ganglia. 3. Petechial hemorrhage within the right basal ganglia. Heidelberg classification 1b: HI2, confluent petechiae, no mass effect. Electronically Signed   By: Ulyses Jarred M.D.   On: 02/12/2018 00:25   Ct Head Code Stroke Wo Contrast  Result Date: 02/10/2018 CLINICAL DATA:  Code stroke. LEFT-sided weakness. Patient drooling in bed. Last seen normal 0930 hours EXAM: CT HEAD WITHOUT CONTRAST TECHNIQUE: Contiguous axial images were obtained from the base of the skull through the vertex without intravenous contrast. COMPARISON:  CT head 07/12/2017. MR head and MRA intracranial 07/13/2017. CT angiography and CT perfusion reported separately. FINDINGS: Brain: Well-demarcated areas of cortical hypodensity affecting the RIGHT temporal lobe, and RIGHT posterior frontal lobe, most consistent with subacute infarction. Ill-defined areas of low-attenuation in the RIGHT frontal cortex, RIGHT insula, and suspected RIGHT basal ganglia consistent with acute infarction. No hemorrhage, mass lesion, hydrocephalus, or extra-axial fluid. Chronic LEFT hemisphere infarcts. Vascular: Hyperdense M1 MCA Skull: Normal. Negative for fracture or focal lesion. Sinuses/Orbits: No acute finding. Other: None ASPECTS ([place] Stroke Program Early CT Score) - Ganglionic level infarction (caudate, lentiform nuclei, internal capsule, insula, M1-M3 cortex): 4 - Supraganglionic infarction (M4-M6 cortex): 3 Total score (0-10 with 10 being normal): 7 IMPRESSION: 1. Mixed pattern of both acute and suspected subacute RIGHT hemisphere ischemia, in the setting of  hyperdense RIGHT M1 MCA and acute LEFT-sided weakness, consistent with emergent large vessel occlusion of the RIGHT proximal MCA. No hemorrhage. 2. ASPECTS is 7. These results were called by telephone at the time of interpretation on 02/10/2018 at 11:24 am to Dr. Francine Graven , who verbally acknowledged these results. Electronically Signed   By: Staci Righter M.D.   On: 02/10/2018 11:25   Vas [place] Lower Extremity Venous (dvt)  Result Date: 02/12/2018  Lower Venous Study Indications: Embolic stroke.  Limitations: Body habitus, bandages and immobility. Performing Technologist: Lorina Rabon  Examination Guidelines: A complete evaluation includes B-mode imaging, spectral Doppler,  color Doppler, and power Doppler as needed of all accessible portions of each vessel. Bilateral testing is considered an integral part of a complete examination. Limited examinations for reoccurring indications may be performed as noted.  Right Venous Findings: +---------+---------------+---------+-----------+----------+-------+          CompressibilityPhasicitySpontaneityPropertiesSummary +---------+---------------+---------+-----------+----------+-------+ CFV      Full           Yes      Yes                          +---------+---------------+---------+-----------+----------+-------+ SFJ      Full                                                 +---------+---------------+---------+-----------+----------+-------+ FV Prox  Full                                                 +---------+---------------+---------+-----------+----------+-------+ FV Mid   Full                                                 +---------+---------------+---------+-----------+----------+-------+ FV DistalFull                                                 +---------+---------------+---------+-----------+----------+-------+ PFV      Full                                                  +---------+---------------+---------+-----------+----------+-------+ POP      Full           Yes      Yes                          +---------+---------------+---------+-----------+----------+-------+ PTV      Full                                                 +---------+---------------+---------+-----------+----------+-------+ PERO     Full                                                 +---------+---------------+---------+-----------+----------+-------+ A cystic structure with non-vascular feature seen at right popliteal fossa medial, measuring 2.54x3.24x0.85cm.  Left Venous Findings: +---------+---------------+---------+-----------+----------+-------+          CompressibilityPhasicitySpontaneityPropertiesSummary +---------+---------------+---------+-----------+----------+-------+ CFV      Full           Yes      Yes                          +---------+---------------+---------+-----------+----------+-------+  SFJ      Full                                                 +---------+---------------+---------+-----------+----------+-------+ FV Prox  Full                                                 +---------+---------------+---------+-----------+----------+-------+ FV Mid   Full                                                 +---------+---------------+---------+-----------+----------+-------+ FV DistalFull                                                 +---------+---------------+---------+-----------+----------+-------+ PFV      Full                                                 +---------+---------------+---------+-----------+----------+-------+ POP      Full           Yes      Yes                          +---------+---------------+---------+-----------+----------+-------+ PTV      Full                                                 +---------+---------------+---------+-----------+----------+-------+ PERO     Full                                                  +---------+---------------+---------+-----------+----------+-------+ A cystic structure with internal echoes, non-vascular feature seen at left popliteal fossa medial, measuring 1.36x2.09x0.98cm.    Summary: Right: There is no evidence of deep vein thrombosis in the lower extremity. Left: There is no evidence of deep vein thrombosis in the lower extremity.  *See table(s) above for measurements and observations. Electronically signed by Ruta Hinds MD on 02/12/2018 at 9:14:26 AM.    Final     12-lead ECG sinus rhythm (personally reviewed) All prior EKG's in [facility] reviewed with no documented atrial fibrillation  Telemetry sinus rhythm, PVC's, runs of AT (personally reviewed)  Assessment and Plan:  1. Cryptogenic stroke The patient presents with cryptogenic stroke.  The patient has a TEE planned for this AM.  I spoke at length with the patient about monitoring for afib.  Because of LA enlargement and frequent atrial ectopy on telemetry, I think it is most prudent to start with 30 day event monitor.  Will arrange through our [facility] office as  well as follow up once event monitor completed.   2.  Cardiomyopathy/chronic systolic heart failure Continue current therapy She follows with Dr Bronson Ing in [facility]  Please call with questions.   Chanetta Marshall, NP 02/13/2018 9:05 AM  EP Attending  Patient seen and examined. Agree with above. I discussed the situation with the patient and her brother an MD from [place]. She has had a cryptogenic stroke and has a dense HP. She has frequent atrial ectopy and is at high risk for developing atrial fib. We will ask her to wear a 30 day cardiac event monitor. Her CHF symptoms are currently controlled.  Mikle Bosworth.D.

## 2018-02-13 NOTE — Progress Notes (Addendum)
Referring Physician(s): CODE STROKE- Aroor, Lanice Schwab  Supervising Physician: Luanne Bras  Patient Status:  [facility] - In-pt  Chief Complaint: None  Subjective:  Right MCA M1 segment occlusion s/p revascularization using emergent mechanical thrombectomy 02/10/2018 by Dr. Estanislado Pandy. Patient awake and alert sitting in chair. Accompanied by 3 family members at bedside. No spontaneous movement of left side. Speech is moderately dysarthric. Demonstrates left facial droop and tongue protrudes to left.   Allergies: Lisinopril  Medications: Prior to Admission medications   Medication Sig Start Date End Date Taking? Authorizing Provider  aspirin EC 81 MG EC tablet Take 2 tablets (162 mg total) by mouth daily. 07/14/17  Yes Barton Dubois, MD  atorvastatin (LIPITOR) 10 MG tablet TAKE 1 TABLET BY MOUTH EVERY DAY AT 6 PM 09/12/17  Yes Herminio Commons, MD  BIDIL 20-37.5 MG tablet TAKE 1 TABLET BY MOUTH THREE TIMES DAILY 09/12/17  Yes Herminio Commons, MD  carvedilol (COREG) 12.5 MG tablet Take 1 tablet (12.5 mg total) by mouth 2 (two) times daily with a meal. 08/25/17  Yes Herminio Commons, MD  cetirizine (ZYRTEC) 10 MG tablet Take 10 mg by mouth daily as needed for allergies.    Yes [provider]  donepezil (ARICEPT) 5 MG tablet Take 1 tablet by mouth at bedtime. 11/15/17  Yes [provider]  furosemide (LASIX) 20 MG tablet Take 1 tablet (20 mg total) by mouth daily as needed for edema. 02/08/18 05/09/18 Yes Strader, Fransisco Hertz, PA-C  predniSONE (DELTASONE) 5 MG tablet Take 5 mg by mouth daily as needed (for swelling and right knee pain).   Yes [provider]  umeclidinium-vilanterol (ANORO ELLIPTA) 62.5-25 MCG/INH AEPB Inhale 1 puff into the lungs daily. 12/08/17  Yes Lauraine Rinne, NP     Vital Signs: BP 140/75   Pulse 76   Temp 98.8 F (37.1 C) (Oral)   Resp 20   Ht 5\' 4"  (1.626 m)   Wt 151 lb 0.2 oz (68.5 kg)   SpO2 100%   BMI 25.92  kg/m   Physical Exam  Constitutional: She appears well-developed and well-nourished. No distress.  Pulmonary/Chest: Effort normal. No respiratory distress.  Neurological:  Alert, awake, and oriented x3. Demonstrates moderate dysarthric speech. PERRL bilaterally. EOMs intact bilaterally without nystagmus or subjective diplopia. Visual fields not assessed. Demonstrates left facial droop. Tongue deviates to left. Can spontaneously move right side but no spontaneous movements of left side. Pronator drift not assessed. Fine motor and coordination not assessed. Gait not assessed. Romberg not assessed. Heel to toe not assessed. Distal pulses 2+ bilaterally.  Skin: Skin is warm and dry.  Right groin incision soft without active bleeding or hematoma.  Psychiatric: She has a normal mood and affect. Her behavior is normal. Judgment and thought content normal.  Nursing note and vitals reviewed.   Imaging: Ct Angio Head W Or Wo Contrast  Result Date: 02/10/2018 CLINICAL DATA:  Acute onset of LEFT-sided weakness. Slurred speech. Last seen normal earlier 0930 hours. EXAM: CT ANGIOGRAPHY HEAD AND NECK CT PERFUSION BRAIN TECHNIQUE: Multidetector CT imaging of the head and neck was performed using the standard protocol during bolus administration of intravenous contrast. Multiplanar CT image reconstructions and MIPs were obtained to evaluate the vascular anatomy. Carotid stenosis measurements (when applicable) are obtained utilizing NASCET criteria, using the distal internal carotid diameter as the denominator. Multiphase CT imaging of the brain was performed following IV bolus contrast injection. Subsequent parametric perfusion maps were calculated using RAPID  software. CONTRAST:  120mL ISOVUE-370 IOPAMIDOL (ISOVUE-370) INJECTION 76% COMPARISON:  Code stroke CT head earlier today. FINDINGS: CTA NECK FINDINGS Aortic arch: Standard branching. Imaged portion shows no evidence of aneurysm or dissection. No  significant stenosis of the major arch vessel origins. Right carotid system: No evidence of dissection, stenosis (50% or greater) or occlusion. Left carotid system: No evidence of dissection, stenosis (50% or greater) or occlusion. Vertebral arteries: Codominant. No evidence of dissection, stenosis (50% or greater) or occlusion. Skeleton: Spondylosis.  Prior C5-C7 ACDF. Other neck: No masses. Upper chest: Mild edema.  No pneumothorax. Review of the MIP images confirms the above findings CTA HEAD FINDINGS Anterior circulation: Minor calcific atheromatous change in the LEFT carotid siphon. No significant calcification on the RIGHT. ICA termini widely patent. Acute RIGHT M1 MCA occlusion. No similar findings on the LEFT. Collateralization provides fair to good opacification of the M2 and M3 branches on the RIGHT. Posterior circulation: No significant stenosis, proximal occlusion, aneurysm, or vascular malformation. Venous sinuses: As permitted by contrast timing, patent. Anatomic variants: None significant. Delayed phase: Not performed. Review of the MIP images confirms the above findings CT Brain Perfusion Findings: CBF (<30%) Volume: 9.22mL Perfusion (Tmax>6.0s) volume: 102.63mL Mismatch Volume: 93.69mL Infarction Location:RIGHT basal ganglia, and regional white matter. IMPRESSION: Acute RIGHT M1 MCA occlusion.  Fair to good collateralization. No extracranial stenosis/dissection or carotid siphon stenosis. Significant mismatch volume of 93 mL, encompassing much of the RIGHT MCA territory. See discussion above. These results were called by telephone at the time of interpretation on 02/10/2018 at 11:13 am to Dr. Francine Graven , who verbally acknowledged these results. Electronically Signed   By: Staci Righter M.D.   On: 02/10/2018 11:36   Ct Angio Neck W Or Wo Contrast  Result Date: 02/10/2018 CLINICAL DATA:  Acute onset of LEFT-sided weakness. Slurred speech. Last seen normal earlier 0930 hours. EXAM: CT  ANGIOGRAPHY HEAD AND NECK CT PERFUSION BRAIN TECHNIQUE: Multidetector CT imaging of the head and neck was performed using the standard protocol during bolus administration of intravenous contrast. Multiplanar CT image reconstructions and MIPs were obtained to evaluate the vascular anatomy. Carotid stenosis measurements (when applicable) are obtained utilizing NASCET criteria, using the distal internal carotid diameter as the denominator. Multiphase CT imaging of the brain was performed following IV bolus contrast injection. Subsequent parametric perfusion maps were calculated using RAPID software. CONTRAST:  182mL ISOVUE-370 IOPAMIDOL (ISOVUE-370) INJECTION 76% COMPARISON:  Code stroke CT head earlier today. FINDINGS: CTA NECK FINDINGS Aortic arch: Standard branching. Imaged portion shows no evidence of aneurysm or dissection. No significant stenosis of the major arch vessel origins. Right carotid system: No evidence of dissection, stenosis (50% or greater) or occlusion. Left carotid system: No evidence of dissection, stenosis (50% or greater) or occlusion. Vertebral arteries: Codominant. No evidence of dissection, stenosis (50% or greater) or occlusion. Skeleton: Spondylosis.  Prior C5-C7 ACDF. Other neck: No masses. Upper chest: Mild edema.  No pneumothorax. Review of the MIP images confirms the above findings CTA HEAD FINDINGS Anterior circulation: Minor calcific atheromatous change in the LEFT carotid siphon. No significant calcification on the RIGHT. ICA termini widely patent. Acute RIGHT M1 MCA occlusion. No similar findings on the LEFT. Collateralization provides fair to good opacification of the M2 and M3 branches on the RIGHT. Posterior circulation: No significant stenosis, proximal occlusion, aneurysm, or vascular malformation. Venous sinuses: As permitted by contrast timing, patent. Anatomic variants: None significant. Delayed phase: Not performed. Review of the MIP images confirms the above findings CT  Brain Perfusion Findings: CBF (<30%) Volume: 9.18mL Perfusion (Tmax>6.0s) volume: 102.37mL Mismatch Volume: 93.3mL Infarction Location:RIGHT basal ganglia, and regional white matter. IMPRESSION: Acute RIGHT M1 MCA occlusion.  Fair to good collateralization. No extracranial stenosis/dissection or carotid siphon stenosis. Significant mismatch volume of 93 mL, encompassing much of the RIGHT MCA territory. See discussion above. These results were called by telephone at the time of interpretation on 02/10/2018 at 11:13 am to Dr. Francine Graven , who verbally acknowledged these results. Electronically Signed   By: Staci Righter M.D.   On: 02/10/2018 11:36   Mr Brain Wo Contrast  Result Date: 02/12/2018 CLINICAL DATA:  Stroke follow-up.  Right M1 occlusion. EXAM: MRI HEAD WITHOUT CONTRAST MRA HEAD WITHOUT CONTRAST TECHNIQUE: Multiplanar, multiecho pulse sequences of the brain and surrounding structures were obtained without intravenous contrast. Angiographic images of the head were obtained using MRA technique without contrast. COMPARISON:  Head CT/CTA 02/10/2018 FINDINGS: MRI HEAD FINDINGS BRAIN: There is multifocal diffusion restriction throughout the right MCA territory, predominantly within the right basal ganglia and insula. There is mild mass effect on the frontal horn of the right lateral ventricle. The midline structures are normal. There is cytotoxic edema within the right MCA distribution according to the areas ischemia. There is petechial hemorrhage within the right basal ganglia. The CSF spaces are normal for age, with no hydrocephalus. Susceptibility-sensitive sequences show no chronic microhemorrhage or superficial siderosis. SKULL AND UPPER CERVICAL SPINE: The visualized skull base, calvarium, upper cervical spine and extracranial soft tissues are normal. SINUSES/ORBITS: No fluid levels or advanced mucosal thickening. No mastoid or middle ear effusion. The orbits are normal. MRA HEAD FINDINGS  Intracranial internal carotid arteries: Normal. Anterior cerebral arteries: Normal. Middle cerebral arteries: The M1 segment of the right middle cerebral artery remains occluded. There is return of flow related enhancement within the proximal M2 branches. Left MCA is normal. Posterior communicating arteries: Not visualized Posterior cerebral arteries: Normal. Basilar artery: Normal. Vertebral arteries: Codominant.  Normal. Superior cerebellar arteries: Normal. Inferior cerebellar arteries: Normal. IMPRESSION: 1. Persistent occlusion of the right middle cerebral artery M1 segment. There is flow related enhancement within the right M2 branches. 2. Large area infarction within the right middle cerebral artery territory, predominantly involving the anterior insula and the basal ganglia. 3. Petechial hemorrhage within the right basal ganglia. Heidelberg classification 1b: HI2, confluent petechiae, no mass effect. Electronically Signed   By: Ulyses Jarred M.D.   On: 02/12/2018 00:25   Ct Cerebral Perfusion W Contrast  Result Date: 02/10/2018 CLINICAL DATA:  Acute onset of LEFT-sided weakness. Slurred speech. Last seen normal earlier 0930 hours. EXAM: CT ANGIOGRAPHY HEAD AND NECK CT PERFUSION BRAIN TECHNIQUE: Multidetector CT imaging of the head and neck was performed using the standard protocol during bolus administration of intravenous contrast. Multiplanar CT image reconstructions and MIPs were obtained to evaluate the vascular anatomy. Carotid stenosis measurements (when applicable) are obtained utilizing NASCET criteria, using the distal internal carotid diameter as the denominator. Multiphase CT imaging of the brain was performed following IV bolus contrast injection. Subsequent parametric perfusion maps were calculated using RAPID software. CONTRAST:  124mL ISOVUE-370 IOPAMIDOL (ISOVUE-370) INJECTION 76% COMPARISON:  Code stroke CT head earlier today. FINDINGS: CTA NECK FINDINGS Aortic arch: Standard branching.  Imaged portion shows no evidence of aneurysm or dissection. No significant stenosis of the major arch vessel origins. Right carotid system: No evidence of dissection, stenosis (50% or greater) or occlusion. Left carotid system: No evidence of dissection, stenosis (50% or greater) or occlusion. Vertebral  arteries: Codominant. No evidence of dissection, stenosis (50% or greater) or occlusion. Skeleton: Spondylosis.  Prior C5-C7 ACDF. Other neck: No masses. Upper chest: Mild edema.  No pneumothorax. Review of the MIP images confirms the above findings CTA HEAD FINDINGS Anterior circulation: Minor calcific atheromatous change in the LEFT carotid siphon. No significant calcification on the RIGHT. ICA termini widely patent. Acute RIGHT M1 MCA occlusion. No similar findings on the LEFT. Collateralization provides fair to good opacification of the M2 and M3 branches on the RIGHT. Posterior circulation: No significant stenosis, proximal occlusion, aneurysm, or vascular malformation. Venous sinuses: As permitted by contrast timing, patent. Anatomic variants: None significant. Delayed phase: Not performed. Review of the MIP images confirms the above findings CT Brain Perfusion Findings: CBF (<30%) Volume: 9.38mL Perfusion (Tmax>6.0s) volume: 102.44mL Mismatch Volume: 93.30mL Infarction Location:RIGHT basal ganglia, and regional white matter. IMPRESSION: Acute RIGHT M1 MCA occlusion.  Fair to good collateralization. No extracranial stenosis/dissection or carotid siphon stenosis. Significant mismatch volume of 93 mL, encompassing much of the RIGHT MCA territory. See discussion above. These results were called by telephone at the time of interpretation on 02/10/2018 at 11:13 am to Dr. Francine Graven , who verbally acknowledged these results. Electronically Signed   By: Staci Righter M.D.   On: 02/10/2018 11:36   Mr Jodene Nam Head Wo Contrast  Result Date: 02/12/2018 CLINICAL DATA:  Stroke follow-up.  Right M1 occlusion. EXAM: MRI  HEAD WITHOUT CONTRAST MRA HEAD WITHOUT CONTRAST TECHNIQUE: Multiplanar, multiecho pulse sequences of the brain and surrounding structures were obtained without intravenous contrast. Angiographic images of the head were obtained using MRA technique without contrast. COMPARISON:  Head CT/CTA 02/10/2018 FINDINGS: MRI HEAD FINDINGS BRAIN: There is multifocal diffusion restriction throughout the right MCA territory, predominantly within the right basal ganglia and insula. There is mild mass effect on the frontal horn of the right lateral ventricle. The midline structures are normal. There is cytotoxic edema within the right MCA distribution according to the areas ischemia. There is petechial hemorrhage within the right basal ganglia. The CSF spaces are normal for age, with no hydrocephalus. Susceptibility-sensitive sequences show no chronic microhemorrhage or superficial siderosis. SKULL AND UPPER CERVICAL SPINE: The visualized skull base, calvarium, upper cervical spine and extracranial soft tissues are normal. SINUSES/ORBITS: No fluid levels or advanced mucosal thickening. No mastoid or middle ear effusion. The orbits are normal. MRA HEAD FINDINGS Intracranial internal carotid arteries: Normal. Anterior cerebral arteries: Normal. Middle cerebral arteries: The M1 segment of the right middle cerebral artery remains occluded. There is return of flow related enhancement within the proximal M2 branches. Left MCA is normal. Posterior communicating arteries: Not visualized Posterior cerebral arteries: Normal. Basilar artery: Normal. Vertebral arteries: Codominant.  Normal. Superior cerebellar arteries: Normal. Inferior cerebellar arteries: Normal. IMPRESSION: 1. Persistent occlusion of the right middle cerebral artery M1 segment. There is flow related enhancement within the right M2 branches. 2. Large area infarction within the right middle cerebral artery territory, predominantly involving the anterior insula and the basal  ganglia. 3. Petechial hemorrhage within the right basal ganglia. Heidelberg classification 1b: HI2, confluent petechiae, no mass effect. Electronically Signed   By: Ulyses Jarred M.D.   On: 02/12/2018 00:25   Ct Head Code Stroke Wo Contrast  Result Date: 02/10/2018 CLINICAL DATA:  Code stroke. LEFT-sided weakness. Patient drooling in bed. Last seen normal 0930 hours EXAM: CT HEAD WITHOUT CONTRAST TECHNIQUE: Contiguous axial images were obtained from the base of the skull through the vertex without intravenous contrast. COMPARISON:  CT  head 07/12/2017. MR head and MRA intracranial 07/13/2017. CT angiography and CT perfusion reported separately. FINDINGS: Brain: Well-demarcated areas of cortical hypodensity affecting the RIGHT temporal lobe, and RIGHT posterior frontal lobe, most consistent with subacute infarction. Ill-defined areas of low-attenuation in the RIGHT frontal cortex, RIGHT insula, and suspected RIGHT basal ganglia consistent with acute infarction. No hemorrhage, mass lesion, hydrocephalus, or extra-axial fluid. Chronic LEFT hemisphere infarcts. Vascular: Hyperdense M1 MCA Skull: Normal. Negative for fracture or focal lesion. Sinuses/Orbits: No acute finding. Other: None ASPECTS ([place] Stroke Program Early CT Score) - Ganglionic level infarction (caudate, lentiform nuclei, internal capsule, insula, M1-M3 cortex): 4 - Supraganglionic infarction (M4-M6 cortex): 3 Total score (0-10 with 10 being normal): 7 IMPRESSION: 1. Mixed pattern of both acute and suspected subacute RIGHT hemisphere ischemia, in the setting of hyperdense RIGHT M1 MCA and acute LEFT-sided weakness, consistent with emergent large vessel occlusion of the RIGHT proximal MCA. No hemorrhage. 2. ASPECTS is 7. These results were called by telephone at the time of interpretation on 02/10/2018 at 11:24 am to Dr. Francine Graven , who verbally acknowledged these results. Electronically Signed   By: Staci Righter M.D.   On: 02/10/2018 11:25    Vas [place] Lower Extremity Venous (dvt)  Result Date: 02/12/2018  Lower Venous Study Indications: Embolic stroke.  Limitations: Body habitus, bandages and immobility. Performing Technologist: Lorina Rabon  Examination Guidelines: A complete evaluation includes B-mode imaging, spectral Doppler, color Doppler, and power Doppler as needed of all accessible portions of each vessel. Bilateral testing is considered an integral part of a complete examination. Limited examinations for reoccurring indications may be performed as noted.  Right Venous Findings: +---------+---------------+---------+-----------+----------+-------+          CompressibilityPhasicitySpontaneityPropertiesSummary +---------+---------------+---------+-----------+----------+-------+ CFV      Full           Yes      Yes                          +---------+---------------+---------+-----------+----------+-------+ SFJ      Full                                                 +---------+---------------+---------+-----------+----------+-------+ FV Prox  Full                                                 +---------+---------------+---------+-----------+----------+-------+ FV Mid   Full                                                 +---------+---------------+---------+-----------+----------+-------+ FV DistalFull                                                 +---------+---------------+---------+-----------+----------+-------+ PFV      Full                                                 +---------+---------------+---------+-----------+----------+-------+  POP      Full           Yes      Yes                          +---------+---------------+---------+-----------+----------+-------+ PTV      Full                                                 +---------+---------------+---------+-----------+----------+-------+ PERO     Full                                                  +---------+---------------+---------+-----------+----------+-------+ A cystic structure with non-vascular feature seen at right popliteal fossa medial, measuring 2.54x3.24x0.85cm.  Left Venous Findings: +---------+---------------+---------+-----------+----------+-------+          CompressibilityPhasicitySpontaneityPropertiesSummary +---------+---------------+---------+-----------+----------+-------+ CFV      Full           Yes      Yes                          +---------+---------------+---------+-----------+----------+-------+ SFJ      Full                                                 +---------+---------------+---------+-----------+----------+-------+ FV Prox  Full                                                 +---------+---------------+---------+-----------+----------+-------+ FV Mid   Full                                                 +---------+---------------+---------+-----------+----------+-------+ FV DistalFull                                                 +---------+---------------+---------+-----------+----------+-------+ PFV      Full                                                 +---------+---------------+---------+-----------+----------+-------+ POP      Full           Yes      Yes                          +---------+---------------+---------+-----------+----------+-------+ PTV      Full                                                 +---------+---------------+---------+-----------+----------+-------+  PERO     Full                                                 +---------+---------------+---------+-----------+----------+-------+ A cystic structure with internal echoes, non-vascular feature seen at left popliteal fossa medial, measuring 1.36x2.09x0.98cm.    Summary: Right: There is no evidence of deep vein thrombosis in the lower extremity. Left: There is no evidence of deep vein thrombosis in the lower extremity.  *See  table(s) above for measurements and observations. Electronically signed by Ruta Hinds MD on 02/12/2018 at 9:14:26 AM.    Final     Labs:  CBC: Recent Labs    02/10/18 1036 02/10/18 1044 02/11/18 0447 02/12/18 1538 02/13/18 0436  WBC 4.8  --  7.6 7.8 7.9  HGB 11.3* 11.6* 9.2* 8.8* 8.7*  HCT 36.9 34.0* 30.5* 27.8* 28.6*  PLT 183  --  159 144* 131*    COAGS: Recent Labs    06/03/17 1421 07/12/17 1759 02/10/18 1036  INR 1.23 1.07 1.17  APTT  --  27 32    BMP: Recent Labs    02/10/18 1036 02/10/18 1044 02/11/18 0447 02/12/18 1538 02/13/18 0436  NA 144 146* 142 141 143  K 3.7 3.7 3.8 3.5 3.5  CL 110 106 113* 112* 114*  CO2 27  --  22 24 23   GLUCOSE 89 88 99 82 80  BUN 13 12 12 15 13   CALCIUM 8.8*  --  8.4* 8.5* 8.4*  CREATININE 1.05* 1.10* 1.31* 1.05* 1.07*  GFRNONAA 51*  --  39* 51* 49*  GFRAA 59*  --  45* 59* 57*    LIVER FUNCTION TESTS: Recent Labs    07/12/17 1759 07/13/17 0445 09/14/17 0215 02/10/18 1036  BILITOT 0.9 0.8 0.5 0.8  AST 24 17 13* 18  ALT 12* 10* 8* 14  ALKPHOS 56 52 54 54  PROT 6.1* 5.6* 6.3* 5.9*  ALBUMIN 3.4* 3.1* 3.4* 3.5    Assessment and Plan:  Right MCA M1 segment occlusion s/p revascularization using emergent mechanical thrombectomy 02/10/2018 by Dr. Estanislado Pandy. Patient's condition stable- still no spontaneous movement of left side, still with dysarthric speech/left facial droop. Continue taking Plavix 75 mg once daily and Aspirin 325 mg once daily. Plan to follow-up with Dr. Estanislado Pandy in clinic 6 weeks after discharge. Appreciate and agree with neurology management. Please call IR with questions/concerns.   Electronically Signed: Earley Abide, PA-C 02/13/2018, 10:38 AM   I spent a total of 15 Minutes at the the patient's bedside AND on the patient's hospital floor or unit, greater than 50% of which was counseling/coordinating care for right MCA M1 segment occlusion s/p revascularization.

## 2018-02-13 NOTE — Progress Notes (Signed)
Physical Therapy Treatment Patient Details Name: Brandy [name] MRN: [id number] DOB: 12-Oct-1940 Today's Date: 02/13/2018    History of Present Illness 77 yo admitted with Left weakness and right gaze with Rt MCA CVA.  Mechanical thrombectomy attempted but failed- minimal recanalizatiobn of occluded RT MCA M1 seg. PMH significant for cardiomyopathy, TIA, HTN, CHF, Lt MCA stroke ( April 2019), and tobacco abuse     PT Comments    Pt with continued lack of movement of left side. Pt not accurately sensation on entire left side with testing and still remains unable to control saliva with sitting. Pt educated for left inattention, sensation and strength deficits and need for 2 person assist with mobility and functional activity. In chair pt dropping LUE off armrest and unaware with trunk and UE supported end of session. Spouse, brother and daughter all present throughout session.     Follow Up Recommendations  CIR;Supervision/Assistance - 24 hour     Equipment Recommendations  Wheelchair (measurements PT);3in1 (PT)    Recommendations for Other Services       Precautions / Restrictions Precautions Precautions: Fall Precaution Comments: mild Lt inattention, left hemiplegia Restrictions Weight Bearing Restrictions: No    Mobility  Bed Mobility Overal bed mobility: Needs Assistance Bed Mobility: Rolling;Sidelying to Sit Rolling: Min assist Sidelying to sit: Max assist       General bed mobility comments: pt able to bend RLE and reach for rail to roll to left. Max assist to bring legs off of bed and elevate trunk from surface  Transfers Overall transfer level: Needs assistance   Transfers: Sit to/from Stand;Stand Pivot Transfers Sit to Stand: Max assist;+2 physical assistance Stand pivot transfers: Max assist;+2 physical assistance       General transfer comment: left knee blocked with support of LUE and assist to rise from bed and chair. PT with flexed trunk in standing and  cues to extend bil LE with max reliance on blocking of LLE to prevent buckling. Max +2 assist to pivot to right  Ambulation/Gait             General Gait Details: unable   Stairs             Wheelchair Mobility    Modified Rankin (Stroke Patients Only) Modified Rankin (Stroke Patients Only) Pre-Morbid Rankin Score: No symptoms Modified Rankin: Severe disability     Balance Overall balance assessment: Needs assistance Sitting-balance support: Single extremity supported;Feet supported Sitting balance-Leahy Scale: Poor Sitting balance - Comments: mod-max assist EOB working on trunk extension, midline posture, reaching and propping with RUE Postural control: Posterior lean;Left lateral lean   Standing balance-Leahy Scale: Zero                              Cognition Arousal/Alertness: Awake/alert Behavior During Therapy: Flat affect Overall Cognitive Status: Impaired/Different from baseline Area of Impairment: Attention;Awareness;Problem solving                   Current Attention Level: Selective     Safety/Judgement: Decreased awareness of deficits   Problem Solving: Decreased initiation;Difficulty sequencing;Requires verbal cues General Comments: left inattention      Exercises General Exercises - Lower Extremity Long Arc Quad: AROM;15 reps;Seated;Right Hip Flexion/Marching: AROM;15 reps;Seated;Right    General Comments        Pertinent Vitals/Pain Pain Assessment: No/denies pain    Home Living  Prior Function            PT Goals (current goals can now be found in the care plan section) Progress towards PT goals: Progressing toward goals    Frequency           PT Plan Current plan remains appropriate    Co-evaluation              AM-PAC PT "6 Clicks" Daily Activity  Outcome Measure  Difficulty turning over in bed (including adjusting bedclothes, sheets and blankets)?:  Unable Difficulty moving from lying on back to sitting on the side of the bed? : Unable Difficulty sitting down on and standing up from a chair with arms (e.g., wheelchair, bedside commode, etc,.)?: Unable Help needed moving to and from a bed to chair (including a wheelchair)?: Total Help needed walking in hospital room?: Total Help needed climbing 3-5 steps with a railing? : Total 6 Click Score: 6    End of Session Equipment Utilized During Treatment: Gait belt Activity Tolerance: Patient tolerated treatment well Patient left: in chair;with call [place]/phone within reach;with chair alarm set;with family/visitor present Nurse Communication: Mobility status;Need for lift equipment;Precautions PT Visit Diagnosis: Other abnormalities of gait and mobility (R26.89);Other symptoms and signs involving the nervous system (R29.898);Hemiplegia and hemiparesis Hemiplegia - caused by: Cerebral infarction     Time: 0940-7680 PT Time Calculation (min) (ACUTE ONLY): 24 min  Charges:  $Therapeutic Activity: 8-22 mins $Neuromuscular Re-education: 8-22 mins                     Castle Shannon, PT Acute Rehabilitation Services Pager: [phone] Office: [phone]    Kathren Scearce B Johncharles Fusselman 02/13/2018, 10:58 AM

## 2018-02-13 NOTE — CV Procedure (Signed)
    PROCEDURE NOTE:  Procedure:  Transesophageal echocardiogram Operator:  Fransico Him, MD Indications:  CVA Complications: None  During this procedure the patient is administered a total of Versed 1 mg to achieve and maintain moderate conscious sedation.  The patient's heart rate, blood pressure, and oxygen saturation are monitored continuously during the procedure. The period of conscious sedation is 15 minutes, of which I was present face-to-face 100% of this time.  Results: Moderately dilated  LV size and mildly reduced LV function with EF 40% Normal RV size and function Normal RA Moderately dilated  LA.  The LA appendage is normal in size.  Cannot rule out early forming thrombus in the LA appendage vs. Artifact from posterior wall of appendage. Normal TV with mild TR Normal PV with trivial PR Normal MV with trivial MR Normal trileaflet AV Normal interatrial septum with no evidence of shunt by colorflow dopper or agitated saline contrast injection Mild atherosclerosis of the thoracic and ascending aorta.  The patient had multiple runs of nonsustained atrial tachycardia and PACs during the procedure.  The patient tolerated the procedure well and was transferred back to their room in stable condition.  Signed: Fransico Him, MD [facility]

## 2018-02-13 NOTE — Progress Notes (Signed)
Inpatient Rehabilitation-Admissions Coordinator    Met with patient and her brother and husband at the bedside to discuss team's recommendation for inpatient rehabilitation. Shared booklets, expectations while in CIR, expected length of stay, and anticipated functional level at [facility]. [facility] discussed need for caregiver support at DC and explained extent of anticipated support, even after rehab. Both her brother and husband were going to take some time to figure out support at DC. AC to follow up tomorrow with family for support plans.   Please call if questions.   Jhonnie Garner, OTR/L  Rehab Admissions Coordinator  [phone] 02/13/2018 5:32 PM

## 2018-02-13 NOTE — Progress Notes (Signed)
STROKE TEAM PROGRESS NOTE   SUBJECTIVE (INTERVAL HISTORY) Her brother and husband are at the bedside.  Patient is awake alert, sitting in chair, still has left facial droop and left hemiplegia.  TEE done today showed EF 40%, cannot rule out early forming thrombus in the LAA versus artifact from posterior wall of the appendage, and patient was noted to have multiple runs of nonsustained A. tach and PACs during the procedure.   OBJECTIVE Temp:  [98.7 F (37.1 C)-100.5 F (38.1 C)] 100.5 F (38.1 C) (11/18 2000) Pulse Rate:  [58-106] 82 (11/18 2035) Cardiac Rhythm: Normal sinus rhythm (11/18 2000) Resp:  [11-28] 25 (11/18 2035) BP: (139-186)/(53-80) 139/56 (11/18 2035) SpO2:  [95 %-100 %] 96 % (11/18 2035) Weight:  [68.5 kg] 68.5 kg (11/18 1404)  Recent Labs  Lab 02/10/18 1041  GLUCAP 75   Recent Labs  Lab 02/10/18 1036 02/10/18 1044 02/11/18 0447 02/12/18 1538 02/13/18 0436  NA 144 146* 142 141 143  K 3.7 3.7 3.8 3.5 3.5  CL 110 106 113* 112* 114*  CO2 27  --  22 24 23   GLUCOSE 89 88 99 82 80  BUN 13 12 12 15 13   CREATININE 1.05* 1.10* 1.31* 1.05* 1.07*  CALCIUM 8.8*  --  8.4* 8.5* 8.4*   Recent Labs  Lab 02/10/18 1036  AST 18  ALT 14  ALKPHOS 54  BILITOT 0.8  PROT 5.9*  ALBUMIN 3.5   Recent Labs  Lab 02/10/18 1036 02/10/18 1044 02/11/18 0447 02/12/18 1538 02/13/18 0436  WBC 4.8  --  7.6 7.8 7.9  NEUTROABS 2.8  --  6.8  --   --   HGB 11.3* 11.6* 9.2* 8.8* 8.7*  HCT 36.9 34.0* 30.5* 27.8* 28.6*  MCV 91.6  --  91.3 90.6 91.1  PLT 183  --  159 144* 131*   No results for input(s): CKTOTAL, CKMB, CKMBINDEX, TROPONINI in the last 168 hours. No results for input(s): LABPROT, INR in the last 72 hours. No results for input(s): COLORURINE, LABSPEC, [place], GLUCOSEU, HGBUR, BILIRUBINUR, KETONESUR, PROTEINUR, UROBILINOGEN, NITRITE, LEUKOCYTESUR in the last 72 hours.  Invalid input(s): APPERANCEUR     Component Value Date/Time   CHOL 93 02/11/2018 0447   TRIG  42 02/11/2018 0447   HDL 33 (L) 02/11/2018 0447   CHOLHDL 2.8 02/11/2018 0447   VLDL 8 02/11/2018 0447   LDLCALC 52 02/11/2018 0447   Lab Results  Component Value Date   HGBA1C 4.7 (L) 02/11/2018      Component Value Date/Time   LABOPIA NONE DETECTED 02/10/2018 2000   COCAINSCRNUR NONE DETECTED 02/10/2018 2000   LABBENZ NONE DETECTED 02/10/2018 2000   AMPHETMU NONE DETECTED 02/10/2018 2000   THCU NONE DETECTED 02/10/2018 2000   LABBARB NONE DETECTED 02/10/2018 2000    Recent Labs  Lab 02/10/18 1036  ETH <10    IMAGING  I have personally reviewed the radiological images below and agree with the radiology interpretations.  Ct Angio Head W Or Wo Contrast Ct Angio Neck W Or Wo Contrast Ct Cerebral Perfusion W Contrast 02/10/2018 IMPRESSION:  Acute RIGHT M1 MCA occlusion.  Fair to good collateralization. No extracranial stenosis/dissection or carotid siphon stenosis. Significant mismatch volume of 93 mL, encompassing much of the RIGHT MCA territory.   Ct Head Code Stroke Wo Contrast 02/10/2018 IMPRESSION:  1. Mixed pattern of both acute and suspected subacute RIGHT hemisphere ischemia, in the setting of hyperdense RIGHT M1 MCA and acute LEFT-sided weakness, consistent with emergent large vessel occlusion  of the RIGHT proximal MCA. No hemorrhage.  2. ASPECTS is 7.    Cerebral Angiogram - IR - Dr Estanislado Pandy 02/10/2018 S/P RT common carotid arteriogram,followed by x passes with embotrap 3mm x 24mm retriver device with minimal recanalizatiobn of occluded RT MCA M1 seg.  Focal  Neovascularity at site of occlusion ?moya moya like phenomenon due to progressive severe underlying ASVD stenosis.   Transthoracic Echocardiogram  Study Conclusions  - Left ventricle: The cavity size was moderately dilated. Wall   thickness was increased in a pattern of moderate LVH. The   estimated ejection fraction was 40%. Diffuse hypokinesis. Doppler   parameters are consistent with both  elevated ventricular   end-diastolic filling pressure and elevated left atrial filling   pressure. - Mitral valve: There was mild regurgitation. - Left atrium: The atrium was moderately dilated. - Atrial septum: No defect or patent foramen ovale was identified. - Pulmonary arteries: PA peak pressure: 41 mm Hg (S). - Pericardium, extracardiac: A trivial pericardial effusion was   identified. Impressions: - No cardiac source of emboli was indentified.   Mr Brain Wo Contrast  Result Date: 02/12/2018 CLINICAL DATA:  Stroke follow-up.  Right M1 occlusion. EXAM: MRI HEAD WITHOUT CONTRAST MRA HEAD WITHOUT CONTRAST TECHNIQUE: Multiplanar, multiecho pulse sequences of the brain and surrounding structures were obtained without intravenous contrast. Angiographic images of the head were obtained using MRA technique without contrast. COMPARISON:  Head CT/CTA 02/10/2018 FINDINGS: MRI HEAD FINDINGS BRAIN: There is multifocal diffusion restriction throughout the right MCA territory, predominantly within the right basal ganglia and insula. There is mild mass effect on the frontal horn of the right lateral ventricle. The midline structures are normal. There is cytotoxic edema within the right MCA distribution according to the areas ischemia. There is petechial hemorrhage within the right basal ganglia. The CSF spaces are normal for age, with no hydrocephalus. Susceptibility-sensitive sequences show no chronic microhemorrhage or superficial siderosis. SKULL AND UPPER CERVICAL SPINE: The visualized skull base, calvarium, upper cervical spine and extracranial soft tissues are normal. SINUSES/ORBITS: No fluid levels or advanced mucosal thickening. No mastoid or middle ear effusion. The orbits are normal. MRA HEAD FINDINGS Intracranial internal carotid arteries: Normal. Anterior cerebral arteries: Normal. Middle cerebral arteries: The M1 segment of the right middle cerebral artery remains occluded. There is return of flow  related enhancement within the proximal M2 branches. Left MCA is normal. Posterior communicating arteries: Not visualized Posterior cerebral arteries: Normal. Basilar artery: Normal. Vertebral arteries: Codominant.  Normal. Superior cerebellar arteries: Normal. Inferior cerebellar arteries: Normal. IMPRESSION: 1. Persistent occlusion of the right middle cerebral artery M1 segment. There is flow related enhancement within the right M2 branches. 2. Large area infarction within the right middle cerebral artery territory, predominantly involving the anterior insula and the basal ganglia. 3. Petechial hemorrhage within the right basal ganglia. Heidelberg classification 1b: HI2, confluent petechiae, no mass effect. Electronically Signed   By: Ulyses Jarred M.D.   On: 02/12/2018 00:25   Mr Jodene Nam Head Wo Contrast  Result Date: 02/12/2018 CLINICAL DATA:  Stroke follow-up.  Right M1 occlusion. EXAM: MRI HEAD WITHOUT CONTRAST MRA HEAD WITHOUT CONTRAST TECHNIQUE: Multiplanar, multiecho pulse sequences of the brain and surrounding structures were obtained without intravenous contrast. Angiographic images of the head were obtained using MRA technique without contrast. COMPARISON:  Head CT/CTA 02/10/2018 FINDINGS: MRI HEAD FINDINGS BRAIN: There is multifocal diffusion restriction throughout the right MCA territory, predominantly within the right basal ganglia and insula. There is mild mass effect on the  frontal horn of the right lateral ventricle. The midline structures are normal. There is cytotoxic edema within the right MCA distribution according to the areas ischemia. There is petechial hemorrhage within the right basal ganglia. The CSF spaces are normal for age, with no hydrocephalus. Susceptibility-sensitive sequences show no chronic microhemorrhage or superficial siderosis. SKULL AND UPPER CERVICAL SPINE: The visualized skull base, calvarium, upper cervical spine and extracranial soft tissues are normal. SINUSES/ORBITS:  No fluid levels or advanced mucosal thickening. No mastoid or middle ear effusion. The orbits are normal. MRA HEAD FINDINGS Intracranial internal carotid arteries: Normal. Anterior cerebral arteries: Normal. Middle cerebral arteries: The M1 segment of the right middle cerebral artery remains occluded. There is return of flow related enhancement within the proximal M2 branches. Left MCA is normal. Posterior communicating arteries: Not visualized Posterior cerebral arteries: Normal. Basilar artery: Normal. Vertebral arteries: Codominant.  Normal. Superior cerebellar arteries: Normal. Inferior cerebellar arteries: Normal. IMPRESSION: 1. Persistent occlusion of the right middle cerebral artery M1 segment. There is flow related enhancement within the right M2 branches. 2. Large area infarction within the right middle cerebral artery territory, predominantly involving the anterior insula and the basal ganglia. 3. Petechial hemorrhage within the right basal ganglia. Heidelberg classification 1b: HI2, confluent petechiae, no mass effect. Electronically Signed   By: Ulyses Jarred M.D.   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  The LA appendage is normal in size.  Cannot rule out early forming thrombus in the LA appendage vs. Artifact from posterior wall of appendage. Normal TV with mild TR Normal PV with trivial PR Normal MV with trivial MR Normal trileaflet AV Normal interatrial septum with no evidence of shunt by colorflow dopper or agitated saline contrast injection Mild atherosclerosis of the thoracic and ascending aorta. The patient had multiple runs of nonsustained atrial tachycardia and PACs during the procedure.   PHYSICAL EXAM  Temp:  [98.7 F (37.1 C)-100.5 F (38.1 C)] 100.5 F (38.1 C) (11/18  2000) Pulse Rate:  [58-106] 82 (11/18 2035) Resp:  [11-28] 25 (11/18 2035) BP: (139-186)/(53-80) 139/56 (11/18 2035) SpO2:  [95 %-100 %] 96 % (11/18 2035) Weight:  [68.5 kg] 68.5 kg (11/18 1404)  General - Well nourished, well developed, awake, alert, not in distress  Ophthalmologic - fundi not visualized due to noncooperation.  Cardiovascular - Regular rate and rhythm.  Neuro - awake, alert, eyes open, right gaze preference but able to cross midline, nystagmus resolved with left gaze. Orientated to place, people and month but not the year. Paucity of speech, able to name 2/2 and repeat but moderate dysarthria. PERRL, full field full but with left simultagnosia. Left facial droop and tongue protrusion to the left. Left UE 0/5 and left LE trace withdraw with pain. RUE and RLE 4+/5. Left babinski positive. Sensation symmetrical, coordination not cooperative. Gait not tested.    ASSESSMENT/PLAN Ms. VANETA HAMMONTREE is a 77 y.o. female with history of tobacco use, previous TIA, hypertension, cardiomyopathy with an EF of 25%, and congestive heart failure, admitted to [facility] with left-sided weakness and right gaze preference. No tPA given due to late presentation. Mechanical thrombectomy attempted but failed- minimal recanalizatiobn of occluded RT MCA M1 seg.   Stroke: R MCA infarct with right M1 occlusion failed IR, likely due to  cardioembolic source  Resultant right gaze and the left hemiplegia  MRI - large right MCA infarcts including BG, CR and punctate cortical infarcts  MRA  - right M1 occlusion with some flow in right M2 branches  Head CT - right MCA infarcts with right M1 hyperdense signal  CTA H&N - Acute RIGHT M1 MCA occlusion, but previous left M2 occlusion resolved  CTP - Significant mismatch volume of 93 mL, encompassing much of the RIGHT MCA territory.   IR attempted but failed to recanalize right M1  2D Echo - EF 40%  TEE - EF 40%, cannot rule out early  forming thrombus in the LAA versus artifact from posterior wall of the appendage, multiple runs of nonsustained A. tach and PACs were noted during the procedure  30-day cardiac event monitoring versus loop recorder as per EP  LDL - 52  HgbA1c - 4.7  UDS - negative  Lovenox for VTE prophylaxis  aspirin 81 mg daily prior to admission, now on aspirin 325 mg daily and clopidogrel 75 mg daily.   Patient counseled to be compliant with her antithrombotic medications  Ongoing aggressive stroke risk factor management  Therapy recommendations: CIR  Disposition: pending  History of stroke  06/2017 admitted in [place], left MCA infarct with left M2 occlusion and right M1 distal high-grade stenosis, EF 40%.  Put on aspirin 162 and a statin on discharge  This admission MRA showed right M1 occlusion but left M2 is patent now - indicating the embolic pattern  Cardiomyopathy  EF 35 to 40% in 05/2017  EF 40% in 06/2017  TTE and TEE EF 40% this admission  Has been followed with cardiology and on bidil and coreg PTA  Coreg resumed  Not resumed bidil yet due to BP fluctuation.  Hypertension . Stable  . Resumed coreg, but not bidil yet.   Long term BP goal 130-150 given large vessel occlusion  Hyperlipidemia  Home meds:  Lipitor 10 mg daily  LDL 52, goal < 70  Resume Lipitor  Continue statin at discharge  Other Stroke Risk Factors  Advanced age  Former cigarette smoker - quit  CAD  Other Active Problems  Post procedure anemia hemoglobin 11.6-9.2-> pending  Elevated Creatinine -1.10-1.31-> pending  COPD  Low grade fever 100.5 - pending UA and CXR  Hospital day # 3  This patient is critically ill due to large right MCA stroke, history of stroke, right M1 occlusion, cardiomyopathy, fever, CHF and at significant risk of neurological worsening, death form recurrent stroke, hemorrhagic conversion, seizure, heart failure. This patient's care requires constant monitoring  of vital signs, hemodynamics, respiratory and cardiac monitoring, review of multiple databases, neurological assessment, discussion with family, other specialists and medical decision making of high complexity. I spent 35 minutes of neurocritical care time in the care of this patient. I had long discussion with patient and her brother at bedside, updated pt current condition, treatment plan and potential prognosis. They expressed understanding and appreciation.   Rosalin Hawking, MD PhD Stroke Neurology 02/13/2018 11:41 PM      To contact Stroke Continuity provider, please refer to [email or web address] After hours, contact General Neurology

## 2018-02-13 NOTE — Anesthesia Postprocedure Evaluation (Signed)
Anesthesia Post Note  Patient: Brandy [name]  Procedure(s) Performed: RADIOLOGY WITH ANESTHESIA (N/A )     Patient location during evaluation: PACU Anesthesia Type: General Level of consciousness: awake Pain management: pain level controlled Vital Signs Assessment: post-procedure vital signs reviewed and stable Respiratory status: spontaneous breathing, nonlabored ventilation, respiratory function stable and patient connected to nasal cannula oxygen Cardiovascular status: blood pressure returned to baseline and stable Postop Assessment: no apparent nausea or vomiting Anesthetic complications: no    Last Vitals:  Vitals:   02/13/18 0050 02/13/18 0400  BP: (!) 152/60 140/74  Pulse: 84 (!) 59  Resp: (!) 22 14  Temp:  37.5 C  SpO2: 100% 97%    Last Pain:  Vitals:   02/13/18 0400  TempSrc: Oral  PainSc: 0-No pain                 Audry Pili

## 2018-02-14 ENCOUNTER — Inpatient Hospital Stay (HOSPITAL_COMMUNITY): Payer: Medicare Other

## 2018-02-14 ENCOUNTER — Encounter (HOSPITAL_COMMUNITY): Payer: Self-pay | Admitting: Interventional Radiology

## 2018-02-14 DIAGNOSIS — I471 Supraventricular tachycardia: Secondary | ICD-10-CM

## 2018-02-14 LAB — BASIC METABOLIC PANEL
Anion gap: 8 (ref 5–15)
BUN: 12 mg/dL (ref 8–23)
CALCIUM: 8.4 mg/dL — AB (ref 8.9–10.3)
CHLORIDE: 110 mmol/L (ref 98–111)
CO2: 23 mmol/L (ref 22–32)
CREATININE: 1.14 mg/dL — AB (ref 0.44–1.00)
GFR calc non Af Amer: 46 mL/min — ABNORMAL LOW (ref >60)
GFR, EST AFRICAN AMERICAN: 53 mL/min — AB (ref >60)
Glucose, Bld: 83 mg/dL (ref 70–99)
Potassium: 3.4 mmol/L — ABNORMAL LOW (ref 3.5–5.1)
Sodium: 141 mmol/L (ref 135–145)

## 2018-02-14 LAB — URINALYSIS, COMPLETE (UACMP) WITH MICROSCOPIC
BILIRUBIN URINE: NEGATIVE
Bacteria, UA: NONE SEEN
Glucose, UA: NEGATIVE mg/dL
HGB URINE DIPSTICK: NEGATIVE
Ketones, ur: 80 mg/dL — AB
Leukocytes, UA: NEGATIVE
NITRITE: NEGATIVE
PH: 5 (ref 5.0–8.0)
Protein, ur: NEGATIVE mg/dL
SPECIFIC GRAVITY, URINE: 1.019 (ref 1.005–1.030)

## 2018-02-14 LAB — CBC
HCT: 29.3 % — ABNORMAL LOW (ref 36.0–46.0)
HEMOGLOBIN: 8.9 g/dL — AB (ref 12.0–15.0)
MCH: 27.6 pg (ref 26.0–34.0)
MCHC: 30.4 g/dL (ref 30.0–36.0)
MCV: 90.7 fL (ref 80.0–100.0)
Platelets: 125 10*3/uL — ABNORMAL LOW (ref 150–400)
RBC: 3.23 MIL/uL — ABNORMAL LOW (ref 3.87–5.11)
RDW: 14.5 % (ref 11.5–15.5)
WBC: 9.6 10*3/uL (ref 4.0–10.5)
nRBC: 0 % (ref 0.0–0.2)

## 2018-02-14 MED ORDER — BISACODYL 10 MG RE SUPP
10.0000 mg | Freq: Once | RECTAL | Status: AC
  Administered 2018-02-14: 10 mg via RECTAL
  Filled 2018-02-14: qty 1

## 2018-02-14 MED ORDER — POTASSIUM CHLORIDE CRYS ER 20 MEQ PO TBCR: 20.0000 meq | EXTENDED_RELEASE_TABLET | Freq: Once | ORAL | Status: DC

## 2018-02-14 MED ORDER — ASPIRIN 300 MG RE SUPP
300.0000 mg | Freq: Once | RECTAL | Status: AC
  Administered 2018-02-14: 300 mg via RECTAL
  Filled 2018-02-14: qty 1

## 2018-02-14 MED ORDER — SODIUM CHLORIDE 0.9 % IV SOLN
INTRAVENOUS | Status: DC
  Administered 2018-02-14 – 2018-02-15 (×3): via INTRAVENOUS

## 2018-02-14 MED ORDER — DONEPEZIL HCL 5 MG PO TABS
5.0000 mg | ORAL_TABLET | Freq: Every day | ORAL | Status: DC
  Administered 2018-02-14 – 2018-02-15 (×2): 5 mg via ORAL
  Filled 2018-02-14 (×2): qty 1

## 2018-02-14 NOTE — Progress Notes (Addendum)
STROKE TEAM PROGRESS NOTE   SUBJECTIVE (INTERVAL HISTORY) Patient in bed sleeping, NAD. Drooling. Family at bedside. Febrile overnight.cxr:atelectasis, infilitrates. UA pending, nurse to i/o cath. Plan to transfer to the floor when bed available.   OBJECTIVE Temp:  [98.2 F (36.8 C)-100.5 F (38.1 C)] 100.3 F (37.9 C) (11/19 0800) Pulse Rate:  [58-106] 79 (11/19 0800) Cardiac Rhythm: Normal sinus rhythm (11/18 2000) Resp:  [11-28] 20 (11/19 0800) BP: (123-186)/(53-84) 123/67 (11/19 0800) SpO2:  [96 %-100 %] 98 % (11/19 0800) Weight:  [68.5 kg] 68.5 kg (11/18 1404)  Recent Labs  Lab 02/10/18 1041  GLUCAP 75   Recent Labs  Lab 02/10/18 1036 02/10/18 1044 02/11/18 0447 02/12/18 1538 02/13/18 0436 02/14/18 0612  NA 144 146* 142 141 143 141  K 3.7 3.7 3.8 3.5 3.5 3.4*  CL 110 106 113* 112* 114* 110  CO2 27  --  22 24 23 23   GLUCOSE 89 88 99 82 80 83  BUN 13 12 12 15 13 12   CREATININE 1.05* 1.10* 1.31* 1.05* 1.07* 1.14*  CALCIUM 8.8*  --  8.4* 8.5* 8.4* 8.4*   Recent Labs  Lab 02/10/18 1036  AST 18  ALT 14  ALKPHOS 54  BILITOT 0.8  PROT 5.9*  ALBUMIN 3.5   Recent Labs  Lab 02/10/18 1036 02/10/18 1044 02/11/18 0447 02/12/18 1538 02/13/18 0436 02/14/18 0612  WBC 4.8  --  7.6 7.8 7.9 9.6  NEUTROABS 2.8  --  6.8  --   --   --   HGB 11.3* 11.6* 9.2* 8.8* 8.7* 8.9*  HCT 36.9 34.0* 30.5* 27.8* 28.6* 29.3*  MCV 91.6  --  91.3 90.6 91.1 90.7  PLT 183  --  159 144* 131* 125*   No results for input(s): CKTOTAL, CKMB, CKMBINDEX, TROPONINI in the last 168 hours. No results for input(s): LABPROT, INR in the last 72 hours. No results for input(s): COLORURINE, LABSPEC, [place], GLUCOSEU, HGBUR, BILIRUBINUR, KETONESUR, PROTEINUR, UROBILINOGEN, NITRITE, LEUKOCYTESUR in the last 72 hours.  Invalid input(s): APPERANCEUR     Component Value Date/Time   CHOL 93 02/11/2018 0447   TRIG 42 02/11/2018 0447   HDL 33 (L) 02/11/2018 0447   CHOLHDL 2.8 02/11/2018 0447   VLDL  8 02/11/2018 0447   LDLCALC 52 02/11/2018 0447   Lab Results  Component Value Date   HGBA1C 4.7 (L) 02/11/2018      Component Value Date/Time   LABOPIA NONE DETECTED 02/10/2018 2000   COCAINSCRNUR NONE DETECTED 02/10/2018 2000   LABBENZ NONE DETECTED 02/10/2018 2000   AMPHETMU NONE DETECTED 02/10/2018 2000   THCU NONE DETECTED 02/10/2018 2000   LABBARB NONE DETECTED 02/10/2018 2000    Recent Labs  Lab 02/10/18 1036  ETH <10    IMAGING  I have personally reviewed the radiological images below and agree with the radiology interpretations.  Ct Angio Head W Or Wo Contrast Ct Angio Neck W Or Wo Contrast Ct Cerebral Perfusion W Contrast 02/10/2018 IMPRESSION:  Acute RIGHT M1 MCA occlusion.  Fair to good collateralization. No extracranial stenosis/dissection or carotid siphon stenosis. Significant mismatch volume of 93 mL, encompassing much of the RIGHT MCA territory.   Ct Head Code Stroke Wo Contrast 02/10/2018 IMPRESSION:  1. Mixed pattern of both acute and suspected subacute RIGHT hemisphere ischemia, in the setting of hyperdense RIGHT M1 MCA and acute LEFT-sided weakness, consistent with emergent large vessel occlusion of the RIGHT proximal MCA. No hemorrhage.  2. ASPECTS is 7.    Cerebral Angiogram -  IR - Dr Estanislado Pandy 02/10/2018 S/P RT common carotid arteriogram,followed by x passes with embotrap 62mm x 57mm retriver device with minimal recanalizatiobn of occluded RT MCA M1 seg.  Focal  Neovascularity at site of occlusion ?moya moya like phenomenon due to progressive severe underlying ASVD stenosis.   Transthoracic Echocardiogram  Study Conclusions  - Left ventricle: The cavity size was moderately dilated. Wall   thickness was increased in a pattern of moderate LVH. The   estimated ejection fraction was 40%. Diffuse hypokinesis. Doppler   parameters are consistent with both elevated ventricular   end-diastolic filling pressure and elevated left atrial filling    pressure. - Mitral valve: There was mild regurgitation. - Left atrium: The atrium was moderately dilated. - Atrial septum: No defect or patent foramen ovale was identified. - Pulmonary arteries: PA peak pressure: 41 mm Hg (S). - Pericardium, extracardiac: A trivial pericardial effusion was   identified. Impressions: - No cardiac source of emboli was indentified.   Mr Brain Wo Contrast  Result Date: 02/12/2018 CLINICAL DATA:  Stroke follow-up.  Right M1 occlusion. EXAM: MRI HEAD WITHOUT CONTRAST MRA HEAD WITHOUT CONTRAST TECHNIQUE: Multiplanar, multiecho pulse sequences of the brain and surrounding structures were obtained without intravenous contrast. Angiographic images of the head were obtained using MRA technique without contrast. COMPARISON:  Head CT/CTA 02/10/2018 FINDINGS: MRI HEAD FINDINGS BRAIN: There is multifocal diffusion restriction throughout the right MCA territory, predominantly within the right basal ganglia and insula. There is mild mass effect on the frontal horn of the right lateral ventricle. The midline structures are normal. There is cytotoxic edema within the right MCA distribution according to the areas ischemia. There is petechial hemorrhage within the right basal ganglia. The CSF spaces are normal for age, with no hydrocephalus. Susceptibility-sensitive sequences show no chronic microhemorrhage or superficial siderosis. SKULL AND UPPER CERVICAL SPINE: The visualized skull base, calvarium, upper cervical spine and extracranial soft tissues are normal. SINUSES/ORBITS: No fluid levels or advanced mucosal thickening. No mastoid or middle ear effusion. The orbits are normal. MRA HEAD FINDINGS Intracranial internal carotid arteries: Normal. Anterior cerebral arteries: Normal. Middle cerebral arteries: The M1 segment of the right middle cerebral artery remains occluded. There is return of flow related enhancement within the proximal M2 branches. Left MCA is normal. Posterior  communicating arteries: Not visualized Posterior cerebral arteries: Normal. Basilar artery: Normal. Vertebral arteries: Codominant.  Normal. Superior cerebellar arteries: Normal. Inferior cerebellar arteries: Normal. IMPRESSION: 1. Persistent occlusion of the right middle cerebral artery M1 segment. There is flow related enhancement within the right M2 branches. 2. Large area infarction within the right middle cerebral artery territory, predominantly involving the anterior insula and the basal ganglia. 3. Petechial hemorrhage within the right basal ganglia. Heidelberg classification 1b: HI2, confluent petechiae, no mass effect. Electronically Signed   By: Ulyses Jarred M.D.   On: 02/12/2018 00:25   Mr Jodene Nam Head Wo Contrast  Result Date: 02/12/2018 CLINICAL DATA:  Stroke follow-up.  Right M1 occlusion. EXAM: MRI HEAD WITHOUT CONTRAST MRA HEAD WITHOUT CONTRAST TECHNIQUE: Multiplanar, multiecho pulse sequences of the brain and surrounding structures were obtained without intravenous contrast. Angiographic images of the head were obtained using MRA technique without contrast. COMPARISON:  Head CT/CTA 02/10/2018 FINDINGS: MRI HEAD FINDINGS BRAIN: There is multifocal diffusion restriction throughout the right MCA territory, predominantly within the right basal ganglia and insula. There is mild mass effect on the frontal horn of the right lateral ventricle. The midline structures are normal. There is cytotoxic edema within the  right MCA distribution according to the areas ischemia. There is petechial hemorrhage within the right basal ganglia. The CSF spaces are normal for age, with no hydrocephalus. Susceptibility-sensitive sequences show no chronic microhemorrhage or superficial siderosis. SKULL AND UPPER CERVICAL SPINE: The visualized skull base, calvarium, upper cervical spine and extracranial soft tissues are normal. SINUSES/ORBITS: No fluid levels or advanced mucosal thickening. No mastoid or middle ear effusion.  The orbits are normal. MRA HEAD FINDINGS Intracranial internal carotid arteries: Normal. Anterior cerebral arteries: Normal. Middle cerebral arteries: The M1 segment of the right middle cerebral artery remains occluded. There is return of flow related enhancement within the proximal M2 branches. Left MCA is normal. Posterior communicating arteries: Not visualized Posterior cerebral arteries: Normal. Basilar artery: Normal. Vertebral arteries: Codominant.  Normal. Superior cerebellar arteries: Normal. Inferior cerebellar arteries: Normal. IMPRESSION: 1. Persistent occlusion of the right middle cerebral artery M1 segment. There is flow related enhancement within the right M2 branches. 2. Large area infarction within the right middle cerebral artery territory, predominantly involving the anterior insula and the basal ganglia. 3. Petechial hemorrhage within the right basal ganglia. Heidelberg classification 1b: HI2, confluent petechiae, no mass effect. Electronically Signed   By: Ulyses Jarred M.D.   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  The LA appendage is normal in size.  Cannot rule out early forming thrombus in the LA appendage vs. Artifact from posterior wall of appendage. Normal TV with mild TR Normal PV with trivial PR Normal MV with trivial MR Normal trileaflet AV Normal interatrial septum with no evidence of shunt by colorflow dopper or agitated saline contrast injection Mild atherosclerosis of the thoracic and ascending aorta. The patient had multiple runs of nonsustained atrial tachycardia and PACs during the procedure.  Dg Chest Port 1 View  Result Date: 02/14/2018 CLINICAL DATA:  Fever. EXAM: PORTABLE CHEST 1 VIEW COMPARISON:  Radiographs of September 13, 2017. FINDINGS: Stable cardiomegaly. No  pneumothorax is noted. Right lung is clear. Possible mild left basilar opacity is noted which may represent atelectasis or infiltrate with associated pleural effusion. Lateral radiograph may be performed further evaluation. Bony thorax is unremarkable. IMPRESSION: Possible mild left basilar atelectasis or infiltrate is noted with associated pleural effusion. Lateral projection is recommended for further evaluation. Electronically Signed   By: Marijo Conception, M.D.   On: 02/14/2018 07:40     PHYSICAL EXAM  General - Well nourished, well developed, drowsy, easily arouses to voice or light touch, not in distress. drooling  Ophthalmologic - fundi not visualized due to noncooperation. Respiratory- fluid in left lower lobe Cardiovascular - Regular rate and rhythm.  Neuro - drowsy, easily arouses to light touch, eyes open, right gaze preference but able to cross midline, nystagmus resolved with left gaze.Paucity of speech, able to name 2/2 and repeat but moderate dysarthria. PERRL, full field full but with left simultagnosia. Left facial droop and tongue protrusion to the left. Left UE 0/5 and left LE trace withdraw with pain. RUE and RLE 3/5. Left babinski positive. Sensation symmetrical, coordination not cooperative. Gait not tested.    ASSESSMENT/PLAN [name] is a 77 y.o. female with history of tobacco use, previous TIA, hypertension, cardiomyopathy with an EF of 25%, and congestive heart failure, admitted to [facility] with left-sided weakness  and right gaze preference. No tPA given due to late presentation. Mechanical thrombectomy attempted but failed- minimal recanalizatiobn of occluded RT MCA M1 seg.   Stroke: R MCA infarct with right M1 occlusion failed IR, likely due to cardioembolic source  Resultant right gaze and the left hemiplegia  MRI - large right MCA infarcts including BG, CR and punctate cortical infarcts  MRA  - right M1 occlusion with some flow in right M2  branches  Head CT - right MCA infarcts with right M1 hyperdense signal  CTA H&N - Acute RIGHT M1 MCA occlusion, but previous left M2 occlusion resolved  CTP - Significant mismatch volume of 93 mL, encompassing much of the RIGHT MCA territory.   IR attempted but failed to recanalize right M1  2D Echo - EF 40%  TEE - EF 40%, cannot rule out early forming thrombus in the LAA versus artifact from posterior wall of the appendage, multiple runs of nonsustained A. tach and PACs were noted during the procedure  30-day cardiac event monitoring at discharge  LDL - 52  HgbA1c - 4.7  UDS - negative  Lovenox for VTE prophylaxis  aspirin 81 mg daily prior to admission, now on aspirin 325 mg daily and clopidogrel 75 mg daily.   Therapy recommendations: CIR  Disposition: pending  Transfer to floor today  History of stroke  06/2017 admitted in [place], left MCA infarct with left M2 occlusion and right M1 distal high-grade stenosis, EF 40%.  Put on aspirin 162 and a statin on discharge  This admission MRA showed right M1 occlusion but left M2 is patent now - indicating the embolic pattern  Cardiomyopathy  EF 35 to 40% in 05/2017  EF 40% in 06/2017  TTE and TEE EF 40% this admission  Has been followed with cardiology and on bidil and coreg PTA  Coreg resumed  Not resumed bidil yet due to BP fluctuation.  Hypertension . Stable  . Resumed coreg, but not bidil yet.   Long term BP goal 130-150 given large vessel occlusion  Hyperlipidemia  Home meds:  Lipitor 10 mg daily  LDL 52, goal < 70  Resume Lipitor  Continue statin at discharge  Other Stroke Risk Factors  Advanced age  Former cigarette smoker - quit  CAD  Dysphagia  Secondary to stroke  NPO  Modified Barium swallow today  Other Active Problems  Post procedure anemia hemoglobin 8.9  Elevated Creatinine -1.14-  COPD  Low grade fever 100.5 - pending UA. CXR with mild atelectasis, pleural effusion.    Thrombocytopenia: 144-125  Hospital day # 4  Laurey Morale, MSN, NP-C [facility] [phone]  ATTENDING NOTE: I reviewed above note and agree with the assessment and plan. Pt was seen and examined.   Patient husband and niece at bedside.  Patient lying in bed, awake alert, no acute distress.  Patient had low-grade fever overnight, T-max 100.5.  This morning 100.3.  Concerning for aspiration, chest x-ray showed mild atelectasis on the left and small pleural effusion on the left.  Currently n.p.o. and pending modified barium study today.  We will check UA to rule out UTI.  TEE yesterday unremarkable but found to have and sustained runs of A. tach and PACs.  EP on board recommend 30-day cardiac event monitoring as outpatient to rule out A. fib.  PT/OT recommend CIR.  Will transfer to floor and prepare for CIR placement.  Brandy Hawking, MD PhD Stroke Neurology 02/14/2018 1:18 PM  To contact Stroke Continuity provider, please refer to [email or web address] After hours, contact General Neurology

## 2018-02-14 NOTE — Progress Notes (Addendum)
Referring Physician(s): CODE STROKE- Aroor, Lanice Schwab  Supervising Physician: Luanne Bras  Patient Status:  [facility] - In-pt  Chief Complaint: None  Subjective:  Right MCA M1 segment occlusion s/p revascularization using emergent mechanical thrombectomy 02/10/2018 by Dr. Estanislado Pandy. Patient sitting in chair, she is lethargic but responds to voice. Accompanied by female family member. RN reports fever, CXR this AM with mild left pleural effusion. Still no spontaneous movement of left side but PT reports minimal pain withdraw of left big toe. Speech still moderately dysarthric. Still with left facial droop/tongue protrusion to left. Right groin incision c/d/i.   Allergies: Lisinopril  Medications: Prior to Admission medications   Medication Sig Start Date End Date Taking? Authorizing Provider  aspirin EC 81 MG EC tablet Take 2 tablets (162 mg total) by mouth daily. 07/14/17  Yes Barton Dubois, MD  atorvastatin (LIPITOR) 10 MG tablet TAKE 1 TABLET BY MOUTH EVERY DAY AT 6 PM 09/12/17  Yes Herminio Commons, MD  BIDIL 20-37.5 MG tablet TAKE 1 TABLET BY MOUTH THREE TIMES DAILY 09/12/17  Yes Herminio Commons, MD  carvedilol (COREG) 12.5 MG tablet Take 1 tablet (12.5 mg total) by mouth 2 (two) times daily with a meal. 08/25/17  Yes Herminio Commons, MD  cetirizine (ZYRTEC) 10 MG tablet Take 10 mg by mouth daily as needed for allergies.    Yes [provider]  donepezil (ARICEPT) 5 MG tablet Take 1 tablet by mouth at bedtime. 11/15/17  Yes [provider]  furosemide (LASIX) 20 MG tablet Take 1 tablet (20 mg total) by mouth daily as needed for edema. 02/08/18 05/09/18 Yes Strader, Fransisco Hertz, PA-C  predniSONE (DELTASONE) 5 MG tablet Take 5 mg by mouth daily as needed (for swelling and right knee pain).   Yes [provider]  umeclidinium-vilanterol (ANORO ELLIPTA) 62.5-25 MCG/INH AEPB Inhale 1 puff into the lungs daily. 12/08/17  Yes Lauraine Rinne, NP      Vital Signs: BP 123/67   Pulse 79   Temp 100.3 F (37.9 C) (Oral)   Resp 20   Ht 5\' 4"  (1.626 m)   Wt 151 lb 0.2 oz (68.5 kg)   SpO2 98%   BMI 25.92 kg/m   Physical Exam  Constitutional: She appears well-developed and well-nourished. No distress.  Lethargic but responds to voice.  Pulmonary/Chest: Effort normal. No respiratory distress.  Neurological:  Lethargic but responds to voice. Demonstrates moderate dysarthric speech. PERRL bilaterally. EOMs intact bilaterally without nystagmus or subjective diplopia. Visual fields not assessed. Demonstrates left facial droop. Tongue deviates to left. Can spontaneously move right side but no spontaneous movements of left side. Pronator drift not assessed. Fine motor and coordination not assessed. Gait not assessed. Romberg not assessed. Heel to toe not assessed. Distal pulses 2+ bilaterally.  Skin: Skin is warm and dry.  Right groin incision soft without active bleeding or hematoma.  Psychiatric: She has a normal mood and affect. Her behavior is normal. Judgment and thought content normal.  Lethargic but responds to voice.    Imaging: Ct Angio Head W Or Wo Contrast  Result Date: 02/10/2018 CLINICAL DATA:  Acute onset of LEFT-sided weakness. Slurred speech. Last seen normal earlier 0930 hours. EXAM: CT ANGIOGRAPHY HEAD AND NECK CT PERFUSION BRAIN TECHNIQUE: Multidetector CT imaging of the head and neck was performed using the standard protocol during bolus administration of intravenous contrast. Multiplanar CT image reconstructions and MIPs were obtained to evaluate the vascular anatomy. Carotid stenosis measurements (when applicable) are  obtained utilizing NASCET criteria, using the distal internal carotid diameter as the denominator. Multiphase CT imaging of the brain was performed following IV bolus contrast injection. Subsequent parametric perfusion maps were calculated using RAPID software. CONTRAST:  155mL ISOVUE-370  IOPAMIDOL (ISOVUE-370) INJECTION 76% COMPARISON:  Code stroke CT head earlier today. FINDINGS: CTA NECK FINDINGS Aortic arch: Standard branching. Imaged portion shows no evidence of aneurysm or dissection. No significant stenosis of the major arch vessel origins. Right carotid system: No evidence of dissection, stenosis (50% or greater) or occlusion. Left carotid system: No evidence of dissection, stenosis (50% or greater) or occlusion. Vertebral arteries: Codominant. No evidence of dissection, stenosis (50% or greater) or occlusion. Skeleton: Spondylosis.  Prior C5-C7 ACDF. Other neck: No masses. Upper chest: Mild edema.  No pneumothorax. Review of the MIP images confirms the above findings CTA HEAD FINDINGS Anterior circulation: Minor calcific atheromatous change in the LEFT carotid siphon. No significant calcification on the RIGHT. ICA termini widely patent. Acute RIGHT M1 MCA occlusion. No similar findings on the LEFT. Collateralization provides fair to good opacification of the M2 and M3 branches on the RIGHT. Posterior circulation: No significant stenosis, proximal occlusion, aneurysm, or vascular malformation. Venous sinuses: As permitted by contrast timing, patent. Anatomic variants: None significant. Delayed phase: Not performed. Review of the MIP images confirms the above findings CT Brain Perfusion Findings: CBF (<30%) Volume: 9.59mL Perfusion (Tmax>6.0s) volume: 102.50mL Mismatch Volume: 93.31mL Infarction Location:RIGHT basal ganglia, and regional white matter. IMPRESSION: Acute RIGHT M1 MCA occlusion.  Fair to good collateralization. No extracranial stenosis/dissection or carotid siphon stenosis. Significant mismatch volume of 93 mL, encompassing much of the RIGHT MCA territory. See discussion above. These results were called by telephone at the time of interpretation on 02/10/2018 at 11:13 am to Dr. Francine Graven , who verbally acknowledged these results. Electronically Signed   By: Staci Righter M.D.    On: 02/10/2018 11:36   Ct Angio Neck W Or Wo Contrast  Result Date: 02/10/2018 CLINICAL DATA:  Acute onset of LEFT-sided weakness. Slurred speech. Last seen normal earlier 0930 hours. EXAM: CT ANGIOGRAPHY HEAD AND NECK CT PERFUSION BRAIN TECHNIQUE: Multidetector CT imaging of the head and neck was performed using the standard protocol during bolus administration of intravenous contrast. Multiplanar CT image reconstructions and MIPs were obtained to evaluate the vascular anatomy. Carotid stenosis measurements (when applicable) are obtained utilizing NASCET criteria, using the distal internal carotid diameter as the denominator. Multiphase CT imaging of the brain was performed following IV bolus contrast injection. Subsequent parametric perfusion maps were calculated using RAPID software. CONTRAST:  165mL ISOVUE-370 IOPAMIDOL (ISOVUE-370) INJECTION 76% COMPARISON:  Code stroke CT head earlier today. FINDINGS: CTA NECK FINDINGS Aortic arch: Standard branching. Imaged portion shows no evidence of aneurysm or dissection. No significant stenosis of the major arch vessel origins. Right carotid system: No evidence of dissection, stenosis (50% or greater) or occlusion. Left carotid system: No evidence of dissection, stenosis (50% or greater) or occlusion. Vertebral arteries: Codominant. No evidence of dissection, stenosis (50% or greater) or occlusion. Skeleton: Spondylosis.  Prior C5-C7 ACDF. Other neck: No masses. Upper chest: Mild edema.  No pneumothorax. Review of the MIP images confirms the above findings CTA HEAD FINDINGS Anterior circulation: Minor calcific atheromatous change in the LEFT carotid siphon. No significant calcification on the RIGHT. ICA termini widely patent. Acute RIGHT M1 MCA occlusion. No similar findings on the LEFT. Collateralization provides fair to good opacification of the M2 and M3 branches on the RIGHT. Posterior circulation: No  significant stenosis, proximal occlusion, aneurysm, or  vascular malformation. Venous sinuses: As permitted by contrast timing, patent. Anatomic variants: None significant. Delayed phase: Not performed. Review of the MIP images confirms the above findings CT Brain Perfusion Findings: CBF (<30%) Volume: 9.36mL Perfusion (Tmax>6.0s) volume: 102.11mL Mismatch Volume: 93.41mL Infarction Location:RIGHT basal ganglia, and regional white matter. IMPRESSION: Acute RIGHT M1 MCA occlusion.  Fair to good collateralization. No extracranial stenosis/dissection or carotid siphon stenosis. Significant mismatch volume of 93 mL, encompassing much of the RIGHT MCA territory. See discussion above. These results were called by telephone at the time of interpretation on 02/10/2018 at 11:13 am to Dr. Francine Graven , who verbally acknowledged these results. Electronically Signed   By: Staci Righter M.D.   On: 02/10/2018 11:36   Mr Brain Wo Contrast  Result Date: 02/12/2018 CLINICAL DATA:  Stroke follow-up.  Right M1 occlusion. EXAM: MRI HEAD WITHOUT CONTRAST MRA HEAD WITHOUT CONTRAST TECHNIQUE: Multiplanar, multiecho pulse sequences of the brain and surrounding structures were obtained without intravenous contrast. Angiographic images of the head were obtained using MRA technique without contrast. COMPARISON:  Head CT/CTA 02/10/2018 FINDINGS: MRI HEAD FINDINGS BRAIN: There is multifocal diffusion restriction throughout the right MCA territory, predominantly within the right basal ganglia and insula. There is mild mass effect on the frontal horn of the right lateral ventricle. The midline structures are normal. There is cytotoxic edema within the right MCA distribution according to the areas ischemia. There is petechial hemorrhage within the right basal ganglia. The CSF spaces are normal for age, with no hydrocephalus. Susceptibility-sensitive sequences show no chronic microhemorrhage or superficial siderosis. SKULL AND UPPER CERVICAL SPINE: The visualized skull base, calvarium, upper  cervical spine and extracranial soft tissues are normal. SINUSES/ORBITS: No fluid levels or advanced mucosal thickening. No mastoid or middle ear effusion. The orbits are normal. MRA HEAD FINDINGS Intracranial internal carotid arteries: Normal. Anterior cerebral arteries: Normal. Middle cerebral arteries: The M1 segment of the right middle cerebral artery remains occluded. There is return of flow related enhancement within the proximal M2 branches. Left MCA is normal. Posterior communicating arteries: Not visualized Posterior cerebral arteries: Normal. Basilar artery: Normal. Vertebral arteries: Codominant.  Normal. Superior cerebellar arteries: Normal. Inferior cerebellar arteries: Normal. IMPRESSION: 1. Persistent occlusion of the right middle cerebral artery M1 segment. There is flow related enhancement within the right M2 branches. 2. Large area infarction within the right middle cerebral artery territory, predominantly involving the anterior insula and the basal ganglia. 3. Petechial hemorrhage within the right basal ganglia. Heidelberg classification 1b: HI2, confluent petechiae, no mass effect. Electronically Signed   By: Ulyses Jarred M.D.   On: 02/12/2018 00:25   [place]  Result Date: 02/14/2018 INDICATION: Right gaze deviation. Left-sided hemiplegia. CT angiogram head and neck revealing a right middle cerebral artery proximal occlusion. EXAM: 1. EMERGENT LARGE VESSEL OCCLUSION THROMBOLYSIS (anterior CIRCULATION) COMPARISON:  CT angiogram of the head and neck of 02/10/2018. MEDICATIONS: Ancef 2 g IV antibiotic was administered within 1 hour of the procedure. ANESTHESIA/SEDATION: General anesthesia CONTRAST:  Isovue 300 approximately 115 mL FLUOROSCOPY TIME:  Fluoroscopy Time: 115 minutes 42 seconds (5540 mGy). COMPLICATIONS: None immediate. TECHNIQUE: Following a full explanation of the procedure along with the potential associated complications, an informed witnessed consent was obtained from  the patient is husband. The risks of intracranial hemorrhage of 10%, worsening neurological deficit, ventilator dependency, death and inability to revascularize were all reviewed in detail with the patient's husband. The patient was then put under general  anesthesia by the Department of Anesthesiology at [facility]. The right groin was prepped and draped in the usual sterile fashion. Thereafter using modified Seldinger technique, transfemoral access into the right common femoral artery was obtained without difficulty. Over a 0.035 inch guidewire a 5 French Pinnacle sheath was inserted. Through this, and also over a 0.035 inch guidewire a 5 [place] JB 1 catheter was advanced to the aortic arch region and selectively positioned in the innominate artery and the right common carotid artery. FINDINGS: The innominate artery injection demonstrates the origin of the right subclavian artery and the right common carotid artery to be widely patent. The right vertebral artery origin is patent. The vessel is seen to opacify to the cranial skull base. Normal opacification is seen of the right vertebrobasilar junction, the right posterior-inferior cerebellar artery and the proximal basilar artery on lateral projections. The right common carotid arteriogram demonstrates the right external carotid artery and its major branches to be widely patent. The right internal carotid artery at the bulb to the cranial skull base demonstrates wide patency. The petrous segment demonstrates normal caliber. There is a sharp U shaped cavernous carotid leading to the right supraclinoid ICA which demonstrates a moderate stenosis just proximal to the right ICA terminus. The right middle cerebral artery demonstrates complete occlusion in the proximal M1 segment. A blush of contrast is noted emanating from the mid right M1 segment projecting into the regions of the basal ganglia. The delayed arterial phase demonstrates partial reconstitution of  the right MCA distal branches from leptomeningeal collaterals arising from the pericallosal and the callosal marginal branches of the right anterior cerebral artery. The right anterior cerebral artery demonstrates normal opacification into the capillary and venous phases. PROCEDURE: The diagnostic JB 1 catheter in the right common carotid artery was exchanged over a 0.035 inch 300 cm Rosen exchange guidewire for an 8 French 55 cm Brite tip neurovascular sheath. Good aspiration was obtained from the hub of the neurovascular sheath. This was then connected to continuous heparinized saline infusion. Over the [facility] exchange guidewire, an 85 cm 8 [place] FlowGate balloon guide catheter which had been prepped with 50% contrast and 50% heparinized saline infusion was advanced and positioned just proximal to the right common carotid bifurcation. The guidewire was removed. Good aspiration obtained from the hub of the [facility] guide catheter. A gentle control arteriogram demonstrates patency of the vessel distally. No evidence of focal intraluminal filling defects or dissections or spasm was noted. Over a 0.014 inch Softip Synchro micro guidewire, a combination of a 115 cm Navien guide catheter inside of which was an 021 Trevo ProVue microcatheter was advanced to the distal end of the [facility] guide catheter. The latter had been advanced to the mid cervical right ICA. With the micro guidewire leading with a J-tip configuration, the combination was navigated to the supraclinoid right ICA. Using a torque device, the micro guidewire was then gently manipulated meeting moderate resistance to the advancement of the micro guidewire. Eventual manipulation with a torque device allowed opacification into a superior division branch M2 M3 region followed by the microcatheter. The guidewire was removed. There was slow aspiration of contrast from the hub of the microcatheter. The microcatheter was then gently retrieved more proximally  until free aspiration of blood was noted. This was then connected to continuous heparinized saline infusion. A 5 mm x 33 mm Embotrap retrieval device was then advanced to the distal end of the microcatheter using biplane roadmap technique and constant fluoroscopic guidance  in a coaxial manner and with constant heparinized saline infusion. The proximal and the distal landing zones were identified. The O ring on the delivery microcatheter was then loosened. With slight forward gentle traction with the right hand on the delivery micro guidewire, the microcatheter was retrieved unsheathing the distal and then the proximal portion of the device. A control arteriogram performed through the Navien guide catheter in the supraclinoid right ICA demonstrated no significant visualization of the right middle cerebral artery branches. Proximal flow arrest was then initiated in the mid right ICA cervical region by inflating the balloon at the tip of the [facility] guide catheter. Thereafter, as constant aspiration was applied with a 60 mL syringe at the hub of the [facility] guide catheter, and with a vacuum pump at the hub of the Navien guide catheter, the combination of the retrieval device, the microcatheter and the Navien guide catheter were then retrieved and removed. Aspiration was continued into the hub of the [facility] guide catheter as the flow arrest was reversed in the right internal carotid artery. Free back bleed of blood was noted at the hub of the [facility] guide catheter. Gentle control arteriogram performed through the right internal carotid artery continued to demonstrate occlusion of the right middle cerebral artery. A second pass was then made using the above combination and advanced to the supraclinoid right ICA. The micro guidewire was then gently manipulated using a torque device and advanced through the occluded right middle cerebral and into the M2 M3 region of the inferior division. Good aspiration was obtained  from the hub of the microcatheter. Gentle contrast injection demonstrated safe position of tip of the microcatheter. This was then connected to continuous heparinized saline infusion. A 5 mm x 33 mm Embotrap retrieval device was again advanced the distal end of the microcatheter. The proximal and the distal landing zones were then defined. The device was then deployed as mentioned above. A control arteriogram performed through the Navien guide catheter in the right internal carotid artery demonstrated a revascularization with diminutive right MCA branches. With proximal flow arrest in the right internal carotid artery, the combination of the retrieval device, the microcatheter and the Navien guide catheter were retrieved and removed as constant aspiration was applied with a 60 mL syringe at the hub of the [facility] guide catheter, and with a Penumbra suction vacuum pump at the hub of the 5 [place] Navien guide catheter. Proximal flow arrest was then reversed by deflating the balloon of the [facility] guide catheter in the right internal carotid artery. Free back bleed of blood was noted at the hub of the [facility] guide catheter. A gentle control arteriogram performed through the 8 [place] FlowGate guide catheter in the right internal carotid artery demonstrated minimal improved flow in the proximal right middle cerebral artery. Delayed arterial phase demonstrated slow opacification of the right middle cerebral artery branches in the trifurcation region. Also noted was a blush of contrast emanating from the recurrent artery of [place] from the proximal right anterior cerebral A1 segment, and also the anterior temporal branch and the numerous fine branches arising from the right middle cerebral M1 segment. There were a few streaks of clot noted in the retrieval device. At this time a control arteriogram also demonstrated a focal smooth outpouching in the petrous segment of the right internal carotid artery proximally.  Access distal to the dissection was eventually obtained over a micro guidewire using a Trevo ProVue 021 microcatheter. This was then connected to continuous heparinized saline infusion.  Attempts were then made to place a stent across the flap which was nonobstructive. However, significant tortuosity in the aortic arch, and also the right internal carotid artery proximally precluded with deployment of the device. This was then retrieved and removed. Upon removal the flap was noted to have been tacked to the native vessel with excellent flow passed the native vessel. An arteriogram performed through [facility] guide catheter in the right internal carotid artery demonstrated brisk flow into the right internal carotid artery cranial skull base and the right internal carotid artery supraclinoid segment. Free flow was noted in the right anterior cerebral artery. Also demonstrated at this time was flow through the previously occluded right middle cerebral artery with filling defect proximally, leading to a tapered severe occlusion associated with a blush of contrast. The delayed arterial phase demonstrated progressive retrograde opacification of the peri-insular branches. Given the above an angio findings, and the retrograde opacification of the distal right middle cerebral artery M3 M4 branches, it was decided to stop. A final control arteriogram performed through the [facility] guide catheter in the right internal carotid artery demonstrated excellent flow through the site of the previously noted dissection without evidence of stenosis or of intraluminal filling defects. More distally, there continued to be occlusion of the right middle cerebral artery with slow permeation of contrast in the proximal right middle cerebral artery M1 segment. The delayed arterial phase continued to demonstrate retrograde opacification of the right MCA distribution M3 M4 and perisylvian branches. The FlowGate guide catheter was then retrieved  into the abdominal aorta and with the neurovascular sheath, was placed over a 0.035 inch guidewire was for an 8 [place] Pinnacle sheath. This was then removed with successful closure with a 7 [place] Exo-Seal closure device. The right groin appeared soft with distal pulses being Dopplerable in the posterior tibial, in the dorsalis pedis arteries bilaterally unchanged. The patient was then extubated without difficulty. A CT scan of the brain revealed no evidence of a gross intracranial hemorrhage or mass effect or midline shift. There was a focal hyper density in the proximal sylvian fissure at site of the origin and proximal right MCA. It was felt this could represent a focal area of contrast in the thrombus itself with a possible small amount of subarachnoid leakage. The patient was then extubated without difficulty. Upon recovery, the patient was able to obey simple commands like opening her eyes to name and move her right lower extremity to command. She was then transferred to neuro ICU for further post thrombectomy management. IMPRESSION: Status post endovascular revascularization of occluded right middle cerebral artery with 2 passes using the 5 mm x 33 mm Embotrap retrieval device achieving a TICI 2b revascularization with delayed closure and angiographic evidence of slow revascularization from collaterals arising from the recurrent artery of [place]ub[place], and the lateral lenticulostriate branches, and also the pericallosal and the callosal marginal branches of the right anterior cerebral artery. Focal small dissection in the right internal carotid artery petrous segment intact without intraluminal filling defects or stenosis. PLAN: Follow-up in the clinic approximately 6 weeks post discharge. Electronically Signed   By: Luanne Bras M.D.   On: 02/13/2018 11:14   Ct Cerebral Perfusion W Contrast  Result Date: 02/10/2018 CLINICAL DATA:  Acute onset of LEFT-sided weakness. Slurred speech. Last seen normal  earlier 0930 hours. EXAM: CT ANGIOGRAPHY HEAD AND NECK CT PERFUSION BRAIN TECHNIQUE: Multidetector CT imaging of the head and neck was performed using the standard protocol during bolus administration of intravenous  contrast. Multiplanar CT image reconstructions and MIPs were obtained to evaluate the vascular anatomy. Carotid stenosis measurements (when applicable) are obtained utilizing NASCET criteria, using the distal internal carotid diameter as the denominator. Multiphase CT imaging of the brain was performed following IV bolus contrast injection. Subsequent parametric perfusion maps were calculated using RAPID software. CONTRAST:  163mL ISOVUE-370 IOPAMIDOL (ISOVUE-370) INJECTION 76% COMPARISON:  Code stroke CT head earlier today. FINDINGS: CTA NECK FINDINGS Aortic arch: Standard branching. Imaged portion shows no evidence of aneurysm or dissection. No significant stenosis of the major arch vessel origins. Right carotid system: No evidence of dissection, stenosis (50% or greater) or occlusion. Left carotid system: No evidence of dissection, stenosis (50% or greater) or occlusion. Vertebral arteries: Codominant. No evidence of dissection, stenosis (50% or greater) or occlusion. Skeleton: Spondylosis.  Prior C5-C7 ACDF. Other neck: No masses. Upper chest: Mild edema.  No pneumothorax. Review of the MIP images confirms the above findings CTA HEAD FINDINGS Anterior circulation: Minor calcific atheromatous change in the LEFT carotid siphon. No significant calcification on the RIGHT. ICA termini widely patent. Acute RIGHT M1 MCA occlusion. No similar findings on the LEFT. Collateralization provides fair to good opacification of the M2 and M3 branches on the RIGHT. Posterior circulation: No significant stenosis, proximal occlusion, aneurysm, or vascular malformation. Venous sinuses: As permitted by contrast timing, patent. Anatomic variants: None significant. Delayed phase: Not performed. Review of the MIP images  confirms the above findings CT Brain Perfusion Findings: CBF (<30%) Volume: 9.42mL Perfusion (Tmax>6.0s) volume: 102.3mL Mismatch Volume: 93.35mL Infarction Location:RIGHT basal ganglia, and regional white matter. IMPRESSION: Acute RIGHT M1 MCA occlusion.  Fair to good collateralization. No extracranial stenosis/dissection or carotid siphon stenosis. Significant mismatch volume of 93 mL, encompassing much of the RIGHT MCA territory. See discussion above. These results were called by telephone at the time of interpretation on 02/10/2018 at 11:13 am to Dr. Francine Graven , who verbally acknowledged these results. Electronically Signed   By: Staci Righter M.D.   On: 02/10/2018 11:36   Dg Chest Port 1 View  Result Date: 02/14/2018 CLINICAL DATA:  Fever. EXAM: PORTABLE CHEST 1 VIEW COMPARISON:  Radiographs of September 13, 2017. FINDINGS: Stable cardiomegaly. No pneumothorax is noted. Right lung is clear. Possible mild left basilar opacity is noted which may represent atelectasis or infiltrate with associated pleural effusion. Lateral radiograph may be performed further evaluation. Bony thorax is unremarkable. IMPRESSION: Possible mild left basilar atelectasis or infiltrate is noted with associated pleural effusion. Lateral projection is recommended for further evaluation. Electronically Signed   By: Marijo Conception, M.D.   On: 02/14/2018 07:40   Mr Jodene Nam Head Wo Contrast  Result Date: 02/12/2018 CLINICAL DATA:  Stroke follow-up.  Right M1 occlusion. EXAM: MRI HEAD WITHOUT CONTRAST MRA HEAD WITHOUT CONTRAST TECHNIQUE: Multiplanar, multiecho pulse sequences of the brain and surrounding structures were obtained without intravenous contrast. Angiographic images of the head were obtained using MRA technique without contrast. COMPARISON:  Head CT/CTA 02/10/2018 FINDINGS: MRI HEAD FINDINGS BRAIN: There is multifocal diffusion restriction throughout the right MCA territory, predominantly within the right basal ganglia and  insula. There is mild mass effect on the frontal horn of the right lateral ventricle. The midline structures are normal. There is cytotoxic edema within the right MCA distribution according to the areas ischemia. There is petechial hemorrhage within the right basal ganglia. The CSF spaces are normal for age, with no hydrocephalus. Susceptibility-sensitive sequences show no chronic microhemorrhage or superficial siderosis. SKULL AND UPPER CERVICAL SPINE:  The visualized skull base, calvarium, upper cervical spine and extracranial soft tissues are normal. SINUSES/ORBITS: No fluid levels or advanced mucosal thickening. No mastoid or middle ear effusion. The orbits are normal. MRA HEAD FINDINGS Intracranial internal carotid arteries: Normal. Anterior cerebral arteries: Normal. Middle cerebral arteries: The M1 segment of the right middle cerebral artery remains occluded. There is return of flow related enhancement within the proximal M2 branches. Left MCA is normal. Posterior communicating arteries: Not visualized Posterior cerebral arteries: Normal. Basilar artery: Normal. Vertebral arteries: Codominant.  Normal. Superior cerebellar arteries: Normal. Inferior cerebellar arteries: Normal. IMPRESSION: 1. Persistent occlusion of the right middle cerebral artery M1 segment. There is flow related enhancement within the right M2 branches. 2. Large area infarction within the right middle cerebral artery territory, predominantly involving the anterior insula and the basal ganglia. 3. Petechial hemorrhage within the right basal ganglia. Heidelberg classification 1b: HI2, confluent petechiae, no mass effect. Electronically Signed   By: Ulyses Jarred M.D.   On: 02/12/2018 00:25   Ir Percutaneous Art Thrombectomy/infusion Intracranial Inc Diag Angio  Result Date: 02/14/2018 INDICATION: Right gaze deviation. Left-sided hemiplegia. CT angiogram head and neck revealing a right middle cerebral artery proximal occlusion. EXAM: 1.  EMERGENT LARGE VESSEL OCCLUSION THROMBOLYSIS (anterior CIRCULATION) COMPARISON:  CT angiogram of the head and neck of 02/10/2018. MEDICATIONS: Ancef 2 g IV antibiotic was administered within 1 hour of the procedure. ANESTHESIA/SEDATION: General anesthesia CONTRAST:  Isovue 300 approximately 115 mL FLUOROSCOPY TIME:  Fluoroscopy Time: 115 minutes 42 seconds (5540 mGy). COMPLICATIONS: None immediate. TECHNIQUE: Following a full explanation of the procedure along with the potential associated complications, an informed witnessed consent was obtained from the patient is husband. The risks of intracranial hemorrhage of 10%, worsening neurological deficit, ventilator dependency, death and inability to revascularize were all reviewed in detail with the patient's husband. The patient was then put under general anesthesia by the Department of Anesthesiology at [facility]. The right groin was prepped and draped in the usual sterile fashion. Thereafter using modified Seldinger technique, transfemoral access into the right common femoral artery was obtained without difficulty. Over a 0.035 inch guidewire a 5 French Pinnacle sheath was inserted. Through this, and also over a 0.035 inch guidewire a 5 [place] JB 1 catheter was advanced to the aortic arch region and selectively positioned in the innominate artery and the right common carotid artery. FINDINGS: The innominate artery injection demonstrates the origin of the right subclavian artery and the right common carotid artery to be widely patent. The right vertebral artery origin is patent. The vessel is seen to opacify to the cranial skull base. Normal opacification is seen of the right vertebrobasilar junction, the right posterior-inferior cerebellar artery and the proximal basilar artery on lateral projections. The right common carotid arteriogram demonstrates the right external carotid artery and its major branches to be widely patent. The right internal carotid  artery at the bulb to the cranial skull base demonstrates wide patency. The petrous segment demonstrates normal caliber. There is a sharp U shaped cavernous carotid leading to the right supraclinoid ICA which demonstrates a moderate stenosis just proximal to the right ICA terminus. The right middle cerebral artery demonstrates complete occlusion in the proximal M1 segment. A blush of contrast is noted emanating from the mid right M1 segment projecting into the regions of the basal ganglia. The delayed arterial phase demonstrates partial reconstitution of the right MCA distal branches from leptomeningeal collaterals arising from the pericallosal and the callosal marginal branches of the  right anterior cerebral artery. The right anterior cerebral artery demonstrates normal opacification into the capillary and venous phases. PROCEDURE: The diagnostic JB 1 catheter in the right common carotid artery was exchanged over a 0.035 inch 300 cm Rosen exchange guidewire for an 8 French 55 cm Brite tip neurovascular sheath. Good aspiration was obtained from the hub of the neurovascular sheath. This was then connected to continuous heparinized saline infusion. Over the [facility] exchange guidewire, an 85 cm 8 [place] FlowGate balloon guide catheter which had been prepped with 50% contrast and 50% heparinized saline infusion was advanced and positioned just proximal to the right common carotid bifurcation. The guidewire was removed. Good aspiration obtained from the hub of the [facility] guide catheter. A gentle control arteriogram demonstrates patency of the vessel distally. No evidence of focal intraluminal filling defects or dissections or spasm was noted. Over a 0.014 inch Softip Synchro micro guidewire, a combination of a 115 cm Navien guide catheter inside of which was an 021 Trevo ProVue microcatheter was advanced to the distal end of the [facility] guide catheter. The latter had been advanced to the mid cervical right ICA. With the  micro guidewire leading with a J-tip configuration, the combination was navigated to the supraclinoid right ICA. Using a torque device, the micro guidewire was then gently manipulated meeting moderate resistance to the advancement of the micro guidewire. Eventual manipulation with a torque device allowed opacification into a superior division branch M2 M3 region followed by the microcatheter. The guidewire was removed. There was slow aspiration of contrast from the hub of the microcatheter. The microcatheter was then gently retrieved more proximally until free aspiration of blood was noted. This was then connected to continuous heparinized saline infusion. A 5 mm x 33 mm Embotrap retrieval device was then advanced to the distal end of the microcatheter using biplane roadmap technique and constant fluoroscopic guidance in a coaxial manner and with constant heparinized saline infusion. The proximal and the distal landing zones were identified. The O ring on the delivery microcatheter was then loosened. With slight forward gentle traction with the right hand on the delivery micro guidewire, the microcatheter was retrieved unsheathing the distal and then the proximal portion of the device. A control arteriogram performed through the Navien guide catheter in the supraclinoid right ICA demonstrated no significant visualization of the right middle cerebral artery branches. Proximal flow arrest was then initiated in the mid right ICA cervical region by inflating the balloon at the tip of the [facility] guide catheter. Thereafter, as constant aspiration was applied with a 60 mL syringe at the hub of the [facility] guide catheter, and with a vacuum pump at the hub of the Navien guide catheter, the combination of the retrieval device, the microcatheter and the Navien guide catheter were then retrieved and removed. Aspiration was continued into the hub of the [facility] guide catheter as the flow arrest was reversed in the right  internal carotid artery. Free back bleed of blood was noted at the hub of the [facility] guide catheter. Gentle control arteriogram performed through the right internal carotid artery continued to demonstrate occlusion of the right middle cerebral artery. A second pass was then made using the above combination and advanced to the supraclinoid right ICA. The micro guidewire was then gently manipulated using a torque device and advanced through the occluded right middle cerebral and into the M2 M3 region of the inferior division. Good aspiration was obtained from the hub of the microcatheter. Gentle contrast injection demonstrated safe  position of tip of the microcatheter. This was then connected to continuous heparinized saline infusion. A 5 mm x 33 mm Embotrap retrieval device was again advanced the distal end of the microcatheter. The proximal and the distal landing zones were then defined. The device was then deployed as mentioned above. A control arteriogram performed through the Navien guide catheter in the right internal carotid artery demonstrated a revascularization with diminutive right MCA branches. With proximal flow arrest in the right internal carotid artery, the combination of the retrieval device, the microcatheter and the Navien guide catheter were retrieved and removed as constant aspiration was applied with a 60 mL syringe at the hub of the [facility] guide catheter, and with a Penumbra suction vacuum pump at the hub of the 5 [place] Navien guide catheter. Proximal flow arrest was then reversed by deflating the balloon of the [facility] guide catheter in the right internal carotid artery. Free back bleed of blood was noted at the hub of the [facility] guide catheter. A gentle control arteriogram performed through the 8 [place] FlowGate guide catheter in the right internal carotid artery demonstrated minimal improved flow in the proximal right middle cerebral artery. Delayed arterial phase demonstrated slow  opacification of the right middle cerebral artery branches in the trifurcation region. Also noted was a blush of contrast emanating from the recurrent artery of [place] from the proximal right anterior cerebral A1 segment, and also the anterior temporal branch and the numerous fine branches arising from the right middle cerebral M1 segment. There were a few streaks of clot noted in the retrieval device. At this time a control arteriogram also demonstrated a focal smooth outpouching in the petrous segment of the right internal carotid artery proximally. Access distal to the dissection was eventually obtained over a micro guidewire using a Trevo ProVue 021 microcatheter. This was then connected to continuous heparinized saline infusion. Attempts were then made to place a stent across the flap which was nonobstructive. However, significant tortuosity in the aortic arch, and also the right internal carotid artery proximally precluded with deployment of the device. This was then retrieved and removed. Upon removal the flap was noted to have been tacked to the native vessel with excellent flow passed the native vessel. An arteriogram performed through [facility] guide catheter in the right internal carotid artery demonstrated brisk flow into the right internal carotid artery cranial skull base and the right internal carotid artery supraclinoid segment. Free flow was noted in the right anterior cerebral artery. Also demonstrated at this time was flow through the previously occluded right middle cerebral artery with filling defect proximally, leading to a tapered severe occlusion associated with a blush of contrast. The delayed arterial phase demonstrated progressive retrograde opacification of the peri-insular branches. Given the above an angio findings, and the retrograde opacification of the distal right middle cerebral artery M3 M4 branches, it was decided to stop. A final control arteriogram performed through the  [facility] guide catheter in the right internal carotid artery demonstrated excellent flow through the site of the previously noted dissection without evidence of stenosis or of intraluminal filling defects. More distally, there continued to be occlusion of the right middle cerebral artery with slow permeation of contrast in the proximal right middle cerebral artery M1 segment. The delayed arterial phase continued to demonstrate retrograde opacification of the right MCA distribution M3 M4 and perisylvian branches. The FlowGate guide catheter was then retrieved into the abdominal aorta and with the neurovascular sheath, was placed over a 0.035 inch  guidewire was for an 8 [place] Pinnacle sheath. This was then removed with successful closure with a 7 [place] Exo-Seal closure device. The right groin appeared soft with distal pulses being Dopplerable in the posterior tibial, in the dorsalis pedis arteries bilaterally unchanged. The patient was then extubated without difficulty. A CT scan of the brain revealed no evidence of a gross intracranial hemorrhage or mass effect or midline shift. There was a focal hyper density in the proximal sylvian fissure at site of the origin and proximal right MCA. It was felt this could represent a focal area of contrast in the thrombus itself with a possible small amount of subarachnoid leakage. The patient was then extubated without difficulty. Upon recovery, the patient was able to obey simple commands like opening her eyes to name and move her right lower extremity to command. She was then transferred to neuro ICU for further post thrombectomy management. IMPRESSION: Status post endovascular revascularization of occluded right middle cerebral artery with 2 passes using the 5 mm x 33 mm Embotrap retrieval device achieving a TICI 2b revascularization with delayed closure and angiographic evidence of slow revascularization from collaterals arising from the recurrent artery of [place]ub[place], and  the lateral lenticulostriate branches, and also the pericallosal and the callosal marginal branches of the right anterior cerebral artery. Focal small dissection in the right internal carotid artery petrous segment intact without intraluminal filling defects or stenosis. PLAN: Follow-up in the clinic approximately 6 weeks post discharge. Electronically Signed   By: Luanne Bras M.D.   On: 02/13/2018 11:14   Ct Head Code Stroke Wo Contrast  Result Date: 02/10/2018 CLINICAL DATA:  Code stroke. LEFT-sided weakness. Patient drooling in bed. Last seen normal 0930 hours EXAM: CT HEAD WITHOUT CONTRAST TECHNIQUE: Contiguous axial images were obtained from the base of the skull through the vertex without intravenous contrast. COMPARISON:  CT head 07/12/2017. MR head and MRA intracranial 07/13/2017. CT angiography and CT perfusion reported separately. FINDINGS: Brain: Well-demarcated areas of cortical hypodensity affecting the RIGHT temporal lobe, and RIGHT posterior frontal lobe, most consistent with subacute infarction. Ill-defined areas of low-attenuation in the RIGHT frontal cortex, RIGHT insula, and suspected RIGHT basal ganglia consistent with acute infarction. No hemorrhage, mass lesion, hydrocephalus, or extra-axial fluid. Chronic LEFT hemisphere infarcts. Vascular: Hyperdense M1 MCA Skull: Normal. Negative for fracture or focal lesion. Sinuses/Orbits: No acute finding. Other: None ASPECTS ([place] Stroke Program Early CT Score) - Ganglionic level infarction (caudate, lentiform nuclei, internal capsule, insula, M1-M3 cortex): 4 - Supraganglionic infarction (M4-M6 cortex): 3 Total score (0-10 with 10 being normal): 7 IMPRESSION: 1. Mixed pattern of both acute and suspected subacute RIGHT hemisphere ischemia, in the setting of hyperdense RIGHT M1 MCA and acute LEFT-sided weakness, consistent with emergent large vessel occlusion of the RIGHT proximal MCA. No hemorrhage. 2. ASPECTS is 7. These results were  called by telephone at the time of interpretation on 02/10/2018 at 11:24 am to Dr. Francine Graven , who verbally acknowledged these results. Electronically Signed   By: Staci Righter M.D.   On: 02/10/2018 11:25   Vas [place] Lower Extremity Venous (dvt)  Result Date: 02/12/2018  Lower Venous Study Indications: Embolic stroke.  Limitations: Body habitus, bandages and immobility. Performing Technologist: Lorina Rabon  Examination Guidelines: A complete evaluation includes B-mode imaging, spectral Doppler, color Doppler, and power Doppler as needed of all accessible portions of each vessel. Bilateral testing is considered an integral part of a complete examination. Limited examinations for reoccurring indications may be performed as noted.  Right Venous Findings: +---------+---------------+---------+-----------+----------+-------+          CompressibilityPhasicitySpontaneityPropertiesSummary +---------+---------------+---------+-----------+----------+-------+ CFV      Full           Yes      Yes                          +---------+---------------+---------+-----------+----------+-------+ SFJ      Full                                                 +---------+---------------+---------+-----------+----------+-------+ FV Prox  Full                                                 +---------+---------------+---------+-----------+----------+-------+ FV Mid   Full                                                 +---------+---------------+---------+-----------+----------+-------+ FV DistalFull                                                 +---------+---------------+---------+-----------+----------+-------+ PFV      Full                                                 +---------+---------------+---------+-----------+----------+-------+ POP      Full           Yes      Yes                          +---------+---------------+---------+-----------+----------+-------+  PTV      Full                                                 +---------+---------------+---------+-----------+----------+-------+ PERO     Full                                                 +---------+---------------+---------+-----------+----------+-------+ A cystic structure with non-vascular feature seen at right popliteal fossa medial, measuring 2.54x3.24x0.85cm.  Left Venous Findings: +---------+---------------+---------+-----------+----------+-------+          CompressibilityPhasicitySpontaneityPropertiesSummary +---------+---------------+---------+-----------+----------+-------+ CFV      Full           Yes      Yes                          +---------+---------------+---------+-----------+----------+-------+ SFJ      Full                                                 +---------+---------------+---------+-----------+----------+-------+  FV Prox  Full                                                 +---------+---------------+---------+-----------+----------+-------+ FV Mid   Full                                                 +---------+---------------+---------+-----------+----------+-------+ FV DistalFull                                                 +---------+---------------+---------+-----------+----------+-------+ PFV      Full                                                 +---------+---------------+---------+-----------+----------+-------+ POP      Full           Yes      Yes                          +---------+---------------+---------+-----------+----------+-------+ PTV      Full                                                 +---------+---------------+---------+-----------+----------+-------+ PERO     Full                                                 +---------+---------------+---------+-----------+----------+-------+ A cystic structure with internal echoes, non-vascular feature seen at left popliteal fossa  medial, measuring 1.36x2.09x0.98cm.    Summary: Right: There is no evidence of deep vein thrombosis in the lower extremity. Left: There is no evidence of deep vein thrombosis in the lower extremity.  *See table(s) above for measurements and observations. Electronically signed by Ruta Hinds MD on 02/12/2018 at 9:14:26 AM.    Final     Labs:  CBC: Recent Labs    02/11/18 0447 02/12/18 1538 02/13/18 0436 02/14/18 0612  WBC 7.6 7.8 7.9 9.6  HGB 9.2* 8.8* 8.7* 8.9*  HCT 30.5* 27.8* 28.6* 29.3*  PLT 159 144* 131* 125*    COAGS: Recent Labs    06/03/17 1421 07/12/17 1759 02/10/18 1036  INR 1.23 1.07 1.17  APTT  --  27 32    BMP: Recent Labs    02/11/18 0447 02/12/18 1538 02/13/18 0436 02/14/18 0612  NA 142 141 143 141  K 3.8 3.5 3.5 3.4*  CL 113* 112* 114* 110  CO2 22 24 23 23   GLUCOSE 99 82 80 83  BUN 12 15 13 12   CALCIUM 8.4* 8.5* 8.4* 8.4*  CREATININE 1.31* 1.05* 1.07* 1.14*  GFRNONAA 39* 51* 49* 46*  GFRAA 45* 59* 57* 53*    LIVER FUNCTION TESTS: Recent  Labs    07/12/17 1759 07/13/17 0445 09/14/17 0215 02/10/18 1036  BILITOT 0.9 0.8 0.5 0.8  AST 24 17 13* 18  ALT 12* 10* 8* 14  ALKPHOS 56 52 54 54  PROT 6.1* 5.6* 6.3* 5.9*  ALBUMIN 3.4* 3.1* 3.4* 3.5    Assessment and Plan:  Right MCA M1 segment occlusion s/p revascularization using emergent mechanical thrombectomy 02/10/2018 by Dr. Estanislado Pandy. Patient's condition stable- no movement of left side and dysarthric speech. Continue taking Plavix 75 mg once daily and Aspirin 325 mg once daily. Plan to follow-up with Dr. Estanislado Pandy in clinic 6 weeks after discharge. Appreciate and agree with neurology management. IR to follow.   Electronically Signed: Earley Abide, PA-C 02/14/2018, 9:40 AM   I spent a total of 15 Minutes at the the patient's bedside AND on the patient's hospital floor or unit, greater than 50% of which was counseling/coordinating care for right MCA M1 segment occlusion s/p  revascularization.

## 2018-02-14 NOTE — PMR Pre-admission (Signed)
PMR Admission Coordinator Pre-Admission Assessment  Patient: Brandy [name] is an 77 y.o., female MRN: [id number] DOB: 1940/12/27 Height: _0  (162.6 cm) Weight: 68.5 kg              Insurance Information HMO:     PPO: Yes     PCP:      IPA:      80/20:      OTHER:  PRIMARY: UHC Medicare      Policy#: [id number]      Subscriber: Pateint CM Name: Dorthula Nettles      Phone#: [phone]     Fax#: [phone] Pre-Cert#: [id number]      Employer:  Josem Kaufmann provided by Abigail Butts at [facility] for admit to CIR on 02/16/18. Follow up CM is Dorthula Nettles with clinical update due in 7 days (02/22/18) Benefits:  Phone #: NA     Name: [email or web address] Eff. Date: 03/29/17     Deduct: $0      Out of Pocket Max: $4,000 (Met $1,801.03)      Life Max: NA CIR: $160/day for days 1-10; $0/day for days 11+      SNF: $0/day for days 1-20; $50/day for days 21-100; 100 day limit Outpatient: per necessity     Co-Pay: $20/visit Home Health: 100%; per necessity      Co-Pay:  DME: 80%     Co-Pay: 20% Providers:  SECONDARY:       Policy#:       Subscriber:  CM Name:       Phone#:      Fax#:  Pre-Cert#:       Employer:  Benefits:  Phone #:      Name:  Eff. Date:      Deduct:      Out of Pocket Max:       Life Max:  CIR:       SNF:  Outpatient:     Co-Pay:  Home Health:       Co-Pay:  DME:      Co-Pay:   Medicaid Application Date:       Case Manager:  Disability Application Date:       Case Worker:   Emergency Contact Information Contact Information    Name Relation Home Work Mobile   [place] [phone]  [phone]   No name specified         Current Medical History  Patient Admitting Diagnosis: Right MCA infarct with history of left MCA infarct.  History of Present Illness: Brandy [name] is a 77 year old right-handed female with history of cardiomyopathy, chronic combined systolic and diastolic congestive heart failure, hypertension,COPD/ tobacco abuse, CVA 06/2017 to [facility] maintained on  aspirin 162 mg daily. Per chart review patient lives with spouse. 2 level home with one step to entry. Reportedly independent prior to admission she still drives and prepares meals. She does have a housekeeper. Presented [facility] hospital with left-sided weakness and right gaze preference. Cranial CT scan showed mixed pattern of both acute suspected subacute right hemisphere ischemia in the setting of hyperdense right M1 MCA and acute left side weakness consistent with emergent large vessel occlusion on the right proximal MCA. CT angiogram head and neck acute right M1 MCA occlusion. Patient was transferred to [facility] underwent mechanical thrombectomy minimal recannulization of occluded right MCA M1 segment per interventional radiology.Follow-upMRI the brain showed large area infarction right MCA territory as well as petechial hemorrhage within the right basal ganglia. Echocardiogram  with ejection fraction of 40% diffuse hypokinesis. TEE completed 02/13/2018 with ejection fraction of 40% could not rule out early forming thrombus in the L AA versus artifact from posterior wall of the appendage. Multiple runs of nonsustained A. Tach and PACs were noted during procedure. Recommendations were for 30 day cardiac event monitor on discharge. Patient is maintained on aspirin and Plavix for CVA prophylaxis. Subcutaneous Lovenox for DVT prophylaxis. Dysphagia #1 thin liquid diet.Acute on chronic anemia 8.5 as well as platelets 120,000 and closely monitored. Therapy evaluations completed with recommendations of physical medicine rehabilitation consult. Patient is to be admitted for a comprehensive rehabilitation program on 02/16/18.  Complete NIHSS TOTAL: 8    Past Medical History  Past Medical History:  Diagnosis Date  . Cardiomyopathy    a. EF of 25% in 07/2006 b. EF normalized by repeat echo in 2011 c. EF 35-40% by echo in 05/2017 with cath showing mild nonobstructive CAD  . Chronic combined systolic  and diastolic CHF (congestive heart failure) ([place])   . Colon polyps   . Diverticulosis of colon   . Hypertension   . Hypertensive heart disease   . Osteopenia   . TIA (transient ischemic attack)   . Tobacco abuse    50 pack year    Family History  family history includes Cancer in her paternal grandmother; Hypertension in her brother and maternal grandmother.  Prior Rehab/Hospitalizations:  Has the patient had major surgery during 100 days prior to admission? No  Current Medications   Current Facility-Administered Medications:  .  0.9 %  sodium chloride infusion, , Intravenous, Continuous, Rosalin Hawking, MD, Last Rate: 40 mL/hr at 02/15/18 2050 .  acetaminophen (TYLENOL) tablet 650 mg, 650 mg, Oral, Q4H PRN, 650 mg at 02/15/18 2023 **OR** [DISCONTINUED] acetaminophen (TYLENOL) solution 650 mg, 650 mg, Per Tube, Q4H PRN **OR** acetaminophen (TYLENOL) suppository 650 mg, 650 mg, Rectal, Q4H PRN, Vonzella Nipple, NP, 650 mg at 02/14/18 0756 .  aspirin tablet 325 mg, 325 mg, Oral, Daily, Laurey Morale N, NP, 325 mg at 02/16/18 1031 .  atorvastatin (LIPITOR) tablet 10 mg, 10 mg, Oral, q1800, Vonzella Nipple, NP, 10 mg at 02/15/18 1711 .  carvedilol (COREG) tablet 12.5 mg, 12.5 mg, Oral, BID WC, Laurey Morale N, NP, 12.5 mg at 02/16/18 0831 .  chlorhexidine (PERIDEX) 0.12 % solution 15 mL, 15 mL, Mouth Rinse, BID, Laurey Morale N, NP, 15 mL at 02/16/18 1030 .  clopidogrel (PLAVIX) tablet 75 mg, 75 mg, Oral, Daily, 75 mg at 02/16/18 1031 **OR** clopidogrel (PLAVIX) tablet 75 mg, 75 mg, Per Tube, Daily, Laurey Morale N, NP .  donepezil (ARICEPT) tablet 5 mg, 5 mg, Oral, QHS, Rosalin Hawking, MD, 5 mg at 02/15/18 2316 .  enoxaparin (LOVENOX) injection 40 mg, 40 mg, Subcutaneous, Q24H, Williams, Jessica N, NP, 40 mg at 02/16/18 1031 .  labetalol (NORMODYNE,TRANDATE) injection 10-20 mg, 10-20 mg, Intravenous, Q2H PRN, Vonzella Nipple, NP .  MEDLINE mouth rinse, 15 mL, Mouth  Rinse, q12n4p, Vonzella Nipple, NP, 15 mL at 02/15/18 1712 .  senna-docusate (Senokot-S) tablet 1 tablet, 1 tablet, Oral, QHS PRN, Vonzella Nipple, NP .  umeclidinium-vilanterol (ANORO ELLIPTA) 62.5-25 MCG/INH 1 puff, 1 puff, Inhalation, Daily, Vonzella Nipple, NP, 1 puff at 02/14/18 1205  Patients Current Diet:  Diet Order            Diet - low sodium heart healthy        DIET - DYS 1 Room service appropriate?  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   Home Assistive Devices / Equipment Home Assistive Devices/Equipment: None Home Equipment: Shower seat  Prior Device Use: Indicate devices/aids used by the patient prior to current illness, exacerbation or injury? None of the above  Prior Functional Level Prior Function Level of Independence: Independent Comments: community ambulator, drives, she cooks and has a Medical sales representative Care: Did the patient need help bathing, dressing, using the toilet or eating?  Independent  Indoor Mobility: Did the patient need assistance with walking from room to room (with or without device)? Independent  Stairs: Did the patient need assistance with internal or external stairs (with or without device)? Independent  Functional Cognition: Did the patient need help planning regular tasks such as shopping or remembering to take medications? Independent  Current Functional Level Cognition  Arousal/Alertness: Lethargic Overall Cognitive Status: Impaired/Different from baseline Current Attention Level: Selective Orientation Level: Oriented X4 Safety/Judgement: Decreased awareness of  deficits General Comments: left inattention Attention: Sustained Sustained Attention: Impaired Sustained Attention Impairment: Verbal basic, Functional basic Awareness: Impaired Awareness Impairment: Intellectual impairment Problem Solving: Impaired Problem Solving Impairment: Verbal basic, Functional basic Executive Function: (all areas impacted by lower level deficits)    Extremity Assessment (includes Sensation/Coordination)  Upper Extremity Assessment: LUE deficits/detail LUE Deficits / Details: Lt UE flaccid.  PROM WFL.  Brunnstrom stage I UE and hand   Lower Extremity Assessment: Defer to PT evaluation LLE Deficits / Details: no AROM noted, PROM WFL    ADLs  Overall ADL's : Needs assistance/impaired Eating/Feeding: Minimal assistance, Sitting Grooming: Wash/dry hands, Wash/dry face, Minimal assistance, Sitting Grooming Details (indicate cue type and reason): sitting EOB with assistance to maintain balance while reaching with R UE  Upper Body Bathing: Maximal assistance, Bed level Lower Body Bathing: Maximal assistance, Bed level Upper Body Dressing : Total assistance, Sitting, Bed level Lower Body Dressing: Total assistance, Bed level Toilet Transfer: Maximal assistance Toileting- Clothing Manipulation and Hygiene: Total assistance, Bed level Functional mobility during ADLs: Maximal assistance(bed mobility only) General ADL Comments: pt engaged in EOB grooming tasks, with L lateral lean requiring max physical assist and verbal cueing     Mobility  Overal bed mobility: Needs Assistance Bed Mobility: Rolling, Sidelying to Sit, Sit to Sidelying Rolling: Max assist Sidelying to sit: Max assist Supine to sit: Max assist Sit to supine: Mod assist Sit to sidelying: Mod assist General bed mobility comments: pt able to roll towards R with max assist, transition from sidelying to sitting given cueing for technique and hand placement with max assist for trunk and LB support, mod  assist to transition back to sidelying for LB support only    Transfers  Overall transfer level: Needs assistance Transfers: Sit to/from Stand, Stand Pivot Transfers Sit to Stand: Mod assist, +2 physical assistance Stand pivot transfers: Max assist, +2 physical assistance General transfer comment: deferred due to fatigue    Ambulation / Gait / Stairs / Wheelchair Mobility  Ambulation/Gait General Gait Details: unable    Posture / Balance Dynamic Sitting Balance Sitting balance - Comments: mod-max assist to maintain midline posture at EOB with 0 hand support, but  demonstrates ability to maintain midline with 1 hand support given min guard Balance Overall balance assessment: Needs assistance Sitting-balance support: No upper extremity supported, Feet supported Sitting balance-Leahy Scale: Poor Sitting balance - Comments: mod-max assist to maintain midline posture at EOB with 0 hand support, but demonstrates ability to maintain midline with 1 hand support given min guard Postural control: Left lateral lean, Posterior lean Standing balance-Leahy Scale: Zero    Special needs/care consideration BiPAP/CPAP: no CPM: no Continuous Drip IV: 0.9% sodium chloride infusion  Dialysis: no        Days: no Life Vest: no Oxygen: 1.5L [place]  Special Bed: no Trach Size: no Wound Vac (area): no      Location: NA Skin: right groin incision                           Bowel mgmt:incontinent, last BM: 02/14/18 Bladder mgmt:external catheter in place Diabetic mgmt: no     Previous Home Environment Living Arrangements: Spouse/significant other, Children  Lives With: Spouse Available Help at Discharge: Family, Available 24 hours/day Type of Home: House Home Layout: Two level Alternate Level Stairs-Rails: Right, Left, Can reach both Alternate Level Stairs-Number of Steps: 12 steps to basement Home Access: Stairs to enter Entrance Stairs-Rails: None Entrance Stairs-Number of Steps: 1 Bathroom  Shower/Tub: Multimedia programmer: Programmer, systems: Yes Home Care Services: No  Discharge Living Setting Plans for Discharge Living Setting: Alone, Lives with (comment)(husband) Type of Home at Discharge: House Discharge Home Layout: One level Discharge Home Access: Stairs to enter Entrance Stairs-Rails: Right Entrance Stairs-Number of Steps: 2x3 steps Discharge Bathroom Shower/Tub: Tub/shower unit, Walk-in shower Discharge Bathroom Toilet: Standard Discharge Bathroom Accessibility: Yes How Accessible: Accessible via walker Does the patient have any problems obtaining your medications?: No  Social/Family/Support Systems Patient Roles: Spouse, Other (Comment)(has grown children) Contact Information: husband [name]): [phone]; cell [phone]; Daughter Deboraha Sprang): [phone]; borther Randel Pigg): [phone] Anticipated Caregiver: husband, daughter, family and friends Anticipated Caregiver's Contact Information: see above Ability/Limitations of Caregiver: Mod A Caregiver Availability: 24/7 Discharge Plan Discussed with Primary Caregiver: Yes Is Caregiver In Agreement with Plan?: Yes Does Caregiver/Family have Issues with Lodging/Transportation while Pt is in Rehab?: No   Goals/Additional Needs Patient/Family Goal for Rehab: PT/OT: Min/Mod A; SLP: Supervision/Min A Expected length of stay: 24-28 days Cultural Considerations: NA Dietary Needs: Dys 1; thin liquids Equipment Needs: TBD Additional Information: family will need caregiver training on transfers Pt/Family Agrees to Admission and willing to participate: Yes Program Orientation Provided & Reviewed with Pt/Caregiver Including Roles  & Responsibilities: Yes(with spouse, brother, and daughter)  Barriers to Discharge: Home environment access/layout, Other (comments)  Barriers to Discharge Comments: heavy assist anticipated; will need caregiver training on safe transfer techniques;  steps to enter home.    Decrease burden of Care through IP rehab admission: Family will need pt to be a 1 person assist to return home   Possible need for SNF placement upon discharge: Not anticipated; family has stated strong social support with planned 24/7 A at home after CIR. Pt has good and fair prognosis for further progress through CIR.    Patient Condition: This patient's medical and functional status has changed since the consult dated: 02/11/18 in which the Rehabilitation Physician determined and documented that the patient's condition is appropriate for intensive rehabilitative care in an inpatient rehabilitation facility. See "History of Present Illness" (above) for medical update. Functional changes are: progression in diet to Dys 1  with thin liquids, progression in full standing attempts with pt requiring Mod Ax2, and toleration of standing attempts up to 2 minutes. Patient's medical and functional status update has been discussed with the Rehabilitation physician and patient remains appropriate for inpatient rehabilitation. Pt has adequate caregiver support from family with husband, daughter and family/friends willing to provide 24/7 A at DC. Will admit to inpatient rehab today.  Preadmission Screen Completed By:  Jhonnie Garner, 02/16/2018 1:58 PM ______________________________________________________________________   Discussed status with Dr. Letta Pate on 02/16/18 at 1:58PM and received telephone approval for admission today.  Admission Coordinator:  Jhonnie Garner, time 1:58PM/Date 02/16/18

## 2018-02-14 NOTE — Progress Notes (Signed)
Physical Therapy Treatment Patient Details Name: Brandy [name] MRN: [id number] DOB: Oct 13, 1940 Today's Date: 02/14/2018    History of Present Illness 77 yo admitted with Left weakness and right gaze with Rt MCA CVA.  Mechanical thrombectomy attempted but failed- minimal recanalizatiobn of occluded RT MCA M1 seg. PMH significant for cardiomyopathy, TIA, HTN, CHF, Lt MCA stroke ( April 2019), and tobacco abuse     PT Comments    Pt more fatigued on arrival today with report of sleeping during the night but difficulty maintaining attention without direct stimuli. Pt with movement of LLE with noxious nail bed pressure today, remains unable to move actively and no response to light touch. Pt with improved ability with bed transitions and standing tolerance. Brother present throughout session. REcommend maxisky for nursing.     Follow Up Recommendations  CIR;Supervision/Assistance - 24 hour     Equipment Recommendations  Wheelchair (measurements PT);3in1 (PT)    Recommendations for Other Services OT consult;Speech consult;Rehab consult     Precautions / Restrictions Precautions Precautions: Fall Precaution Comments: mild Lt inattention, left hemiplegia Restrictions Weight Bearing Restrictions: No    Mobility  Bed Mobility Overal bed mobility: Needs Assistance Bed Mobility: Rolling;Sidelying to Sit Rolling: Min assist Sidelying to sit: Mod assist       General bed mobility comments: pt able to bend RLE and reach for rail to roll to left very limited min assist to roll to side. Mod assist to bring legs off of bed and elevate trunk from surface. Pt able to hook legs to bring legs off today  Transfers Overall transfer level: Needs assistance   Transfers: Sit to/from Stand;Stand Pivot Transfers Sit to Stand: Mod assist;+2 physical assistance Stand pivot transfers: Max assist;+2 physical assistance       General transfer comment: left knee blocked with support of LUE and  assist to rise from bed . PT with flexed trunk in standing and cues to extend bil LE with max reliance on blocking of LLE to prevent buckling. Max +2 assist to pivot to right with pt scooting foot with assist to pivot and with pt actually unweighting RLE significant buckle with support for standing. Grossly 2 min in standing  Ambulation/Gait             General Gait Details: unable   Stairs             Wheelchair Mobility    Modified Rankin (Stroke Patients Only) Modified Rankin (Stroke Patients Only) Pre-Morbid Rankin Score: No symptoms Modified Rankin: Severe disability     Balance Overall balance assessment: Needs assistance Sitting-balance support: Single extremity supported;Feet supported Sitting balance-Leahy Scale: Poor Sitting balance - Comments: mod-max assist EOB working on trunk extension, midline posture, anterior pelvic tilt, reaching and propping with RUE Postural control: Left lateral lean;Other (comment)(anterior)   Standing balance-Leahy Scale: Zero                              Cognition Arousal/Alertness: Awake/alert Behavior During Therapy: Flat affect Overall Cognitive Status: Impaired/Different from baseline Area of Impairment: Attention;Awareness;Problem solving                   Current Attention Level: Selective     Safety/Judgement: Decreased awareness of deficits Awareness: Intellectual Problem Solving: Decreased initiation;Difficulty sequencing;Requires verbal cues General Comments: left inattention      Exercises      General Comments        Pertinent  Vitals/Pain Pain Assessment: No/denies pain    Home Living                      Prior Function            PT Goals (current goals can now be found in the care plan section) Progress towards PT goals: Progressing toward goals    Frequency           PT Plan Current plan remains appropriate    Co-evaluation               AM-PAC PT "6 Clicks" Daily Activity  Outcome Measure  Difficulty turning over in bed (including adjusting bedclothes, sheets and blankets)?: Unable Difficulty moving from lying on back to sitting on the side of the bed? : Unable Difficulty sitting down on and standing up from a chair with arms (e.g., wheelchair, bedside commode, etc,.)?: Unable Help needed moving to and from a bed to chair (including a wheelchair)?: Total Help needed walking in hospital room?: Total Help needed climbing 3-5 steps with a railing? : Total 6 Click Score: 6    End of Session Equipment Utilized During Treatment: Gait belt Activity Tolerance: Patient tolerated treatment well Patient left: in chair;with call Seher/phone within reach;with chair alarm set;with family/visitor present Nurse Communication: Mobility status;Need for lift equipment;Precautions PT Visit Diagnosis: Other abnormalities of gait and mobility (R26.89);Other symptoms and signs involving the nervous system (R29.898);Hemiplegia and hemiparesis Hemiplegia - Right/Left: Left Hemiplegia - dominant/non-dominant: Non-dominant Hemiplegia - caused by: Cerebral infarction     Time: 0814-4818 PT Time Calculation (min) (ACUTE ONLY): 25 min  Charges:  $Therapeutic Activity: 8-22 mins $Neuromuscular Re-education: 8-22 mins                     [place], PT Acute Rehabilitation Services Pager: [phone] Office: [place] 02/14/2018, 11:02 AM

## 2018-02-14 NOTE — Progress Notes (Signed)
Inpatient Rehabilitation-Admissions Coordinator   Followed up with pt, her spouse, and her daughter today regarding CIR. AC reviewed program information with pt's daughter and stressed importance of finding 24/7 A at home after DC. AC reviewed options for caregiver support and AC described anticipated assist level and what that would look like. Family still working on finding support but anticipates having a plan in place by 1pm tomorrow. If family has adequate caregiver coverage, AC will begin insurance authorization process tomorrow.   Please call if questions.   Jhonnie Garner, OTR/L  Rehab Admissions Coordinator  [phone] 02/14/2018 3:52 PM

## 2018-02-14 NOTE — Progress Notes (Signed)
  Speech Language Pathology Treatment: Dysphagia  Patient Details Name: Brandy [name] MRN: [id number] DOB: 04/26/1940 Today's Date: 02/14/2018 Time: 5027-7412 SLP Time Calculation (min) (ACUTE ONLY): 11 min  Assessment / Plan / Recommendation Clinical Impression  Following MBSS, reviewed results with family (daughter and spouse) in room.  Provided education on anatomy and physiology of swallowing, diet recommendations, compensatory strategies, and treatment plan.  Reviewed imaging in room with rationale for swallowing strategies and diet modifications. Daughter extremely receptive verbalized swallow precautions after learning.    HPI HPI: 77 yo admitted with Left weakness and right gaze with Rt MCA CVA. Mechanical thrombectomy attempted but failed- minimal recanali77 yo admitted with Left weakness and right gaze with Rt MCA CVA. Mechanical thrombectomy attempted but failed- minimal recanalizatiobn of occluded RT MCA M1 seg. PMH significant for cardiomyopathy, TIA, HTN, CHF, Lt MCA stroke ( April 2019), and tobacco abuse zatiobn of occluded RT MCA M1 seg. PMH significant for cardiomyopathy, TIA, HTN, CHF, Lt MCA stroke ( April 2019), and tobacco abuse       SLP Plan  Continue with current plan of care       Recommendations  Diet recommendations: Dysphagia 1 (puree);Thin liquid Liquids provided via: No straw;Cup Medication Administration: Crushed with puree Supervision: Staff to assist with self feeding Compensations: Slow rate;Small sips/bites(Check oral cavity for food) Postural Changes and/or Swallow Maneuvers: Seated upright 90 degrees                Oral Care Recommendations: Oral care BID Follow up Recommendations: (Continue ST at next level of care) SLP Visit Diagnosis: Dysphagia, oropharyngeal phase (R13.12) Plan: Continue with current plan of care       [place], [place], [place] Office:  [phone] 02/14/2018, 2:59 PM

## 2018-02-14 NOTE — Progress Notes (Signed)
Modified Barium Swallow Progress Note  Patient Details  Name: Brandy [name] MRN: [id number] Date of Birth: [birth year]  Today's Date: 02/14/2018  Modified Barium Swallow completed.  Full report located under Chart Review in the Imaging Section.  Brief recommendations include the following:  Clinical Impression  Pt presents with moderate oral and mild pharyngeal dysphagia c/b decreased labial seal, reduced lingual strength and coordination, premature spillage, and delayed swallow initiation.  These deficits resulted in anterior spillage, decrease bolus formation and A-P transit, piecemeal deglutition, oral residue, and trace transient penetration of thin liquid by straw.  Cup sips prevented penetration of thin liquid but resulted in mild anterior loss of thin liquid.  Pt required prolonged oral phase with lingual pumping noted and significant oral residue. There was spillover of oral residuals after the swallow, which did not penetrate into laryngeal vestibule and was cleared with subsequent swallow. During pill simulation, pt was unable to transit tablet and it was removed from oral cavity with swab.  Recommend puree diet with thin liquid by cup.   Swallow Evaluation Recommendations       SLP Diet Recommendations: Dysphagia 1 (Puree) solids;Thin liquid   Liquid Administration via: No straw;Cup   Medication Administration: Crushed with puree   Supervision: Staff to assist with self feeding   Compensations: Slow rate;Small sips/bites(Check oral cavity for food)       Oral Care Recommendations: Oral care BID        Celedonio Savage, [place], [place] Office: [phone]; Pager (11/19): [phone] 02/14/2018,2:53 PM

## 2018-02-14 NOTE — Progress Notes (Signed)
Pt too sleepy at this time for inhaler.  Will check back later.

## 2018-02-15 ENCOUNTER — Inpatient Hospital Stay (HOSPITAL_COMMUNITY): Payer: Medicare Other

## 2018-02-15 ENCOUNTER — Encounter (HOSPITAL_COMMUNITY): Payer: Self-pay | Admitting: Radiology

## 2018-02-15 LAB — BASIC METABOLIC PANEL
Anion gap: 9 (ref 5–15)
BUN: 11 mg/dL (ref 8–23)
CO2: 21 mmol/L — ABNORMAL LOW (ref 22–32)
Calcium: 8.4 mg/dL — ABNORMAL LOW (ref 8.9–10.3)
Chloride: 111 mmol/L (ref 98–111)
Creatinine, Ser: 1.04 mg/dL — ABNORMAL HIGH (ref 0.44–1.00)
GFR calc Af Amer: 59 mL/min — ABNORMAL LOW (ref >60)
GFR, EST NON AFRICAN AMERICAN: 51 mL/min — AB (ref >60)
GLUCOSE: 82 mg/dL (ref 70–99)
Potassium: 3.2 mmol/L — ABNORMAL LOW (ref 3.5–5.1)
Sodium: 141 mmol/L (ref 135–145)

## 2018-02-15 LAB — CBC
HCT: 27.6 % — ABNORMAL LOW (ref 36.0–46.0)
Hemoglobin: 8.5 g/dL — ABNORMAL LOW (ref 12.0–15.0)
MCH: 27.8 pg (ref 26.0–34.0)
MCHC: 30.8 g/dL (ref 30.0–36.0)
MCV: 90.2 fL (ref 80.0–100.0)
NRBC: 0 % (ref 0.0–0.2)
PLATELETS: 120 10*3/uL — AB (ref 150–400)
RBC: 3.06 MIL/uL — ABNORMAL LOW (ref 3.87–5.11)
RDW: 14.5 % (ref 11.5–15.5)
WBC: 9 10*3/uL (ref 4.0–10.5)

## 2018-02-15 MED ORDER — POTASSIUM CHLORIDE CRYS ER 20 MEQ PO TBCR
40.0000 meq | EXTENDED_RELEASE_TABLET | ORAL | Status: AC
  Administered 2018-02-15 (×2): 40 meq via ORAL
  Filled 2018-02-15 (×2): qty 2

## 2018-02-15 NOTE — Progress Notes (Addendum)
Occupational Therapy Treatment Patient Details Name: Brandy [name] MRN: [id number] DOB: 1940-03-31 Today's Date: 02/15/2018    History of present illness 77 yo admitted with Left weakness and right gaze with Rt MCA CVA.  Mechanical thrombectomy attempted but failed- minimal recanalizatiobn of occluded RT MCA M1 seg. PMH significant for cardiomyopathy, TIA, HTN, CHF, Lt MCA stroke ( April 2019), and tobacco abuse    OT comments  Patient supine in bed, lethargic but agreeable to participation in OT.  Engaged in EOB grooming tasks, continued L lateral lean with mod-max assist (increased with fatigue) when completing tasks with R UE.  Requires increased time for processing to complete tasks, requires moderate cueing to visually scan towards L side to retrieve items.  Bed mobility with max assist.  Limited activity tolerance, fatigued today.  L UE provided with PROM and repositioned, flaccid. Will continue to follow.    I have discussed the patient's current level of function related to ADLs and mobility with the patient and spouse.  They acknowledge understanding of this and do not feel the patient would be able to have their care needs met at home.  They are interested in post-acute rehab in an inpatient setting, CIR level.     Follow Up Recommendations  CIR;Supervision/Assistance - 24 hour    Equipment Recommendations  None recommended by OT    Recommendations for Other Services Rehab consult    Precautions / Restrictions Precautions Precautions: Fall Precaution Comments: mild Lt inattention, left hemiplegia Restrictions Weight Bearing Restrictions: No       Mobility Bed Mobility Overal bed mobility: Needs Assistance Bed Mobility: Rolling;Sidelying to Sit;Sit to Sidelying Rolling: Max assist Sidelying to sit: Max assist     Sit to sidelying: Mod assist General bed mobility comments: pt able to roll towards R with max assist, transition from sidelying to sitting given cueing  for technique and hand placement with max assist for trunk and LB support, mod assist to transition back to sidelying for LB support only  Transfers                 General transfer comment: deferred due to fatigue    Balance Overall balance assessment: Needs assistance Sitting-balance support: No upper extremity supported;Feet supported Sitting balance-Leahy Scale: Poor Sitting balance - Comments: mod-max assist to maintain midline posture at EOB with 0 hand support, but demonstrates ability to maintain midline with 1 hand support given min guard Postural control: Left lateral lean;Posterior lean                                 ADL either performed or assessed with clinical judgement   ADL Overall ADL's : Needs assistance/impaired     Grooming: Wash/dry hands;Wash/dry face;Minimal assistance;Sitting Grooming Details (indicate cue type and reason): sitting EOB with assistance to maintain balance while reaching with R UE                              Functional mobility during ADLs: Maximal assistance(bed mobility only) General ADL Comments: pt engaged in EOB grooming tasks, with L lateral lean requiring max physical assist and verbal cueing      Vision   Additional Comments: continue to assess, increased cueing but able to scan towards L and locate items on bedside table    Perception     Praxis      Cognition Arousal/Alertness:  Awake/alert Behavior During Therapy: Flat affect Overall Cognitive Status: Impaired/Different from baseline Area of Impairment: Attention;Awareness;Problem solving                   Current Attention Level: Selective     Safety/Judgement: Decreased awareness of deficits Awareness: Intellectual Problem Solving: Decreased initiation;Difficulty sequencing;Requires verbal cues;Slow processing General Comments: left inattention        Exercises Exercises: Other exercises Other Exercises Other Exercises:  PROM of L UE from shoulder to hand, flaccid    Shoulder Instructions       General Comments spouse present and supportive    Pertinent Vitals/ Pain       Faces Pain Scale: No hurt  Home Living                                          Prior Functioning/Environment              Frequency  Min 2X/week        Progress Toward Goals  OT Goals(current goals can now be found in the care plan section)  Progress towards OT goals: Progressing toward goals  Acute Rehab OT Goals Patient Stated Goal: to get Lt UE working  OT Goal Formulation: With patient/family Time For Goal Achievement: 02/26/18 Potential to Achieve Goals: Good  Plan Discharge plan remains appropriate;Frequency remains appropriate    Co-evaluation                 AM-PAC PT "6 Clicks" Daily Activity     Outcome Measure   Help from another person eating meals?: A Little Help from another person taking care of personal grooming?: A Little Help from another person toileting, which includes using toliet, bedpan, or urinal?: A Lot Help from another person bathing (including washing, rinsing, drying)?: A Lot Help from another person to put on and taking off regular upper body clothing?: A Lot Help from another person to put on and taking off regular lower body clothing?: Total 6 Click Score: 13    End of Session    OT Visit Diagnosis: Hemiplegia and hemiparesis Hemiplegia - Right/Left: Left Hemiplegia - dominant/non-dominant: Non-Dominant Hemiplegia - caused by: Cerebral infarction   Activity Tolerance Patient limited by fatigue   Patient Left in bed;with call [place]/phone within reach;with family/visitor present;with bed alarm set;with nursing/sitter in room   Nurse Communication Mobility status        Time: 2423-5361 OT Time Calculation (min): 20 min  Charges: OT General Charges $OT Visit: 1 Visit OT Treatments $Self Care/Home Management : 8-22 mins  Delight Stare, OT Acute Rehabilitation Services Pager [phone] Office [phone]    Delight Stare 02/15/2018, 2:33 PM

## 2018-02-15 NOTE — Progress Notes (Addendum)
STROKE TEAM PROGRESS NOTE   SUBJECTIVE (INTERVAL HISTORY) Patient in bed, awake, more alert than yesterday. 2L [place]. Husband at bedside. Barium swallow completed yesterday: Dysphagia 1 recommended ( Puree solids); thin liquids no straw. 1300 family meeting with IR to determine if they can make the committment needed for IR.  OBJECTIVE Temp:  [97.8 F (36.6 C)-100.7 F (38.2 C)] 99.4 F (37.4 C) (11/20 0726) Pulse Rate:  [56-80] 80 (11/20 0726) Cardiac Rhythm: Normal sinus rhythm;Bundle branch block (11/19 1900) Resp:  [13-20] 18 (11/20 0726) BP: (133-147)/(63-93) 144/77 (11/20 0726) SpO2:  [98 %-100 %] 100 % (11/20 0726)  Recent Labs  Lab 02/10/18 1041  GLUCAP 75   Recent Labs  Lab 02/11/18 0447 02/12/18 1538 02/13/18 0436 02/14/18 0612 02/15/18 0424  NA 142 141 143 141 141  K 3.8 3.5 3.5 3.4* 3.2*  CL 113* 112* 114* 110 111  CO2 22 24 23 23  21*  GLUCOSE 99 82 80 83 82  BUN 12 15 13 12 11   CREATININE 1.31* 1.05* 1.07* 1.14* 1.04*  CALCIUM 8.4* 8.5* 8.4* 8.4* 8.4*   Recent Labs  Lab 02/10/18 1036  AST 18  ALT 14  ALKPHOS 54  BILITOT 0.8  PROT 5.9*  ALBUMIN 3.5   Recent Labs  Lab 02/10/18 1036  02/11/18 0447 02/12/18 1538 02/13/18 0436 02/14/18 0612 02/15/18 0424  WBC 4.8  --  7.6 7.8 7.9 9.6 9.0  NEUTROABS 2.8  --  6.8  --   --   --   --   HGB 11.3*   < > 9.2* 8.8* 8.7* 8.9* 8.5*  HCT 36.9   < > 30.5* 27.8* 28.6* 29.3* 27.6*  MCV 91.6  --  91.3 90.6 91.1 90.7 90.2  PLT 183  --  159 144* 131* 125* 120*   < > = values in this interval not displayed.    Recent Labs    02/14/18 1106  COLORURINE YELLOW  LABSPEC 1.019  PHURINE 5.0  GLUCOSEU NEGATIVE  HGBUR NEGATIVE  BILIRUBINUR NEGATIVE  KETONESUR 80*  PROTEINUR NEGATIVE  NITRITE NEGATIVE  LEUKOCYTESUR NEGATIVE       Component Value Date/Time   CHOL 93 02/11/2018 0447   TRIG 42 02/11/2018 0447   HDL 33 (L) 02/11/2018 0447   CHOLHDL 2.8 02/11/2018 0447   VLDL 8 02/11/2018 0447   LDLCALC 52  02/11/2018 0447   Lab Results  Component Value Date   HGBA1C 4.7 (L) 02/11/2018      Component Value Date/Time   LABOPIA NONE DETECTED 02/10/2018 2000   COCAINSCRNUR NONE DETECTED 02/10/2018 2000   LABBENZ NONE DETECTED 02/10/2018 2000   AMPHETMU NONE DETECTED 02/10/2018 2000   THCU NONE DETECTED 02/10/2018 2000   LABBARB NONE DETECTED 02/10/2018 2000    Recent Labs  Lab 02/10/18 1036  ETH <10    IMAGING  I have personally reviewed the radiological images below and agree with the radiology interpretations.  Ct Angio Head W Or Wo Contrast Ct Angio Neck W Or Wo Contrast Ct Cerebral Perfusion W Contrast 02/10/2018 IMPRESSION:  Acute RIGHT M1 MCA occlusion.  Fair to good collateralization. No extracranial stenosis/dissection or carotid siphon stenosis. Significant mismatch volume of 93 mL, encompassing much of the RIGHT MCA territory.   Ct Head Code Stroke Wo Contrast 02/10/2018 IMPRESSION:  1. Mixed pattern of both acute and suspected subacute RIGHT hemisphere ischemia, in the setting of hyperdense RIGHT M1 MCA and acute LEFT-sided weakness, consistent with emergent large vessel occlusion of the RIGHT proximal MCA.  No hemorrhage.  2. ASPECTS is 7.    Cerebral Angiogram - IR - Dr Estanislado Pandy 02/10/2018 S/P RT common carotid arteriogram,followed by x passes with embotrap 54mm x 45mm retriver device with minimal recanalizatiobn of occluded RT MCA M1 seg.  Focal  Neovascularity at site of occlusion ?moya moya like phenomenon due to progressive severe underlying ASVD stenosis.   Transthoracic Echocardiogram  Study Conclusions  - Left ventricle: The cavity size was moderately dilated. Wall   thickness was increased in a pattern of moderate LVH. The   estimated ejection fraction was 40%. Diffuse hypokinesis. Doppler   parameters are consistent with both elevated ventricular   end-diastolic filling pressure and elevated left atrial filling   pressure. - Mitral valve: There  was mild regurgitation. - Left atrium: The atrium was moderately dilated. - Atrial septum: No defect or patent foramen ovale was identified. - Pulmonary arteries: PA peak pressure: 41 mm Hg (S). - Pericardium, extracardiac: A trivial pericardial effusion was   identified. Impressions: - No cardiac source of emboli was indentified.   Mr Brain Wo Contrast  Result Date: 02/12/2018 CLINICAL DATA:  Stroke follow-up.  Right M1 occlusion. EXAM: MRI HEAD WITHOUT CONTRAST MRA HEAD WITHOUT CONTRAST TECHNIQUE: Multiplanar, multiecho pulse sequences of the brain and surrounding structures were obtained without intravenous contrast. Angiographic images of the head were obtained using MRA technique without contrast. COMPARISON:  Head CT/CTA 02/10/2018 FINDINGS: MRI HEAD FINDINGS BRAIN: There is multifocal diffusion restriction throughout the right MCA territory, predominantly within the right basal ganglia and insula. There is mild mass effect on the frontal horn of the right lateral ventricle. The midline structures are normal. There is cytotoxic edema within the right MCA distribution according to the areas ischemia. There is petechial hemorrhage within the right basal ganglia. The CSF spaces are normal for age, with no hydrocephalus. Susceptibility-sensitive sequences show no chronic microhemorrhage or superficial siderosis. SKULL AND UPPER CERVICAL SPINE: The visualized skull base, calvarium, upper cervical spine and extracranial soft tissues are normal. SINUSES/ORBITS: No fluid levels or advanced mucosal thickening. No mastoid or middle ear effusion. The orbits are normal. MRA HEAD FINDINGS Intracranial internal carotid arteries: Normal. Anterior cerebral arteries: Normal. Middle cerebral arteries: The M1 segment of the right middle cerebral artery remains occluded. There is return of flow related enhancement within the proximal M2 branches. Left MCA is normal. Posterior communicating arteries: Not visualized  Posterior cerebral arteries: Normal. Basilar artery: Normal. Vertebral arteries: Codominant.  Normal. Superior cerebellar arteries: Normal. Inferior cerebellar arteries: Normal. IMPRESSION: 1. Persistent occlusion of the right middle cerebral artery M1 segment. There is flow related enhancement within the right M2 branches. 2. Large area infarction within the right middle cerebral artery territory, predominantly involving the anterior insula and the basal ganglia. 3. Petechial hemorrhage within the right basal ganglia. Heidelberg classification 1b: HI2, confluent petechiae, no mass effect. Electronically Signed   By: Ulyses Jarred M.D.   On: 02/12/2018 00:25   Mr Jodene Nam Head Wo Contrast  Result Date: 02/12/2018 CLINICAL DATA:  Stroke follow-up.  Right M1 occlusion. EXAM: MRI HEAD WITHOUT CONTRAST MRA HEAD WITHOUT CONTRAST TECHNIQUE: Multiplanar, multiecho pulse sequences of the brain and surrounding structures were obtained without intravenous contrast. Angiographic images of the head were obtained using MRA technique without contrast. COMPARISON:  Head CT/CTA 02/10/2018 FINDINGS: MRI HEAD FINDINGS BRAIN: There is multifocal diffusion restriction throughout the right MCA territory, predominantly within the right basal ganglia and insula. There is mild mass effect on the frontal horn of the right  lateral ventricle. The midline structures are normal. There is cytotoxic edema within the right MCA distribution according to the areas ischemia. There is petechial hemorrhage within the right basal ganglia. The CSF spaces are normal for age, with no hydrocephalus. Susceptibility-sensitive sequences show no chronic microhemorrhage or superficial siderosis. SKULL AND UPPER CERVICAL SPINE: The visualized skull base, calvarium, upper cervical spine and extracranial soft tissues are normal. SINUSES/ORBITS: No fluid levels or advanced mucosal thickening. No mastoid or middle ear effusion. The orbits are normal. MRA HEAD  FINDINGS Intracranial internal carotid arteries: Normal. Anterior cerebral arteries: Normal. Middle cerebral arteries: The M1 segment of the right middle cerebral artery remains occluded. There is return of flow related enhancement within the proximal M2 branches. Left MCA is normal. Posterior communicating arteries: Not visualized Posterior cerebral arteries: Normal. Basilar artery: Normal. Vertebral arteries: Codominant.  Normal. Superior cerebellar arteries: Normal. Inferior cerebellar arteries: Normal. IMPRESSION: 1. Persistent occlusion of the right middle cerebral artery M1 segment. There is flow related enhancement within the right M2 branches. 2. Large area infarction within the right middle cerebral artery territory, predominantly involving the anterior insula and the basal ganglia. 3. Petechial hemorrhage within the right basal ganglia. Heidelberg classification 1b: HI2, confluent petechiae, no mass effect. Electronically Signed   By: Ulyses Jarred M.D.   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  The LA appendage is normal in size.  Cannot rule out early forming thrombus in the LA appendage vs. Artifact from posterior wall of appendage. Normal TV with mild TR Normal PV with trivial PR Normal MV with trivial MR Normal trileaflet AV Normal interatrial septum with no evidence of shunt by colorflow dopper or agitated saline contrast injection Mild atherosclerosis of the thoracic and ascending aorta. The patient had multiple runs of nonsustained atrial tachycardia and PACs during the procedure.  Dg Chest Port 1 View  Result Date: 02/15/2018 MPRESSION: Stable left basilar opacity as described above.    Dg Swallowing Func-speech Pathology  Result Date: 02/14/2018 Objective Swallowing Evaluation: Type  of Study: MBS-Modified Barium Swallow Study  Patient Details Name: BREEZIE MICUCCI MRN: [id number] Date of Birth: [birth year] Today's Date: 02/14/2018 Time: SLP Start Time (ACUTE ONLY): 1406 -SLP Stop Time (ACUTE ONLY): 1420 SLP Time Calculation (min) (ACUTE ONLY): 14 min Past Medical History: Past Medical History: Diagnosis Date . Cardiomyopathy   a. EF of 25% in 07/2006 b. EF normalized by repeat echo in 2011 c. EF 35-40% by echo in 05/2017 with cath showing mild nonobstructive CAD . Chronic combined systolic and diastolic CHF (congestive heart failure) ([place])  . Colon polyps  . Diverticulosis of colon  . Hypertension  . Hypertensive heart disease  . Osteopenia  . TIA (transient ischemic attack)  . Tobacco abuse   50 pack year Past Surgical History: Past Surgical History: Procedure Laterality Date . BREAST BIOPSY   . CARPAL TUNNEL RELEASE    right hand . CERVICAL FUSION   . CESAREAN SECTION    2 times . COLONOSCOPY  6712  pt uncertain as to whether polypectomy required or performed . ECTOPIC PREGNANCY SURGERY   . IR CT HEAD LTD  02/10/2018 . IR PERCUTANEOUS ART THROMBECTOMY/INFUSION INTRACRANIAL INC DIAG ANGIO  02/10/2018 . RADIOLOGY WITH ANESTHESIA N/A 02/10/2018  Procedure: RADIOLOGY WITH ANESTHESIA;  Surgeon: Luanne Bras,  MD;  Location: [facility];  Service: Radiology;  Laterality: N/A; . RIGHT/LEFT HEART CATH AND CORONARY ANGIOGRAPHY N/A 06/03/2017  Procedure: RIGHT/LEFT HEART CATH AND CORONARY ANGIOGRAPHY;  Surgeon: Jettie Booze, MD;  Location: [facility];  Service: Cardiovascular;  Laterality: N/A; . TONSILLECTOMY   HPI: 77 yo admitted with Left weakness and right gaze with Rt MCA CVA. Mechanical thrombectomy attempted but failed- minimal recanalizatiobn of occluded RT MCA M1 seg. PMH significant for cardiomyopathy, TIA, HTN, CHF, Lt MCA stroke ( April 2019), and tobacco abuse  Subjective: pt awake, alert, pleasant, participative Assessment / Plan / Recommendation CHL IP CLINICAL IMPRESSIONS  02/14/2018 Clinical Impression Pt presents with moderate oral and mild pharyngeal dysphagia c/b decreased labial seal, reduced lingual strength and coordination, premature spillage, and delayed swallow initiation.  These deficits resulted in anterior spillage, decrease bolus formation and A-P transit, piecemeal deglutition, oral residue, and trace transient penetration of thin liquid by straw.  Cup sips prevented penetration of thin liquid but resulted in mild anterior loss of thin liquid.  Pt required prolonged oral phase with lingual pumping noted and significant oral residue. There was spillover of oral residuals after the swallow, which did not penetrate into laryngeal vestibule and was cleared with subsequent swallow. During pill simulation, pt was unable to transit tablet and it was removed from oral cavity with swab.  Recommend puree diet with thin liquid by cup. SLP Visit Diagnosis Dysphagia, oropharyngeal phase (R13.12) Attention and concentration deficit following -- Frontal lobe and executive function deficit following -- Impact on safety and function Mild aspiration risk   CHL IP TREATMENT RECOMMENDATION 02/14/2018 Treatment Recommendations Therapy as outlined in treatment plan below   Prognosis 02/14/2018 Prognosis for Safe Diet Advancement Fair Barriers to Reach Goals -- Barriers/Prognosis Comment -- CHL IP DIET RECOMMENDATION 02/14/2018 SLP Diet Recommendations Dysphagia 1 (Puree) solids;Thin liquid Liquid Administration via No straw;Cup Medication Administration Crushed with puree Compensations Slow rate;Small sips/bites Postural Changes --   CHL IP OTHER RECOMMENDATIONS 02/14/2018 Recommended Consults -- Oral Care Recommendations Oral care BID Other Recommendations --   CHL IP FOLLOW UP RECOMMENDATIONS 02/14/2018 Follow up Recommendations (No Data)   CHL IP FREQUENCY AND DURATION 02/14/2018 Speech Therapy Frequency (ACUTE ONLY) min 2x/week Treatment Duration 2 weeks      CHL IP ORAL PHASE 02/14/2018  Oral Phase Impaired Oral - Pudding Teaspoon -- Oral - Pudding Cup -- Oral - Honey Teaspoon -- Oral - Honey Cup -- Oral - Nectar Teaspoon -- Oral - Nectar Cup -- Oral - Nectar Straw -- Oral - Thin Teaspoon -- Oral - Thin Cup Premature spillage;Right anterior bolus loss;Left anterior bolus loss Oral - Thin Straw Premature spillage Oral - Puree Lingual pumping;Piecemeal swallowing;Premature spillage;Delayed oral transit Oral - Mech Soft Lingual pumping;Piecemeal swallowing;Premature spillage;Delayed oral transit Oral - Regular Piecemeal swallowing;Premature spillage;Delayed oral transit;Lingual pumping Oral - Multi-Consistency -- Oral - Pill Delayed oral transit;Lingual pumping;Reduced posterior propulsion;Holding of bolus Oral Phase - Comment --  CHL IP PHARYNGEAL PHASE 02/14/2018 Pharyngeal Phase Impaired Pharyngeal- Pudding Teaspoon -- Pharyngeal -- Pharyngeal- Pudding Cup -- Pharyngeal -- Pharyngeal- Honey Teaspoon -- Pharyngeal -- Pharyngeal- Honey Cup -- Pharyngeal -- Pharyngeal- Nectar Teaspoon -- Pharyngeal -- Pharyngeal- Nectar Cup -- Pharyngeal -- Pharyngeal- Nectar Straw -- Pharyngeal -- Pharyngeal- Thin Teaspoon -- Pharyngeal -- Pharyngeal- Thin Cup Delayed swallow initiation-pyriform sinuses;Pharyngeal residue - valleculae;Pharyngeal residue - pyriform Pharyngeal Material does not enter airway Pharyngeal- Thin Straw Delayed swallow initiation-pyriform sinuses;Penetration/Aspiration before swallow;Pharyngeal residue - valleculae;Pharyngeal residue - pyriform Pharyngeal Material  enters airway, remains ABOVE vocal cords then ejected out Pharyngeal- Puree Delayed swallow initiation-vallecula Pharyngeal Material does not enter airway Pharyngeal- Mechanical Soft Delayed swallow initiation-vallecula Pharyngeal Material does not enter airway Pharyngeal- Regular Delayed swallow initiation-vallecula Pharyngeal Material does not enter airway Pharyngeal- Multi-consistency -- Pharyngeal -- Pharyngeal- Pill NT  Pharyngeal -- Pharyngeal Comment --  CHL IP CERVICAL ESOPHAGEAL PHASE 02/14/2018 Cervical Esophageal Phase WFL Pudding Teaspoon -- Pudding Cup -- Honey Teaspoon -- Honey Cup -- Nectar Teaspoon -- Nectar Cup -- Nectar Straw -- Thin Teaspoon -- Thin Cup -- Thin Straw -- Puree -- Mechanical Soft -- Regular -- Multi-consistency -- Pill -- Cervical Esophageal Comment -- Leigh E Borum 02/14/2018, 2:54 PM                PHYSICAL EXAM  General - Well nourished, well developed, drowsy, easily arouses to voice or light touch, not in distress.  Cardiovascular - Regular rate and rhythm. Lungs: diminshed breath sounds in left lobe. Right lobe CTA.  Neuro - drowsy but alert, easily arousable, with eyes open, right gaze preference but able to cross midline.Paucity of speech, no aphasia, able to name 2/2 and repeat but moderate dysarthria. PERRL, full field full but with left simultagnosia. Left facial droop and tongue protrusion midline. Left UE 0/5 and left LE trace withdraw with pain. RUE  4/5 and RLE 3/5. Left babinski positive. Sensation symmetrical, coordination not cooperative. Gait not tested.    ASSESSMENT/PLAN Ms. NYDIA YTUARTE is a 77 y.o. female with history of tobacco use, previous TIA, hypertension, cardiomyopathy with an EF of 25%, and congestive heart failure, admitted to [facility] with left-sided weakness and right gaze preference. No tPA given due to late presentation. Mechanical thrombectomy attempted but failed- minimal recanalizatiobn of occluded RT MCA M1 seg.   Stroke: R MCA infarct with right M1 occlusion failed IR, likely due to cardioembolic source  Resultant right gaze and the left hemiplegia  MRI - large right MCA infarcts including BG, CR and punctate cortical infarcts  MRA  - right M1 occlusion with some flow in right M2 branches  Head CT - right MCA infarcts with right M1 hyperdense signal  CTA H&N - Acute RIGHT M1 MCA occlusion, but previous left M2 occlusion  resolved  CTP - Significant mismatch volume of 93 mL, encompassing much of the RIGHT MCA territory.   IR attempted but failed to recanalize right M1  2D Echo - EF 40%  TEE - EF 40%, cannot rule out early forming thrombus in the LAA versus artifact from posterior wall of the appendage, multiple runs of nonsustained A. tach and PACs were noted during the procedure  30-day cardiac event monitoring at discharge  LDL - 52  HgbA1c - 4.7  UDS - negative  Lovenox for VTE prophylaxis  aspirin 81 mg daily prior to admission, now on aspirin 325 mg daily and clopidogrel 75 mg daily.   Therapy recommendations: CIR; earliest tomorrow; pending  Family meeting   Disposition: pending  History of stroke  06/2017 admitted in [place], left MCA infarct with left M2 occlusion and right M1 distal high-grade stenosis, EF 40%.  Put on aspirin 162 and a statin on discharge  This admission MRA showed right M1 occlusion but left M2 is patent now - indicating the embolic pattern  Cardiomyopathy  EF 35 to 40% in 05/2017  EF 40% in 06/2017  TTE and TEE EF 40% this admission  Has been followed with cardiology and on bidil and  coreg PTA  Coreg resumed  Not resumed bidil yet due to BP fluctuation.  Hypertension . Stable  . Resumed coreg, but not bidil yet.   Long term BP goal 130-150 given large vessel occlusion  Hyperlipidemia  Home meds:  Lipitor 10 mg daily  LDL 52, goal < 70  Resume Lipitor  Continue statin at discharge  Other Stroke Risk Factors  Advanced age  Former cigarette smoker - quit  CAD  Dysphagia  Secondary to stroke  dysphagia 1 diet  Low grade fever  Tmax 100.5->100.7  UA neg  CXR stable left basilar opacity  Continue to monitor  Other Active Problems  Post procedure anemia hemoglobin 8.9  Elevated Creatinine -1.14-  COPD  Thrombocytopenia: 144-125  Hospital day # [place], MSN, NP-C [facility]  Hospitalist [phone]   ATTENDING NOTE: I reviewed above note and agree with the assessment and plan. Pt was seen and examined.   Patient husband is at bedside.  Family is going to have meeting with rehab 1 PM today.  Patient lethargic but easily arousable, following commands.  Still has right gaze preference and left hemiplegia.  Had low-grade fever of 100.7 last night, but no fever today.  UA negative, CXR stable left basilar opacity, do not feel need antibiotics at this time.  PT/OT recommend CIR.  EP recommend 30-day CardioNet monitoring instead of loop recorder.  Rosalin Hawking, MD PhD Stroke Neurology 02/15/2018 2:28 PM       To contact Stroke Continuity provider, please refer to [email or web address] After hours, contact General Neurology

## 2018-02-15 NOTE — Progress Notes (Signed)
Inpatient Rehabilitation-Admissions Coordinator   [facility] met with pt and her family at the bedside to discuss final decision about post acute rehab venue. Family has chosen CIR. [name] husband and daughter feel they can provide the necessary support at DC from [facility] and have family and friends that have lined up and offered to provide the 24/7 Support. Family willing to be trained on transfers and how to best assist the patient at DC from CIR. AC has began insurance authorization process for possible admit.   Please call if questions.   Jhonnie Garner, OTR/L  Rehab Admissions Coordinator  [phone] 02/15/2018 2:35 PM

## 2018-02-15 NOTE — Plan of Care (Signed)
  Problem: Education: Goal: Knowledge of disease or condition will improve Outcome: Progressing Goal: Knowledge of secondary prevention will improve Outcome: Progressing   Problem: Coping: Goal: Will identify appropriate support needs Outcome: Progressing   Problem: Nutrition: Goal: Risk of aspiration will decrease Outcome: Progressing   Problem: Ischemic Stroke/TIA Tissue Perfusion: Goal: Complications of ischemic stroke/TIA will be minimized Outcome: Progressing   Problem: Activity: Goal: Risk for activity intolerance will decrease Outcome: Progressing   Problem: Nutrition: Goal: Adequate nutrition will be maintained Outcome: Progressing   Problem: Safety: Goal: Ability to remain free from injury will improve Outcome: Progressing   Problem: Skin Integrity: Goal: Risk for impaired skin integrity will decrease Outcome: Progressing

## 2018-02-15 NOTE — Progress Notes (Signed)
Referring Physician(s): CODE STROKE- Aroor, Lanice Schwab  Supervising Physician: Luanne Bras  Patient Status:  [facility] - In-pt  Chief Complaint: None  Subjective:  Right MCA M1 segment occlusion s/prevascularization usingemergentmechanical thrombectomy 02/10/2018 by Dr. Estanislado Pandy. Patient laying in bed, she is still lethargic but more responsive than yesterday. Accompanied by husband at bedside.. RN reports continued fever last evening. Still no spontaneous movement of left side. Speech still moderately dysarthric. Still with left facial droop/tongue protrusion to left. Right groin incision c/d/i.   Allergies: Lisinopril  Medications: Prior to Admission medications   Medication Sig Start Date End Date Taking? Authorizing Provider  aspirin EC 81 MG EC tablet Take 2 tablets (162 mg total) by mouth daily. 07/14/17  Yes Barton Dubois, MD  atorvastatin (LIPITOR) 10 MG tablet TAKE 1 TABLET BY MOUTH EVERY DAY AT 6 PM 09/12/17  Yes Herminio Commons, MD  BIDIL 20-37.5 MG tablet TAKE 1 TABLET BY MOUTH THREE TIMES DAILY 09/12/17  Yes Herminio Commons, MD  carvedilol (COREG) 12.5 MG tablet Take 1 tablet (12.5 mg total) by mouth 2 (two) times daily with a meal. 08/25/17  Yes Herminio Commons, MD  cetirizine (ZYRTEC) 10 MG tablet Take 10 mg by mouth daily as needed for allergies.    Yes [provider]  donepezil (ARICEPT) 5 MG tablet Take 1 tablet by mouth at bedtime. 11/15/17  Yes [provider]  furosemide (LASIX) 20 MG tablet Take 1 tablet (20 mg total) by mouth daily as needed for edema. 02/08/18 05/09/18 Yes Strader, Fransisco Hertz, PA-C  predniSONE (DELTASONE) 5 MG tablet Take 5 mg by mouth daily as needed (for swelling and right knee pain).   Yes [provider]  umeclidinium-vilanterol (ANORO ELLIPTA) 62.5-25 MCG/INH AEPB Inhale 1 puff into the lungs daily. 12/08/17  Yes Lauraine Rinne, NP     Vital Signs: BP (!) 144/77 (BP Location: Right Arm)    Pulse 80   Temp 99.4 F (37.4 C) (Oral)   Resp 18   Ht 5\' 4"  (1.626 m)   Wt 151 lb 0.2 oz (68.5 kg)   SpO2 100%   BMI 25.92 kg/m   Physical Exam  Constitutional: She appears well-developed and well-nourished. No distress.  Lethargic but more responsive than yesterday.  Pulmonary/Chest: Effort normal. No respiratory distress.  Neurological:  Lethargic but more responsive than yesterday. Demonstrates moderate dysarthric speech. PERRL bilaterally. EOMs intact bilaterally without nystagmus or subjective diplopia. Visual fields not assessed. Demonstrates left facial droop. Tongue deviates to left. Can spontaneously move right side but no spontaneous movements of left side. Pronator drift not assessed. Fine motor and coordination not assessed. Gait not assessed. Romberg not assessed. Heel to toe not assessed. Distal pulses 2+ bilaterally.  Skin: Skin is warm and dry.  Right groin incision soft without active bleeding or hematoma.  Psychiatric: She has a normal mood and affect. Her behavior is normal. Judgment and thought content normal.  Nursing note and vitals reviewed.   Imaging: Mr Brain Wo Contrast  Result Date: 02/12/2018 CLINICAL DATA:  Stroke follow-up.  Right M1 occlusion. EXAM: MRI HEAD WITHOUT CONTRAST MRA HEAD WITHOUT CONTRAST TECHNIQUE: Multiplanar, multiecho pulse sequences of the brain and surrounding structures were obtained without intravenous contrast. Angiographic images of the head were obtained using MRA technique without contrast. COMPARISON:  Head CT/CTA 02/10/2018 FINDINGS: MRI HEAD FINDINGS BRAIN: There is multifocal diffusion restriction throughout the right MCA territory, predominantly within the right basal ganglia and insula. There is mild mass  effect on the frontal horn of the right lateral ventricle. The midline structures are normal. There is cytotoxic edema within the right MCA distribution according to the areas ischemia. There is petechial  hemorrhage within the right basal ganglia. The CSF spaces are normal for age, with no hydrocephalus. Susceptibility-sensitive sequences show no chronic microhemorrhage or superficial siderosis. SKULL AND UPPER CERVICAL SPINE: The visualized skull base, calvarium, upper cervical spine and extracranial soft tissues are normal. SINUSES/ORBITS: No fluid levels or advanced mucosal thickening. No mastoid or middle ear effusion. The orbits are normal. MRA HEAD FINDINGS Intracranial internal carotid arteries: Normal. Anterior cerebral arteries: Normal. Middle cerebral arteries: The M1 segment of the right middle cerebral artery remains occluded. There is return of flow related enhancement within the proximal M2 branches. Left MCA is normal. Posterior communicating arteries: Not visualized Posterior cerebral arteries: Normal. Basilar artery: Normal. Vertebral arteries: Codominant.  Normal. Superior cerebellar arteries: Normal. Inferior cerebellar arteries: Normal. IMPRESSION: 1. Persistent occlusion of the right middle cerebral artery M1 segment. There is flow related enhancement within the right M2 branches. 2. Large area infarction within the right middle cerebral artery territory, predominantly involving the anterior insula and the basal ganglia. 3. Petechial hemorrhage within the right basal ganglia. Heidelberg classification 1b: HI2, confluent petechiae, no mass effect. Electronically Signed   By: Ulyses Jarred M.D.   On: 02/12/2018 00:25   Dg Chest Port 1 View  Result Date: 02/15/2018 CLINICAL DATA:  Fever. EXAM: PORTABLE CHEST 1 VIEW COMPARISON:  Radiograph of February 14, 2018. FINDINGS: Stable cardiomegaly. No pneumothorax is noted. Right lung is clear. Stable left basilar opacity is noted concerning for atelectasis or infiltrate with possible associated pleural effusion. Bony thorax is unremarkable. IMPRESSION: Stable left basilar opacity as described above. Electronically Signed   By: Marijo Conception, M.D.    On: 02/15/2018 09:23   Dg Chest Port 1 View  Result Date: 02/14/2018 CLINICAL DATA:  Fever. EXAM: PORTABLE CHEST 1 VIEW COMPARISON:  Radiographs of September 13, 2017. FINDINGS: Stable cardiomegaly. No pneumothorax is noted. Right lung is clear. Possible mild left basilar opacity is noted which may represent atelectasis or infiltrate with associated pleural effusion. Lateral radiograph may be performed further evaluation. Bony thorax is unremarkable. IMPRESSION: Possible mild left basilar atelectasis or infiltrate is noted with associated pleural effusion. Lateral projection is recommended for further evaluation. Electronically Signed   By: Marijo Conception, M.D.   On: 02/14/2018 07:40   Dg Swallowing Func-speech Pathology  Result Date: 02/14/2018 Objective Swallowing Evaluation: Type of Study: MBS-Modified Barium Swallow Study  Patient Details Name: PARKER SAWATZKY MRN: [id number] Date of Birth: 1940-07-16 Today's Date: 02/14/2018 Time: SLP Start Time (ACUTE ONLY): 1406 -SLP Stop Time (ACUTE ONLY): 1420 SLP Time Calculation (min) (ACUTE ONLY): 14 min Past Medical History: Past Medical History: Diagnosis Date . Cardiomyopathy   a. EF of 25% in 07/2006 b. EF normalized by repeat echo in 2011 c. EF 35-40% by echo in 05/2017 with cath showing mild nonobstructive CAD . Chronic combined systolic and diastolic CHF (congestive heart failure) ([place])  . Colon polyps  . Diverticulosis of colon  . Hypertension  . Hypertensive heart disease  . Osteopenia  . TIA (transient ischemic attack)  . Tobacco abuse   50 pack year Past Surgical History: Past Surgical History: Procedure Laterality Date . BREAST BIOPSY   . CARPAL TUNNEL RELEASE    right hand . CERVICAL FUSION   . CESAREAN SECTION    2 times . COLONOSCOPY  6283  pt uncertain as to whether polypectomy required or performed . ECTOPIC PREGNANCY SURGERY   . IR CT HEAD LTD  02/10/2018 . IR PERCUTANEOUS ART THROMBECTOMY/INFUSION INTRACRANIAL INC DIAG ANGIO  02/10/2018 . RADIOLOGY  WITH ANESTHESIA N/A 02/10/2018  Procedure: RADIOLOGY WITH ANESTHESIA;  Surgeon: Luanne Bras, MD;  Location: [place];  Service: Radiology;  Laterality: N/A; . RIGHT/LEFT HEART CATH AND CORONARY ANGIOGRAPHY N/A 06/03/2017  Procedure: RIGHT/LEFT HEART CATH AND CORONARY ANGIOGRAPHY;  Surgeon: Jettie Booze, MD;  Location: [place] [facility];  Service: Cardiovascular;  Laterality: N/A; . TONSILLECTOMY   HPI: 77 yo admitted with Left weakness and right gaze with Rt MCA CVA. Mechanical thrombectomy attempted but failed- minimal recanalizatiobn of occluded RT MCA M1 seg. PMH significant for cardiomyopathy, TIA, HTN, CHF, Lt MCA stroke ( April 2019), and tobacco abuse  Subjective: pt awake, alert, pleasant, participative Assessment / Plan / Recommendation CHL IP CLINICAL IMPRESSIONS 02/14/2018 Clinical Impression Pt presents with moderate oral and mild pharyngeal dysphagia c/b decreased labial seal, reduced lingual strength and coordination, premature spillage, and delayed swallow initiation.  These deficits resulted in anterior spillage, decrease bolus formation and A-P transit, piecemeal deglutition, oral residue, and trace transient penetration of thin liquid by straw.  Cup sips prevented penetration of thin liquid but resulted in mild anterior loss of thin liquid.  Pt required prolonged oral phase with lingual pumping noted and significant oral residue. There was spillover of oral residuals after the swallow, which did not penetrate into laryngeal vestibule and was cleared with subsequent swallow. During pill simulation, pt was unable to transit tablet and it was removed from oral cavity with swab.  Recommend puree diet with thin liquid by cup. SLP Visit Diagnosis Dysphagia, oropharyngeal phase (R13.12) Attention and concentration deficit following -- Frontal lobe and executive function deficit following -- Impact on safety and function Mild aspiration risk   CHL IP TREATMENT RECOMMENDATION 02/14/2018 Treatment  Recommendations Therapy as outlined in treatment plan below   Prognosis 02/14/2018 Prognosis for Safe Diet Advancement Fair Barriers to Reach Goals -- Barriers/Prognosis Comment -- CHL IP DIET RECOMMENDATION 02/14/2018 SLP Diet Recommendations Dysphagia 1 (Puree) solids;Thin liquid Liquid Administration via No straw;Cup Medication Administration Crushed with puree Compensations Slow rate;Small sips/bites Postural Changes --   CHL IP OTHER RECOMMENDATIONS 02/14/2018 Recommended Consults -- Oral Care Recommendations Oral care BID Other Recommendations --   CHL IP FOLLOW UP RECOMMENDATIONS 02/14/2018 Follow up Recommendations (No Data)   CHL IP FREQUENCY AND DURATION 02/14/2018 Speech Therapy Frequency (ACUTE ONLY) min 2x/week Treatment Duration 2 weeks      CHL IP ORAL PHASE 02/14/2018 Oral Phase Impaired Oral - Pudding Teaspoon -- Oral - Pudding Cup -- Oral - Honey Teaspoon -- Oral - Honey Cup -- Oral - Nectar Teaspoon -- Oral - Nectar Cup -- Oral - Nectar Straw -- Oral - Thin Teaspoon -- Oral - Thin Cup Premature spillage;Right anterior bolus loss;Left anterior bolus loss Oral - Thin Straw Premature spillage Oral - Puree Lingual pumping;Piecemeal swallowing;Premature spillage;Delayed oral transit Oral - Mech Soft Lingual pumping;Piecemeal swallowing;Premature spillage;Delayed oral transit Oral - Regular Piecemeal swallowing;Premature spillage;Delayed oral transit;Lingual pumping Oral - Multi-Consistency -- Oral - Pill Delayed oral transit;Lingual pumping;Reduced posterior propulsion;Holding of bolus Oral Phase - Comment --  CHL IP PHARYNGEAL PHASE 02/14/2018 Pharyngeal Phase Impaired Pharyngeal- Pudding Teaspoon -- Pharyngeal -- Pharyngeal- Pudding Cup -- Pharyngeal -- Pharyngeal- Honey Teaspoon -- Pharyngeal -- Pharyngeal- Honey Cup -- Pharyngeal -- Pharyngeal- Nectar Teaspoon -- Pharyngeal -- Pharyngeal- Nectar Cup --  Pharyngeal -- Pharyngeal- Nectar Straw -- Pharyngeal -- Pharyngeal- Thin Teaspoon -- Pharyngeal  -- Pharyngeal- Thin Cup Delayed swallow initiation-pyriform sinuses;Pharyngeal residue - valleculae;Pharyngeal residue - pyriform Pharyngeal Material does not enter airway Pharyngeal- Thin Straw Delayed swallow initiation-pyriform sinuses;Penetration/Aspiration before swallow;Pharyngeal residue - valleculae;Pharyngeal residue - pyriform Pharyngeal Material enters airway, remains ABOVE vocal cords then ejected out Pharyngeal- Puree Delayed swallow initiation-vallecula Pharyngeal Material does not enter airway Pharyngeal- Mechanical Soft Delayed swallow initiation-vallecula Pharyngeal Material does not enter airway Pharyngeal- Regular Delayed swallow initiation-vallecula Pharyngeal Material does not enter airway Pharyngeal- Multi-consistency -- Pharyngeal -- Pharyngeal- Pill NT Pharyngeal -- Pharyngeal Comment --  CHL IP CERVICAL ESOPHAGEAL PHASE 02/14/2018 Cervical Esophageal Phase WFL Pudding Teaspoon -- Pudding Cup -- Honey Teaspoon -- Honey Cup -- Nectar Teaspoon -- Nectar Cup -- Nectar Straw -- Thin Teaspoon -- Thin Cup -- Thin Straw -- Puree -- Mechanical Soft -- Regular -- Multi-consistency -- Pill -- Cervical Esophageal Comment -- Leigh E Borum 02/14/2018, 2:54 PM              Mr Jodene Nam Head Wo Contrast  Result Date: 02/12/2018 CLINICAL DATA:  Stroke follow-up.  Right M1 occlusion. EXAM: MRI HEAD WITHOUT CONTRAST MRA HEAD WITHOUT CONTRAST TECHNIQUE: Multiplanar, multiecho pulse sequences of the brain and surrounding structures were obtained without intravenous contrast. Angiographic images of the head were obtained using MRA technique without contrast. COMPARISON:  Head CT/CTA 02/10/2018 FINDINGS: MRI HEAD FINDINGS BRAIN: There is multifocal diffusion restriction throughout the right MCA territory, predominantly within the right basal ganglia and insula. There is mild mass effect on the frontal horn of the right lateral ventricle. The midline structures are normal. There is cytotoxic edema within the right  MCA distribution according to the areas ischemia. There is petechial hemorrhage within the right basal ganglia. The CSF spaces are normal for age, with no hydrocephalus. Susceptibility-sensitive sequences show no chronic microhemorrhage or superficial siderosis. SKULL AND UPPER CERVICAL SPINE: The visualized skull base, calvarium, upper cervical spine and extracranial soft tissues are normal. SINUSES/ORBITS: No fluid levels or advanced mucosal thickening. No mastoid or middle ear effusion. The orbits are normal. MRA HEAD FINDINGS Intracranial internal carotid arteries: Normal. Anterior cerebral arteries: Normal. Middle cerebral arteries: The M1 segment of the right middle cerebral artery remains occluded. There is return of flow related enhancement within the proximal M2 branches. Left MCA is normal. Posterior communicating arteries: Not visualized Posterior cerebral arteries: Normal. Basilar artery: Normal. Vertebral arteries: Codominant.  Normal. Superior cerebellar arteries: Normal. Inferior cerebellar arteries: Normal. IMPRESSION: 1. Persistent occlusion of the right middle cerebral artery M1 segment. There is flow related enhancement within the right M2 branches. 2. Large area infarction within the right middle cerebral artery territory, predominantly involving the anterior insula and the basal ganglia. 3. Petechial hemorrhage within the right basal ganglia. Heidelberg classification 1b: HI2, confluent petechiae, no mass effect. Electronically Signed   By: Ulyses Jarred M.D.   On: 02/12/2018 00:25   Vas [place] Lower Extremity Venous (dvt)  Result Date: 02/12/2018  Lower Venous Study Indications: Embolic stroke.  Limitations: Body habitus, bandages and immobility. Performing Technologist: Lorina Rabon  Examination Guidelines: A complete evaluation includes B-mode imaging, spectral Doppler, color Doppler, and power Doppler as needed of all accessible portions of each vessel. Bilateral testing is considered an  integral part of a complete examination. Limited examinations for reoccurring indications may be performed as noted.  Right Venous Findings: +---------+---------------+---------+-----------+----------+-------+          CompressibilityPhasicitySpontaneityPropertiesSummary +---------+---------------+---------+-----------+----------+-------+ CFV  Full           Yes      Yes                          +---------+---------------+---------+-----------+----------+-------+ SFJ      Full                                                 +---------+---------------+---------+-----------+----------+-------+ FV Prox  Full                                                 +---------+---------------+---------+-----------+----------+-------+ FV Mid   Full                                                 +---------+---------------+---------+-----------+----------+-------+ FV DistalFull                                                 +---------+---------------+---------+-----------+----------+-------+ PFV      Full                                                 +---------+---------------+---------+-----------+----------+-------+ POP      Full           Yes      Yes                          +---------+---------------+---------+-----------+----------+-------+ PTV      Full                                                 +---------+---------------+---------+-----------+----------+-------+ PERO     Full                                                 +---------+---------------+---------+-----------+----------+-------+ A cystic structure with non-vascular feature seen at right popliteal fossa medial, measuring 2.54x3.24x0.85cm.  Left Venous Findings: +---------+---------------+---------+-----------+----------+-------+          CompressibilityPhasicitySpontaneityPropertiesSummary +---------+---------------+---------+-----------+----------+-------+ CFV      Full            Yes      Yes                          +---------+---------------+---------+-----------+----------+-------+ SFJ      Full                                                 +---------+---------------+---------+-----------+----------+-------+  FV Prox  Full                                                 +---------+---------------+---------+-----------+----------+-------+ FV Mid   Full                                                 +---------+---------------+---------+-----------+----------+-------+ FV DistalFull                                                 +---------+---------------+---------+-----------+----------+-------+ PFV      Full                                                 +---------+---------------+---------+-----------+----------+-------+ POP      Full           Yes      Yes                          +---------+---------------+---------+-----------+----------+-------+ PTV      Full                                                 +---------+---------------+---------+-----------+----------+-------+ PERO     Full                                                 +---------+---------------+---------+-----------+----------+-------+ A cystic structure with internal echoes, non-vascular feature seen at left popliteal fossa medial, measuring 1.36x2.09x0.98cm.    Summary: Right: There is no evidence of deep vein thrombosis in the lower extremity. Left: There is no evidence of deep vein thrombosis in the lower extremity.  *See table(s) above for measurements and observations. Electronically signed by Ruta Hinds MD on 02/12/2018 at 9:14:26 AM.    Final     Labs:  CBC: Recent Labs    02/12/18 1538 02/13/18 0436 02/14/18 0612 02/15/18 0424  WBC 7.8 7.9 9.6 9.0  HGB 8.8* 8.7* 8.9* 8.5*  HCT 27.8* 28.6* 29.3* 27.6*  PLT 144* 131* 125* 120*    COAGS: Recent Labs    06/03/17 1421 07/12/17 1759 02/10/18 1036  INR 1.23 1.07 1.17    APTT  --  27 32    BMP: Recent Labs    02/12/18 1538 02/13/18 0436 02/14/18 0612 02/15/18 0424  NA 141 143 141 141  K 3.5 3.5 3.4* 3.2*  CL 112* 114* 110 111  CO2 24 23 23  21*  GLUCOSE 82 80 83 82  BUN 15 13 12 11   CALCIUM 8.5* 8.4* 8.4* 8.4*  CREATININE 1.05* 1.07* 1.14* 1.04*  GFRNONAA 51* 49* 46* 51*  GFRAA 59* 57* 53* 59*    LIVER FUNCTION TESTS:  Recent Labs    07/12/17 1759 07/13/17 0445 09/14/17 0215 02/10/18 1036  BILITOT 0.9 0.8 0.5 0.8  AST 24 17 13* 18  ALT 12* 10* 8* 14  ALKPHOS 56 52 54 54  PROT 6.1* 5.6* 6.3* 5.9*  ALBUMIN 3.4* 3.1* 3.4* 3.5    Assessment and Plan:  Right MCA M1 segment occlusion s/prevascularization usingemergentmechanical thrombectomy 02/10/2018 by Dr. Estanislado Pandy. Patient's condition stable- no movement of left side and dysarthric speech. Continue taking Plavix 75 mg once daily and Aspirin 325 mg once daily. Plan to follow-up with Dr. Estanislado Pandy in clinic 6 weeks after discharge. Appreciate and agree with neurology management. IR to follow.   Electronically Signed: Earley Abide, PA-C 02/15/2018, 10:15 AM   I spent a total of 15 Minutes at the the patient's bedside AND on the patient's hospital floor or unit, greater than 50% of which was counseling/coordinating care for right MCA M1 segment occlusion s/p revascularization.

## 2018-02-15 NOTE — Evaluation (Signed)
Speech Language Pathology Evaluation Patient Details Name: Brandy [name] MRN: [id number] DOB: 07-13-1940 Today's Date: 02/15/2018 Time: 7425-9563 SLP Time Calculation (min) (ACUTE ONLY): 16 min  Problem List:  Patient Active Problem List   Diagnosis Date Noted  . Acute embolic stroke ([place])   . Acute right MCA stroke ([place])   . History of CVA with residual deficit   . Benign essential HTN   . Chronic combined systolic and diastolic congestive heart failure ([place])   . History of fusion of cervical spine   . Stage 3 chronic kidney disease ([place])   . Acute blood loss anemia   . Stroke ([place]) 02/10/2018  . Middle cerebral artery embolism, right 02/10/2018  . Health care maintenance 11/01/2017  . Non-ischemic cardiomyopathy ([place]) 09/14/2017  . Precordial chest pain 09/14/2017  . B12 deficiency 09/13/2017  . Tachycardia 09/13/2017  . Acute ischemic stroke ([place])   . Seasonal allergies 07/13/2017  . TIA (transient ischemic attack) 07/12/2017  . Anemia 07/12/2017  . Pneumonia 06/02/2017  . Pleural effusion 06/02/2017  . Acute on chronic systolic congestive heart failure ([place])   . Elevated troponin   . Other fatigue 05/04/2017  . Dyspnea 05/04/2017  . Hypertensive heart disease   . Chronic combined systolic and diastolic CHF (congestive heart failure) ([place])   . Hypertension   . HTN (hypertension) 01/22/2013  . TOBACCO ABUSE 02/18/2008  . CARDIOMYOPATHY 02/18/2008  . DIVERTICULOSIS OF COLON 02/18/2008  . OSTEOPENIA 02/18/2008   Past Medical History:  Past Medical History:  Diagnosis Date  . Cardiomyopathy    a. EF of 25% in 07/2006 b. EF normalized by repeat echo in 2011 c. EF 35-40% by echo in 05/2017 with cath showing mild nonobstructive CAD  . Chronic combined systolic and diastolic CHF (congestive heart failure) ([place])   . Colon polyps   . Diverticulosis of colon   . Hypertension   . Hypertensive heart disease   . Osteopenia   . TIA (transient ischemic attack)   . Tobacco  abuse    50 pack year   Past Surgical History:  Past Surgical History:  Procedure Laterality Date  . BREAST BIOPSY    . CARPAL TUNNEL RELEASE     right hand  . CERVICAL FUSION    . CESAREAN SECTION     2 times  . COLONOSCOPY  [id number]   pt uncertain as to whether polypectomy required or performed  . ECTOPIC PREGNANCY SURGERY    . IR ANGIO VERTEBRAL SEL SUBCLAVIAN INNOMINATE UNI R MOD SED  02/10/2018  . IR CT HEAD LTD  02/10/2018  . IR PERCUTANEOUS ART THROMBECTOMY/INFUSION INTRACRANIAL INC DIAG ANGIO  02/10/2018  . RADIOLOGY WITH ANESTHESIA N/A 02/10/2018   Procedure: RADIOLOGY WITH ANESTHESIA;  Surgeon: Luanne Bras, MD;  Location: [facility];  Service: Radiology;  Laterality: N/A;  . RIGHT/LEFT HEART CATH AND CORONARY ANGIOGRAPHY N/A 06/03/2017   Procedure: RIGHT/LEFT HEART CATH AND CORONARY ANGIOGRAPHY;  Surgeon: Jettie Booze, MD;  Location: [facility];  Service: Cardiovascular;  Laterality: N/A;  . TONSILLECTOMY     HPI:  77 yo admitted with Left weakness and right gaze with Rt MCA CVA. Mechanical thrombectomy attempted but failed- minimal recanali77 yo admitted with Left weakness and right gaze with Rt MCA CVA. Mechanical thrombectomy attempted but failed- minimal recanalizatiobn of occluded RT MCA M1 seg. PMH significant for cardiomyopathy, TIA, HTN, CHF, Lt MCA stroke ( April 2019), and tobacco abuse zatiobn of occluded RT MCA M1 seg. PMH significant for  cardiomyopathy, TIA, HTN, CHF, Lt MCA stroke ( April 2019), and tobacco abuse    Assessment / Plan / Recommendation Clinical Impression  Pt presents with moderate flaccid dysarthria d/t significant left facial weakness. Her speech is c/b imprecise articulation and low vocal intesity that is further compromised by decreased vocal intensity. With Mod A cues, pt achieves ~ 50 % intelligibility at the phrase level. She primarily uses head gestures to answer questions. However pt primarily answers "yes" to most questions. Pt  also presents with significant cognitive deficits c/b decreased focused attention (alertness), decreased task initiation, inability to accuracy answer yes/no questions and difficulty with basic problem solving.Husband had question regarding "how long would it take for pt to get better?" Education given on stroke recovery and support provided. All question answered to husband's satisfaction. Skilled ST is required to target the above mentioned deficits, with CIR continues to be indicated to increase functional independence.     SLP Assessment  SLP Recommendation/Assessment: Patient needs continued Speech Lanaguage Pathology Services SLP Visit Diagnosis: Dysphagia, oropharyngeal phase (R13.12);Cognitive communication deficit (R41.841)    Follow Up Recommendations  Inpatient Rehab    Frequency and Duration min 2x/week  2 weeks      SLP Evaluation Cognition  Overall Cognitive Status: Impaired/Different from baseline Arousal/Alertness: Lethargic Orientation Level: Oriented X4 Attention: Sustained Sustained Attention: Impaired Sustained Attention Impairment: Verbal basic;Functional basic Awareness: Impaired Awareness Impairment: Intellectual impairment Problem Solving: Impaired Problem Solving Impairment: Verbal basic;Functional basic Executive Function: (all areas impacted by lower level deficits)       Comprehension  Auditory Comprehension Overall Auditory Comprehension: Impaired Yes/No Questions: Impaired Basic Biographical Questions: 26-50% accurate Basic Immediate Environment Questions: 25-49% accurate Commands: Impaired One Step Basic Commands: 0-24% accurate(likely d/t attention deficits) Interfering Components: Attention;Processing speed EffectiveTechniques: Repetition;Increased volume Visual Recognition/Discrimination Discrimination: Not tested Reading Comprehension Reading Status: Not tested    Expression Expression Primary Mode of Expression: Verbal Verbal  Expression Overall Verbal Expression: Impaired Initiation: Impaired Automatic Speech: Name Level of Generative/Spontaneous Verbalization: Phrase Repetition: Impaired Level of Impairment: Word level Non-Verbal Means of Communication: Not applicable Written Expression Dominant Hand: Right Written Expression: Not tested   Oral / Motor  Oral Motor/Sensory Function Overall Oral Motor/Sensory Function: Moderate impairment Facial ROM: Reduced left Facial Symmetry: Abnormal symmetry left Facial Strength: Reduced left Facial Sensation: Reduced left Lingual ROM: Reduced left Lingual Symmetry: Abnormal symmetry left Lingual Strength: Suspected CN XII (hypoglossal) dysfunction Motor Speech Overall Motor Speech: Impaired Respiration: Within functional limits Phonation: Low vocal intensity Resonance: Within functional limits Articulation: Impaired Level of Impairment: Word Intelligibility: Intelligibility reduced Word: 25-49% accurate Phrase: 25-49% accurate Sentence: 0-24% accurate Conversation: 0-24% accurate Motor Planning: Witnin functional limits Motor Speech Errors: Not applicable Effective Techniques: Increased vocal intensity   GO                    Sariyah Corcino 02/15/2018, 10:16 AM

## 2018-02-15 NOTE — Progress Notes (Signed)
  Speech Language Pathology Treatment: Dysphagia  Patient Details Name: Brandy [name] MRN: [id number] DOB: 19-Jun-1940 Today's Date: 02/15/2018 Time: 9244-6286 SLP Time Calculation (min) (ACUTE ONLY): 8 min  Assessment / Plan / Recommendation Clinical Impression  Skilled treatment session focused on consumption of trial dysphagia 2 texture. Pt with decreased attention to oral bolus as trials progressed which results in oral holding. Despite Max A cues, pt had difficulty sustaining attention to bolus which places her at risk of aspirating oral residue. When consuming thin liquids iva cup, pt able to contain bolus and prevent left anterior spillage. Education provided to husband on need for increased alertness and mentation as well as further trials of advanced diet textures before upgrade. He voiced understanding and agreement.     HPI HPI: 77 yo admitted with Left weakness and right gaze with Rt MCA CVA. Mechanical thrombectomy attempted but failed- minimal recanali77 yo admitted with Left weakness and right gaze with Rt MCA CVA. Mechanical thrombectomy attempted but failed- minimal recanalizatiobn of occluded RT MCA M1 seg. PMH significant for cardiomyopathy, TIA, HTN, CHF, Lt MCA stroke ( April 2019), and tobacco abuse zatiobn of occluded RT MCA M1 seg. PMH significant for cardiomyopathy, TIA, HTN, CHF, Lt MCA stroke ( April 2019), and tobacco abuse       SLP Plan  Continue with current plan of care  Patient needs continued Speech Lanaguage Pathology Services    Recommendations  Diet recommendations: Dysphagia 2 (fine chop);Thin liquid Liquids provided via: Cup Medication Administration: Crushed with puree Supervision: Staff to assist with self feeding Compensations: Slow rate;Small sips/bites Postural Changes and/or Swallow Maneuvers: Seated upright 90 degrees                General recommendations: Rehab consult Oral Care Recommendations: Oral care BID Follow up  Recommendations: Inpatient Rehab SLP Visit Diagnosis: Dysphagia, oropharyngeal phase (R13.12);Cognitive communication deficit (R41.841) Plan: Continue with current plan of care       [place] 02/15/2018, 10:55 AM

## 2018-02-16 ENCOUNTER — Inpatient Hospital Stay (HOSPITAL_COMMUNITY)
Admission: RE | Admit: 2018-02-16 | Discharge: 2018-03-16 | DRG: 057 | Disposition: A | Discharge status: Skilled Nursing Facility | Payer: Medicare Other | Source: Intra-hospital | Attending: Physical Medicine & Rehabilitation | Admitting: Physical Medicine & Rehabilitation

## 2018-02-16 ENCOUNTER — Other Ambulatory Visit: Payer: Self-pay | Admitting: Cardiology

## 2018-02-16 ENCOUNTER — Encounter (HOSPITAL_COMMUNITY): Payer: Self-pay | Admitting: Primary Care

## 2018-02-16 ENCOUNTER — Other Ambulatory Visit: Payer: Self-pay

## 2018-02-16 ENCOUNTER — Encounter (HOSPITAL_COMMUNITY): Payer: Self-pay | Admitting: Emergency Medicine

## 2018-02-16 DIAGNOSIS — E785 Hyperlipidemia, unspecified: Secondary | ICD-10-CM | POA: Diagnosis present

## 2018-02-16 DIAGNOSIS — Z7982 Long term (current) use of aspirin: Secondary | ICD-10-CM | POA: Diagnosis not present

## 2018-02-16 DIAGNOSIS — G8194 Hemiplegia, unspecified affecting left nondominant side: Secondary | ICD-10-CM

## 2018-02-16 DIAGNOSIS — E46 Unspecified protein-calorie malnutrition: Secondary | ICD-10-CM | POA: Diagnosis not present

## 2018-02-16 DIAGNOSIS — I69354 Hemiplegia and hemiparesis following cerebral infarction affecting left non-dominant side: Principal | ICD-10-CM

## 2018-02-16 DIAGNOSIS — I5042 Chronic combined systolic (congestive) and diastolic (congestive) heart failure: Secondary | ICD-10-CM | POA: Diagnosis present

## 2018-02-16 DIAGNOSIS — I429 Cardiomyopathy, unspecified: Secondary | ICD-10-CM | POA: Diagnosis present

## 2018-02-16 DIAGNOSIS — E8809 Other disorders of plasma-protein metabolism, not elsewhere classified: Secondary | ICD-10-CM | POA: Diagnosis present

## 2018-02-16 DIAGNOSIS — D649 Anemia, unspecified: Secondary | ICD-10-CM | POA: Diagnosis present

## 2018-02-16 DIAGNOSIS — I10 Essential (primary) hypertension: Secondary | ICD-10-CM | POA: Diagnosis not present

## 2018-02-16 DIAGNOSIS — J449 Chronic obstructive pulmonary disease, unspecified: Secondary | ICD-10-CM | POA: Diagnosis present

## 2018-02-16 DIAGNOSIS — N189 Chronic kidney disease, unspecified: Secondary | ICD-10-CM | POA: Diagnosis not present

## 2018-02-16 DIAGNOSIS — I2510 Atherosclerotic heart disease of native coronary artery without angina pectoris: Secondary | ICD-10-CM | POA: Diagnosis present

## 2018-02-16 DIAGNOSIS — E876 Hypokalemia: Secondary | ICD-10-CM

## 2018-02-16 DIAGNOSIS — N183 Chronic kidney disease, stage 3 unspecified: Secondary | ICD-10-CM | POA: Diagnosis present

## 2018-02-16 DIAGNOSIS — I639 Cerebral infarction, unspecified: Secondary | ICD-10-CM | POA: Diagnosis not present

## 2018-02-16 DIAGNOSIS — M109 Gout, unspecified: Secondary | ICD-10-CM | POA: Diagnosis present

## 2018-02-16 DIAGNOSIS — I130 Hypertensive heart and chronic kidney disease with heart failure and stage 1 through stage 4 chronic kidney disease, or unspecified chronic kidney disease: Secondary | ICD-10-CM | POA: Diagnosis present

## 2018-02-16 DIAGNOSIS — I63511 Cerebral infarction due to unspecified occlusion or stenosis of right middle cerebral artery: Secondary | ICD-10-CM

## 2018-02-16 DIAGNOSIS — N182 Chronic kidney disease, stage 2 (mild): Secondary | ICD-10-CM | POA: Diagnosis present

## 2018-02-16 DIAGNOSIS — Z87891 Personal history of nicotine dependence: Secondary | ICD-10-CM | POA: Diagnosis not present

## 2018-02-16 DIAGNOSIS — Z79899 Other long term (current) drug therapy: Secondary | ICD-10-CM

## 2018-02-16 DIAGNOSIS — I63311 Cerebral infarction due to thrombosis of right middle cerebral artery: Secondary | ICD-10-CM | POA: Diagnosis not present

## 2018-02-16 DIAGNOSIS — R414 Neurologic neglect syndrome: Secondary | ICD-10-CM | POA: Diagnosis present

## 2018-02-16 DIAGNOSIS — I471 Supraventricular tachycardia: Secondary | ICD-10-CM | POA: Diagnosis present

## 2018-02-16 LAB — CBC
HCT: 26.3 % — ABNORMAL LOW (ref 36.0–46.0)
Hemoglobin: 8.1 g/dL — ABNORMAL LOW (ref 12.0–15.0)
MCH: 28 pg (ref 26.0–34.0)
MCHC: 30.8 g/dL (ref 30.0–36.0)
MCV: 91 fL (ref 80.0–100.0)
PLATELETS: 139 10*3/uL — AB (ref 150–400)
RBC: 2.89 MIL/uL — ABNORMAL LOW (ref 3.87–5.11)
RDW: 14.6 % (ref 11.5–15.5)
WBC: 8.9 10*3/uL (ref 4.0–10.5)
nRBC: 0 % (ref 0.0–0.2)

## 2018-02-16 LAB — IRON AND TIBC
IRON: 11 ug/dL — AB (ref 28–170)
Saturation Ratios: 6 % — ABNORMAL LOW (ref 10.4–31.8)
TIBC: 195 ug/dL — ABNORMAL LOW (ref 250–450)
UIBC: 184 ug/dL

## 2018-02-16 LAB — BASIC METABOLIC PANEL
Anion gap: 4 — ABNORMAL LOW (ref 5–15)
BUN: 12 mg/dL (ref 8–23)
CO2: 23 mmol/L (ref 22–32)
CREATININE: 1.04 mg/dL — AB (ref 0.44–1.00)
Calcium: 8.4 mg/dL — ABNORMAL LOW (ref 8.9–10.3)
Chloride: 116 mmol/L — ABNORMAL HIGH (ref 98–111)
GFR calc Af Amer: 59 mL/min — ABNORMAL LOW (ref >60)
GFR, EST NON AFRICAN AMERICAN: 51 mL/min — AB (ref >60)
Glucose, Bld: 87 mg/dL (ref 70–99)
Potassium: 4.1 mmol/L (ref 3.5–5.1)
SODIUM: 143 mmol/L (ref 135–145)

## 2018-02-16 LAB — FERRITIN: Ferritin: 208 ng/mL (ref 11–307)

## 2018-02-16 LAB — VITAMIN B12: Vitamin B-12: 612 pg/mL (ref 180–914)

## 2018-02-16 LAB — FOLATE: Folate: 7.1 ng/mL (ref >5.9)

## 2018-02-16 MED ORDER — ENOXAPARIN SODIUM 40 MG/0.4ML ~~LOC~~ SOLN
40.0000 mg | SUBCUTANEOUS | Status: DC
  Administered 2018-02-17 – 2018-03-16 (×28): 40 mg via SUBCUTANEOUS
  Filled 2018-02-16 (×28): qty 0.4

## 2018-02-16 MED ORDER — ATORVASTATIN CALCIUM 10 MG PO TABS: 10.0000 mg | ORAL_TABLET | Freq: Every day | ORAL | 2 refills | Status: DC

## 2018-02-16 MED ORDER — ASPIRIN 325 MG PO TABS
325.0000 mg | ORAL_TABLET | Freq: Every day | ORAL | Status: DC
  Administered 2018-02-17 – 2018-03-16 (×28): 325 mg via ORAL
  Filled 2018-02-16 (×28): qty 1

## 2018-02-16 MED ORDER — DONEPEZIL HCL 5 MG PO TABS
5.0000 mg | ORAL_TABLET | Freq: Every day | ORAL | Status: DC
  Administered 2018-02-16 – 2018-03-15 (×28): 5 mg via ORAL
  Filled 2018-02-16 (×28): qty 1

## 2018-02-16 MED ORDER — SODIUM CHLORIDE 0.9 % IV SOLN
INTRAVENOUS | Status: DC
  Administered 2018-02-16: 21:00:00 via INTRAVENOUS

## 2018-02-16 MED ORDER — CLOPIDOGREL BISULFATE 75 MG PO TABS: 75.0000 mg | ORAL_TABLET | Freq: Every day | ORAL | Status: DC

## 2018-02-16 MED ORDER — CARVEDILOL 12.5 MG PO TABS
12.5000 mg | ORAL_TABLET | Freq: Two times a day (BID) | ORAL | Status: DC
  Administered 2018-02-17 – 2018-03-16 (×55): 12.5 mg via ORAL
  Filled 2018-02-16 (×56): qty 1

## 2018-02-16 MED ORDER — ACETAMINOPHEN 325 MG PO TABS
650.0000 mg | ORAL_TABLET | ORAL | Status: DC | PRN
  Administered 2018-02-23 – 2018-03-16 (×20): 650 mg via ORAL
  Filled 2018-02-16 (×22): qty 2

## 2018-02-16 MED ORDER — CLOPIDOGREL BISULFATE 75 MG PO TABS: 75.0000 mg | ORAL_TABLET | Freq: Every day | ORAL | 2 refills | Status: DC

## 2018-02-16 MED ORDER — ASPIRIN 325 MG PO TABS: 325.0000 mg | ORAL_TABLET | Freq: Every day | ORAL | Status: DC

## 2018-02-16 MED ORDER — ENOXAPARIN SODIUM 40 MG/0.4ML ~~LOC~~ SOLN: 40.0000 mg | SUBCUTANEOUS | Status: DC

## 2018-02-16 MED ORDER — UMECLIDINIUM-VILANTEROL 62.5-25 MCG/INH IN AEPB
1.0000 | INHALATION_SPRAY | Freq: Every day | RESPIRATORY_TRACT | Status: DC
  Administered 2018-02-17 – 2018-03-16 (×23): 1 via RESPIRATORY_TRACT
  Filled 2018-02-16 (×4): qty 14

## 2018-02-16 MED ORDER — ACETAMINOPHEN 650 MG RE SUPP: 650.0000 mg | RECTAL | Status: DC | PRN

## 2018-02-16 MED ORDER — SORBITOL 70 % SOLN
30.0000 mL | Freq: Every day | Status: DC | PRN
  Administered 2018-02-20 – 2018-03-02 (×4): 30 mL via ORAL
  Filled 2018-02-16 (×4): qty 30

## 2018-02-16 MED ORDER — SENNOSIDES-DOCUSATE SODIUM 8.6-50 MG PO TABS
1.0000 | ORAL_TABLET | Freq: Every evening | ORAL | Status: DC | PRN
  Administered 2018-02-20: 1 via ORAL
  Filled 2018-02-16: qty 1

## 2018-02-16 MED ORDER — ATORVASTATIN CALCIUM 10 MG PO TABS
10.0000 mg | ORAL_TABLET | Freq: Every day | ORAL | Status: DC
  Administered 2018-02-17 – 2018-03-15 (×27): 10 mg via ORAL
  Filled 2018-02-16 (×28): qty 1

## 2018-02-16 MED ORDER — CLOPIDOGREL BISULFATE 75 MG PO TABS
75.0000 mg | ORAL_TABLET | Freq: Every day | ORAL | Status: DC
  Administered 2018-02-17 – 2018-03-16 (×28): 75 mg via ORAL
  Filled 2018-02-16 (×28): qty 1

## 2018-02-16 NOTE — Progress Notes (Signed)
  Speech Language Pathology Treatment: Dysphagia;Cognitive-Linquistic  Patient Details Name: Brandy [name] MRN: [id number] DOB: 02-22-1941 Today's Date: 02/16/2018 Time: 9628-3662 SLP Time Calculation (min) (ACUTE ONLY): 16 min  Assessment / Plan / Recommendation Clinical Impression  Pt was seen for skilled ST targeting goals for dysarthria and dysphagia.  Pt was very drowsy upon therapist's arrival with increased difficulty containing her saliva in comparison to previous therapy notes as evidenced by almost constant spillage of saliva from the oral cavity.  Voice was also consistently wet throughout therapy session and pt needed mod-max cues to increase vocal intensity and clear wetness to achieve intelligibility at the simple sentence level.  Provided skilled education with pt's husband regarding techniques for improving management of secretions including cues to swallow her saliva and intermittently wiping face clean.  Pt had pooling of thin liquids in the left buccal cavity which spilled anteriorly post swallow both with and without use of the straw.  Straw sips did elicit delayed coughing whereas cup sips of thin liquids were tolerated without overt s/s of aspiration.  Pt has been running low grade fevers at night time.  Chest x ray yesterday indicated findings of "stable left basilar opacity concerning for atelectasis or infiltrate."  No temperature today and nursing reports no use of fever reducing agents.  For now, recommend that pt remain on currently prescribed diet with close ST follow up for management of diet toleration.    HPI HPI: 78 yo admitted with Left weakness and right gaze with Rt MCA CVA. Mechanical thrombectomy attempted but failed- minimal recanali77 yo admitted with Left weakness and right gaze with Rt MCA CVA. Mechanical thrombectomy attempted but failed- minimal recanalizatiobn of occluded RT MCA M1 seg. PMH significant for cardiomyopathy, TIA, HTN, CHF, Lt MCA stroke ( April  2019), and tobacco abuse zatiobn of occluded RT MCA M1 seg. PMH significant for cardiomyopathy, TIA, HTN, CHF, Lt MCA stroke ( April 2019), and tobacco abuse       SLP Plan  Continue with current plan of care       Recommendations  Diet recommendations: Dysphagia 1 (puree);Thin liquid Liquids provided via: Cup Medication Administration: Crushed with puree Supervision: Staff to assist with self feeding Compensations: Slow rate;Small sips/bites Postural Changes and/or Swallow Maneuvers: Seated upright 90 degrees                Oral Care Recommendations: Oral care BID Follow up Recommendations: Inpatient Rehab SLP Visit Diagnosis: Dysphagia, oropharyngeal phase (R13.12);Cognitive communication deficit (R41.841) Plan: Continue with current plan of care       GO                PageSelinda Orion 02/16/2018, 11:32 AM

## 2018-02-16 NOTE — Plan of Care (Signed)
  Problem: Consults Goal: RH STROKE PATIENT EDUCATION Description See Patient Education module for education specifics  Outcome: Not Progressing Goal: Nutrition Consult-if indicated Outcome: Not Progressing   Problem: RH BOWEL ELIMINATION Goal: RH STG MANAGE BOWEL WITH ASSISTANCE Description STG Manage Bowel with min Assistance.  Outcome: Not Progressing Goal: RH STG MANAGE BOWEL W/MEDICATION W/ASSISTANCE Description STG Manage Bowel with Medication with min Assistance.  Outcome: Not Progressing   Problem: RH BLADDER ELIMINATION Goal: RH STG MANAGE BLADDER WITH ASSISTANCE Description STG Manage Bladder With min Assistance  Outcome: Not Progressing Goal: RH STG MANAGE BLADDER WITH MEDICATION WITH ASSISTANCE Description STG Manage Bladder With Medication With min Assistance.  Outcome: Not Progressing   Problem: RH SKIN INTEGRITY Goal: RH STG SKIN FREE OF INFECTION/BREAKDOWN Outcome: Not Progressing Goal: RH STG MAINTAIN SKIN INTEGRITY WITH ASSISTANCE Description STG Maintain Skin Integrity With min Assistance.  Outcome: Not Progressing   Problem: RH SAFETY Goal: RH STG ADHERE TO SAFETY PRECAUTIONS W/ASSISTANCE/DEVICE Description STG Adhere to Safety Precautions With mod Assistance/Device.  Outcome: Not Progressing Goal: RH STG DECREASED RISK OF FALL WITH ASSISTANCE Description STG Decreased Risk of Fall With min/mod Assistance.  Outcome: Not Progressing   Problem: RH COGNITION-NURSING Goal: RH STG USES MEMORY AIDS/STRATEGIES W/ASSIST TO PROBLEM SOLVE Description STG Uses Memory Aids/Strategies With min Assistance to Problem Solve.  Outcome: Not Progressing Goal: RH STG ANTICIPATES NEEDS/CALLS FOR ASSIST W/ASSIST/CUES Description STG Anticipates Needs/Calls for Assist With min Assistance/Cues.  Outcome: Not Progressing   Problem: RH PAIN MANAGEMENT Goal: RH STG PAIN MANAGED AT OR BELOW [name] PAIN GOAL Outcome: Not Progressing   Problem: RH KNOWLEDGE  DEFICIT Goal: RH STG INCREASE KNOWLEDGE OF HYPERTENSION Outcome: Not Progressing Goal: RH STG INCREASE KNOWLEDGE OF DYSPHAGIA/FLUID INTAKE Outcome: Not Progressing Goal: RH STG INCREASE KNOWLEGDE OF HYPERLIPIDEMIA Outcome: Not Progressing Goal: RH STG INCREASE KNOWLEDGE OF STROKE PROPHYLAXIS Outcome: Not Progressing  New admit, unable to assess progression

## 2018-02-16 NOTE — Progress Notes (Addendum)
STROKE TEAM PROGRESS NOTE   SUBJECTIVE (INTERVAL HISTORY) Patient in bed, awake, alert. 2L [place]. Husband at bedside. Barium swallow completed yesterday: Dysphagia 1 recommended ( Puree solids); thin liquids no straw. CIR today pending insurance approval. Contacted Trish trent about 30 day cardiac monitor. OBJECTIVE Temp:  [97.7 F (36.5 C)-100.4 F (38 C)] 99.8 F (37.7 C) (11/21 0326) Pulse Rate:  [55-100] 75 (11/21 0804) Cardiac Rhythm: Normal sinus rhythm (11/21 0700) Resp:  [13-20] 13 (11/21 0804) BP: (117-134)/(59-75) 117/59 (11/21 0804) SpO2:  [100 %] 100 % (11/21 0804)  Recent Labs  Lab 02/10/18 1041  GLUCAP 75   Recent Labs  Lab 02/12/18 1538 02/13/18 0436 02/14/18 0612 02/15/18 0424 02/16/18 0400  NA 141 143 141 141 143  K 3.5 3.5 3.4* 3.2* 4.1  CL 112* 114* 110 111 116*  CO2 24 23 23  21* 23  GLUCOSE 82 80 83 82 87  BUN 15 13 12 11 12   CREATININE 1.05* 1.07* 1.14* 1.04* 1.04*  CALCIUM 8.5* 8.4* 8.4* 8.4* 8.4*   Recent Labs  Lab 02/10/18 1036  AST 18  ALT 14  ALKPHOS 54  BILITOT 0.8  PROT 5.9*  ALBUMIN 3.5   Recent Labs  Lab 02/10/18 1036  02/11/18 0447 02/12/18 1538 02/13/18 0436 02/14/18 0612 02/15/18 0424 02/16/18 0400  WBC 4.8  --  7.6 7.8 7.9 9.6 9.0 8.9  NEUTROABS 2.8  --  6.8  --   --   --   --   --   HGB 11.3*   < > 9.2* 8.8* 8.7* 8.9* 8.5* 8.1*  HCT 36.9   < > 30.5* 27.8* 28.6* 29.3* 27.6* 26.3*  MCV 91.6  --  91.3 90.6 91.1 90.7 90.2 91.0  PLT 183  --  159 144* 131* 125* 120* 139*   < > = values in this interval not displayed.    Recent Labs    02/14/18 1106  COLORURINE YELLOW  LABSPEC 1.019  PHURINE 5.0  GLUCOSEU NEGATIVE  HGBUR NEGATIVE  BILIRUBINUR NEGATIVE  KETONESUR 80*  PROTEINUR NEGATIVE  NITRITE NEGATIVE  LEUKOCYTESUR NEGATIVE       Component Value Date/Time   CHOL 93 02/11/2018 0447   TRIG 42 02/11/2018 0447   HDL 33 (L) 02/11/2018 0447   CHOLHDL 2.8 02/11/2018 0447   VLDL 8 02/11/2018 0447   LDLCALC 52  02/11/2018 0447   Lab Results  Component Value Date   HGBA1C 4.7 (L) 02/11/2018      Component Value Date/Time   LABOPIA NONE DETECTED 02/10/2018 2000   COCAINSCRNUR NONE DETECTED 02/10/2018 2000   LABBENZ NONE DETECTED 02/10/2018 2000   AMPHETMU NONE DETECTED 02/10/2018 2000   THCU NONE DETECTED 02/10/2018 2000   LABBARB NONE DETECTED 02/10/2018 2000    Recent Labs  Lab 02/10/18 1036  ETH <10    IMAGING  I have personally reviewed the radiological images below and agree with the radiology interpretations.  Ct Angio Head W Or Wo Contrast Ct Angio Neck W Or Wo Contrast Ct Cerebral Perfusion W Contrast 02/10/2018 IMPRESSION:  Acute RIGHT M1 MCA occlusion.  Fair to good collateralization. No extracranial stenosis/dissection or carotid siphon stenosis. Significant mismatch volume of 93 mL, encompassing much of the RIGHT MCA territory.   Ct Head Code Stroke Wo Contrast 02/10/2018 IMPRESSION:  1. Mixed pattern of both acute and suspected subacute RIGHT hemisphere ischemia, in the setting of hyperdense RIGHT M1 MCA and acute LEFT-sided weakness, consistent with emergent large vessel occlusion of the RIGHT proximal MCA.  No hemorrhage.  2. ASPECTS is 7.    Cerebral Angiogram - IR - Dr Estanislado Pandy 02/10/2018 S/P RT common carotid arteriogram,followed by x passes with embotrap 75mm x 45mm retriver device with minimal recanalizatiobn of occluded RT MCA M1 seg.  Focal  Neovascularity at site of occlusion ?moya moya like phenomenon due to progressive severe underlying ASVD stenosis.   Transthoracic Echocardiogram  Study Conclusions  - Left ventricle: The cavity size was moderately dilated. Wall   thickness was increased in a pattern of moderate LVH. The   estimated ejection fraction was 40%. Diffuse hypokinesis. Doppler   parameters are consistent with both elevated ventricular   end-diastolic filling pressure and elevated left atrial filling   pressure. - Mitral valve: There  was mild regurgitation. - Left atrium: The atrium was moderately dilated. - Atrial septum: No defect or patent foramen ovale was identified. - Pulmonary arteries: PA peak pressure: 41 mm Hg (S). - Pericardium, extracardiac: A trivial pericardial effusion was   identified. Impressions: - No cardiac source of emboli was indentified.   Mr Brain Wo Contrast  Result Date: 02/12/2018 CLINICAL DATA:  Stroke follow-up.  Right M1 occlusion. EXAM: MRI HEAD WITHOUT CONTRAST MRA HEAD WITHOUT CONTRAST TECHNIQUE: Multiplanar, multiecho pulse sequences of the brain and surrounding structures were obtained without intravenous contrast. Angiographic images of the head were obtained using MRA technique without contrast. COMPARISON:  Head CT/CTA 02/10/2018 FINDINGS: MRI HEAD FINDINGS BRAIN: There is multifocal diffusion restriction throughout the right MCA territory, predominantly within the right basal ganglia and insula. There is mild mass effect on the frontal horn of the right lateral ventricle. The midline structures are normal. There is cytotoxic edema within the right MCA distribution according to the areas ischemia. There is petechial hemorrhage within the right basal ganglia. The CSF spaces are normal for age, with no hydrocephalus. Susceptibility-sensitive sequences show no chronic microhemorrhage or superficial siderosis. SKULL AND UPPER CERVICAL SPINE: The visualized skull base, calvarium, upper cervical spine and extracranial soft tissues are normal. SINUSES/ORBITS: No fluid levels or advanced mucosal thickening. No mastoid or middle ear effusion. The orbits are normal. MRA HEAD FINDINGS Intracranial internal carotid arteries: Normal. Anterior cerebral arteries: Normal. Middle cerebral arteries: The M1 segment of the right middle cerebral artery remains occluded. There is return of flow related enhancement within the proximal M2 branches. Left MCA is normal. Posterior communicating arteries: Not visualized  Posterior cerebral arteries: Normal. Basilar artery: Normal. Vertebral arteries: Codominant.  Normal. Superior cerebellar arteries: Normal. Inferior cerebellar arteries: Normal. IMPRESSION: 1. Persistent occlusion of the right middle cerebral artery M1 segment. There is flow related enhancement within the right M2 branches. 2. Large area infarction within the right middle cerebral artery territory, predominantly involving the anterior insula and the basal ganglia. 3. Petechial hemorrhage within the right basal ganglia. Heidelberg classification 1b: HI2, confluent petechiae, no mass effect. Electronically Signed   By: Ulyses Jarred M.D.   On: 02/12/2018 00:25   Mr Jodene Nam Head Wo Contrast  Result Date: 02/12/2018 CLINICAL DATA:  Stroke follow-up.  Right M1 occlusion. EXAM: MRI HEAD WITHOUT CONTRAST MRA HEAD WITHOUT CONTRAST TECHNIQUE: Multiplanar, multiecho pulse sequences of the brain and surrounding structures were obtained without intravenous contrast. Angiographic images of the head were obtained using MRA technique without contrast. COMPARISON:  Head CT/CTA 02/10/2018 FINDINGS: MRI HEAD FINDINGS BRAIN: There is multifocal diffusion restriction throughout the right MCA territory, predominantly within the right basal ganglia and insula. There is mild mass effect on the frontal horn of the right  lateral ventricle. The midline structures are normal. There is cytotoxic edema within the right MCA distribution according to the areas ischemia. There is petechial hemorrhage within the right basal ganglia. The CSF spaces are normal for age, with no hydrocephalus. Susceptibility-sensitive sequences show no chronic microhemorrhage or superficial siderosis. SKULL AND UPPER CERVICAL SPINE: The visualized skull base, calvarium, upper cervical spine and extracranial soft tissues are normal. SINUSES/ORBITS: No fluid levels or advanced mucosal thickening. No mastoid or middle ear effusion. The orbits are normal. MRA HEAD  FINDINGS Intracranial internal carotid arteries: Normal. Anterior cerebral arteries: Normal. Middle cerebral arteries: The M1 segment of the right middle cerebral artery remains occluded. There is return of flow related enhancement within the proximal M2 branches. Left MCA is normal. Posterior communicating arteries: Not visualized Posterior cerebral arteries: Normal. Basilar artery: Normal. Vertebral arteries: Codominant.  Normal. Superior cerebellar arteries: Normal. Inferior cerebellar arteries: Normal. IMPRESSION: 1. Persistent occlusion of the right middle cerebral artery M1 segment. There is flow related enhancement within the right M2 branches. 2. Large area infarction within the right middle cerebral artery territory, predominantly involving the anterior insula and the basal ganglia. 3. Petechial hemorrhage within the right basal ganglia. Heidelberg classification 1b: HI2, confluent petechiae, no mass effect. Electronically Signed   By: Ulyses Jarred M.D.   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  The LA appendage is normal in size.  Cannot rule out early forming thrombus in the LA appendage vs. Artifact from posterior wall of appendage. Normal TV with mild TR Normal PV with trivial PR Normal MV with trivial MR Normal trileaflet AV Normal interatrial septum with no evidence of shunt by colorflow dopper or agitated saline contrast injection Mild atherosclerosis of the thoracic and ascending aorta. The patient had multiple runs of nonsustained atrial tachycardia and PACs during the procedure.  Dg Chest Port 1 View  Result Date: 02/15/2018 MPRESSION: Stable left basilar opacity as described above.    Dg Swallowing Func-speech Pathology  Result Date: 02/14/2018 Objective Swallowing Evaluation: Type  of Study: MBS-Modified Barium Swallow Study  Patient Details Name: Brandy [name] MRN: [id number] Date of Birth: 1941/01/26 Today's Date: 02/14/2018 Time: SLP Start Time (ACUTE ONLY): 1406 -SLP Stop Time (ACUTE ONLY): 1420 SLP Time Calculation (min) (ACUTE ONLY): 14 min Past Medical History: Past Medical History: Diagnosis Date . Cardiomyopathy   a. EF of 25% in 07/2006 b. EF normalized by repeat echo in 2011 c. EF 35-40% by echo in 05/2017 with cath showing mild nonobstructive CAD . Chronic combined systolic and diastolic CHF (congestive heart failure) ([place])  . Colon polyps  . Diverticulosis of colon  . Hypertension  . Hypertensive heart disease  . Osteopenia  . TIA (transient ischemic attack)  . Tobacco abuse   50 pack year Past Surgical History: Past Surgical History: Procedure Laterality Date . BREAST BIOPSY   . CARPAL TUNNEL RELEASE    right hand . CERVICAL FUSION   . CESAREAN SECTION    2 times . COLONOSCOPY  7829  pt uncertain as to whether polypectomy required or performed . ECTOPIC PREGNANCY SURGERY   . IR CT HEAD LTD  02/10/2018 . IR PERCUTANEOUS ART THROMBECTOMY/INFUSION INTRACRANIAL INC DIAG ANGIO  02/10/2018 . RADIOLOGY WITH ANESTHESIA N/A 02/10/2018  Procedure: RADIOLOGY WITH ANESTHESIA;  Surgeon: Luanne Bras,  MD;  Location: [facility];  Service: Radiology;  Laterality: N/A; . RIGHT/LEFT HEART CATH AND CORONARY ANGIOGRAPHY N/A 06/03/2017  Procedure: RIGHT/LEFT HEART CATH AND CORONARY ANGIOGRAPHY;  Surgeon: Jettie Booze, MD;  Location: [facility];  Service: Cardiovascular;  Laterality: N/A; . TONSILLECTOMY   HPI: 77 yo admitted with Left weakness and right gaze with Rt MCA CVA. Mechanical thrombectomy attempted but failed- minimal recanalizatiobn of occluded RT MCA M1 seg. PMH significant for cardiomyopathy, TIA, HTN, CHF, Lt MCA stroke ( April 2019), and tobacco abuse  Subjective: pt awake, alert, pleasant, participative Assessment / Plan / Recommendation CHL IP CLINICAL IMPRESSIONS  02/14/2018 Clinical Impression Pt presents with moderate oral and mild pharyngeal dysphagia c/b decreased labial seal, reduced lingual strength and coordination, premature spillage, and delayed swallow initiation.  These deficits resulted in anterior spillage, decrease bolus formation and A-P transit, piecemeal deglutition, oral residue, and trace transient penetration of thin liquid by straw.  Cup sips prevented penetration of thin liquid but resulted in mild anterior loss of thin liquid.  Pt required prolonged oral phase with lingual pumping noted and significant oral residue. There was spillover of oral residuals after the swallow, which did not penetrate into laryngeal vestibule and was cleared with subsequent swallow. During pill simulation, pt was unable to transit tablet and it was removed from oral cavity with swab.  Recommend puree diet with thin liquid by cup. SLP Visit Diagnosis Dysphagia, oropharyngeal phase (R13.12) Attention and concentration deficit following -- Frontal lobe and executive function deficit following -- Impact on safety and function Mild aspiration risk   CHL IP TREATMENT RECOMMENDATION 02/14/2018 Treatment Recommendations Therapy as outlined in treatment plan below   Prognosis 02/14/2018 Prognosis for Safe Diet Advancement Fair Barriers to Reach Goals -- Barriers/Prognosis Comment -- CHL IP DIET RECOMMENDATION 02/14/2018 SLP Diet Recommendations Dysphagia 1 (Puree) solids;Thin liquid Liquid Administration via No straw;Cup Medication Administration Crushed with puree Compensations Slow rate;Small sips/bites Postural Changes --   CHL IP OTHER RECOMMENDATIONS 02/14/2018 Recommended Consults -- Oral Care Recommendations Oral care BID Other Recommendations --   CHL IP FOLLOW UP RECOMMENDATIONS 02/14/2018 Follow up Recommendations (No Data)   CHL IP FREQUENCY AND DURATION 02/14/2018 Speech Therapy Frequency (ACUTE ONLY) min 2x/week Treatment Duration 2 weeks      CHL IP ORAL PHASE 02/14/2018  Oral Phase Impaired Oral - Pudding Teaspoon -- Oral - Pudding Cup -- Oral - Honey Teaspoon -- Oral - Honey Cup -- Oral - Nectar Teaspoon -- Oral - Nectar Cup -- Oral - Nectar Straw -- Oral - Thin Teaspoon -- Oral - Thin Cup Premature spillage;Right anterior bolus loss;Left anterior bolus loss Oral - Thin Straw Premature spillage Oral - Puree Lingual pumping;Piecemeal swallowing;Premature spillage;Delayed oral transit Oral - Mech Soft Lingual pumping;Piecemeal swallowing;Premature spillage;Delayed oral transit Oral - Regular Piecemeal swallowing;Premature spillage;Delayed oral transit;Lingual pumping Oral - Multi-Consistency -- Oral - Pill Delayed oral transit;Lingual pumping;Reduced posterior propulsion;Holding of bolus Oral Phase - Comment --  CHL IP PHARYNGEAL PHASE 02/14/2018 Pharyngeal Phase Impaired Pharyngeal- Pudding Teaspoon -- Pharyngeal -- Pharyngeal- Pudding Cup -- Pharyngeal -- Pharyngeal- Honey Teaspoon -- Pharyngeal -- Pharyngeal- Honey Cup -- Pharyngeal -- Pharyngeal- Nectar Teaspoon -- Pharyngeal -- Pharyngeal- Nectar Cup -- Pharyngeal -- Pharyngeal- Nectar Straw -- Pharyngeal -- Pharyngeal- Thin Teaspoon -- Pharyngeal -- Pharyngeal- Thin Cup Delayed swallow initiation-pyriform sinuses;Pharyngeal residue - valleculae;Pharyngeal residue - pyriform Pharyngeal Material does not enter airway Pharyngeal- Thin Straw Delayed swallow initiation-pyriform sinuses;Penetration/Aspiration before swallow;Pharyngeal residue - valleculae;Pharyngeal residue - pyriform Pharyngeal Material  enters airway, remains ABOVE vocal cords then ejected out Pharyngeal- Puree Delayed swallow initiation-vallecula Pharyngeal Material does not enter airway Pharyngeal- Mechanical Soft Delayed swallow initiation-vallecula Pharyngeal Material does not enter airway Pharyngeal- Regular Delayed swallow initiation-vallecula Pharyngeal Material does not enter airway Pharyngeal- Multi-consistency -- Pharyngeal -- Pharyngeal- Pill NT  Pharyngeal -- Pharyngeal Comment --  CHL IP CERVICAL ESOPHAGEAL PHASE 02/14/2018 Cervical Esophageal Phase WFL Pudding Teaspoon -- Pudding Cup -- Honey Teaspoon -- Honey Cup -- Nectar Teaspoon -- Nectar Cup -- Nectar Straw -- Thin Teaspoon -- Thin Cup -- Thin Straw -- Puree -- Mechanical Soft -- Regular -- Multi-consistency -- Pill -- Cervical Esophageal Comment -- Brandy [name] 02/14/2018, 2:54 PM                PHYSICAL EXAM  General - Well nourished, well developed, drowsy, easily arouses to voice or light touch, not in distress.   Cardiovascular - Regular rate and rhythm. Lungs: diminshed breath sounds in left lobe. Right lobe CTA.  Neuro - awake, alert, with eyes open, right gaze preference but able to cross midline.Paucity of speech, no aphasia, able to name 2/2 and repeat but moderate dysarthria. PERRL, full field full but with left simultagnosia. Left facial droop and tongue protrusion midline. Left UE 0/5 and left LE trace withdraw with pain. RUE  4/5 and RLE 3/5. Left babinski positive. Sensation symmetrical, coordination not cooperative. Gait not tested.    ASSESSMENT/PLAN Brandy [name] is a 77 y.o. female with history of tobacco use, previous TIA, hypertension, cardiomyopathy with an EF of 25%, and congestive heart failure, admitted to [facility] with left-sided weakness and right gaze preference. No tPA given due to late presentation. Mechanical thrombectomy attempted but failed- minimal recanalizatiobn of occluded RT MCA M1 seg.   Stroke: R MCA infarct with right M1 occlusion failed IR, likely due to cardioembolic source  Resultant right gaze and the left hemiplegia  MRI - large right MCA infarcts including BG, CR and punctate cortical infarcts  MRA  - right M1 occlusion with some flow in right M2 branches  Head CT - right MCA infarcts with right M1 hyperdense signal  CTA H&N - Acute RIGHT M1 MCA occlusion, but previous left M2 occlusion resolved  CTP -  Significant mismatch volume of 93 mL, encompassing much of the RIGHT MCA territory.   IR attempted but failed to recanalize right M1  2D Echo - EF 40%  TEE - EF 40%, cannot rule out early forming thrombus in the LAA versus artifact from posterior wall of the appendage, multiple runs of nonsustained A. tach and PACs were noted during the procedure  30-day cardiac event monitoring at discharge  LDL - 52  HgbA1c - 4.7  UDS - negative  Lovenox for VTE prophylaxis  aspirin 81 mg daily prior to admission, now on aspirin 325 mg daily and clopidogrel 75 mg daily.   Therapy recommendations: CIR; earliest today; pending  insurance  Disposition: CIR  History of stroke  06/2017 admitted in [place], left MCA infarct with left M2 occlusion and right M1 distal high-grade stenosis, EF 40%.  Put on aspirin 162 and a statin on discharge  This admission MRA showed right M1 occlusion but left M2 is patent now - indicating the embolic pattern  Cardiomyopathy  EF 35 to 40% in 05/2017  EF 40% in 06/2017  TTE and TEE EF 40% this admission  Has been followed with cardiology and on bidil and coreg PTA  Coreg  resumed  Not resumed bidil yet due to BP fluctuation.  Hypertension . Stable  . Resumed coreg, but not bidil yet.   Long term BP goal 130-150 given large vessel occlusion  Hyperlipidemia  Home meds:  Lipitor 10 mg daily  LDL 52, goal < 70  Resume Lipitor  Continue statin at discharge  Other Stroke Risk Factors  Advanced age  Former cigarette smoker - quit  CAD  Dysphagia  Secondary to stroke  dysphagia 1 diet  Low grade fever  Tmax 100.5->100.7  UA neg  CXR stable left basilar opacity  Continue to monitor  Other Active Problems  Post procedure anemia hemoglobin 8.9  Elevated Creatinine -1.14-  COPD  Thrombocytopenia: 144-125  Hospital day # [place], MSN, NP-C [facility] [phone]    To contact Stroke Continuity  provider, please refer to [email or web address] After hours, contact General Neurology

## 2018-02-16 NOTE — Progress Notes (Signed)
PT Cancellation Note  Patient Details Name: Brandy [name] MRN: [id number] DOB: 01/09/1941   Cancelled Treatment:    Reason Eval/Treat Not Completed: Other (comment). Pt to discharge to CIR this afternoon. Will defer treatment.   Mabeline Caras, PT, DPT Acute Rehabilitation Services  Pager [phone] Office [place] 02/16/2018, 4:16 PM

## 2018-02-16 NOTE — H&P (Signed)
Physical Medicine and Rehabilitation Admission H&P        Chief Complaint  Patient presents with  . Code Stroke  : HPI: Brandy [name] is a 77 year old right-handed female with history of cardiomyopathy, chronic combined systolic and diastolic congestive heart failure, hypertension,COPD/ tobacco abuse, CVA 06/2017 to [facility] maintained on aspirin 162 mg daily. Per chart review patient lives with spouse. 2 level home with one step to entry. Reportedly independent prior to admission she still drives and prepares meals. She does have a housekeeper. Presented 02/10/2018 [facility] hospital with left-sided weakness and right gaze preference. Cranial CT scan showed mixed pattern of both acute suspected subacute right hemisphere ischemia in the setting of hyperdense right M1 MCA and acute left side weakness consistent with emergent large vessel occlusion on the right proximal MCA. CT angiogram head and neck acute right M1 MCA occlusion. Patient was transferred to [facility] underwent mechanical thrombectomy minimal recannulization of occluded right MCA M1 segment per interventional radiology.Follow-upMRI the brain showed large area infarction right MCA territory as well as petechial hemorrhage within the right basal ganglia. Echocardiogram with ejection fraction of 40% diffuse hypokinesis. TEE completed 02/13/2018 with ejection fraction of 40% could not rule out early forming thrombus in the L AA versus artifact from posterior wall of the appendage. Multiple runs of nonsustained A. Tach and PACs were noted during procedure. Recommendations were for 30 day cardiac event monitor on discharge. Patient is maintained on aspirin and Plavix for CVA prophylaxis. Subcutaneous Lovenox for DVT prophylaxis. Dysphagia #1 thin liquid diet.Acute on chronic anemia 8.5 as well as platelets 120,000 and closely monitored. Therapy evaluations completed with recommendations of physical medicine rehabilitation  consult. Patient was admitted for a comprehensive rehabilitation program.   Review of Systems  Constitutional: Negative for chills and fever.  HENT: Negative for hearing loss.   Eyes: Negative for blurred vision and double vision.  Respiratory: Negative for shortness of breath.   Cardiovascular: Negative for chest pain, palpitations and leg swelling.  Gastrointestinal: Positive for constipation. Negative for nausea and vomiting.  Genitourinary: Negative for dysuria, flank pain and hematuria.  Musculoskeletal: Positive for myalgias.  Skin: Negative for rash.  Neurological: Positive for speech change and weakness.  All other systems reviewed and are negative.       Past Medical History:  Diagnosis Date  . Cardiomyopathy      a. EF of 25% in 07/2006 b. EF normalized by repeat echo in 2011 c. EF 35-40% by echo in 05/2017 with cath showing mild nonobstructive CAD  . Chronic combined systolic and diastolic CHF (congestive heart failure) ([place])    . Colon polyps    . Diverticulosis of colon    . Hypertension    . Hypertensive heart disease    . Osteopenia    . TIA (transient ischemic attack)    . Tobacco abuse      50 pack year         Past Surgical History:  Procedure Laterality Date  . BREAST BIOPSY      . CARPAL TUNNEL RELEASE        right hand  . CERVICAL FUSION      . CESAREAN SECTION        2 times  . COLONOSCOPY   1025    pt uncertain as to whether polypectomy required or performed  . ECTOPIC PREGNANCY SURGERY      . IR CT HEAD LTD   02/10/2018  . IR PERCUTANEOUS  ART THROMBECTOMY/INFUSION INTRACRANIAL INC DIAG ANGIO   02/10/2018  . RADIOLOGY WITH ANESTHESIA N/A 02/10/2018    Procedure: RADIOLOGY WITH ANESTHESIA;  Surgeon: Luanne Bras, MD;  Location: [facility];  Service: Radiology;  Laterality: N/A;  . RIGHT/LEFT HEART CATH AND CORONARY ANGIOGRAPHY N/A 06/03/2017    Procedure: RIGHT/LEFT HEART CATH AND CORONARY ANGIOGRAPHY;  Surgeon: Jettie Booze, MD;  Location:  [place] [facility];  Service: Cardiovascular;  Laterality: N/A;  . TONSILLECTOMY             Family History  Problem Relation Age of Onset  . Hypertension Brother    . Hypertension Maternal Grandmother    . Cancer Paternal Grandmother      Social History:  reports that she quit smoking about 9 years ago. Her smoking use included cigarettes. She started smoking about 57 years ago. She has a 50.00 pack-year smoking history. She has never used smokeless tobacco. She reports that she does not drink alcohol or use drugs. Allergies:      Allergies  Allergen Reactions  . Lisinopril Swelling    Medications Prior to Admission  Medication Sig Dispense Refill  . aspirin EC 81 MG EC tablet Take 2 tablets (162 mg total) by mouth daily. 30 tablet 1  . atorvastatin (LIPITOR) 10 MG tablet TAKE 1 TABLET BY MOUTH EVERY DAY AT 6 PM 30 tablet 6  . BIDIL 20-37.5 MG tablet TAKE 1 TABLET BY MOUTH THREE TIMES DAILY 90 tablet 6  . carvedilol (COREG) 12.5 MG tablet Take 1 tablet (12.5 mg total) by mouth 2 (two) times daily with a meal. 60 tablet 6  . cetirizine (ZYRTEC) 10 MG tablet Take 10 mg by mouth daily as needed for allergies.       Marland Kitchen donepezil (ARICEPT) 5 MG tablet Take 1 tablet by mouth at bedtime.   1  . furosemide (LASIX) 20 MG tablet Take 1 tablet (20 mg total) by mouth daily as needed for edema. 90 tablet 3  . predniSONE (DELTASONE) 5 MG tablet Take 5 mg by mouth daily as needed (for swelling and right knee pain).      Marland Kitchen umeclidinium-vilanterol (ANORO ELLIPTA) 62.5-25 MCG/INH AEPB Inhale 1 puff into the lungs daily. 60 each 5      Drug Regimen Review Drug regimen was reviewed and remains appropriate with no significant issues identified   Home: Home Living Family/patient expects to be discharged to:: Private residence Living Arrangements: Spouse/significant other, Children Available Help at Discharge: Family, Available 24 hours/day Type of Home: House Home Access: Stairs to enter [employer] of Steps: 1 Entrance Stairs-Rails: None Home Layout: Two level Alternate Level Stairs-Number of Steps: 12 steps to basement Alternate Level Stairs-Rails: Right, Left, Can reach both Bathroom Shower/Tub: Multimedia programmer: Standard Bathroom Accessibility: Yes Home Equipment: Industrial/product designer History: Prior Function Level of Independence: Independent Comments: community ambulator, drives, she cooks and has a Psychologist, educational Status:  Mobility: Bed Mobility Overal bed mobility: Needs Assistance Bed Mobility: Rolling, Sidelying to Sit Rolling: Min assist Sidelying to sit: Mod assist Supine to sit: Max assist Sit to supine: Mod assist Sit to sidelying: Max assist General bed mobility comments: pt able to bend RLE and reach for rail to roll to left very limited min assist to roll to side. Mod assist to bring legs off of bed and elevate trunk from surface. Pt able to hook legs to bring legs off today Transfers Overall transfer level: Needs assistance  Transfers: Sit to/from Stand, [employer] Transfers Sit to Stand: Mod assist, +2 physical assistance Stand pivot transfers: Max assist, +2 physical assistance General transfer comment: left knee blocked with support of LUE and assist to rise from bed . PT with flexed trunk in standing and cues to extend bil LE with max reliance on blocking of LLE to prevent buckling. Max +2 assist to pivot to right with pt scooting foot with assist to pivot and with pt actually unweighting RLE significant buckle with support for standing. Grossly 2 min in standing Ambulation/Gait General Gait Details: unable   ADL: ADL Overall ADL's : Needs assistance/impaired Eating/Feeding: Minimal assistance, Sitting Grooming: Wash/dry hands, Wash/dry face, Oral care, Minimal assistance, Bed level, Sitting Upper Body Bathing: Maximal assistance, Bed level Lower Body Bathing: Maximal assistance, Bed level Upper Body  Dressing : Total assistance, Sitting, Bed level Lower Body Dressing: Total assistance, Bed level Toilet Transfer: Maximal assistance Toileting- Clothing Manipulation and Hygiene: Total assistance, Bed level Functional mobility during ADLs: Maximal assistance   Cognition: Cognition Overall Cognitive Status: Impaired/Different from baseline Orientation Level: Oriented X4 Cognition Arousal/Alertness: Awake/alert Behavior During Therapy: Flat affect Overall Cognitive Status: Impaired/Different from baseline Area of Impairment: Attention, Awareness, Problem solving Current Attention Level: Selective Safety/Judgement: Decreased awareness of deficits Awareness: Intellectual Problem Solving: Decreased initiation, Difficulty sequencing, Requires verbal cues General Comments: left inattention   Physical Exam: Blood pressure (!) 142/68, pulse 67, temperature 98.2 F (36.8 C), temperature source Oral, resp. rate 20, height 5' 4"  (1.626 m), weight 68.5 kg, SpO2 100 %. Physical Exam  Vitals reviewed. HENT:  Head: Normocephalic.  Eyes: EOM are normal.  Neck: Normal range of motion. Neck supple. No thyromegaly present.  Cardiovascular: Normal rate and regular rhythm.  Respiratory: Effort normal and breath sounds normal. No respiratory distress.  GI: Soft. Bowel sounds are normal. She exhibits no distension.  Neurological: She is alert.  Patient is a bit lethargic but arousable.Dysarthric speech.  Follows commands .Makes good eye contact with examiner  Skin: Skin is warm and dry.   Patient has severe left neglect.  She is able to track past midline toward the left however. She has no sensation to pinch in the left upper and left lower limb Motor strength is 0/5 in left upper limb Motor strength 0/5 in left lower limb Right upper extremity is 4+ at the deltoid bicep tricep grip Right lower extremity is 4+ at the hip flexor knee extensor ankle dorsi flexor    Lab Results Last 48 Hours    Results for orders placed or performed during the hospital encounter of 02/10/18 (from the past 48 hour(s))  CBC     Status: Abnormal    Collection Time: 02/14/18  6:12 AM  Result Value Ref Range    WBC 9.6 4.0 - 10.5 K/uL    RBC 3.23 (L) 3.87 - 5.11 MIL/uL    Hemoglobin 8.9 (L) 12.0 - 15.0 g/dL      Comment: REPEATED TO VERIFY    HCT 29.3 (L) 36.0 - 46.0 %    MCV 90.7 80.0 - 100.0 fL    MCH 27.6 26.0 - 34.0 pg    MCHC 30.4 30.0 - 36.0 g/dL    RDW 14.5 11.5 - 15.5 %    Platelets 125 (L) 150 - 400 K/uL      Comment: REPEATED TO VERIFY    nRBC 0.0 0.0 - 0.2 %      Comment: Performed at [facility], [place] [address],  [place], [place] [address]  Basic metabolic panel     Status: Abnormal    Collection Time: 02/14/18  6:12 AM  Result Value Ref Range    Sodium 141 135 - 145 mmol/L    Potassium 3.4 (L) 3.5 - 5.1 mmol/L    Chloride 110 98 - 111 mmol/L    CO2 23 22 - 32 mmol/L    Glucose, Bld 83 70 - 99 mg/dL    BUN 12 8 - 23 mg/dL    Creatinine, Ser 1.14 (H) 0.44 - 1.00 mg/dL    Calcium 8.4 (L) 8.9 - 10.3 mg/dL    GFR calc non Af Amer 46 (L) >60 mL/min    GFR calc Af Amer 53 (L) >60 mL/min      Comment: (NOTE) The eGFR has been calculated using the CKD EPI equation. This calculation has not been validated in all clinical situations. eGFR's persistently <60 mL/min signify possible Chronic Kidney Disease.      Anion gap 8 5 - 15      Comment: Performed at [facility] [address] [place], [place] [address]  Urinalysis, Complete w Microscopic     Status: Abnormal    Collection Time: 02/14/18 11:06 AM  Result Value Ref Range    Color, Urine YELLOW YELLOW    APPearance CLEAR CLEAR    Specific Gravity, Urine 1.019 1.005 - 1.030    pH 5.0 5.0 - 8.0    Glucose, UA NEGATIVE NEGATIVE mg/dL    Hgb urine dipstick NEGATIVE NEGATIVE    Bilirubin Urine NEGATIVE NEGATIVE    Ketones, ur 80 (A) NEGATIVE mg/dL    Protein, ur NEGATIVE NEGATIVE mg/dL    Nitrite NEGATIVE  NEGATIVE    Leukocytes, UA NEGATIVE NEGATIVE    RBC / HPF 0-5 0 - 5 RBC/hpf    WBC, UA 0-5 0 - 5 WBC/hpf    Bacteria, UA NONE SEEN NONE SEEN    Mucus PRESENT        Comment: Performed at [facility] [address] [place], [place] [address]  CBC     Status: Abnormal    Collection Time: 02/15/18  4:24 AM  Result Value Ref Range    WBC 9.0 4.0 - 10.5 K/uL    RBC 3.06 (L) 3.87 - 5.11 MIL/uL    Hemoglobin 8.5 (L) 12.0 - 15.0 g/dL    HCT 27.6 (L) 36.0 - 46.0 %    MCV 90.2 80.0 - 100.0 fL    MCH 27.8 26.0 - 34.0 pg    MCHC 30.8 30.0 - 36.0 g/dL    RDW 14.5 11.5 - 15.5 %    Platelets 120 (L) 150 - 400 K/uL    nRBC 0.0 0.0 - 0.2 %      Comment: Performed at [facility], [place] [address] [place],  [address]  Basic metabolic panel     Status: Abnormal    Collection Time: 02/15/18  4:24 AM  Result Value Ref Range    Sodium 141 135 - 145 mmol/L    Potassium 3.2 (L) 3.5 - 5.1 mmol/L    Chloride 111 98 - 111 mmol/L    CO2 21 (L) 22 - 32 mmol/L    Glucose, Bld 82 70 - 99 mg/dL    BUN 11 8 - 23 mg/dL    Creatinine, Ser 1.04 (H) 0.44 - 1.00 mg/dL    Calcium 8.4 (L) 8.9 - 10.3 mg/dL    GFR calc  non Af Amer 51 (L) >60 mL/min    GFR calc Af Amer 59 (L) >60 mL/min      Comment: (NOTE) The eGFR has been calculated using the CKD EPI equation. This calculation has not been validated in all clinical situations. eGFR's persistently <60 mL/min signify possible Chronic Kidney Disease.      Anion gap 9 5 - 15      Comment: Performed at [facility] [address] [place], [place] [address]       Imaging Results (Last 48 hours)  Dg Chest Port 1 View   Result Date: 02/14/2018 CLINICAL DATA:  Fever. EXAM: PORTABLE CHEST 1 VIEW COMPARISON:  Radiographs of September 13, 2017. FINDINGS: Stable cardiomegaly. No pneumothorax is noted. Right lung is clear. Possible mild left basilar opacity is noted which may represent atelectasis or infiltrate with associated pleural effusion.  Lateral radiograph may be performed further evaluation. Bony thorax is unremarkable. IMPRESSION: Possible mild left basilar atelectasis or infiltrate is noted with associated pleural effusion. Lateral projection is recommended for further evaluation. Electronically Signed   By: Marijo Conception, M.D.   On: 02/14/2018 07:40    Dg Swallowing Func-speech Pathology   Result Date: 02/14/2018 Objective Swallowing Evaluation: Type of Study: MBS-Modified Barium Swallow Study  Patient Details Name: TAKYRA CANTRALL MRN: [id number] Date of Birth: Aug 28, 1940 Today's Date: 02/14/2018 Time: SLP Start Time (ACUTE ONLY): 1406 -SLP Stop Time (ACUTE ONLY): 1420 SLP Time Calculation (min) (ACUTE ONLY): 14 min Past Medical History: Past Medical History: Diagnosis Date . Cardiomyopathy   a. EF of 25% in 07/2006 b. EF normalized by repeat echo in 2011 c. EF 35-40% by echo in 05/2017 with cath showing mild nonobstructive CAD . Chronic combined systolic and diastolic CHF (congestive heart failure) ([place])  . Colon polyps  . Diverticulosis of colon  . Hypertension  . Hypertensive heart disease  . Osteopenia  . TIA (transient ischemic attack)  . Tobacco abuse   50 pack year Past Surgical History: Past Surgical History: Procedure Laterality Date . BREAST BIOPSY   . CARPAL TUNNEL RELEASE    right hand . CERVICAL FUSION   . CESAREAN SECTION    2 times . COLONOSCOPY  3295  pt uncertain as to whether polypectomy required or performed . ECTOPIC PREGNANCY SURGERY   . IR CT HEAD LTD  02/10/2018 . IR PERCUTANEOUS ART THROMBECTOMY/INFUSION INTRACRANIAL INC DIAG ANGIO  02/10/2018 . RADIOLOGY WITH ANESTHESIA N/A 02/10/2018  Procedure: RADIOLOGY WITH ANESTHESIA;  Surgeon: Luanne Bras, MD;  Location: [facility];  Service: Radiology;  Laterality: N/A; . RIGHT/LEFT HEART CATH AND CORONARY ANGIOGRAPHY N/A 06/03/2017  Procedure: RIGHT/LEFT HEART CATH AND CORONARY ANGIOGRAPHY;  Surgeon: Jettie Booze, MD;  Location: [facility];  Service:  Cardiovascular;  Laterality: N/A; . TONSILLECTOMY   HPI: 77 yo admitted with Left weakness and right gaze with Rt MCA CVA. Mechanical thrombectomy attempted but failed- minimal recanalizatiobn of occluded RT MCA M1 seg. PMH significant for cardiomyopathy, TIA, HTN, CHF, Lt MCA stroke ( April 2019), and tobacco abuse  Subjective: pt awake, alert, pleasant, participative Assessment / Plan / Recommendation CHL IP CLINICAL IMPRESSIONS 02/14/2018 Clinical Impression Pt presents with moderate oral and mild pharyngeal dysphagia c/b decreased labial seal, reduced lingual strength and coordination, premature spillage, and delayed swallow initiation.  These deficits resulted in anterior spillage, decrease bolus formation and A-P transit, piecemeal deglutition, oral residue, and trace transient penetration of thin liquid by straw.  Cup sips prevented penetration of  thin liquid but resulted in mild anterior loss of thin liquid.  Pt required prolonged oral phase with lingual pumping noted and significant oral residue. There was spillover of oral residuals after the swallow, which did not penetrate into laryngeal vestibule and was cleared with subsequent swallow. During pill simulation, pt was unable to transit tablet and it was removed from oral cavity with swab.  Recommend puree diet with thin liquid by cup. SLP Visit Diagnosis Dysphagia, oropharyngeal phase (R13.12) Attention and concentration deficit following -- Frontal lobe and executive function deficit following -- Impact on safety and function Mild aspiration risk   CHL IP TREATMENT RECOMMENDATION 02/14/2018 Treatment Recommendations Therapy as outlined in treatment plan below   Prognosis 02/14/2018 Prognosis for Safe Diet Advancement Fair Barriers to Reach Goals -- Barriers/Prognosis Comment -- CHL IP DIET RECOMMENDATION 02/14/2018 SLP Diet Recommendations Dysphagia 1 (Puree) solids;Thin liquid Liquid Administration via No straw;Cup Medication Administration Crushed  with puree Compensations Slow rate;Small sips/bites Postural Changes --   CHL IP OTHER RECOMMENDATIONS 02/14/2018 Recommended Consults -- Oral Care Recommendations Oral care BID Other Recommendations --   CHL IP FOLLOW UP RECOMMENDATIONS 02/14/2018 Follow up Recommendations (No Data)   CHL IP FREQUENCY AND DURATION 02/14/2018 Speech Therapy Frequency (ACUTE ONLY) min 2x/week Treatment Duration 2 weeks      CHL IP ORAL PHASE 02/14/2018 Oral Phase Impaired Oral - Pudding Teaspoon -- Oral - Pudding Cup -- Oral - Honey Teaspoon -- Oral - Honey Cup -- Oral - Nectar Teaspoon -- Oral - Nectar Cup -- Oral - Nectar Straw -- Oral - Thin Teaspoon -- Oral - Thin Cup Premature spillage;Right anterior bolus loss;Left anterior bolus loss Oral - Thin Straw Premature spillage Oral - Puree Lingual pumping;Piecemeal swallowing;Premature spillage;Delayed oral transit Oral - Mech Soft Lingual pumping;Piecemeal swallowing;Premature spillage;Delayed oral transit Oral - Regular Piecemeal swallowing;Premature spillage;Delayed oral transit;Lingual pumping Oral - Multi-Consistency -- Oral - Pill Delayed oral transit;Lingual pumping;Reduced posterior propulsion;Holding of bolus Oral Phase - Comment --  CHL IP PHARYNGEAL PHASE 02/14/2018 Pharyngeal Phase Impaired Pharyngeal- Pudding Teaspoon -- Pharyngeal -- Pharyngeal- Pudding Cup -- Pharyngeal -- Pharyngeal- Honey Teaspoon -- Pharyngeal -- Pharyngeal- Honey Cup -- Pharyngeal -- Pharyngeal- Nectar Teaspoon -- Pharyngeal -- Pharyngeal- Nectar Cup -- Pharyngeal -- Pharyngeal- Nectar Straw -- Pharyngeal -- Pharyngeal- Thin Teaspoon -- Pharyngeal -- Pharyngeal- Thin Cup Delayed swallow initiation-pyriform sinuses;Pharyngeal residue - valleculae;Pharyngeal residue - pyriform Pharyngeal Material does not enter airway Pharyngeal- Thin Straw Delayed swallow initiation-pyriform sinuses;Penetration/Aspiration before swallow;Pharyngeal residue - valleculae;Pharyngeal residue - pyriform Pharyngeal  Material enters airway, remains ABOVE vocal cords then ejected out Pharyngeal- Puree Delayed swallow initiation-vallecula Pharyngeal Material does not enter airway Pharyngeal- Mechanical Soft Delayed swallow initiation-vallecula Pharyngeal Material does not enter airway Pharyngeal- Regular Delayed swallow initiation-vallecula Pharyngeal Material does not enter airway Pharyngeal- Multi-consistency -- Pharyngeal -- Pharyngeal- Pill NT Pharyngeal -- Pharyngeal Comment --  CHL IP CERVICAL ESOPHAGEAL PHASE 02/14/2018 Cervical Esophageal Phase WFL Pudding Teaspoon -- Pudding Cup -- Honey Teaspoon -- Honey Cup -- Nectar Teaspoon -- Nectar Cup -- Nectar Straw -- Thin Teaspoon -- Thin Cup -- Thin Straw -- Puree -- Mechanical Soft -- Regular -- Multi-consistency -- Pill -- Cervical Esophageal Comment -- Leigh E Borum 02/14/2018, 2:54 PM                        Medical Problem List and Plan: 1.  Left side weakness secondary to right MCA infarction with right M1 occlusion status post minimal recannulization after mechanical thrombectomy as  well as history of left MCA infarction April 2019. Advise 30 day cardiac monitor on discharge 2.  DVT Prophylaxis/Anticoagulation: Subcutaneous Lovenox. 3. Pain Management:  Tylenol as needed 4. Mood:  Aricept 5 mg daily 5. Neuropsych: This patient he is capable of making decisions on her own behalf. 6. Skin/Wound Care:  Routine skin checks 7. Fluids/Electrolytes/Nutrition: Routine ins and outs with follow-up chemistries 8. Cardiomyopathy/chronic combined diastolic congestive heart failure. Monitor for any signs of fluid overload. 9. Hypertension. Coreg 12.5 mg twice a day. Monitor with increased mobility 10. COPD/tobacco abuse. Continue inhalers. Provide counseling 11. Hyperlipidemia. Lipitor 12. Acute on chronic anemia. Admission hgb 8.8   Post Admission Physician Evaluation: 1. Functional deficits secondary  to Right MCA infarct with Left hemiparesis, Left neglect, Left  hemisensory deficits and cognitive deficits. 2. Patient admitted to receive collaborative, interdisciplinary care between the physiatrist, rehab nursing staff, and therapy team. 3. Patient's level of medical complexity and substantial therapy needs in context of that medical necessity cannot be provided at a lesser intensity of care. 4. Patient has experienced substantial functional loss from his/her baseline. Judging by the patient's diagnosis, physical exam, and functional history, the patient has potential for functional progress which will result in measurable gains while on inpatient rehab.  These gains will be of substantial and practical use upon discharge in facilitating mobility and self-care at the household level. 5. Physiatrist will provide 24 hour management of medical needs as well as oversight of the therapy plan/treatment and provide guidance as appropriate regarding the interaction of the two. 6. 24 hour rehab nursing will assist in the management of  bladder management, bowel management, safety, skin/wound care, disease management, medication administration, pain management and patient education  and help integrate therapy concepts, techniques,education, etc. 7. PT will assess and treat for:pre gait, gait training, endurance , safety, equipment, neuromuscular re education  .  Goals are: moderate assist. 8. OT will assess and treat for ADLs, Cognitive perceptual skills, Neuromuscular re education, safety, endurance, equipment  .  Goals are: moderate assist.  9. SLP will assess and treat for left neglect, attention, concentration, thought organization, problem-solving, medication management  .  Goals are: minimal assist. 10. Case Management and Social Worker will assess and treat for psychological issues and discharge planning. 11. Team conference will be held weekly to assess progress toward goals and to determine barriers to discharge. 12.  Patient will receive at least 3 hours of  therapy per day at least 5 days per week. 13. ELOS and Prognosis: 21 to 23 days fair   "I have personally performed a face to face diagnostic evaluation of this patient.  Additionally, I have reviewed and concur with the physician assistant's documentation above."  Charlett Blake M.D. [facility] FAAPM&R (Sports Med, Neuromuscular Med) Diplomate Am Board of Electrodiagnostic Med  Cathlyn Parsons, PA-C 02/15/2018

## 2018-02-16 NOTE — Progress Notes (Addendum)
Admission note: Brandy [name], no noted distress. 2L N/c, denies using oxygen at home. No noted skin issues. Under rt abd fold noted, small area size of 0.2cm open, no noted drainage. RLE (knee edema). Brandy has been able to make needs known. Educated family and Brandy about the rehab unit and expectation. Daughter will bring active wear, so patient can participate in rehab. Family understands no one can assess the patient with feeding until speech assess her. Cont B/B LBM 02/16/18, staff will assess her with toilet needs. Brandy has iphone 11 cell phone at bedside with charger, encased in red glitter case. Spouse at bedside.

## 2018-02-16 NOTE — Care Management Important Message (Signed)
Important Message  Patient Details  Name: Brandy [name] MRN: [id number] Date of Birth: [birth year]   Medicare Important Message Given:  Yes    Selenia Mihok [address] 02/16/2018, 3:17 PM

## 2018-02-16 NOTE — Progress Notes (Signed)
Physical Medicine and Rehabilitation Consult Reason for Consult: Stroke Referring Physician: Rosalin Hawking, MD   HPI: Brandy [name] is a 77 y.o. female with pmh of CVA, HTN, CHF, CAD, cervical fusion presented with right MCA infarct.  History taken from chart review and brother.  Husband noted left sided weakness and drooling.  Patient present to OSH with left sided weakness and right gaze.  She was transferred to [facility] and underwent unsuccessful thrombectomy. MRI reviewed, showing right MCA infarct.  Per report, MRI/MRA right basal ganglia infarct, right M1 occlusion with punctate cortical infarcts.  TEE and loop once stable.   ROS  Negative per pt, but unreliable      Past Medical History:  Diagnosis Date  . Cardiomyopathy    a. EF of 25% in 07/2006 b. EF normalized by repeat echo in 2011 c. EF 35-40% by echo in 05/2017 with cath showing mild nonobstructive CAD  . Chronic combined systolic and diastolic CHF (congestive heart failure) ([place])   . Colon polyps   . Diverticulosis of colon   . Hypertension   . Hypertensive heart disease   . Osteopenia   . TIA (transient ischemic attack)   . Tobacco abuse    50 pack year        Past Surgical History:  Procedure Laterality Date  . BREAST BIOPSY    . CARPAL TUNNEL RELEASE     right hand  . CERVICAL FUSION    . CESAREAN SECTION     2 times  . COLONOSCOPY  4166   pt uncertain as to whether polypectomy required or performed  . ECTOPIC PREGNANCY SURGERY    . RADIOLOGY WITH ANESTHESIA N/A 02/10/2018   Procedure: RADIOLOGY WITH ANESTHESIA;  Surgeon: Luanne Bras, MD;  Location: [place];  Service: Radiology;  Laterality: N/A;  . RIGHT/LEFT HEART CATH AND CORONARY ANGIOGRAPHY N/A 06/03/2017   Procedure: RIGHT/LEFT HEART CATH AND CORONARY ANGIOGRAPHY;  Surgeon: Jettie Booze, MD;  Location: [place] [facility];  Service: Cardiovascular;  Laterality: N/A;  . TONSILLECTOMY          Family  History  Problem Relation Age of Onset  . Hypertension Brother   . Hypertension Maternal Grandmother   . Cancer Paternal Grandmother    Social History:  reports that she quit smoking about 9 years ago. Her smoking use included cigarettes. She started smoking about 57 years ago. She has a 50.00 pack-year smoking history. She has never used smokeless tobacco. She reports that she does not drink alcohol or use drugs. Allergies:      Allergies  Allergen Reactions  . Lisinopril Swelling         Medications Prior to Admission  Medication Sig Dispense Refill  . aspirin EC 81 MG EC tablet Take 2 tablets (162 mg total) by mouth daily. 30 tablet 1  . atorvastatin (LIPITOR) 10 MG tablet TAKE 1 TABLET BY MOUTH EVERY DAY AT 6 PM 30 tablet 6  . BIDIL 20-37.5 MG tablet TAKE 1 TABLET BY MOUTH THREE TIMES DAILY 90 tablet 6  . carvedilol (COREG) 12.5 MG tablet Take 1 tablet (12.5 mg total) by mouth 2 (two) times daily with a meal. 60 tablet 6  . cetirizine (ZYRTEC) 10 MG tablet Take 10 mg by mouth daily as needed for allergies.     Marland Kitchen donepezil (ARICEPT) 5 MG tablet Take 1 tablet by mouth at bedtime.  1  . furosemide (LASIX) 20 MG tablet Take 1 tablet (20 mg total) by mouth  daily as needed for edema. 90 tablet 3  . predniSONE (DELTASONE) 5 MG tablet Take 5 mg by mouth daily as needed (for swelling and right knee pain).    Marland Kitchen umeclidinium-vilanterol (ANORO ELLIPTA) 62.5-25 MCG/INH AEPB Inhale 1 puff into the lungs daily. 60 each 5    Home: Home Living Family/patient expects to be discharged to:: Private residence Living Arrangements: Spouse/significant other, Children Available Help at Discharge: Family, Available 24 hours/day Type of Home: House Home Access: Stairs to enter [employer] of Steps: 1 Entrance Stairs-Rails: None Home Layout: Two level Alternate Level Stairs-Number of Steps: 12 steps to basement Alternate Level Stairs-Rails: Right, Left, Can reach both Bathroom  Shower/Tub: Multimedia programmer: Standard Bathroom Accessibility: Yes Home Equipment: Careers adviser History: Prior Function Level of Independence: Independent Comments: community ambulator, drives, 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  Pt able to assist with lowering trunk down to bed and able to lift Rt LE onto bed.  Required assistance for Lt LE  Transfers Overall transfer level: Needs assistance Transfers: Sit to/from Stand Sit to Stand: Max assist General transfer comment: with left knee blocked and over the back assist pt able to bear weight on RLE and initiate elevating sacrum from surface but unable to achieve full standing  ADL: ADL Overall ADL's : Needs assistance/impaired Eating/Feeding: Minimal assistance, Sitting Grooming: Wash/dry hands, Wash/dry face, Oral care, Minimal assistance, Bed level, Sitting Upper Body Bathing: Maximal assistance, Bed level Lower Body Bathing: Maximal assistance, Bed level Upper Body Dressing : Total assistance, Sitting, Bed level Lower Body Dressing: Total assistance, Bed level Toilet Transfer: Maximal assistance Toileting- Clothing Manipulation and Hygiene: Total assistance, Bed level Functional mobility during ADLs: Maximal assistance  Cognition: Cognition Overall Cognitive Status: Impaired/Different from baseline Orientation Level: Oriented X4 Cognition Arousal/Alertness: Awake/alert Behavior During Therapy: Flat affect Overall Cognitive Status: Impaired/Different from baseline Area of Impairment: Attention, Awareness, Problem solving Current  Attention Level: Selective Safety/Judgement: Decreased awareness of deficits Awareness: Intellectual Problem Solving: Decreased initiation, Difficulty sequencing, Requires verbal cues General Comments: left inattention  Blood pressure 127/70, pulse 65, temperature 98.5 F (36.9 C), temperature source Oral, resp. rate 15, height 5\' 4"  (1.626 m), weight 68.5 kg, SpO2 99 %. Physical Exam  Constitutional: She appears well-developed.  Obese  HENT:  Head: Normocephalic and atraumatic.  Eyes: EOM are normal.  Right eye ptosis  Neck: Normal range of motion. Neck supple.  Cardiovascular: Normal rate and regular rhythm.  Respiratory: Effort normal and breath sounds normal.  GI: Soft. Bowel sounds are normal.  Musculoskeletal:  No edema or tenderness in extremities  Neurological: She is alert.  Motor: RUE: 5/5 proximal to distal RLE: 4/5 proximal to distal LUE: 0/5 proximal to distall LLE: HF 1/5, distally 0/5 Sensation intact to light touch  Skin: Skin is warm and dry.  Psychiatric: Her speech is slurred. She is slowed. She exhibits abnormal remote memory.    LabResultsLast24Hours  Results for orders placed or performed during the hospital encounter of 02/10/18 (from the past 24 hour(s))  CBC     Status: Abnormal   Collection Time: 02/12/18  3:38 PM  Result  Value Ref Range   WBC 7.8 4.0 - 10.5 K/uL   RBC 3.07 (L) 3.87 - 5.11 MIL/uL   Hemoglobin 8.8 (L) 12.0 - 15.0 g/dL   HCT 27.8 (L) 36.0 - 46.0 %   MCV 90.6 80.0 - 100.0 fL   MCH 28.7 26.0 - 34.0 pg   MCHC 31.7 30.0 - 36.0 g/dL   RDW 15.3 11.5 - 15.5 %   Platelets 144 (L) 150 - 400 K/uL   nRBC 0.0 0.0 - 0.2 %  Basic metabolic panel     Status: Abnormal   Collection Time: 02/12/18  3:38 PM  Result Value Ref Range   Sodium 141 135 - 145 mmol/L   Potassium 3.5 3.5 - 5.1 mmol/L   Chloride 112 (H) 98 - 111 mmol/L   CO2 24 22 - 32 mmol/L   Glucose, Bld 82 70 - 99 mg/dL   BUN 15 8 - 23 mg/dL    Creatinine, Ser 1.05 (H) 0.44 - 1.00 mg/dL   Calcium 8.5 (L) 8.9 - 10.3 mg/dL   GFR calc non Af Amer 51 (L) >60 mL/min   GFR calc Af Amer 59 (L) >60 mL/min   Anion gap 5 5 - 15      ImagingResults(Last48hours)  Mr Brain Wo Contrast  Result Date: 02/12/2018 CLINICAL DATA:  Stroke follow-up.  Right M1 occlusion. EXAM: MRI HEAD WITHOUT CONTRAST MRA HEAD WITHOUT CONTRAST TECHNIQUE: Multiplanar, multiecho pulse sequences of the brain and surrounding structures were obtained without intravenous contrast. Angiographic images of the head were obtained using MRA technique without contrast. COMPARISON:  Head CT/CTA 02/10/2018 FINDINGS: MRI HEAD FINDINGS BRAIN: There is multifocal diffusion restriction throughout the right MCA territory, predominantly within the right basal ganglia and insula. There is mild mass effect on the frontal horn of the right lateral ventricle. The midline structures are normal. There is cytotoxic edema within the right MCA distribution according to the areas ischemia. There is petechial hemorrhage within the right basal ganglia. The CSF spaces are normal for age, with no hydrocephalus. Susceptibility-sensitive sequences show no chronic microhemorrhage or superficial siderosis. SKULL AND UPPER CERVICAL SPINE: The visualized skull base, calvarium, upper cervical spine and extracranial soft tissues are normal. SINUSES/ORBITS: No fluid levels or advanced mucosal thickening. No mastoid or middle ear effusion. The orbits are normal. MRA HEAD FINDINGS Intracranial internal carotid arteries: Normal. Anterior cerebral arteries: Normal. Middle cerebral arteries: The M1 segment of the right middle cerebral artery remains occluded. There is return of flow related enhancement within the proximal M2 branches. Left MCA is normal. Posterior communicating arteries: Not visualized Posterior cerebral arteries: Normal. Basilar artery: Normal. Vertebral arteries: Codominant.  Normal. Superior  cerebellar arteries: Normal. Inferior cerebellar arteries: Normal. IMPRESSION: 1. Persistent occlusion of the right middle cerebral artery M1 segment. There is flow related enhancement within the right M2 branches. 2. Large area infarction within the right middle cerebral artery territory, predominantly involving the anterior insula and the basal ganglia. 3. Petechial hemorrhage within the right basal ganglia. Heidelberg classification 1b: HI2, confluent petechiae, no mass effect. Electronically Signed   By: Ulyses Jarred M.D.   On: 02/12/2018 00:25   Mr Jodene Nam Head Wo Contrast  Result Date: 02/12/2018 CLINICAL DATA:  Stroke follow-up.  Right M1 occlusion. EXAM: MRI HEAD WITHOUT CONTRAST MRA HEAD WITHOUT CONTRAST TECHNIQUE: Multiplanar, multiecho pulse sequences of the brain and surrounding structures were obtained without intravenous contrast. Angiographic images of the head were obtained using MRA technique without contrast. COMPARISON:  Head CT/CTA 02/10/2018 FINDINGS:  MRI HEAD FINDINGS BRAIN: There is multifocal diffusion restriction throughout the right MCA territory, predominantly within the right basal ganglia and insula. There is mild mass effect on the frontal horn of the right lateral ventricle. The midline structures are normal. There is cytotoxic edema within the right MCA distribution according to the areas ischemia. There is petechial hemorrhage within the right basal ganglia. The CSF spaces are normal for age, with no hydrocephalus. Susceptibility-sensitive sequences show no chronic microhemorrhage or superficial siderosis. SKULL AND UPPER CERVICAL SPINE: The visualized skull base, calvarium, upper cervical spine and extracranial soft tissues are normal. SINUSES/ORBITS: No fluid levels or advanced mucosal thickening. No mastoid or middle ear effusion. The orbits are normal. MRA HEAD FINDINGS Intracranial internal carotid arteries: Normal. Anterior cerebral arteries: Normal. Middle cerebral  arteries: The M1 segment of the right middle cerebral artery remains occluded. There is return of flow related enhancement within the proximal M2 branches. Left MCA is normal. Posterior communicating arteries: Not visualized Posterior cerebral arteries: Normal. Basilar artery: Normal. Vertebral arteries: Codominant.  Normal. Superior cerebellar arteries: Normal. Inferior cerebellar arteries: Normal. IMPRESSION: 1. Persistent occlusion of the right middle cerebral artery M1 segment. There is flow related enhancement within the right M2 branches. 2. Large area infarction within the right middle cerebral artery territory, predominantly involving the anterior insula and the basal ganglia. 3. Petechial hemorrhage within the right basal ganglia. Heidelberg classification 1b: HI2, confluent petechiae, no mass effect. Electronically Signed   By: Ulyses Jarred M.D.   On: 02/12/2018 00:25   Vas [place] Lower Extremity Venous (dvt)  Result Date: 02/12/2018  Lower Venous Study Indications: Embolic stroke.  Limitations: Body habitus, bandages and immobility. Performing Technologist: Lorina Rabon  Examination Guidelines: A complete evaluation includes B-mode imaging, spectral Doppler, color Doppler, and power Doppler as needed of all accessible portions of each vessel. Bilateral testing is considered an integral part of a complete examination. Limited examinations for reoccurring indications may be performed as noted.  Right Venous Findings: +---------+---------------+---------+-----------+----------+-------+          CompressibilityPhasicitySpontaneityPropertiesSummary +---------+---------------+---------+-----------+----------+-------+ CFV      Full           Yes      Yes                          +---------+---------------+---------+-----------+----------+-------+ SFJ      Full                                                 +---------+---------------+---------+-----------+----------+-------+ FV Prox   Full                                                 +---------+---------------+---------+-----------+----------+-------+ FV Mid   Full                                                 +---------+---------------+---------+-----------+----------+-------+ FV DistalFull                                                 +---------+---------------+---------+-----------+----------+-------+  PFV      Full                                                 +---------+---------------+---------+-----------+----------+-------+ POP      Full           Yes      Yes                          +---------+---------------+---------+-----------+----------+-------+ PTV      Full                                                 +---------+---------------+---------+-----------+----------+-------+ PERO     Full                                                 +---------+---------------+---------+-----------+----------+-------+ A cystic structure with non-vascular feature seen at right popliteal fossa medial, measuring 2.54x3.24x0.85cm.  Left Venous Findings: +---------+---------------+---------+-----------+----------+-------+          CompressibilityPhasicitySpontaneityPropertiesSummary +---------+---------------+---------+-----------+----------+-------+ CFV      Full           Yes      Yes                          +---------+---------------+---------+-----------+----------+-------+ SFJ      Full                                                 +---------+---------------+---------+-----------+----------+-------+ FV Prox  Full                                                 +---------+---------------+---------+-----------+----------+-------+ FV Mid   Full                                                 +---------+---------------+---------+-----------+----------+-------+ FV DistalFull                                                  +---------+---------------+---------+-----------+----------+-------+ PFV      Full                                                 +---------+---------------+---------+-----------+----------+-------+ POP      Full           Yes      Yes                          +---------+---------------+---------+-----------+----------+-------+  PTV      Full                                                 +---------+---------------+---------+-----------+----------+-------+ PERO     Full                                                 +---------+---------------+---------+-----------+----------+-------+ A cystic structure with internal echoes, non-vascular feature seen at left popliteal fossa medial, measuring 1.36x2.09x0.98cm.    Summary: Right: There is no evidence of deep vein thrombosis in the lower extremity. Left: There is no evidence of deep vein thrombosis in the lower extremity.  *See table(s) above for measurements and observations. Electronically signed by Ruta Hinds MD on 02/12/2018 at 9:14:26 AM.    Final      Assessment/Plan: Diagnosis: Right MCA infarct with history of left MCA infarct. Labs and images (see above) independently reviewed.  Records reviewed and summated above.  1. Does the need for close, 24 hr/day medical supervision in concert with the patient's rehab needs make it unreasonable for this patient to be served in a less intensive setting? Yes 2. Co-Morbidities requiring supervision/potential complications: hx of CVA, HTN (monitor and provide prns in accordance with increased physical exertion and pain), CHF (Monitor in accordance with increased physical activity and avoid UE resistance excercises), CAD, cervical fusion, CKD (avoid nephrotoxic meds), ABLA (repeat labs, transfuse to ensure appropriate perfusion for increased activity tolerance) 3. Due to bladder management, bowel management, safety, skin/wound care, disease management, medication administration  and patient education, does the patient require 24 hr/day rehab nursing? Yes 4. Does the patient require coordinated care of a physician, rehab nurse, PT (1-2 hrs/day, 5 days/week), OT (1-2 hrs/day, 5 days/week) and SLP (1-2 hrs/day, 5 days/week) to address physical and functional deficits in the context of the above medical diagnosis(es)? Yes Addressing deficits in the following areas: balance, endurance, locomotion, strength, transferring, bowel/bladder control, bathing, dressing, feeding, grooming, toileting, cognition, speech, language, swallowing and psychosocial support 5. Can the patient actively participate in an intensive therapy program of at least 3 hrs of therapy per day at least 5 days per week? Potentially 6. The potential for patient to make measurable gains while on inpatient rehab is excellent 7. Anticipated functional outcomes upon discharge from inpatient rehab are min assist and mod assist  with PT, min assist and mod assist with OT, supervision and min assist with SLP. 8. Estimated rehab length of stay to reach the above functional goals is: 24-28 days. 9. Anticipated D/C setting: Other 10. Anticipated post D/C treatments: SNF 11. Overall Rehab/Functional Prognosis: good and fair  RECOMMENDATIONS: This patient's condition is appropriate for continued rehabilitative care in the following setting: CIR if adequate care giver support. Patient has agreed to participate in recommended program. Potentially Note that insurance prior authorization may be required for reimbursement for recommended care.  Comment: Rehab Admissions Coordinator to follow up.   Delice Lesch, MD, ABPMR 02/12/2018        Routing History

## 2018-02-16 NOTE — Progress Notes (Signed)
PMR Admission Coordinator Pre-Admission Assessment  Patient: Brandy [name] is an 77 y.o., female MRN: [id number] DOB: [birth year] Height: 5' 4"  (162.6 cm) Weight: 68.5 kg                                                                                                                                                  Insurance Information HMO:     PPO: Yes     PCP:      IPA:      80/20:      OTHER:  PRIMARY: U[employer] Medicare      Policy#: [id number]      Subscriber: Pateint CM Name: Dorthula Nettles      Phone#: [phone]     Fax#: [phone] Pre-Cert#: [id number]      Employer:  [employer]auf[employer] provided by Abigail Butts at [facility] for admit to CIR on 02/16/18. Follow up CM is Dorthula Nettles with clinical update due in 7 days (02/22/18) Benefits:  Phone #: NA     Name: [email or web address] Eff. Date: 03/29/17     Deduct: $0      Out of Pocket Max: $4,000 (Met $1,801.03)      Life Max: NA CIR: $160/day for days 1-10; $0/day for days 11+      SNF: $0/day for days 1-20; $50/day for days 21-100; 100 day limit Outpatient: per necessity     Co-Pay: $20/visit Home Health: 100%; per necessity      Co-Pay:  DME: 80%     Co-Pay: 20% Providers:  SECONDARY:       Policy#:       Subscriber:  CM Name:       Phone#:      Fax#:  Pre-Cert#:       Employer:  Benefits:  Phone #:      Name:  Eff. Date:      Deduct:      Out of Pocket Max:       Life Max:  CIR:       SNF:  Outpatient:     Co-Pay:  Home Health:       Co-Pay:  DME:      Co-Pay:   Medicaid Application Date:       Case Manager:  Disability Application Date:       Case Worker:   Emergency Contact Information         Contact Information    Name Relation Home Work Mobile   [place] [phone]  [phone]   No name specified         Current Medical History  Patient Admitting Diagnosis: Right MCA infarct with history of left MCA infarct.  History of Present Illness: Brandy [name] is a 77 year old right-handed female with history of  cardiomyopathy, chronic combined systolic and diastolic congestive heart failure, hypertension,COPD/ tobacco abuse, CVA 06/2017 to  [facility] hospital maintained on aspirin 162 mg daily. Per chart review patient lives with spouse. 2 level home with one step to entry. Reportedly independent prior to admission she still drives and prepares meals. She does have a housekeeper. Presented [facility] with left-sided weakness and right gaze preference. Cranial CT scan showed mixed pattern of both acute suspected subacute right hemisphere ischemia in the setting of hyperdense right M1 MCA and acute left side weakness consistent with emergent large vessel occlusion on the right proximal MCA. CT angiogram head and neck acute right M1 MCA occlusion. Patient was transferred to [facility] underwent mechanical thrombectomy minimal recannulization of occluded right MCA M1 segment per interventional radiology.Follow-upMRI the brain showed large area infarction right MCA territory as well as petechial hemorrhage within the right basal ganglia. Echocardiogram with ejection fraction of 40% diffuse hypokinesis. TEE completed 02/13/2018 with ejection fraction of 40% could not rule out early forming thrombus in the L AA versus artifact from posterior wall of the appendage. Multiple runs of nonsustained A. Tach and PACs were noted during procedure. Recommendations were for 30 day cardiac event monitor on discharge. Patient is maintained on aspirin and Plavix for CVA prophylaxis. Subcutaneous Lovenox for DVT prophylaxis. Dysphagia #1 thin liquid diet.Acute on chronic anemia 8.5 as well as platelets 120,000 and closely monitored. Therapy evaluations completed with recommendations of physical medicine rehabilitation consult. Patient is to be admitted for a comprehensive rehabilitation program on 02/16/18.  Complete NIHSS TOTAL: 8  Past Medical History      Past Medical History:  Diagnosis Date  . Cardiomyopathy      a. EF of 25% in 07/2006 b. EF normalized by repeat echo in 2011 c. EF 35-40% by echo in 05/2017 with cath showing mild nonobstructive CAD  . Chronic combined systolic and diastolic CHF (congestive heart failure) ([place])   . Colon polyps   . Diverticulosis of colon   . Hypertension   . Hypertensive heart disease   . Osteopenia   . TIA (transient ischemic attack)   . Tobacco abuse    50 pack year    Family History  family history includes Cancer in her paternal grandmother; Hypertension in her brother and maternal grandmother.  Prior Rehab/Hospitalizations:  Has the patient had major surgery during 100 days prior to admission? No  Current Medications   Current Facility-Administered Medications:  .  0.9 %  sodium chloride infusion, , Intravenous, Continuous, Rosalin Hawking, MD, Last Rate: 40 mL/hr at 02/15/18 2050 .  acetaminophen (TYLENOL) tablet 650 mg, 650 mg, Oral, Q4H PRN, 650 mg at 02/15/18 2023 **OR** [DISCONTINUED] acetaminophen (TYLENOL) solution 650 mg, 650 mg, Per Tube, Q4H PRN **OR** acetaminophen (TYLENOL) suppository 650 mg, 650 mg, Rectal, Q4H PRN, Vonzella Nipple, NP, 650 mg at 02/14/18 0756 .  aspirin tablet 325 mg, 325 mg, Oral, Daily, Laurey Morale N, NP, 325 mg at 02/16/18 1031 .  atorvastatin (LIPITOR) tablet 10 mg, 10 mg, Oral, q1800, Vonzella Nipple, NP, 10 mg at 02/15/18 1711 .  carvedilol (COREG) tablet 12.5 mg, 12.5 mg, Oral, BID WC, Laurey Morale N, NP, 12.5 mg at 02/16/18 0831 .  chlorhexidine (PERIDEX) 0.12 % solution 15 mL, 15 mL, Mouth Rinse, BID, Laurey Morale N, NP, 15 mL at 02/16/18 1030 .  clopidogrel (PLAVIX) tablet 75 mg, 75 mg, Oral, Daily, 75 mg at 02/16/18 1031 **OR** clopidogrel (PLAVIX) tablet 75 mg, 75 mg, Per Tube, Daily, Williams, Jessica N, NP .  donepezil (ARICEPT) tablet 5 mg, 5  mg, Oral, QHS, Rosalin Hawking, MD, 5 mg at 02/15/18 2316 .  enoxaparin (LOVENOX) injection 40 mg, 40 mg, Subcutaneous, Q24H, Williams,  Jessica N, NP, 40 mg at 02/16/18 1031 .  labetalol (NORMODYNE,TRANDATE) injection 10-20 mg, 10-20 mg, Intravenous, Q2H PRN, Vonzella Nipple, NP .  MEDLINE mouth rinse, 15 mL, Mouth Rinse, q12n4p, Vonzella Nipple, NP, 15 mL at 02/15/18 1712 .  senna-docusate (Senokot-S) tablet 1 tablet, 1 tablet, Oral, QHS PRN, Vonzella Nipple, NP .  umeclidinium-vilanterol (ANORO ELLIPTA) 62.5-25 MCG/INH 1 puff, 1 puff, Inhalation, Daily, Vonzella Nipple, NP, 1 puff at 02/14/18 1205  Patients Current Diet:     Diet Order                  Diet - low sodium heart healthy         DIET - DYS 1 Room service appropriate? 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   Home Assistive Devices / Equipment Home Assistive Devices/Equipment: None Home Equipment: Shower seat  Prior Device Use: Indicate devices/aids used by the patient prior to current illness, exacerbation or injury? None of the above  Prior Functional Level Prior Function Level of Independence: Independent Comments: community ambulator, drives, she cooks and has a Medical sales representative Care: Did the patient need help bathing, dressing, using the toilet or eating?  Independent  Indoor Mobility: Did the patient need assistance with walking from room to room (with or without device)? Independent  Stairs: Did the patient need assistance with internal or external stairs (with or without device)? Independent  Functional Cognition: Did the patient need help planning regular tasks such as shopping or remembering to take medications? Independent  Current Functional  Level Cognition  Arousal/Alertness: Lethargic Overall Cognitive Status: Impaired/Different from baseline Current Attention Level: Selective Orientation Level: Oriented X4 Safety/Judgement: Decreased awareness of deficits General Comments: left inattention Attention: Sustained Sustained Attention: Impaired Sustained Attention Impairment: Verbal basic, Functional basic Awareness: Impaired Awareness Impairment: Intellectual impairment Problem Solving: Impaired Problem Solving Impairment: Verbal basic, Functional basic Executive Function: (all areas impacted by lower level deficits)    Extremity Assessment (includes Sensation/Coordination)  Upper Extremity Assessment: LUE deficits/detail LUE Deficits / Details: Lt UE flaccid.  PROM WFL.  Brunnstrom stage I UE and hand   Lower Extremity Assessment: Defer to PT evaluation LLE Deficits / Details: no AROM noted, PROM WFL    ADLs  Overall ADL's : Needs assistance/impaired Eating/Feeding: Minimal assistance, Sitting Grooming: Wash/dry hands, Wash/dry face, Minimal assistance, Sitting Grooming Details (indicate cue type and reason): sitting EOB with assistance to maintain balance while reaching with R UE  Upper Body Bathing: Maximal assistance, Bed level Lower Body Bathing: Maximal assistance, Bed level Upper Body Dressing : Total assistance, Sitting, Bed level Lower Body Dressing: Total assistance, Bed level Toilet Transfer: Maximal assistance Toileting- Clothing Manipulation and Hygiene: Total assistance, Bed level Functional mobility during ADLs: Maximal assistance(bed mobility only) General ADL Comments: pt engaged in EOB grooming tasks, with L lateral lean requiring max physical assist and verbal cueing     Mobility  Overal bed mobility: Needs Assistance Bed Mobility: Rolling, Sidelying to Sit,  Sit to Sidelying Rolling: Max assist Sidelying to sit: Max assist Supine to sit: Max assist Sit to supine: Mod assist Sit to  sidelying: Mod assist General bed mobility comments: pt able to roll towards R with max assist, transition from sidelying to sitting given cueing for technique and hand placement with max assist for trunk and LB support, mod assist to transition back to sidelying for LB support only    Transfers  Overall transfer level: Needs assistance Transfers: Sit to/from Stand, Stand Pivot Transfers Sit to Stand: Mod assist, +2 physical assistance Stand pivot transfers: Max assist, +2 physical assistance General transfer comment: deferred due to fatigue    Ambulation / Gait / Stairs / Wheelchair Mobility  Ambulation/Gait General Gait Details: unable    Posture / Balance Dynamic Sitting Balance Sitting balance - Comments: mod-max assist to maintain midline posture at EOB with 0 hand support, but demonstrates ability to maintain midline with 1 hand support given min guard Balance Overall balance assessment: Needs assistance Sitting-balance support: No upper extremity supported, Feet supported Sitting balance-Leahy Scale: Poor Sitting balance - Comments: mod-max assist to maintain midline posture at EOB with 0 hand support, but demonstrates ability to maintain midline with 1 hand support given min guard Postural control: Left lateral lean, Posterior lean Standing balance-Leahy Scale: Zero    Special needs/care consideration BiPAP/CPAP: no CPM: no Continuous Drip IV: 0.9% sodium chloride infusion  Dialysis: no        Days: no Life Vest: no Oxygen: 1.5L [place]  Special Bed: no Trach Size: no Wound Vac (area): no      Location: NA Skin: right groin incision                           Bowel mgmt:incontinent, last BM: 02/14/18 Bladder mgmt:external catheter in place Diabetic mgmt: no     Previous Home Environment Living Arrangements: Spouse/significant other, Children  Lives With: Spouse Available Help at Discharge: Family, Available 24 hours/day Type of Home: House Home Layout: Two  level Alternate Level Stairs-Rails: Right, Left, Can reach both Alternate Level Stairs-Number of Steps: 12 steps to basement Home Access: Stairs to enter Entrance Stairs-Rails: None Entrance Stairs-Number of Steps: 1 Bathroom Shower/Tub: Multimedia programmer: Programmer, systems: Yes Home Care Services: No  Discharge Living Setting Plans for Discharge Living Setting: Alone, Lives with (comment)(husband) Type of Home at Discharge: House Discharge Home Layout: One level Discharge Home Access: Stairs to enter Entrance Stairs-Rails: Right Entrance Stairs-Number of Steps: 2x3 steps Discharge Bathroom Shower/Tub: Tub/shower unit, Walk-in shower Discharge Bathroom Toilet: Standard Discharge Bathroom Accessibility: Yes How Accessible: Accessible via walker Does the patient have any problems obtaining your medications?: No  Social/Family/Support Systems Patient Roles: Spouse, Other (Comment)(has grown children) Contact Information: husband [name]): [phone]; cell [phone]; Daughter Deboraha Sprang): [phone]; borther Randel Pigg): [phone] Anticipated Caregiver: husband, daughter, family and friends Anticipated Caregiver's Contact Information: see above Ability/Limitations of Caregiver: Mod A Caregiver Availability: 24/7 Discharge Plan Discussed with Primary Caregiver: Yes Is Caregiver In Agreement with Plan?: Yes Does Caregiver/Family have Issues with Lodging/Transportation while Pt is in Rehab?: No   Goals/Additional Needs Patient/Family Goal for Rehab: PT/OT: Min/Mod A; SLP: Supervision/Min A Expected length of stay: 24-28 days Cultural Considerations: NA Dietary Needs: Dys 1; thin liquids Equipment Needs: TBD Additional Information: family will need caregiver training on transfers Pt/Family Agrees to Admission and willing to participate: Yes Program Orientation Provided & Reviewed with Pt/Caregiver Including Roles  & Responsibilities:  Yes(with spouse, brother, and daughter)  Barriers to Discharge: Home environment access/layout, Other (comments)  Barriers to Discharge Comments: heavy assist anticipated; will need caregiver training on safe transfer techniques; steps to enter home.    Decrease burden of Care through IP rehab admission: Family will need pt to be a 1 person assist to return home   Possible need for SNF placement upon discharge: Not anticipated; family has stated strong social support with planned 24/7 A at home after CIR. Pt has good and fair prognosis for further progress through CIR.    Patient Condition: This patient's medical and functional status has changed since the consult dated: 02/11/18 in which the Rehabilitation Physician determined and documented that the patient's condition is appropriate for intensive rehabilitative care in an inpatient rehabilitation facility. See "History of Present Illness" (above) for medical update. Functional changes are: progression in diet to Dys 1 with thin liquids, progression in full standing attempts with pt requiring Mod Ax2, and toleration of standing attempts up to 2 minutes. Patient's medical and functional status update has been discussed with the Rehabilitation physician and patient remains appropriate for inpatient rehabilitation. Pt has adequate caregiver support from family with husband, daughter and family/friends willing to provide 24/7 A at DC. Will admit to inpatient rehab today.  Preadmission Screen Completed By:  Jhonnie Garner, 02/16/2018 1:58 PM ______________________________________________________________________   Discussed status with Dr. Letta Pate on 02/16/18 at 1:58PM and received telephone approval for admission today.  Admission Coordinator:  Jhonnie Garner, time 1:58PM/Date 02/16/18           Cosigned by: Charlett Blake, MD at 02/16/2018 3:40 PM  Revision History

## 2018-02-16 NOTE — Progress Notes (Signed)
Inpatient Rehabilitation-Admissions Coordinator   [facility] has received medical approval and insurance approval for admit to CIR today. AC has updated RN, SW, CM, pt, and her family regarding plans.   Please call if questions.   Jhonnie Garner, OTR/L  Rehab Admissions Coordinator  [phone] 02/16/2018 4:00 PM

## 2018-02-16 NOTE — Discharge Summary (Addendum)
Stroke Discharge Summary  Patient ID: Brandy [name]   MRN: [id number]      DOB: October 01, 1940  Date of Admission: 02/10/2018 Date of Discharge: 02/16/2018  Attending Physician:  Rosalin Hawking, MD, Stroke MD Consultant(s):   Cristopher Peru, MD EP and internal medicine Theresia Lo, MD Patient's PCP:  [name], MD  Discharge Diagnoses:  Active Problems:   Stroke [facility])   Middle cerebral artery embolism, right   Acute right MCA stroke ([place])   History of CVA with residual deficit   Benign essential HTN   Chronic combined systolic and diastolic congestive heart failure (HCC)   History of fusion of cervical spine   Stage 3 chronic kidney disease (HCC)   Acute blood loss anemia   Acute embolic stroke [facility])  Past Medical History:  Diagnosis Date  . Cardiomyopathy    a. EF of 25% in 07/2006 b. EF normalized by repeat echo in 2011 c. EF 35-40% by echo in 05/2017 with cath showing mild nonobstructive CAD  . Chronic combined systolic and diastolic CHF (congestive heart failure) ([place])   . Colon polyps   . Diverticulosis of colon   . Hypertension   . Hypertensive heart disease   . Osteopenia   . TIA (transient ischemic attack)   . Tobacco abuse    50 pack year   Past Surgical History:  Procedure Laterality Date  . BREAST BIOPSY    . CARPAL TUNNEL RELEASE     right hand  . CERVICAL FUSION    . CESAREAN SECTION     2 times  . COLONOSCOPY  7169   pt uncertain as to whether polypectomy required or performed  . ECTOPIC PREGNANCY SURGERY    . IR ANGIO VERTEBRAL SEL SUBCLAVIAN INNOMINATE UNI R MOD SED  02/10/2018  . IR CT HEAD LTD  02/10/2018  . IR PERCUTANEOUS ART THROMBECTOMY/INFUSION INTRACRANIAL INC DIAG ANGIO  02/10/2018  . RADIOLOGY WITH ANESTHESIA N/A 02/10/2018   Procedure: RADIOLOGY WITH ANESTHESIA;  Surgeon: Luanne Bras, MD;  Location: [place];  Service: Radiology;  Laterality: N/A;  . RIGHT/LEFT HEART CATH AND CORONARY ANGIOGRAPHY N/A 06/03/2017   Procedure:  RIGHT/LEFT HEART CATH AND CORONARY ANGIOGRAPHY;  Surgeon: Jettie Booze, MD;  Location: [place] CV LAB;  Service: Cardiovascular;  Laterality: N/A;  . TEE WITHOUT CARDIOVERSION N/A 02/13/2018   Procedure: TRANSESOPHAGEAL ECHOCARDIOGRAM (TEE);  Surgeon: Sueanne Margarita, MD;  Location: [facility] ENDOSCOPY;  Service: Cardiovascular;  Laterality: N/A;  . TONSILLECTOMY      Medications to be continued on Rehab Allergies as of 02/16/2018      Reactions   Lisinopril Swelling      Medication List    STOP taking these medications   aspirin 81 MG EC tablet Replaced by:  aspirin 325 MG tablet   BIDIL 20-37.5 MG tablet Generic drug:  isosorbide-hydrALAZINE   cetirizine 10 MG tablet Commonly known as:  ZYRTEC   furosemide 20 MG tablet Commonly known as:  LASIX   predniSONE 5 MG tablet Commonly known as:  DELTASONE   umeclidinium-vilanterol 62.5-25 MCG/INH Aepb Commonly known as:  ANORO ELLIPTA     TAKE these medications   aspirin 325 MG tablet Take 1 tablet (325 mg total) by mouth daily. Start taking on:  02/17/2018 Replaces:  aspirin 81 MG EC tablet   atorvastatin 10 MG tablet Commonly known as:  LIPITOR Take 1 tablet (10 mg total) by mouth daily at 6 PM. What changed:  See the new  instructions.   carvedilol 12.5 MG tablet Commonly known as:  COREG Take 1 tablet (12.5 mg total) by mouth 2 (two) times daily with a meal.   clopidogrel 75 MG tablet Commonly known as:  PLAVIX Take 1 tablet (75 mg total) by mouth daily. Start taking on:  02/17/2018   donepezil 5 MG tablet Commonly known as:  ARICEPT Take 1 tablet by mouth at bedtime.       LABORATORY STUDIES CBC    Component Value Date/Time   WBC 8.9 02/16/2018 0400   RBC 2.89 (L) 02/16/2018 0400   HGB 8.1 (L) 02/16/2018 0400   HCT 26.3 (L) 02/16/2018 0400   PLT 139 (L) 02/16/2018 0400   MCV 91.0 02/16/2018 0400   MCH 28.0 02/16/2018 0400   MCHC 30.8 02/16/2018 0400   RDW 14.6 02/16/2018 0400   LYMPHSABS  0.5 (L) 02/11/2018 0447   MONOABS 0.2 02/11/2018 0447   EOSABS 0.0 02/11/2018 0447   BASOSABS 0.0 02/11/2018 0447   CMP    Component Value Date/Time   NA 143 02/16/2018 0400   K 4.1 02/16/2018 0400   CL 116 (H) 02/16/2018 0400   CO2 23 02/16/2018 0400   GLUCOSE 87 02/16/2018 0400   BUN 12 02/16/2018 0400   CREATININE 1.04 (H) 02/16/2018 0400   CREATININE 1.27 (H) 08/04/2017 1259   CALCIUM 8.4 (L) 02/16/2018 0400   PROT 5.9 (L) 02/10/2018 1036   ALBUMIN 3.5 02/10/2018 1036   AST 18 02/10/2018 1036   ALT 14 02/10/2018 1036   ALKPHOS 54 02/10/2018 1036   BILITOT 0.8 02/10/2018 1036   GFRNONAA 51 (L) 02/16/2018 0400   GFRAA 59 (L) 02/16/2018 0400   COAGS Lab Results  Component Value Date   INR 1.17 02/10/2018   INR 1.07 07/12/2017   INR 1.23 06/03/2017   Lipid Panel    Component Value Date/Time   CHOL 93 02/11/2018 0447   TRIG 42 02/11/2018 0447   HDL 33 (L) 02/11/2018 0447   CHOLHDL 2.8 02/11/2018 0447   VLDL 8 02/11/2018 0447   LDLCALC 52 02/11/2018 0447   HgbA1C  Lab Results  Component Value Date   HGBA1C 4.7 (L) 02/11/2018   Urinalysis    Component Value Date/Time   COLORURINE YELLOW 02/14/2018 1106   APPEARANCEUR CLEAR 02/14/2018 1106   LABSPEC 1.019 02/14/2018 1106   PHURINE 5.0 02/14/2018 1106   GLUCOSEU NEGATIVE 02/14/2018 1106   HGBUR NEGATIVE 02/14/2018 1106   BILIRUBINUR NEGATIVE 02/14/2018 1106   KETONESUR 80 (A) 02/14/2018 1106   PROTEINUR NEGATIVE 02/14/2018 1106   NITRITE NEGATIVE 02/14/2018 1106   LEUKOCYTESUR NEGATIVE 02/14/2018 1106   Urine Drug Screen     Component Value Date/Time   LABOPIA NONE DETECTED 02/10/2018 2000   COCAINSCRNUR NONE DETECTED 02/10/2018 2000   LABBENZ NONE DETECTED 02/10/2018 2000   AMPHETMU NONE DETECTED 02/10/2018 2000   THCU NONE DETECTED 02/10/2018 2000   LABBARB NONE DETECTED 02/10/2018 2000    Alcohol Level    Component Value Date/Time   ETH <10 02/10/2018 1036     SIGNIFICANT DIAGNOSTIC  STUDIES Ct Angio Head W Or Wo Contrast  Result Date: 02/10/2018 CLINICAL DATA:  Acute onset of LEFT-sided weakness. Slurred speech. Last seen normal earlier 0930 hours. EXAM: CT ANGIOGRAPHY HEAD AND NECK CT PERFUSION BRAIN TECHNIQUE: Multidetector CT imaging of the head and neck was performed using the standard protocol during bolus administration of intravenous contrast. Multiplanar CT image reconstructions and MIPs were obtained to evaluate the vascular anatomy. Carotid  stenosis measurements (when applicable) are obtained utilizing NASCET criteria, using the distal internal carotid diameter as the denominator. Multiphase CT imaging of the brain was performed following IV bolus contrast injection. Subsequent parametric perfusion maps were calculated using RAPID software. CONTRAST:  168mL ISOVUE-370 IOPAMIDOL (ISOVUE-370) INJECTION 76% COMPARISON:  Code stroke CT head earlier today. FINDINGS: CTA NECK FINDINGS Aortic arch: Standard branching. Imaged portion shows no evidence of aneurysm or dissection. No significant stenosis of the major arch vessel origins. Right carotid system: No evidence of dissection, stenosis (50% or greater) or occlusion. Left carotid system: No evidence of dissection, stenosis (50% or greater) or occlusion. Vertebral arteries: Codominant. No evidence of dissection, stenosis (50% or greater) or occlusion. Skeleton: Spondylosis.  Prior C5-C7 ACDF. Other neck: No masses. Upper chest: Mild edema.  No pneumothorax. Review of the MIP images confirms the above findings CTA HEAD FINDINGS Anterior circulation: Minor calcific atheromatous change in the LEFT carotid siphon. No significant calcification on the RIGHT. ICA termini widely patent. Acute RIGHT M1 MCA occlusion. No similar findings on the LEFT. Collateralization provides fair to good opacification of the M2 and M3 branches on the RIGHT. Posterior circulation: No significant stenosis, proximal occlusion, aneurysm, or vascular malformation.  Venous sinuses: As permitted by contrast timing, patent. Anatomic variants: None significant. Delayed phase: Not performed. Review of the MIP images confirms the above findings CT Brain Perfusion Findings: CBF (<30%) Volume: 9.94mL Perfusion (Tmax>6.0s) volume: 102.42mL Mismatch Volume: 93.8mL Infarction Location:RIGHT basal ganglia, and regional white matter. IMPRESSION: Acute RIGHT M1 MCA occlusion.  Fair to good collateralization. No extracranial stenosis/dissection or carotid siphon stenosis. Significant mismatch volume of 93 mL, encompassing much of the RIGHT MCA territory. See discussion above. These results were called by telephone at the time of interpretation on 02/10/2018 at 11:13 am to Dr. Francine Graven , who verbally acknowledged these results. Electronically Signed   By: Staci Righter M.D.   On: 02/10/2018 11:36   Ct Angio Neck W Or Wo Contrast  Result Date: 02/10/2018 CLINICAL DATA:  Acute onset of LEFT-sided weakness. Slurred speech. Last seen normal earlier 0930 hours. EXAM: CT ANGIOGRAPHY HEAD AND NECK CT PERFUSION BRAIN TECHNIQUE: Multidetector CT imaging of the head and neck was performed using the standard protocol during bolus administration of intravenous contrast. Multiplanar CT image reconstructions and MIPs were obtained to evaluate the vascular anatomy. Carotid stenosis measurements (when applicable) are obtained utilizing NASCET criteria, using the distal internal carotid diameter as the denominator. Multiphase CT imaging of the brain was performed following IV bolus contrast injection. Subsequent parametric perfusion maps were calculated using RAPID software. CONTRAST:  11mL ISOVUE-370 IOPAMIDOL (ISOVUE-370) INJECTION 76% COMPARISON:  Code stroke CT head earlier today. FINDINGS: CTA NECK FINDINGS Aortic arch: Standard branching. Imaged portion shows no evidence of aneurysm or dissection. No significant stenosis of the major arch vessel origins. Right carotid system: No evidence of  dissection, stenosis (50% or greater) or occlusion. Left carotid system: No evidence of dissection, stenosis (50% or greater) or occlusion. Vertebral arteries: Codominant. No evidence of dissection, stenosis (50% or greater) or occlusion. Skeleton: Spondylosis.  Prior C5-C7 ACDF. Other neck: No masses. Upper chest: Mild edema.  No pneumothorax. Review of the MIP images confirms the above findings CTA HEAD FINDINGS Anterior circulation: Minor calcific atheromatous change in the LEFT carotid siphon. No significant calcification on the RIGHT. ICA termini widely patent. Acute RIGHT M1 MCA occlusion. No similar findings on the LEFT. Collateralization provides fair to good opacification of the M2 and M3 branches on  the RIGHT. Posterior circulation: No significant stenosis, proximal occlusion, aneurysm, or vascular malformation. Venous sinuses: As permitted by contrast timing, patent. Anatomic variants: None significant. Delayed phase: Not performed. Review of the MIP images confirms the above findings CT Brain Perfusion Findings: CBF (<30%) Volume: 9.67mL Perfusion (Tmax>6.0s) volume: 102.58mL Mismatch Volume: 93.33mL Infarction Location:RIGHT basal ganglia, and regional white matter. IMPRESSION: Acute RIGHT M1 MCA occlusion.  Fair to good collateralization. No extracranial stenosis/dissection or carotid siphon stenosis. Significant mismatch volume of 93 mL, encompassing much of the RIGHT MCA territory. See discussion above. These results were called by telephone at the time of interpretation on 02/10/2018 at 11:13 am to Dr. Francine Graven , who verbally acknowledged these results. Electronically Signed   By: Staci Righter M.D.   On: 02/10/2018 11:36   Mr Brain Wo Contrast  Result Date: 02/12/2018 CLINICAL DATA:  Stroke follow-up.  Right M1 occlusion. EXAM: MRI HEAD WITHOUT CONTRAST MRA HEAD WITHOUT CONTRAST TECHNIQUE: Multiplanar, multiecho pulse sequences of the brain and surrounding structures were obtained without  intravenous contrast. Angiographic images of the head were obtained using MRA technique without contrast. COMPARISON:  Head CT/CTA 02/10/2018 FINDINGS: MRI HEAD FINDINGS BRAIN: There is multifocal diffusion restriction throughout the right MCA territory, predominantly within the right basal ganglia and insula. There is mild mass effect on the frontal horn of the right lateral ventricle. The midline structures are normal. There is cytotoxic edema within the right MCA distribution according to the areas ischemia. There is petechial hemorrhage within the right basal ganglia. The CSF spaces are normal for age, with no hydrocephalus. Susceptibility-sensitive sequences show no chronic microhemorrhage or superficial siderosis. SKULL AND UPPER CERVICAL SPINE: The visualized skull base, calvarium, upper cervical spine and extracranial soft tissues are normal. SINUSES/ORBITS: No fluid levels or advanced mucosal thickening. No mastoid or middle ear effusion. The orbits are normal. MRA HEAD FINDINGS Intracranial internal carotid arteries: Normal. Anterior cerebral arteries: Normal. Middle cerebral arteries: The M1 segment of the right middle cerebral artery remains occluded. There is return of flow related enhancement within the proximal M2 branches. Left MCA is normal. Posterior communicating arteries: Not visualized Posterior cerebral arteries: Normal. Basilar artery: Normal. Vertebral arteries: Codominant.  Normal. Superior cerebellar arteries: Normal. Inferior cerebellar arteries: Normal. IMPRESSION: 1. Persistent occlusion of the right middle cerebral artery M1 segment. There is flow related enhancement within the right M2 branches. 2. Large area infarction within the right middle cerebral artery territory, predominantly involving the anterior insula and the basal ganglia. 3. Petechial hemorrhage within the right basal ganglia. Heidelberg classification 1b: HI2, confluent petechiae, no mass effect. Electronically Signed    By: Ulyses Jarred M.D.   On: 02/12/2018 00:25   [place]  Result Date: 02/14/2018 INDICATION: Right gaze deviation. Left-sided hemiplegia. CT angiogram head and neck revealing a right middle cerebral artery proximal occlusion. EXAM: 1. EMERGENT LARGE VESSEL OCCLUSION THROMBOLYSIS (anterior CIRCULATION) COMPARISON:  CT angiogram of the head and neck of 02/10/2018. MEDICATIONS: Ancef 2 g IV antibiotic was administered within 1 hour of the procedure. ANESTHESIA/SEDATION: General anesthesia CONTRAST:  Isovue 300 approximately 115 mL FLUOROSCOPY TIME:  Fluoroscopy Time: 115 minutes 42 seconds (5540 mGy). COMPLICATIONS: None immediate. TECHNIQUE: Following a full explanation of the procedure along with the potential associated complications, an informed witnessed consent was obtained from the patient is husband. The risks of intracranial hemorrhage of 10%, worsening neurological deficit, ventilator dependency, death and inability to revascularize were all reviewed in detail with the patient's husband. The patient was  then put under general anesthesia by the Department of Anesthesiology at [facility]. The right groin was prepped and draped in the usual sterile fashion. Thereafter using modified Seldinger technique, transfemoral access into the right common femoral artery was obtained without difficulty. Over a 0.035 inch guidewire a 5 French Pinnacle sheath was inserted. Through this, and also over a 0.035 inch guidewire a 5 [place] JB 1 catheter was advanced to the aortic arch region and selectively positioned in the innominate artery and the right common carotid artery. FINDINGS: The innominate artery injection demonstrates the origin of the right subclavian artery and the right common carotid artery to be widely patent. The right vertebral artery origin is patent. The vessel is seen to opacify to the cranial skull base. Normal opacification is seen of the right vertebrobasilar junction, the right  posterior-inferior cerebellar artery and the proximal basilar artery on lateral projections. The right common carotid arteriogram demonstrates the right external carotid artery and its major branches to be widely patent. The right internal carotid artery at the bulb to the cranial skull base demonstrates wide patency. The petrous segment demonstrates normal caliber. There is a sharp U shaped cavernous carotid leading to the right supraclinoid ICA which demonstrates a moderate stenosis just proximal to the right ICA terminus. The right middle cerebral artery demonstrates complete occlusion in the proximal M1 segment. A blush of contrast is noted emanating from the mid right M1 segment projecting into the regions of the basal ganglia. The delayed arterial phase demonstrates partial reconstitution of the right MCA distal branches from leptomeningeal collaterals arising from the pericallosal and the callosal marginal branches of the right anterior cerebral artery. The right anterior cerebral artery demonstrates normal opacification into the capillary and venous phases. PROCEDURE: The diagnostic JB 1 catheter in the right common carotid artery was exchanged over a 0.035 inch 300 cm Rosen exchange guidewire for an 8 French 55 cm Brite tip neurovascular sheath. Good aspiration was obtained from the hub of the neurovascular sheath. This was then connected to continuous heparinized saline infusion. Over the [facility] exchange guidewire, an 85 cm 8 [place] FlowGate balloon guide catheter which had been prepped with 50% contrast and 50% heparinized saline infusion was advanced and positioned just proximal to the right common carotid bifurcation. The guidewire was removed. Good aspiration obtained from the hub of the [facility] guide catheter. A gentle control arteriogram demonstrates patency of the vessel distally. No evidence of focal intraluminal filling defects or dissections or spasm was noted. Over a 0.014 inch Softip Synchro  micro guidewire, a combination of a 115 cm Navien guide catheter inside of which was an 021 Trevo ProVue microcatheter was advanced to the distal end of the [facility] guide catheter. The latter had been advanced to the mid cervical right ICA. With the micro guidewire leading with a J-tip configuration, the combination was navigated to the supraclinoid right ICA. Using a torque device, the micro guidewire was then gently manipulated meeting moderate resistance to the advancement of the micro guidewire. Eventual manipulation with a torque device allowed opacification into a superior division branch M2 M3 region followed by the microcatheter. The guidewire was removed. There was slow aspiration of contrast from the hub of the microcatheter. The microcatheter was then gently retrieved more proximally until free aspiration of blood was noted. This was then connected to continuous heparinized saline infusion. A 5 mm x 33 mm Embotrap retrieval device was then advanced to the distal end of the microcatheter using biplane roadmap technique  and constant fluoroscopic guidance in a coaxial manner and with constant heparinized saline infusion. The proximal and the distal landing zones were identified. The O ring on the delivery microcatheter was then loosened. With slight forward gentle traction with the right hand on the delivery micro guidewire, the microcatheter was retrieved unsheathing the distal and then the proximal portion of the device. A control arteriogram performed through the Navien guide catheter in the supraclinoid right ICA demonstrated no significant visualization of the right middle cerebral artery branches. Proximal flow arrest was then initiated in the mid right ICA cervical region by inflating the balloon at the tip of the [facility] guide catheter. Thereafter, as constant aspiration was applied with a 60 mL syringe at the hub of the [facility] guide catheter, and with a vacuum pump at the hub of the Navien guide  catheter, the combination of the retrieval device, the microcatheter and the Navien guide catheter were then retrieved and removed. Aspiration was continued into the hub of the [facility] guide catheter as the flow arrest was reversed in the right internal carotid artery. Free back bleed of blood was noted at the hub of the [facility] guide catheter. Gentle control arteriogram performed through the right internal carotid artery continued to demonstrate occlusion of the right middle cerebral artery. A second pass was then made using the above combination and advanced to the supraclinoid right ICA. The micro guidewire was then gently manipulated using a torque device and advanced through the occluded right middle cerebral and into the M2 M3 region of the inferior division. Good aspiration was obtained from the hub of the microcatheter. Gentle contrast injection demonstrated safe position of tip of the microcatheter. This was then connected to continuous heparinized saline infusion. A 5 mm x 33 mm Embotrap retrieval device was again advanced the distal end of the microcatheter. The proximal and the distal landing zones were then defined. The device was then deployed as mentioned above. A control arteriogram performed through the Navien guide catheter in the right internal carotid artery demonstrated a revascularization with diminutive right MCA branches. With proximal flow arrest in the right internal carotid artery, the combination of the retrieval device, the microcatheter and the Navien guide catheter were retrieved and removed as constant aspiration was applied with a 60 mL syringe at the hub of the [facility] guide catheter, and with a Penumbra suction vacuum pump at the hub of the 5 [place] Navien guide catheter. Proximal flow arrest was then reversed by deflating the balloon of the [facility] guide catheter in the right internal carotid artery. Free back bleed of blood was noted at the hub of the [facility] guide catheter.  A gentle control arteriogram performed through the 8 [place] FlowGate guide catheter in the right internal carotid artery demonstrated minimal improved flow in the proximal right middle cerebral artery. Delayed arterial phase demonstrated slow opacification of the right middle cerebral artery branches in the trifurcation region. Also noted was a blush of contrast emanating from the recurrent artery of [place] from the proximal right anterior cerebral A1 segment, and also the anterior temporal branch and the numerous fine branches arising from the right middle cerebral M1 segment. There were a few streaks of clot noted in the retrieval device. At this time a control arteriogram also demonstrated a focal smooth outpouching in the petrous segment of the right internal carotid artery proximally. Access distal to the dissection was eventually obtained over a micro guidewire using a Trevo ProVue 021 microcatheter. This was then connected to  continuous heparinized saline infusion. Attempts were then made to place a stent across the flap which was nonobstructive. However, significant tortuosity in the aortic arch, and also the right internal carotid artery proximally precluded with deployment of the device. This was then retrieved and removed. Upon removal the flap was noted to have been tacked to the native vessel with excellent flow passed the native vessel. An arteriogram performed through [facility] guide catheter in the right internal carotid artery demonstrated brisk flow into the right internal carotid artery cranial skull base and the right internal carotid artery supraclinoid segment. Free flow was noted in the right anterior cerebral artery. Also demonstrated at this time was flow through the previously occluded right middle cerebral artery with filling defect proximally, leading to a tapered severe occlusion associated with a blush of contrast. The delayed arterial phase demonstrated progressive retrograde  opacification of the peri-insular branches. Given the above an angio findings, and the retrograde opacification of the distal right middle cerebral artery M3 M4 branches, it was decided to stop. A final control arteriogram performed through the [facility] guide catheter in the right internal carotid artery demonstrated excellent flow through the site of the previously noted dissection without evidence of stenosis or of intraluminal filling defects. More distally, there continued to be occlusion of the right middle cerebral artery with slow permeation of contrast in the proximal right middle cerebral artery M1 segment. The delayed arterial phase continued to demonstrate retrograde opacification of the right MCA distribution M3 M4 and perisylvian branches. The FlowGate guide catheter was then retrieved into the abdominal aorta and with the neurovascular sheath, was placed over a 0.035 inch guidewire was for an 8 [place] Pinnacle sheath. This was then removed with successful closure with a 7 [place] Exo-Seal closure device. The right groin appeared soft with distal pulses being Dopplerable in the posterior tibial, in the dorsalis pedis arteries bilaterally unchanged. The patient was then extubated without difficulty. A CT scan of the brain revealed no evidence of a gross intracranial hemorrhage or mass effect or midline shift. There was a focal hyper density in the proximal sylvian fissure at site of the origin and proximal right MCA. It was felt this could represent a focal area of contrast in the thrombus itself with a possible small amount of subarachnoid leakage. The patient was then extubated without difficulty. Upon recovery, the patient was able to obey simple commands like opening her eyes to name and move her right lower extremity to command. She was then transferred to neuro ICU for further post thrombectomy management. IMPRESSION: Status post endovascular revascularization of occluded right middle cerebral  artery with 2 passes using the 5 mm x 33 mm Embotrap retrieval device achieving a TICI 2b revascularization with delayed closure and angiographic evidence of slow revascularization from collaterals arising from the recurrent artery of Heubner, and the lateral lenticulostriate branches, and also the pericallosal and the callosal marginal branches of the right anterior cerebral artery. Focal small dissection in the right internal carotid artery petrous segment intact without intraluminal filling defects or stenosis. PLAN: Follow-up in the clinic approximately 6 weeks post discharge. Electronically Signed   By: Luanne Bras M.D.   On: 02/13/2018 11:14   Ct Cerebral Perfusion W Contrast  Result Date: 02/10/2018 CLINICAL DATA:  Acute onset of LEFT-sided weakness. Slurred speech. Last seen normal earlier 0930 hours. EXAM: CT ANGIOGRAPHY HEAD AND NECK CT PERFUSION BRAIN TECHNIQUE: Multidetector CT imaging of the head and neck was performed using the standard protocol during  bolus administration of intravenous contrast. Multiplanar CT image reconstructions and MIPs were obtained to evaluate the vascular anatomy. Carotid stenosis measurements (when applicable) are obtained utilizing NASCET criteria, using the distal internal carotid diameter as the denominator. Multiphase CT imaging of the brain was performed following IV bolus contrast injection. Subsequent parametric perfusion maps were calculated using RAPID software. CONTRAST:  148mL ISOVUE-370 IOPAMIDOL (ISOVUE-370) INJECTION 76% COMPARISON:  Code stroke CT head earlier today. FINDINGS: CTA NECK FINDINGS Aortic arch: Standard branching. Imaged portion shows no evidence of aneurysm or dissection. No significant stenosis of the major arch vessel origins. Right carotid system: No evidence of dissection, stenosis (50% or greater) or occlusion. Left carotid system: No evidence of dissection, stenosis (50% or greater) or occlusion. Vertebral arteries: Codominant.  No evidence of dissection, stenosis (50% or greater) or occlusion. Skeleton: Spondylosis.  Prior C5-C7 ACDF. Other neck: No masses. Upper chest: Mild edema.  No pneumothorax. Review of the MIP images confirms the above findings CTA HEAD FINDINGS Anterior circulation: Minor calcific atheromatous change in the LEFT carotid siphon. No significant calcification on the RIGHT. ICA termini widely patent. Acute RIGHT M1 MCA occlusion. No similar findings on the LEFT. Collateralization provides fair to good opacification of the M2 and M3 branches on the RIGHT. Posterior circulation: No significant stenosis, proximal occlusion, aneurysm, or vascular malformation. Venous sinuses: As permitted by contrast timing, patent. Anatomic variants: None significant. Delayed phase: Not performed. Review of the MIP images confirms the above findings CT Brain Perfusion Findings: CBF (<30%) Volume: 9.39mL Perfusion (Tmax>6.0s) volume: 102.57mL Mismatch Volume: 93.59mL Infarction Location:RIGHT basal ganglia, and regional white matter. IMPRESSION: Acute RIGHT M1 MCA occlusion.  Fair to good collateralization. No extracranial stenosis/dissection or carotid siphon stenosis. Significant mismatch volume of 93 mL, encompassing much of the RIGHT MCA territory. See discussion above. These results were called by telephone at the time of interpretation on 02/10/2018 at 11:13 am to Dr. Francine Graven , who verbally acknowledged these results. Electronically Signed   By: Staci Righter M.D.   On: 02/10/2018 11:36   Mr Jodene Nam Head Wo Contrast  Result Date: 02/12/2018 CLINICAL DATA:  Stroke follow-up.  Right M1 occlusion. EXAM: MRI HEAD WITHOUT CONTRAST MRA HEAD WITHOUT CONTRAST TECHNIQUE: Multiplanar, multiecho pulse sequences of the brain and surrounding structures were obtained without intravenous contrast. Angiographic images of the head were obtained using MRA technique without contrast. COMPARISON:  Head CT/CTA 02/10/2018 FINDINGS: MRI HEAD  FINDINGS BRAIN: There is multifocal diffusion restriction throughout the right MCA territory, predominantly within the right basal ganglia and insula. There is mild mass effect on the frontal horn of the right lateral ventricle. The midline structures are normal. There is cytotoxic edema within the right MCA distribution according to the areas ischemia. There is petechial hemorrhage within the right basal ganglia. The CSF spaces are normal for age, with no hydrocephalus. Susceptibility-sensitive sequences show no chronic microhemorrhage or superficial siderosis. SKULL AND UPPER CERVICAL SPINE: The visualized skull base, calvarium, upper cervical spine and extracranial soft tissues are normal. SINUSES/ORBITS: No fluid levels or advanced mucosal thickening. No mastoid or middle ear effusion. The orbits are normal. MRA HEAD FINDINGS Intracranial internal carotid arteries: Normal. Anterior cerebral arteries: Normal. Middle cerebral arteries: The M1 segment of the right middle cerebral artery remains occluded. There is return of flow related enhancement within the proximal M2 branches. Left MCA is normal. Posterior communicating arteries: Not visualized Posterior cerebral arteries: Normal. Basilar artery: Normal. Vertebral arteries: Codominant.  Normal. Superior cerebellar arteries: Normal. Inferior cerebellar arteries:  Normal. IMPRESSION: 1. Persistent occlusion of the right middle cerebral artery M1 segment. There is flow related enhancement within the right M2 branches. 2. Large area infarction within the right middle cerebral artery territory, predominantly involving the anterior insula and the basal ganglia. 3. Petechial hemorrhage within the right basal ganglia. Heidelberg classification 1b: HI2, confluent petechiae, no mass effect. Electronically Signed   By: Ulyses Jarred M.D.   On: 02/12/2018 00:25   Ir Percutaneous Art Thrombectomy/infusion Intracranial Inc Diag Angio  Result Date: 02/14/2018 INDICATION:  Right gaze deviation. Left-sided hemiplegia. CT angiogram head and neck revealing a right middle cerebral artery proximal occlusion. EXAM: 1. EMERGENT LARGE VESSEL OCCLUSION THROMBOLYSIS (anterior CIRCULATION) COMPARISON:  CT angiogram of the head and neck of 02/10/2018. MEDICATIONS: Ancef 2 g IV antibiotic was administered within 1 hour of the procedure. ANESTHESIA/SEDATION: General anesthesia CONTRAST:  Isovue 300 approximately 115 mL FLUOROSCOPY TIME:  Fluoroscopy Time: 115 minutes 42 seconds (5540 mGy). COMPLICATIONS: None immediate. TECHNIQUE: Following a full explanation of the procedure along with the potential associated complications, an informed witnessed consent was obtained from the patient is husband. The risks of intracranial hemorrhage of 10%, worsening neurological deficit, ventilator dependency, death and inability to revascularize were all reviewed in detail with the patient's husband. The patient was then put under general anesthesia by the Department of Anesthesiology at [facility]. The right groin was prepped and draped in the usual sterile fashion. Thereafter using modified Seldinger technique, transfemoral access into the right common femoral artery was obtained without difficulty. Over a 0.035 inch guidewire a 5 [place] Pinnacle sheath was inserted. Through this, and also over a 0.035 inch guidewire a 5 [place] JB 1 catheter was advanced to the aortic arch region and selectively positioned in the innominate artery and the right common carotid artery. FINDINGS: The innominate artery injection demonstrates the origin of the right subclavian artery and the right common carotid artery to be widely patent. The right vertebral artery origin is patent. The vessel is seen to opacify to the cranial skull base. Normal opacification is seen of the right vertebrobasilar junction, the right posterior-inferior cerebellar artery and the proximal basilar artery on lateral projections. The right common  carotid arteriogram demonstrates the right external carotid artery and its major branches to be widely patent. The right internal carotid artery at the bulb to the cranial skull base demonstrates wide patency. The petrous segment demonstrates normal caliber. There is a sharp U shaped cavernous carotid leading to the right supraclinoid ICA which demonstrates a moderate stenosis just proximal to the right ICA terminus. The right middle cerebral artery demonstrates complete occlusion in the proximal M1 segment. A blush of contrast is noted emanating from the mid right M1 segment projecting into the regions of the basal ganglia. The delayed arterial phase demonstrates partial reconstitution of the right MCA distal branches from leptomeningeal collaterals arising from the pericallosal and the callosal marginal branches of the right anterior cerebral artery. The right anterior cerebral artery demonstrates normal opacification into the capillary and venous phases. PROCEDURE: The diagnostic JB 1 catheter in the right common carotid artery was exchanged over a 0.035 inch 300 cm Rosen exchange guidewire for an 8 French 55 cm Brite tip neurovascular sheath. Good aspiration was obtained from the hub of the neurovascular sheath. This was then connected to continuous heparinized saline infusion. Over the [facility] exchange guidewire, an 85 cm 8 [place] FlowGate balloon guide catheter which had been prepped with 50% contrast and 50% heparinized saline infusion was  advanced and positioned just proximal to the right common carotid bifurcation. The guidewire was removed. Good aspiration obtained from the hub of the [facility] guide catheter. A gentle control arteriogram demonstrates patency of the vessel distally. No evidence of focal intraluminal filling defects or dissections or spasm was noted. Over a 0.014 inch Softip Synchro micro guidewire, a combination of a 115 cm Navien guide catheter inside of which was an 021 Trevo ProVue  microcatheter was advanced to the distal end of the [facility] guide catheter. The latter had been advanced to the mid cervical right ICA. With the micro guidewire leading with a J-tip configuration, the combination was navigated to the supraclinoid right ICA. Using a torque device, the micro guidewire was then gently manipulated meeting moderate resistance to the advancement of the micro guidewire. Eventual manipulation with a torque device allowed opacification into a superior division branch M2 M3 region followed by the microcatheter. The guidewire was removed. There was slow aspiration of contrast from the hub of the microcatheter. The microcatheter was then gently retrieved more proximally until free aspiration of blood was noted. This was then connected to continuous heparinized saline infusion. A 5 mm x 33 mm Embotrap retrieval device was then advanced to the distal end of the microcatheter using biplane roadmap technique and constant fluoroscopic guidance in a coaxial manner and with constant heparinized saline infusion. The proximal and the distal landing zones were identified. The O ring on the delivery microcatheter was then loosened. With slight forward gentle traction with the right hand on the delivery micro guidewire, the microcatheter was retrieved unsheathing the distal and then the proximal portion of the device. A control arteriogram performed through the Navien guide catheter in the supraclinoid right ICA demonstrated no significant visualization of the right middle cerebral artery branches. Proximal flow arrest was then initiated in the mid right ICA cervical region by inflating the balloon at the tip of the [facility] guide catheter. Thereafter, as constant aspiration was applied with a 60 mL syringe at the hub of the [facility] guide catheter, and with a vacuum pump at the hub of the Navien guide catheter, the combination of the retrieval device, the microcatheter and the Navien guide catheter were  then retrieved and removed. Aspiration was continued into the hub of the [facility] guide catheter as the flow arrest was reversed in the right internal carotid artery. Free back bleed of blood was noted at the hub of the [facility] guide catheter. Gentle control arteriogram performed through the right internal carotid artery continued to demonstrate occlusion of the right middle cerebral artery. A second pass was then made using the above combination and advanced to the supraclinoid right ICA. The micro guidewire was then gently manipulated using a torque device and advanced through the occluded right middle cerebral and into the M2 M3 region of the inferior division. Good aspiration was obtained from the hub of the microcatheter. Gentle contrast injection demonstrated safe position of tip of the microcatheter. This was then connected to continuous heparinized saline infusion. A 5 mm x 33 mm Embotrap retrieval device was again advanced the distal end of the microcatheter. The proximal and the distal landing zones were then defined. The device was then deployed as mentioned above. A control arteriogram performed through the Navien guide catheter in the right internal carotid artery demonstrated a revascularization with diminutive right MCA branches. With proximal flow arrest in the right internal carotid artery, the combination of the retrieval device, the microcatheter and the Navien guide catheter were  retrieved and removed as constant aspiration was applied with a 60 mL syringe at the hub of the [facility] guide catheter, and with a Penumbra suction vacuum pump at the hub of the 5 [place] Navien guide catheter. Proximal flow arrest was then reversed by deflating the balloon of the [facility] guide catheter in the right internal carotid artery. Free back bleed of blood was noted at the hub of the [facility] guide catheter. A gentle control arteriogram performed through the 8 [place] FlowGate guide catheter in the right  internal carotid artery demonstrated minimal improved flow in the proximal right middle cerebral artery. Delayed arterial phase demonstrated slow opacification of the right middle cerebral artery branches in the trifurcation region. Also noted was a blush of contrast emanating from the recurrent artery of Heubner from the proximal right anterior cerebral A1 segment, and also the anterior temporal branch and the numerous fine branches arising from the right middle cerebral M1 segment. There were a few streaks of clot noted in the retrieval device. At this time a control arteriogram also demonstrated a focal smooth outpouching in the petrous segment of the right internal carotid artery proximally. Access distal to the dissection was eventually obtained over a micro guidewire using a Trevo ProVue 021 microcatheter. This was then connected to continuous heparinized saline infusion. Attempts were then made to place a stent across the flap which was nonobstructive. However, significant tortuosity in the aortic arch, and also the right internal carotid artery proximally precluded with deployment of the device. This was then retrieved and removed. Upon removal the flap was noted to have been tacked to the native vessel with excellent flow passed the native vessel. An arteriogram performed through [facility] guide catheter in the right internal carotid artery demonstrated brisk flow into the right internal carotid artery cranial skull base and the right internal carotid artery supraclinoid segment. Free flow was noted in the right anterior cerebral artery. Also demonstrated at this time was flow through the previously occluded right middle cerebral artery with filling defect proximally, leading to a tapered severe occlusion associated with a blush of contrast. The delayed arterial phase demonstrated progressive retrograde opacification of the peri-insular branches. Given the above an angio findings, and the retrograde  opacification of the distal right middle cerebral artery M3 M4 branches, it was decided to stop. A final control arteriogram performed through the [facility] guide catheter in the right internal carotid artery demonstrated excellent flow through the site of the previously noted dissection without evidence of stenosis or of intraluminal filling defects. More distally, there continued to be occlusion of the right middle cerebral artery with slow permeation of contrast in the proximal right middle cerebral artery M1 segment. The delayed arterial phase continued to demonstrate retrograde opacification of the right MCA distribution M3 M4 and perisylvian branches. The FlowGate guide catheter was then retrieved into the abdominal aorta and with the neurovascular sheath, was placed over a 0.035 inch guidewire was for an 8 [place] Pinnacle sheath. This was then removed with successful closure with a 7 [place] Exo-Seal closure device. The right groin appeared soft with distal pulses being Dopplerable in the posterior tibial, in the dorsalis pedis arteries bilaterally unchanged. The patient was then extubated without difficulty. A CT scan of the brain revealed no evidence of a gross intracranial hemorrhage or mass effect or midline shift. There was a focal hyper density in the proximal sylvian fissure at site of the origin and proximal right MCA. It was felt this could represent a focal  area of contrast in the thrombus itself with a possible small amount of subarachnoid leakage. The patient was then extubated without difficulty. Upon recovery, the patient was able to obey simple commands like opening her eyes to name and move her right lower extremity to command. She was then transferred to neuro ICU for further post thrombectomy management. IMPRESSION: Status post endovascular revascularization of occluded right middle cerebral artery with 2 passes using the 5 mm x 33 mm Embotrap retrieval device achieving a TICI 2b  revascularization with delayed closure and angiographic evidence of slow revascularization from collaterals arising from the recurrent artery of Heubner, and the lateral lenticulostriate branches, and also the pericallosal and the callosal marginal branches of the right anterior cerebral artery. Focal small dissection in the right internal carotid artery petrous segment intact without intraluminal filling defects or stenosis. PLAN: Follow-up in the clinic approximately 6 weeks post discharge. Electronically Signed   By: Luanne Bras M.D.   On: 02/13/2018 11:14   Ct Head Code Stroke Wo Contrast  Result Date: 02/10/2018 CLINICAL DATA:  Code stroke. LEFT-sided weakness. Patient drooling in bed. Last seen normal 0930 hours EXAM: CT HEAD WITHOUT CONTRAST TECHNIQUE: Contiguous axial images were obtained from the base of the skull through the vertex without intravenous contrast. COMPARISON:  CT head 07/12/2017. MR head and MRA intracranial 07/13/2017. CT angiography and CT perfusion reported separately. FINDINGS: Brain: Well-demarcated areas of cortical hypodensity affecting the RIGHT temporal lobe, and RIGHT posterior frontal lobe, most consistent with subacute infarction. Ill-defined areas of low-attenuation in the RIGHT frontal cortex, RIGHT insula, and suspected RIGHT basal ganglia consistent with acute infarction. No hemorrhage, mass lesion, hydrocephalus, or extra-axial fluid. Chronic LEFT hemisphere infarcts. Vascular: Hyperdense M1 MCA Skull: Normal. Negative for fracture or focal lesion. Sinuses/Orbits: No acute finding. Other: None ASPECTS ([place] Stroke Program Early CT Score) - Ganglionic level infarction (caudate, lentiform nuclei, internal capsule, insula, M1-M3 cortex): 4 - Supraganglionic infarction (M4-M6 cortex): 3 Total score (0-10 with 10 being normal): 7 IMPRESSION: 1. Mixed pattern of both acute and suspected subacute RIGHT hemisphere ischemia, in the setting of hyperdense RIGHT M1 MCA and  acute LEFT-sided weakness, consistent with emergent large vessel occlusion of the RIGHT proximal MCA. No hemorrhage. 2. ASPECTS is 7. These results were called by telephone at the time of interpretation on 02/10/2018 at 11:24 am to Dr. Francine Graven , who verbally acknowledged these results. Electronically Signed   By: Staci Righter M.D.   On: 02/10/2018 11:25   Vas [place] Lower Extremity Venous (dvt)  Result Date: 02/12/2018  Lower Venous Study Indications: Embolic stroke.  Limitations: Body habitus, bandages and immobility. Performing Technologist: Lorina Rabon  Examination Guidelines: A complete evaluation includes B-mode imaging, spectral Doppler, color Doppler, and power Doppler as needed of all accessible portions of each vessel. Bilateral testing is considered an integral part of a complete examination. Limited examinations for reoccurring indications may be performed as noted.  Right Venous Findings: +---------+---------------+---------+-----------+----------+-------+          CompressibilityPhasicitySpontaneityPropertiesSummary +---------+---------------+---------+-----------+----------+-------+ CFV      Full           Yes      Yes                          +---------+---------------+---------+-----------+----------+-------+ SFJ      Full                                                 +---------+---------------+---------+-----------+----------+-------+  FV Prox  Full                                                 +---------+---------------+---------+-----------+----------+-------+ FV Mid   Full                                                 +---------+---------------+---------+-----------+----------+-------+ FV DistalFull                                                 +---------+---------------+---------+-----------+----------+-------+ PFV      Full                                                  +---------+---------------+---------+-----------+----------+-------+ POP      Full           Yes      Yes                          +---------+---------------+---------+-----------+----------+-------+ PTV      Full                                                 +---------+---------------+---------+-----------+----------+-------+ PERO     Full                                                 +---------+---------------+---------+-----------+----------+-------+ A cystic structure with non-vascular feature seen at right popliteal fossa medial, measuring 2.54x3.24x0.85cm.  Left Venous Findings: +---------+---------------+---------+-----------+----------+-------+          CompressibilityPhasicitySpontaneityPropertiesSummary +---------+---------------+---------+-----------+----------+-------+ CFV      Full           Yes      Yes                          +---------+---------------+---------+-----------+----------+-------+ SFJ      Full                                                 +---------+---------------+---------+-----------+----------+-------+ FV Prox  Full                                                 +---------+---------------+---------+-----------+----------+-------+ FV Mid   Full                                                 +---------+---------------+---------+-----------+----------+-------+  FV DistalFull                                                 +---------+---------------+---------+-----------+----------+-------+ PFV      Full                                                 +---------+---------------+---------+-----------+----------+-------+ POP      Full           Yes      Yes                          +---------+---------------+---------+-----------+----------+-------+ PTV      Full                                                 +---------+---------------+---------+-----------+----------+-------+ PERO     Full                                                  +---------+---------------+---------+-----------+----------+-------+ A cystic structure with internal echoes, non-vascular feature seen at left popliteal fossa medial, measuring 1.36x2.09x0.98cm.    Summary: Right: There is no evidence of deep vein thrombosis in the lower extremity. Left: There is no evidence of deep vein thrombosis in the lower extremity.  *See table(s) above for measurements and observations. Electronically signed by Ruta Hinds MD on 02/12/2018 at 9:14:26 AM.    Final    Ir Angio Vertebral Sel Subclavian Innominate Uni R Mod Sed  Result Date: 02/15/2018 INDICATION: Right gaze deviation. Left-sided hemiplegia. CT angiogram head and neck revealing a right middle cerebral artery proximal occlusion.  EXAM: 1. EMERGENT LARGE VESSEL OCCLUSION THROMBOLYSIS (anterior CIRCULATION)  COMPARISON:  CT angiogram of the head and neck of 02/10/2018.  MEDICATIONS: Ancef 2 g IV antibiotic was administered within 1 hour of the procedure.  ANESTHESIA/SEDATION: General anesthesia  CONTRAST:  Isovue 300 approximately 115 mL  FLUOROSCOPY TIME:  Fluoroscopy Time: 115 minutes 42 seconds (5540 mGy).  COMPLICATIONS: None immediate.  TECHNIQUE: Following a full explanation of the procedure along with the potential associated complications, an informed witnessed consent was obtained from the patient is husband. The risks of intracranial hemorrhage of 10%, worsening neurological deficit, ventilator dependency, death and inability to revascularize were all reviewed in detail with the patient's husband.  The patient was then put under general anesthesia by the Department of Anesthesiology at [facility].  The right groin was prepped and draped in the usual sterile fashion. Thereafter using modified Seldinger technique, transfemoral access into the right common femoral artery was obtained without difficulty. Over a 0.035 inch guidewire a 5 French Pinnacle  sheath was inserted. Through this, and also over a 0.035 inch guidewire a 5 [place] JB 1 catheter was advanced to the aortic arch region and selectively positioned in the innominate artery and the right common carotid artery.  FINDINGS: The innominate artery injection demonstrates the origin of the right subclavian  artery and the right common carotid artery to be widely patent.  The right vertebral artery origin is patent. The vessel is seen to opacify to the cranial skull base. Normal opacification is seen of the right vertebrobasilar junction, the right posterior-inferior cerebellar artery and the proximal basilar artery on lateral projections.  The right common carotid arteriogram demonstrates the right external carotid artery and its major branches to be widely patent.  The right internal carotid artery at the bulb to the cranial skull base demonstrates wide patency.  The petrous segment demonstrates normal caliber.  There is a sharp U shaped cavernous carotid leading to the right supraclinoid ICA which demonstrates a moderate stenosis just proximal to the right ICA terminus.  The right middle cerebral artery demonstrates complete occlusion in the proximal M1 segment. A blush of contrast is noted emanating from the mid right M1 segment projecting into the regions of the basal ganglia.  The delayed arterial phase demonstrates partial reconstitution of the right MCA distal branches from leptomeningeal collaterals arising from the pericallosal and the callosal marginal branches of the right anterior cerebral artery.  The right anterior cerebral artery demonstrates normal opacification into the capillary and venous phases.  PROCEDURE: The diagnostic JB 1 catheter in the right common carotid artery was exchanged over a 0.035 inch 300 cm Rosen exchange guidewire for an 8 French 55 cm Brite tip neurovascular sheath. Good aspiration was obtained from the hub of the neurovascular sheath. This was then connected  to continuous heparinized saline infusion.  Over the [facility] exchange guidewire, an 85 cm 8 [place] FlowGate balloon guide catheter which had been prepped with 50% contrast and 50% heparinized saline infusion was advanced and positioned just proximal to the right common carotid bifurcation. The guidewire was removed. Good aspiration obtained from the hub of the [facility] guide catheter. A gentle control arteriogram demonstrates patency of the vessel distally. No evidence of focal intraluminal filling defects or dissections or spasm was noted.  Over a 0.014 inch Softip Synchro micro guidewire, a combination of a 115 cm Navien guide catheter inside of which was an 021 Trevo ProVue microcatheter was advanced to the distal end of the [facility] guide catheter. The latter had been advanced to the mid cervical right ICA. With the micro guidewire leading with a J-tip configuration, the combination was navigated to the supraclinoid right ICA. Using a torque device, the micro guidewire was then gently manipulated meeting moderate resistance to the advancement of the micro guidewire. Eventual manipulation with a torque device allowed opacification into a superior division branch M2 M3 region followed by the microcatheter. The guidewire was removed. There was slow aspiration of contrast from the hub of the microcatheter. The microcatheter was then gently retrieved more proximally until free aspiration of blood was noted. This was then connected to continuous heparinized saline infusion.  A 5 mm x 33 mm Embotrap retrieval device was then advanced to the distal end of the microcatheter using biplane roadmap technique and constant fluoroscopic guidance in a coaxial manner and with constant heparinized saline infusion. The proximal and the distal landing zones were identified. The O ring on the delivery microcatheter was then loosened. With slight forward gentle traction with the right hand on the delivery micro guidewire, the  microcatheter was retrieved unsheathing the distal and then the proximal portion of the device. A control arteriogram performed through the Navien guide catheter in the supraclinoid right ICA demonstrated no significant visualization of the right middle cerebral artery branches.  Proximal flow  arrest was then initiated in the mid right ICA cervical region by inflating the balloon at the tip of the [facility] guide catheter. Thereafter, as constant aspiration was applied with a 60 mL syringe at the hub of the [facility] guide catheter, and with a vacuum pump at the hub of the Navien guide catheter, the combination of the retrieval device, the microcatheter and the Navien guide catheter were then retrieved and removed. Aspiration was continued into the hub of the [facility] guide catheter as the flow arrest was reversed in the right internal carotid artery. Free back bleed of blood was noted at the hub of the [facility] guide catheter. Gentle control arteriogram performed through the right internal carotid artery continued to demonstrate occlusion of the right middle cerebral artery.  A second pass was then made using the above combination and advanced to the supraclinoid right ICA.  The micro guidewire was then gently manipulated using a torque device and advanced through the occluded right middle cerebral and into the M2 M3 region of the inferior division.  Good aspiration was obtained from the hub of the microcatheter. Gentle contrast injection demonstrated safe position of tip of the microcatheter.  This was then connected to continuous heparinized saline infusion. A 5 mm x 33 mm Embotrap retrieval device was again advanced the distal end of the microcatheter. The proximal and the distal landing zones were then defined. The device was then deployed as mentioned above. A control arteriogram performed through the Navien guide catheter in the right internal carotid artery demonstrated a revascularization with diminutive  right MCA branches.  With proximal flow arrest in the right internal carotid artery, the combination of the retrieval device, the microcatheter and the Navien guide catheter were retrieved and removed as constant aspiration was applied with a 60 mL syringe at the hub of the [facility] guide catheter, and with a Penumbra suction vacuum pump at the hub of the 5 [place] Navien guide catheter. Proximal flow arrest was then reversed by deflating the balloon of the [facility] guide catheter in the right internal carotid artery. Free back bleed of blood was noted at the hub of the [facility] guide catheter. A gentle control arteriogram performed through the 8 [place] FlowGate guide catheter in the right internal carotid artery demonstrated minimal improved flow in the proximal right middle cerebral artery. Delayed arterial phase demonstrated slow opacification of the right middle cerebral artery branches in the trifurcation region.  Also noted was a blush of contrast emanating from the recurrent artery of Heubner from the proximal right anterior cerebral A1 segment, and also the anterior temporal branch and the numerous fine branches arising from the right middle cerebral M1 segment.  There were a few streaks of clot noted in the retrieval device.  At this time a control arteriogram also demonstrated a focal smooth outpouching in the petrous segment of the right internal carotid artery proximally.  Access distal to the dissection was eventually obtained over a micro guidewire using a Trevo ProVue 021 microcatheter. This was then connected to continuous heparinized saline infusion. Attempts were then made to place a stent across the flap which was nonobstructive.  However, significant tortuosity in the aortic arch, and also the right internal carotid artery proximally precluded with deployment of the device.  This was then retrieved and removed. Upon removal the flap was noted to have been tacked to the native vessel with  excellent flow passed the native vessel. An arteriogram performed through [facility] guide catheter in the right internal carotid  artery demonstrated brisk flow into the right internal carotid artery cranial skull base and the right internal carotid artery supraclinoid segment. Free flow was noted in the right anterior cerebral artery. Also demonstrated at this time was flow through the previously occluded right middle cerebral artery with filling defect proximally, leading to a tapered severe occlusion associated with a blush of contrast. The delayed arterial phase demonstrated progressive retrograde opacification of the peri-insular branches. Given the above an angio findings, and the retrograde opacification of the distal right middle cerebral artery M3 M4 branches, it was decided to stop.  A final control arteriogram performed through the [facility] guide catheter in the right internal carotid artery demonstrated excellent flow through the site of the previously noted dissection without evidence of stenosis or of intraluminal filling defects. More distally, there continued to be occlusion of the right middle cerebral artery with slow permeation of contrast in the proximal right middle cerebral artery M1 segment. The delayed arterial phase continued to demonstrate retrograde opacification of the right MCA distribution M3 M4 and perisylvian branches.  The FlowGate guide catheter was then retrieved into the abdominal aorta and with the neurovascular sheath, was placed over a 0.035 inch guidewire was for an 8 [place] Pinnacle sheath. This was then removed with successful closure with a 7 [place] Exo-Seal closure device. The right groin appeared soft with distal pulses being Dopplerable in the posterior tibial, in the dorsalis pedis arteries bilaterally unchanged.  The patient was then extubated without difficulty. A CT scan of the brain revealed no evidence of a gross intracranial hemorrhage or mass effect or midline  shift. There was a focal hyper density in the proximal sylvian fissure at site of the origin and proximal right MCA. It was felt this could represent a focal area of contrast in the thrombus itself with a possible small amount of subarachnoid  leakage.  The patient was then extubated without difficulty. Upon recovery, the patient was able to obey simple commands like opening her eyes to name and move her right lower extremity to command.  She was then transferred to neuro ICU for further post thrombectomy management.  IMPRESSION: Status post endovascular revascularization of occluded right middle cerebral artery with 2 passes using the 5 mm x 33 mm Embotrap retrieval device achieving a TICI 2b revascularization with delayed closure and angiographic evidence of slow revascularization from collaterals arising from the recurrent artery of Heubner, and the lateral lenticulostriate branches, and also the pericallosal and the callosal marginal branches of the right anterior cerebral artery.  Focal small dissection in the right internal carotid artery petrous segment intact without intraluminal filling defects or stenosis.  PLAN: Follow-up in the clinic approximately 6 weeks post discharge.   Electronically Signed   By: Luanne Bras M.D.   On: 02/13/2018 11:14      HISTORY OF PRESENT ILLNESS Brandy [name] is an 77 y.o. female  With PMH significant for TIA, HTN, CHF, stroke ( April 2019), and tobacco abuse who presented to [facility] with c/o of left sided weakness and gaze to the right. Transferred to [facility] for thrombectomy.  Per husband patient was lying in the bed and she was fine. When he checked on her an hour later she was found to be drooling with left arm and leg weakness. With a gaze to the right. Patient does take a baby aspirin daily.   DISCHARGE EXAM Blood pressure (!) 125/58, pulse 77, temperature 98.5 F (36.9 C), temperature source Oral, resp. rate 15,  height 5\' 4"  (1.626 m),  weight 68.5 kg, SpO2 99 %.  PHYSICAL EXAM  General - Well nourished, well developed, drowsy, easily arouses to voice or light touch, not in distress.   Cardiovascular - Regular rate and rhythm. Lungs: diminshed breath sounds in left lobe. Right lobe CTA.  Neuro - awake, alert, with eyes open, right gaze preference but able to cross midline.Paucity of speech, no aphasia, able to name 2/2 and repeat but moderate dysarthria. PERRL, full field full but with left simultagnosia. Left facial droop and tongue protrusion midline. Left UE 0/5 and left LE trace withdraw with pain. RUE  4/5 and RLE 3/5. Left babinski positive. Sensation symmetrical, coordination not cooperative. Gait not tested.   Discharge Diet   Diet Order            DIET - DYS 1 Room service appropriate? Yes; Fluid consistency: Thin  Diet effective now             liquids  DISCHARGE PLAN  Disposition:  Transfer to [place] for ongoing PT, OT and ST  aspirin 325 mg daily and clopidogrel 75 mg daily for secondary stroke prevention.  Recommend ongoing risk factor control by Primary Care Physician at time of discharge from inpatient rehabilitation.  Follow-up [name], MD in 2 weeks following discharge from rehab.  Follow-up in [facility] in 4 weeks following discharge from rehab, office to schedule an appointment.   35 minutes were spent preparing discharge.  Laurey Morale, MSN, NP-C [facility] [phone]  ATTENDING NOTE: I reviewed above note and agree with the assessment and plan. Pt was seen and examined.   Patient husband and niece at bedside.  Patient lying in bed, lethargic but easily arousable.  No acute neurological deficit still has right gaze preference but able to cross midline, as well as left hemiplegia.  Had again low-grade fever 100.4 overnight but so far no source of infection.  UA negative and chest x-ray showed stable left  basilar opacity.  Labs stable creatinine 1.04 at baseline.  Hemoglobin dropped from 8.5 to 8.1, will check stool occult blood, anemia work-up shows normal Z36 and folic acid as well as ferritin level but low iron level.  Patient ready for CIR transfer from neurological standpoint.  Discussed with Dr. Letta Pate in the hallway.  Continue aspirin 325 and Plavix 75 for 3 months given intracranial stenosis.  Continue low-dose Lipitor.  BP goal 130-150 given large vessel occlusion.  She will need 30-day cardio event monitoring as outpatient to rule out A. fib.  Patient will follow up with [place] stroke neurology clinic in 4 weeks.  Rosalin Hawking, MD PhD Stroke Neurology 02/16/2018 5:37 PM

## 2018-02-17 ENCOUNTER — Inpatient Hospital Stay (HOSPITAL_COMMUNITY): Payer: Medicare Other | Admitting: Physical Therapy

## 2018-02-17 ENCOUNTER — Inpatient Hospital Stay (HOSPITAL_COMMUNITY): Payer: Medicare Other | Admitting: Occupational Therapy

## 2018-02-17 ENCOUNTER — Inpatient Hospital Stay (HOSPITAL_COMMUNITY): Payer: Medicare Other | Admitting: Speech Pathology

## 2018-02-17 LAB — CBC WITH DIFFERENTIAL/PLATELET
Abs Immature Granulocytes: 0.04 10*3/uL (ref 0.00–0.07)
BASOS ABS: 0 10*3/uL (ref 0.0–0.1)
BASOS PCT: 0 %
Eosinophils Absolute: 0.2 10*3/uL (ref 0.0–0.5)
Eosinophils Relative: 2 %
HCT: 26.3 % — ABNORMAL LOW (ref 36.0–46.0)
Hemoglobin: 8.2 g/dL — ABNORMAL LOW (ref 12.0–15.0)
IMMATURE GRANULOCYTES: 0 %
LYMPHS ABS: 0.7 10*3/uL (ref 0.7–4.0)
Lymphocytes Relative: 8 %
MCH: 28.5 pg (ref 26.0–34.0)
MCHC: 31.2 g/dL (ref 30.0–36.0)
MCV: 91.3 fL (ref 80.0–100.0)
MONOS PCT: 11 %
Monocytes Absolute: 1 10*3/uL (ref 0.1–1.0)
NEUTROS ABS: 7.3 10*3/uL (ref 1.7–7.7)
NEUTROS PCT: 79 %
PLATELETS: 173 10*3/uL (ref 150–400)
RBC: 2.88 MIL/uL — ABNORMAL LOW (ref 3.87–5.11)
RDW: 14.5 % (ref 11.5–15.5)
WBC: 9.3 10*3/uL (ref 4.0–10.5)
nRBC: 0 % (ref 0.0–0.2)

## 2018-02-17 LAB — COMPREHENSIVE METABOLIC PANEL
ALBUMIN: 2.3 g/dL — AB (ref 3.5–5.0)
ALT: 13 U/L (ref 0–44)
AST: 21 U/L (ref 15–41)
Alkaline Phosphatase: 36 U/L — ABNORMAL LOW (ref 38–126)
Anion gap: 7 (ref 5–15)
BUN: 13 mg/dL (ref 8–23)
CHLORIDE: 115 mmol/L — AB (ref 98–111)
CO2: 21 mmol/L — ABNORMAL LOW (ref 22–32)
CREATININE: 1.1 mg/dL — AB (ref 0.44–1.00)
Calcium: 8.7 mg/dL — ABNORMAL LOW (ref 8.9–10.3)
GFR calc Af Amer: 55 mL/min — ABNORMAL LOW (ref >60)
GFR, EST NON AFRICAN AMERICAN: 48 mL/min — AB (ref >60)
GLUCOSE: 84 mg/dL (ref 70–99)
Potassium: 4 mmol/L (ref 3.5–5.1)
Sodium: 143 mmol/L (ref 135–145)
Total Bilirubin: 0.5 mg/dL (ref 0.3–1.2)
Total Protein: 4.9 g/dL — ABNORMAL LOW (ref 6.5–8.1)

## 2018-02-17 MED ORDER — METHYLPHENIDATE HCL 5 MG PO TABS
5.0000 mg | ORAL_TABLET | Freq: Two times a day (BID) | ORAL | Status: DC
  Administered 2018-02-17 – 2018-03-16 (×56): 5 mg via ORAL
  Filled 2018-02-17 (×56): qty 1

## 2018-02-17 MED ORDER — SODIUM CHLORIDE 0.45 % IV SOLN
INTRAVENOUS | Status: DC
  Administered 2018-02-17 – 2018-02-22 (×8): via INTRAVENOUS

## 2018-02-17 NOTE — IPOC Note (Signed)
Overall Plan of Care [facility]) Patient Details Name: Brandy [name] MRN: [id number] DOB: 22-Apr-1940  Admitting Diagnosis: <principal problem not specified>  Hospital Problems: Active Problems:   Acute right MCA stroke (HCC)   Stage 3 chronic kidney disease (HCC)   Hemiparesis affecting left side as late effect of stroke (HCC)   Left-sided neglect   Right middle cerebral artery stroke [facility])     Functional Problem List: Nursing Behavior, Bladder, Bowel, Medication Management, Nutrition, Safety, Skin Integrity  PT Balance, Behavior, Edema, Endurance, Motor, Nutrition, Pain, Perception, Safety, Sensory, Skin Integrity  OT Balance, Cognition, Edema, Endurance, Motor, Sensory, Safety, Perception, Pain, Vision  SLP Cognition, Linguistic, Nutrition  TR         Basic ADL's: OT Eating, Grooming, Bathing, Dressing, Toileting     Advanced  ADL's: OT       Transfers: PT Bed Mobility, Bed to Chair, Furniture, Floor, Teacher, early years/pre, Metallurgist: PT Ambulation, Emergency planning/management officer, Stairs     Additional Impairments: OT Fuctional Use of Upper Extremity  SLP Swallowing, Communication, Social Cognition expression Problem Solving, Memory, Attention, Awareness  TR      Anticipated Outcomes Item Anticipated Outcome  Self Feeding supervision  Swallowing  supervision    Basic self-care  mod to max assist  Toileting  max assist    Bathroom Transfers mod to max assist  Bowel/Bladder  Cont B/B, LBM 02/16/18  Transfers  Mod Assist with LRAD   Locomotion  WC level at min-mod assist   Communication  supervision   Cognition  min assist   Pain  Denies pain; tolerable 4/10 "sometimes my right knee gives me problems."  Safety/Judgment  Call light within reach, bed/chair alarm, proper footwear. Refrain from falls/injuries    Therapy Plan: PT Intensity: Minimum of 1-2 x/day ,45 to 90 minutes PT Frequency: 5 out of 7 days PT Duration Estimated Length of Stay: 3-4 weeks   OT Intensity: Minimum of 1-2 x/day, 45 to 90 minutes OT Frequency: 5 out of 7 days OT Duration/Estimated Length of Stay: 26-28 days SLP Intensity: Minumum of 1-2 x/day, 30 to 90 minutes SLP Frequency: 3 to 5 out of 7 days SLP Duration/Estimated Length of Stay: 21-28 days     Team Interventions: Nursing Interventions Patient/Family Education, Bladder Management, Bowel Management, Medication Management, Skin Care/Wound Management  PT interventions Ambulation/gait training, Community reintegration, Training and development officer, Cognitive remediation/compensation, Discharge planning, Disease management/prevention, DME/adaptive equipment instruction, Functional electrical stimulation, Neuromuscular re-education, Functional mobility training, Pain management, Patient/family education, Psychosocial support, Skin care/wound management, Splinting/orthotics, Stair training, Therapeutic Activities, Therapeutic Exercise, UE/LE Strength taining/ROM, UE/LE Coordination activities, Visual/perceptual remediation/compensation, Wheelchair propulsion/positioning  OT Interventions Training and development officer, Community reintegration, Disease mangement/prevention, Technical sales engineer stimulation, Neuromuscular re-education, Patient/family education, Self Care/advanced ADL retraining, Splinting/orthotics, Therapeutic Exercise, UE/LE Coordination activities, UE/LE Strength taining/ROM, Therapeutic Activities, Psychosocial support, Pain management, Functional mobility training, DME/adaptive equipment instruction, Discharge planning, Cognitive remediation/compensation, Visual/perceptual remediation/compensation  SLP Interventions Cognitive remediation/compensation, Cueing hierarchy, Dysphagia/aspiration precaution training, Internal/external aids, Environmental controls, Patient/family education, Functional tasks  TR Interventions    SW/CM Interventions Discharge Planning, Psychosocial Support, Patient/Family Education    Barriers to Discharge MD  Medical stability  Nursing      PT Inaccessible home environment, Decreased caregiver support, Home environment Child psychotherapist, Insurance underwriter for SNF coverage    OT      SLP      SW       Team Discharge Planning: Destination: PT-Home ,OT- Home , SLP-Home Projected Follow-up: PT- , OT-  24 hour supervision/assistance, SLP-Home Health SLP, Outpatient SLP, Skilled Nursing facility, 24 hour supervision/assistance Projected Equipment Needs: PT-Wheelchair (measurements), Wheelchair cushion (measurements), Rolling walker with 5" wheels, OT- To be determined, SLP-None recommended by SLP Equipment Details: PT- , OT-  Patient/family involved in discharge planning: PT- Patient,  OT-Patient, Family member/caregiver, SLP-Patient, Family member/caregiver  MD ELOS: 21-24d Medical Rehab Prognosis:  Good Assessment:  77 year old right-handed female with history of cardiomyopathy, chronic combined systolic and diastolic congestive heart failure, hypertension,COPD/ tobacco abuse, CVA 06/2017 to [facility] maintained on aspirin 162 mg daily. Per chart review patient lives with spouse. 2 level home with one step to entry. Reportedly independent prior to admission she still drives and prepares meals. She does have a housekeeper. [facility] with left-sided weakness and right gaze preference. Cranial CT scan showed mixed pattern of both acute suspected subacute right hemisphere ischemia in the setting of hyperdense right M1 MCA and acute left side weakness consistent with emergent large vessel occlusion on the right proximal MCA. CT angiogram head and neck acute right M1 MCA occlusion. Patient was transferred to [facility] underwent mechanical thrombectomy minimal recannulization of occluded right MCA M1 segment per interventional radiology.Follow-upMRI the brain showed large area infarction right MCA territory as well as petechial hemorrhage  within the right basal ganglia. Echocardiogram with ejection fraction of 40% diffuse hypokinesis. TEE completed 02/13/2018 with ejection fraction of 40% could not rule out early forming thrombus in the L AA versus artifact from posterior wall of the appendage. Multiple runs of nonsustained A. Tach and PACs were noted during procedure. Recommendations were for 30 day cardiac event monitor on discharge. Patient is maintained on aspirin and Plavix for CVA prophylaxis. Subcutaneous Lovenox for DVT prophylaxis. Dysphagia #1 thin liquid diet.Acute on chronic anemia 8.5 as well as platelets 120,000 and closely monitored. Therapy evaluations completed with recommendations of physical medicine rehabilitation consult   See Team Conference Notes for weekly updates to the plan of care  Now requiring 24/7 Rehab RN,MD, as well as CIR level PT, OT and SLP.  Treatment team will focus on ADLs and mobility with goals set at [facility]

## 2018-02-17 NOTE — Progress Notes (Signed)
Orthopedic Tech Progress Note Patient Details:  Brandy [name] 04-29-1940 [id number]  Patient ID: Brandy [name], female   DOB: [birth year], 77 y.o.   MRN: [id number]   Brandy [name] 02/17/2018, 1:55 PMCalled [facility] for left Prafo boot and left resting hand splint.

## 2018-02-17 NOTE — Progress Notes (Signed)
Pt is alert and oriented. Denies any pain or discomfort. Right facial droop noted. Skin warm and dry. Education on plan of care provided, Patient agreed with the plan and verbalized understanding. Call Sandy within reach. Spouse and son at bedside.

## 2018-02-17 NOTE — Evaluation (Addendum)
Speech Language Pathology Assessment and Plan  Patient Details  Name: Brandy [name] MRN: [id number] Date of Birth: [birth year]  SLP Diagnosis: Dysarthria;Dysphagia;Cognitive Impairments  Rehab Potential: Fair ELOS: 21-28 days     Today's Date: 02/17/2018 SLP Individual Time: 0807-0908 SLP Individual Time Calculation (min): 61 min   Problem List:  Patient Active Problem List   Diagnosis Date Noted  . Hemiparesis affecting left side as late effect of stroke ([place]) 02/16/2018  . Left-sided neglect 02/16/2018  . Right middle cerebral artery stroke ([place]) 02/16/2018  . Acute embolic stroke ([place])   . Acute right MCA stroke ([place])   . History of CVA with residual deficit   . Benign essential HTN   . Chronic combined systolic and diastolic congestive heart failure ([place])   . History of fusion of cervical spine   . Stage 3 chronic kidney disease ([place])   . Acute blood loss anemia   . Stroke ([place]) 02/10/2018  . Middle cerebral artery embolism, right 02/10/2018  . Health care maintenance 11/01/2017  . Non-ischemic cardiomyopathy ([place]) 09/14/2017  . Precordial chest pain 09/14/2017  . B12 deficiency 09/13/2017  . Tachycardia 09/13/2017  . Acute ischemic stroke ([place])   . Seasonal allergies 07/13/2017  . TIA (transient ischemic attack) 07/12/2017  . Anemia 07/12/2017  . Pneumonia 06/02/2017  . Pleural effusion 06/02/2017  . Acute on chronic systolic congestive heart failure ([place])   . Elevated troponin   . Other fatigue 05/04/2017  . Dyspnea 05/04/2017  . Pseudophakia of both eyes 08/06/2016  . Chronic combined systolic and diastolic CHF (congestive heart failure) ([place]) 07/09/2016  . Hypertensive heart disease   . Hypertension   . Nuclear sclerosis, bilateral 05/24/2016  . HTN (hypertension) 01/22/2013  . TOBACCO ABUSE 02/18/2008  . CARDIOMYOPATHY 02/18/2008  . Diverticulosis of colon 02/18/2008  . OSTEOPENIA 02/18/2008  . Disorder of bone and cartilage 02/18/2008   Past  Medical History:  Past Medical History:  Diagnosis Date  . Cardiomyopathy    a. EF of 25% in 07/2006 b. EF normalized by repeat echo in 2011 c. EF 35-40% by echo in 05/2017 with cath showing mild nonobstructive CAD  . Chronic combined systolic and diastolic CHF (congestive heart failure) ([place])   . Colon polyps   . Diverticulosis of colon   . Hypertension   . Hypertensive heart disease   . Osteopenia   . TIA (transient ischemic attack)   . Tobacco abuse    50 pack year   Past Surgical History:  Past Surgical History:  Procedure Laterality Date  . BREAST BIOPSY    . CARPAL TUNNEL RELEASE     right hand  . CERVICAL FUSION    . CESAREAN SECTION     2 times  . COLONOSCOPY  6294   pt uncertain as to whether polypectomy required or performed  . ECTOPIC PREGNANCY SURGERY    . IR ANGIO VERTEBRAL SEL SUBCLAVIAN INNOMINATE UNI R MOD SED  02/10/2018  . IR CT HEAD LTD  02/10/2018  . IR PERCUTANEOUS ART THROMBECTOMY/INFUSION INTRACRANIAL INC DIAG ANGIO  02/10/2018  . RADIOLOGY WITH ANESTHESIA N/A 02/10/2018   Procedure: RADIOLOGY WITH ANESTHESIA;  Surgeon: Luanne Bras, MD;  Location: [facility];  Service: Radiology;  Laterality: N/A;  . RIGHT/LEFT HEART CATH AND CORONARY ANGIOGRAPHY N/A 06/03/2017   Procedure: RIGHT/LEFT HEART CATH AND CORONARY ANGIOGRAPHY;  Surgeon: Jettie Booze, MD;  Location: [place] CV LAB;  Service: Cardiovascular;  Laterality: N/A;  . TEE WITHOUT CARDIOVERSION N/A 02/13/2018  Procedure: TRANSESOPHAGEAL ECHOCARDIOGRAM (TEE);  Surgeon: Sueanne Margarita, MD;  Location: [facility] ENDOSCOPY;  Service: Cardiovascular;  Laterality: N/A;  . TONSILLECTOMY      Assessment / Plan / Recommendation Clinical Impression   Brandy [name] is a 77 year old right-handed female with history of cardiomyopathy, chronic combined systolic and diastolic congestive heart failure, hypertension,COPD/ tobacco abuse, CVA 06/2017 to [facility] maintained on aspirin 162 mg daily. Per  chart review patient lives with spouse. 2 level home with one step to entry. Reportedly independent prior to admission she still drives and prepares meals. She does have a housekeeper. [facility] with left-sided weakness and right gaze preference. Cranial CT scan showed mixed pattern of both acute suspected subacute right hemisphere ischemia in the setting of hyperdense right M1 MCA and acute left side weakness consistent with emergent large vessel occlusion on the right proximal MCA. CT angiogram head and neck acute right M1 MCA occlusion. Patient was transferred to [facility] underwent mechanical thrombectomy minimal recannulization of occluded right MCA M1 segment per interventional radiology.Follow-upMRI the brain showed large area infarction right MCA territory as well as petechial hemorrhage within the right basal ganglia. Echocardiogram with ejection fraction of 40% diffuse hypokinesis. TEE completed 02/13/2018 with ejection fraction of 40% could not rule out early forming thrombus in the L AA versus artifact from posterior wall of the appendage. Multiple runs of nonsustained A. Tach and PACs were noted during procedure. Recommendations were for 30 day cardiac event monitor on discharge. Patient is maintained on aspirin and Plavix for CVA prophylaxis. Subcutaneous Lovenox for DVT prophylaxis. Dysphagia #1 thin liquid diet.Acute on chronic anemia 8.5 as well as platelets 120,000 and closely monitored. Therapy evaluations completed with recommendations of physical medicine rehabilitation consult. Patient was admitted for a comprehensive rehabilitation program.  SLP evaluation was completed on 02/17/2018 with the following results:  Pt presents with a moderately severe oropharyngeal dysphagia that is both motor, sensory, and cognitive in nature.  Pt has decreased attention to boluses and significant left sided oral motor weakness which impacts her ability to compensate for  deficits in bolus containment, transit, and clearance from the oral cavity.  As a result, pt need max to total assist for use of swallowing precautions and her PO intake has thusfar been minimal.  I feel that her safest option for PO intake and the best chance she has for maintaining hydration and nutrition is with total feeding assistance from staff members only at this time.  I also recommend using the Provale cup with liquids to help with portion control as it appeared to help with bolus containment.  Discussed recommendations with pt's husband who verbalized understanding.  I also provided skilled education to pt's husband on suctioning residuals from pt's mouth following meals.   As mentioned above, pt  has significant cognitive deficits characterized by decreased sustained attention to tasks, left inattention with right gaze preference, decreased task initiation, decreased functional problem solving, decreased awareness of her deficits, decreased safety awareness, slowed processing, and decreased alertness.  [name] husband states that pt was quite sedentary prior to admission and would often sleep until noon most days and take a nap within 1-2 hours after waking up.  Pt may benefit from having therapies begin later in the day.  Currently, pt requires max to total assist to complete basic tasks in addition to near constant stimulation to maintain alertness.  Discussed strategies for maximizing pt's alertness during the day with pt's husband.   Additionally, pt  presents with a moderately severe dysarthria resulting from left sided oral motor weakness and compounded by decreased awareness of her deficits.  As a result, pt has imprecise articulation of consonants, rapid rushes of speech, and low vocal intensity.  Currently she requires max assist to achieve intelligibility at the word level.   Pt would benefit from skilled ST while inpatient in order to maximize functional independence and reduce burden of care  prior to discharge.  Anticipate that pt will need 24/7 supervision at discharge in addition to [place] follow up at next level of care.    Skilled Therapeutic Interventions          Cognitive-linguistic and bedside swallow evaluation completed with results and recommendations reviewed with patient and family.     SLP Assessment  Patient will need skilled Speech Lanaguage Pathology Services during CIR admission    Recommendations  SLP Diet Recommendations: Dysphagia 1 (Puree);Thin Liquid Administration via: Cup;No straw Medication Administration: Crushed with puree Supervision: Staff to assist with self feeding Compensations: Slow rate;Small sips/bites Postural Changes and/or Swallow Maneuvers: Seated upright 90 degrees Oral Care Recommendations: Oral care BID Patient destination: Home Follow up Recommendations: Home Health SLP;Outpatient SLP;Skilled Nursing facility;24 hour supervision/assistance Equipment Recommended: None recommended by SLP    SLP Frequency 3 to 5 out of 7 days   SLP Duration  SLP Intensity  SLP Treatment/Interventions 21-28 days   Minumum of 1-2 x/day, 30 to 90 minutes  Cognitive remediation/compensation;Cueing hierarchy;Dysphagia/aspiration precaution training;Internal/external aids;Environmental controls;Patient/family education;Functional tasks    Pain Pain Assessment Pain Scale: 0-10 Pain Score: 0-No pain  Prior Functioning Cognitive/Linguistic Baseline: Within functional limits Type of Home: House  Lives With: Spouse Available Help at Discharge: Family;Available 24 hours/day Vocation: Retired  Industrial/product designer Term Goals: Week 1: SLP Short Term Goal 1 (Week 1): Pt will consume dys 1 textures and thin liquids with mod cues for use of swallowing precautions and minimal overt s/s of aspiration.   SLP Short Term Goal 2 (Week 1): Pt will sustain her attention to basic, familiar tasks for 5 minutes with mod cues for redirection.   SLP Short Term Goal 3 (Week 1): Pt  will locate items to the left of midline during functional tasks in >50% of opportunities with mod assist.   SLP Short Term Goal 4 (Week 1): Pt will complete basic, familiar tasks with max cues for functional problem solving.   SLP Short Term Goal 5 (Week 1): Pt will recall basic, daily information with mod assist verbal cues.   SLP Short Term Goal 6 (Week 1): Pt will increase vocal intensity to achieve intelligibility at the word level with mod assist.    Refer to Care Plan for Long Term Goals  Recommendations for other services: None   Discharge Criteria: Patient will be discharged from SLP if patient refuses treatment 3 consecutive times without medical reason, if treatment goals not met, if there is a change in medical status, if patient makes no progress towards goals or if patient is discharged from hospital.  The above assessment, treatment plan, treatment alternatives and goals were discussed and mutually agreed upon: by patient and by family  Emilio Math 02/17/2018, 12:55 PM

## 2018-02-17 NOTE — Progress Notes (Signed)
Inpatient Rehabilitation Center Individual Statement of Services  Patient Name:  Brandy [name]  Date:  02/17/2018  Welcome to the [facility].  Our goal is to provide you with an individualized program based on your diagnosis and situation, designed to meet your specific needs.  With this comprehensive rehabilitation program, you will be expected to participate in at least 3 hours of rehabilitation therapies Monday-Friday, with modified therapy programming on the weekends.  Your rehabilitation program will include the following services:  Physical Therapy (PT), Occupational Therapy (OT), Speech Therapy (ST), 24 hour per day rehabilitation nursing, Neuropsychology, Case Management (Social Worker), Rehabilitation Medicine, Nutrition Services and Pharmacy Services  Weekly team conferences will be held on Wednesdays to discuss your progress.  Your Social Worker will talk with you frequently to get your input and to update you on team discussions.  Team conferences with you and your family in attendance may also be held.  Expected length of stay:  21 to 28 days  Overall anticipated outcome:  Moderate to maximum assistance  Depending on your progress and recovery, your program may change. Your Social Worker will coordinate services and will keep you informed of any changes. Your Social Worker's name and contact numbers are listed  below.  The following services may also be recommended but are not provided by the [place] will be made to provide these services after discharge if needed.  Arrangements include referral to agencies that provide these services.  Your insurance has been verified to be:  [employer] Your primary doctor is:  Dr. [name]  Pertinent information will be shared with your doctor and your insurance  company.  Social Worker:  Alfonse Alpers, LCSW  [phone] or [phone]  Information discussed with and copy given to patient by: Trey Sailors, 02/17/2018, 2:52 PM

## 2018-02-17 NOTE — Progress Notes (Signed)
[facility] PHYSICAL MEDICINE & REHABILITATION PROGRESS NOTE   Subjective/Complaints: Slept "on and off".  Pt was "night owl" at home per husband  No issues overnite  ROS- neg CP, SOB, N/V/D   Objective:   Dg Chest Port 1 View  Result Date: 02/15/2018 CLINICAL DATA:  Fever. EXAM: PORTABLE CHEST 1 VIEW COMPARISON:  Radiograph of February 14, 2018. FINDINGS: Stable cardiomegaly. No pneumothorax is noted. Right lung is clear. Stable left basilar opacity is noted concerning for atelectasis or infiltrate with possible associated pleural effusion. Bony thorax is unremarkable. IMPRESSION: Stable left basilar opacity as described above. Electronically Signed   By: Marijo Conception, M.D.   On: 02/15/2018 09:23   Recent Labs    02/16/18 0400 02/17/18 0511  WBC 8.9 9.3  HGB 8.1* 8.2*  HCT 26.3* 26.3*  PLT 139* 173   Recent Labs    02/16/18 0400 02/17/18 0511  NA 143 143  K 4.1 4.0  CL 116* 115*  CO2 23 21*  GLUCOSE 87 84  BUN 12 13  CREATININE 1.04* 1.10*  CALCIUM 8.4* 8.7*   No intake or output data in the 24 hours ending 02/17/18 1093   Physical Exam: Vital Signs Blood pressure 139/68, pulse 85, temperature 98.9 F (37.2 C), temperature source Oral, resp. rate 17, height 5\' 4"  (1.626 m), weight 85.7 kg, SpO2 100 %.  Vitalsreviewed. HENT:  Head:Normocephalic.  Eyes:EOMare normal.  Neck:Normal range of motion.Neck supple.No thyromegalypresent.  Cardiovascular:Normal rateand regular rhythm.  Respiratory:Effort normaland breath sounds normal. Norespiratory distress.  AT:FTDD.Bowel sounds are normal. She exhibitsno distension.  Neurological: She isalert. Patient is a bit lethargic but arousable.Dysarthric speech. Follows commands .Left neglect, severe  Skin: Skin iswarmand dry. Patient has severe left neglect.  She is able to track past midline toward the left however. She has no sensation to pinch in the left upper and left lower limb Motor  strength is 0/5 in left upper limb Motor strength trace hip and knee ext in left lower limb Right upper extremity is 4+ at the deltoid bicep tricep grip Right lower extremity is 4+ at the hip flexor knee extensor ankle dorsi flexor MSK - no pain with shoulder ROM, + pain with Left great toe flex and ext but no swelling or erythema  Assessment/Plan: 1. Functional deficits secondary to right MCA infarction with right M1 occlusion status post minimal recannulization after mechanical thrombectomy as well as history of left MCA infarction April 2019. which require 3+ hours per day of interdisciplinary therapy in a comprehensive inpatient rehab setting.  Physiatrist is providing close team supervision and 24 hour management of active medical problems listed below.  Physiatrist and rehab team continue to assess barriers to discharge/monitor patient progress toward functional and medical goals  Care Tool:  Bathing              Bathing assist       Upper Body Dressing/Undressing Upper body dressing        Upper body assist      Lower Body Dressing/Undressing Lower body dressing            Lower body assist       Toileting Toileting    Toileting assist       Transfers Chair/bed transfer  Transfers assist           Locomotion Ambulation   Ambulation assist              Walk 10 feet activity   Assist  Walk 50 feet activity   Assist           Walk 150 feet activity   Assist           Walk 10 feet on uneven surface  activity   Assist           Wheelchair     Assist               Wheelchair 50 feet with 2 turns activity    Assist            Wheelchair 150 feet activity     Assist          Medical Problem List and Plan: 1.Left side weaknesssecondary to right MCA infarction with right M1 occlusion status post minimal recannulization after mechanical thrombectomy as well as history  of left MCA infarction April 2019. Advise 30 day cardiac monitor on discharge Initial rehab evals today 2. DVT Prophylaxis/Anticoagulation: Subcutaneous Lovenox. 3. Pain Management:Tylenol as needed 4. Mood:Aricept 5 mg daily 5. Neuropsych: This patienthe iscapable of making decisions on herown behalf. 6. Skin/Wound Care:Routine skin checks 7. Fluids/Electrolytes/Nutrition:Routine ins and outs with follow-up chemistries 8. Cardiomyopathy/chronic combined diastolic congestive heart failure. Monitor for any signs of fluid overload. 9. Hypertension. Coreg 12.5 mg twice a day. Monitor with increased mobility Vitals:   02/16/18 2027 02/17/18 0450  BP: 130/81 139/68  Pulse: (!) 102 85  Resp: 18 17  Temp: 98.9 F (37.2 C)   SpO2: 93% 100%   10. COPD/tobacco abuse. Continue inhalers. Provide counseling 11. Hyperlipidemia. Lipitor 12. Acute on chronic anemia. Admission hgb 8.8  13.  Hx gout left great toe does not appear acutely inflammed - tylenol prn, may need foot orthosis if pt has pain with WB  LOS: 1 days A FACE TO FACE EVALUATION WAS PERFORMED  Charlett Blake 02/17/2018, 7:12 AM

## 2018-02-17 NOTE — Discharge Instructions (Signed)
Inpatient Rehab Discharge Instructions  ELLISHA [name] Discharge date and time: No discharge date for patient encounter.   Activities/Precautions/ Functional Status: Activity: activity as tolerated Diet: as directed Wound Care: none needed Functional status:  ___ No restrictions     ___ Walk up steps independently ___ 24/7 supervision/assistance   ___ Walk up steps with assistance ___ Intermittent supervision/assistance  ___ Bathe/dress independently ___ Walk with walker     _ STROKE/TIA DISCHARGE INSTRUCTIONS SMOKING Cigarette smoking nearly doubles your risk of having a stroke & is the single most alterable risk factor  If you smoke or have smoked in the last 12 months, you are advised to quit smoking for your health.  Most of the excess cardiovascular risk related to smoking disappears within a year of stopping.  Ask you doctor about anti-smoking medications  [place] Quit Line: 1-800-QUIT NOW  Free Smoking Cessation Classes [phone]  CHOLESTEROL Know your levels; limit fat & cholesterol in your diet  Lipid Panel     Component Value Date/Time   CHOL 93 02/11/2018 0447   TRIG 42 02/11/2018 0447   HDL 33 (L) 02/11/2018 0447   CHOLHDL 2.8 02/11/2018 0447   VLDL 8 02/11/2018 0447   LDLCALC 52 02/11/2018 0447      Many patients benefit from treatment even if their cholesterol is at goal.  Goal: Total Cholesterol (CHOL) less than 160  Goal:  Triglycerides (TRIG) less than 150  Goal:  HDL greater than 40  Goal:  LDL (LDLCALC) less than 100   BLOOD PRESSURE American Stroke Association blood pressure target is less that 120/80 mm/Hg  Your discharge blood pressure is:  BP: 139/68  Monitor your blood pressure  Limit your salt and alcohol intake  Many individuals will require more than one medication for high blood pressure  DIABETES (A1c is a blood sugar average for last 3 months) Goal HGBA1c is under 7% (HBGA1c is blood sugar average for last 3 months)  Diabetes: No  known diagnosis of diabetes    Lab Results  Component Value Date   HGBA1C 4.7 (L) 02/11/2018     Your HGBA1c can be lowered with medications, healthy diet, and exercise.  Check your blood sugar as directed by your physician  Call your physician if you experience unexplained or low blood sugars.  PHYSICAL ACTIVITY/REHABILITATION Goal is 30 minutes at least 4 days per week  Activity: Increase activity slowly, Therapies: Physical Therapy: Home Health Return to work:   Activity decreases your risk of heart attack and stroke and makes your heart stronger.  It helps control your weight and blood pressure; helps you relax and can improve your mood.  Participate in a regular exercise program.  Talk with your doctor about the best form of exercise for you (dancing, walking, swimming, cycling).  DIET/WEIGHT Goal is to maintain a healthy weight  Your discharge diet is:  Diet Order            DIET - DYS 1 Room service appropriate? Yes; Fluid consistency: Thin  Diet effective now              liquids Your height is:  Height: 5\' 4"  (162.6 cm) Your current weight is: Weight: 85.7 kg Your Body Mass Index (BMI) is:  BMI (Calculated): 32.41  Following the type of diet specifically designed for you will help prevent another stroke.  Your goal weight range is:    Your goal Body Mass Index (BMI) is 19-24.  Healthy food habits can  help reduce 3 risk factors for stroke:  High cholesterol, hypertension, and excess weight.  RESOURCES Stroke/Support Group:  Call [phone]   STROKE EDUCATION PROVIDED/REVIEWED AND GIVEN TO PATIENT Stroke warning signs and symptoms How to activate emergency medical system (call 911). Medications prescribed at discharge. Need for follow-up after discharge. Personal risk factors for stroke. Pneumonia vaccine given:  Flu vaccine given:  My questions have been answered, the writing is legible, and I understand these instructions.  I will adhere to these goals &  educational materials that have been provided to me after my discharge from the hospital.   __ Bathe/dress with assistance ___ Walk Independently    ___ Shower independently ___ Walk with assistance    ___ Shower with assistance ___ No alcohol     ___ Return to work/school ________  Special Instructions:    My questions have been answered and I understand these instructions. I will adhere to these goals and the provided educational materials after my discharge from the hospital.  Patient/Caregiver Signature _______________________________ Date __________  Clinician Signature _______________________________________ Date __________  Please bring this form and your medication list with you to all your follow-up doctor's appointments.

## 2018-02-17 NOTE — Evaluation (Signed)
Occupational Therapy Assessment and Plan  Patient Details  Name: Brandy [name] MRN: [id number] Date of Birth: 17-Sep-1940  OT Diagnosis: abnormal posture, altered mental status, cognitive deficits, disturbance of vision, flaccid hemiplegia and hemiparesis and muscle weakness (generalized) Rehab Potential: Rehab Potential (ACUTE ONLY): Good ELOS: 26-28 days   Today's Date: 02/17/2018 OT Individual Time: 1101-1200 OT Individual Time Calculation (min): 59 min     Problem List:  Patient Active Problem List   Diagnosis Date Noted  . Hemiparesis affecting left side as late effect of stroke ([place]) 02/16/2018  . Left-sided neglect 02/16/2018  . Right middle cerebral artery stroke ([place]) 02/16/2018  . Acute embolic stroke ([place])   . Acute right MCA stroke ([place])   . History of CVA with residual deficit   . Benign essential HTN   . Chronic combined systolic and diastolic congestive heart failure ([place])   . History of fusion of cervical spine   . Stage 3 chronic kidney disease ([place])   . Acute blood loss anemia   . Stroke ([place]) 02/10/2018  . Middle cerebral artery embolism, right 02/10/2018  . Health care maintenance 11/01/2017  . Non-ischemic cardiomyopathy ([place]) 09/14/2017  . Precordial chest pain 09/14/2017  . B12 deficiency 09/13/2017  . Tachycardia 09/13/2017  . Acute ischemic stroke ([place])   . Seasonal allergies 07/13/2017  . TIA (transient ischemic attack) 07/12/2017  . Anemia 07/12/2017  . Pneumonia 06/02/2017  . Pleural effusion 06/02/2017  . Acute on chronic systolic congestive heart failure ([place])   . Elevated troponin   . Other fatigue 05/04/2017  . Dyspnea 05/04/2017  . Pseudophakia of both eyes 08/06/2016  . Chronic combined systolic and diastolic CHF (congestive heart failure) ([place]) 07/09/2016  . Hypertensive heart disease   . Hypertension   . Nuclear sclerosis, bilateral 05/24/2016  . HTN (hypertension) 01/22/2013  . TOBACCO ABUSE 02/18/2008  . CARDIOMYOPATHY  02/18/2008  . Diverticulosis of colon 02/18/2008  . OSTEOPENIA 02/18/2008  . Disorder of bone and cartilage 02/18/2008    Past Medical History:  Past Medical History:  Diagnosis Date  . Cardiomyopathy    a. EF of 25% in 07/2006 b. EF normalized by repeat echo in 2011 c. EF 35-40% by echo in 05/2017 with cath showing mild nonobstructive CAD  . Chronic combined systolic and diastolic CHF (congestive heart failure) ([place])   . Colon polyps   . Diverticulosis of colon   . Hypertension   . Hypertensive heart disease   . Osteopenia   . TIA (transient ischemic attack)   . Tobacco abuse    50 pack year   Past Surgical History:  Past Surgical History:  Procedure Laterality Date  . BREAST BIOPSY    . CARPAL TUNNEL RELEASE     right hand  . CERVICAL FUSION    . CESAREAN SECTION     2 times  . COLONOSCOPY  2778   pt uncertain as to whether polypectomy required or performed  . ECTOPIC PREGNANCY SURGERY    . IR ANGIO VERTEBRAL SEL SUBCLAVIAN INNOMINATE UNI R MOD SED  02/10/2018  . IR CT HEAD LTD  02/10/2018  . IR PERCUTANEOUS ART THROMBECTOMY/INFUSION INTRACRANIAL INC DIAG ANGIO  02/10/2018  . RADIOLOGY WITH ANESTHESIA N/A 02/10/2018   Procedure: RADIOLOGY WITH ANESTHESIA;  Surgeon: Luanne Bras, MD;  Location: [facility];  Service: Radiology;  Laterality: N/A;  . RIGHT/LEFT HEART CATH AND CORONARY ANGIOGRAPHY N/A 06/03/2017   Procedure: RIGHT/LEFT HEART CATH AND CORONARY ANGIOGRAPHY;  Surgeon: Jettie Booze, MD;  Location: [facility];  Service: Cardiovascular;  Laterality: N/A;  . TEE WITHOUT CARDIOVERSION N/A 02/13/2018   Procedure: TRANSESOPHAGEAL ECHOCARDIOGRAM (TEE);  Surgeon: Sueanne Margarita, MD;  Location[facility] ENDOSCOPY;  Service: Cardiovascular;  Laterality: N/A;  . TONSILLECTOMY      Assessment & Plan Clinical Impression: Patient is a 77 y.o. year old female with recent admission to the hospital on 11/15/2019[facility] hospital with left-sided weakness and right  gaze preference. Cranial CT scan showed mixed pattern of both acute suspected subacute right hemisphere ischemia in the setting of hyperdense right M1 MCA and acute left side weakness consistent with emergent large vessel occlusion on the right proximal MCA. CT angiogram head and neck acute right M1 MCA occlusion. Patient was transferred to [facility] underwent mechanical thrombectomy minimal recannulization of occluded right MCA M1 segment per interventional radiology.Follow-upMRI the brain showed large area infarction right MCA territory as well as petechial hemorrhage within the right basal ganglia.  Patient transferred to CIR on 02/16/2018 .    Patient currently requires total with basic self-care skills secondary to muscle weakness, impaired timing and sequencing, unbalanced muscle activation, motor apraxia, decreased coordination and decreased motor planning, decreased visual perceptual skills and field cut, decreased midline orientation, decreased attention to left, left side neglect and decreased motor planning, decreased awareness, decreased problem solving and decreased safety awareness and decreased sitting balance, decreased standing balance, decreased postural control, hemiplegia and decreased balance strategies.  Prior to hospitalization, patient could complete ADLs with independent .  Patient will benefit from skilled intervention to decrease level of assist with basic self-care skills and increase independence with basic self-care skills prior to discharge home with care partner.  Anticipate patient will require moderate physical assestance and follow up home health.  OT - End of Session Activity Tolerance: Tolerates 10 - 20 min activity with multiple rests Endurance Deficit: Yes OT Assessment Rehab Potential (ACUTE ONLY): Good OT Patient demonstrates impairments in the following area(s): Balance;Cognition;Edema;Endurance;Motor;Sensory;Safety;Perception;Pain;Vision OT Basic ADL's  Functional Problem(s): Eating;Grooming;Bathing;Dressing;Toileting OT Transfers Functional Problem(s): Toilet;Tub/Shower OT Additional Impairment(s): Fuctional Use of Upper Extremity OT Plan OT Intensity: Minimum of 1-2 x/day, 45 to 90 minutes OT Frequency: 5 out of 7 days OT Duration/Estimated Length of Stay: 26-28 days OT Treatment/Interventions: Balance/vestibular training;Community reintegration;Disease mangement/prevention;Functional electrical stimulation;Neuromuscular re-education;Patient/family education;Self Care/advanced ADL retraining;Splinting/orthotics;Therapeutic Exercise;UE/LE Coordination activities;UE/LE Strength taining/ROM;Therapeutic Activities;Psychosocial support;Pain management;Functional mobility training;DME/adaptive equipment instruction;Discharge planning;Cognitive remediation/compensation;Visual/perceptual remediation/compensation OT Self Feeding Anticipated Outcome(s): supervision OT Basic Self-Care Anticipated Outcome(s): mod to max assist OT Toileting Anticipated Outcome(s): max assist  OT Bathroom Transfers Anticipated Outcome(s): mod to max assist OT Recommendation Recommendations for Other Services: Neuropsych consult Patient destination: Home Follow Up Recommendations: 24 hour supervision/assistance Equipment Recommended: To be determined   Skilled Therapeutic Intervention Pt completed selfcare retraining sit to stand from the wheelchair.  Pt with increased cervical flexion and head rotation to the right noted in sitting, with inability to manage her secretions.  Max hand over hand for LUE use to wash the right arm.  Total assist for dynamic sitting balance with LOB posteriorly and to the left.  Total assist for all dressing with total +2 (pt 10%) for sit to stand X 3 to complete peri cleaning, donning new brief, and donning pants.  Pt able to scan across midline to the left spontaneously when cued by therapist to determine the number of fingers being held up,  but keeps head and eyes turned to the right with head flexion most of the time.  Discussed  expectations of progress and goals with pt and family at end of session.  She remained up at conclusion with nursing present to administer medications.    OT Evaluation Precautions/Restrictions  Precautions Precautions: Fall Precaution Comments: mild Lt inattention, left hemiplegia Restrictions Weight Bearing Restrictions: No  Pain  No report of pain when asked during session Home Living/Prior [place] expects to be discharged to:: Private residence Living Arrangements: Spouse/significant other Available Help at Discharge: Family, Available 24 hours/day Type of Home: House Home Access: Stairs to enter Entrance Stairs-Rails: None Home Layout: Two level Alternate Level Stairs-Number of Steps: 12 steps to basement Alternate Level Stairs-Rails: Right, Left, Can reach both Bathroom Shower/Tub: Multimedia programmer: Standard Bathroom Accessibility: Yes Additional Comments: Patient states no assistive devices other than shower seat  Lives With: Spouse IADL History Homemaking Responsibilities: Yes Meal Prep Responsibility: Primary Cleaning Responsibility: Secondary Occupation: Retired Leisure and Hobbies: Likes to play the piano Prior Function Level of Independence: Independent with basic ADLs  Able to [place]?: Yes Driving: Yes Vocation: Retired Comments: Hydrographic surveyor, drives, 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 ADL Eating: Minimal assistance Where Assessed-Eating: Wheelchair Grooming: Minimal assistance Where Assessed-Grooming: Clinical biochemist Bathing: Moderate cueing Where Assessed-Upper Body Bathing: Wheelchair Lower Body Bathing: Dependent Where Assessed-Lower Body Bathing: Wheelchair Upper Body Dressing: Dependent Where Assessed-Upper Body Dressing: Wheelchair Lower Body Dressing: Dependent Where Assessed-Lower Body  Dressing: Wheelchair Toileting: Dependent Where Assessed-Toileting: Bed level Toilet Transfer: Not assessed Toilet Transfer Method: Not assessed Tub/Shower Transfer: Not assessed Social research officer, government: Not assessed Vision Baseline Vision/History: No visual deficits Patient Visual Report: No change from baseline Vision Assessment?: Yes Eye Alignment: Within Functional Limits Ocular Range of Motion: Within Functional Limits Alignment/Gaze Preference: Gaze right;Head turned Tracking/Visual Pursuits: Impaired - to be further tested in functional context Saccades: Impaired - to be further tested in functional context Visual Fields: Left visual field deficit;Impaired-to be further tested in functional context Perception  Perception: Impaired Inattention/Neglect: Does not attend to left side of body Praxis Praxis: Impaired Praxis Impairment Details: Motor planning Cognition Overall Cognitive Status: Impaired/Different from baseline Arousal/Alertness: Awake/alert Orientation Level: Person;Place;Situation Person: Oriented Place: Oriented Situation: Oriented Year: 2019 Month: November Day of Week: Incorrect(Thursday) Memory: Impaired Memory Impairment: Storage deficit;Retrieval deficit Immediate Memory Recall: Sock;Blue;Bed Memory Recall: Sock;Blue;Bed Memory Recall Sock: Without Cue Memory Recall Blue: With Cue Memory Recall Bed: Without Cue Attention: Sustained;Selective Sustained Attention: Impaired Sustained Attention Impairment: Functional basic;Functional complex Selective Attention: Impaired Selective Attention Impairment: Functional basic;Functional complex Awareness: Impaired Awareness Impairment: Intellectual impairment Problem Solving: Impaired Problem Solving Impairment: Verbal basic;Functional basic Safety/Judgment: Impaired Comments: left neglect present Sensation Sensation Light Touch: Impaired Detail Light Touch Impaired Details: Absent LUE Hot/Cold: Not  tested Proprioception: Impaired Detail Proprioception Impaired Details: Absent LUE Stereognosis: Not tested Additional Comments: Pt unable to detect light touch or deep pressure throughout the LUE. Coordination Gross Motor Movements are Fluid and Coordinated: No Fine Motor Movements are Fluid and Coordinated: No Coordination and Movement Description: Brunnstrum stage I movement in the left arm and hand.  She needs max hand over hand assist to integrate into bathing tasks. Motor  Motor Motor: Hemiplegia Motor - Skilled Clinical Observations: Severe left hemiparesis Mobility  Transfers Sit to Stand: 2 Helpers  Trunk/Postural Assessment  Cervical Assessment Cervical Assessment: Exceptions to WFL(cervical flexion and rotation to the right noted at rest) Thoracic Assessment Thoracic Assessment: Exceptions to WFL(thoracic kyphosis as well as left side trunk elongation ) Lumbar Assessment Lumbar Assessment: Exceptions to  WFL(posterior pelvic tilt at rest) Postural Control Postural Control: Deficits on evaluation Protective Responses: Delayed with LOB to the left noted when sitting unsupported.   Balance Balance Balance Assessed: Yes Static Sitting Balance Static Sitting - Balance Support: Feet supported Static Sitting - Level of Assistance: 3: Mod assist Dynamic Sitting Balance Dynamic Sitting - Balance Support: During functional activity Dynamic Sitting - Level of Assistance: 1: +1 Total assist Static Standing Balance Static Standing - Balance Support: During functional activity Static Standing - Level of Assistance: 1: +2 Total assist;Patient percentage (comment)(10%) Dynamic Standing Balance Dynamic Standing - Level of Assistance: 1: +2 Total assist;Patient percentage (comment)(10%) Extremity/Trunk Assessment RUE Assessment RUE Assessment: Within Functional Limits General Strength Comments: AROM WFLS for ADLs LUE Assessment LUE Assessment: Exceptions to [facility] Passive Range of  Motion (PROM) Comments: WFLS for all joints General Strength Comments: Pt currently with slight increased edema noted in the left hand.  No active movement noted in the LUE at this time Brunnstrum stage I.     Refer to Care Plan for Long Term Goals  Recommendations for other services: Neuropsych   Discharge Criteria: Patient will be discharged from OT if patient refuses treatment 3 consecutive times without medical reason, if treatment goals not met, if there is a change in medical status, if patient makes no progress towards goals or if patient is discharged from hospital.  The above assessment, treatment plan, treatment alternatives and goals were discussed and mutually agreed upon: by patient and by family  Jamilynn Whitacre OTR/L 02/17/2018, 4:44 PM

## 2018-02-17 NOTE — Evaluation (Signed)
Physical Therapy Assessment and Plan  Patient Details  Name: Brandy [name] MRN: [id number] Date of Birth: [birth year]  PT Diagnosis: Abnormal posture, Abnormality of gait, Hemiparesis non-dominant, Hypertonia, Impaired cognition and Impaired sensation Rehab Potential: Fair ELOS: 3-4 weeks    Today's Date: 02/17/2018 PT Individual Time: 1330-1410 AND 1330-1410 PT Individual Time Calculation (min): 40 min  And 40 min   Problem List:  Patient Active Problem List   Diagnosis Date Noted  . Hemiparesis affecting left side as late effect of stroke ([place]) 02/16/2018  . Left-sided neglect 02/16/2018  . Right middle cerebral artery stroke ([place]) 02/16/2018  . Acute embolic stroke ([place])   . Acute right MCA stroke ([place])   . History of CVA with residual deficit   . Benign essential HTN   . Chronic combined systolic and diastolic congestive heart failure ([place])   . History of fusion of cervical spine   . Stage 3 chronic kidney disease ([place])   . Acute blood loss anemia   . Stroke ([place]) 02/10/2018  . Middle cerebral artery embolism, right 02/10/2018  . Health care maintenance 11/01/2017  . Non-ischemic cardiomyopathy ([place]) 09/14/2017  . Precordial chest pain 09/14/2017  . B12 deficiency 09/13/2017  . Tachycardia 09/13/2017  . Acute ischemic stroke ([place])   . Seasonal allergies 07/13/2017  . TIA (transient ischemic attack) 07/12/2017  . Anemia 07/12/2017  . Pneumonia 06/02/2017  . Pleural effusion 06/02/2017  . Acute on chronic systolic congestive heart failure ([place])   . Elevated troponin   . Other fatigue 05/04/2017  . Dyspnea 05/04/2017  . Pseudophakia of both eyes 08/06/2016  . Chronic combined systolic and diastolic CHF (congestive heart failure) ([place]) 07/09/2016  . Hypertensive heart disease   . Hypertension   . Nuclear sclerosis, bilateral 05/24/2016  . HTN (hypertension) 01/22/2013  . TOBACCO ABUSE 02/18/2008  . CARDIOMYOPATHY 02/18/2008  . Diverticulosis of colon 02/18/2008   . OSTEOPENIA 02/18/2008  . Disorder of bone and cartilage 02/18/2008    Past Medical History:  Past Medical History:  Diagnosis Date  . Cardiomyopathy    a. EF of 25% in 07/2006 b. EF normalized by repeat echo in 2011 c. EF 35-40% by echo in 05/2017 with cath showing mild nonobstructive CAD  . Chronic combined systolic and diastolic CHF (congestive heart failure) ([place])   . Colon polyps   . Diverticulosis of colon   . Hypertension   . Hypertensive heart disease   . Osteopenia   . TIA (transient ischemic attack)   . Tobacco abuse    50 pack year   Past Surgical History:  Past Surgical History:  Procedure Laterality Date  . BREAST BIOPSY    . CARPAL TUNNEL RELEASE     right hand  . CERVICAL FUSION    . CESAREAN SECTION     2 times  . COLONOSCOPY  6295   pt uncertain as to whether polypectomy required or performed  . ECTOPIC PREGNANCY SURGERY    . IR ANGIO VERTEBRAL SEL SUBCLAVIAN INNOMINATE UNI R MOD SED  02/10/2018  . IR CT HEAD LTD  02/10/2018  . IR PERCUTANEOUS ART THROMBECTOMY/INFUSION INTRACRANIAL INC DIAG ANGIO  02/10/2018  . RADIOLOGY WITH ANESTHESIA N/A 02/10/2018   Procedure: RADIOLOGY WITH ANESTHESIA;  Surgeon: Luanne Bras, MD;  Location: [facility];  Service: Radiology;  Laterality: N/A;  . RIGHT/LEFT HEART CATH AND CORONARY ANGIOGRAPHY N/A 06/03/2017   Procedure: RIGHT/LEFT HEART CATH AND CORONARY ANGIOGRAPHY;  Surgeon: Jettie Booze, MD;  Location: [facility] INVASIVE CV  LAB;  Service: Cardiovascular;  Laterality: N/A;  . TEE WITHOUT CARDIOVERSION N/A 02/13/2018   Procedure: TRANSESOPHAGEAL ECHOCARDIOGRAM (TEE);  Surgeon: Sueanne Margarita, MD;  Location: [facility] ENDOSCOPY;  Service: Cardiovascular;  Laterality: N/A;  . TONSILLECTOMY      Assessment & Plan Clinical Impression: Patient is a 77 year old right-handed female with history of cardiomyopathy, chronic combined systolic and diastolic congestive heart failure, hypertension,COPD/ tobacco abuse, CVA 06/2017 to  [facility] maintained on aspirin 162 mg daily. Per chart review patient lives with spouse. 2 level home with one step to entry. Reportedly independent prior to admission she still drives and prepares meals. She does have a housekeeper. [facility] left-sided weakness and right gaze preference. Cranial CT scan showed mixed pattern of both acute suspected subacute right hemisphere ischemia in the setting of hyperdense right M1 MCA and acute left side weakness consistent with emergent large vessel occlusion on the right proximal MCA. CT angiogram head and neck acute right M1 MCA occlusion. Patient was transferred to [facility] underwent mechanical thrombectomy minimal recannulization of occluded right MCA M1 segment per interventional radiology.Follow-upMRI the brain showed large area infarction right MCA territory as well as petechial hemorrhage within the right basal ganglia. Echocardiogram with ejection fraction of 40% diffuse hypokinesis. TEE completed 02/13/2018 with ejection fraction of 40% could not rule out early forming thrombus in the L AA versus artifact from posterior wall of the appendage. Multiple runs of nonsustained A. Tach and PACs were noted during procedure. Recommendations were for 30 day cardiac event monitor on discharge. Patient is maintained on aspirin and Plavix for CVA prophylaxis. Subcutaneous Lovenox for DVT prophylaxis. Dysphagia #1 thin liquid diet  Patient transferred to CIR on 02/16/2018 .   Patient currently requires total with mobility secondary to muscle weakness, muscle joint tightness and muscle paralysis, decreased cardiorespiratoy endurance, impaired timing and sequencing, abnormal tone, unbalanced muscle activation, motor apraxia, decreased coordination and decreased motor planning, decreased visual perceptual skills and decreased visual motor skills, decreased attention to left, left side neglect, decreased motor planning and  ideational apraxia, decreased initiation, decreased attention, decreased awareness, decreased problem solving, decreased safety awareness, decreased memory and delayed processing and decreased sitting balance, decreased standing balance, decreased postural control, hemiplegia and decreased balance strategies.  Prior to hospitalization, patient was modified independent  with mobility and lived with Spouse in a House home.  Home access is 4 steps with 1 rail   .  Patient will benefit from skilled PT intervention to maximize safe functional mobility, minimize fall risk and decrease caregiver burden for planned discharge home with 24 hour assist.  Anticipate patient will benefit from follow up [facility] at discharge.  PT - End of Session Activity Tolerance: Tolerates < 10 min activity, no significant change in vital signs Endurance Deficit: Yes PT Assessment Rehab Potential (ACUTE/IP ONLY): Fair PT Barriers to Discharge: [place] home environment;Decreased caregiver support;Home environment access/layout;Insurance for SNF coverage PT Patient demonstrates impairments in the following area(s): Balance;Behavior;Edema;Endurance;Motor;Nutrition;Pain;Perception;Safety;Sensory;Skin Integrity PT Transfers Functional Problem(s): Bed Mobility;Bed to Chair;Furniture;Floor;Car PT Locomotion Functional Problem(s): Ambulation;Wheelchair Mobility;Stairs PT Plan PT Intensity: Minimum of 1-2 x/day ,45 to 90 minutes PT Frequency: 5 out of 7 days PT Duration Estimated Length of Stay: 3-4 weeks  PT Treatment/Interventions: Ambulation/gait training;Community reintegration;Balance/vestibular training;Cognitive remediation/compensation;Discharge planning;Disease management/prevention;DME/adaptive equipment instruction;Functional electrical stimulation;Neuromuscular re-education;Functional mobility training;Pain management;Patient/family education;Psychosocial support;Skin care/wound management;Splinting/orthotics;Stair  training;Therapeutic Activities;Therapeutic Exercise;UE/LE Strength taining/ROM;UE/LE Coordination activities;Visual/perceptual remediation/compensation;Wheelchair propulsion/positioning PT Transfers Anticipated Outcome(s): Mod Assist with LRAD  PT Locomotion Anticipated Outcome(s): WC  level at min-mod assist  PT Recommendation Patient destination: Home Equipment Recommended: Wheelchair (measurements);Wheelchair cushion (measurements);Rolling walker with 5" wheels  Skilled Therapeutic Intervention Pt received supine in bed and agreeable to PT. Supine>sit transfer with total assist and max cues from PT for safety and sequencing. Pt unable to perform sit<>stand in stedy. SB transfer as listed below with total assist from PT. PT instructed patient in PT Evaluation and initiated treatment intervention; see below for results. PT educated patient in [place], rehab potential, rehab goals, and discharge recommendations.  Session 2.  Pt received sitting in WC and agreeable to PT. Sit<>stand at rail in hall x 2 with total assist. On second transfer, PT switched pt to TIS WC with +2 assist. Pt noted to have no active activation in the LLE and pushing to the L with the RUE and RLE. Patient returned to room and left sitting in [facility] with call [place] in reach and all needs met.       PT Evaluation Precautions/Restrictions Precautions Precautions: Fall Precaution Comments: mild Lt inattention, left hemiplegia Restrictions Weight Bearing Restrictions: No General   Vital SignsTherapy Vitals Temp: 98.7 F (37.1 C) Temp Source: Oral Pulse Rate: (!) 123 Resp: 20 BP: 116/64 Patient Position (if appropriate): Lying Oxygen Therapy SpO2: 99 % O2 Device: Room Air Pain   denies at rest Home Living/Prior [place] Available Help at Discharge: Family;Available 24 hours/day Type of Home: House Home Access: Stairs to enter Entrance Stairs-Rails: None Home Layout: Two level Alternate Level  Stairs-Number of Steps: 12 steps to basement Alternate Level Stairs-Rails: Right;Left;Can reach both Bathroom Shower/Tub: Multimedia programmer: Standard Bathroom Accessibility: Yes Additional Comments: Patient states no assistive devices other than shower seat  Lives With: Spouse Prior Function Level of Independence: Independent with basic ADLs  Able to Take Stairs?: Yes Driving: Yes Vocation: Retired Comments: Hydrographic surveyor, drives, 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 Vision/Perception    mild L neglect and R visual gaze preference  Cognition Overall Cognitive Status: Impaired/Different from baseline Arousal/Alertness: Awake/alert Attention: Sustained;Selective Sustained Attention: Impaired Sustained Attention Impairment: Functional basic;Functional complex Selective Attention: Impaired Selective Attention Impairment: Functional basic;Functional complex Memory: Impaired Memory Impairment: Storage deficit;Retrieval deficit Awareness: Impaired Awareness Impairment: Intellectual impairment Problem Solving: Impaired Safety/Judgment: Impaired Comments: left neglect present Sensation Sensation Light Touch: Impaired Detail Light Touch Impaired Details: Absent LUE Hot/Cold: Not tested Proprioception: Impaired Detail Proprioception Impaired Details: Absent LUE Stereognosis: Not tested Additional Comments: Pt unable to detect light touch or deep pressure throughout the LUE. Coordination Gross Motor Movements are Fluid and Coordinated: No Fine Motor Movements are Fluid and Coordinated: No Coordination and Movement Description: flexor response to pain in the LLE  Motor  Motor Motor: Hemiplegia Motor - Skilled Clinical Observations: Severe left hemiparesis UE and LE  Mobility Bed Mobility Bed Mobility: Rolling Right;Rolling Left;Supine to Sit;Sit to Supine Rolling Right: Total Assistance - Patient < 25% Rolling Left: Maximal Assistance - Patient 25-49% Supine to  Sit: Total Assistance - Patient < 25% Sit to Supine: Total Assistance - Patient < 25% Transfers Transfers: Sit to Stand;Stand to Sit;Lateral/Scoot Transfers Sit to Stand: 2 Helpers Stand to Sit: 2 Helpers Lateral/Scoot Transfers: Total Assistance - Patient < 25% Locomotion  Gait Ambulation: No Gait Gait: No Stairs / Additional Locomotion Stairs: No Wheelchair Mobility Wheelchair Mobility: Yes Wheelchair Assistance: Dependent - Patient 0% Wheelchair Parts Management: Other (comment)  Trunk/Postural Assessment  Cervical Assessment Cervical Assessment: Exceptions to WFL(cervical flexion and rotation to the right noted at rest)  Thoracic Assessment Thoracic Assessment: Exceptions to WFL(thoracic kyphosis as well as left side trunk elongation ) Lumbar Assessment Lumbar Assessment: Exceptions to WFL(posterior pelvic tilt at rest) Postural Control Postural Control: Deficits on evaluation Protective Responses: Delayed with LOB to the left noted when sitting unsupported.   Balance Balance Balance Assessed: Yes Static Sitting Balance Static Sitting - Balance Support: Feet supported Static Sitting - Level of Assistance: 3: Mod assist Dynamic Sitting Balance Dynamic Sitting - Balance Support: During functional activity Dynamic Sitting - Level of Assistance: 1: +1 Total assist Static Standing Balance Static Standing - Balance Support: During functional activity Static Standing - Level of Assistance: 1: +2 Total assist;Patient percentage (comment)(10%) Dynamic Standing Balance Dynamic Standing - Level of Assistance: 1: +2 Total assist;Patient percentage (comment)(10%) Extremity Assessment      RLE Assessment RLE Assessment: Exceptions to [facility] Active Range of Motion (AROM) Comments: mild limitation in ankle DF and knee flexion due to joint pain  General Strength Comments: grossly 4/5  LLE Assessment LLE Assessment: Exceptions to [facility] General Strength Comments: 0/5 active movement at  all joints. mild flexor response to pain     Refer to Care Plan for Long Term Goals  Recommendations for other services: Neuropsych  Discharge Criteria: Patient will be discharged from PT if patient refuses treatment 3 consecutive times without medical reason, if treatment goals not met, if there is a change in medical status, if patient makes no progress towards goals or if patient is discharged from hospital.  The above assessment, treatment plan, treatment alternatives and goals were discussed and mutually agreed upon: by patient and by family  [name] 02/17/2018, 4:21 PM

## 2018-02-17 NOTE — Progress Notes (Signed)
Social Work Assessment and Plan  Patient Details  Name: Brandy [name] MRN: [id number] Date of Birth: 23-Nov-1940  Today's Date: 02/17/2018  Problem List:  Patient Active Problem List   Diagnosis Date Noted  . Hemiparesis affecting left side as late effect of stroke ([place]) 02/16/2018  . Left-sided neglect 02/16/2018  . Right middle cerebral artery stroke ([place]) 02/16/2018  . Acute embolic stroke ([place])   . Acute right MCA stroke ([place])   . History of CVA with residual deficit   . Benign essential HTN   . Chronic combined systolic and diastolic congestive heart failure ([place])   . History of fusion of cervical spine   . Stage 3 chronic kidney disease ([place])   . Acute blood loss anemia   . Stroke ([place]) 02/10/2018  . Middle cerebral artery embolism, right 02/10/2018  . Health care maintenance 11/01/2017  . Non-ischemic cardiomyopathy ([place]) 09/14/2017  . Precordial chest pain 09/14/2017  . B12 deficiency 09/13/2017  . Tachycardia 09/13/2017  . Acute ischemic stroke ([place])   . Seasonal allergies 07/13/2017  . TIA (transient ischemic attack) 07/12/2017  . Anemia 07/12/2017  . Pneumonia 06/02/2017  . Pleural effusion 06/02/2017  . Acute on chronic systolic congestive heart failure ([place])   . Elevated troponin   . Other fatigue 05/04/2017  . Dyspnea 05/04/2017  . Pseudophakia of both eyes 08/06/2016  . Chronic combined systolic and diastolic CHF (congestive heart failure) ([place]) 07/09/2016  . Hypertensive heart disease   . Hypertension   . Nuclear sclerosis, bilateral 05/24/2016  . HTN (hypertension) 01/22/2013  . TOBACCO ABUSE 02/18/2008  . CARDIOMYOPATHY 02/18/2008  . Diverticulosis of colon 02/18/2008  . OSTEOPENIA 02/18/2008  . Disorder of bone and cartilage 02/18/2008   Past Medical History:  Past Medical History:  Diagnosis Date  . Cardiomyopathy    a. EF of 25% in 07/2006 b. EF normalized by repeat echo in 2011 c. EF 35-40% by echo in 05/2017 with cath showing mild  nonobstructive CAD  . Chronic combined systolic and diastolic CHF (congestive heart failure) ([place])   . Colon polyps   . Diverticulosis of colon   . Hypertension   . Hypertensive heart disease   . Osteopenia   . TIA (transient ischemic attack)   . Tobacco abuse    50 pack year   Past Surgical History:  Past Surgical History:  Procedure Laterality Date  . BREAST BIOPSY    . CARPAL TUNNEL RELEASE     right hand  . CERVICAL FUSION    . CESAREAN SECTION     2 times  . COLONOSCOPY  3846   pt uncertain as to whether polypectomy required or performed  . ECTOPIC PREGNANCY SURGERY    . IR ANGIO VERTEBRAL SEL SUBCLAVIAN INNOMINATE UNI R MOD SED  02/10/2018  . IR CT HEAD LTD  02/10/2018  . IR PERCUTANEOUS ART THROMBECTOMY/INFUSION INTRACRANIAL INC DIAG ANGIO  02/10/2018  . RADIOLOGY WITH ANESTHESIA N/A 02/10/2018   Procedure: RADIOLOGY WITH ANESTHESIA;  Surgeon: Luanne Bras, MD;  Location: [facility];  Service: Radiology;  Laterality: N/A;  . RIGHT/LEFT HEART CATH AND CORONARY ANGIOGRAPHY N/A 06/03/2017   Procedure: RIGHT/LEFT HEART CATH AND CORONARY ANGIOGRAPHY;  Surgeon: Jettie Booze, MD;  Location: [facility] CV LAB;  Service: Cardiovascular;  Laterality: N/A;  . TEE WITHOUT CARDIOVERSION N/A 02/13/2018   Procedure: TRANSESOPHAGEAL ECHOCARDIOGRAM (TEE);  Surgeon: Sueanne Margarita, MD;  Location: [facility];  Service: Cardiovascular;  Laterality: N/A;  . TONSILLECTOMY  Social History:  reports that she quit smoking about 9 years ago. Her smoking use included cigarettes. She started smoking about 57 years ago. She has a 50.00 pack-year smoking history. She has never used smokeless tobacco. She reports that she does not drink alcohol or use drugs.  Family / Support Systems Marital Status: Married How Long?: 40 years Patient Roles: Spouse, Other (Comment), Parent(musician for 2 churches) Spouse/Significant Other: Salem Mastrogiovanni - husband - [phone]) [phone] (h); [phone]  (m) Children: grown children Anticipated Caregiver: husband, daughter, family and friends Ability/Limitations of Caregiver: min/mod A - husand has arthritis in his knees and lower back Caregiver Availability: 24/7 Family Dynamics: Husband is very devoted and caring.  Seems like pt has supportive relationships.  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   Abuse/Neglect Abuse/Neglect Assessment Can Be Completed: Unable to assess, patient is non-responsive or altered mental status(Pt was not alone and her speech is impaired - no abuse suspected.  CSW will continue to monitor and assess for this when appropriate.) Physical Abuse: Denies Verbal Abuse: Denies Sexual Abuse: Denies Exploitation of patient/patient's resources: Denies Self-Neglect: Denies  Emotional Status [name] affect, behavior and adjustment status: Pt was tired during CSW visit and dozed on and off while CSW talked with her and her husband.  When she was awake, she was engaged in the conversation and answered questions. Recent Psychosocial Issues: Pt with recent stroke in April 2019 Psychiatric History: none reported Substance Abuse History: none reported  Patient / Family Perceptions, Expectations & Goals Pt/Family understanding of illness & functional limitations: [name] husband reports a good understanding of pt's condition and feels the medical team has answered their questions. Premorbid pt/family roles/activities: Pt likes to sing and play the piano/keyboard.  Musician for two churches. Anticipated changes in roles/activities/participation: Pt would like to resume activities as she is able. Pt/family expectations/goals: Pt would like to get back to playing the piano/keyboard.  Needs her  left arm/hand for this.  Community Resources Express Scripts: None Premorbid Home Care/DME Agencies: None Transportation available at discharge: family Resource referrals recommended: Neuropsychology, Support group (specify)(stroke support group)  Discharge Planning Living Arrangements: Spouse/significant other Support Systems: Spouse/significant other, Children, Other relatives, Friends/neighbors, Social worker community Type of Residence: Private residence Insurance Resources: Multimedia programmer (specify)([employer]) Financial Resources: Social Security Financial Screen Referred: No Money Management: Patient, Spouse Does the patient have any problems obtaining your medications?: No Home Management: Pt and husband were sharing home management chores until pt's stroke in April 2019. Patient/Family Preliminary Plans: [name] husband would like to take pt home if he and other family members can safely care for her at home, but he really wants what is best for pt.  CSW will work with husband on this throughout pt's stay. Social Work Anticipated Follow Up Needs: HH/OP, Support Group Expected length of stay: 24-28 days  Clinical Impression CSW met with pt and her husband to introduce self and role of CSW, as well as to complete assessment.  Pt was tired from therapies and was in and out of sleep and our conversation.  When she was awake, she was engaged and answered questions.  [name] husband is hoping to progresses to a manageable level of care for him to take her home, but ultimately wants what is best for her.  CSW will update him weekly after team conference  and in between as needed so that he and pt can make a good decision about where pt should go after CIR.  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  Pt also plays the piano and asked CSW to take her to one so she  could play.  CSW explained that she had an OT session coming up and needed to stay in her room for this.  Pt expressed understanding.  CSW did share this with the OT in case they can do this in future sessions - today is evaluation day.  No current concerns, needs, questions, but CSW will remain available as needed.  Darlean [place], Silvestre Mesi 02/17/2018, 2:44 PM

## 2018-02-18 ENCOUNTER — Inpatient Hospital Stay (HOSPITAL_COMMUNITY): Payer: Medicare Other | Admitting: Speech Pathology

## 2018-02-18 ENCOUNTER — Inpatient Hospital Stay (HOSPITAL_COMMUNITY): Payer: Medicare Other | Admitting: Physical Therapy

## 2018-02-18 ENCOUNTER — Inpatient Hospital Stay (HOSPITAL_COMMUNITY): Payer: Medicare Other | Admitting: Occupational Therapy

## 2018-02-18 NOTE — Progress Notes (Signed)
Occupational Therapy Session Note  Patient Details  Name: Brandy [name] MRN: [id number] Date of Birth: [birth year]  Today's Date: 02/18/2018 OT Individual Time: 8921-1941 OT Individual Time Calculation (min): 71 min    Short Term Goals: Week 1:  OT Short Term Goal 1 (Week 1): Pt will maintain static sitting balance EOB or EOM with min assist in preparation for selfcare tasks.  OT Short Term Goal 2 (Week 1): Pt will complete UB bathing with min assist in supported sitting for 2/3 sessions.  OT Short Term Goal 3 (Week 1): Pt will complete sit to stand with LB selfcare with total assist +2 Pt 25%.   OT Short Term Goal 4 (Week 1): Family will return demonstrate safe performance of PROM exercises for the left arm and hand.  OT Short Term Goal 5 (Week 1): Pt will complete sliding board transfer from bed to wheelchair with max assist.    Skilled Therapeutic Interventions/Progress Updates:    Pt worked on transfer from the tilt in space wheelchair to the bed for bathing and dressing during session.  Total assist +2 (pt 15%) for sliding board transfer with step underneath her feet for support.  Once she was sitting on the bed she demonstrated LOB to the right and forward.  Max assist for static sitting balance with total assist fir dynamic sitting balance when completing bathing tasks.  She was able to perform UB bathing with overall mod assist in supported sitting with total assist for UB dressing to donn bra and pullover shirt.  Transitioned to supine with total assist to work on LB bathing and dressing.  She was able to wash the front peri area with setup from supine position with HOB elevated.  Total assist for rolling to the right during washing of buttocks and for donning clothing.  Mod assist for rolling to the left.  Total assist for washing her lower legs from this position as well as donning all LB clothing.  Finished session with resting hand splint on the right hand and arm propped on pillows  for support and controlling of edema.  Left PRAFO also donned.  Provided handouts for positioning in bed as well as PROM exercises for the LUE.   Will need to review as therapist discussed them briefly but spouse will need hands on education.  Noted some increased swelling in the foot and hand on the left as well.  Pt left in bed with family present and call button and phone in reach.  Encouraged pt to work on scanning left for conversation with family and friends in the room.  Increased tone also noted in the left chest causing passive adduction of the arm.   Therapy Documentation Precautions:  Precautions Precautions: Fall Precaution Comments: mild Lt inattention, left hemiplegia Restrictions Weight Bearing Restrictions: No  Pain: Pain Assessment Pain Scale: Faces Faces Pain Scale: Hurts a little bit Pain Type: Acute pain Pain Location: Foot Pain Orientation: Right Pain Descriptors / Indicators: Discomfort Pain Onset: With Activity Pain Intervention(s): Repositioned;Emotional support ADL: See Care Tool Section for some ADL details  Therapy/Group: Individual Therapy  Dhiya Smits OTR/L 02/18/2018, 2:45 PM

## 2018-02-18 NOTE — Progress Notes (Signed)
Speech Language Pathology Daily Session Note  Patient Details  Name: Brandy [name] MRN: [id number] Date of Birth: 02-Aug-1940  Today's Date: 02/18/2018 SLP Individual Time: 1450-1533 SLP Individual Time Calculation (min): 43 min  Short Term Goals: Week 1: SLP Short Term Goal 1 (Week 1): Pt will consume dys 1 textures and thin liquids with mod cues for use of swallowing precautions and minimal overt s/s of aspiration.   SLP Short Term Goal 2 (Week 1): Pt will sustain her attention to basic, familiar tasks for 5 minutes with mod cues for redirection.   SLP Short Term Goal 3 (Week 1): Pt will locate items to the left of midline during functional tasks in >50% of opportunities with mod assist.   SLP Short Term Goal 4 (Week 1): Pt will complete basic, familiar tasks with max cues for functional problem solving.   SLP Short Term Goal 5 (Week 1): Pt will recall basic, daily information with mod assist verbal cues.   SLP Short Term Goal 6 (Week 1): Pt will increase vocal intensity to achieve intelligibility at the word level with mod assist.    Skilled Therapeutic Interventions:  Pt was seen for skilled ST targeting goals for dysphagia and cognition.  Pt was much more alert today in comparison to previous therapy sessions.  She had multiple family members at bedside and even spontaneously looked to the left when communicating with her family members.  [name] family had brought in apple cider and clam chowder per pt's request.  Pt was very motivated to eat and needed mod-max cues to slow rate due to impulsivity when eating foods that she liked.  Pt had fewer instances of anterior spillage today but she did have increased wetness and coughing as session progressed which SLP suspects is related to increased rate of PO intake.  SLP reviewed and reinforced swallowing precautions with pt's husband and sone.  Pt sustained her attention to self feeding tasks for ~5 minute intervals before requiring min cues for  redirection.  Pt was left in bed with bed alarm set and all need within reach.  Family at bedside.  Continue per current plan of care.   Pain Pain Assessment Pain Scale: 0-10 Pain Score: 0-No pain  Therapy/Group: Individual Therapy  Lehman Whiteley, Selinda Orion 02/18/2018, 4:51 PM

## 2018-02-18 NOTE — Progress Notes (Addendum)
CHRISTYANA CORWIN is a 77 y.o. female [birth year] [id number]  Subjective: Asleep. No new problems.   Objective: Vital signs in last 24 hours: Temp:  [97.9 F (36.6 C)-98.7 F (37.1 C)] 98.1 F (36.7 C) (11/23 0607) Pulse Rate:  [64-123] 109 (11/23 0858) Resp:  [18-20] 18 (11/23 0858) BP: (116-139)/(64-89) 139/66 (11/23 0607) SpO2:  [93 %-99 %] 93 % (11/23 0858) Weight change:  Last BM Date: 02/16/18  Intake/Output from previous day: 11/22 0701 - 11/23 0700 In: 500.1 [P.O.:100; I.V.:400.1] Out: 95 [Urine:95] Last cbgs: CBG (last 3)  No results for input(s): GLUCAP in the last 72 hours.   Physical Exam General: No apparent distress   HEENT: not dry Lungs: Normal effort. Lungs clear to auscultation, no crackles or wheezes. Cardiovascular: Regular rate and rhythm, no edema Abdomen: S/NT/ND; BS(+) Musculoskeletal:  unchanged Neurological: No new neurological deficits Wounds: N/A    Skin: clear  Aging changes Mental state:asleep    Lab Results: BMET    Component Value Date/Time   NA 143 02/17/2018 0511   K 4.0 02/17/2018 0511   CL 115 (H) 02/17/2018 0511   CO2 21 (L) 02/17/2018 0511   GLUCOSE 84 02/17/2018 0511   BUN 13 02/17/2018 0511   CREATININE 1.10 (H) 02/17/2018 0511   CREATININE 1.27 (H) 08/04/2017 1259   CALCIUM 8.7 (L) 02/17/2018 0511   GFRNONAA 48 (L) 02/17/2018 0511   GFRAA 55 (L) 02/17/2018 0511   CBC    Component Value Date/Time   WBC 9.3 02/17/2018 0511   RBC 2.88 (L) 02/17/2018 0511   HGB 8.2 (L) 02/17/2018 0511   HCT 26.3 (L) 02/17/2018 0511   PLT 173 02/17/2018 0511   MCV 91.3 02/17/2018 0511   MCH 28.5 02/17/2018 0511   MCHC 31.2 02/17/2018 0511   RDW 14.5 02/17/2018 0511   LYMPHSABS 0.7 02/17/2018 0511   MONOABS 1.0 02/17/2018 0511   EOSABS 0.2 02/17/2018 0511   BASOSABS 0.0 02/17/2018 0511    Studies/Results: No results found.  Medications: I have reviewed the patient's current medications.  Assessment/Plan: 1.Left side  weaknesssecondary to right MCA infarction with right M1 occlusion status post minimal recannulization after mechanical thrombectomy as well as history of left MCA infarction April 2019. Advise 30 day cardiac monitor on discharge. CIR 2.  DVT prophylaxis with Lovenox 3.  Pain management with Tylenol 4.  Hypertension.  Coreg 12.5 mg twice a day 5.  Cognition.  On Aricept 5 mg daily 6.  Cardiomyopathy with chronic combined heart failure.  Continue with Coreg 7.  COPD/tobacco abuse.  Counseling 8.  Hyperlipidemia.  On Lipitor 9.  Anemia.  Acute on chronic.  Monitor CBC 10.  History of gout     Length of stay, days: 2  Walker Kehr , MD 02/18/2018, 11:37 AM

## 2018-02-18 NOTE — Progress Notes (Signed)
Physical Therapy Session Note  Patient Details  Name: Brandy [name] MRN: [id number] Date of Birth: December 15, 1940  Today's Date: 02/18/2018 PT Individual Time: 0945-1100 PT Individual Time Calculation (min): 75 min   Short Term Goals: Week 1:  PT Short Term Goal 1 (Week 1): Pt will sit EOB for 3 min with Mod assist  PT Short Term Goal 2 (Week 1): Pt will perform WC<>bed transfer with max assist of 1.  PT Short Term Goal 3 (Week 1): Pt will perform sit<>stand in stedy with max assist  PT Short Term Goal 4 (Week 1): Pt will initiate WC mobility   Skilled Therapeutic Interventions/Progress Updates:      Pt received supine in bed and agreeable to PT. Maximove transfer to to [facility] with total assist +2. PT noted the BLE not supported properly in [place]. PT adjusted the length of Bil ELR to aide in contracture prevention, improved pressure redistribution, and pain management in the R knee. Sit<>stand in standing frame x 2 with max assist for trunk and LUE positioning and management to improve safety and alignment. Standing tolerance 2 x 2-3 min in standing frame with max-total assist to maintain neutral cervical , thoracic, and pelvic alignment. Pt able to correct pushing tendencies with max multimodal cues for proper WB over the RLE and reduced elbow extension. Patient returned to room and left sitting in [facility] with call Shaver in reach and all needs met.         Therapy Documentation Precautions:  Precautions Precautions: Fall Precaution Comments: mild Lt inattention, left hemiplegia Restrictions Weight Bearing Restrictions: No Pain: Pain Assessment Pain Scale: Faces Faces Pain Scale: Hurts a little bit Pain Type: Acute pain Pain Location: Foot Pain Orientation: Right Pain Descriptors / Indicators: Discomfort Pain Onset: With Activity Pain Intervention(s): Repositioned;Emotional support    Therapy/Group: Individual Therapy  [name] 02/18/2018, 3:01 PM

## 2018-02-19 ENCOUNTER — Inpatient Hospital Stay (HOSPITAL_COMMUNITY): Payer: Medicare Other

## 2018-02-19 NOTE — Progress Notes (Signed)
Brandy [name] is a 77 y.o. female [birth year] [id number]  Subjective: No new complaints. No new problems. Slept well. Poor appetite.  Objective: Vital signs in last 24 hours: Temp:  [99.1 F (37.3 C)-100.2 F (37.9 C)] 99.4 F (37.4 C) (11/24 0404) Pulse Rate:  [64-80] 68 (11/24 0404) Resp:  [18-21] 18 (11/24 0404) BP: (128-140)/(61-79) 140/79 (11/24 0404) SpO2:  [98 %-100 %] 98 % (11/24 0404) Weight change:  Last BM Date: 02/16/18  Intake/Output from previous day: 11/23 0701 - 11/24 0700 In: 1279.4 [P.O.:45; I.V.:1234.4] Out: -  Last cbgs: CBG (last 3)  No results for input(s): GLUCAP in the last 72 hours.   Physical Exam General: No apparent distress.  Husband is in the room HEENT: not dry Lungs: Normal effort. Lungs clear to auscultation, no crackles or wheezes. Cardiovascular: Regular rate and rhythm, no edema Abdomen: S/NT/ND; BS(+) Musculoskeletal:  unchanged Neurological: No new neurological deficits Wounds: N/A    Skin: clear  Aging changes Mental state: Alert, cooperative    Lab Results: BMET    Component Value Date/Time   NA 143 02/17/2018 0511   K 4.0 02/17/2018 0511   CL 115 (H) 02/17/2018 0511   CO2 21 (L) 02/17/2018 0511   GLUCOSE 84 02/17/2018 0511   BUN 13 02/17/2018 0511   CREATININE 1.10 (H) 02/17/2018 0511   CREATININE 1.27 (H) 08/04/2017 1259   CALCIUM 8.7 (L) 02/17/2018 0511   GFRNONAA 48 (L) 02/17/2018 0511   GFRAA 55 (L) 02/17/2018 0511   CBC    Component Value Date/Time   WBC 9.3 02/17/2018 0511   RBC 2.88 (L) 02/17/2018 0511   HGB 8.2 (L) 02/17/2018 0511   HCT 26.3 (L) 02/17/2018 0511   PLT 173 02/17/2018 0511   MCV 91.3 02/17/2018 0511   MCH 28.5 02/17/2018 0511   MCHC 31.2 02/17/2018 0511   RDW 14.5 02/17/2018 0511   LYMPHSABS 0.7 02/17/2018 0511   MONOABS 1.0 02/17/2018 0511   EOSABS 0.2 02/17/2018 0511   BASOSABS 0.0 02/17/2018 0511    Studies/Results: No results found.  Medications: I have reviewed the  patient's current medications.  Assessment/Plan:    1.  Right MCA infarction with right M1 occlusions.  Status post minimal recanalization after mechanical thrombectomy as well as history of left MCA infarction.  Left-sided weakness.  CIR 2.  DVT prophylaxis with Lovenox 3.  Pain management with Tylenol 4.  Hypertension.  On Coreg 5.  Cognition issues.  On Aricept 5 mg daily 6.  Chronic congestive heart failure.  Cardiomyopathy.  Continue with Coreg 7.  COPD with a history of tobacco abuse.  Counseling 8.  Hyperlipidemia.  On Lipitor 9.  Anemia.  Acute on chronic.  Monitor blood counts 10.  History of gout - no active symptoms   Length of stay, days: 3  Walker Kehr , MD 02/19/2018, 11:54 AM

## 2018-02-20 ENCOUNTER — Inpatient Hospital Stay (HOSPITAL_COMMUNITY): Payer: Medicare Other

## 2018-02-20 ENCOUNTER — Inpatient Hospital Stay (HOSPITAL_COMMUNITY): Payer: Medicare Other | Admitting: Physical Therapy

## 2018-02-20 ENCOUNTER — Inpatient Hospital Stay (HOSPITAL_COMMUNITY): Payer: Medicare Other | Admitting: Occupational Therapy

## 2018-02-20 ENCOUNTER — Other Ambulatory Visit: Payer: Self-pay | Admitting: Physician Assistant

## 2018-02-20 DIAGNOSIS — I10 Essential (primary) hypertension: Secondary | ICD-10-CM

## 2018-02-20 DIAGNOSIS — E8809 Other disorders of plasma-protein metabolism, not elsewhere classified: Secondary | ICD-10-CM

## 2018-02-20 DIAGNOSIS — E46 Unspecified protein-calorie malnutrition: Secondary | ICD-10-CM

## 2018-02-20 MED ORDER — PRO-STAT SUGAR FREE PO LIQD
30.0000 mL | Freq: Two times a day (BID) | ORAL | Status: DC
  Administered 2018-02-20 – 2018-03-16 (×47): 30 mL via ORAL
  Filled 2018-02-20 (×48): qty 30

## 2018-02-20 NOTE — Progress Notes (Signed)
Physical Therapy Session Note  Patient Details  Name: Brandy [name] MRN: [id number] Date of Birth: 12/13/1940  Today's Date: 02/20/2018 PT Individual Time: 1002-1032 PT Individual Time Calculation (min): 30 min   Short Term Goals: Week 1:  PT Short Term Goal 1 (Week 1): Pt will sit EOB for 3 min with Mod assist  PT Short Term Goal 2 (Week 1): Pt will perform WC<>bed transfer with max assist of 1.  PT Short Term Goal 3 (Week 1): Pt will perform sit<>stand in stedy with max assist  PT Short Term Goal 4 (Week 1): Pt will initiate WC mobility   Skilled Therapeutic Interventions/Progress Updates:    Patient received up in WC with husband present, pleasant and willing to participate in therapy but falling asleep during session; her husband reports she usually sleeps all day and is up during the night at home. Focused majority of session on education for L UE and hand PROM and positioning per handouts provided by therapy staff with husband requiring VC for correct hand placement but then able to return correct performance. All questions regarding these activities addressed by therapist. Patient able to participate in hand exercises however required cues to stay awake as she continued to fall asleep during session. Otherwise worked on L ankle and knee PROM (attempted active participation in visualization of exercise by patient but she continued to fall asleep). Cues for upright posture and gaze to left during session. She was left up in TIS WC with nurse techs and husband present, seat belt alarm active, and all needs otherwise met this morning.   Therapy Documentation Precautions:  Precautions Precautions: Fall Precaution Comments: mild Lt inattention, left hemiplegia Restrictions Weight Bearing Restrictions: No General:   Pain: Pain Assessment Pain Scale: Faces Pain Score: 0-No pain Faces Pain Scale: No hurt    Therapy/Group: Individual Therapy  Kristen Unger PT, DPT,  CBIS  Supplemental Physical Therapist [place]    Pager [phone] Acute Rehab Office [phone]   02/20/2018, 10:36 AM  

## 2018-02-20 NOTE — Progress Notes (Signed)
Speech Language Pathology Daily Session Note  Patient Details  Name: Brandy [name] MRN: [id number] Date of Birth: 12-27-1940  Today's Date: 02/20/2018 SLP Individual Time: 1500-1530 SLP Individual Time Calculation (min): 30 min  Short Term Goals: Week 1: SLP Short Term Goal 1 (Week 1): Pt will consume dys 1 textures and thin liquids with mod cues for use of swallowing precautions and minimal overt s/s of aspiration.   SLP Short Term Goal 2 (Week 1): Pt will sustain her attention to basic, familiar tasks for 5 minutes with mod cues for redirection.   SLP Short Term Goal 3 (Week 1): Pt will locate items to the left of midline during functional tasks in >50% of opportunities with mod assist.   SLP Short Term Goal 4 (Week 1): Pt will complete basic, familiar tasks with max cues for functional problem solving.   SLP Short Term Goal 5 (Week 1): Pt will recall basic, daily information with mod assist verbal cues.   SLP Short Term Goal 6 (Week 1): Pt will increase vocal intensity to achieve intelligibility at the word level with mod assist.    Skilled Therapeutic Interventions:Skilled ST services focused on cognitive skills and family education. SLP facilitated basic problem solving for sequencing 3 step cards required mod-min A verbal cues and locating three difference among 2 cards required max-mod A verbal cues. Pt demonstrated ability to scan left of midline during functional task with salient visual aids given max-mod A verbal cues. Pt requested "crab cake"and "crab soup" SLP educated pt/family that food can be brought in from home but must be pureed textured , check in food at nursing station and staff will provided food. SLP answered all questions to satisfaction. Pt was left in room with call [place] within reach, family present and chair alarm set. Recommend to continue skilled ST services.       Pain Pain Assessment Pain Score: 0-No pain  Therapy/Group: Individual Therapy  Laurin Morgenstern   [facility] 02/20/2018, 3:45 PM

## 2018-02-20 NOTE — Progress Notes (Signed)
Occupational Therapy Session Note  Patient Details  Name: Brandy [name] MRN: [id number] Date of Birth: Nov 21, 1940  Today's Date: 02/20/2018 OT Individual Time: 7001-7494 OT Individual Time Calculation (min): 59 min    Short Term Goals: Week 1:  OT Short Term Goal 1 (Week 1): Pt will maintain static sitting balance EOB or EOM with min assist in preparation for selfcare tasks.  OT Short Term Goal 2 (Week 1): Pt will complete UB bathing with min assist in supported sitting for 2/3 sessions.  OT Short Term Goal 3 (Week 1): Pt will complete sit to stand with LB selfcare with total assist +2 Pt 25%.   OT Short Term Goal 4 (Week 1): Family will return demonstrate safe performance of PROM exercises for the left arm and hand.  OT Short Term Goal 5 (Week 1): Pt will complete sliding board transfer from bed to wheelchair with max assist.    Skilled Therapeutic Interventions/Progress Updates:    Pt worked on bathing and dressing supine to sit EOB.  She needed max assist for rolling to the right and total assist for rolling to the left when washing buttocks and donning new brief and pants.  Total assist for washing lower legs and feet as well.  Had pt transition to sitting EOB with total assist.  Once sitting she needed max assist to maintain balance while working on UB bathing and dressing.  Total +2 (pt 20%) for pt to maintain balance with one therapist assisting her while the other provided hand over hand assist for washing the RUE with the LUE.  Total assist for donning pullover shirt as well as TEDs and shoes.  Total +2 (pt 10%) for sliding board transfer to the tilt in space wheelchair.  Pt left with spouse in the room with call button and phone in reach and safety alarm belt in place.    Therapy Documentation Precautions:  Precautions Precautions: Fall Precaution Comments: mild Lt inattention, left hemiplegia Restrictions Weight Bearing Restrictions: No  Pain: Pain Assessment Pain Scale:  Faces Pain Score: 0-No pain Faces Pain Scale: No hurt Pain Type: Acute pain Pain Location: Knee Pain Orientation: Right Pain Descriptors / Indicators: Discomfort Pain Onset: With Activity Pain Intervention(s): RN made aware;Repositioned ADL: See Care Tool Section for Details  Therapy/Group: Individual Therapy  Nakeysha Pasqual OTR/L 02/20/2018, 12:38 PM

## 2018-02-20 NOTE — Progress Notes (Signed)
Patient is alert and oriented. Went in with nurse techs to lay her down and check her brief. Patient stated she was not wet and refused to be checked. Educated patient on skin breakdown per incontinence. Patient states she knows when she has to void and move her bowels. Daughter, son-in-law and grandaughter at beside.  Silvestre Moment, LPN [id number]/72/5500, 16:42 PM

## 2018-02-20 NOTE — Progress Notes (Signed)
[facility] PHYSICAL MEDICINE & REHABILITATION PROGRESS NOTE   Subjective/Complaints: Patient seen laying in bed this morning.  She states she slept fairly overnight.  She states she has a good weekend.  Husband at bedside.  ROS: Denies CP, shortness of breath, nausea, vomiting, diarrhea.   Objective:   No results found. No results for input(s): WBC, HGB, HCT, PLT in the last 72 hours. No results for input(s): NA, K, CL, CO2, GLUCOSE, BUN, CREATININE, CALCIUM in the last 72 hours.  Intake/Output Summary (Last 24 hours) at 02/20/2018 0922 Last data filed at 02/20/2018 0814 Gross per 24 hour  Intake 600 ml  Output -  Net 600 ml     Physical Exam: Vital Signs Blood pressure (!) 142/82, pulse 70, temperature 98.7 F (37.1 C), temperature source Oral, resp. rate 12, height 5\' 4"  (1.626 m), weight 85.7 kg, SpO2 94 %.  Constitutional: No distress . Vital signs reviewed. HENT: Normocephalic.  Atraumatic. Eyes: EOMI. No discharge. Cardiovascular: RRR. No JVD. Respiratory: CTA Bilaterally. Normal effort. GI: BS +. Non-distended. Musc: No edema or tenderness in extremities. Neurological: She isalert. Dysarthria Follows commands. Left neglect Sensation absent to light touch left upper and lower extremity Motor: LUE/LLE: 0/5 proximal to distal Right upper extremity: 5/5 proximal distal Right lower extremity:?  2/5 proximal to distal Skin: Skin iswarmand dry.  Assessment/Plan: 1. Functional deficits secondary to right MCA infarction with right M1 occlusion status post minimal recannulization after mechanical thrombectomy as well as history of left MCA infarction April 2019. which require 3+ hours per day of interdisciplinary therapy in a comprehensive inpatient rehab setting.  Physiatrist is providing close team supervision and 24 hour management of active medical problems listed below.  Physiatrist and rehab team continue to assess barriers to discharge/monitor patient  progress toward functional and medical goals  Care Tool:  Bathing    Body parts bathed by patient: Left arm, Chest, Abdomen, Front perineal area, Right upper leg, Left upper leg   Body parts bathed by helper: Right lower leg, Left lower leg, Face, Buttocks, Right arm     Bathing assist Assist Level: 2 Helpers     Upper Body Dressing/Undressing Upper body dressing   What is the patient wearing?: Bra, Pull over shirt    Upper body assist Assist Level: Total Assistance - Patient < 25%    Lower Body Dressing/Undressing Lower body dressing      What is the patient wearing?: Pants     Lower body assist Assist for lower body dressing: Dependent - Patient 0%     Toileting Toileting    Toileting assist Assist for toileting: 2 Helpers     Transfers Chair/bed transfer  Transfers assist     Chair/bed transfer assist level: Dependent - Patient 0%     Locomotion Ambulation   Ambulation assist   Ambulation activity did not occur: Safety/medical concerns          Walk 10 feet activity   Assist  Walk 10 feet activity did not occur: Safety/medical concerns        Walk 50 feet activity   Assist Walk 50 feet with 2 turns activity did not occur: Safety/medical concerns         Walk 150 feet activity   Assist Walk 150 feet activity did not occur: Safety/medical concerns         Walk 10 feet on uneven surface  activity   Assist Walk 10 feet on uneven surfaces activity did not occur: Safety/medical concerns  Wheelchair     Assist   Type of Wheelchair: Agricultural engineer assist level: Dependent - Patient 0% Max wheelchair distance: 118ft    Wheelchair 50 feet with 2 turns activity    Assist        Assist Level: Dependent - Patient 0%   Wheelchair 150 feet activity     Assist Wheelchair 150 feet activity did not occur: Safety/medical concerns        Medical Problem List and Plan: 1.Left side  weaknesssecondary to right MCA infarction with right M1 occlusion status post minimal recannulization after mechanical thrombectomy as well as history of left MCA infarction April 2019. Advise 30 day cardiac monitor on discharge  Continue CIR  Notes reviewed- history of recent stroke with new stroke status post thrombectomy, images reviewed-MRI with right MCA CVA, labs reviewed 2. DVT Prophylaxis/Anticoagulation: Subcutaneous Lovenox. 3. Pain Management:Tylenol as needed 4. Mood:Aricept 5 mg daily 5. Neuropsych: This patienthe iscapable of making decisions on herown behalf. 6. Skin/Wound Care:Routine skin checks 7. Fluids/Electrolytes/Nutrition:Routine ins and outs  8. Cardiomyopathy/chronic combined diastolic congestive heart failure. Monitor for any signs of fluid overload. 9. Hypertension. Coreg 12.5 mg twice a day. Monitor with increased mobility Vitals:   02/20/18 0826 02/20/18 0855  BP: (!) 142/82   Pulse: 70   Resp:    Temp:    SpO2:  94%   Relatively controlled on 11/25 10. COPD/tobacco abuse. Continue inhalers. Provide counseling 11. Hyperlipidemia. Lipitor 12. Acute on chronic anemia.   Hemoglobin 8.2 on 11/22  Continue to monitor 13.  Hx gout left great toe does not appear acutely inflammed - tylenol prn, may need foot orthosis if pt has pain with WB 14.  CKD  Creatinine 1.10 on 11/22  Continue to monitor 15.  Hypoalbuminemia  Supplement initiated on 11/25  LOS: 4 days A FACE TO FACE EVALUATION WAS PERFORMED  Ankit [name] 02/20/2018, 9:22 AM

## 2018-02-20 NOTE — Progress Notes (Signed)
Physical Therapy Session Note  Patient Details  Name: Brandy [name] MRN: [id number] Date of Birth: September 02, 1940  Today's Date: 02/20/2018 PT Individual Time: 6440  - 1444 PT individual Time Calculation (min): 69 min      Short Term Goals: Week 1:  PT Short Term Goal 1 (Week 1): Pt will sit EOB for 3 min with Mod assist  PT Short Term Goal 2 (Week 1): Pt will perform WC<>bed transfer with max assist of 1.  PT Short Term Goal 3 (Week 1): Pt will perform sit<>stand in stedy with max assist  PT Short Term Goal 4 (Week 1): Pt will initiate WC mobility   Skilled Therapeutic Interventions/Progress Updates:  Pt presented in bed agreeable to therapy. Pt denies pain at present. PTA donned shoes total A and performed supine to sit with HOB elevated maxA. Total A for scooting to EOB. Pt participated in midline orientation performing leans to R and then stopping at midline with tactile cues. Participated in SB transfer maxA x 2 level transfer to L. Pt transported to gym and performed SB transfer to R max/total A x 2. Continued midline orientation with mirror feedback with pt able to briefly maintain midline at [place] before returning to L lateral lean. Pt was able to intermittently correct with mod/max multimodal cues. Pt performed various sitting reaching activities including reaching and placing horseshoes, and placing cards to L. Pt also performed lateral leans to R onto elbow and returning to midline requiring minA. Pt returned to [place] chair via SB max/totalA x 2 with block under feet for support. Pt then transported to [employer] and participated in [employer] mode A for scanning and encouragement for trunk flexion and midline orientation. Pt required min verbal cues for scanning to L approx 50% of time. Pt able to perform x 3 bouts of two min ea. Pt transported back to room and remained in [place] chair with family present and NT present for vitals check.      Therapy Documentation Precautions:   Precautions Precautions: Fall Precaution Comments: mild Lt inattention, left hemiplegia Restrictions Weight Bearing Restrictions: No General:   Vital Signs: Therapy Vitals Temp: 98.8 F (37.1 C) Temp Source: Oral Pulse Rate: 72 Resp: 18 BP: 139/68 Patient Position (if appropriate): Lying Oxygen Therapy SpO2: 99 % O2 Device: Room Air Pain:     Therapy/Group: Individual Therapy  [name]  [name], PTA  02/20/2018, 10:21 PM

## 2018-02-21 ENCOUNTER — Inpatient Hospital Stay (HOSPITAL_COMMUNITY): Payer: Medicare Other | Admitting: Occupational Therapy

## 2018-02-21 ENCOUNTER — Inpatient Hospital Stay (HOSPITAL_COMMUNITY): Payer: Medicare Other | Admitting: Physical Therapy

## 2018-02-21 ENCOUNTER — Inpatient Hospital Stay (HOSPITAL_COMMUNITY): Payer: Medicare Other

## 2018-02-21 DIAGNOSIS — I639 Cerebral infarction, unspecified: Secondary | ICD-10-CM

## 2018-02-21 MED ORDER — ORAL CARE MOUTH RINSE
15.0000 mL | Freq: Two times a day (BID) | OROMUCOSAL | Status: DC
  Administered 2018-02-21 – 2018-03-16 (×41): 15 mL via OROMUCOSAL

## 2018-02-21 NOTE — Progress Notes (Signed)
Occupational Therapy Session Note  Patient Details  Name: Brandy [name] MRN: [id number] Date of Birth: 1940-12-03  Today's Date: 02/21/2018 OT Individual Time: 1300-1410 OT Individual Time Calculation (min): 70 min    Short Term Goals: Week 1:  OT Short Term Goal 1 (Week 1): Pt will maintain static sitting balance EOB or EOM with min assist in preparation for selfcare tasks.  OT Short Term Goal 2 (Week 1): Pt will complete UB bathing with min assist in supported sitting for 2/3 sessions.  OT Short Term Goal 3 (Week 1): Pt will complete sit to stand with LB selfcare with total assist +2 Pt 25%.   OT Short Term Goal 4 (Week 1): Family will return demonstrate safe performance of PROM exercises for the left arm and hand.  OT Short Term Goal 5 (Week 1): Pt will complete sliding board transfer from bed to wheelchair with max assist.    Skilled Therapeutic Interventions/Progress Updates:    Upon entering the room, pt supine in bed with no c/o pain and husband present in room. Pt initially declining OT intervention with maximal encouragement to participate. Very foul odor coming from bed. Pt verbalizing that she can tell when she needs to use bathroom or when she has gone. However, pt's bed and clothing completely saturated with bowel and bladder. OT discussed skin integrity concerns with pt and husband and recommended 2 hour timed toileting. Pt rolling L <> R with max A and total A for hygiene and LB dressing from bed level. Supine >sit with total A to EOB. +2 assistance needed for safety with slide board transfer from bed >tilt in space wheelchair. Pt needing max cuing throughout session to manage secretions and swallow. OT assisted pt via wheelchair to day room for L UE PROM/self ROM in all planes and pt needing max verbal cuing to attend to L UE. Pt assisted to gym to transition into PT session.  Therapy Documentation Precautions:  Precautions Precautions: Fall Precaution Comments: mild Lt  inattention, left hemiplegia Restrictions Weight Bearing Restrictions: No  Pain: Pain Assessment Pain Score: 0-No pain ADL: ADL Eating: Minimal assistance Where Assessed-Eating: Wheelchair Grooming: Minimal assistance Where Assessed-Grooming: Wheelchair Upper Body Bathing: Moderate cueing Where Assessed-Upper Body Bathing: Wheelchair Lower Body Bathing: Dependent Where Assessed-Lower Body Bathing: Wheelchair Upper Body Dressing: Dependent Where Assessed-Upper Body Dressing: Wheelchair Lower Body Dressing: Dependent Where Assessed-Lower Body Dressing: Wheelchair Toileting: Dependent Where Assessed-Toileting: Bed level Toilet Transfer: Not assessed Toilet Transfer Method: Not assessed Tub/Shower Transfer: Not assessed Gaffer Transfer: Not assessed   Therapy/Group: Individual Therapy  Gypsy Decant 02/21/2018, 2:50 PM

## 2018-02-21 NOTE — Progress Notes (Signed)
Physical Therapy Session Note  Patient Details  Name: Brandy [name] MRN: [id number] Date of Birth: April 10, 1940  Today's Date: 02/21/2018 PT Individual Time: 8032-1224 PT Individual Time Calculation (min): 53 min   Short Term Goals: Week 1:  PT Short Term Goal 1 (Week 1): Pt will sit EOB for 3 min with Mod assist  PT Short Term Goal 2 (Week 1): Pt will perform WC<>bed transfer with max assist of 1.  PT Short Term Goal 3 (Week 1): Pt will perform sit<>stand in stedy with max assist  PT Short Term Goal 4 (Week 1): Pt will initiate WC mobility   Skilled Therapeutic Interventions/Progress Updates: Pt presented in TIS hand off from OT agreeable to therapy. Pt denies pain at present. Session focused on midline orientation and sitting balance. Participated in sitting in TIS upright without armrests and scanning/reaching for cards and placing on board. Pt requiring consistent minA and mod multimodal cues for correcting to midline when reaching for objects on L. Pt was able to scan to L with min cues. Participated in sitting ball toss with emphasis on reaching to R for lengthening of trunk. Pt noted to have mild carryover of midline orientation after activity. Practiced sitting balance with visual feedback with pt able to achieve without assist however unable to maintain for >1 min. Pt also performed active knee ROM kicking ball with pt noted to maintain midline tracking of ball during activity. Pt transported back to room at end of session and agreeable to remain in TIS chair for additional duration. Pt left with friends and family present, and current needs met.      Therapy Documentation Precautions:  Precautions Precautions: Fall Precaution Comments: mild Lt inattention, left hemiplegia Restrictions Weight Bearing Restrictions: No    Therapy/Group: Individual Therapy  Taraji Mungo  Braidon [place]mak, PTA  02/21/2018, 4:12 PM

## 2018-02-21 NOTE — Progress Notes (Signed)
Speech Language Pathology Daily Session Note  Patient Details  Name: Brandy [name] MRN: [id number] Date of Birth: 1940-12-27  Today's Date: 02/21/2018 SLP Individual Time: 1030-1130 SLP Individual Time Calculation (min): 60 min  Short Term Goals: Week 1: SLP Short Term Goal 1 (Week 1): Pt will consume dys 1 textures and thin liquids with mod cues for use of swallowing precautions and minimal overt s/s of aspiration.   SLP Short Term Goal 2 (Week 1): Pt will sustain her attention to basic, familiar tasks for 5 minutes with mod cues for redirection.   SLP Short Term Goal 3 (Week 1): Pt will locate items to the left of midline during functional tasks in >50% of opportunities with mod assist.   SLP Short Term Goal 4 (Week 1): Pt will complete basic, familiar tasks with max cues for functional problem solving.   SLP Short Term Goal 5 (Week 1): Pt will recall basic, daily information with mod assist verbal cues.   SLP Short Term Goal 6 (Week 1): Pt will increase vocal intensity to achieve intelligibility at the word level with mod assist.    Skilled Therapeutic Interventions:Skilled ST services focused on swallow and cognitive skills. Pt required max a verbal and tactile cues, including cold compress and repositioning, to gain alertness. SLP facilitated basic problem solving utilizing sequencing numbers 1-10, pt required mod-min A verbal cues, sequencing 1-20 pt required max-mod A verbal cues and max-mod A verbal/visual cues to locate items left of midline. [name] reduce sustained attention most impacted performance requiring max cues every 5 minutes increase to max A verbal cues every 2-3 minutes. SLP facilitated basic problem solving utilizing connect four task, however pt required max A verbal cues. SLP facilitated PO consumption of thin liquids via provale cup, washing cup and heating up cider brought in by family, pt consumed 3 sips with no overt s/s aspiration. SLP attempted to feed pt with early  lunch trya, however pt refused, SLP informed nurse tech to allow pt to rest and assist with feeding at end of meal time. Pt was left in room with call [place] within reach and bed alarm set. SLP reccomends to continue skilled services.     Pain Pain Assessment Pain Score: 0-No pain  Therapy/Group: Individual Therapy  Constantina Laseter  [facility] 02/21/2018, 11:41 AM

## 2018-02-21 NOTE — Progress Notes (Signed)
[facility] PHYSICAL MEDICINE & REHABILITATION PROGRESS NOTE   Subjective/Complaints: Patient seen laying in bed this AM.  She states she slept well overnight.  Husband at bedside states that patient's braces were not placed overnight.  ROS: Denies CP, shortness of breath, nausea, vomiting, diarrhea.   Objective:   No results found. No results for input(s): WBC, HGB, HCT, PLT in the last 72 hours. No results for input(s): NA, K, CL, CO2, GLUCOSE, BUN, CREATININE, CALCIUM in the last 72 hours.  Intake/Output Summary (Last 24 hours) at 02/21/2018 0816 Last data filed at 02/21/2018 0647 Gross per 24 hour  Intake 1101 ml  Output -  Net 1101 ml     Physical Exam: Vital Signs Blood pressure (!) 144/69, pulse 84, temperature 98.7 F (37.1 C), temperature source Oral, resp. rate 18, height 5\' 4"  (1.626 m), weight 85.7 kg, SpO2 100 %.  Constitutional: No distress . Vital signs reviewed. HENT: Normocephalic.  Atraumatic. Eyes: EOMI. No discharge. Cardiovascular: RRR.  No JVD. Respiratory: CTA bilaterally.  Normal effort. GI: BS +. Non-distended. Musc: No edema or tenderness in extremities. Neurological: She isalert. Dysarthria Follows commands. Left neglect Sensation absent to light touch left upper and lower extremity Motor: LUE/LLE: 0/5 proximal to distal Right upper extremity: 5/5 proximal distal Right lower extremity: ?2/5 proximal to distal Skin: Skin iswarmand dry.  Assessment/Plan: 1. Functional deficits secondary to right MCA infarction with right M1 occlusion status post minimal recannulization after mechanical thrombectomy as well as history of left MCA infarction April 2019. which require 3+ hours per day of interdisciplinary therapy in a comprehensive inpatient rehab setting.  Physiatrist is providing close team supervision and 24 hour management of active medical problems listed below.  Physiatrist and rehab team continue to assess barriers to  discharge/monitor patient progress toward functional and medical goals  Care Tool:  Bathing    Body parts bathed by patient: Left arm, Chest, Abdomen, Front perineal area, Face   Body parts bathed by helper: Right arm, Right upper leg, Left lower leg, Right lower leg, Left upper leg     Bathing assist Assist Level: 2 Helpers     Upper Body Dressing/Undressing Upper body dressing   What is the patient wearing?: Pull over shirt    Upper body assist Assist Level: Total Assistance - Patient < 25%    Lower Body Dressing/Undressing Lower body dressing      What is the patient wearing?: Pants     Lower body assist Assist for lower body dressing: 2 Helpers     Toileting Toileting    Toileting assist Assist for toileting: 2 Helpers     Transfers Chair/bed transfer  Transfers assist     Chair/bed transfer assist level: 2 Helpers(sliding board)     Locomotion Ambulation   Ambulation assist   Ambulation activity did not occur: Safety/medical concerns          Walk 10 feet activity   Assist  Walk 10 feet activity did not occur: Safety/medical concerns        Walk 50 feet activity   Assist Walk 50 feet with 2 turns activity did not occur: Safety/medical concerns         Walk 150 feet activity   Assist Walk 150 feet activity did not occur: Safety/medical concerns         Walk 10 feet on uneven surface  activity   Assist Walk 10 feet on uneven surfaces activity did not occur: Safety/medical concerns  Wheelchair     Assist   Type of Wheelchair: Agricultural engineer assist level: Dependent - Patient 0% Max wheelchair distance: 153ft    Wheelchair 50 feet with 2 turns activity    Assist        Assist Level: Dependent - Patient 0%   Wheelchair 150 feet activity     Assist Wheelchair 150 feet activity did not occur: Safety/medical concerns        Medical Problem List and Plan: 1.Left side  weaknesssecondary to right MCA infarction with right M1 occlusion status post minimal recannulization after mechanical thrombectomy as well as history of left MCA infarction April 2019. Advise 30 day cardiac monitor on discharge  Continue CIR  PRAFO/WHO nightly 2. DVT Prophylaxis/Anticoagulation: Subcutaneous Lovenox. 3. Pain Management:Tylenol as needed 4. Mood:Aricept 5 mg daily 5. Neuropsych: This patienthe iscapable of making decisions on herown behalf. 6. Skin/Wound Care:Routine skin checks 7. Fluids/Electrolytes/Nutrition:Routine ins and outs  8. Cardiomyopathy/chronic combined diastolic congestive heart failure. Monitor for any signs of fluid overload. 9. Hypertension. Coreg 12.5 mg twice a day. Monitor with increased mobility Vitals:   02/20/18 2113 02/21/18 0600  BP: 139/68 (!) 144/69  Pulse: 72 84  Resp: 18 18  Temp: 98.8 F (37.1 C) 98.7 F (37.1 C)  SpO2: 99% 100%   Labile on 11/26 10. COPD/tobacco abuse. Continue inhalers. Provide counseling 11. Hyperlipidemia. Lipitor 12. Acute on chronic anemia.   Hemoglobin 8.2 on 11/22  Continue to monitor 13.  Hx gout left great toe does not appear acutely inflammed - tylenol prn, may need foot orthosis if pt has pain with WB 14.  CKD  Creatinine 1.10 on 11/22  Continue to monitor 15.  Hypoalbuminemia  Supplement initiated on 11/25  LOS: 5 days A FACE TO FACE EVALUATION WAS PERFORMED   [name] 02/21/2018, 8:16 AM

## 2018-02-22 ENCOUNTER — Inpatient Hospital Stay (HOSPITAL_COMMUNITY): Payer: Medicare Other | Admitting: Occupational Therapy

## 2018-02-22 ENCOUNTER — Inpatient Hospital Stay (HOSPITAL_COMMUNITY): Payer: Medicare Other

## 2018-02-22 ENCOUNTER — Inpatient Hospital Stay (HOSPITAL_COMMUNITY): Payer: Medicare Other | Admitting: Physical Therapy

## 2018-02-22 ENCOUNTER — Inpatient Hospital Stay (HOSPITAL_COMMUNITY): Payer: Medicare Other | Admitting: Speech Pathology

## 2018-02-22 NOTE — Progress Notes (Signed)
Speech Language Pathology Weekly Progress and Session Note  Patient Details  Name: Brandy [name] MRN: [id number] Date of Birth: [birth year]  Beginning of progress report period: February 18, 2018 End of progress report period:  February 24, 2018   Today's Date: 02/24/2018 SLP Individual Time: 0800-0900 SLP Individual Time Calculation (min): 60 min  Short Term Goals: Week 1: SLP Short Term Goal 1 (Week 1): Pt will consume dys 2 textures and thin liquids with mod cues for use of swallowing precautions and minimal overt s/s of aspiration.   SLP Short Term Goal 1 - Progress (Week 1): Met SLP Short Term Goal 2 (Week 1): Pt will sustain her attention to basic, familiar tasks for 5 minutes with mod cues for redirection.   SLP Short Term Goal 2 - Progress (Week 1): Met SLP Short Term Goal 3 (Week 1): Pt will locate items to the left of midline during functional tasks in >50% of opportunities with mod assist.   SLP Short Term Goal 3 - Progress (Week 1): Not met SLP Short Term Goal 4 (Week 1): Pt will complete basic, familiar tasks with max cues for functional problem solving.   SLP Short Term Goal 4 - Progress (Week 1): Met SLP Short Term Goal 5 (Week 1): Pt will recall basic, daily information with mod assist verbal cues.   SLP Short Term Goal 5 - Progress (Week 1): Met SLP Short Term Goal 6 (Week 1): Pt will increase vocal intensity to achieve intelligibility at the word level with mod assist.   SLP Short Term Goal 6 - Progress (Week 1): Met    New Short Term Goals: Week 2: SLP Short Term Goal 1 (Week 2): Pt will consume dys 2 textures and thin liquids with supervision cues for use of swallowing precautions and minimal overt s/s of aspiration.   SLP Short Term Goal 2 (Week 2): Pt will sustain her attention to basic, familiar tasks for 5 minutes with Min A cues for redirection.   SLP Short Term Goal 3 (Week 2): Pt will locate items to the left of midline during functional tasks in >50% of  opportunities with Mod assist.   SLP Short Term Goal 4 (Week 2): Pt will complete basic, familiar tasks with Mod cues for functional problem solving.   SLP Short Term Goal 5 (Week 2): Pt will recall basic, daily information with Min assist verbal cues.   SLP Short Term Goal 6 (Week 2): Pt will increase vocal intensity to achieve intelligibility at the phrase level with mod assist to achieve > 75% intelligibility.    Weekly Progress Updates:   Pt has made functional gains this reporting period and has met 5 out of 6 short term goals.  Pt is currently mod assist for tasks due to moderate cognitive impairment.  Pt has demonstrated improved attentiveness, alertness, and scanning to the left of midline.  [name] diet has been advanced to dys 2, thin liquids which pt is consuming with min-mod cues for use of swallowing precautions, although cuing fluctuates greatly based on whether pt likes the food she's eating or not.  Pt and family education is ongoing.  Pt would continue to benefit from skilled ST while inpatient in order to maximize functional independence and reduce burden of care prior to discharge.  Anticipate that pt will need 24/7 supervision at discharge in addition to [place] follow at next level of care.      Intensity: Minumum of 1-2 x/day, 30 to 90 minutes Frequency: 3  to 5 out of 7 days Duration/Length of Stay: 21-28 days  Treatment/Interventions: Cognitive remediation/compensation;Cueing hierarchy;Dysphagia/aspiration precaution training;Internal/external aids;Environmental controls;Patient/family education;Functional tasks   Daily Session  Skilled Therapeutic Interventions: Skilled treatment session focused on dysphagia and cognition goals. SLP facilitated session by providing skilled observaiton of pt consuming dysphagia 2 items with thin liquids presented via [place] cup. Pt with decreased lingual manipulation of bolus d/t inattention. When cued pt able to effectively manipulate and  demonstrated complete oral clearing. Current diet continues to functional with full supervision for attention to task. Pt required Max A cues to locate items and speaker to left and to maintain gaze to left. Pt was increased speech intelligibility at the word level (> 95%) and required Mod A faded to Min A cues to increase vocal intensity at the phrase level to achieve ~ 50% intelligibility. Education provided to pt and husband on steady but slow progress. All questions answered to their satisfaction. Pt handed off to nursing.     General    Pain Pain Assessment Pain Scale: 0-10 Pain Score: 0-No pain  Therapy/Group: Individual Therapy  Vedha Tercero 02/24/2018, 8:41 AM

## 2018-02-22 NOTE — Progress Notes (Signed)
Occupational Therapy Session Note  Patient Details  Name: Brandy [name] MRN: [id number] Date of Birth: [birth year]  Today's Date: 02/22/2018 OT Individual Time: 1022-1102 OT Individual Time Calculation (min): 40 min    Short Term Goals: Week 1:  OT Short Term Goal 1 (Week 1): Pt will maintain static sitting balance EOB or EOM with min assist in preparation for selfcare tasks.  OT Short Term Goal 2 (Week 1): Pt will complete UB bathing with min assist in supported sitting for 2/3 sessions.  OT Short Term Goal 3 (Week 1): Pt will complete sit to stand with LB selfcare with total assist +2 Pt 25%.   OT Short Term Goal 4 (Week 1): Family will return demonstrate safe performance of PROM exercises for the left arm and hand.  OT Short Term Goal 5 (Week 1): Pt will complete sliding board transfer from bed to wheelchair with max assist.    Skilled Therapeutic Interventions/Progress Updates:    Pt worked on neuromuscular re-education during session in the therapy gym.  Total assist +2 (pt 20%) for sliding board transfer to the mat from the wheelchair, using Bobath technique.  She needed visual feedback from a mirror as well as max assist to maintain static sitting balance.  Focused session on maintaining upright trunk and head as well as neutral pelvis.  Max facilitation needed to complete functional reaching to the right side laterally with the RUE for picking up and placing clothespins on the bedside table.  Emphasis on maintaining cervical extension and trunk extension while reaching across the pushing side.  She was able to maintain static sitting balance better with target on the right side to bring her shoulder too in order to keep from pushing to the right side.  Finished session with sliding board transfer to the wheelchair and return to the room.  Pt tilted back in the tilt in space chair with safety alarm belt and call button in reach.  Spouse present at end of session as well.    Therapy  Documentation Precautions:  Precautions Precautions: Fall Precaution Comments: mild Lt inattention, left hemiplegia Restrictions Weight Bearing Restrictions: No  Pain: Pain Assessment Pain Scale: Faces Pain Score: 0-No pain ADL: Therapy/Group: Individual Therapy  Christapher Gillian OTR/L 02/22/2018, 12:18 PM

## 2018-02-22 NOTE — Progress Notes (Signed)
Physical Therapy Session Note  Patient Details  Name: Brandy [name] MRN: [id number] Date of Birth: 15-May-1940  Today's Date: 02/22/2018 PT Individual Time: 1350-1430 and 1503-1530  PT Individual Time Calculation (min): 40 min and 27 min  Short Term Goals: Week 1:  PT Short Term Goal 1 (Week 1): Pt will sit EOB for 3 min with Mod assist  PT Short Term Goal 2 (Week 1): Pt will perform WC<>bed transfer with max assist of 1.  PT Short Term Goal 3 (Week 1): Pt will perform sit<>stand in stedy with max assist  PT Short Term Goal 4 (Week 1): Pt will initiate WC mobility   Skilled Therapeutic Interventions/Progress Updates: Tx1: Pt presented in [place] chair with husband present agreeable to therapy. Session focused on sitting balance and midline orientation. Pt transported to rehab gym and performed SB transfer to R maxA x 2. Pt required max cues for anterior lean and R hand placement. Performed R lateral leans x 5 to elbow with minA to return to midline and tactile cues not to initiate L lateral lean. Performed reaching activities to R with mirror feedback to return to midline. Pt required tactile cues for maintaining head towards mirror for feedback. Pt also participated in reaching for cups to R and placing on bench in front of pt. Pt required minA for returning to upright posture once placing cup on bench. SB transfer to return to w/c maxA x 2 and pt transported back to room. Pt remained in TIS with belt alarm on, call Bantz within reach and needs met.   Tx2:  Pt presented in TIS agreeable to therapy. Pt transported down to atrium and participated in playing piano with R hand. Upon viewing piano pt noted to improve posture however unable to maintain. Pt was able to play x 4 songs with R hand with PTA providing tactile cues to maintain midline. PTA observed pt able to maintain midline gaze while playing piano and was able to perform R lateral reach without cues to reach high notes. Pt then returned to  room and remained in TIS at end of session with belt alarm on and needs met.      Therapy Documentation Precautions:  Precautions Precautions: Fall Precaution Comments: mild Lt inattention, left hemiplegia Restrictions Weight Bearing Restrictions: No General:   Vital Signs: Therapy Vitals Temp: 97.8 F (36.6 C) Temp Source: Oral Pulse Rate: 61 Resp: 17 BP: 127/64 Patient Position (if appropriate): Sitting Oxygen Therapy SpO2: 100 % O2 Device: Room Air Pain: Pain Assessment Pain Scale: 0-10 Pain Score: 0-No pain   Therapy/Group: Individual Therapy  Christpoher Sievers  Elnoria Livingston, PTA  02/22/2018, 3:38 PM

## 2018-02-22 NOTE — Progress Notes (Addendum)
[facility] PHYSICAL MEDICINE & REHABILITATION PROGRESS NOTE   Subjective/Complaints: Patient seen sitting up in bed this AM.  She states she slept fairly overnight.  She wants ice chips.    ROS: Denies CP, shortness of breath, nausea, vomiting, diarrhea.   Objective:   No results found. No results for input(s): WBC, HGB, HCT, PLT in the last 72 hours. No results for input(s): NA, K, CL, CO2, GLUCOSE, BUN, CREATININE, CALCIUM in the last 72 hours.  Intake/Output Summary (Last 24 hours) at 02/22/2018 0808 Last data filed at 02/21/2018 1809 Gross per 24 hour  Intake 504.13 ml  Output -  Net 504.13 ml     Physical Exam: Vital Signs Blood pressure (!) 141/72, pulse 84, temperature 98.8 F (37.1 C), temperature source Oral, resp. rate 18, height 5\' 4"  (1.626 m), weight 85.7 kg, SpO2 95 %.  Constitutional: No distress . Vital signs reviewed. HENT: Normocephalic.  Atraumatic. Eyes: EOMI. No discharge. Cardiovascular: RRR. No JVD. Respiratory: CTA bilaterally.  Normal effort. GI: BS +. Non-distended. Musc: Edema left hand and foot. Neurological: She isalert. Dysarthria Follows commands. Left inattention Sensation absent to light touch left upper and lower extremity Motor: LUE/LLE: 0/5 proximal to distal Right upper extremity: 5/5 proximal distal Right lower extremity: ?2/5 proximal to distal Skin: Skin iswarmand dry.  Assessment/Plan: 1. Functional deficits secondary to right MCA infarction with right M1 occlusion status post minimal recannulization after mechanical thrombectomy as well as history of left MCA infarction April 2019. which require 3+ hours per day of interdisciplinary therapy in a comprehensive inpatient rehab setting.  Physiatrist is providing close team supervision and 24 hour management of active medical problems listed below.  Physiatrist and rehab team continue to assess barriers to discharge/monitor patient progress toward functional and medical  goals  Care Tool:  Bathing    Body parts bathed by patient: Left arm, Chest, Abdomen, Front perineal area, Face   Body parts bathed by helper: Right arm, Right upper leg, Left lower leg, Right lower leg, Left upper leg     Bathing assist Assist Level: 2 Helpers     Upper Body Dressing/Undressing Upper body dressing   What is the patient wearing?: Pull over shirt    Upper body assist Assist Level: Total Assistance - Patient < 25%    Lower Body Dressing/Undressing Lower body dressing      What is the patient wearing?: Pants     Lower body assist Assist for lower body dressing: 2 Helpers     Toileting Toileting    Toileting assist Assist for toileting: 2 Helpers     Transfers Chair/bed transfer  Transfers assist     Chair/bed transfer assist level: 2 Helpers     Locomotion Ambulation   Ambulation assist   Ambulation activity did not occur: Safety/medical concerns          Walk 10 feet activity   Assist  Walk 10 feet activity did not occur: Safety/medical concerns        Walk 50 feet activity   Assist Walk 50 feet with 2 turns activity did not occur: Safety/medical concerns         Walk 150 feet activity   Assist Walk 150 feet activity did not occur: Safety/medical concerns         Walk 10 feet on uneven surface  activity   Assist Walk 10 feet on uneven surfaces activity did not occur: Safety/medical concerns         Wheelchair  Assist   Type of Wheelchair: Manual    Wheelchair assist level: Dependent - Patient 0% Max wheelchair distance: 147ft    Wheelchair 50 feet with 2 turns activity    Assist        Assist Level: Dependent - Patient 0%   Wheelchair 150 feet activity     Assist Wheelchair 150 feet activity did not occur: Safety/medical concerns        Medical Problem List and Plan: 1.Left side weaknesssecondary to right MCA infarction with right M1 occlusion status post minimal  recannulization after mechanical thrombectomy as well as history of left MCA infarction April 2019. Advise 30 day cardiac monitor on discharge  Continue CIR  PRAFO/WHO nightly  Team conference today to discuss current and goals and coordination of care, home and environmental barriers, and discharge planning with nursing, case manager, and therapies.  2. DVT Prophylaxis/Anticoagulation: Subcutaneous Lovenox. 3. Pain Management:Tylenol as needed 4. Mood:Aricept 5 mg daily 5. Neuropsych: This patienthe iscapable of making decisions on herown behalf. 6. Skin/Wound Care:Routine skin checks 7. Fluids/Electrolytes/Nutrition:Routine ins and outs  IVF [place] on 11/27 8. Cardiomyopathy/chronic combined diastolic congestive heart failure. Monitor for any signs of fluid overload. 9. Hypertension. Coreg 12.5 mg twice a day. Monitor with increased mobility Vitals:   02/21/18 2031 02/22/18 0533  BP: (!) 144/68 (!) 141/72  Pulse: 85 84  Resp: 16 18  Temp: 99.6 F (37.6 C) 98.8 F (37.1 C)  SpO2: 100% 95%   Slightly elevated on 11/27 10. COPD/tobacco abuse. Continue inhalers. Provide counseling 11. Hyperlipidemia. Lipitor 12. Acute on chronic anemia.   Hemoglobin 8.2 on 11/22  Continue to monitor 13.  Hx gout left great toe does not appear acutely inflammed - tylenol prn, may need foot orthosis if pt has pain with WB 14.  CKD  Creatinine 1.10 on 11/22  Continue to monitor 15.  Hypoalbuminemia  Supplement initiated on 11/25  LOS: 6 days A FACE TO FACE EVALUATION WAS PERFORMED  Ankit [name] 02/22/2018, 8:08 AM

## 2018-02-22 NOTE — Progress Notes (Signed)
Physical Therapy Session Note  Patient Details  Name: Brandy [name] MRN: [id number] Date of Birth: 1940/10/17  Today's Date: 02/22/2018 PT Individual Time: 0915-1000 PT Individual Time Calculation (min): 45 min   Short Term Goals: Week 1:  PT Short Term Goal 1 (Week 1): Pt will sit EOB for 3 min with Mod assist  PT Short Term Goal 2 (Week 1): Pt will perform WC<>bed transfer with max assist of 1.  PT Short Term Goal 3 (Week 1): Pt will perform sit<>stand in stedy with max assist  PT Short Term Goal 4 (Week 1): Pt will initiate WC mobility   Skilled Therapeutic Interventions/Progress Updates:    Pt supine in bed upon PT arrival, agreeable to therapy tx and denies pain. Total assist to don teds, socks and pants. Pt performed rolling in both directions with max assist while therapist pulled pants over hips. Pt transferred supine>sidelying with max assist and sidelying>sitting with max assist. Pt requiring min-mod assist for sitting balance, pushing noted to L side. Pt performed x 4 sitting>sidelying on R elbow working on overcorrecting L lateral lean/pushing. Pt performed slideboard transfer to TIS w/c with total assist +2, transported to dayroom. Pt worked on midline and reaching to R side for ban bags to over correct L lean, x 1 trial in standing frame and x 1 trial in high perch sitting on edge of w/c. Pt transported back to room and left seated with needs in reach.   Therapy Documentation Precautions:  Precautions Precautions: Fall Precaution Comments: mild Lt inattention, left hemiplegia Restrictions Weight Bearing Restrictions: No    Therapy/Group: Individual Therapy  Netta Corrigan, PT, DPT 02/22/2018, 7:51 AM

## 2018-02-22 NOTE — Progress Notes (Signed)
Speech Language Pathology Daily Session Note  Patient Details  Name: Brandy [name] MRN: [id number] Date of Birth: 1940/04/29  Today's Date: 02/22/2018 SLP Individual Time: 1300-1345 SLP Individual Time Calculation (min): 45 min  Short Term Goals: Week 1: SLP Short Term Goal 1 (Week 1): Pt will consume dys 1 textures and thin liquids with mod cues for use of swallowing precautions and minimal overt s/s of aspiration.   SLP Short Term Goal 2 (Week 1): Pt will sustain her attention to basic, familiar tasks for 5 minutes with mod cues for redirection.   SLP Short Term Goal 3 (Week 1): Pt will locate items to the left of midline during functional tasks in >50% of opportunities with mod assist.   SLP Short Term Goal 4 (Week 1): Pt will complete basic, familiar tasks with max cues for functional problem solving.   SLP Short Term Goal 5 (Week 1): Pt will recall basic, daily information with mod assist verbal cues.   SLP Short Term Goal 6 (Week 1): Pt will increase vocal intensity to achieve intelligibility at the word level with mod assist.    Skilled Therapeutic Interventions:  Pt was seen for skilled ST targeting cognitive and dysphagia goals.  SLP facilitated the session with a trial snack of dys 2 textures and thin liquids to continue working towards diet progression.  Pt consumed advanced textures with mild anterior labial spillage of boluses from the oral cavity only when she became distracted and tried to talk while eating.  Pt was able to problem solve use of safe swallowing strategies (ie "I need to just concentrate on my eating during meals.") with mod assist verbal cues and SLP reviewed precautions with pt's husband.  At this time, recommend diet advancement to dys 2 textures and thin liquids.  SLP also facilitated the session with a novel card game to address visual scanning and  sustained attention tot tasks.  Pt initially needed min verbal cues to orient to the cards on the left of midline;  however, as task progressed therapist was able to fade cues to supervision.  Pt sustained her attention to task for its duration (~15 min) with supervision cues for redirection.  Pt was returned to room and left in wheelchair with seat belt alarm set and all needs within reach, husband at bedside.  Continue per current plan of care.    Pain Pain Assessment Pain Scale: 0-10 Pain Score: 0-No pain  Therapy/Group: Individual Therapy  Brandy [name], Selinda Orion 02/22/2018, 3:19 PM

## 2018-02-23 LAB — CREATININE, SERUM: CREATININE: 0.91 mg/dL (ref 0.44–1.00)

## 2018-02-23 NOTE — Progress Notes (Signed)
[facility] PHYSICAL MEDICINE & REHABILITATION PROGRESS NOTE   Subjective/Complaints: Patient seen laying in bed this morning.  Husband at bedside.  She states she slept well overnight and had a good night.  She denies complaints.  ROS: Denies CP, shortness of breath, nausea, vomiting, diarrhea.   Objective:   No results found. No results for input(s): WBC, HGB, HCT, PLT in the last 72 hours. Recent Labs    02/23/18 0539  CREATININE 0.91    Intake/Output Summary (Last 24 hours) at 02/23/2018 0802 Last data filed at 02/22/2018 2100 Gross per 24 hour  Intake 380 ml  Output -  Net 380 ml     Physical Exam: Vital Signs Blood pressure (!) 142/70, pulse 73, temperature 98.6 F (37 C), resp. rate 18, height 5\' 4"  (1.626 m), weight 85.7 kg, SpO2 100 %.  Constitutional: No distress . Vital signs reviewed. HENT: Normocephalic.  Atraumatic. Eyes: EOMI. No discharge. Cardiovascular: RRR.  No JVD. Respiratory: CTA bilaterally.  Normal effort. GI: BS +. Non-distended. Musc: Edema left hand and foot. Neurological: She isalert. Dysarthria Follows commands. Left inattention Sensation absent to light touch left upper and lower extremity Motor: LUE/LLE: 0/5 proximal to distal Right upper extremity: 5/5 proximal distal Right lower extremity: ?2/5 proximal to distal Skin: Skin iswarmand dry.  Assessment/Plan: 1. Functional deficits secondary to right MCA infarction with right M1 occlusion status post minimal recannulization after mechanical thrombectomy as well as history of left MCA infarction April 2019. which require 3+ hours per day of interdisciplinary therapy in a comprehensive inpatient rehab setting.  Physiatrist is providing close team supervision and 24 hour management of active medical problems listed below.  Physiatrist and rehab team continue to assess barriers to discharge/monitor patient progress toward functional and medical goals  Care Tool:  Bathing     Body parts bathed by patient: Left arm, Chest, Abdomen, Front perineal area, Face   Body parts bathed by helper: Right arm, Right upper leg, Left lower leg, Right lower leg, Left upper leg     Bathing assist Assist Level: 2 Helpers     Upper Body Dressing/Undressing Upper body dressing   What is the patient wearing?: Pull over shirt    Upper body assist Assist Level: Total Assistance - Patient < 25%    Lower Body Dressing/Undressing Lower body dressing      What is the patient wearing?: Pants     Lower body assist Assist for lower body dressing: 2 Helpers     Toileting Toileting    Toileting assist Assist for toileting: 2 Helpers     Transfers Chair/bed transfer  Transfers assist     Chair/bed transfer assist level: 2 Helpers     Locomotion Ambulation   Ambulation assist   Ambulation activity did not occur: Safety/medical concerns          Walk 10 feet activity   Assist  Walk 10 feet activity did not occur: Safety/medical concerns        Walk 50 feet activity   Assist Walk 50 feet with 2 turns activity did not occur: Safety/medical concerns         Walk 150 feet activity   Assist Walk 150 feet activity did not occur: Safety/medical concerns         Walk 10 feet on uneven surface  activity   Assist Walk 10 feet on uneven surfaces activity did not occur: Safety/medical concerns         Wheelchair     Assist  Type of Wheelchair: Manual    Wheelchair assist level: Dependent - Patient 0% Max wheelchair distance: 180ft    Wheelchair 50 feet with 2 turns activity    Assist        Assist Level: Dependent - Patient 0%   Wheelchair 150 feet activity     Assist Wheelchair 150 feet activity did not occur: Safety/medical concerns        Medical Problem List and Plan: 1.Left side weaknesssecondary to right MCA infarction with right M1 occlusion status post minimal recannulization after mechanical  thrombectomy as well as history of left MCA infarction April 2019. Advise 30 day cardiac monitor on discharge  Continue CIR  PRAFO/WHO nightly 2. DVT Prophylaxis/Anticoagulation: Subcutaneous Lovenox. 3. Pain Management:Tylenol as needed 4. Mood:Aricept 5 mg daily 5. Neuropsych: This patienthe iscapable of making decisions on herown behalf. 6. Skin/Wound Care:Routine skin checks 7. Fluids/Electrolytes/Nutrition:Routine ins and outs  IVF [place] on 11/27 8. Cardiomyopathy/chronic combined diastolic congestive heart failure. Monitor for any signs of fluid overload. 9. Hypertension. Coreg 12.5 mg twice a day. Monitor with increased mobility Vitals:   02/22/18 2058 02/23/18 0407  BP: 135/69 (!) 142/70  Pulse: 66 73  Resp: 18 18  Temp: 98.6 F (37 C) 98.6 F (37 C)  SpO2: 99% 100%   Relatively controlled on 11/28 10. COPD/tobacco abuse. Continue inhalers. Provide counseling 11. Hyperlipidemia. Lipitor 12. Acute on chronic anemia.   Hemoglobin 8.2 on 11/22  Observe for tomorrow  Continue to monitor 13.  Hx gout left great toe does not appear acutely inflammed - tylenol prn, may need foot orthosis if pt has pain with WB 14.  CKD  Creatinine 0.91 on 11/28  Labs ordered for tomorrow  Continue to monitor 15.  Hypoalbuminemia  Supplement initiated on 11/25  LOS: 7 days A FACE TO FACE EVALUATION WAS PERFORMED  Brandy [name] [name] 02/23/2018, 8:02 AM

## 2018-02-23 NOTE — Plan of Care (Signed)
  Problem: Consults Goal: RH STROKE PATIENT EDUCATION Description See Patient Education module for education specifics  Outcome: Progressing   Problem: RH BOWEL ELIMINATION Goal: RH STG MANAGE BOWEL WITH ASSISTANCE Description STG Manage Bowel with min Assistance.  Outcome: Progressing Goal: RH STG MANAGE BOWEL W/MEDICATION W/ASSISTANCE Description STG Manage Bowel with Medication with min Assistance.  Outcome: Progressing   Problem: RH BLADDER ELIMINATION Goal: RH STG MANAGE BLADDER WITH ASSISTANCE Description STG Manage Bladder With min Assistance  Outcome: Progressing Goal: RH STG MANAGE BLADDER WITH MEDICATION WITH ASSISTANCE Description STG Manage Bladder With Medication With min Assistance.  Outcome: Progressing   Problem: RH SKIN INTEGRITY Goal: RH STG SKIN FREE OF INFECTION/BREAKDOWN Outcome: Progressing Goal: RH STG MAINTAIN SKIN INTEGRITY WITH ASSISTANCE Description STG Maintain Skin Integrity With min Assistance.  Outcome: Progressing   Problem: RH SAFETY Goal: RH STG ADHERE TO SAFETY PRECAUTIONS W/ASSISTANCE/DEVICE Description STG Adhere to Safety Precautions With mod Assistance/Device.  Outcome: Progressing Goal: RH STG DECREASED RISK OF FALL WITH ASSISTANCE Description STG Decreased Risk of Fall With min/mod Assistance.  Outcome: Progressing   Problem: RH COGNITION-NURSING Goal: RH STG USES MEMORY AIDS/STRATEGIES W/ASSIST TO PROBLEM SOLVE Description STG Uses Memory Aids/Strategies With min Assistance to Problem Solve.  Outcome: Progressing Goal: RH STG ANTICIPATES NEEDS/CALLS FOR ASSIST W/ASSIST/CUES Description STG Anticipates Needs/Calls for Assist With min Assistance/Cues.  Outcome: Progressing   Problem: RH PAIN MANAGEMENT Goal: RH STG PAIN MANAGED AT OR BELOW [name] PAIN GOAL Outcome: Progressing   Problem: RH KNOWLEDGE DEFICIT Goal: RH STG INCREASE KNOWLEDGE OF HYPERTENSION Outcome: Progressing Goal: RH STG INCREASE KNOWLEDGE OF  DYSPHAGIA/FLUID INTAKE Outcome: Progressing Goal: RH STG INCREASE KNOWLEGDE OF HYPERLIPIDEMIA Outcome: Progressing Goal: RH STG INCREASE KNOWLEDGE OF STROKE PROPHYLAXIS Outcome: Progressing   Problem: RH Vision Goal: RH LTG Vision (Specify) Outcome: Progressing

## 2018-02-24 ENCOUNTER — Inpatient Hospital Stay (HOSPITAL_COMMUNITY): Payer: Medicare Other | Admitting: Speech Pathology

## 2018-02-24 ENCOUNTER — Inpatient Hospital Stay (HOSPITAL_COMMUNITY): Payer: Medicare Other | Admitting: Occupational Therapy

## 2018-02-24 ENCOUNTER — Inpatient Hospital Stay (HOSPITAL_COMMUNITY): Payer: Medicare Other | Admitting: Physical Therapy

## 2018-02-24 DIAGNOSIS — D649 Anemia, unspecified: Secondary | ICD-10-CM

## 2018-02-24 DIAGNOSIS — N189 Chronic kidney disease, unspecified: Secondary | ICD-10-CM

## 2018-02-24 DIAGNOSIS — E876 Hypokalemia: Secondary | ICD-10-CM

## 2018-02-24 LAB — BASIC METABOLIC PANEL
Anion gap: 5 (ref 5–15)
BUN: 17 mg/dL (ref 8–23)
CHLORIDE: 113 mmol/L — AB (ref 98–111)
CO2: 24 mmol/L (ref 22–32)
CREATININE: 0.92 mg/dL (ref 0.44–1.00)
Calcium: 8.6 mg/dL — ABNORMAL LOW (ref 8.9–10.3)
GFR calc Af Amer: >60 mL/min (ref >60)
Glucose, Bld: 84 mg/dL (ref 70–99)
Potassium: 3.1 mmol/L — ABNORMAL LOW (ref 3.5–5.1)
SODIUM: 142 mmol/L (ref 135–145)

## 2018-02-24 LAB — CBC WITH DIFFERENTIAL/PLATELET
Abs Immature Granulocytes: 0.04 10*3/uL (ref 0.00–0.07)
BASOS PCT: 1 %
Basophils Absolute: 0.1 10*3/uL (ref 0.0–0.1)
EOS ABS: 0.2 10*3/uL (ref 0.0–0.5)
EOS PCT: 2 %
HCT: 25.9 % — ABNORMAL LOW (ref 36.0–46.0)
Hemoglobin: 7.8 g/dL — ABNORMAL LOW (ref 12.0–15.0)
Immature Granulocytes: 1 %
Lymphocytes Relative: 16 %
Lymphs Abs: 1.2 10*3/uL (ref 0.7–4.0)
MCH: 27 pg (ref 26.0–34.0)
MCHC: 30.1 g/dL (ref 30.0–36.0)
MCV: 89.6 fL (ref 80.0–100.0)
MONO ABS: 0.8 10*3/uL (ref 0.1–1.0)
MONOS PCT: 10 %
Neutro Abs: 5.1 10*3/uL (ref 1.7–7.7)
Neutrophils Relative %: 70 %
PLATELETS: 432 10*3/uL — AB (ref 150–400)
RBC: 2.89 MIL/uL — AB (ref 3.87–5.11)
RDW: 14.2 % (ref 11.5–15.5)
WBC: 7.3 10*3/uL (ref 4.0–10.5)
nRBC: 0 % (ref 0.0–0.2)

## 2018-02-24 MED ORDER — POTASSIUM CHLORIDE CRYS ER 20 MEQ PO TBCR
20.0000 meq | EXTENDED_RELEASE_TABLET | Freq: Two times a day (BID) | ORAL | Status: AC
  Administered 2018-02-24 – 2018-02-26 (×6): 20 meq via ORAL
  Filled 2018-02-24 (×6): qty 1

## 2018-02-24 NOTE — Progress Notes (Signed)
Social Work Patient ID: Brandy [name], female   DOB: 02/16/1941, 77 y.o.   MRN: [id number]   CSW met with pt and her husband on 02-22-18 to update them on team conference discussion.  Explained that team feels pt will need 4 weeks at CIR if the goal is for pt to return home, with mod to max A goals.  CSW talked with pt/husband about the possibility of pt needing to go for more rehab at SNF level of care, as mod to max A is a lot of care for one caregiver at home.  Pt/husband expressed understanding, but were not ready to make a decision yet on home vs. SNF, but appreciated CSW relaying information.  Pt was visibly disappointed by news of her goals.  CSW provided support.  CSW will continue to follow and assist as needed.

## 2018-02-24 NOTE — Progress Notes (Signed)
Occupational Therapy Session Note  Patient Details  Name: Brandy [name] MRN: [id number] Date of Birth: 11-09-1940  Today's Date: 02/24/2018 OT Individual Time: 7564-3329 and 5188-4166 OT Individual Time Calculation (min): 43 min 32 min  Short Term Goals: Week 1:  OT Short Term Goal 1 (Week 1): Pt will maintain static sitting balance EOB or EOM with min assist in preparation for selfcare tasks.  OT Short Term Goal 2 (Week 1): Pt will complete UB bathing with min assist in supported sitting for 2/3 sessions.  OT Short Term Goal 3 (Week 1): Pt will complete sit to stand with LB selfcare with total assist +2 Pt 25%.   OT Short Term Goal 4 (Week 1): Family will return demonstrate safe performance of PROM exercises for the left arm and hand.  OT Short Term Goal 5 (Week 1): Pt will complete sliding board transfer from bed to wheelchair with max assist.        Skilled Therapeutic Interventions/Progress Updates:    Pt greeted in bed with spouse present. Agreeable to complete oral care EOB to work on sitting balance. 2 helpers for supine<sit. Pt with Lt pushing and would try to correct orientation by pulling/pushing bedrail on Rt side. Facilitated hands in lap periodically and provided manual/tactile cues to increase awareness of midline. HOH for integrating L UE during oral care using toothbrush and toothpaste for NMR. Slideboard<Lt completed with 2 assist and manual facilitation for head/hip relationship and placement. She was able to lower onto elbow for board placement and return to midline with steady assist. Once in w/c, pt participated in hand washing and face washing with TIS in neutral position. Max A for correcting Lt sided LOBs and HOH for integrating L UE. Able to scan to Lt for needed items with min vcs. Pt left reclined in TIS with safety belt fastened and spouse present.   Noted skin indentation when Lt hand splint was removed. Hand appeared edematous. Discussed with RN to apply splint  loosely, and if redness appeared to stop using splint.   2nd Session 1:1 tx (32 min) Pt greeted in TIS with spouse present. Tx focus placed on caregiver education. Provided education regarding importance of PROM for contracture mgt and preservation of functional ranges. Demonstrated proper technique of several exercises with emphasis placed on joint protection and gentle stretching in end-ranges. Had pt visually attend to Lt arm and count to ten during exercises. Spouse had hands on practice afterwards with min-mod vcs for carryover of technique. Provided him with paper handout. Visitors also arrived near end of tx. Encouraged them to converse with pt on her Lt side to promote Lt attention/visual scanning. At end of session pt was reclined in TIS with visitors present.   Therapy Documentation Precautions:  Precautions Precautions: Fall Precaution Comments: mild Lt inattention, left hemiplegia Restrictions Weight Bearing Restrictions: No Pain: Pt denied pain during both sessions    ADL: ADL Eating: Minimal assistance Where Assessed-Eating: Wheelchair Grooming: Minimal assistance Where Assessed-Grooming: Wheelchair Upper Body Bathing: Moderate cueing Where Assessed-Upper Body Bathing: Wheelchair Lower Body Bathing: Dependent Where Assessed-Lower Body Bathing: Wheelchair Upper Body Dressing: Dependent Where Assessed-Upper Body Dressing: Wheelchair Lower Body Dressing: Dependent Where Assessed-Lower Body Dressing: Wheelchair Toileting: Dependent Where Assessed-Toileting: Bed level Toilet Transfer: Not assessed Toilet Transfer Method: Not assessed Tub/Shower Transfer: Not assessed Gaffer Transfer: Not assessed      Therapy/Group: Individual Therapy  Francetta Ilg A Nashley Cordoba 02/24/2018, 12:51 PM

## 2018-02-24 NOTE — Progress Notes (Signed)
Physical Therapy Session Note  Patient Details  Name: Brandy [name] MRN: [id number] Date of Birth: Apr 05, 1940  Today's Date: 02/24/2018 PT Individual Time: 1500-1615 PT Individual Time Calculation (min): 75 min   Short Term Goals: Week 1:  PT Short Term Goal 1 (Week 1): Pt will sit EOB for 3 min with Mod assist  PT Short Term Goal 2 (Week 1): Pt will perform WC<>bed transfer with max assist of 1.  PT Short Term Goal 3 (Week 1): Pt will perform sit<>stand in stedy with max assist  PT Short Term Goal 4 (Week 1): Pt will initiate WC mobility   Skilled Therapeutic Interventions/Progress Updates:   Pt received sitting in WC and agreeable to PT. Pt transported to rehab gym in [facility]. SB transfers to and from mat table to the L with total assist (pt performed ~15%). 8 inch step under pts feet for stability.   Static Sitting balance EOB x 15 min with R UE support. Noted to have bout of up to 1 minute sitting with supervision assist with visual feed back from mirror. Dynamic sitting balance to perform forward and R reaches x 15 minutes with mod -total assist. With max cues to attend to mirror for visual feed back, pt able to correct L LOB, when attention is focused on target for reach, pt unable to detect or correct LOB. Noted to have incontinent bladder.  Pt returned to chair as listed above.   Pt returned to room and performed SB transfer with total assist. Sit>supine completed with total assist from PT. Rolling R and L with Mod assist to the R and max assist to the R for peri care and clothing management.   Pt left in bed with call [place] in reach and all needs met.      Therapy Documentation Precautions:  Precautions Precautions: Fall Precaution Comments: mild Lt inattention, left hemiplegia Restrictions Weight Bearing Restrictions: No gVital Signs: Therapy Vitals Pulse Rate: 68 BP: (!) 151/73  Therapy/Group: Individual Therapy  [name] 02/24/2018, 5:54 PM

## 2018-02-24 NOTE — Patient Care Conference (Signed)
Inpatient RehabilitationTeam Conference and Plan of Care Update Date: 02/22/2018   Time: 2:45 PM    Patient Name: Brandy [name]      Medical Record Number: [id number]  Date of Birth: 1940-06-23 Sex: Female         Room/Bed: 4M02C/4M02C-01 Payor Info: Payor: [employer] MEDICARE / Plan: [employer] MEDICARE / Product Type: *No Product type* /    Admitting Diagnosis: stroke  Admit Date/Time:  02/16/2018  5:18 PM Admission Comments: No comment available   Primary Diagnosis:  <principal problem not specified> Principal Problem: <principal problem not specified>  Patient Active Problem List   Diagnosis Date Noted  . Chronic kidney disease   . Hypokalemia   . Acute on chronic anemia   . Hypoalbuminemia due to protein-calorie malnutrition ([place])   . Hemiparesis affecting left side as late effect of stroke ([place]) 02/16/2018  . Left-sided neglect 02/16/2018  . Right middle cerebral artery stroke ([place]) 02/16/2018  . Acute embolic stroke ([place])   . Acute right MCA stroke ([place])   . History of CVA with residual deficit   . Benign essential HTN   . Chronic combined systolic and diastolic congestive heart failure ([place])   . History of fusion of cervical spine   . Stage 3 chronic kidney disease ([place])   . Acute blood loss anemia   . Stroke ([place]) 02/10/2018  . Middle cerebral artery embolism, right 02/10/2018  . Health care maintenance 11/01/2017  . Non-ischemic cardiomyopathy ([place]) 09/14/2017  . Precordial chest pain 09/14/2017  . B12 deficiency 09/13/2017  . Tachycardia 09/13/2017  . Acute ischemic stroke ([place])   . Seasonal allergies 07/13/2017  . TIA (transient ischemic attack) 07/12/2017  . Anemia 07/12/2017  . Pneumonia 06/02/2017  . Pleural effusion 06/02/2017  . Acute on chronic systolic congestive heart failure ([place])   . Elevated troponin   . Other fatigue 05/04/2017  . Dyspnea 05/04/2017  . Pseudophakia of both eyes 08/06/2016  . Chronic combined systolic and diastolic CHF  (congestive heart failure) ([place]) 07/09/2016  . Hypertensive heart disease   . Hypertension   . Nuclear sclerosis, bilateral 05/24/2016  . HTN (hypertension) 01/22/2013  . TOBACCO ABUSE 02/18/2008  . CARDIOMYOPATHY 02/18/2008  . Diverticulosis of colon 02/18/2008  . OSTEOPENIA 02/18/2008  . Disorder of bone and cartilage 02/18/2008    Expected Discharge Date: Expected Discharge Date: (4 weeks vs. SNF)  Team Members Present: Physician leading conference: Dr. Delice Lesch Social Worker Present: Alfonse Alpers, LCSW Nurse Present: Frances Maywood, RN PT Present: Phylliss Bob, PTA;Victoria Sabra Heck, PT OT Present: Clyda Greener, OT SLP Present: Windell Moulding, SLP PPS Coordinator present : Daiva Nakayama, RN, CRRN     Current Status/Progress Goal Weekly Team Focus  Medical   Left side weakness secondary to right MCA infarction with right M1 occlusion status post minimal recannulization after mechanical thrombectomy as well as history of left MCA infarction April 2019  Improve mobility, swallowing, transfers, self-care  See above   Bowel/Bladder   Incontinent of bowel/bladder LBM 02/20/18, continue Sena and Sorbital prn  Restore Continence   Assess tolieting needs Q 2 hrs and prn,, initiate bowel program   Swallow/Nutrition/ Hydration   advanced to dys 2, thin liquids due to improved mentation  supervision   toleration of advanced solids    ADL's   Mod assist for UB bathing in supported sitting, with max assist for UB dressing.  Total assist for LB dressing in supine.  Total +2 for sliding board transfers from surface to  surface.  mod assist for static sitting balance and max assist for dynamic sitting balance.    mod to max assist   neuromuscular re-education, selfcare retraining, transfer training, balance retraining, splinting, therapeutic exercise   Mobility             Communication   min-mod assist for intelligibility due to dysarthria   supervision   education and carryvoer of  compensatory strategies    Safety/Cognition/ Behavioral Observations  mod assist for basic  min assist   problem solving, attention, memory, awareness     Pain   No c/o pain in last 46 hrs   ,< 2  Assess QS and prn , medicate and monitor   Skin   Ecchomotic areas to left arm that are resolving, no skin tears or skin breakage   no report skin tears or injuries while o rehabe  Assess and monitor closely for skin issues, turn and reposition q2 hrs and prn     Rehab Goals Patient on target to meet rehab goals: Yes Rehab Goals Revised: none *See Care Plan and progress notes for long and short-term goals.     Barriers to Discharge  Current Status/Progress Possible Resolutions Date Resolved   Physician    Medical stability;Nutrition means     See above  Therapies, optimize BP meds, follow labs, advance diet as tolerated      Nursing                  PT                    OT                  SLP                SW                Discharge Planning/Teaching Needs:  [name] husband wants to take pt home, but not sure he can handle pt if her care is more than min A.  family education closer to pt's d/c   Team Discussion:  Pt with blood pressure issues and has acute on chronic anemia and kidney disease - Dr. Posey Pronto is watching labs.  Pt is incontinent.  Pt is min A for UB bathing and total A for LB bathing.  Pt is total A +2 to complete sit to stands.  Pt with L inattention, but will scan to L with cueing.  Pt has no sensation in her LUE.  Pt pushes to the L and needs max A for sitting balance.  Pt is max A bed mobility and +2 for transfers.  She leans to the L, but can correct.  PT tried pt in standing frame; she fatigues quickly in therapy.  Pt needs lot of cueing to stay midline.  ST reports pt has perked up since initial evaluation and is more purposeful and alert.  She does have lots of cognitive deficits.  Revisions to Treatment Plan:  none    Continued Need for Acute Rehabilitation  Level of Care: The patient requires daily medical management by a physician with specialized training in physical medicine and rehabilitation for the following conditions: Daily direction of a multidisciplinary physical rehabilitation program to ensure safe treatment while eliciting the highest outcome that is of practical value to the patient.: Yes Daily medical management of patient stability for increased activity during participation in an intensive rehabilitation regime.: Yes Daily analysis of laboratory values  and/or radiology reports with any subsequent need for medication adjustment of medical intervention for : Neurological problems;Blood pressure problems;Renal problems;Other   I attest that I was present, lead the team conference, and concur with the assessment and plan of the team.   Paarth Cropper, Silvestre Mesi 02/24/2018, 4:37 PM

## 2018-02-24 NOTE — Progress Notes (Signed)
[place] PHYSICAL MEDICINE & REHABILITATION PROGRESS NOTE   Subjective/Complaints: Patient seen sitting up in bed working with SLP this morning.  She states she slept well overnight.  ROS: Denies CP, shortness of breath, nausea, vomiting, diarrhea.  Objective:   No results found. Recent Labs    02/24/18 0638  WBC 7.3  HGB 7.8*  HCT 25.9*  PLT 432*   Recent Labs    02/23/18 0539 02/24/18 0638  NA  --  142  K  --  3.1*  CL  --  113*  CO2  --  24  GLUCOSE  --  84  BUN  --  17  CREATININE 0.91 0.92  CALCIUM  --  8.6*    Intake/Output Summary (Last 24 hours) at 02/24/2018 0840 Last data filed at 02/23/2018 1640 Gross per 24 hour  Intake 180 ml  Output -  Net 180 ml     Physical Exam: Vital Signs Blood pressure 139/65, pulse 69, temperature 98.3 F (36.8 C), resp. rate 16, height 5\' 4"  (1.626 m), weight 85.7 kg, SpO2 96 %.  Constitutional: No distress . Vital signs reviewed. HENT: Normocephalic.  Atraumatic. Eyes: EOMI. No discharge. Cardiovascular: RRR. No JVD. Respiratory: CTA bilaterally.  Normal effort. GI: BS +. Non-distended. Musc: Edema left hand and foot. Neurological: Alert. Dysarthria Follows commands. Left inattention Sensation absent to light touch left upper and lower extremity Motor: LUE/LLE: 0/5 proximal to distal, unchanged Right upper extremity: 5/5 proximal distal Right lower extremity: ?2/5 hip flexion, knee extension, 4+/5 ankle dorsiflexion Skin: Skin iswarmand dry.  Assessment/Plan: 1. Functional deficits secondary to right MCA infarction with right M1 occlusion status post minimal recannulization after mechanical thrombectomy as well as history of left MCA infarction April 2019. which require 3+ hours per day of interdisciplinary therapy in a comprehensive inpatient rehab setting.  Physiatrist is providing close team supervision and 24 hour management of active medical problems listed below.  Physiatrist and rehab team  continue to assess barriers to discharge/monitor patient progress toward functional and medical goals  Care Tool:  Bathing    Body parts bathed by patient: Left arm, Chest, Abdomen, Front perineal area, Face   Body parts bathed by helper: Right arm, Right upper leg, Left lower leg, Right lower leg, Left upper leg     Bathing assist Assist Level: 2 Helpers     Upper Body Dressing/Undressing Upper body dressing   What is the patient wearing?: Pull over shirt    Upper body assist Assist Level: Total Assistance - Patient < 25%    Lower Body Dressing/Undressing Lower body dressing      What is the patient wearing?: Pants     Lower body assist Assist for lower body dressing: 2 Helpers     Toileting Toileting    Toileting assist Assist for toileting: 2 Helpers     Transfers Chair/bed transfer  Transfers assist     Chair/bed transfer assist level: 2 Helpers     Locomotion Ambulation   Ambulation assist   Ambulation activity did not occur: Safety/medical concerns          Walk 10 feet activity   Assist  Walk 10 feet activity did not occur: Safety/medical concerns        Walk 50 feet activity   Assist Walk 50 feet with 2 turns activity did not occur: Safety/medical concerns         Walk 150 feet activity   Assist Walk 150 feet activity did not occur: Safety/medical concerns  Walk 10 feet on uneven surface  activity   Assist Walk 10 feet on uneven surfaces activity did not occur: Safety/medical concerns         Wheelchair     Assist   Type of Wheelchair: Manual    Wheelchair assist level: Dependent - Patient 0% Max wheelchair distance: 193ft    Wheelchair 50 feet with 2 turns activity    Assist        Assist Level: Dependent - Patient 0%   Wheelchair 150 feet activity     Assist Wheelchair 150 feet activity did not occur: Safety/medical concerns        Medical Problem List and Plan: 1.Left  side weaknesssecondary to right MCA infarction with right M1 occlusion status post minimal recannulization after mechanical thrombectomy as well as history of left MCA infarction April 2019. Advise 30 day cardiac monitor on discharge  Continue CIR  PRAFO/WHO nightly 2. DVT Prophylaxis/Anticoagulation: Subcutaneous Lovenox. 3. Pain Management:Tylenol as needed 4. Mood:Aricept 5 mg daily 5. Neuropsych: This patienthe iscapable of making decisions on herown behalf. 6. Skin/Wound Care:Routine skin checks 7. Fluids/Electrolytes/Nutrition:Routine ins and outs  IVF [place] on 11/27 8. Cardiomyopathy/chronic combined diastolic congestive heart failure. Monitor for any signs of fluid overload. 9. Hypertension. Coreg 12.5 mg twice a day. Monitor with increased mobility Vitals:   02/24/18 0407 02/24/18 0832  BP: 133/66 139/65  Pulse: 71 69  Resp: 16   Temp: 98.3 F (36.8 C)   SpO2: 96%    Relatively controlled on 11/29 10. COPD/tobacco abuse. Continue inhalers. Provide counseling 11. Hyperlipidemia. Lipitor 12. Acute on chronic anemia.   Hemoglobin 7.8 on 11/29  Labs ordered for Monday  Continue to monitor 13.  Hx gout left great toe does not appear acutely inflammed - tylenol prn, may need foot orthosis if pt has pain with WB 14.  CKD ?Stage II  Creatinine 0.92 on 11/29  Continue to monitor 15.  Hypoalbuminemia  Supplement initiated on 11/25 16.  Hypokalemia  Potassium 3.1 on 11/29  Supplement initiated x3 days  Labs ordered for Monday  LOS: 8 days A FACE TO FACE EVALUATION WAS PERFORMED  Quinlynn Cuthbert [name] 02/24/2018, 8:40 AM

## 2018-02-25 ENCOUNTER — Inpatient Hospital Stay (HOSPITAL_COMMUNITY): Payer: Medicare Other | Admitting: Physical Therapy

## 2018-02-25 ENCOUNTER — Inpatient Hospital Stay (HOSPITAL_COMMUNITY): Payer: Medicare Other | Admitting: Speech Pathology

## 2018-02-25 NOTE — Progress Notes (Signed)
Speech Language Pathology Daily Session Note  Patient Details  Name: Brandy [name] MRN: [id number] Date of Birth: May 17, 1940  Today's Date: 02/25/2018 SLP Individual Time: 1430-1510 SLP Individual Time Calculation (min): 40 min  Short Term Goals: Week 2: SLP Short Term Goal 1 (Week 2): Pt will consume dys 2 textures and thin liquids with supervision cues for use of swallowing precautions and minimal overt s/s of aspiration.   SLP Short Term Goal 2 (Week 2): Pt will sustain her attention to basic, familiar tasks for 5 minutes with Min A cues for redirection.   SLP Short Term Goal 3 (Week 2): Pt will locate items to the left of midline during functional tasks in >50% of opportunities with Mod assist.   SLP Short Term Goal 4 (Week 2): Pt will complete basic, familiar tasks with Mod cues for functional problem solving.   SLP Short Term Goal 5 (Week 2): Pt will recall basic, daily information with Min assist verbal cues.   SLP Short Term Goal 6 (Week 2): Pt will increase vocal intensity to achieve intelligibility at the phrase level with mod assist to achieve > 75% intelligibility.    Skilled Therapeutic Interventions: Skilled treatment session focused on cognitive goals. SLP facilitated session by providing Min A verbal cues for scanning to the left past midline during a basic calendar making task, however, patient required Mod A verbal cues for redirection to task for ~2 minute intervals. Patient also participated in a verbal description task. Patient located the word in her left visual field with Min A verbal cues and independently provided appropriate descriptions of the word. However, patient required Mod A verbal cues to achieve ~75% intelligibility at the sentence level throughout task. Patient left upright in wheelchair with family present. Continue with current plan of care.        Pain Pain Assessment Pain Score: 2   Therapy/Group: Individual Therapy  Corinthian Mizrahi 02/25/2018,  3:58 PM

## 2018-02-25 NOTE — Plan of Care (Signed)
  Problem: Consults Goal: RH STROKE PATIENT EDUCATION Description See Patient Education module for education specifics  Outcome: Progressing   Problem: RH BOWEL ELIMINATION Goal: RH STG MANAGE BOWEL WITH ASSISTANCE Description STG Manage Bowel with min Assistance.  Outcome: Progressing Goal: RH STG MANAGE BOWEL W/MEDICATION W/ASSISTANCE Description STG Manage Bowel with Medication with min Assistance.  Outcome: Progressing   Problem: RH BLADDER ELIMINATION Goal: RH STG MANAGE BLADDER WITH ASSISTANCE Description STG Manage Bladder With min Assistance  Outcome: Progressing Goal: RH STG MANAGE BLADDER WITH MEDICATION WITH ASSISTANCE Description STG Manage Bladder With Medication With min Assistance.  Outcome: Progressing   Problem: RH SKIN INTEGRITY Goal: RH STG SKIN FREE OF INFECTION/BREAKDOWN Outcome: Progressing Goal: RH STG MAINTAIN SKIN INTEGRITY WITH ASSISTANCE Description STG Maintain Skin Integrity With min Assistance.  Outcome: Progressing   Problem: RH SAFETY Goal: RH STG ADHERE TO SAFETY PRECAUTIONS W/ASSISTANCE/DEVICE Description STG Adhere to Safety Precautions With mod Assistance/Device.  Outcome: Progressing Goal: RH STG DECREASED RISK OF FALL WITH ASSISTANCE Description STG Decreased Risk of Fall With min/mod Assistance.  Outcome: Progressing   Problem: RH COGNITION-NURSING Goal: RH STG USES MEMORY AIDS/STRATEGIES W/ASSIST TO PROBLEM SOLVE Description STG Uses Memory Aids/Strategies With min Assistance to Problem Solve.  Outcome: Progressing Goal: RH STG ANTICIPATES NEEDS/CALLS FOR ASSIST W/ASSIST/CUES Description STG Anticipates Needs/Calls for Assist With min Assistance/Cues.  Outcome: Progressing   Problem: RH PAIN MANAGEMENT Goal: RH STG PAIN MANAGED AT OR BELOW [name] PAIN GOAL Outcome: Progressing   Problem: RH KNOWLEDGE DEFICIT Goal: RH STG INCREASE KNOWLEDGE OF HYPERTENSION Outcome: Progressing Goal: RH STG INCREASE KNOWLEDGE OF  DYSPHAGIA/FLUID INTAKE Outcome: Progressing Goal: RH STG INCREASE KNOWLEGDE OF HYPERLIPIDEMIA Outcome: Progressing Goal: RH STG INCREASE KNOWLEDGE OF STROKE PROPHYLAXIS Outcome: Progressing   Problem: RH Vision Goal: RH LTG Vision (Specify) Outcome: Progressing

## 2018-02-25 NOTE — Progress Notes (Signed)
Physical Therapy Weekly Progress Note  Patient Details  Name: Brandy [name] MRN: [id number] Date of Birth: 09/05/1940  Beginning of progress report period: February 17, 2018 End of progress report period: February 25, 2018  Today's Date: 02/25/2018 PT Individual Time: 1300-1400 PT Individual Time Calculation (min): 60 min   Patient has met 0 of 4 short term goals.  Pt has made slower than expected progress over the past week. Pt continues to currently require max +2 to total assist for all transfers. Pt able to maintain static sitting balance in quiet environment with min assist from PT, but requires max-total assist to prevent L LOB with any distractions. [name] pushing tendencies have decreased slightly but still limit weight shifting to the R with dynamic movement in sitting and standing.   Patient continues to demonstrate the following deficits muscle weakness, muscle joint tightness and muscle paralysis, decreased cardiorespiratoy endurance, impaired timing and sequencing, abnormal tone, unbalanced muscle activation, motor apraxia, decreased coordination and decreased motor planning, decreased visual perceptual skills, decreased attention to left, decreased initiation, decreased attention, decreased awareness, decreased problem solving, decreased safety awareness and delayed processing, central origin and decreased sitting balance, decreased standing balance, decreased postural control, hemiplegia and decreased balance strategies and therefore will continue to benefit from skilled PT intervention to increase functional independence with mobility.  Patient not progressing toward long term goals.  See goal revision..  Continue plan of care.  PT Short Term Goals Week 1:  PT Short Term Goal 1 (Week 1): Pt will sit EOB for 3 min with Mod assist  PT Short Term Goal 1 - Progress (Week 1): Progressing toward goal PT Short Term Goal 2 (Week 1): Pt will perform WC<>bed transfer with max assist of 1.   PT Short Term Goal 2 - Progress (Week 1): Progressing toward goal PT Short Term Goal 3 (Week 1): Pt will perform sit<>stand in stedy with max assist  PT Short Term Goal 3 - Progress (Week 1): Progressing toward goal PT Short Term Goal 4 (Week 1): Pt will initiate WC mobility  PT Short Term Goal 4 - Progress (Week 1): Progressing toward goal Week 2:  PT Short Term Goal 1 (Week 2): Pt will perform sit<>stand with max assist  PT Short Term Goal 2 (Week 2): Pt will transfer to [facility] with max assist  PT Short Term Goal 3 (Week 2): Pt will propell WC 119f with mod assist using hemi technique.  PT Short Term Goal 4 (Week 2): Pt will maintain sitting balance wiht min assist while engaged in fine motor task [facility] Skilled [facility]ventions/Progress Updates:   Pt received supine in bed and agreeable to PT. Supine>sit transfer with total assist from PT and max cues for sequencing, attention to the [place] Sitting balance EOB x 5 minutes with mod assist progressing to min assist with max cues for midline orientation and improved cervical positioning. [facility] (pt perfom 20%). transfer completed to the R and pt supported on the R elbow while PT placed SB.   Sit<>stand at rail in hall x 5 with max assist overall. PT required to facilitate trunk and hip extension, improved WB through the LLE by blocking knee and forcing weight shift to the R to limit pushers syndrome. Standing tolerance at rail with mirror feedback following each transfers 20-45 sec with total assist to prevent LOB. Lateral weight shifting to R and return to midline x 6 on last bouts of standing  tolerance.   SB transfer to mat table in rehab gym. Sit<>supine with max assist + 2 for safety.  Rolling R and L to don support sling for lite gait. Max cues for sequencing of roll and improved use of trunk for rolling top the R. Gait training x 89f in lite gait with total assist from PT. PT required advance and  block the LLE and facilitate weight shift over the RLE to overcome pushing tendencies. Pt noted to have steps without WB through the LLE with required max multimodal cues for attention to LLE to facilitate WB through L LE on each step.   Patient returned to room and [facility] [place] in reach and all needs met.         Therapy Documentation Precautions:  Precautions Precautions: Fall Precaution Comments: mild Lt inattention, left hemiplegia Restrictions Weight Bearing Restrictions: No   Pain: Pain Assessment Pain Score: 2    Therapy/Group: Individual Therapy  ALorie Phenix11/30/2019, 3:01 PM

## 2018-02-25 NOTE — Progress Notes (Signed)
  [facility] PHYSICAL MEDICINE & REHABILITATION PROGRESS NOTE   Subjective/Complaints: No complaints.  She is feeling well.  Objective: Physical Exam: Vital Signs Blood pressure (!) 142/74, pulse 98, temperature 99.2 F (37.3 C), temperature source Oral, resp. rate 18, height 5\' 4"  (1.626 m), weight 85.7 kg, SpO2 100 %.   Well-developed well-nourished female in no acute distress. HEENT exam atraumatic, normocephalic, extraocular muscles are intact. Neck is supple. No jugular venous distention no thyromegaly. Chest clear to auscultation without increased work of breathing. Cardiac exam S1 and S2 are regular. Abdominal exam active bowel sounds, soft, nontender. Extremities with edema of left hand and foot. Neurologic exam she is alert withDYSARTHRIA  Assessment/Plan: 1. Functional deficits secondary to right MCA infarction with right M1 occlusion status post minimal recannulization after mechanical thrombectomy as well as history of left MCA infarction April 2019.   Medical Problem List and Plan: 1.Left side weaknesssecondary to right MCA infarction with right M1 occlusion status post minimal recannulization after mechanical thrombectomy as well as history of left MCA infarction April 2019. Advise 30 day cardiac monitor on discharge  Continue CIR  PRAFO/WHO nightly 2. DVT Prophylaxis/Anticoagulation: Subcutaneous Lovenox. 3. Pain Management:Tylenol as needed 4. Mood:Aricept 5 mg daily 5. Neuropsych: This patienthe iscapable of making decisions on herown behalf. 6. Skin/Wound Care:Routine skin checks 7. Fluids/Electrolytes/Nutrition:Routine ins and outs  IVF [place] on 11/27 8. Cardiomyopathy/chronic combined diastolic congestive heart failure. Monitor for any signs of fluid overload. 9. Hypertension. Adequate control Vitals:   02/24/18 2003 02/25/18 0424  BP: 137/62 (!) 142/74  Pulse: 73 98  Resp: 18   Temp: 99.2 F (37.3 C)   SpO2: 100% 100%    10. COPD/tobacco  abuse. Continue inhalers. Provide counseling 11. Hyperlipidemia. Lipitor 12. Acute on chronic anemia.    Lab Results  Component Value Date   HGB 7.8 (L) 02/24/2018  Stable hemoglobin. Repeat labs 12/2 13.  Hx gout left great toe no s/s currently   15.  Hypoalbuminemia  Supplement initiated on 11/25 16.  Hypokalemia  Will recheck on 02/27/2018.  LOS: 9 days A FACE TO FACE EVALUATION WAS PERFORMED   H  02/25/2018, 6:26 AM

## 2018-02-25 NOTE — Progress Notes (Signed)
Occupational Therapy Weekly Progress Note  Patient Details  Name: Brandy [name] MRN: [id number] Date of Birth: 09/04/1940  Beginning of progress report period: February 17, 2018 End of progress report period: February 25, 2018    Patient has met 3 of 5 short term goals.  Brandy [name] is continuing to make stedy progress with OT at this time.  Currently, she is able to exhibit static sitting balance EOC or EOM with overall min to mod assist.  Dynamic sitting balance is still at a max assist level overall with increased LOB to the left and forward secondary to pusher tendencies.  She needs overall min assist for UB bathing in supported sitting with total assist for UB dressing in the same position.  Total assist is also needed for LB bathing and dressing in supine to sit position.  Total +2 (pt 25%) is needed to complete sit to partial stand at this time.  Brandy [name] continues to demonstrate sensory and movement impairment in the LUE, with current active movement at a Brunnstrum stage I movement in the arm and leg.  She is unable to detect light touch or deep pressure anywhere throughout the LUE.  Left neglect is also present with Brandy [name] demonstrating head turn to the right with gaze to the right.  She is able to turn her head and spontaneously locate objects or people on the left side, but only when cued to do so.  Overall she is demonstrating progress, however based on significance of her CVA, feel progress with likely be slower.  Recommend continued CIR to progress to mod to max assist level goals.  Unsure if spouse and family can provide assist at this current level.  Pt may need SNF for follow-up for extended rehab if assist can not be arranged.     Patient continues to demonstrate the following deficits: muscle weakness, impaired timing and sequencing, unbalanced muscle activation and decreased coordination, decreased midline orientation, decreased attention to left and left side neglect, decreased  attention, decreased awareness, decreased problem solving and delayed processing and decreased sitting balance, decreased standing balance, decreased postural control, hemiplegia and decreased balance strategies and therefore will continue to benefit from skilled OT intervention to enhance overall performance with BADL and Reduce care partner burden.  Patient progressing toward long term goals..  Continue plan of care.  OT Short Term Goals Week 2:  OT Short Term Goal 1 (Week 2): Pt will complete sliding board transfer from bed to wheelchair with max assist.   OT Short Term Goal 2 (Week 2): Pt will maintain static sitting balance EOB or EOM with min assist in preparation for selfcare tasks.  OT Short Term Goal 3 (Week 2): Pt will maintain dynamic sitting balance during UB bathing with mod assist.  OT Short Term Goal 4 (Week 2): Pt will complete sit to stand with total assist +2 (pt 35%) during LB selfcare tasks.  OT Short Term Goal 5 (Week 2): Pt will perform toilet transfer with total assist +2 (pt 25%) using the Stedy.   Therapy Documentation Precautions:  Precautions Precautions: Fall Precaution Comments: mild Lt inattention, left hemiplegia Restrictions Weight Bearing Restrictions: No  Therapy/Group: Individual Therapy  Avya Flavell OTR/L 02/25/2018, 10:53 PM

## 2018-02-26 ENCOUNTER — Inpatient Hospital Stay (HOSPITAL_COMMUNITY): Payer: Medicare Other | Admitting: Physical Therapy

## 2018-02-26 NOTE — Progress Notes (Signed)
  Brandy [name] PHYSICAL MEDICINE & REHABILITATION PROGRESS NOTE   Subjective/Complaints: No complaints, sitting up in bed.   Objective: Physical Exam: Vital Signs Blood pressure 127/75, pulse 77, temperature 98.6 F (37 C), temperature source Oral, resp. rate 18, height 5\' 4"  (1.626 m), weight 85.7 kg, SpO2 97 %.   Well-developed well-nourished female in no acute distress. HEENT exam atraumatic, normocephalic, extraocular muscles are intact. Neck is supple. No jugular venous distention no thyromegaly. Chest clear to auscultation without increased work of breathing. Cardiac exam S1 and S2 are regular. Abdominal exam active bowel sounds, soft, nontender. Extremities no edema. Neurologic exam she is alert ith dysarthra. .  Assessment/Plan: 1. Functional deficits secondary to right MCA infarction with right M1 occlusion status post minimal recannulization after mechanical thrombectomy as well as history of left MCA infarction April 2019.   Medical Problem List and Plan: 1.Left side weaknesssecondary to right MCA infarction with right M1 occlusion status post minimal recannulization after mechanical thrombectomy as well as history of left MCA infarction April 2019. Advise 30 day cardiac monitor on discharge  Inpatient rehab.  2. DVT Prophylaxis/Anticoagulation: Subcutaneous Lovenox. 3. Pain Management:Tylenol as needed 4. Mood/sleepAricept 5 mg daily 5. Neuropsych: This patienthe iscapable of making decisions on herown behalf. 6. Skin/Wound Care:Routine skin checks 7. Fluids/Electrolytes/Nutrition: Stable- needs repeat K Basic Metabolic Panel:    Component Value Date/Time   NA 142 02/24/2018 0638   K 3.1 (L) 02/24/2018 0638   CL 113 (H) 02/24/2018 0638   CO2 24 02/24/2018 0638   BUN 17 02/24/2018 0638   CREATININE 0.92 02/24/2018 0638   CREATININE 1.27 (H) 08/04/2017 1259   GLUCOSE 84 02/24/2018 0638   CALCIUM 8.6 (L) 02/24/2018 0638    8. Cardiomyopathy/chronic  combined diastolic congestive heart failure. Monitor for any signs of fluid overload. 9. Hypertension. controoled on current regimen Vitals:   02/25/18 1946 02/26/18 0406  BP: 123/79 127/75  Pulse: (!) 58 77  Resp: 18 18  Temp: 97.6 F (36.4 C) 98.6 F (37 C)  SpO2: 100% 97%    10. COPD/tobacco abuse. discontinue 11. Hyperlipidemia. Lipitor 12. Acute on chronic anemia.    Lab Results  Component Value Date   HGB 7.8 (L) 02/24/2018  repeat hgb 12/2 13.  Hx gout left great toe no s/s currently   15.  Hypoalbuminemia  Supplement initiated on 11/25 16.  Hypokalemia  Will recheck on 02/27/2018.  LOS: 10 days A FACE TO FACE EVALUATION WAS PERFORMED  Brandy [name] H Clancy Mullarkey 02/26/2018, 7:02 AM

## 2018-02-26 NOTE — Progress Notes (Signed)
Physical Therapy Session Note  Patient Details  Name: Brandy [name] MRN: [id number] Date of Birth: 07/28/1940  Today's Date: 02/26/2018 PT Individual Time: 0900-0950 PT Individual Time Calculation (min): 50 min   Short Term Goals: Week 2:  PT Short Term Goal 1 (Week 2): Pt will perform sit<>stand with max assist  PT Short Term Goal 2 (Week 2): Pt will transfer to [facility] with max assist  PT Short Term Goal 3 (Week 2): Pt will propell WC 177f with mod assist using hemi technique.  PT Short Term Goal 4 (Week 2): Pt will maintain sitting balance wiht min assist while [facility]/Progress Updates:   Pt in supine and agreeable to therapy, denies pain. Total assist to transfer to EOB w/ verbal and tactile cues for technique and to minimize pushing when using RUE to boost up. Worked on maintaining static sitting w/o back support, mod-max assist while waiting on 2nd helper to assist w/ transfer to w/c. Verbal and tactile cues for RUE placement to minimize L lateral lean, tactile cues for posture. SB transfer to TIS, total assist x2, 2nd helper stabilizing board and w/c. Verbal and tactile cues throughout transfer for technique and to utilize RUE to assist. Total assist w/c transport to/from therapy gym, performed sit<>stand to standing frame x1. Upon standing, noted that pt's pants and brief were soiled from BM. Returned to room and performed SB transfer to/from bed w/ same assist as detailed above. Worked on rolling technique while rolling in both directions w/ max assist and verbal/manual cues for technique. 2nd helper assisting w/ pericare, pt needed mod assist to stay in sidelying. Total assist to don LE garments, max assist to don new shirt once seated back in w/c, verbal cues for technique. Ended session tilted in TIS and in care of daughter, all needs met.   Therapy Documentation Precautions:  Precautions Precautions: Fall Precaution  Comments: mild Lt inattention, left hemiplegia Restrictions Weight Bearing Restrictions: No Vital Signs: Oxygen Therapy SpO2: 99 % O2 Device: Room Air Pain: Pain Assessment Pain Scale: 0-10 Pain Score: 0-No pain  Therapy/Group: Individual Therapy  Mohamad Bruso KClent Demark12/03/2017, 9:54 AM

## 2018-02-27 ENCOUNTER — Inpatient Hospital Stay (HOSPITAL_COMMUNITY): Payer: Medicare Other | Admitting: Occupational Therapy

## 2018-02-27 ENCOUNTER — Inpatient Hospital Stay (HOSPITAL_COMMUNITY): Payer: Medicare Other

## 2018-02-27 ENCOUNTER — Inpatient Hospital Stay (HOSPITAL_COMMUNITY): Payer: Medicare Other | Admitting: Physical Therapy

## 2018-02-27 LAB — CBC WITH DIFFERENTIAL/PLATELET
Abs Immature Granulocytes: 0.03 10*3/uL (ref 0.00–0.07)
BASOS PCT: 1 %
Basophils Absolute: 0 10*3/uL (ref 0.0–0.1)
Eosinophils Absolute: 0.2 10*3/uL (ref 0.0–0.5)
Eosinophils Relative: 3 %
HCT: 27.4 % — ABNORMAL LOW (ref 36.0–46.0)
Hemoglobin: 8.5 g/dL — ABNORMAL LOW (ref 12.0–15.0)
IMMATURE GRANULOCYTES: 1 %
LYMPHS PCT: 20 %
Lymphs Abs: 1.1 10*3/uL (ref 0.7–4.0)
MCH: 28.2 pg (ref 26.0–34.0)
MCHC: 31 g/dL (ref 30.0–36.0)
MCV: 91 fL (ref 80.0–100.0)
MONO ABS: 0.5 10*3/uL (ref 0.1–1.0)
MONOS PCT: 8 %
NEUTROS PCT: 67 %
Neutro Abs: 3.6 10*3/uL (ref 1.7–7.7)
PLATELETS: 428 10*3/uL — AB (ref 150–400)
RBC: 3.01 MIL/uL — ABNORMAL LOW (ref 3.87–5.11)
RDW: 14.5 % (ref 11.5–15.5)
WBC: 5.4 10*3/uL (ref 4.0–10.5)
nRBC: 0 % (ref 0.0–0.2)

## 2018-02-27 LAB — BASIC METABOLIC PANEL
ANION GAP: 4 — AB (ref 5–15)
BUN: 19 mg/dL (ref 8–23)
CHLORIDE: 114 mmol/L — AB (ref 98–111)
CO2: 26 mmol/L (ref 22–32)
Calcium: 8.8 mg/dL — ABNORMAL LOW (ref 8.9–10.3)
Creatinine, Ser: 0.9 mg/dL (ref 0.44–1.00)
GFR calc Af Amer: >60 mL/min (ref >60)
GFR calc non Af Amer: >60 mL/min (ref >60)
GLUCOSE: 94 mg/dL (ref 70–99)
POTASSIUM: 4 mmol/L (ref 3.5–5.1)
Sodium: 144 mmol/L (ref 135–145)

## 2018-02-27 NOTE — Progress Notes (Signed)
Physical Therapy Session Note  Patient Details  Name: Brandy [name] MRN: [id number] Date of Birth: January 04, 1941  Today's Date: 02/27/2018 PT Individual Time: 1000-1055 and 1301-1344 PT Individual Time Calculation (min): 55 min and 79mn  Short Term Goals: Week 2:  PT Short Term Goal 1 (Week 2): Pt will perform sit<>stand with max assist  PT Short Term Goal 2 (Week 2): [facility]  PT Short Term Goal 3 (Week 2): Pt will propell WC 1079fwith mod assist using hemi technique.  PT Short Term Goal 4 (Week 2): Pt will maintain sitting balance wiht min assist while engaged in fine motor task at [facility]/Progress Updates: Tx1: Pt presented in TIS agreeable to therapy. Session focused on standing tolerance and use of Lite Gait. Pt transported and set up with [employer] TIEastman Kodakhair. Pt required mod/max multimodal cues for midline and wt shifting. Participated in Lite Gait activity xxx ft with max multimodal cues for R quad activation, wt shifting to R, wt bearing through L, and upright posture. Pt was able to initiate step with RLE and noted to improve stance phase after several steps. At end of session nsg request pt to return to bed for bladder scan. Pt performed SB transfer to R maxA x 2 and was maxA x 2 sit to supine at EOB. Pt was noted while sitting at EOB to have improved midline posture with decreased cues. Pt left in bed at end of session with nsg present.   Tx2: Pt presented in bed agreeable to therapy. Pt performed supine to sit maxA with cues for reaching for hand rail and tactile cues for increased abdominal activation. Pt participated sitting EOB attaining and maintaining midline. Pt initially requiring maxA fading to mod with max multimodal cues to decrease L lateral lean. Participated in active pushing to R into PTA for R trunk activation. PTA performed L SB transfer to TIS chair maxA x 2. Pt transported to rehab gym and participated in  standing frame activity to improve standing tolerance and to engage trunk for upright control. Pt able to tolerate x 2 bouts in standing frame with PTA providing max verbal cues for increasing R lean for midline. Pt unable to maintain midline for approx >30 sec before initiating L lateral lean. After final bout pt repositioned in TIValley Brook[place] for comfort and returned to room. Pt left in chair with husband present and current needs met.      Therapy Documentation Precautions:  Precautions Precautions: Fall Precaution Comments: mild Lt inattention, left hemiplegia Restrictions Weight Bearing Restrictions: No General:   Vital Signs: Therapy Vitals Pulse Rate: 67 BP: (!) 142/68 Pain: Pain Assessment Pain Scale: 0-10 Pain Score: 0-No pain   Therapy/Group: Individual Therapy  Brandy Tillman  Latashia [name], PTA  02/27/2018, 11:25 AM

## 2018-02-27 NOTE — Progress Notes (Signed)
Speech Language Pathology Daily Session Note  Patient Details  Name: Brandy [name] MRN: [id number] Date of Birth: 1941-01-14  Today's Date: 02/27/2018 SLP Individual Time: 1130-1200 SLP Individual Time Calculation (min): 30 min  Short Term Goals: Week 2: SLP Short Term Goal 1 (Week 2): Pt will consume dys 2 textures and thin liquids with supervision cues for use of swallowing precautions and minimal overt s/s of aspiration.   SLP Short Term Goal 2 (Week 2): Pt will sustain her attention to basic, familiar tasks for 5 minutes with Min A cues for redirection.   SLP Short Term Goal 3 (Week 2): Pt will locate items to the left of midline during functional tasks in >50% of opportunities with Mod assist.   SLP Short Term Goal 4 (Week 2): Pt will complete basic, familiar tasks with Mod cues for functional problem solving.   SLP Short Term Goal 5 (Week 2): Pt will recall basic, daily information with Min assist verbal cues.   SLP Short Term Goal 6 (Week 2): Pt will increase vocal intensity to achieve intelligibility at the phrase level with mod assist to achieve > 75% intelligibility.    Skilled Therapeutic Interventions:Skilled ST services focused on swallow and speech skills. SLP facilitated PO consumption of dys 2 and thin via provale cup lunch tray, pt required supervision A verbal cues for swallow strategies and pt required mod A verbal cues to continue PO consumption due to attention deficits. Pt demonstrated mild left anterior spillage. Pt required mod-min A verbal cues for sustained attention in 5 minutes intervals. Pt demonstrated increased speech intelligibility at phrase level, 80% intelligibility with min A verbal cues. Pt requested information about discharge date, SLP educated pt on possible SNF placement and pt agreed "seems reasonable." SLP educated pt's husbands on appropriate cuing during PO consumption, however requires further education to be signed off to supervision meals,  demonstrated little interest at this time. Pt was left in room with call [place] within reach and bed/chair alarm set. SLP reccomends to continue skilled services.     Pain Pain Assessment Pain Scale: Faces Pain Score: 0-No pain  Therapy/Group: Individual Therapy  Jareb Radoncic  [facility] 02/27/2018, 12:44 PM

## 2018-02-27 NOTE — Progress Notes (Signed)
Occupational Therapy Session Note  Patient Details  Name: Brandy [name] MRN: [id number] Date of Birth: 01-02-1941  Today's Date: 02/27/2018 OT Individual Time: 0900-1000 OT Individual Time Calculation (min): 60 min    Short Term Goals: Week 2:  OT Short Term Goal 1 (Week 2): Pt will complete sliding board transfer from bed to wheelchair with max assist.   OT Short Term Goal 2 (Week 2): Pt will maintain static sitting balance EOB or EOM with min assist in preparation for selfcare tasks.  OT Short Term Goal 3 (Week 2): Pt will maintain dynamic sitting balance during UB bathing with mod assist.  OT Short Term Goal 4 (Week 2): Pt will complete sit to stand with total assist +2 (pt 35%) during LB selfcare tasks.  OT Short Term Goal 5 (Week 2): Pt will perform toilet transfer with total assist +2 (pt 25%) using the Stedy.   Skilled Therapeutic Interventions/Progress Updates:    Pt completed bathing and dressing sit to stand EOB during session.  Max assist for supine to sit with max assist for dynamic sitting balance during all selfcare tasks.  She still exhibits decreased sitting posture, maintaining posterior pelvic tilt with left trunk elongation and thoracic rounding as well as cervical flexion and head rotation to the right.  Max assist to achieve and maintain upright midline head and trunk posture.  Pt with severe motor planning and perceptual deficits demonstrating pusher tendencies to the left and inability to activate her left trunk or the UE/LE during task.  Total assist +2 (pt 20%) for sit to stand during peri washing and for pulling brief and pants over hips.  Total assist for donning pullover shhirt, threading her pants, donning TEDs, and donning gripper socks as well. Sliding board utilized for transfer from bed to wheelchair with total assist +2 (pt 30%).  Finished with pt up in the tilt in space wheelchair with safety belt in place and call button and phone in reach.    Therapy  Documentation Precautions:  Precautions Precautions: Fall Precaution Comments: mild Lt inattention, left hemiplegia Restrictions Weight Bearing Restrictions: No  Pain: Pain Assessment Pain Scale: Faces Pain Score: 0-No pain ADL: Therapy/Group: Individual Therapy  Brandy [name] OTR/L 02/27/2018, 12:11 PM

## 2018-02-27 NOTE — Progress Notes (Signed)
[name] PHYSICAL MEDICINE & REHABILITATION PROGRESS NOTE   Subjective/Complaints: Patient seen laying in bed this morning.  She indicates she slept well overnight.  She indicates she had a good weekend.  Husband at bedside has questions about patient's progress.  ROS: Denies CP, shortness of breath, nausea, vomiting, diarrhea.  Objective:   No results found. No results for input(s): WBC, HGB, HCT, PLT in the last 72 hours. No results for input(s): NA, K, CL, CO2, GLUCOSE, BUN, CREATININE, CALCIUM in the last 72 hours.  Intake/Output Summary (Last 24 hours) at 02/27/2018 0817 Last data filed at 02/27/2018 0500 Gross per 24 hour  Intake 450 ml  Output -  Net 450 ml     Physical Exam: Vital Signs Blood pressure 134/85, pulse 74, temperature 98.3 F (36.8 C), temperature source Oral, resp. rate 20, height 5\' 4"  (1.626 m), weight 85.7 kg, SpO2 96 %.  Constitutional: No distress . Vital signs reviewed. HENT: Normocephalic.  Atraumatic. Eyes: EOMI. No discharge. Cardiovascular: RRR.  No JVD. Respiratory: CTA bilaterally.  Normal effort. GI: BS +. Non-distended. Musc: Edema left hand and foot. Neurological: Alert. Dysarthria Follows commands. Left inattention Sensation absent to light touch left upper and lower extremity Motor: LUE/LLE: 0/5 proximal to distal, stable Right upper extremity: 5/5 proximal distal Right lower extremity: ?2/5 hip flexion, knee extension, 4+/5 ankle dorsiflexion Skin: Skin iswarmand dry.  Assessment/Plan: 1. Functional deficits secondary to right MCA infarction with right M1 occlusion status post minimal recannulization after mechanical thrombectomy as well as history of left MCA infarction April 2019. which require 3+ hours per day of interdisciplinary therapy in a comprehensive inpatient rehab setting.  Physiatrist is providing close team supervision and 24 hour management of active medical problems listed below.  Physiatrist and rehab  team continue to assess barriers to discharge/monitor patient progress toward functional and medical goals  Care Tool:  Bathing    Body parts bathed by patient: Left arm, Chest, Abdomen, Front perineal area, Face   Body parts bathed by helper: Right arm, Right upper leg, Left lower leg, Right lower leg, Left upper leg     Bathing assist Assist Level: 2 Helpers     Upper Body Dressing/Undressing Upper body dressing   What is the patient wearing?: Pull over shirt    Upper body assist Assist Level: Total Assistance - Patient < 25%    Lower Body Dressing/Undressing Lower body dressing      What is the patient wearing?: Pants     Lower body assist Assist for lower body dressing: 2 Helpers     Toileting Toileting    Toileting assist Assist for toileting: 2 Helpers     Transfers Chair/bed transfer  Transfers assist     Chair/bed transfer assist level: 2 Helpers     Locomotion Ambulation   Ambulation assist   Ambulation activity did not occur: Safety/medical concerns  Assist level: Dependent - Patient 0% Assistive device: Lite Gait Max distance: 19ft    Walk 10 feet activity   Assist  Walk 10 feet activity did not occur: Safety/medical concerns  Assist level: Dependent - Patient 0% Assistive device: Lite Gait   Walk 50 feet activity   Assist Walk 50 feet with 2 turns activity did not occur: Safety/medical concerns         Walk 150 feet activity   Assist Walk 150 feet activity did not occur: Safety/medical concerns         Walk 10 feet on uneven surface  activity  Assist Walk 10 feet on uneven surfaces activity did not occur: Safety/medical concerns         Wheelchair     Assist   Type of Wheelchair: Manual    Wheelchair assist level: Dependent - Patient 0% Max wheelchair distance: 158ft    Wheelchair 50 feet with 2 turns activity    Assist        Assist Level: Dependent - Patient 0%   Wheelchair 150 feet  activity     Assist Wheelchair 150 feet activity did not occur: Safety/medical concerns        Medical Problem List and Plan: 1.Left side weaknesssecondary to right MCA infarction with right M1 occlusion status post minimal recannulization after mechanical thrombectomy as well as history of left MCA infarction April 2019. Advise 30 day cardiac monitor on discharge  Continue CIR  PRAFO/WHO nightly  Weekend notes reviewed 2. DVT Prophylaxis/Anticoagulation: Subcutaneous Lovenox. 3. Pain Management:Tylenol as needed 4. Mood:Aricept 5 mg daily 5. Neuropsych: This patienthe iscapable of making decisions on herown behalf. 6. Skin/Wound Care:Routine skin checks 7. Fluids/Electrolytes/Nutrition:Routine ins and outs  IVF [place] on 11/27 8. Cardiomyopathy/chronic combined diastolic congestive heart failure. Monitor for any signs of fluid overload. 9. Hypertension. Coreg 12.5 mg twice a day. Monitor with increased mobility Vitals:   02/27/18 0448 02/27/18 0530  BP: 134/85   Pulse: (!) 103 74  Resp: 20   Temp: 98.3 F (36.8 C)   SpO2: 96%    Relatively controlled on 12/2 10. COPD/tobacco abuse. Continue inhalers. Provide counseling 11. Hyperlipidemia. Lipitor 12. Acute on chronic anemia.   Hemoglobin 7.8 on 11/29  Labs pending  Continue to monitor 13.  Hx gout left great toe does not appear acutely inflammed - tylenol prn, may need foot orthosis if pt has pain with WB 14.  CKD ?Stage II  Creatinine 0.92 on 11/29  Continue to monitor 15.  Hypoalbuminemia  Supplement initiated on 11/25 16.  Hypokalemia  Potassium 3.1 on 11/29  Supplement initiated x3 days  Labs pending  LOS: 11 days A FACE TO FACE EVALUATION WAS PERFORMED  Brandy [name] Brandy [name] 02/27/2018, 8:17 AM

## 2018-02-28 ENCOUNTER — Inpatient Hospital Stay (HOSPITAL_COMMUNITY): Payer: Medicare Other | Admitting: Occupational Therapy

## 2018-02-28 ENCOUNTER — Inpatient Hospital Stay (HOSPITAL_COMMUNITY): Payer: Medicare Other

## 2018-02-28 DIAGNOSIS — G8194 Hemiplegia, unspecified affecting left nondominant side: Secondary | ICD-10-CM

## 2018-02-28 NOTE — Progress Notes (Signed)
Occupational Therapy Session Note  Patient Details  Name: Brandy [name] MRN: [id number] Date of Birth: 1941-03-26  Today's Date: 02/28/2018 OT Individual Time: 7510-2585 OT Individual Time Calculation (min): 73 min    Short Term Goals: Week 2:  OT Short Term Goal 1 (Week 2): Pt will complete sliding board transfer from bed to wheelchair with max assist.   OT Short Term Goal 2 (Week 2): Pt will maintain static sitting balance EOB or EOM with min assist in preparation for selfcare tasks.  OT Short Term Goal 3 (Week 2): Pt will maintain dynamic sitting balance during UB bathing with mod assist.  OT Short Term Goal 4 (Week 2): Pt will complete sit to stand with total assist +2 (pt 35%) during LB selfcare tasks.  OT Short Term Goal 5 (Week 2): Pt will perform toilet transfer with total assist +2 (pt 25%) using the [place].   Skilled Therapeutic Interventions/Progress Updates:    Pt completed bathing and dressing sit to stand from the EOB during session.  Max assist for transfer to the EOB on the right side.  She needed max assist for static sitting balance as well as dynamic standing balance with bathing tasks.  Incorporated functional reaching to the right laterally to retrieve items from bedside table, in order to decrease pushing to the left side.  Max demonstrational cueing for anterior pelvic tilt and cervical extension initiation.  Pt with motor impersistence and not being able to sustain positions of extension or neutral once achieved.  Total +2 (pt 25%) for sit to stand and standing when washing peri area and buttocks.  Used three muskateers technique for sit to stand when pulling pants over hips.  Mod questioning cueing with max assist to donn pullover shirt and pants following hemidressing techniques as well.  Pt exhibits decreased ability to unweight the RLE for placing it into a garment unless she is stabilizing with her RUE.  Completed transfer to the wheelchair with total assist stand pivot  three muskateers to complete session.  Max assist for brushing hair secondary to decreased thoroughness.  Pt left with safety belt in place and call button and phone in reach.  Spouse present and observed throughout session.   Therapy Documentation Precautions:  Precautions Precautions: Fall Precaution Comments: mild Lt inattention, left hemiplegia, pusher to the left, motor impersistence Restrictions Weight Bearing Restrictions: No  Pain: Pain Assessment Pain Scale: Faces Pain Score: 0-No pain ADL: See Care Tool Section of chart for details  Therapy/Group: Individual Therapy  Braylynn Ghan OTR/L 02/28/2018, 12:29 PM

## 2018-02-28 NOTE — Progress Notes (Signed)
Speech Language Pathology Daily Session Note  Patient Details  Name: Brandy [name] MRN: [id number] Date of Birth: 16-Nov-1940  Today's Date: 02/28/2018 SLP Individual Time: 1300-1400 SLP Individual Time Calculation (min): 60 min  Short Term Goals: Week 2: SLP Short Term Goal 1 (Week 2): Pt will consume dys 2 textures and thin liquids with supervision cues for use of swallowing precautions and minimal overt s/s of aspiration.   SLP Short Term Goal 2 (Week 2): Pt will sustain her attention to basic, familiar tasks for 5 minutes with Min A cues for redirection.   SLP Short Term Goal 3 (Week 2): Pt will locate items to the left of midline during functional tasks in >50% of opportunities with Mod assist.   SLP Short Term Goal 4 (Week 2): Pt will complete basic, familiar tasks with Mod cues for functional problem solving.   SLP Short Term Goal 5 (Week 2): Pt will recall basic, daily information with Min assist verbal cues.   SLP Short Term Goal 6 (Week 2): Pt will increase vocal intensity to achieve intelligibility at the phrase level with mod assist to achieve > 75% intelligibility.    Skilled Therapeutic Interventions:Skilled ST services focused on swallow, cognitive skills and education. SLP facilitated PO consumption of is dys 2 and thin via provale cup, pt required max-mod A verbal cues to continue PO consumption. [name] husband provided cues to continue PO consumption and assess for pocketing, SLP signed pt's husband off to provided supervision, all questions answered to satisfaction. SLP facilitated PO consumption of thin liquid via cup not provale due to increase oral control and reduced anterior spillage, pt demonstrated no overt s/s aspiration and min anteior loss. SLP upgraded thin liquids to regular cup, informing nurse upon leaving. Pt was left in room with call [place] within reach, husband in room and chair alarm set. SLP reccomends to continue skilled services.     Pain Pain  Assessment Pain Scale: Faces Pain Score: 0-No pain  Therapy/Group: Individual Therapy  Roena Sassaman  [facility] 02/28/2018, 2:03 PM

## 2018-02-28 NOTE — Progress Notes (Signed)
Physical Therapy Session Note  Patient Details  Name: Brandy [name] MRN: [id number] Date of Birth: 1940-04-30  Today's Date: 02/28/2018 PT Individual Time: 1102-1205 PT Individual Time Calculation (min): 63 min   Short Term Goals: Week 2:  PT Short Term Goal 1 (Week 2): Pt will perform sit<>stand with max assist  PT Short Term Goal 2 (Week 2): Pt will transfer to [facility] with max assist  PT Short Term Goal 3 (Week 2): Pt will propell WC 170ft with mod assist using hemi technique.  PT Short Term Goal 4 (Week 2): Pt will maintain sitting balance wiht min assist while engaged in fine motor task at [facility]  Skilled Therapeutic Interventions/Progress Updates: Pt presented in TIS chair agreeable to therapy. Session focused on sitting balance and midline orientation. Pt transported to rehab gym and performed squat pivot max/total A to mat. At mat pat participated in reaching activities wth emphasis of reaching forward to R (pt able to lean against wall or rest on windowsill on R side). Pt required mod cues to initiate lean to R which improved to min cues during tx session. Pt required mod A to return to upright position after forward reach with PTA providing manual facilitation of activation of R obliques. With fatigue pt noted to have decreased initiation reaching for objects requiring intermittent assist for increased reach to R against wall. During rest breaks pt required mod multimodal cues for maintaining midline. Red wedge placed under L pelvis to encourage level pelvis while pt played keyboard. During keyboard activity mirror placed for visual feedback to encourage forward gaze and postural correction. Prior to pt returning to TIS, towel roll placed under chair cushion then pt performed squat pivot transfer maxA x 2 to return to chair. Pt transported back to room, belt alarm placed, and left with call [place] within reach and husband present.      Therapy Documentation Precautions:   Precautions Precautions: Fall Precaution Comments: mild Lt inattention, left hemiplegia Restrictions Weight Bearing Restrictions: No General:   Vital Signs: Therapy Vitals Pulse Rate: 63 BP: (!) 141/66 Oxygen Therapy SpO2: 98 % O2 Device: Room Air Pain: Pain Assessment Pain Scale: 0-10 Pain Score: 3    Therapy/Group: Individual Therapy  Kourtni Stineman  Bryse Blanchette, PTA  02/28/2018, 12:15 PM

## 2018-02-28 NOTE — Progress Notes (Signed)
Brandy [name] PHYSICAL MEDICINE & REHABILITATION PROGRESS NOTE   Subjective/Complaints: Patient seen laying in bed this morning.  She states she slept well overnight.  She denies complaints.  ROS: Denies CP, shortness of breath, nausea, vomiting, diarrhea.  Objective:   No results found. Recent Labs    02/27/18 0828  WBC 5.4  HGB 8.5*  HCT 27.4*  PLT 428*   Recent Labs    02/27/18 0828  NA 144  K 4.0  CL 114*  CO2 26  GLUCOSE 94  BUN 19  CREATININE 0.90  CALCIUM 8.8*    Intake/Output Summary (Last 24 hours) at 02/28/2018 0826 Last data filed at 02/27/2018 2000 Gross per 24 hour  Intake 180 ml  Output -  Net 180 ml     Physical Exam: Vital Signs Blood pressure (!) 145/76, pulse 81, temperature 97.8 F (36.6 C), temperature source Oral, resp. rate 16, height 5\' 4"  (1.626 m), weight 85.7 kg, SpO2 100 %.  Constitutional: No distress . Vital signs reviewed. HENT: Normocephalic.  Atraumatic. Eyes: EOMI. No discharge. Cardiovascular: RRR.  No JVD. Respiratory: CTA bilaterally.  Normal effort. GI: BS +. Non-distended. Musc: Edema left hand and foot. Neurological: Alert. Dysarthria Follows commands. Left inattention Sensation absent to light touch left upper and lower extremity Motor: LUE/LLE: 0/5 proximal to distal, stable Right upper extremity: 5/5 proximal distal Right lower extremity: ?2/5 hip flexion, knee extension, 4+/5 ankle dorsiflexion Skin: Skin iswarmand dry.  Assessment/Plan: 1. Functional deficits secondary to right MCA infarction with right M1 occlusion status post minimal recannulization after mechanical thrombectomy as well as history of left MCA infarction April 2019. which require 3+ hours per day of interdisciplinary therapy in a comprehensive inpatient rehab setting.  Physiatrist is providing close team supervision and 24 hour management of active medical problems listed below.  Physiatrist and rehab team continue to assess barriers to  discharge/monitor patient progress toward functional and medical goals  Care Tool:  Bathing    Body parts bathed by patient: Left arm, Chest, Abdomen, Right upper leg, Left upper leg, Face   Body parts bathed by helper: Right lower leg, Left lower leg, Front perineal area, Buttocks, Right arm     Bathing assist Assist Level: 2 Helpers     Upper Body Dressing/Undressing Upper body dressing   What is the patient wearing?: Pull over shirt    Upper body assist Assist Level: Total Assistance - Patient < 25%    Lower Body Dressing/Undressing Lower body dressing      What is the patient wearing?: Pants     Lower body assist Assist for lower body dressing: 2 Helpers     Toileting Toileting    Toileting assist Assist for toileting: 2 Helpers     Transfers Chair/bed transfer  Transfers assist     Chair/bed transfer assist level: 2 Helpers(sliding board)     Locomotion Ambulation   Ambulation assist   Ambulation activity did not occur: Safety/medical concerns  Assist level: Dependent - Patient 0% Assistive device: Lite Gait Max distance: 20ft    Walk 10 feet activity   Assist  Walk 10 feet activity did not occur: Safety/medical concerns  Assist level: Dependent - Patient 0% Assistive device: Lite Gait   Walk 50 feet activity   Assist Walk 50 feet with 2 turns activity did not occur: Safety/medical concerns         Walk 150 feet activity   Assist Walk 150 feet activity did not occur: Safety/medical concerns  Walk 10 feet on uneven surface  activity   Assist Walk 10 feet on uneven surfaces activity did not occur: Safety/medical concerns         Wheelchair     Assist   Type of Wheelchair: Manual    Wheelchair assist level: Dependent - Patient 0% Max wheelchair distance: 118ft    Wheelchair 50 feet with 2 turns activity    Assist        Assist Level: Dependent - Patient 0%   Wheelchair 150 feet activity      Assist Wheelchair 150 feet activity did not occur: Safety/medical concerns        Medical Problem List and Plan: 1.Left side weaknesssecondary to right MCA infarction with right M1 occlusion status post minimal recannulization after mechanical thrombectomy as well as history of left MCA infarction April 2019. Advise 30 day cardiac monitor on discharge  Continue CIR  PRAFO/WHO nightly 2. DVT Prophylaxis/Anticoagulation: Subcutaneous Lovenox. 3. Pain Management:Tylenol as needed 4. Mood:Aricept 5 mg daily 5. Neuropsych: This patienthe iscapable of making decisions on herown behalf. 6. Skin/Wound Care:Routine skin checks 7. Fluids/Electrolytes/Nutrition:Routine ins and outs  IVF [place] on 11/27 8. Cardiomyopathy/chronic combined diastolic congestive heart failure. Monitor for any signs of fluid overload. 9. Hypertension. Coreg 12.5 mg twice a day. Monitor with increased mobility Vitals:   02/27/18 2115 02/28/18 0613  BP: (!) 140/57 (!) 145/76  Pulse: 67 81  Resp: 16 16  Temp: 98.4 F (36.9 C) 97.8 F (36.6 C)  SpO2: 100% 100%   Relatively controlled on 12/3 10. COPD/tobacco abuse. Continue inhalers. Provide counseling 11. Hyperlipidemia. Lipitor 12. Acute on chronic anemia.   Hemoglobin 8.5 on 12/2  Continue to monitor 13.  Hx gout left great toe does not appear acutely inflammed - tylenol prn, may need foot orthosis if pt has pain with WB 14.  CKD ?Stage II  Creatinine 0.90 on 12/2  Continue to monitor 15.  Hypoalbuminemia  Supplement initiated on 11/25 16.  Hypokalemia  Potassium 4.0 on 12/2  Supplement initiated x3 days  LOS: 12 days A FACE TO FACE EVALUATION WAS PERFORMED  Brandy [name] 02/28/2018, 8:26 AM

## 2018-03-01 ENCOUNTER — Inpatient Hospital Stay (HOSPITAL_COMMUNITY): Payer: Medicare Other | Admitting: Occupational Therapy

## 2018-03-01 ENCOUNTER — Inpatient Hospital Stay (HOSPITAL_COMMUNITY): Payer: Medicare Other | Admitting: *Deleted

## 2018-03-01 ENCOUNTER — Inpatient Hospital Stay (HOSPITAL_COMMUNITY): Payer: Medicare Other

## 2018-03-01 ENCOUNTER — Inpatient Hospital Stay (HOSPITAL_COMMUNITY): Payer: Medicare Other | Admitting: Physical Therapy

## 2018-03-01 NOTE — Consult Note (Signed)
Neuropsychological Consultation   Patient:   Brandy [name]   DOB:   1940/05/26  MR Number:  [id number]  Location:  [facility] [address] [facility] [address] [place] [id number] [place] [address] Dept: [place]: [phone]           Date of Service:   03/01/2018  Start Time:   3 PM End Time:   4 PM  Provider/Observer:  Ilean Skill, Psy.D.       Clinical Neuropsychologist       Billing Code/Service: [phone] 4 Units  Chief Complaint:    Brandy [name] is a 77 year old right-handed female with a history of cardiomyopathy, chronic combined systolic and diastolic congestive heart failure, hypertension, COPD/tobacco abuse, CVA 06/2017.  The patient presented to [facility] on 02/10/2018 with left-sided weakness and right gaze preference.  Cranial CT scan showed mixed pattern of both acute suspected subacute right hemisphere ischemia in the setting of hyperdense right M1 MCA and acute left-sided weakness consistent with emergent large vessel occlusion on the right proximal MCA.  CT angiogram did confirm right MCA occlusion.  The patient was transferred to [facility] for mechanical thrombectomy.  The patient later had MRI follow-up showing a large area of infarction right MCA territory as well as hemorrhage within the right basal ganglia.  The patient has good continued left side hemiparesis.  Reason for Service:  The patient was referred for neuropsychological consultation due to adjustment coping issues.  Below is the HPI for the current admission.  Brandy [name] is a 77 year old right-handed female with history of cardiomyopathy, chronic combined systolic and diastolic congestive heart failure, hypertension,COPD/ tobacco abuse, CVA 06/2017 to [facility] maintained on aspirin 162 mg daily. Per chart review patient lives with spouse. 2 level home with one step to entry. Reportedly independent  prior to admission she still drives and prepares meals. She does have a housekeeper. [facility] with left-sided weakness and right gaze preference. Cranial CT scan showed mixed pattern of both acute suspected subacute right hemisphere ischemia in the setting of hyperdense right M1 MCA and acute left side weakness consistent with emergent large vessel occlusion on the right proximal MCA. CT angiogram head and neck acute right M1 MCA occlusion. Patient was transferred to [facility] underwent mechanical thrombectomy minimal recannulization of occluded right MCA M1 segment per interventional radiology.Follow-upMRI the brain showed large area infarction right MCA territory as well as petechial hemorrhage within the right basal ganglia. Echocardiogram with ejection fraction of 40% diffuse hypokinesis. TEE completed 02/13/2018 with ejection fraction of 40% could not rule out early forming thrombus in the L AA versus artifact from posterior wall of the appendage. Multiple runs of nonsustained A. Tach and PACs were noted during procedure. Recommendations were for 30 day cardiac event monitor on discharge. Patient is maintained on aspirin and Plavix for CVA prophylaxis. Subcutaneous Lovenox for DVT prophylaxis. Dysphagia #1 thin liquid diet.Acute on chronic anemia 8.5 as well as platelets 120,000 and closely monitored. Therapy evaluations completed with recommendations of physical medicine rehabilitation consult. Patient was admitted for a comprehensive rehabilitation program.  Current Status:  The patient reports that she is having some depressive symptoms with loss of function.  The patient's husband as well as brother were also present.  The brother expressed some concerns about the patient's mood as well.  The patient expressed some dissatisfaction with some of the things that she is being asked to  consume to keep adequate nutrition going.  However, she does report that she is  continued to be motivated for therapies and is understanding why she is going through physical and occupational therapeutic interventions.  The patient reports that while depressive symptoms are present she is not being overwhelmed by the symptoms and they are not keeping her from fully participating in the therapeutic interventions.   Behavioral Observation: Brandy [name]  presents as a 77 y.o.-year-old Right African American Female who appeared her stated age. her dress was Appropriate and she was Well Groomed and her manners were Appropriate to the situation.  her participation was indicative of Appropriate and Redirectable behaviors.  There were any physical disabilities noted.  she displayed an appropriate level of cooperation and motivation.     Interactions:    Active Appropriate and Redirectable  Attention:   abnormal and attention span appeared shorter than expected for age  Memory:   within normal limits; recent and remote memory intact  Visuo-spatial:  not examined  Speech (Volume):  low  Speech:   normal; slowed speech patterns  Thought Process:  Coherent and Relevant  Though Content:  WNL; not suicidal and not homicidal  Orientation:   person, place, time/date and situation  Judgment:   Fair  Planning:   Fair  Affect:    Lethargic  Mood:    Dysphoric  Insight:   Fair  Intelligence:   normal    Medical History:   Past Medical History:  Diagnosis Date  . Cardiomyopathy    a. EF of 25% in 07/2006 b. EF normalized by repeat echo in 2011 c. EF 35-40% by echo in 05/2017 with cath showing mild nonobstructive CAD  . Chronic combined systolic and diastolic CHF (congestive heart failure) ([place])   . Colon polyps   . Diverticulosis of colon   . Hypertension   . Hypertensive heart disease   . Osteopenia   . TIA (transient ischemic attack)   . Tobacco abuse    50 pack year   Psychiatric History:  The patient denies any prior history of depression or anxiety or  other psychiatric symptoms.  Family Med/Psych History:  Family History  Problem Relation Age of Onset  . Hypertension Brother   . Hypertension Maternal Grandmother   . Cancer Paternal Grandmother     Risk of Suicide/Violence: virtually non-existent   Impression/DX:  Brandy [name] is a 77 year old right-handed female with a history of cardiomyopathy, chronic combined systolic and diastolic congestive heart failure, hypertension, COPD/tobacco abuse, CVA 06/2017.  The patient presented to [facility] on 02/10/2018 with left-sided weakness and right gaze preference.  Cranial CT scan showed mixed pattern of both acute suspected subacute right hemisphere ischemia in the setting of hyperdense right M1 MCA and acute left-sided weakness consistent with emergent large vessel occlusion on the right proximal MCA.  CT angiogram did confirm right MCA occlusion.  The patient was transferred to [facility] for mechanical thrombectomy.  The patient later had MRI follow-up showing a large area of infarction right MCA territory as well as hemorrhage within the right basal ganglia.  The patient has good continued left side hemiparesis.  The patient reports that she is having some depressive symptoms with loss of function.  The patient's husband as well as brother were also present.  The brother expressed some concerns about the patient's mood as well.  The patient expressed some dissatisfaction with some of the things that she is being asked to consume to  keep adequate nutrition going.  However, she does report that she is continued to be motivated for therapies and is understanding why she is going through physical and occupational therapeutic interventions.  The patient reports that while depressive symptoms are present she is not being overwhelmed by the symptoms and they are not keeping her from fully participating in the therapeutic interventions.  Disposition/Plan:  Today, we worked on  adjustment coping skills regarding the residual motor deficits that the patient has following her right MCA stroke.  While specific treatment plans are still in the process of being made I will follow-up with the patient next week if need be regarding potential monitoring assessment for the development of possible depressive symptoms.  Diagnosis:    Acute embolic stroke ([place]) - Plan: atorvastatin (LIPITOR) tablet 10 mg         Electronically Signed   _______________________ Ilean Skill, Psy.D.

## 2018-03-01 NOTE — Progress Notes (Addendum)
[facility] PHYSICAL MEDICINE & REHABILITATION PROGRESS NOTE   Subjective/Complaints: Patient seen laying in bed this morning.  She states she slept well overnight.  Husband not at bedside this morning.  ROS: Denies CP, shortness of breath, nausea, vomiting, diarrhea.  Objective:   No results found. Recent Labs    02/27/18 0828  WBC 5.4  HGB 8.5*  HCT 27.4*  PLT 428*   Recent Labs    02/27/18 0828  NA 144  K 4.0  CL 114*  CO2 26  GLUCOSE 94  BUN 19  CREATININE 0.90  CALCIUM 8.8*    Intake/Output Summary (Last 24 hours) at 03/01/2018 0948 Last data filed at 02/28/2018 1831 Gross per 24 hour  Intake 175 ml  Output -  Net 175 ml     Physical Exam: Vital Signs Blood pressure 129/67, pulse 66, temperature 98.6 F (37 C), temperature source Oral, resp. rate 18, height 5\' 4"  (1.626 m), weight 85.7 kg, SpO2 98 %.  Constitutional: No distress . Vital signs reviewed. HENT: Normocephalic.  Atraumatic. Eyes: EOMI. No discharge. Cardiovascular: RRR.  No JVD. Respiratory: CTA bilaterally.  Normal effort. GI: BS +. Non-distended. Musc: Edema left hand and foot. Neurological: Alert. Dysarthria Follows commands. Left inattention Sensation absent to light touch left upper and lower extremity Motor: LUE/LLE: 0/5 proximal to distal, unchanged Right lower extremity: ?2/5 hip flexion, knee extension, 4+/5 ankle dorsiflexion Skin: Skin iswarmand dry.  Assessment/Plan: 1. Functional deficits secondary to right MCA infarction with right M1 occlusion status post minimal recannulization after mechanical thrombectomy as well as history of left MCA infarction April 2019. which require 3+ hours per day of interdisciplinary therapy in a comprehensive inpatient rehab setting.  Physiatrist is providing close team supervision and 24 hour management of active medical problems listed below.  Physiatrist and rehab team continue to assess barriers to discharge/monitor patient progress  toward functional and medical goals  Care Tool:  Bathing    Body parts bathed by patient: Left arm, Chest, Abdomen, Right upper leg, Left upper leg, Right lower leg, Face   Body parts bathed by helper: Front perineal area, Buttocks, Right arm, Left lower leg     Bathing assist Assist Level: 2 Helpers     Upper Body Dressing/Undressing Upper body dressing   What is the patient wearing?: Pull over shirt    Upper body assist Assist Level: Maximal Assistance - Patient 25 - 49%    Lower Body Dressing/Undressing Lower body dressing      What is the patient wearing?: Pants     Lower body assist Assist for lower body dressing: 2 Helpers     Toileting Toileting    Toileting assist Assist for toileting: 2 Helpers     Transfers Chair/bed transfer  Transfers assist     Chair/bed transfer assist level: 2 Helpers(stand pivot 3 muskateers)     Locomotion Ambulation   Ambulation assist   Ambulation activity did not occur: Safety/medical concerns  Assist level: Dependent - Patient 0% Assistive device: Lite Gait Max distance: 98ft    Walk 10 feet activity   Assist  Walk 10 feet activity did not occur: Safety/medical concerns  Assist level: Dependent - Patient 0% Assistive device: Lite Gait   Walk 50 feet activity   Assist Walk 50 feet with 2 turns activity did not occur: Safety/medical concerns         Walk 150 feet activity   Assist Walk 150 feet activity did not occur: Safety/medical concerns  Walk 10 feet on uneven surface  activity   Assist Walk 10 feet on uneven surfaces activity did not occur: Safety/medical concerns         Wheelchair     Assist   Type of Wheelchair: Manual    Wheelchair assist level: Dependent - Patient 0% Max wheelchair distance: 139ft    Wheelchair 50 feet with 2 turns activity    Assist        Assist Level: Dependent - Patient 0%   Wheelchair 150 feet activity     Assist  Wheelchair 150 feet activity did not occur: Safety/medical concerns        Medical Problem List and Plan: 1.Left side weaknesssecondary to right MCA infarction with right M1 occlusion status post minimal recannulization after mechanical thrombectomy as well as history of left MCA infarction April 2019. Advise 30 day cardiac monitor on discharge  Continue CIR  PRAFO/WHO nightly  Team conference today to discuss current and goals and coordination of care, home and environmental barriers, and discharge planning with nursing, case manager, and therapies.  2. DVT Prophylaxis/Anticoagulation: Subcutaneous Lovenox. 3. Pain Management:Tylenol as needed 4. Mood:Aricept 5 mg daily 5. Neuropsych: This patienthe iscapable of making decisions on herown behalf. 6. Skin/Wound Care:Routine skin checks 7. Fluids/Electrolytes/Nutrition:Routine ins and outs  IVF [place] on 11/27  D2 thins, advance diet as tolerated 8. Cardiomyopathy/chronic combined diastolic congestive heart failure. Monitor for any signs of fluid overload. 9. Hypertension. Coreg 12.5 mg twice a day. Monitor with increased mobility Vitals:   03/01/18 0803 03/01/18 0821  BP:  129/67  Pulse: 80 66  Resp: 18   Temp:    SpO2: 98%    Controlled on 12/4 10. COPD/tobacco abuse. Continue inhalers. Provide counseling 11. Hyperlipidemia. Lipitor 12. Acute on chronic anemia.   Hemoglobin 8.5 on 12/2  Continue to monitor 13.  Hx gout left great toe does not appear acutely inflammed - tylenol prn, may need foot orthosis if pt has pain with WB 14.  CKD ?Stage II  Creatinine 0.90 on 12/2  Continue to monitor 15.  Hypoalbuminemia  Supplement initiated on 11/25 16.  Hypokalemia  Potassium 4.0 on 12/2  Will order labs for the end of the week  LOS: 13 days A FACE TO FACE EVALUATION WAS PERFORMED  Ankit [name] 03/01/2018, 9:48 AM

## 2018-03-01 NOTE — Progress Notes (Signed)
Occupational Therapy Session Note  Patient Details  Name: Brandy [name] MRN: [id number] Date of Birth: 10/26/1940  Today's Date: 03/01/2018 OT Individual Time: 1345-1430 OT Individual Time Calculation (min): 45 min    Short Term Goals: Week 2:  OT Short Term Goal 1 (Week 2): Pt will complete sliding board transfer from bed to wheelchair with max assist.   OT Short Term Goal 2 (Week 2): Pt will maintain static sitting balance EOB or EOM with min assist in preparation for selfcare tasks.  OT Short Term Goal 3 (Week 2): Pt will maintain dynamic sitting balance during UB bathing with mod assist.  OT Short Term Goal 4 (Week 2): Pt will complete sit to stand with total assist +2 (pt 35%) during LB selfcare tasks.  OT Short Term Goal 5 (Week 2): Pt will perform toilet transfer with total assist +2 (pt 25%) using the Stedy.   Skilled Therapeutic Interventions/Progress Updates:    Therapist took pt down to the therapy gym for work on neuromuscular re-education and sitting balance.  Total assist for sliding board transfers from wheelchair to mat with use of 5' step under her feet.  Once in the mat, utilized mirror for feedback on sitting balance.  She is able to maintain static sitting balance with min assist.  Still with increased cervical flexion and trunk flexion in sitting with pushing to the left.  Had pt work on functional reaching to the right with the RUE to increase weightbearing over the pushing side.  Pt with increased LOB to the left when attempting to reach, requiring max assist to correct.  Completed standing X1 for 45 seconds three muskateers technique and total assist +2 (pt 20%).  Pt also completed [place] squat pivot transfer to the wheelchair to the left with total +2 (pt 20%).  Pt left in room at end of session in tilt in space wheelchair with safety alarm belt in place and LUE supported on half lap tray.    Therapy Documentation Precautions:  Precautions Precautions:  Fall Precaution Comments: mild Lt inattention, left hemiplegia, pusher to the left, motor impersistence Restrictions Weight Bearing Restrictions: No   Pain: Pain Assessment Pain Score: 0-No pain  Therapy/Group: Individual Therapy  Laiken Sandy OTR/L 03/01/2018, 2:51 PM

## 2018-03-01 NOTE — Progress Notes (Signed)
Speech Language Pathology Daily Session Note  Patient Details  Name: Brandy [name] MRN: [id number] Date of Birth: Nov 23, 1940  Today's Date: 03/01/2018 SLP Individual Time: 1000-1030 SLP Individual Time Calculation (min): 30 min  Short Term Goals: Week 2: SLP Short Term Goal 1 (Week 2): Pt will consume dys 2 textures and thin liquids with supervision cues for use of swallowing precautions and minimal overt s/s of aspiration.   SLP Short Term Goal 2 (Week 2): Pt will sustain her attention to basic, familiar tasks for 5 minutes with Min A cues for redirection.   SLP Short Term Goal 3 (Week 2): Pt will locate items to the left of midline during functional tasks in >50% of opportunities with Mod assist.   SLP Short Term Goal 4 (Week 2): Pt will complete basic, familiar tasks with Mod cues for functional problem solving.   SLP Short Term Goal 5 (Week 2): Pt will recall basic, daily information with Min assist verbal cues.   SLP Short Term Goal 6 (Week 2): Pt will increase vocal intensity to achieve intelligibility at the phrase level with mod assist to achieve > 75% intelligibility.    Skilled Therapeutic Interventions:Skilled ST services focused on swallow and cognitive skills. SLP facilitated PO consumption of thin via cup and dys 3 trial snack, pt demonstrated no overt aspiration nor anterior spillage with min oral residue, pt required min A verbal cues to clear oral cavity. SLP facilitated basic problem solving, scanning to left of midline and speech intelligibility at phrase level utilizing picture cards to idenitify problems, pt required min A verbal cues for problem solving, mod-min A verbal cues to scan left and min A verbal cues for speech intelligiblitiy of 80% at phrase level. Pt was alert with increased sustained attention to 10 minutes with mod A verbal cues. [name] brother was present for session, likely motivating pt's performance. Pt was left in room with call [name] within reach, visitor in  room and chair alarm set. SLP reccomends to continue skilled services.     Pain Pain Assessment Pain Score: 0-No pain  Therapy/Group: Individual Therapy  Sammie Denner  [facility] 03/01/2018, 3:45 PM

## 2018-03-01 NOTE — Progress Notes (Signed)
Occupational Therapy Session Note  Patient Details  Name: Brandy [name] MRN: [id number] Date of Birth: Apr 18, 1940  Today's Date: 03/01/2018 OT Individual Time: 0905-1002 OT Individual Time Calculation (min): 57 min    Short Term Goals: Week 2:  OT Short Term Goal 1 (Week 2): Pt will complete sliding board transfer from bed to wheelchair with max assist.   OT Short Term Goal 2 (Week 2): Pt will maintain static sitting balance EOB or EOM with min assist in preparation for selfcare tasks.  OT Short Term Goal 3 (Week 2): Pt will maintain dynamic sitting balance during UB bathing with mod assist.  OT Short Term Goal 4 (Week 2): Pt will complete sit to stand with total assist +2 (pt 35%) during LB selfcare tasks.  OT Short Term Goal 5 (Week 2): Pt will perform toilet transfer with total assist +2 (pt 25%) using the Stedy.   Skilled Therapeutic Interventions/Progress Updates:    Pt completed bathing and dressing sit to stand from the EOB during session.  Max assist for transfer to the EOB on the right side.  She needed max assist for static sitting balance as well as dynamic standing balance with bathing tasks.  Incorporated functional reaching to the right laterally to retrieve items from bedside table, in order to decrease pushing to the left side.  Max demonstrational cueing for anterior pelvic tilt and cervical extension initiation.  Pt with motor impersistence with decreased ability to sustain activation of cervical extension, or lumbar extensors.  Total +2 (pt 25%) for sit to stand and standing when washing peri area and buttocks. Pt exhibits decreased ability to achieve upright trunk, hip, or knee extension.  Max demonstrational cueing with max assist to donn pullover shirt and pants following hemidressing techniques as well.  Pt exhibits decreased ability to unweight the RLE for placing it into a garment unless she is stabilizing with her RUE.  She completed transfer to the wheelchair with total  assist (+2 pt 25%) sliding board transfer.  Max assist for brushing hair secondary to decreased thoroughness to complete ADL.  Pt left with safety belt in place and call button and phone in reach.   Therapy Documentation Precautions:  Precautions Precautions: Fall Precaution Comments: mild Lt inattention, left hemiplegia, pusher to the left, motor impersistence Restrictions Weight Bearing Restrictions: No   Pain: Pain Assessment Pain Scale: Faces Pain Score: 0-No pain ADL:  Therapy/Group: Individual Therapy  Halston Fairclough OTR/L 03/01/2018, 12:28 PM

## 2018-03-01 NOTE — Progress Notes (Signed)
Physical Therapy Session Note  Patient Details  Name: Brandy [name] MRN: [id number] Date of Birth: 1940/07/24  Today's Date: 03/01/2018 PT Individual Time: 1100-1200 PT Individual Time Calculation (min): 60 min   Short Term Goals: Week 2:  PT Short Term Goal 1 (Week 2): Pt will perform sit<>stand with max assist  PT Short Term Goal 2 (Week 2): Pt will transfer to [facility] with max assist  PT Short Term Goal 3 (Week 2): Pt will propell WC 17ft with mod assist using hemi technique.  PT Short Term Goal 4 (Week 2): Pt will maintain sitting balance wiht min assist while engaged in fine motor task at [facility]  Skilled Therapeutic Interventions/Progress Updates: Pt presented in TIS with brother present agreeable to therapy and denies pain. Pt transported to rehab gym and performed SB transfer to R maxA x 2. Pt was able to initiate and perform x 3 lateral scoots to R with minA. Performed R and anterior reaching activities this session to engage R trunk with focus on midline. Pt participated in reaching at wall with pt requiring minA for increased R lateral reach. Pt encouraged to reach and brace against wall however pt unable to maintain reach for more than 5 sec. Pt required mod multimodal cues for maintaining forward gaze. Pt also participated in lateral and forward reaches in same manner with manual facilitation to return to midline. With fatigue pt required mod/max multimodal cues for initiation of returning to midline. Attempted lateral scoots to L however required maxA with minimal initiation from pt. Performed SB transfer to L maxA x 2 to [place] chair. Pt returned to room and left with belt alarm on, call [place] within reach, and brother present.       Therapy Documentation Precautions:  Precautions Precautions: Fall Precaution Comments: mild Lt inattention, left hemiplegia, pusher to the left, motor impersistence Restrictions Weight Bearing Restrictions: No General:   Vital Signs: Therapy Vitals Temp:  97.6 F (36.4 C) Temp Source: Oral Pulse Rate: (!) 54 Resp: 16 BP: 124/60 Patient Position (if appropriate): Sitting Oxygen Therapy SpO2: 98 % O2 Device: Room Air Pain: Pain Assessment Pain Score: 0-No pain Mobility:   Locomotion :    Trunk/Postural Assessment :    Balance:   Exercises:   Other Treatments:      Therapy/Group: Individual Therapy  Aki Abalos  [name], PTA  03/01/2018, 4:05 PM

## 2018-03-02 ENCOUNTER — Inpatient Hospital Stay (HOSPITAL_COMMUNITY): Payer: Medicare Other | Admitting: Speech Pathology

## 2018-03-02 ENCOUNTER — Inpatient Hospital Stay (HOSPITAL_COMMUNITY): Payer: Medicare Other | Admitting: Occupational Therapy

## 2018-03-02 ENCOUNTER — Inpatient Hospital Stay (HOSPITAL_COMMUNITY): Payer: Medicare Other | Admitting: Physical Therapy

## 2018-03-02 LAB — CREATININE, SERUM
CREATININE: 1 mg/dL (ref 0.44–1.00)
GFR, EST NON AFRICAN AMERICAN: 55 mL/min — AB (ref >60)

## 2018-03-02 NOTE — Progress Notes (Signed)
Physical Therapy Session Note  Patient Details  Name: Brandy [name] MRN: [id number] Date of Birth: 09-Jun-1940  Today's Date: 03/02/2018 PT Individual Time: 1300-1405 PT Individual Time Calculation (min): 65 min   Short Term Goals: Week 2:  PT Short Term Goal 1 (Week 2): Pt will perform sit<>stand with max assist  PT Short Term Goal 2 (Week 2): Pt will transfer to [facility] with max assist  PT Short Term Goal 3 (Week 2): Pt will propell WC 147f with mod assist using hemi technique.  PT Short Term Goal 4 (Week 2): Pt will maintain sitting balance wiht min assist while engaged in fine motor task at EOB  Skilled Therapeutic Interventions/Progress Updates:    Pt received sitting in TIS WC and agreeable to PT  [facility] R with max assist to stabilize trunk and min assist to preform lateral scoot once trunk stabilized. Sit>supine with mod assist through long sitting and max cues for sequencing and awareness of the LLE.  Neuromotor re-education in supine>sidelying. Partial sidelying to sidelying to the R and L 4 bouts x 6 bilaterally; facilitation in trunk/scapula to improve activation of core muscles and improve sequencing of muscle recruitment to prevent compensatory movements. Pt noted to require decreased assist with increased repetitions. Bridges in supine with mod assist from PT to stabilize the LLE and facilitate improved trunk and L glute activation. Lateral scooting in supine with max assist from PT with max cues for sequencing and LLE stabilization.    SB transfer to ultra hemi WC with max assist to the R max cues for posture and safety awareness of the RUE. PT instructed in WC mobility using hemi technique and min assist progressing to supervision assist x 759f Moderate cues for improved use of UE and LE together to prevent veering to the L. SB transfer to mat table [facility] + 2 for safety due to fatigue.   [facility] returned  to [facility] reach and all needs met.        Therapy Documentation Precautions:  Precautions Precautions: Fall Precaution Comments: mild Lt inattention, left hemiplegia, pusher to the left, motor impersistence Restrictions Weight Bearing Restrictions: No Pain: Pain Assessment Pain Scale: Faces Pain Score: 0-No pain   Therapy/Group: Individual Therapy  AuLorie Phenix2/07/2017, 2:48 PM

## 2018-03-02 NOTE — Plan of Care (Signed)
  Problem: Consults Goal: RH STROKE PATIENT EDUCATION Description See Patient Education module for education specifics  Outcome: Progressing   Problem: RH BOWEL ELIMINATION Goal: RH STG MANAGE BOWEL WITH ASSISTANCE Description STG Manage Bowel with max Assistance.   Outcome: Progressing Goal: RH STG MANAGE BOWEL W/MEDICATION W/ASSISTANCE Description STG Manage Bowel with Medication with max Assistance.   Outcome: Progressing   Problem: RH BLADDER ELIMINATION Goal: RH STG MANAGE BLADDER WITH ASSISTANCE Description STG Manage Bladder With max Assistance   Outcome: Progressing Goal: RH STG MANAGE BLADDER WITH MEDICATION WITH ASSISTANCE Description STG Manage Bladder With Medication With max Assistance.   Outcome: Progressing   Problem: RH SKIN INTEGRITY Goal: RH STG SKIN FREE OF INFECTION/BREAKDOWN Description Skin to remain free of breakdown and infection with max assist  Outcome: Progressing Goal: RH STG MAINTAIN SKIN INTEGRITY WITH ASSISTANCE Description STG Maintain Skin Integrity With max Assistance.   Outcome: Progressing   Problem: RH SAFETY Goal: RH STG ADHERE TO SAFETY PRECAUTIONS W/ASSISTANCE/DEVICE Description STG Adhere to Safety Precautions With mod Assistance/Device.  Outcome: Progressing Goal: RH STG DECREASED RISK OF FALL WITH ASSISTANCE Description STG Decreased Risk of Fall With min/mod Assistance.  Outcome: Progressing   Problem: RH COGNITION-NURSING Goal: RH STG USES MEMORY AIDS/STRATEGIES W/ASSIST TO PROBLEM SOLVE Description STG Uses Memory Aids/Strategies With mod Assistance to Problem Solve.   Outcome: Progressing Goal: RH STG ANTICIPATES NEEDS/CALLS FOR ASSIST W/ASSIST/CUES Description STG Anticipates Needs/Calls for Assist With mod Assistance/Cues.   Outcome: Progressing   Problem: RH PAIN MANAGEMENT Goal: RH STG PAIN MANAGED AT OR BELOW [name] PAIN GOAL Description Less than 3 or no pain  Outcome: Progressing   Problem: RH KNOWLEDGE  DEFICIT Goal: RH STG INCREASE KNOWLEDGE OF HYPERTENSION Description Patient or caregiver will be able to verbalize medication management of hypertension and target BP range with cues/handouts.  Outcome: Progressing Goal: RH STG INCREASE KNOWLEDGE OF DYSPHAGIA/FLUID INTAKE Description Patient/family will be able to demonstrate safe oral intake and management of eating with cues assitance  Outcome: Progressing Goal: RH STG INCREASE KNOWLEGDE OF HYPERLIPIDEMIA Description Patient and caregivers will be able to verbalize medication management of hyperlipidemia and diet to lower cholesterol with cues/handouts  Outcome: Progressing Goal: RH STG INCREASE KNOWLEDGE OF STROKE PROPHYLAXIS Description Patient and caregiver will be able to verbalize medications to prevent stroke and lifestyle modifications to prevent stroke with cues/handouts  Outcome: Progressing   Problem: RH Vision Goal: RH LTG Vision (Specify) Outcome: Progressing

## 2018-03-02 NOTE — Progress Notes (Signed)
Occupational Therapy Session Note  Patient Details  Name: Brandy [name] MRN: [id number] Date of Birth: 10/23/1940  Today's Date: 03/02/2018 OT Individual Time: 1101-1201 OT Individual Time Calculation (min): 60 min    Short Term Goals: Week 2:  OT Short Term Goal 1 (Week 2): Pt will complete sliding board transfer from bed to wheelchair with max assist.   OT Short Term Goal 2 (Week 2): Pt will maintain static sitting balance EOB or EOM with min assist in preparation for selfcare tasks.  OT Short Term Goal 3 (Week 2): Pt will maintain dynamic sitting balance during UB bathing with mod assist.  OT Short Term Goal 4 (Week 2): Pt will complete sit to stand with total assist +2 (pt 35%) during LB selfcare tasks.  OT Short Term Goal 5 (Week 2): Pt will perform toilet transfer with total assist +2 (pt 25%) using the Stedy.   Skilled Therapeutic Interventions/Progress Updates:    Pt completed bathing and dressing from tilt in space wheelchair.  Therapist placed wooded step under her feet to increase support with focus of session on ADL tasks but also maintaining upright midline posture.  Pt continues to need max assist for dynamic sitting balance during bathing and dressing tasks.  Max instructional cueing to maintain cervical extension and to avoid falling to the left side of the chair.  Positioned wash items to the right to promote reaching with the RUE and weight shift over the right hip.  Total +2 (pt 20%) for sit to stand during peri washing and pulling items over hips.  Pt with very little push upright for standing in her head, trunk, or the RLE when attempting to stand.  Finished session with dressing of UB with max assist as well as donning TEDs and shoes with total assist.  Call button and phone in reach as well as safety alarm belt.  Pt tilted back in chair for better head/trunk control.  SW present at end of session.    Therapy Documentation Precautions:  Precautions Precautions:  Fall Precaution Comments: mild Lt inattention, left hemiplegia, pusher to the left, motor impersistence Restrictions Weight Bearing Restrictions: No  Pain: Pain Assessment Pain Scale: Faces Pain Score: 0-No pain Pain Type: Acute pain Pain Location: Leg Pain Orientation: Left Pain Descriptors / Indicators: Aching Pain Frequency: Intermittent Pain Onset: On-going Pain Intervention(s): Medication (See eMAR) ADL: See Care Tool Section for details  Therapy/Group: Individual Therapy  Gunhild Bautch OTR/L 03/02/2018, 12:31 PM

## 2018-03-02 NOTE — Progress Notes (Signed)
[place] PHYSICAL MEDICINE & REHABILITATION PROGRESS NOTE   Subjective/Complaints: Patient seen laying in bed this morning.  She states she slept well overnight.  She states she still from therapies yesterday.  Brother at bedside with questions, discussed.  ROS: Denies CP, shortness of breath, nausea, vomiting, diarrhea.  Objective:   No results found. No results for input(s): WBC, HGB, HCT, PLT in the last 72 hours. Recent Labs    03/02/18 0628  CREATININE 1.00    Intake/Output Summary (Last 24 hours) at 03/02/2018 0850 Last data filed at 03/01/2018 1823 Gross per 24 hour  Intake 160 ml  Output -  Net 160 ml     Physical Exam: Vital Signs Blood pressure (!) 124/51, pulse 63, temperature 99 F (37.2 C), temperature source Oral, resp. rate 18, height 5\' 4"  (1.626 m), weight 85.7 kg, SpO2 93 %.  Constitutional: No distress . Vital signs reviewed. HENT: Normocephalic.  Atraumatic. Eyes: EOMI. No discharge. Cardiovascular: RRR.  No JVD. Respiratory: CTA bilaterally.  Normal effort. GI: BS +. Non-distended. Musc: Edema left hand and foot. Neurological: Alert. Dysarthria Follows commands. Left inattention Sensation absent to light touch left upper and lower extremity Motor: LUE/LLE: 0/5 proximal to distal, stable Right lower extremity: 2/5 hip flexion, knee extension, 4+/5 ankle dorsiflexion No increase in tone Skin: Skin iswarmand dry.  Assessment/Plan: 1. Functional deficits secondary to right MCA infarction with right M1 occlusion status post minimal recannulization after mechanical thrombectomy as well as history of left MCA infarction April 2019. which require 3+ hours per day of interdisciplinary therapy in a comprehensive inpatient rehab setting.  Physiatrist is providing close team supervision and 24 hour management of active medical problems listed below.  Physiatrist and rehab team continue to assess barriers to discharge/monitor patient progress toward  functional and medical goals  Care Tool:  Bathing    Body parts bathed by patient: Left arm, Chest, Abdomen, Right upper leg, Left upper leg, Right lower leg, Face   Body parts bathed by helper: Front perineal area, Buttocks, Left lower leg, Right arm     Bathing assist Assist Level: 2 Helpers     Upper Body Dressing/Undressing Upper body dressing   What is the patient wearing?: Pull over shirt    Upper body assist Assist Level: Maximal Assistance - Patient 25 - 49%    Lower Body Dressing/Undressing Lower body dressing      What is the patient wearing?: Pants     Lower body assist Assist for lower body dressing: 2 Helpers     Toileting Toileting    Toileting assist Assist for toileting: 2 Helpers     Transfers Chair/bed transfer  Transfers assist     Chair/bed transfer assist level: 2 Helpers(sliding board transfer)     Locomotion Ambulation   Ambulation assist   Ambulation activity did not occur: Safety/medical concerns  Assist level: Dependent - Patient 0% Assistive device: Lite Gait Max distance: 48ft    Walk 10 feet activity   Assist  Walk 10 feet activity did not occur: Safety/medical concerns  Assist level: Dependent - Patient 0% Assistive device: Lite Gait   Walk 50 feet activity   Assist Walk 50 feet with 2 turns activity did not occur: Safety/medical concerns         Walk 150 feet activity   Assist Walk 150 feet activity did not occur: Safety/medical concerns         Walk 10 feet on uneven surface  activity   Assist Walk 10  feet on uneven surfaces activity did not occur: Safety/medical concerns         Wheelchair     Assist   Type of Wheelchair: Manual    Wheelchair assist level: Dependent - Patient 0% Max wheelchair distance: 171ft    Wheelchair 50 feet with 2 turns activity    Assist        Assist Level: Dependent - Patient 0%   Wheelchair 150 feet activity     Assist Wheelchair 150  feet activity did not occur: Safety/medical concerns        Medical Problem List and Plan: 1.Left side weaknesssecondary to right MCA infarction with right M1 occlusion status post minimal recannulization after mechanical thrombectomy as well as history of left MCA infarction April 2019. Advise 30 day cardiac monitor on discharge  Continue CIR  PRAFO/WHO nightly  Discussed with brother, will discuss with husband plan for discharge home versus SNF 2. DVT Prophylaxis/Anticoagulation: Subcutaneous Lovenox. 3. Pain Management:Tylenol as needed 4. Mood:Aricept 5 mg daily 5. Neuropsych: This patienthe iscapable of making decisions on herown behalf. 6. Skin/Wound Care:Routine skin checks 7. Fluids/Electrolytes/Nutrition:Routine ins and outs  IVF [place] on 11/27  D2 thins, advance diet as tolerated 8. Cardiomyopathy/chronic combined diastolic congestive heart failure. Monitor for any signs of fluid overload. 9. Hypertension. Coreg 12.5 mg twice a day. Monitor with increased mobility Vitals:   03/02/18 0636 03/02/18 0739  BP: (!) 124/51   Pulse: 63   Resp: 18   Temp: 99 F (37.2 C)   SpO2: 96% 93%   Controlled on 12/5 10. COPD/tobacco abuse. Continue inhalers. Provide counseling 11. Hyperlipidemia. Lipitor 12. Acute on chronic anemia.   Hemoglobin 8.5 on 12/2  Continue to monitor 13.  Hx gout left great toe does not appear acutely inflammed - tylenol prn, may need foot orthosis if pt has pain with WB 14.  CKD ?Stage II  Creatinine 1.0 on 12/5  Continue to monitor 15.  Hypoalbuminemia  Supplement initiated on 11/25 16.  Hypokalemia  Potassium 4.0 on 12/2  Labs ordered for tomorrow  LOS: 14 days A FACE TO FACE EVALUATION WAS PERFORMED  Ankit [name] 03/02/2018, 8:50 AM

## 2018-03-02 NOTE — Progress Notes (Addendum)
Speech Language Pathology Daily Session Note  Patient Details  Name: Brandy [name] MRN: [id number] Date of Birth: [birth year]  Today's Date: 03/02/2018 SLP Individual Time: 0903-1000; 6440-3474 SLP Individual Time Calculation (min): 57 min; 28 min  Short Term Goals: Week 2: SLP Short Term Goal 1 (Week 2): Pt will consume dys 2 textures and thin liquids with supervision cues for use of swallowing precautions and minimal overt s/s of aspiration.   SLP Short Term Goal 2 (Week 2): Pt will sustain her attention to basic, familiar tasks for 5 minutes with Min A cues for redirection.   SLP Short Term Goal 3 (Week 2): Pt will locate items to the left of midline during functional tasks in >50% of opportunities with Mod assist.   SLP Short Term Goal 4 (Week 2): Pt will complete basic, familiar tasks with Mod cues for functional problem solving.   SLP Short Term Goal 5 (Week 2): Pt will recall basic, daily information with Min assist verbal cues.   SLP Short Term Goal 6 (Week 2): Pt will increase vocal intensity to achieve intelligibility at the phrase level with mod assist to achieve > 75% intelligibility.    Skilled Therapeutic Interventions:  Session 1:  Pt was seen for skilled ST targeting dysphagia and cognitive goals.  Pt was in bed, awake, alert, and agreeable to getting out of bed for therapies.  SLP facilitated the session with a functional snack of dys 3 textures to continue working towards diet progression.  [name] intake was minimal and she had decreased awareness of bolus which lead to prolonged mastication of advanced solids and increased anterior spillage.  Pt is making good progress but continue to recommend dys 2 textures at this time with full supervision for use of swallowing precautions.  SLP also facilitated the session with a novel card game targeting visual scanning and sustained attention to task.  Pt was able to scan between left and right visual fields to locate matches of cards with  min verbal cues.  She was able to recall task rules and procedures with supervision question cues.  Pt was returned to her room and left in wheelchair with family at bedside.   Session 2:  Pt was seen for skilled ST targeting cognitive and dysphagia goals.  Therapist facilitated the session with a therapeutic trial of dys 3 textures to continue working towards diet advancement.  Pt still had prolonged and inefficient mastication of advanced solids due to oral motor weakness and inattention to boluses.  Therapist attempted to eliminate environmental distraction; however, without stimulation pt quickly stopped chewing and appeared to forget that she had food in her mouth or would become distracted by things in the environment. When therapist attempted to engage pt in conversation to maximize stimulation, pt would talk with food in her mouth and have spillage of bolus from the oral cavity.  Furthermore, therapist asked pt if she had her bottom dentures prior to initiation of trials to which pt responded "yes;" however, after completion of trials therapist found that pt had not had her dentures in.  While dentures would have improved mastication of solids, her inattention to boluses is still limiting and I continue to recommend dys 2 textures.   Discussed recommendations with pt's husband.  All questions were answered to pt's and husband's satisfaction at this time.  Continue per current plan of care.    Pain Pain Assessment Pain Scale: 0-10 Pain Score: 0-No pain   Therapy/Group: Individual Therapy  Addylin Manke, Selinda Orion  03/02/2018, 9:49 AM

## 2018-03-03 ENCOUNTER — Inpatient Hospital Stay (HOSPITAL_COMMUNITY): Payer: Medicare Other | Admitting: Physical Therapy

## 2018-03-03 ENCOUNTER — Inpatient Hospital Stay (HOSPITAL_COMMUNITY): Payer: Medicare Other | Admitting: Occupational Therapy

## 2018-03-03 ENCOUNTER — Inpatient Hospital Stay (HOSPITAL_COMMUNITY): Payer: Medicare Other | Admitting: Speech Pathology

## 2018-03-03 LAB — BASIC METABOLIC PANEL
Anion gap: 7 (ref 5–15)
BUN: 18 mg/dL (ref 8–23)
CHLORIDE: 113 mmol/L — AB (ref 98–111)
CO2: 25 mmol/L (ref 22–32)
Calcium: 8.7 mg/dL — ABNORMAL LOW (ref 8.9–10.3)
Creatinine, Ser: 1.01 mg/dL — ABNORMAL HIGH (ref 0.44–1.00)
GFR calc Af Amer: >60 mL/min (ref >60)
GFR calc non Af Amer: 54 mL/min — ABNORMAL LOW (ref >60)
GLUCOSE: 82 mg/dL (ref 70–99)
POTASSIUM: 3.4 mmol/L — AB (ref 3.5–5.1)
Sodium: 145 mmol/L (ref 135–145)

## 2018-03-03 MED ORDER — POTASSIUM CHLORIDE CRYS ER 20 MEQ PO TBCR
20.0000 meq | EXTENDED_RELEASE_TABLET | Freq: Every day | ORAL | Status: DC
  Administered 2018-03-03 – 2018-03-16 (×14): 20 meq via ORAL
  Filled 2018-03-03 (×14): qty 1

## 2018-03-03 NOTE — Progress Notes (Signed)
Speech Language Pathology Weekly Progress and Session Note  Patient Details  Name: Brandy [name] MRN: [id number] Date of Birth: 1941-01-22  Beginning of progress report period:  End of progress report period:   Today's Date: 03/03/2018 SLP Individual Time: 1045-1108; 1275-1700 SLP Individual Time Calculation (min): 23 min, 30 min   Short Term Goals: Week 2: SLP Short Term Goal 1 (Week 2): Pt will consume dys 2 textures and thin liquids with supervision cues for use of swallowing precautions and minimal overt s/s of aspiration.   SLP Short Term Goal 1 - Progress (Week 2): Met SLP Short Term Goal 2 (Week 2): Pt will sustain her attention to basic, familiar tasks for 5 minutes with Min A cues for redirection.   SLP Short Term Goal 2 - Progress (Week 2): Met SLP Short Term Goal 3 (Week 2): Pt will locate items to the left of midline during functional tasks in >50% of opportunities with Mod assist.   SLP Short Term Goal 3 - Progress (Week 2): Met SLP Short Term Goal 4 (Week 2): Pt will complete basic, familiar tasks with Mod cues for functional problem solving.   SLP Short Term Goal 4 - Progress (Week 2): Progressing toward goal SLP Short Term Goal 5 (Week 2): Pt will recall basic, daily information with Min assist verbal cues.   SLP Short Term Goal 5 - Progress (Week 2): Met SLP Short Term Goal 6 (Week 2): Pt will increase vocal intensity to achieve intelligibility at the phrase level with mod assist to achieve > 75% intelligibility.   SLP Short Term Goal 6 - Progress (Week 2): Met    New Short Term Goals: Week 3: SLP Short Term Goal 1 (Week 3): Pt will consume trials of dys 3 textures and thin liquids with supervision cues for use of swallowing precautions and minimal overt s/s of aspiration.   SLP Short Term Goal 2 (Week 3): Pt will sustain her attention to basic, familiar tasks for 15 minutes with Min A cues for redirection.   SLP Short Term Goal 3 (Week 3): Pt will locate items to the  left of midline during functional tasks in >50% of opportunities with supervision cues.   SLP Short Term Goal 4 (Week 3): Pt will complete basic, familiar tasks with Mod cues for functional problem solving.   SLP Short Term Goal 5 (Week 3): Pt will recall basic, daily information with supervision verbal cues.   SLP Short Term Goal 6 (Week 3): Pt will increase vocal intensity to achieve intelligibility at the sentence level with min assist to achieve > 75% intelligibility.    Weekly Progress Updates:   Pt has made functionalgains this reporting period and has met 5 out of 6 short term goals.  Pt is currently mod assist for tasks due to cognitive deficits in the setting of right brain dysfunction.  Pt has demonstrated improved speech intelligibility, mastication of dys 2 textures, recall of daily information, and sustained attention to tasks.  Pt is consuming dys 2, thin liquids diet with min cues for use of swallowing precautions.  Pt and family education is ongoing.  Pt would continue to benefit from skilled ST while inpatient in order to maximize functional independence and reduce burden of care prior to discharge.  Anticipate that pt will need 24/7 supervision at discharge in addition to [place] follow at next level of care.       Intensity: Minumum of 1-2 x/day, 30 to 90 minutes Frequency: 3 to 5  out of 7 days Duration/Length of Stay: 21-28 days  Treatment/Interventions: Cognitive remediation/compensation;Cueing hierarchy;Dysphagia/aspiration precaution training;Internal/external aids;Environmental controls;Patient/family education;Functional tasks   Daily Session  Skilled Therapeutic Interventions:  Session 1:  Pt was seen for skilled ST targeting dysphagia goals.  SLP and pt both attempted to place dental implants which were removed in ICU during intubation per husband's report but were unsuccessful.  Bottom and top dentures were in place for trials of dys 3 textures to continue working towards  diet progression.  Pt had improved containment of advanced solids but still had notable left sided pocketing of advanced solids which required multiple liquid washes to clear.  As a result, continue to recommend dys 2 textures and thin liquids with full supervision for use of swallowing precautions.  Pt was left in tilt in space wheelchair with husband at bedside.   Session 2:  Pt was seen for skilled ST targeting communication goals.  SLP facilitated the session with a novel verbal description task to address education and carryover of intelligibility strategies.  Pt needed intermittent min assist verbal cues to increase vocal intensity and overarticulate to achieve speech intelligibility at the phrase level.  Pt was left in bed at the end of today's therapy session with husband and visitor at bedside.  Goals updated on this date to reflect current progress and plan of care.        General    Pain Pain Assessment Pain Scale: 0-10 Pain Score: 0-No pain  Therapy/Group: Individual Therapy  Loneta Tamplin, Selinda Orion 03/03/2018, 4:35 PM

## 2018-03-03 NOTE — Progress Notes (Signed)
Occupational Therapy Session Note  Patient Details  Name: Brandy [name] MRN: [id number] Date of Birth: Jan 08, 1941  Today's Date: 03/03/2018 OT Individual Time: 2725-3664 OT Individual Time Calculation (min): 60 min    Short Term Goals: Week 2:  OT Short Term Goal 1 (Week 2): Pt will complete sliding board transfer from bed to wheelchair with max assist.   OT Short Term Goal 2 (Week 2): Pt will maintain static sitting balance EOB or EOM with min assist in preparation for selfcare tasks.  OT Short Term Goal 3 (Week 2): Pt will maintain dynamic sitting balance during UB bathing with mod assist.  OT Short Term Goal 4 (Week 2): Pt will complete sit to stand with total assist +2 (pt 35%) during LB selfcare tasks.  OT Short Term Goal 5 (Week 2): Pt will perform toilet transfer with total assist +2 (pt 25%) using the Stedy.   Skilled Therapeutic Interventions/Progress Updates:    Pt completed bathing and dressing sit to stand at the EOB.  Max assist for dynamic sitting balance secondary to frequent pushing to the left with LOB.  Min assist for static sitting balance overall with RUE placed on the bed to stabilize.  Pt still demonstrates decreased motor planning for keeping upright posture and will fall posteriorly as well with mod assist to regain her balance.  Total +2 (pt 20% for sit to stand during LB selfcare, with limited ability to activate the right hip, trunk, and cervical extensors.  She was only able to achieve 60% of standing with all trials.  Max assist for donning pullover shirt with total assist to donn TEDs and shoes.  Finished session with transfer to the tilt in space wheelchair with total assist using the sliding board.  Pt left supported in chair with spouse present and call button and phone in reach.    Therapy Documentation Precautions:  Precautions Precautions: Fall Precaution Comments: mild Lt inattention, left hemiplegia, pusher to the left, motor  impersistence Restrictions Weight Bearing Restrictions: No  Pain: Pain Assessment Pain Scale: Faces Pain Score: 0-No pain ADL: See Care Tool Section for some ADL details  Therapy/Group: Individual Therapy  Jenny Omdahl OTR/L 03/03/2018, 12:07 PM

## 2018-03-03 NOTE — Progress Notes (Signed)
[facility] PHYSICAL MEDICINE & REHABILITATION PROGRESS NOTE   Subjective/Complaints: Patient seen sitting up in bed this morning.  She states she slept well overnight.  She is more alert and interactive this morning.  ROS: Denies CP, shortness of breath, nausea, vomiting, diarrhea.  Objective:   No results found. No results for input(s): WBC, HGB, HCT, PLT in the last 72 hours. Recent Labs    03/02/18 0628 03/03/18 0523  NA  --  145  K  --  3.4*  CL  --  113*  CO2  --  25  GLUCOSE  --  82  BUN  --  18  CREATININE 1.00 1.01*  CALCIUM  --  8.7*    Intake/Output Summary (Last 24 hours) at 03/03/2018 0819 Last data filed at 03/02/2018 2014 Gross per 24 hour  Intake 180 ml  Output -  Net 180 ml     Physical Exam: Vital Signs Blood pressure 129/66, pulse 63, temperature 98.2 F (36.8 C), temperature source Oral, resp. rate 17, height 5\' 4"  (1.626 m), weight 85.7 kg, SpO2 98 %.  Constitutional: No distress . Vital signs reviewed. HENT: Normocephalic.  Atraumatic. Eyes: EOMI. No discharge. Cardiovascular: RRR.  No JVD. Respiratory: CTA bilaterally.  Normal effort. GI: BS +. Non-distended. Musc: Edema left hand and foot. Neurological: Alert. Dysarthria Follows commands. Left inattention Sensation absent to light touch left upper and lower extremity Motor: LUE/LLE: 0/5 proximal to distal, unchanged Right lower extremity: 2/5 hip flexion, knee extension, 4+/5 ankle dorsiflexion No increase in tone Skin: Skin iswarmand dry.  Assessment/Plan: 1. Functional deficits secondary to right MCA infarction with right M1 occlusion status post minimal recannulization after mechanical thrombectomy as well as history of left MCA infarction April 2019. which require 3+ hours per day of interdisciplinary therapy in a comprehensive inpatient rehab setting.  Physiatrist is providing close team supervision and 24 hour management of active medical problems listed  below.  Physiatrist and rehab team continue to assess barriers to discharge/monitor patient progress toward functional and medical goals  Care Tool:  Bathing    Body parts bathed by patient: Left arm, Chest, Abdomen, Right upper leg, Left upper leg, Right lower leg, Face   Body parts bathed by helper: Front perineal area, Buttocks, Left lower leg, Right arm     Bathing assist Assist Level: 2 Helpers     Upper Body Dressing/Undressing Upper body dressing   What is the patient wearing?: Pull over shirt    Upper body assist Assist Level: Maximal Assistance - Patient 25 - 49%    Lower Body Dressing/Undressing Lower body dressing      What is the patient wearing?: Pants     Lower body assist Assist for lower body dressing: 2 Helpers     Toileting Toileting    Toileting assist Assist for toileting: 2 Helpers     Transfers Chair/bed transfer  Transfers assist     Chair/bed transfer assist level: Maximal Assistance - Patient 25 - 49%     Locomotion Ambulation   Ambulation assist   Ambulation activity did not occur: Safety/medical concerns  Assist level: Dependent - Patient 0% Assistive device: Lite Gait Max distance: 40ft    Walk 10 feet activity   Assist  Walk 10 feet activity did not occur: Safety/medical concerns  Assist level: Dependent - Patient 0% Assistive device: Lite Gait   Walk 50 feet activity   Assist Walk 50 feet with 2 turns activity did not occur: Safety/medical concerns  Walk 150 feet activity   Assist Walk 150 feet activity did not occur: Safety/medical concerns         Walk 10 feet on uneven surface  activity   Assist Walk 10 feet on uneven surfaces activity did not occur: Safety/medical concerns         Wheelchair     Assist   Type of Wheelchair: Manual    Wheelchair assist level: Minimal Assistance - Patient > 75% Max wheelchair distance: 75    Wheelchair 50 feet with 2 turns  activity    Assist        Assist Level: Minimal Assistance - Patient > 75%   Wheelchair 150 feet activity     Assist Wheelchair 150 feet activity did not occur: Safety/medical concerns        Medical Problem List and Plan: 1.Left side weaknesssecondary to right MCA infarction with right M1 occlusion status post minimal recannulization after mechanical thrombectomy as well as history of left MCA infarction April 2019. Advise 30 day cardiac monitor on discharge  Continue CIR  PRAFO/WHO nightly 2. DVT Prophylaxis/Anticoagulation: Subcutaneous Lovenox. 3. Pain Management:Tylenol as needed 4. Mood:Aricept 5 mg daily 5. Neuropsych: This patienthe iscapable of making decisions on herown behalf. 6. Skin/Wound Care:Routine skin checks 7. Fluids/Electrolytes/Nutrition:Routine ins and outs  IVF [place] on 11/27  D2 thins, advance diet as tolerated 8. Cardiomyopathy/chronic combined diastolic congestive heart failure. Monitor for any signs of fluid overload. 9. Hypertension. Coreg 12.5 mg twice a day. Monitor with increased mobility Vitals:   03/02/18 2044 03/03/18 0740  BP: 124/78 129/66  Pulse: (!) 57 63  Resp: 18 17  Temp: 97.8 F (36.6 C) 98.2 F (36.8 C)  SpO2: 99% 98%   Controlled on 12/6 10. COPD/tobacco abuse. Continue inhalers. Provide counseling 11. Hyperlipidemia. Lipitor 12. Acute on chronic anemia.   Hemoglobin 8.5 on 12/2  Labs ordered for Monday  Continue to monitor 13.  Hx gout left great toe does not appear acutely inflammed - tylenol prn, may need foot orthosis if pt has pain with WB 14.  CKD ?Stage II  Creatinine 1.01 on 12/6  Continue to monitor 15.  Hypoalbuminemia  Supplement initiated on 11/25 16.  Hypokalemia  Potassium 3.4 on 12/6  Daily supplement initiated on 12/6  Labs ordered for Monday  LOS: 15 days A FACE TO FACE EVALUATION WAS PERFORMED  Jannette Cotham [name] 03/03/2018, 8:19 AM

## 2018-03-04 ENCOUNTER — Inpatient Hospital Stay (HOSPITAL_COMMUNITY): Payer: Medicare Other | Admitting: Physical Therapy

## 2018-03-04 MED ORDER — MEGESTROL ACETATE 400 MG/10ML PO SUSP
200.0000 mg | Freq: Two times a day (BID) | ORAL | Status: DC
  Administered 2018-03-04 – 2018-03-15 (×24): 200 mg via ORAL
  Filled 2018-03-04 (×26): qty 5

## 2018-03-04 NOTE — Progress Notes (Signed)
[place] PHYSICAL MEDICINE & REHABILITATION PROGRESS NOTE   Subjective/Complaints:  No issues overnite, husband notes poor appetite since stroke, pt denies abd pain  ROS: Denies CP, shortness of breath, nausea, vomiting, diarrhea.  Objective:   No results found. No results for input(s): WBC, HGB, HCT, PLT in the last 72 hours. Recent Labs    03/02/18 0628 03/03/18 0523  NA  --  145  K  --  3.4*  CL  --  113*  CO2  --  25  GLUCOSE  --  82  BUN  --  18  CREATININE 1.00 1.01*  CALCIUM  --  8.7*    Intake/Output Summary (Last 24 hours) at 03/04/2018 0716 Last data filed at 03/03/2018 1855 Gross per 24 hour  Intake 320 ml  Output -  Net 320 ml     Physical Exam: Vital Signs Blood pressure 126/61, pulse 70, temperature 98.9 F (37.2 C), temperature source Oral, resp. rate 18, height 5\' 4"  (1.626 m), weight 85.7 kg, SpO2 99 %.  Constitutional: No distress . Vital signs reviewed. HENT: Normocephalic.  Atraumatic. Eyes: EOMI. No discharge. Cardiovascular: RRR.  No JVD. Respiratory: CTA bilaterally.  Normal effort. GI: BS +. Non-distended. Musc: Edema left hand and foot. Neurological: Alert. Dysarthria Follows commands. Left inattention Sensation absent to light touch left upper and lower extremity Motor: LUE/LLE: 0/5 proximal to distal, unchanged Right lower extremity: 2/5 hip flexion, knee extension, 4+/5 ankle dorsiflexion No increase in tone Skin: Skin iswarmand dry.  Assessment/Plan: 1. Functional deficits secondary to right MCA infarction with right M1 occlusion status post minimal recannulization after mechanical thrombectomy as well as history of left MCA infarction April 2019. which require 3+ hours per day of interdisciplinary therapy in a comprehensive inpatient rehab setting.  Physiatrist is providing close team supervision and 24 hour management of active medical problems listed below.  Physiatrist and rehab team continue to assess barriers to  discharge/monitor patient progress toward functional and medical goals  Care Tool:  Bathing    Body parts bathed by patient: Left arm, Chest, Abdomen, Right upper leg, Left upper leg, Right lower leg, Face   Body parts bathed by helper: Front perineal area, Buttocks, Left lower leg, Right arm     Bathing assist Assist Level: 2 Helpers     Upper Body Dressing/Undressing Upper body dressing   What is the patient wearing?: Pull over shirt    Upper body assist Assist Level: Maximal Assistance - Patient 25 - 49%    Lower Body Dressing/Undressing Lower body dressing      What is the patient wearing?: Pants     Lower body assist Assist for lower body dressing: 2 Helpers     Toileting Toileting    Toileting assist Assist for toileting: 2 Helpers     Transfers Chair/bed transfer  Transfers assist     Chair/bed transfer assist level: Total Assistance - Patient < 25%(sliding board transfer)     Locomotion Ambulation   Ambulation assist   Ambulation activity did not occur: Safety/medical concerns  Assist level: Dependent - Patient 0% Assistive device: Lite Gait Max distance: 67ft    Walk 10 feet activity   Assist  Walk 10 feet activity did not occur: Safety/medical concerns  Assist level: Dependent - Patient 0% Assistive device: Lite Gait   Walk 50 feet activity   Assist Walk 50 feet with 2 turns activity did not occur: Safety/medical concerns         Walk 150 feet activity  Assist Walk 150 feet activity did not occur: Safety/medical concerns         Walk 10 feet on uneven surface  activity   Assist Walk 10 feet on uneven surfaces activity did not occur: Safety/medical concerns         Wheelchair     Assist   Type of Wheelchair: Manual    Wheelchair assist level: Minimal Assistance - Patient > 75% Max wheelchair distance: 75    Wheelchair 50 feet with 2 turns activity    Assist        Assist Level: Minimal  Assistance - Patient > 75%   Wheelchair 150 feet activity     Assist Wheelchair 150 feet activity did not occur: Safety/medical concerns        Medical Problem List and Plan: 1.Left side weaknesssecondary to right MCA infarction with right M1 occlusion status post minimal recannulization after mechanical thrombectomy as well as history of left MCA infarction April 2019. Advise 30 day cardiac monitor on discharge  Continue CIR PT, OT  PRAFO/WHO nightly 2. DVT Prophylaxis/Anticoagulation: Subcutaneous Lovenox. 3. Pain Management:Tylenol as needed 4. Mood:Aricept 5 mg daily 5. Neuropsych: This patienthe iscapable of making decisions on herown behalf. 6. Skin/Wound Care:Routine skin checks 7. Fluids/Electrolytes/Nutrition:Routine ins and outs  IVF [place] on 11/27  D2 thins, advance diet as tolerated 8. Cardiomyopathy/chronic combined diastolic congestive heart failure. Monitor for any signs of fluid overload. 9. Hypertension. Coreg 12.5 mg twice a day. Monitor with increased mobility Vitals:   03/03/18 2034 03/04/18 0502  BP: 120/77 126/61  Pulse: 63 70  Resp: 14 18  Temp: 98.7 F (37.1 C) 98.9 F (37.2 C)  SpO2: 96% 99%   Controlled on 12/7 10. COPD/tobacco abuse. Continue inhalers. Provide counseling 11. Hyperlipidemia. Lipitor 12. Acute on chronic anemia.   Hemoglobin 8.5 on 12/2  Labs ordered for Monday  Continue to monitor 13.  Hx gout left great toe does not appear acutely inflammed - tylenol prn, may need foot orthosis if pt has pain with WB 14.  CKD ?Stage II  Creatinine 1.01 on 12/6  Continue to monitor 15.  Hypoalbuminemia  Supplement initiated on 11/25 16.  Hypokalemia  Potassium 3.4 on 12/6  KCL 16meq qd 12/6  Labs ordered for Monday  LOS: 16 days A FACE TO [place] E Noheli Melder 03/04/2018, 7:16 AM

## 2018-03-04 NOTE — Progress Notes (Signed)
Physical Therapy Session Note  Patient Details  Name: Brandy [name] MRN: [id number] Date of Birth: [birth year]  Today's Date: 03/04/2018 PT Individual Time: 1520-1545 PT Individual Time Calculation (min): 25 min   Short Term Goals: Week 2:  PT Short Term Goal 1 (Week 2): Pt will perform sit<>stand with max assist  PT Short Term Goal 1 - Progress (Week 2): Progressing toward goal PT Short Term Goal 2 (Week 2): Pt will transfer to [facility] with max assist  PT Short Term Goal 2 - Progress (Week 2): Met PT Short Term Goal 3 (Week 2): Pt will propell WC 166f with mod assist using hemi technique.  PT Short Term Goal 3 - Progress (Week 2): Met PT Short Term Goal 4 (Week 2): Pt will maintain sitting balance wiht min assist while engaged in fine motor task at EOB PT Short Term Goal 4 - Progress (Week 2): Partly met Week 3:  PT Short Term Goal 1 (Week 3): [facility]   PT Short Term Goal 2 (Week 3): Pt will maintian sitting balance with min assist x 3 min  PT Short Term Goal 3 (Week 3): Pt will propell WC 1574fwith min assist using hemi technique  PT Short Term Goal 4 (Week 3): Pt will ambulate at rail in hall x 5 with with max + 2 assist  Week 4:     Skilled Therapeutic Interventions/Progress Updates:   Pt received supine in bed and agreeable to PT. Supine>sit transfer with max assist and max cues for compensatory strategies to control the LLE. Sitting balance EOB x 5 minutes with mod assist progressing to min assist from PT with cues for righting reactions through the head and trunk.   Sit<>stand in stedy with max +2 assist to force WB through the LLE. Sustained Semi stand in stedy with mod assist progressing to min assist from PT. Max cues for upright posture and midline orientation. Return to bed max assist + 2 for safety.   Sit>supine completed with max assist and left supine in bed with call Schmale in reach and all needs met.       Therapy  Documentation Precautions:  Precautions Precautions: Fall Precaution Comments: mild Lt inattention, left hemiplegia, pusher to the left, motor impersistence Restrictions Weight Bearing Restrictions: No    Vital Signs: Therapy Vitals Temp: 99.1 F (37.3 C) Temp Source: Oral Pulse Rate: 78 Resp: 18 BP: 137/77 Patient Position (if appropriate): Lying Oxygen Therapy SpO2: 93 % O2 Device: Room Air Pain:   denies   Therapy/Group: Individual Therapy  AuLorie Phenix2/09/2017, 5:20 PM

## 2018-03-04 NOTE — Progress Notes (Signed)
Physical Therapy Weekly Progress Note  Patient Details  Name: Brandy [name] MRN: [id number] Date of Birth: 1941-02-17  Beginning of progress report period: February 25, 2018 End of progress report period: March 03, 2018  Today's Date: 03/03/2018 PT Individual Time:  1330-1445   75 min   Patient has met 2 of 4 short term goals.  Pt is making slow progress towards long term goals. 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 Limiting increased progress at this time. Pt has progressed to intermittent max assist of 1 for level SB transfers and bed mobility, as well as min-mod assist for sitting balance without distractions. Pt initiated WC mobility over the past week from ultra hemi height WC and is able to use Hemi technique with min assist.   Patient continues to demonstrate the following deficits muscle weakness, muscle joint tightness and muscle paralysis, decreased cardiorespiratoy endurance, abnormal tone, unbalanced muscle activation, motor apraxia, decreased coordination and decreased motor planning, decreased visual motor skills, decreased midline orientation and decreased attention to left, decreased initiation, decreased attention, decreased awareness, decreased problem solving, decreased safety awareness, decreased memory and delayed processing, central origin and decreased sitting balance, decreased standing balance, decreased postural control, hemiplegia and decreased balance strategies and therefore will continue to benefit from skilled PT intervention to increase functional independence with mobility.  Patient progressing toward long term goals..  Continue plan of care.  PT Short Term Goals Week 2:  PT Short Term Goal 1 (Week 2): Pt will perform sit<>stand with max assist  PT Short Term Goal 1 - Progress (Week 2): Progressing toward goal PT Short Term Goal 2 (Week 2): Pt will transfer to [facility] with max assist  PT Short Term Goal 2 - Progress (Week 2):  Met PT Short Term Goal 3 (Week 2): Pt will propell WC 158f with mod assist using hemi technique.  PT Short Term Goal 3 - Progress (Week 2): Met PT Short Term Goal 4 (Week 2): Pt will maintain sitting balance wiht min assist while engaged in fine motor task at EOB PT Short Term Goal 4 - Progress (Week 2): Partly met Week 3:  PT Short Term Goal 1 (Week 3): [facility]   PT Short Term Goal 2 (Week 3): Pt will maintian sitting balance with min assist x 3 min  PT Short Term Goal 3 (Week 3): Pt will propell WC 1533fwith min assist using hemi technique  PT Short Term Goal 4 (Week 3): Pt will ambulate at rail in hall x 5 with with max + 2 assist   Skilled Therapeutic Interventions/Progress Updates:   Pt received sitting in TIS WC and agreeable to PT. SB transfer to Ultra Hemi height WC with max assist from PT. WC mobility training instructed by PT x 12079fnd occassional min assist to prevent hitting obstacles on the L. Moderate multimodal cues for sequencing movements. uphil SB transfer to mat table with max assist +2 for safety and max cues for improved use of RUE and RLE. Sit>supine with total assist from PT. PT applied lite gait harness on in supine with rolling R and L mod assist to roll in both directions with cues for sequencing and safety. Sit<>stand in lite gait x 2 to force WB through BIL. Gait in light gait x [id number] total assist on LLE for limb advancement and WB instance and to improved midline orientation of trunk and head. Pt noted to have improve  ability to WB through RLE and improved midline orientation compared to last attempted gait training in lite gait. Pt returned to room and performed SB transfer to bed with max assist + 2 for safety. Sit>supine completed with max assist for control of the LUE and LLE, and left supine in bed with call [place] in reach and all needs met.        Therapy Documentation Precautions:   Precautions Precautions: Fall Precaution Comments: mild Lt inattention, left hemiplegia, pusher to the left, motor impersistence Restrictions Weight Bearing Restrictions: No Vital Signs: Therapy Vitals Temp: 98.9 F (37.2 C) Temp Source: Oral Pulse Rate: 62 Resp: 16 BP: 126/61 Patient Position (if appropriate): Lying Oxygen Therapy SpO2: 98 % O2 Device: Room Air Pain: denies  Therapy/Group: Individual Therapy  [name] 03/04/2018, 9:00 AM

## 2018-03-05 ENCOUNTER — Inpatient Hospital Stay (HOSPITAL_COMMUNITY): Payer: Medicare Other | Admitting: Occupational Therapy

## 2018-03-05 DIAGNOSIS — N182 Chronic kidney disease, stage 2 (mild): Secondary | ICD-10-CM

## 2018-03-05 NOTE — Progress Notes (Signed)
Occupational Therapy Weekly Progress Note  Patient Details  Name: Brandy [name] MRN: [id number] Date of Birth: Apr 10, 1940  Beginning of progress report period: February 26, 2018 End of progress report period: March 05, 2018  Today's Date: 03/05/2018      Patient has met 1 of 5 short term goals.  Brandy [name] continues to make slower progress than expected with relation to OT tasks and ADLs.  She continues with pusher tendencies to the left side secondary to perceptual and spatial awareness deficits.  Static sitting balance is at a supervision to min assist level as long as she can stabilize with the RUE on the mat or bed beside of her.  If she attempts to lift the RUE, then she will immediately demonstrate LOB to the left.  Dynamic sitting balance is still at a total assist when completing bathing and dressing tasks unsupported EOB.  She continues to need min assist for UB bathing is sitting supported.  Total assist if unsupported.  She needs total assist as well for UB dressing sitting unsupported with max assist for donning pullover shirt supported.  Total assist +2 (pt 20%) for LB bathing and dressing sit to stand.  Brandy [name] continues to demonstrate weakness in her RLE when attempting to stand, resulting in more of a partial stand with increased flexion throughout the knees, hips, trunk, and neck.  LUE functional use is limited with movement currently at a Brunnstrum stage II movement in the arm secondary to some increased tone in the left shoulder adductors, but not active movement.  Left hand function is at a Brunnstrum stage I level with slight edema noted in the hand.  Transfers are currently at a total assist for sliding board at this time.  Based on current slow progress, do not feel pt will be able to return home without further extensive OT at SNF level.  Pt and family are in agreement.  Will continue with current OT POC and downgraded goals until SNF bed is found.    Patient continues to  demonstrate the following deficits: muscle weakness, impaired timing and sequencing, abnormal tone, unbalanced muscle activation and decreased coordination, decreased midline orientation and decreased attention to left, decreased initiation, decreased attention, decreased awareness, decreased problem solving, decreased memory and delayed processing and decreased sitting balance, decreased standing balance, decreased postural control, hemiplegia and decreased balance strategies and therefore will continue to benefit from skilled OT intervention to enhance overall performance with BADL and Reduce care partner burden.  Patient not progressing toward long term goals.  See goal revision..  Continue plan of care.  OT Short Term Goals Week 3:  OT Short Term Goal 1 (Week 3): Pt will complete sliding board transfer from bed to wheelchair with max assist. OT Short Term Goal 2 (Week 3): Pt will maintain dynamic sitting balance during UB bathing with mod assist.  OT Short Term Goal 3 (Week 3): Pt will perform toilet transfer with total assist +2 (pt 25%) using the Stedy.  OT Short Term Goal 4 (Week 3): Pt will complete sit to stand with total assist +2 (pt 35%) during LB selfcare tasks.    Therapy Documentation Precautions:  Precautions Precautions: Fall Precaution Comments: mild Lt inattention, left hemiplegia, pusher to the left, motor impersistence Restrictions Weight Bearing Restrictions: No:  Therapy/Group: Individual Therapy  Dalena Plantz OTR/L  03/05/2018, 9:50 PM

## 2018-03-05 NOTE — Progress Notes (Signed)
Occupational Therapy Session Note  Patient Details  Name: Brandy [name] MRN: [id number] Date of Birth: [birth year]  Today's Date: 03/05/2018 OT Group Time: 1100-1200 OT Group Time Calculation (min): 60 min  Skilled Therapeutic Interventions/Progress Updates:    Pt engaged in therapeutic w/c level dance group focusing on patient choice, UE/LE strengthening, salience, activity tolerance, and social participation. Pt was guided through various dance-based exercises involving UEs/LEs and trunk. All music was selected by group members. Emphasis placed on Lt attention and Lt NMR. With setup, pt able to complete self ROM exercises for affected UE/LE. Able to turn head/scan to Lt limb to tap or stroke with instruction. For a few songs had pt sit upright in TIS to work on trunk control with Max A for correcting Lt lean. She actively assisted with trunk rotation. Her spouse was also present and involved in group. At end of session spouse escorted pt back to room.     Therapy Documentation Precautions:  Precautions Precautions: Fall Precaution Comments: mild Lt inattention, left hemiplegia, pusher to the left, motor impersistence Restrictions Weight Bearing Restrictions: No Vital Signs: Therapy Vitals Pulse Rate: 62 BP: 118/64 Oxygen Therapy SpO2: 96 % O2 Device: Room Air Pain: No s/s pain during tx    ADL: ADL Eating: Minimal assistance Where Assessed-Eating: Wheelchair Grooming: Minimal assistance Where Assessed-Grooming: Wheelchair Upper Body Bathing: Moderate cueing Where Assessed-Upper Body Bathing: Wheelchair Lower Body Bathing: Dependent Where Assessed-Lower Body Bathing: Wheelchair Upper Body Dressing: Dependent Where Assessed-Upper Body Dressing: Wheelchair Lower Body Dressing: Dependent Where Assessed-Lower Body Dressing: Wheelchair Toileting: Dependent Where Assessed-Toileting: Bed level Toilet Transfer: Not assessed Toilet Transfer Method: Not assessed Tub/Shower  Transfer: Not assessed Walk-In Shower Transfer: Not assessed      Therapy/Group: Group Therapy  Andrina Locken A Velena Keegan 03/05/2018, 12:28 PM

## 2018-03-05 NOTE — Progress Notes (Signed)
[facility] PHYSICAL MEDICINE & REHABILITATION PROGRESS NOTE   Subjective/Complaints:  No issues overnite, doesn't like taste of megace  ROS: Denies CP, shortness of breath, nausea, vomiting, diarrhea.  Objective:   No results found. No results for input(s): WBC, HGB, HCT, PLT in the last 72 hours. Recent Labs    03/03/18 0523  NA 145  K 3.4*  CL 113*  CO2 25  GLUCOSE 82  BUN 18  CREATININE 1.01*  CALCIUM 8.7*    Intake/Output Summary (Last 24 hours) at 03/05/2018 1610 Last data filed at 03/05/2018 0530 Gross per 24 hour  Intake 360 ml  Output -  Net 360 ml     Physical Exam: Vital Signs Blood pressure 116/61, pulse (!) 59, temperature 97.6 F (36.4 C), temperature source Oral, resp. rate 18, height 5\' 4"  (1.626 m), weight 85.7 kg, SpO2 97 %.  Constitutional: No distress . Vital signs reviewed. HENT: Normocephalic.  Atraumatic. Eyes: EOMI. No discharge. Cardiovascular: RRR.  No JVD. Respiratory: CTA bilaterally.  Normal effort. GI: BS +. Non-distended. Musc: Edema left hand and foot. Neurological: Alert. Dysarthria Follows commands. Left inattention Sensation absent to light touch left upper and lower extremity Motor: LUE/LLE: 0/5 proximal to distal, unchanged Right lower extremity: 2/5 hip flexion, knee extension, 4+/5 ankle dorsiflexion No increase in tone Skin: Skin iswarmand dry.  Assessment/Plan: 1. Functional deficits secondary to right MCA infarction with right M1 occlusion status post minimal recannulization after mechanical thrombectomy as well as history of left MCA infarction April 2019. which require 3+ hours per day of interdisciplinary therapy in a comprehensive inpatient rehab setting.  Physiatrist is providing close team supervision and 24 hour management of active medical problems listed below.  Physiatrist and rehab team continue to assess barriers to discharge/monitor patient progress toward functional and medical goals  Care  Tool:  Bathing    Body parts bathed by patient: Left arm, Chest, Abdomen, Right upper leg, Left upper leg, Right lower leg, Face   Body parts bathed by helper: Front perineal area, Buttocks, Left lower leg, Right arm     Bathing assist Assist Level: 2 Helpers     Upper Body Dressing/Undressing Upper body dressing   What is the patient wearing?: Pull over shirt    Upper body assist Assist Level: Maximal Assistance - Patient 25 - 49%    Lower Body Dressing/Undressing Lower body dressing      What is the patient wearing?: Pants     Lower body assist Assist for lower body dressing: 2 Helpers     Toileting Toileting    Toileting assist Assist for toileting: 2 Helpers     Transfers Chair/bed transfer  Transfers assist     Chair/bed transfer assist level: Total Assistance - Patient < 25%(sliding board transfer)     Locomotion Ambulation   Ambulation assist   Ambulation activity did not occur: Safety/medical concerns  Assist level: Total Assistance - Patient < 25% Assistive device: Lite Gait Max distance: 35   Walk 10 feet activity   Assist  Walk 10 feet activity did not occur: Safety/medical concerns  Assist level: Total Assistance - Patient < 25% Assistive device: Lite Gait   Walk 50 feet activity   Assist Walk 50 feet with 2 turns activity did not occur: Safety/medical concerns         Walk 150 feet activity   Assist Walk 150 feet activity did not occur: Safety/medical concerns         Walk 10 feet on uneven  surface  activity   Assist Walk 10 feet on uneven surfaces activity did not occur: Safety/medical concerns         Wheelchair     Assist   Type of Wheelchair: Manual    Wheelchair assist level: Minimal Assistance - Patient > 75% Max wheelchair distance: 120    Wheelchair 50 feet with 2 turns activity    Assist        Assist Level: Minimal Assistance - Patient > 75%   Wheelchair 150 feet activity      Assist Wheelchair 150 feet activity did not occur: Safety/medical concerns        Medical Problem List and Plan: 1.Left side weaknesssecondary to right MCA infarction with right M1 occlusion status post minimal recannulization after mechanical thrombectomy as well as history of left MCA infarction April 2019. Advise 30 day cardiac monitor on discharge  Continue CIR PT, OT  PRAFO/WHO nightly 2. DVT Prophylaxis/Anticoagulation: Subcutaneous Lovenox. 3. Pain Management:Tylenol as needed 4. Mood:Aricept 5 mg daily 5. Neuropsych: This patienthe iscapable of making decisions on herown behalf. 6. Skin/Wound Care:Routine skin checks 7. Fluids/Electrolytes/Nutrition:Routine ins and outs  IVF [place] on 11/27  D2 thins, advance diet as tolerated 8. Cardiomyopathy/chronic combined diastolic congestive heart failure. Monitor for any signs of fluid overload. 9. Hypertension. Coreg 12.5 mg twice a day. Monitor with increased mobility Vitals:   03/04/18 2022 03/05/18 0441  BP: 133/63 116/61  Pulse: 68 (!) 59  Resp: (!) 22 18  Temp: 98.5 F (36.9 C) 97.6 F (36.4 C)  SpO2: 99% 97%   Controlled on 12/8 10. COPD/tobacco abuse. Continue inhalers. Provide counseling 11. Hyperlipidemia. Lipitor 12. Acute on chronic anemia.   Hemoglobin 8.5 on 12/2  Labs ordered for Monday  Continue to monitor 13.  Hx gout left great toe does not appear acutely inflammed - tylenol prn, may need foot orthosis if pt has pain with WB 14.  CKD ?Stage II  Creatinine 1.01 on 12/6  Continue to monitor 15.  Hypoalbuminemia  Supplement initiated on 11/25 16.  Hypokalemia  Potassium 3.4 on 12/6  KCL 67meq qd 12/6  Labs ordered for Monday  LOS: 17 days A FACE TO [place] E Andrey Mccaskill 03/05/2018, 7:22 AM

## 2018-03-06 ENCOUNTER — Inpatient Hospital Stay (HOSPITAL_COMMUNITY): Payer: Medicare Other

## 2018-03-06 ENCOUNTER — Inpatient Hospital Stay (HOSPITAL_COMMUNITY): Payer: Medicare Other | Admitting: Occupational Therapy

## 2018-03-06 ENCOUNTER — Inpatient Hospital Stay (HOSPITAL_COMMUNITY): Payer: Medicare Other | Admitting: Speech Pathology

## 2018-03-06 ENCOUNTER — Inpatient Hospital Stay (HOSPITAL_COMMUNITY): Payer: Medicare Other | Admitting: Physical Therapy

## 2018-03-06 LAB — CBC WITH DIFFERENTIAL/PLATELET
ABS IMMATURE GRANULOCYTES: 0.02 10*3/uL (ref 0.00–0.07)
BASOS PCT: 1 %
Basophils Absolute: 0 10*3/uL (ref 0.0–0.1)
EOS ABS: 0.2 10*3/uL (ref 0.0–0.5)
Eosinophils Relative: 2 %
HEMATOCRIT: 28.9 % — AB (ref 36.0–46.0)
Hemoglobin: 8.6 g/dL — ABNORMAL LOW (ref 12.0–15.0)
Immature Granulocytes: 0 %
LYMPHS ABS: 1.6 10*3/uL (ref 0.7–4.0)
Lymphocytes Relative: 25 %
MCH: 27.2 pg (ref 26.0–34.0)
MCHC: 29.8 g/dL — ABNORMAL LOW (ref 30.0–36.0)
MCV: 91.5 fL (ref 80.0–100.0)
MONO ABS: 0.5 10*3/uL (ref 0.1–1.0)
Monocytes Relative: 7 %
Neutro Abs: 4.3 10*3/uL (ref 1.7–7.7)
Neutrophils Relative %: 65 %
PLATELETS: 287 10*3/uL (ref 150–400)
RBC: 3.16 MIL/uL — ABNORMAL LOW (ref 3.87–5.11)
RDW: 15.1 % (ref 11.5–15.5)
WBC: 6.6 10*3/uL (ref 4.0–10.5)
nRBC: 0 % (ref 0.0–0.2)

## 2018-03-06 LAB — BASIC METABOLIC PANEL
Anion gap: 8 (ref 5–15)
BUN: 21 mg/dL (ref 8–23)
CALCIUM: 8.8 mg/dL — AB (ref 8.9–10.3)
CO2: 24 mmol/L (ref 22–32)
CREATININE: 1.17 mg/dL — AB (ref 0.44–1.00)
Chloride: 112 mmol/L — ABNORMAL HIGH (ref 98–111)
GFR calc Af Amer: 53 mL/min — ABNORMAL LOW (ref >60)
GFR, EST NON AFRICAN AMERICAN: 46 mL/min — AB (ref >60)
GLUCOSE: 88 mg/dL (ref 70–99)
Potassium: 3.7 mmol/L (ref 3.5–5.1)
Sodium: 144 mmol/L (ref 135–145)

## 2018-03-06 NOTE — Progress Notes (Signed)
Occupational Therapy Session Note  Patient Details  Name: Brandy [name] MRN: [id number] Date of Birth: 1940-11-06  Today's Date: 03/06/2018 OT Individual Time: 1000-1100 OT Individual Time Calculation (min): 60 min    Short Term Goals: Week 3:  OT Short Term Goal 1 (Week 3): Pt will complete sliding board transfer from bed to wheelchair with max assist. OT Short Term Goal 2 (Week 3): Pt will maintain dynamic sitting balance during UB bathing with mod assist.  OT Short Term Goal 3 (Week 3): Pt will perform toilet transfer with total assist +2 (pt 25%) using the Stedy.  OT Short Term Goal 4 (Week 3): Pt will complete sit to stand with total assist +2 (pt 35%) during LB selfcare tasks.   Skilled Therapeutic Interventions/Progress Updates:    Pt completed bathing and dressing supine to sit to stand.  Pt completed washing of peri area and buttocks in supine rolling with max assist.  Max assist for dynamic sitting balance secondary to frequent pushing to the left with LOB.  Min assist for static sitting balance overall with RUE placed on the bed to stabilize.  Pt still demonstrates decreased motor planning for keeping upright posture and will fall posteriorly as well with mod assist to regain her balance.  Total +2 (pt 20% for sit to stand during LB dressing, with limited ability to activate the right hip, trunk, and cervical extensors.  Max assist for donning pullover shirt with total assist to donn TEDs and shoes.  Finished session with transfer to the tilt in space wheelchair with total assist using the sliding board.  Pt left supported in chair with spouse present and call button and phone in reach  Therapy Documentation Precautions:  Precautions Precautions: Fall Precaution Comments: mild Lt inattention, left hemiplegia, pusher to the left, motor impersistence Restrictions Weight Bearing Restrictions: No  Pain: Pain Assessment Pain Scale: Faces Pain Score: 0-No pain ADL: See Care Tool  Section for some ADL details  Therapy/Group: Individual Therapy  Ayeden Gladman OTR/L 03/06/2018, 12:44 PM

## 2018-03-06 NOTE — Progress Notes (Signed)
Occupational Therapy Session Note  Patient Details  Name: Brandy [name] MRN: [id number] Date of Birth: November 26, 1940  Today's Date: 03/06/2018 OT Individual Time: 1350-1450 OT Individual Time Calculation (min): 60 min    Short Term Goals: Week 3:  OT Short Term Goal 1 (Week 3): Pt will complete sliding board transfer from bed to wheelchair with max assist. OT Short Term Goal 2 (Week 3): Pt will maintain dynamic sitting balance during UB bathing with mod assist.  OT Short Term Goal 3 (Week 3): Pt will perform toilet transfer with total assist +2 (pt 25%) using the Stedy.  OT Short Term Goal 4 (Week 3): Pt will complete sit to stand with total assist +2 (pt 35%) during LB selfcare tasks.   Skilled Therapeutic Interventions/Progress Updates:    Therapist took pt down to the therapy gym via wheelchair where she transferred to the mat with total assist using the sliding board toward the right pushing side.  Once on the mat, had pt transition to supine with max assist.  Worked on initiation of rolling to both the left and right.  Emphasis on activation of her core to roll.  Max demonstrational cueing for step by step sequencing with max assist to roll to both the right and left.  Pt demonstrates motor planning difficulty and impersistence when attempting to roll to the left and she cannot activate the left abdominals to assist with the roll.  She is very reliant on using the RUE as much as possible to grab and pull her over.  Next, had pt transition to sitting on the right side of the mat.  Had pt take her right forearm to the wall on the right side and attempt to maintain, in order to decrease the amount of pushing.  Incorporated reaching task with the RUE as well in order for pt to reach and grasp clothespin and then place it on the horizontal bar in front of her on the right side.  Overall she was able to maintain static sitting balance during task with min assist.  Mod to max for dynamic sitting balance  as when pt is not stabilizing with the right arm but reaching, she exhibits pushing to the left side.  Finished session with max assist sliding board transfer to the left side.  Pt was taken back to room where she elected to stay up in the wheelchair with spouse present.  Call button and phone in reach with chair alarm in place.      Therapy Documentation Precautions:  Precautions Precautions: Fall Precaution Comments: mild Lt inattention, left hemiplegia, pusher to the left, motor impersistence Restrictions Weight Bearing Restrictions: No  Pain:  No report of pain    Therapy/Group: Individual Therapy  Julian Medina OTR/L 03/06/2018, 4:07 PM

## 2018-03-06 NOTE — Progress Notes (Signed)
Physical Therapy Session Note  Patient Details  Name: Brandy [name] MRN: [id number] Date of Birth: 11/17/1940  Today's Date: 03/06/2018 PT Individual Time: 0762-2633 PT Individual Time Calculation (min): 30 min   Short Term Goals: Week 3:  PT Short Term Goal 1 (Week 3): PT will transfer to and from [facility] with max assist consistently   PT Short Term Goal 2 (Week 3): Pt will maintian sitting balance with min assist x 3 min  PT Short Term Goal 3 (Week 3): Pt will propell WC 161f with min assist using hemi technique  PT Short Term Goal 4 (Week 3): Pt will ambulate at rail in hall x 5 with with max + 2 assist   Skilled Therapeutic Interventions/Progress Updates: Pt presented in TCum[place]chair agreeable to therapy. Pt transported to rehab gym and performed SB transfer to [place] Pt participated in sitting balance activity reaching for clothespins and placing on basketball net. Pt required mod to max multimodal cues to decrease L lateral lean and used mirror feedback with tactile cues to reach midline however pt unable to maintain for more than 5 sec. Pt transferred back to w/c however required maxA x 2 for SB transfer to L. Pt transported back to room and remained in TIS chair with belt alarm on, call Riederer within reach and needs met.       Therapy Documentation Precautions:  Precautions Precautions: Fall Precaution Comments: mild Lt inattention, left hemiplegia, pusher to the left, motor impersistence Restrictions Weight Bearing Restrictions: No General:   Vital Signs: Therapy Vitals Temp: 98 F (36.7 C) Temp Source: Oral Pulse Rate: (!) 59 Resp: 16 BP: 121/71 Patient Position (if appropriate): Sitting Oxygen Therapy SpO2: 100 % O2 Device: Room Air    Therapy/Group: Individual Therapy  Ivon Roedel  Madysen Faircloth, PTA  03/06/2018, 4:17 PM

## 2018-03-06 NOTE — Progress Notes (Signed)
Brandy [name]   Subjective/Complaints: Patient seen laying in bed this morning.  Brandy [name] states Brandy [name] slept well overnight.  Brandy [name] states Brandy [name] had a good weekend.  Brandy [name] states Brandy [name] is sore this morning.  ROS: Denies CP, shortness of breath, nausea, vomiting, diarrhea.  Objective:   No results found. Recent Labs    03/06/18 0738  WBC 6.6  HGB 8.6*  HCT 28.9*  PLT 287   No results for input(s): NA, K, CL, CO2, GLUCOSE, BUN, CREATININE, CALCIUM in the last 72 hours.  Intake/Output Summary (Last 24 hours) at 03/06/2018 0944 Last data filed at 03/05/2018 1730 Gross per 24 hour  Intake 150 ml  Output -  Net 150 ml     Physical Exam: Vital Signs Blood pressure 125/60, pulse 65, temperature 99 F (37.2 C), temperature source Oral, resp. rate 19, height 5\' 4"  (1.626 m), weight 85.7 kg, SpO2 97 %.  Constitutional: No distress . Vital signs reviewed. HENT: Normocephalic.  Atraumatic. Eyes: EOMI. No discharge. Cardiovascular: RRR.  No JVD. Respiratory: CTA bilaterally.  Normal effort. GI: BS +. Non-distended. Musc: Edema left hand and foot. Neurological: Alert. Dysarthria Follows commands. Left inattention Sensation absent to light touch left upper and lower extremity Motor: LUE/LLE: 0/5 proximal to distal, stable Right lower extremity: 2/5 hip flexion, knee extension, 4+/5 ankle dorsiflexion No increase in tone Skin: Skin iswarmand dry.  Assessment/Plan: 1. Functional deficits secondary to right MCA infarction with right M1 occlusion status post minimal recannulization after mechanical thrombectomy as well as history of left MCA infarction April 2019. which require 3+ hours per day of interdisciplinary therapy in a comprehensive inpatient rehab setting.  Physiatrist is providing close team supervision and 24 hour management of active medical problems listed below.  Physiatrist and rehab team continue to assess barriers to  discharge/monitor patient progress toward functional and medical goals  Care Tool:  Bathing    Body parts bathed by patient: Left arm, Chest, Abdomen, Right upper leg, Left upper leg, Right lower leg, Face   Body parts bathed by helper: Front perineal area, Buttocks, Left lower leg, Right arm     Bathing assist Assist Level: 2 Helpers     Upper Body Dressing/Undressing Upper body dressing   What is the patient wearing?: Pull over shirt    Upper body assist Assist Level: Maximal Assistance - Patient 25 - 49%    Lower Body Dressing/Undressing Lower body dressing      What is the patient wearing?: Pants     Lower body assist Assist for lower body dressing: 2 Helpers     Toileting Toileting    Toileting assist Assist for toileting: 2 Helpers     Transfers Chair/bed transfer  Transfers assist     Chair/bed transfer assist level: Total Assistance - Patient < 25%(sliding board transfer)     Locomotion Ambulation   Ambulation assist   Ambulation activity did not occur: Safety/medical concerns  Assist level: Total Assistance - Patient < 25% Assistive device: Lite Gait Max distance: 35   Walk 10 feet activity   Assist  Walk 10 feet activity did not occur: Safety/medical concerns  Assist level: Total Assistance - Patient < 25% Assistive device: Lite Gait   Walk 50 feet activity   Assist Walk 50 feet with 2 turns activity did not occur: Safety/medical concerns         Walk 150 feet activity   Assist Walk 150 feet activity did not occur: Safety/medical concerns  Walk 10 feet on uneven surface  activity   Assist Walk 10 feet on uneven surfaces activity did not occur: Safety/medical concerns         Wheelchair     Assist   Type of Wheelchair: Manual    Wheelchair assist level: Minimal Assistance - Patient > 75% Max wheelchair distance: 120    Wheelchair 50 feet with 2 turns activity    Assist        Assist  Level: Minimal Assistance - Patient > 75%   Wheelchair 150 feet activity     Assist Wheelchair 150 feet activity did not occur: Safety/medical concerns        Medical Problem List and Plan: 1.Left side weaknesssecondary to right MCA infarction with right M1 occlusion status post minimal recannulization after mechanical thrombectomy as well as history of left MCA infarction April 2019. Advise 30 day cardiac monitor on discharge  Continue CIR  PRAFO/WHO nightly 2. DVT Prophylaxis/Anticoagulation: Subcutaneous Lovenox. 3. Pain Management:Tylenol as needed 4. Mood:Aricept 5 mg daily 5. Neuropsych: This patienthe iscapable of making decisions on herown behalf. 6. Skin/Wound Care:Routine skin checks 7. Fluids/Electrolytes/Nutrition:Routine ins and outs  IVF [place] on 11/27  D2 thins, advance diet as tolerated 8. Cardiomyopathy/chronic combined diastolic congestive heart failure. Monitor for any signs of fluid overload. 9. Hypertension. Coreg 12.5 mg twice a day. Monitor with increased mobility Vitals:   03/06/18 0509 03/06/18 0812  BP: 128/60 125/60  Pulse: 63 65  Resp: 19   Temp: 99 F (37.2 C)   SpO2: 97%    Controlled on 12/9 10. COPD/tobacco abuse. Continue inhalers. Provide counseling 11. Hyperlipidemia. Lipitor 12. Acute on chronic anemia.   Hemoglobin 8.6 on 12/9  Continue to monitor 13.  Hx gout left great toe does not appear acutely inflammed - tylenol prn, may need foot orthosis if pt has pain with WB 14.  CKD ?Stage II  Creatinine 1.01 on 12/6, labs pending for this morning  Continue to monitor 15.  Hypoalbuminemia  Supplement initiated on 11/25 16.  Hypokalemia  Potassium 3.4 on 12/6  KCL 38meq qd 12/6  Labs pending  LOS: 18 days A FACE TO FACE EVALUATION WAS PERFORMED  Brandy [name] [name] 03/06/2018, 9:44 AM

## 2018-03-06 NOTE — Progress Notes (Signed)
Speech Language Pathology Daily Session Note  Patient Details  Name: Brandy [name] MRN: [id number] Date of Birth: [birth year]  Today's Date: 03/06/2018 SLP Individual Time: 1130-1200 SLP Individual Time Calculation (min): 30 min  Short Term Goals: Week 3: SLP Short Term Goal 1 (Week 3): Pt will consume trials of dys 3 textures and thin liquids with supervision cues for use of swallowing precautions and minimal overt s/s of aspiration.   SLP Short Term Goal 2 (Week 3): Pt will sustain her attention to basic, familiar tasks for 15 minutes with Min A cues for redirection.   SLP Short Term Goal 3 (Week 3): Pt will locate items to the left of midline during functional tasks in >50% of opportunities with supervision cues.   SLP Short Term Goal 4 (Week 3): Pt will complete basic, familiar tasks with Mod cues for functional problem solving.   SLP Short Term Goal 5 (Week 3): Pt will recall basic, daily information with supervision verbal cues.   SLP Short Term Goal 6 (Week 3): Pt will increase vocal intensity to achieve intelligibility at the sentence level with min assist to achieve > 75% intelligibility.    Skilled Therapeutic Interventions: Skilled treatment session focused on dysphagia and speech goals. Upon arrival, patient was consuming her lunch meal of Dys. 2 textures with thin liquids. SLP facilitated session by providing Min A verbal cues for thorough and efficient mastication of Dys. 2 textures, to self-monitor and correct left anterior spillage of saliva, and to consume small sips of thin liquids via cup. Recommend patient continue current diet. SLP also participated in a basic conversation with patient in which she was ~90% intelligible at the sentence level with Min A verbal cues for use of an increased vocal intensity. Patient left upright in wheelchair with all needs within reach and family present. Continue with current plan of care.      Pain Pain Assessment Pain Scale: 0-10 Pain  Score: 0-No pain  Therapy/Group: Individual Therapy  Keylon Labelle 03/06/2018, 12:23 PM

## 2018-03-07 ENCOUNTER — Inpatient Hospital Stay (HOSPITAL_COMMUNITY): Payer: Medicare Other | Admitting: Occupational Therapy

## 2018-03-07 ENCOUNTER — Encounter (HOSPITAL_COMMUNITY): Payer: Medicare Other | Admitting: Psychology

## 2018-03-07 ENCOUNTER — Inpatient Hospital Stay (HOSPITAL_COMMUNITY): Payer: Medicare Other | Admitting: Speech Pathology

## 2018-03-07 ENCOUNTER — Inpatient Hospital Stay (HOSPITAL_COMMUNITY): Payer: Medicare Other

## 2018-03-07 ENCOUNTER — Inpatient Hospital Stay (HOSPITAL_COMMUNITY): Payer: Medicare Other | Admitting: Physical Therapy

## 2018-03-07 NOTE — Progress Notes (Signed)
Occupational Therapy Session Note  Patient Details  Name: Brandy [name] MRN: [id number] Date of Birth: 03-22-1941  Today's Date: 03/07/2018 OT Individual Time: 1002-1103 OT Individual Time Calculation (min): 61 min    Short Term Goals: Week 3:  OT Short Term Goal 1 (Week 3): Pt will complete sliding board transfer from bed to wheelchair with max assist. OT Short Term Goal 2 (Week 3): Pt will maintain dynamic sitting balance during UB bathing with mod assist.  OT Short Term Goal 3 (Week 3): Pt will perform toilet transfer with total assist +2 (pt 25%) using the Stedy.  OT Short Term Goal 4 (Week 3): Pt will complete sit to stand with total assist +2 (pt 35%) during LB selfcare tasks.   Skilled Therapeutic Interventions/Progress Updates:    Pt completed transfer from supine to sit EOB with max assist and max demonstrational cueing for sequencing rolling to the right side.  Total assist needed for sliding board transfer from bed to wheelchair as well.  Took pt down to use the [employer] for visual scanning to the left, sustained attention, and sitting balance.  Pt completed several intervals of one min with overall max assist for dynamic sitting balance secondary to LOB to the left and posteriorly.  She needed anywhere from 5-20 seconds between each light, depending on attention level.  After pt was cued to begin she could only maintain sustained attention to the task for 15 seconds before she needed cueing to continue, secondary to her slumping back in her chair.  Emphasized lateral reaching to the right to increased weightshift over the pushing side, which pt still demonstrates moderate difficulty completing secondary to perceptual deficits.  Finished session with return back to the room in wheelchair with spouse in room and call button in reach.  Chair alarm belt in place as well.   Therapy Documentation Precautions:  Precautions Precautions: Fall Precaution Comments: mild Lt inattention,  left hemiplegia, pusher to the left, motor impersistence Restrictions Weight Bearing Restrictions: No  Pain: Pain Assessment Pain Scale: Faces Pain Score: 0-No pain  Therapy/Group: Individual Therapy  Montina Dorrance OTR/L 03/07/2018, 12:35 PM

## 2018-03-07 NOTE — Progress Notes (Signed)
Occupational Therapy Session Note  Patient Details  Name: Brandy [name] MRN: [id number] Date of Birth: July 16, 1940  Today's Date: 03/07/2018 OT Individual Time: 1165-7903 OT Individual Time Calculation (min): 45 min    Short Term Goals: Week 3:  OT Short Term Goal 1 (Week 3): Pt will complete sliding board transfer from bed to wheelchair with max assist. OT Short Term Goal 2 (Week 3): Pt will maintain dynamic sitting balance during UB bathing with mod assist.  OT Short Term Goal 3 (Week 3): Pt will perform toilet transfer with total assist +2 (pt 25%) using the Stedy.  OT Short Term Goal 4 (Week 3): Pt will complete sit to stand with total assist +2 (pt 35%) during LB selfcare tasks.   Skilled Therapeutic Interventions/Progress Updates:    Session focused on bed level b/d tasks. Pt received supine in bed with no c/o pain. Pt reported needing to use bed pan. Total A provided for doffing pants and brief. Brief was saturated and pt unable to report feeling need to urinate. Pt rolled R with mod A, L with max A. Pt had no void on bed pan despite reporting she had done so. Pt able to complete anterior peri hygiene, requiring max A for all further LB bathing. Min A  For UB bathing. Pt able to recall hemi dressing technique, however still initiated UB dressing incorrectly, requiring max A to don shirt. Pt encouraged to apply lotion to L hemi UE to increase tactile input. Pt was left supine with all needs met and husband present.    Therapy Documentation Precautions:  Precautions Precautions: Fall Precaution Comments: mild Lt inattention, left hemiplegia, pusher to the left, motor impersistence Restrictions Weight Bearing Restrictions: No Vital Signs: Therapy Vitals Pulse Rate: 71 BP: 133/72 Oxygen Therapy SpO2: 99 % O2 Device: Room Air Pain: Pain Assessment Pain Scale: 0-10 Pain Score: 0-No pain   Therapy/Group: Individual Therapy  Curtis Sites 03/07/2018, 10:34 AM

## 2018-03-07 NOTE — Progress Notes (Signed)
Speech Language Pathology Daily Session Note  Patient Details  Name: Brandy [name] MRN: [id number] Date of Birth: 1940-09-09  Today's Date: 03/07/2018 SLP Individual Time: 1135-1200 SLP Individual Time Calculation (min): 25 min  Short Term Goals: Week 3: SLP Short Term Goal 1 (Week 3): Pt will consume trials of dys 3 textures and thin liquids with supervision cues for use of swallowing precautions and minimal overt s/s of aspiration.   SLP Short Term Goal 2 (Week 3): Pt will sustain her attention to basic, familiar tasks for 15 minutes with Min A cues for redirection.   SLP Short Term Goal 3 (Week 3): Pt will locate items to the left of midline during functional tasks in >50% of opportunities with supervision cues.   SLP Short Term Goal 4 (Week 3): Pt will complete basic, familiar tasks with Mod cues for functional problem solving.   SLP Short Term Goal 5 (Week 3): Pt will recall basic, daily information with supervision verbal cues.   SLP Short Term Goal 6 (Week 3): Pt will increase vocal intensity to achieve intelligibility at the sentence level with min assist to achieve > 75% intelligibility.    Skilled Therapeutic Interventions:  Pt was seen for skilled ST targeting cognitive goals.  SLP facilitated the session with basic medication and money management tasks from the ALFA standardized cognitive assessment to address problem solving goals.  Pt selectively attended task for its duration in a mildly distracting environment (~20 min) with supervision cues for redirection.  She needed intermittent min assist verbal cues for task organization and error awareness when counting money and making change, as well as when reading medication labels and organizing pills into a 4x daily medication chart.  Pt was left in wheelchair with husband at bedside.  Continue per current plan of care.    Pain Pain Assessment Pain Scale: 0-10 Pain Score: 0-No pain  Therapy/Group: Individual Therapy  Haiven Nardone,  Selinda Orion 03/07/2018, 3:30 PM

## 2018-03-07 NOTE — Progress Notes (Signed)
[facility] PHYSICAL MEDICINE & REHABILITATION PROGRESS NOTE   Subjective/Complaints: Patient seen laying in bed this morning.  She states she slept well overnight.  She denies complaints.  ROS: Denies CP, shortness of breath, nausea, vomiting, diarrhea.  Objective:   No results found. Recent Labs    03/06/18 0738  WBC 6.6  HGB 8.6*  HCT 28.9*  PLT 287   Recent Labs    03/06/18 0738  NA 144  K 3.7  CL 112*  CO2 24  GLUCOSE 88  BUN 21  CREATININE 1.17*  CALCIUM 8.8*    Intake/Output Summary (Last 24 hours) at 03/07/2018 0720 Last data filed at 03/06/2018 1856 Gross per 24 hour  Intake 360 ml  Output -  Net 360 ml     Physical Exam: Vital Signs Blood pressure 131/73, pulse 81, temperature 99.8 F (37.7 C), temperature source Oral, resp. rate 18, height 5\' 4"  (1.626 m), weight 85.7 kg, SpO2 100 %.  Constitutional: No distress . Vital signs reviewed. HENT: Normocephalic.  Atraumatic. Eyes: EOMI. No discharge. Cardiovascular: RRR.  No JVD. Respiratory: CTA bilaterally.  Normal effort. GI: BS +. Non-distended. Musc: Edema left hand and foot. Neurological: Alert. Dysarthria Follows commands. Left inattention Sensation absent to light touch left upper and lower extremity Motor: LUE/LLE: 0/5 proximal to distal, unchanged Right lower extremity: 2/5 hip flexion, knee extension, 4+/5 ankle dorsiflexion, unchanged No increase in tone Skin: Skin iswarmand dry.  Assessment/Plan: 1. Functional deficits secondary to right MCA infarction with right M1 occlusion status post minimal recannulization after mechanical thrombectomy as well as history of left MCA infarction April 2019. which require 3+ hours per day of interdisciplinary therapy in a comprehensive inpatient rehab setting.  Physiatrist is providing close team supervision and 24 hour management of active medical problems listed below.  Physiatrist and rehab team continue to assess barriers to  discharge/monitor patient progress toward functional and medical goals  Care Tool:  Bathing    Body parts bathed by patient: Left arm, Chest, Abdomen, Right upper leg, Left upper leg, Right lower leg, Face   Body parts bathed by helper: Front perineal area, Buttocks, Left lower leg, Right arm     Bathing assist Assist Level: 2 Helpers     Upper Body Dressing/Undressing Upper body dressing   What is the patient wearing?: Pull over shirt    Upper body assist Assist Level: Maximal Assistance - Patient 25 - 49%    Lower Body Dressing/Undressing Lower body dressing      What is the patient wearing?: Pants     Lower body assist Assist for lower body dressing: 2 Helpers     Toileting Toileting    Toileting assist Assist for toileting: 2 Helpers     Transfers Chair/bed transfer  Transfers assist     Chair/bed transfer assist level: Total Assistance - Patient < 25%     Locomotion Ambulation   Ambulation assist   Ambulation activity did not occur: Safety/medical concerns  Assist level: Total Assistance - Patient < 25% Assistive device: Lite Gait Max distance: 35   Walk 10 feet activity   Assist  Walk 10 feet activity did not occur: Safety/medical concerns  Assist level: Total Assistance - Patient < 25% Assistive device: Lite Gait   Walk 50 feet activity   Assist Walk 50 feet with 2 turns activity did not occur: Safety/medical concerns         Walk 150 feet activity   Assist Walk 150 feet activity did not occur:  Safety/medical concerns         Walk 10 feet on uneven surface  activity   Assist Walk 10 feet on uneven surfaces activity did not occur: Safety/medical concerns         Wheelchair     Assist   Type of Wheelchair: Manual    Wheelchair assist level: Minimal Assistance - Patient > 75% Max wheelchair distance: 120    Wheelchair 50 feet with 2 turns activity    Assist        Assist Level: Minimal Assistance  - Patient > 75%   Wheelchair 150 feet activity     Assist Wheelchair 150 feet activity did not occur: Safety/medical concerns        Medical Problem List and Plan: 1.Left side weaknesssecondary to right MCA infarction with right M1 occlusion status post minimal recannulization after mechanical thrombectomy as well as history of left MCA infarction April 2019. Advise 30 day cardiac monitor on discharge  Continue CIR  PRAFO/WHO nightly 2. DVT Prophylaxis/Anticoagulation: Subcutaneous Lovenox. 3. Pain Management:Tylenol as needed 4. Mood:Aricept 5 mg daily 5. Neuropsych: This patienthe iscapable of making decisions on herown behalf. 6. Skin/Wound Care:Routine skin checks 7. Fluids/Electrolytes/Nutrition:Routine ins and outs  IVF [place] on 11/27  D2 thins, advance diet as tolerated 8. Cardiomyopathy/chronic combined diastolic congestive heart failure. Monitor for any signs of fluid overload. 9. Hypertension. Coreg 12.5 mg twice a day. Monitor with increased mobility Vitals:   03/06/18 1924 03/07/18 0556  BP: 124/60 131/73  Pulse: 64 81  Resp: 16 18  Temp: 98.8 F (37.1 C) 99.8 F (37.7 C)  SpO2: 100% 100%   Controlled on 12/10 10. COPD/tobacco abuse. Continue inhalers. Provide counseling 11. Hyperlipidemia. Lipitor 12. Acute on chronic anemia.   Hemoglobin 8.6 on 12/9  Continue to monitor 13.  Hx gout left great toe does not appear acutely inflammed - tylenol prn, may need foot orthosis if pt has pain with WB 14.  CKD ?Stage II  Creatinine 1.17 on 12/9  Encourage fluids  Continue to monitor 15.  Hypoalbuminemia  Supplement initiated on 11/25 16.  Hypokalemia  Potassium 3.7 on 12/9  KCL 20meq qd 12/6  LOS: 19 days A FACE TO FACE EVALUATION WAS PERFORMED  Kaitlinn Iversen [name] 03/07/2018, 7:20 AM

## 2018-03-07 NOTE — Patient Care Conference (Signed)
Inpatient RehabilitationTeam Conference and Plan of Care Update Date: 03/07/2018   Time: 2:30 PM    Patient Name: Brandy [name]      Medical Record Number: [id number]  Date of Birth: June 09, 1940 Sex: Female         Room/Bed: 4M02C/4M02C-01 Payor Info: Payor: [employer] MEDICARE / Plan: [employer] MEDICARE / Product Type: *No Product type* /    Admitting Diagnosis: stroke  Admit Date/Time:  02/16/2018  5:18 PM Admission Comments: No comment available   Primary Diagnosis:  <principal problem not specified> Principal Problem: <principal problem not specified>  Patient Active Problem List   Diagnosis Date Noted  . Left hemiplegia ([place])   . Chronic kidney disease   . Hypokalemia   . Acute on chronic anemia   . Hypoalbuminemia due to protein-calorie malnutrition ([place])   . Hemiparesis affecting left side as late effect of stroke ([place]) 02/16/2018  . Left-sided neglect 02/16/2018  . Right middle cerebral artery stroke ([place]) 02/16/2018  . Acute embolic stroke ([place])   . Acute right MCA stroke ([place])   . History of CVA with residual deficit   . Benign essential HTN   . Chronic combined systolic and diastolic congestive heart failure ([place])   . History of fusion of cervical spine   . Stage 3 chronic kidney disease ([place])   . Acute blood loss anemia   . Stroke ([place]) 02/10/2018  . Middle cerebral artery embolism, right 02/10/2018  . Health care maintenance 11/01/2017  . Non-ischemic cardiomyopathy ([place]) 09/14/2017  . Precordial chest pain 09/14/2017  . B12 deficiency 09/13/2017  . Tachycardia 09/13/2017  . Acute ischemic stroke ([place])   . Seasonal allergies 07/13/2017  . TIA (transient ischemic attack) 07/12/2017  . Anemia 07/12/2017  . Pneumonia 06/02/2017  . Pleural effusion 06/02/2017  . Acute on chronic systolic congestive heart failure ([place])   . Elevated troponin   . Other fatigue 05/04/2017  . Dyspnea 05/04/2017  . Pseudophakia of both eyes 08/06/2016  . Chronic combined  systolic and diastolic CHF (congestive heart failure) ([place]) 07/09/2016  . Hypertensive heart disease   . Hypertension   . Nuclear sclerosis, bilateral 05/24/2016  . HTN (hypertension) 01/22/2013  . TOBACCO ABUSE 02/18/2008  . CARDIOMYOPATHY 02/18/2008  . Diverticulosis of colon 02/18/2008  . OSTEOPENIA 02/18/2008  . Disorder of bone and cartilage 02/18/2008    Expected Discharge Date: Expected Discharge Date: (3 weeks vs. SNF)  Team Members Present: Physician leading conference: Dr. Delice Lesch Social Worker Present: Alfonse Alpers, LCSW Nurse Present: Benjie Karvonen, RN PT Present: Barrie Folk, PT;Rosita Dechalus, PTA OT Present: Clyda Greener, OT SLP Present: Charolett Bumpers, SLP;Courtney Rollene Rotunda, SLP PPS Coordinator present : Daiva Nakayama, RN, CRRN;Melissa Gertie Fey     Current Status/Progress Goal Weekly Team Focus  Medical   Left side weakness secondary to right MCA infarction with right M1 occlusion status post minimal recannulization after mechanical thrombectomy as well as history of left MCA infarction April 2019. Advise 30 day cardiac monitor on discharge  Improve mobility, swallowing, transfers, self-care, hypokalemia  See above   Bowel/Bladder   Incontinent of B/B with incont. care, LBM 02/26/2018, has po and prn laxatives/stool softner schedule orders in place  Restore Continence   Assess QS and prn toileting needs, initiate bowel program. Educate family if plans are to discharge home   Swallow/Nutrition/ Hydration   Dys 2 and thin via regular cup, Min A, husband signed off for supervision  supervision   dys 3 trials   ADL's  max assist for UB selfcare sitting unsupported EOB, Total +2 LB selfcare sit to stand. Max assist for dynamic sitting balance with increased pushing to the left hemi side with LOB immediately. LUE Brunnstrum stage II with slight tone noted in the shoulder, but no active movement in the hand or arm at this time.  mod to max assist  neuromuscular  re-education, selfcare retraining, transfer training, balance retraining, splinting therapeutic exercise, positioning, edema control   Mobility   max A bed mobility, max x 2 SB transfers, continues to demonstrate limited tolerance to maintaining midline orientation.  mod assist transfres. max assist gait for short distances. min assist WC mobility with hemi technique.   sitting balance, decreasing pushing tendencies.   Communication   Min A, phrase  supervision  speech intelligibility strategies and education   Safety/Cognition/ Behavioral Observations  Mod-Min A  min assist   Problem solving, attention, memory, awareness, left scan   Pain   Verbalize occasional discomfort - headache or generalized pain, PRN Tylenol  ,< 2  Assess QS and prn   Skin   no skin issues, monitor closely and address immediately  no report skin tears or injuries while o rehab  Assess/Evaluate and Educate, reposition Q2 HRS and PRN    Rehab Goals Patient on target to meet rehab goals: Yes Rehab Goals Revised: goals have been downgraded from original goals *See Care Plan and progress notes for long and short-term goals.     Barriers to Discharge  Current Status/Progress Possible Resolutions Date Resolved   Physician    Medical stability;Nutrition means;Lack of/limited family support     see above  Therapies, follow labs, advance diet as tolerated      Nursing                  PT  Inaccessible home environment;Decreased caregiver support;Home environment access/layout                 OT                  SLP                SW                Discharge Planning/Teaching Needs:  [name] husband wants what is best for the pt and is willing to look at SNF care as a step in between CIR and home, as he is not going to be able to care for pt on his own.  defer to next venue   Team Discussion:  Pt with no medical changes.  She is incontinent, her skin is okay, and she is perking up more today.  Pt is min for UB  bathing, max for bathing UB.  Pt is using slide board with total assistance.  Pt has no movement in left arm, has a little tone.  Pt is still max +2 for sit to stand.  Pt is max A for bed mobility; working on sitting balance and midline orientation - pt can correct 50% of the time.  Pt is on D2, thin liquids with D3 trials and she's doing well.  She communicates better when alert and ST is working on cognition, basic problem solving, and sustained attention.  Pt with supervision goals for eating and communication.    Revisions to Treatment Plan:  none    Continued Need for Acute Rehabilitation Level of Care: The patient requires daily medical management by a physician with specialized training in  physical medicine and rehabilitation for the following conditions: Daily direction of a multidisciplinary physical rehabilitation program to ensure safe treatment while eliciting the highest outcome that is of practical value to the patient.: Yes Daily medical management of patient stability for increased activity during participation in an intensive rehabilitation regime.: Yes Daily analysis of laboratory values and/or radiology reports with any subsequent need for medication adjustment of medical intervention for : Neurological problems;Blood pressure problems;Renal problems;Other   I attest that I was present, lead the team conference, and concur with the assessment and plan of the team.   Jacai Kipp, Silvestre Mesi 03/04/2018, 10:42 AM

## 2018-03-07 NOTE — Progress Notes (Signed)
Social Work Patient ID: Brandy [name], female   DOB: 1940/04/01, 77 y.o.   MRN: [id number]   CSW met with pt on 03-02-18 to update her on team conference (03-01-18) discussion and team's recommendation for pt to go to SNF for more rehabilitation, as pt would require 2 caregivers at home and lift equipment.  Husband would not be able to manage pt on his own.  Husband was not present, so CSW spoke with him on 03-03-18 to share the above information and he agreed.  He requested list of SNFs and asked that [facility] call pt's brother, Dr. Tamala Julian, as he returned to [place] prior to CSW's visit.  CSW also spoke with pt's brother at length about pt's progress, criteria for CIR, SNF care, insurance, etc.  Dr. Tamala Julian requested a copy of the SNF list to be emailed or faxed to him.  CSW did this today and also gave pt's husband a list today.  He plans to look over facilities and discuss with his dtr.  CSW will f/u with husband regarding facilities he wants to pursue and in the meantime, CSW will prepare FL2 and have it ready to send to SNFs.  Pt is aware of the above and understands, she just keeps stating "I want to go home."  It seems she feels leaving the hospital setting will put her one step closer to home.  CSW will begin SNF search.  Dtr also has FMLA paperwork that needs to be completed and signed.  CSW suggested that she fax or scan/email it to CSW so that CSW can make sure this gets done.  [name] husband to let the dtr know.  CSW remains available to assist as needed.

## 2018-03-07 NOTE — Progress Notes (Signed)
Physical Therapy Session Note  Patient Details  Name: Brandy [name] MRN: [id number] Date of Birth: October 11, 1940  Today's Date: 03/07/2018 PT Individual Time: 1400-1500 PT Individual Time Calculation (min): 60 min   Short Term Goals: Week 3:  PT Short Term Goal 1 (Week 3): PT will transfer to and from [facility] with max assist consistently   PT Short Term Goal 2 (Week 3): Pt will maintian sitting balance with min assist x 3 min  PT Short Term Goal 3 (Week 3): Pt will propell WC 153f with min assist using hemi technique  PT Short Term Goal 4 (Week 3): Pt will ambulate at rail in hall x 5 with with max + 2 assist   Skilled Therapeutic Interventions/Progress Updates: Pt presented in TIS agreeable to therapy. Pt transported to day room and participated in upright tolerance in standing frame. Pt was able to tolerate x 2 bouts of standing in frame. Pt required maxA for scooting forward in TIS with max multimodal cues for sequencing. Pt required max to total A achieving erect posture and increasing wt on RLE. Pt able to maintain erect posture for approx 10sec before increased L lateral lean appeared. Pt was also able to increase RLE wt shifting on second bout. Pt then transported to rehab gym and participated in SB transfer to mat requiring maxA x 1 with PTA providing multimodal cues for leaning forward and placement of RUE. On mat pt participated in reaching activities with emphasis reaching to R (towards) wall. PTA noted that after activity pt was able to maintain erect sitting balance while engaging in conversation for approx 5 min without vc's for correction. Pt then returned to TIS in SB requiring total A and transported back to room. Pt remained in chair with belt alarm on, call Arduini within reach, and needs met.       Therapy Documentation Precautions:  Precautions Precautions: Fall Precaution Comments: mild Lt inattention, left hemiplegia, pusher to the left, motor impersistence Restrictions Weight  Bearing Restrictions: No General:   Vital Signs:   Pain: Pain Assessment Pain Scale: 0-10 Pain Score: 0-No pain   Therapy/Group: Individual Therapy  Adair Lemar  Nicandro Perrault, PTA  03/07/2018, 3:58 PM

## 2018-03-08 ENCOUNTER — Inpatient Hospital Stay (HOSPITAL_COMMUNITY): Payer: Medicare Other | Admitting: Occupational Therapy

## 2018-03-08 ENCOUNTER — Inpatient Hospital Stay (HOSPITAL_COMMUNITY): Payer: Medicare Other | Admitting: Speech Pathology

## 2018-03-08 ENCOUNTER — Inpatient Hospital Stay (HOSPITAL_COMMUNITY): Payer: Medicare Other | Admitting: Physical Therapy

## 2018-03-08 DIAGNOSIS — I63311 Cerebral infarction due to thrombosis of right middle cerebral artery: Secondary | ICD-10-CM

## 2018-03-08 NOTE — Plan of Care (Signed)
PT goals downgraded due to lack of progress towards long term goals secondary to continued pushers syndrome and hemiplegia. See Care Plan for details.

## 2018-03-08 NOTE — Progress Notes (Signed)
Speech Language Pathology Daily Session Note  Patient Details  Name: Brandy [name] MRN: [id number] Date of Birth: 03-Nov-1940  Today's Date: 03/08/2018 SLP Individual Time: 1310-1400; 1500-1540 SLP Individual Time Calculation (min): 50 min; 40 min  Short Term Goals: Week 3: SLP Short Term Goal 1 (Week 3): Pt will consume trials of dys 3 textures and thin liquids with supervision cues for use of swallowing precautions and minimal overt s/s of aspiration.   SLP Short Term Goal 2 (Week 3): Pt will sustain her attention to basic, familiar tasks for 15 minutes with Min A cues for redirection.   SLP Short Term Goal 3 (Week 3): Pt will locate items to the left of midline during functional tasks in >50% of opportunities with supervision cues.   SLP Short Term Goal 4 (Week 3): Pt will complete basic, familiar tasks with Mod cues for functional problem solving.   SLP Short Term Goal 5 (Week 3): Pt will recall basic, daily information with supervision verbal cues.   SLP Short Term Goal 6 (Week 3): Pt will increase vocal intensity to achieve intelligibility at the sentence level with min assist to achieve > 75% intelligibility.    Skilled Therapeutic Interventions:  Session 1:  Pt was seen for skilled ST targeting cognitive goals.  Pt was received in wheelchair, awake, alert, and agreeable to participating in [place].  SLP facilitated the session with a peg board task to address visual scanning, sustained attention to task, and functional problem solving.  Pt was able to recreate basic patterns from images for 100% accuracy with mod I; however, as patterns increased in complexity, pt needed up to max assist due to fluctuating attention to her left side, increased burden of attention to task, and increased problem solving requirement when errors were made.  Pt was returned to room and left in wheelchair with chair alarm set and family at bedside.  Continue per current plan of care.   Session 2: Pt was seen for  skilled ST targeting communication goals.  SLP facilitated the session with a verbal sentence generation task to address education and carryover of compensatory intelligibility strategies.  Pt needed overall min assist-supervision cues to slow rate and increase volume to achieve intelligibility at the sentence level.   Pt was left in wheelchair with call [name] within reach.  Continue per current plan of care.     Pain Pain Assessment Pain Scale: 0-10 Pain Score: 0-No pain  Therapy/Group: Individual Therapy  Antolin Belsito, Selinda Orion 03/08/2018, 3:51 PM

## 2018-03-08 NOTE — Progress Notes (Signed)
[facility] PHYSICAL MEDICINE & REHABILITATION PROGRESS NOTE   Subjective/Complaints: No new issues. Comfortable in bed. Left arm and leg still sensitive with movement. Husband at bedside  ROS: Patient denies fever, rash, sore throat, blurred vision, nausea, vomiting, diarrhea, cough, shortness of breath or chest pain, joint or back pain, headache, or mood change.    Objective:   No results found. Recent Labs    03/06/18 0738  WBC 6.6  HGB 8.6*  HCT 28.9*  PLT 287   Recent Labs    03/06/18 0738  NA 144  K 3.7  CL 112*  CO2 24  GLUCOSE 88  BUN 21  CREATININE 1.17*  CALCIUM 8.8*    Intake/Output Summary (Last 24 hours) at 03/08/2018 0816 Last data filed at 03/07/2018 1800 Gross per 24 hour  Intake 360 ml  Output -  Net 360 ml     Physical Exam: Vital Signs Blood pressure 122/73, pulse 71, temperature 98.8 F (37.1 C), temperature source Oral, resp. rate 17, height 5\' 4"  (1.626 m), weight 85.7 kg, SpO2 97 %.  Constitutional: No distress . Vital signs reviewed. HEENT: EOMI, oral membranes moist Neck: supple Cardiovascular: RRR without murmur. No JVD    Respiratory: CTA Bilaterally without wheezes or rales. Normal effort    GI: BS +, non-tender, non-distended  Musc: Edema left hand and foot. Neurological: Alert. Dysarthria. Voice louder Follows commands. Left inattention Sensation absent to light touch left upper and lower extremity. Sensitive with movement though Motor: LUE/LLE: 0/5 proximal to distal, no change Right lower extremity: 2/5 hip flexion, knee extension, 4+/5 ankle dorsiflexion,stable No increase in tone Skin: Skin iswarmand dry.  Assessment/Plan: 1. Functional deficits secondary to right MCA infarction with right M1 occlusion status post minimal recannulization after mechanical thrombectomy as well as history of left MCA infarction April 2019. which require 3+ hours per day of interdisciplinary therapy in a comprehensive inpatient rehab  setting.  Physiatrist is providing close team supervision and 24 hour management of active medical problems listed below.  Physiatrist and rehab team continue to assess barriers to discharge/monitor patient progress toward functional and medical goals  Care Tool:  Bathing    Body parts bathed by patient: Left arm, Chest, Abdomen, Face, Front perineal area   Body parts bathed by helper: Buttocks, Left lower leg, Right arm, Left upper leg, Right lower leg, Right upper leg     Bathing assist Assist Level: Maximal Assistance - Patient 24 - 49%     Upper Body Dressing/Undressing Upper body dressing   What is the patient wearing?: Pull over shirt    Upper body assist Assist Level: Maximal Assistance - Patient 25 - 49%    Lower Body Dressing/Undressing Lower body dressing      What is the patient wearing?: Pants     Lower body assist Assist for lower body dressing: Total Assistance - Patient < 25%     Toileting Toileting    Toileting assist Assist for toileting: Total Assistance - Patient < 25%     Transfers Chair/bed transfer  Transfers assist     Chair/bed transfer assist level: Total Assistance - Patient < 25%(sliding board)     Locomotion Ambulation   Ambulation assist   Ambulation activity did not occur: Safety/medical concerns  Assist level: Total Assistance - Patient < 25% Assistive device: Lite Gait Max distance: 35   Walk 10 feet activity   Assist  Walk 10 feet activity did not occur: Safety/medical concerns  Assist level: Total Assistance -  Patient < 25% Assistive device: Lite Gait   Walk 50 feet activity   Assist Walk 50 feet with 2 turns activity did not occur: Safety/medical concerns         Walk 150 feet activity   Assist Walk 150 feet activity did not occur: Safety/medical concerns         Walk 10 feet on uneven surface  activity   Assist Walk 10 feet on uneven surfaces activity did not occur: Safety/medical  concerns         Wheelchair     Assist   Type of Wheelchair: Manual    Wheelchair assist level: Minimal Assistance - Patient > 75% Max wheelchair distance: 120    Wheelchair 50 feet with 2 turns activity    Assist        Assist Level: Minimal Assistance - Patient > 75%   Wheelchair 150 feet activity     Assist Wheelchair 150 feet activity did not occur: Safety/medical concerns        Medical Problem List and Plan: 1.Left side weaknesssecondary to right MCA infarction with right M1 occlusion status post minimal recannulization after mechanical thrombectomy as well as history of left MCA infarction April 2019. Advise 30 day cardiac monitor on discharge  -Interdisciplinary Team Conference today    PRAFO/WHO nightly 2. DVT Prophylaxis/Anticoagulation: Subcutaneous Lovenox. 3. Pain Management:Tylenol as needed  -?gabapentin trial 4. Mood:Aricept 5 mg daily 5. Neuropsych: This patienthe iscapable of making decisions on herown behalf. 6. Skin/Wound Care:Routine skin checks 7. Fluids/Electrolytes/Nutrition:Routine ins and outs  IVF [place] on 11/27  D2 thins, advance diet as tolerated  Intake fair 8. Cardiomyopathy/chronic combined diastolic congestive heart failure. Monitor for any signs of fluid overload. 9. Hypertension. Coreg 12.5 mg twice a day. Monitor with increased mobility Vitals:   03/07/18 2008 03/08/18 0611  BP: 116/67 122/73  Pulse: 70 71  Resp: 14 17  Temp: 98.7 F (37.1 C) 98.8 F (37.1 C)  SpO2: 99% 97%   Controlled on 12/11 10. COPD/tobacco abuse. Continue inhalers. Provide counseling 11. Hyperlipidemia. Lipitor 12. Acute on chronic anemia.   Hemoglobin 8.6 on 12/9  Continue to monitor 13.  Hx gout left great toe does not appear acutely inflammed - tylenol prn, may need foot orthosis if pt has pain with WB 14.  CKD ?Stage II  Creatinine 1.17 on 12/9  Encouraging fluids  Continue to monitor 15.   Hypoalbuminemia  Supplement initiated on 11/25 16.  Hypokalemia  Potassium 3.7 on 12/9  KCL 32meq qd 12/6  Follow up Friday LOS: 20 days A FACE TO FACE EVALUATION WAS PERFORMED  Meredith Staggers 03/08/2018, 8:16 AM

## 2018-03-08 NOTE — Consult Note (Signed)
Neuropsychological Consultation   Patient:   Brandy [name]   DOB:   [birth year]  MR Number:  [id number]  Location:  [facility] [address] [place] [id number] [place] [address] Dept: [place]: [phone]           Date of Service:   03/07/2018  Start Time:   4 PM End Time:   5 PM  Provider/Observer:  Ilean Skill, Psy.D.       Clinical Neuropsychologist       Billing Code/Service: [phone] 4 Units  Chief Complaint:    Brandy [name] is a 77 year old right-handed female with a history of cardiomyopathy, chronic combined systolic and diastolic congestive heart failure, hypertension, COPD/tobacco abuse, CVA 06/2017.  The patient presented to [facility] on 02/10/2018 with left-sided weakness and right gaze preference.  Cranial CT scan showed mixed pattern of both acute suspected subacute right hemisphere ischemia in the setting of hyperdense right M1 MCA and acute left-sided weakness consistent with emergent large vessel occlusion on the right proximal MCA.  CT angiogram did confirm right MCA occlusion.  The patient was transferred to [facility] for mechanical thrombectomy.  The patient later had MRI follow-up showing a large area of infarction right MCA territory as well as hemorrhage within the right basal ganglia.  The patient has good continued left side hemiparesis.  03/07/2018:  The patient was more alert today and showed improved communication and improved mental status.   The patient was able to ask appropriate and targeted questions, particularly about options for SNF.    Reason for Service:  The patient was referred for neuropsychological consultation due to adjustment coping issues.  Below is the HPI for the current admission.  Brandy [name] is a 77 year old right-handed female with history of cardiomyopathy, chronic combined systolic and diastolic congestive heart  failure, hypertension,COPD/ tobacco abuse, CVA 06/2017 to [name] maintained on aspirin 162 mg daily. Per chart review patient lives with spouse. 2 level home with one step to entry. Reportedly independent prior to admission she still drives and prepares meals. She does have a housekeeper. [name] with left-sided weakness and right gaze preference. Cranial CT scan showed mixed pattern of both acute suspected subacute right hemisphere ischemia in the setting of hyperdense right M1 MCA and acute left side weakness consistent with emergent large vessel occlusion on the right proximal MCA. CT angiogram head and neck acute right M1 MCA occlusion. Patient was transferred to [facility] underwent mechanical thrombectomy minimal recannulization of occluded right MCA M1 segment per interventional radiology.Follow-upMRI the brain showed large area infarction right MCA territory as well as petechial hemorrhage within the right basal ganglia. Echocardiogram with ejection fraction of 40% diffuse hypokinesis. TEE completed 02/13/2018 with ejection fraction of 40% could not rule out early forming thrombus in the L AA versus artifact from posterior wall of the appendage. Multiple runs of nonsustained A. Tach and PACs were noted during procedure. Recommendations were for 30 day cardiac event monitor on discharge. Patient is maintained on aspirin and Plavix for CVA prophylaxis. Subcutaneous Lovenox for DVT prophylaxis. Dysphagia #1 thin liquid diet.Acute on chronic anemia 8.5 as well as platelets 120,000 and closely monitored. Therapy evaluations completed with recommendations of physical medicine rehabilitation consult. Patient was admitted for a comprehensive rehabilitation program.  Current Status:  The patient reports that depressive symptoms have improved but is frustrated that she will be going to skilled  nursing before going home.  She describes understanding and is not  resisting this as she knows she has more needs than her husband can do on his own.  She is motivated to continue to keep working on therapeutic efforts that are needed for her improvement.    she is having some depressive symptoms with loss of function.  The patient's husband as well as brother were also present.  The brother expressed some concerns about the patient's mood as well.  The patient expressed some dissatisfaction with some of the things that she is being asked to consume to keep adequate nutrition going.  However, she does report that she is continued to be motivated for therapies and is understanding why she is going through physical and occupational therapeutic interventions.  The patient reports that while depressive symptoms are present she is not being overwhelmed by the symptoms and they are not keeping her from fully participating in the therapeutic interventions.   Behavioral Observation: Brandy [name]  presents as a 77 y.o.-year-old Right African American Female who appeared her stated age. her dress was Appropriate and she was Well Groomed and her manners were Appropriate to the situation.  her participation was indicative of Appropriate and Redirectable behaviors.  There were any physical disabilities noted.  she displayed an appropriate level of cooperation and motivation.     Interactions:    Active Appropriate and Redirectable  Attention:   abnormal and attention span appeared shorter than expected for age  Memory:   within normal limits; recent and remote memory intact  Visuo-spatial:  not examined  Speech (Volume):  low  Speech:   normal; slowed speech patterns  Thought Process:  Coherent and Relevant  Though Content:  WNL; not suicidal and not homicidal  Orientation:   person, place, time/date and situation  Judgment:   Fair  Planning:   Fair  Affect:    Appropriate  Mood:    Dysphoric  Insight:   Fair  Intelligence:   normal    Medical  History:   Past Medical History:  Diagnosis Date  . Cardiomyopathy    a. EF of 25% in 07/2006 b. EF normalized by repeat echo in 2011 c. EF 35-40% by echo in 05/2017 with cath showing mild nonobstructive CAD  . Chronic combined systolic and diastolic CHF (congestive heart failure) ([place])   . Colon polyps   . Diverticulosis of colon   . Hypertension   . Hypertensive heart disease   . Osteopenia   . TIA (transient ischemic attack)   . Tobacco abuse    50 pack year   Psychiatric History:  The patient denies any prior history of depression or anxiety or other psychiatric symptoms.  Family Med/Psych History:  Family History  Problem Relation Age of Onset  . Hypertension Brother   . Hypertension Maternal Grandmother   . Cancer Paternal Grandmother     Risk of Suicide/Violence: virtually non-existent   Impression/DX:  Brandy [name] is a 77 year old right-handed female with a history of cardiomyopathy, chronic combined systolic and diastolic congestive heart failure, hypertension, COPD/tobacco abuse, CVA 06/2017.  The patient presented to [name] on 02/10/2018 with left-sided weakness and right gaze preference.  Cranial CT scan showed mixed pattern of both acute suspected subacute right hemisphere ischemia in the setting of hyperdense right M1 MCA and acute left-sided weakness consistent with emergent large vessel occlusion on the right proximal MCA.  CT angiogram did confirm right MCA occlusion.  The patient was  transferred to [facility] for mechanical thrombectomy.  The patient later had MRI follow-up showing a large area of infarction right MCA territory as well as hemorrhage within the right basal ganglia.  The patient has good continued left side hemiparesis.  The patient reports that depressive symptoms have improved but is frustrated that she will be going to skilled nursing before going home.  She describes understanding and is not resisting this as she knows she  has more needs than her husband can do on his own.  She is motivated to continue to keep working on therapeutic efforts that are needed for her improvement.   The patient was more alert today and showed improved communication and improved mental status.   The patient was able to ask appropriate and targeted questions, particularly about options for SNF.     Diagnosis:    Acute embolic stroke ([place]) - Plan: atorvastatin (LIPITOR) tablet 10 mg         Electronically Signed   _______________________ Ilean Skill, Psy.D.

## 2018-03-08 NOTE — Progress Notes (Signed)
Physical Therapy Session Note  Patient Details  Name: Brandy [name] MRN: [id number] Date of Birth: 05/15/1940  Today's Date: 03/08/2018 PT Individual Time: 1100-1132 PT Individual Time Calculation (min): 32 min   Short Term Goals: Week 3:  PT Short Term Goal 1 (Week 3): PT will transfer to and from [facility] with max assist consistently   PT Short Term Goal 2 (Week 3): Pt will maintian sitting balance with min assist x 3 min  PT Short Term Goal 3 (Week 3): Pt will propell WC 136f with min assist using hemi technique  PT Short Term Goal 4 (Week 3): Pt will ambulate at rail in hall x 5 with with max + 2 assist  Week 4:     Skilled Therapeutic Interventions/Progress Updates:   Pt received sitting in TIS WC and agreeable to PT. SB transfer to Hemi height WC with max assist from PT for safety. WC mobility x 1073fwith min assist from PT and moderate cues for attention to task and sequencing of UE and LE to maintain straight path and avoid obstacles on the L. Pillow used to position trunk to prevent excessive L lateral lean. Sit<>stand at rail in hall with total assist x 1. Standing tolerance with max-total assist x 20 sec, visual feedback from mirror and blocking of the L knee to prevent buckling. Patient returned to room and left sitting in TIS [facility] reach and all needs met.         Therapy Documentation Precautions:  Precautions Precautions: Fall Precaution Comments: mild Lt inattention, left hemiplegia, pusher to the left, motor impersistence Restrictions Weight Bearing Restrictions: No    Vital Signs: Therapy Vitals Temp: 98.5 F (36.9 C) Pulse Rate: (!) 59 Resp: 17 BP: 120/64 Patient Position (if appropriate): Sitting Oxygen Therapy SpO2: 98 % O2 Device: Room Air Pain:   0/10     Therapy/Group: Individual Therapy  AuLorie Phenix2/01/2018, 2:42 PM

## 2018-03-08 NOTE — Progress Notes (Signed)
Occupational Therapy Session Note  Patient Details  Name: Brandy [name] MRN: [id number] Date of Birth: [birth year]  Today's Date: 03/08/2018 OT Individual Time: 1005-1103 OT Individual Time Calculation (min): 58 min    Short Term Goals: Week 3:  OT Short Term Goal 1 (Week 3): Pt will complete sliding board transfer from bed to wheelchair with max assist. OT Short Term Goal 2 (Week 3): Pt will maintain dynamic sitting balance during UB bathing with mod assist.  OT Short Term Goal 3 (Week 3): Pt will perform toilet transfer with total assist +2 (pt 25%) using the Stedy.  OT Short Term Goal 4 (Week 3): Pt will complete sit to stand with total assist +2 (pt 35%) during LB selfcare tasks.   Skilled Therapeutic Interventions/Progress Updates:    Pt completed bathing and dressing supine to sit EOB.  Max assist for rolling in bed in order to wash peri area and buttocks.  She needs max assist for supine to sit EOB with max assist for dynamic sitting balance during UB and LB bathing.  Encouraged functional reaching laterally to the right to the pushing side.  Pt still resistance to lifting the LUE for functional reaching with delayed initiation to verbal cueing to start task.  Slight chest tone noted in the LUE during bathing task but still needs max assist for positioning and hand over hand use.  Max assist for UB dressing with total assist +2 (pt 20%) for LB dressing sit to stand.  Total assist for sliding board transfer to the wheelchair after therapist donned her TEDs and shoes.  Pt left in the wheelchair with call button and chair alarm in place.    Therapy Documentation Precautions:  Precautions Precautions: Fall Precaution Comments: mild Lt inattention, left hemiplegia, pusher to the left, motor impersistence Restrictions Weight Bearing Restrictions: No  Pain: Pain Assessment Pain Scale: Faces Faces Pain Scale: Hurts little more Pain Location: Leg Pain Orientation: Right;Left Pain  Onset: With Activity Pain Intervention(s): Repositioned;RN made aware ADL: See Care Tool section for some details of ADL  Therapy/Group: Individual Therapy  Anirudh Baiz OTR/L 03/08/2018, 11:48 AM

## 2018-03-09 ENCOUNTER — Inpatient Hospital Stay (HOSPITAL_COMMUNITY): Payer: Medicare Other | Admitting: Physical Therapy

## 2018-03-09 ENCOUNTER — Inpatient Hospital Stay (HOSPITAL_COMMUNITY): Payer: Medicare Other | Admitting: Speech Pathology

## 2018-03-09 ENCOUNTER — Inpatient Hospital Stay (HOSPITAL_COMMUNITY): Payer: Medicare Other | Admitting: Occupational Therapy

## 2018-03-09 LAB — CREATININE, SERUM
CREATININE: 1.25 mg/dL — AB (ref 0.44–1.00)
GFR, EST AFRICAN AMERICAN: 49 mL/min — AB (ref >60)
GFR, EST NON AFRICAN AMERICAN: 42 mL/min — AB (ref >60)

## 2018-03-09 NOTE — Progress Notes (Signed)
Came to room for Anoro Inhaler.  Pt is currently out of room for therapy.  Spoke w/ RN= aware.

## 2018-03-09 NOTE — Patient Care Conference (Signed)
Inpatient RehabilitationTeam Conference and Plan of Care Update Date: 03/08/2018   Time: 11:05 AM    Patient Name: Brandy [name]      Medical Record Number: [id number]  Date of Birth: 14-Sep-1940 Sex: Female         Room/Bed: 4M02C/4M02C-01 Payor Info: Payor: [employer] MEDICARE / Plan: UHC MEDICARE / Product Type: *No Product type* /    Admitting Diagnosis: stroke  Admit Date/Time:  02/16/2018  5:18 PM Admission Comments: No comment available   Primary Diagnosis:  <principal problem not specified> Principal Problem: <principal problem not specified>  Patient Active Problem List   Diagnosis Date Noted  . Left hemiplegia ([place])   . Chronic kidney disease   . Hypokalemia   . Acute on chronic anemia   . Hypoalbuminemia due to protein-calorie malnutrition ([place])   . Hemiparesis affecting left side as late effect of stroke ([place]) 02/16/2018  . Left-sided neglect 02/16/2018  . Right middle cerebral artery stroke ([place]) 02/16/2018  . Acute embolic stroke ([place])   . Acute right MCA stroke ([place])   . History of CVA with residual deficit   . Benign essential HTN   . Chronic combined systolic and diastolic congestive heart failure ([place])   . History of fusion of cervical spine   . Stage 3 chronic kidney disease ([place])   . Acute blood loss anemia   . Stroke ([place]) 02/10/2018  . Middle cerebral artery embolism, right 02/10/2018  . Health care maintenance 11/01/2017  . Non-ischemic cardiomyopathy ([place]) 09/14/2017  . Precordial chest pain 09/14/2017  . B12 deficiency 09/13/2017  . Tachycardia 09/13/2017  . Acute ischemic stroke ([place])   . Seasonal allergies 07/13/2017  . TIA (transient ischemic attack) 07/12/2017  . Anemia 07/12/2017  . Pneumonia 06/02/2017  . Pleural effusion 06/02/2017  . Acute on chronic systolic congestive heart failure ([place])   . Elevated troponin   . Other fatigue 05/04/2017  . Dyspnea 05/04/2017  . Pseudophakia of both eyes 08/06/2016  . Chronic combined  systolic and diastolic CHF (congestive heart failure) ([place]) 07/09/2016  . Hypertensive heart disease   . Hypertension   . Nuclear sclerosis, bilateral 05/24/2016  . HTN (hypertension) 01/22/2013  . TOBACCO ABUSE 02/18/2008  . CARDIOMYOPATHY 02/18/2008  . Diverticulosis of colon 02/18/2008  . OSTEOPENIA 02/18/2008  . Disorder of bone and cartilage 02/18/2008    Expected Discharge Date: Expected Discharge Date: (SNF)  Team Members Present: Physician leading conference: Dr. Alysia Penna Social Worker Present: Alfonse Alpers, LCSW Nurse Present: Other (comment)(Marie Ena Dawley, RN) PT Present: Barrie Folk, PT;Rosita Dechalus, PTA OT Present: Clyda Greener, OT SLP Present: Windell Moulding, SLP PPS Coordinator present : Gunnar Fusi     Current Status/Progress Goal Weekly Team Focus  Medical   slow functional progress from right mca stroke. bp's better. nutrition improving. following renal function and electrolytes  improve activity tolerance and engagement with therapy  see above   Bowel/Bladder   Incontinent of bladder/Bowel , incontinent care continue, LBM 03/05/2018,continue laxative and stool softnerer daily and as needed  Restore Continence   qs and prn assessment of toileting needs   Swallow/Nutrition/ Hydration   Dys 2 and thin via regular cup, Min A, husband signed off for supervision  supervision   dys 3 trials   ADL's   Max assist for UB selfcare sitting unsupported.  Max to total for LB selfcare supine to sit.  Total +2 (pt 20%) for attempted sit to stand and standing for LB selfcare as well.  Max  hand over hand for LUE functional use, with slight increased tone noted in the chest.  Left visual field deficit and decreased sustained attention still present as well  down graded max to total assist  neuromuscular re-education, selfcare retraining, transfer training, balance retraining, visual compensation, pt/family education   Mobility   maxA 1-2 SB transfers pending fatigue  level, pt able to tolerate unsupported sitting and maintaining midline for approx 5 min without cues, min to supervision w/c propulsion 110ft   mod assist transfres. max assist gait for short distances. min assist WC mobility with hemi technique.   sitting balance, w/c mobiltiy, decreasing pushing tendencies, d/c planning    Communication   Min A, phrase  supervision  speech intelligibility strategies and education   Safety/Cognition/ Behavioral Observations  Mod-Min A  min assist   Problem solving, attention, memory, awareness, left scan   Pain   Occassional discomfort to LE''s amd geenralized disocmfort ,cont prn Tylenol monitor effectiveness of meds , notify medical team if no results  ,< 2  Assess QS and prn   Skin   No new skin isssues reported  no report skin tears or injuries while o rehab  QS assessment and monitoring of skin care issues or concerns, notify Medical team    Rehab Goals Patient on target to meet rehab goals: Yes Rehab Goals Revised: goals were downgraded to maximum assistance *See Care Plan and progress notes for long and short-term goals.     Barriers to Discharge  Current Status/Progress Possible Resolutions Date Resolved   Physician    Medical stability     see above  see medical progress notes      Nursing                  PT  Inaccessible home environment;Decreased caregiver support;Home environment access/layout                 OT                  SLP                SW                Discharge Planning/Teaching Needs:  Pt and husband are wanting to pursue SNF.  Husband has chosen facilities that accept pt's insurance for CSW to pursue.  Awaiting bed offers now.  Defer family education and DME to next venue.   Team Discussion:  Pt with right CVA and left neglect who is off IVF now and blood pressure is controlled.  Husband present daily.  Pt is incontinent of bladder and bowel and is total care for nursing.  She has poor p.o. intake and staff is  encouraging pt to eat and drink.  Pt is making slow progress with OT, but sitting balance is better, though she still pushes to the left.  Slide board txs are total A, and sit to stands are +2.  Pt can turn to compensate for left neglect.  Pt has a little tone in her chest, but nothing that is functional in elbow or shoulder.  Pt has little sensation.  Mod to max A OT goals are being downgraded to max to total A.  Pt is mod A with bed mobility and supervision to min A for static sitting balance.  Can propel the hemi w/c with min A to supervision with lots of cues.  Pt is a little more attentive with ST.  She is on  D2 diet/thin liquids, no straw with D3 trials with ST.  She feeds herself and husband has been checked off to supervise meals.  Revisions to Treatment Plan:  [name] goals downgraded.    Continued Need for Acute Rehabilitation Level of Care: The patient requires daily medical management by a physician with specialized training in physical medicine and rehabilitation for the following conditions: Daily direction of a multidisciplinary physical rehabilitation program to ensure safe treatment while eliciting the highest outcome that is of practical value to the patient.: Yes Daily medical management of patient stability for increased activity during participation in an intensive rehabilitation regime.: Yes Daily analysis of laboratory values and/or radiology reports with any subsequent need for medication adjustment of medical intervention for : Neurological problems;Blood pressure problems;Renal problems   I attest that I was present, lead the team conference, and concur with the assessment and plan of the team.    [place], Silvestre Mesi 03/09/2018, 11:04 AM

## 2018-03-09 NOTE — Progress Notes (Signed)
Social Work Patient ID: Brandy [name], female   DOB: Apr 29, 1940, 77 y.o.   MRN: [id number]   CSW met with pt and her husband 03-08-18 to update them on team conference discussion.  Pt and husband are well informed of pt's slow progress and the need for continued rehab outside of the hospital setting.  CSW discussed with pt choosing a SNF that is in pt's [facility] Medicare network and he expressed understanding.  CSW has completed FL2 and faxed to area SNFs and await bed offers.  Pt asking about DME and CSW explained that it would be decided at the SNF what DME she needs at the time of d/c from that venue, but praised her for processing her future needs.  Husband asked for a letter for [place] to show the need for pt to have power restored as quickly as possible in the event of an outage.  CSW to work on this for pt/husband.  CSW also awaits dtr's FMLA paperwork to have MD complete this in preparation of her helping her parents, as needed.  CSW will continue to follow and assist as needed.

## 2018-03-09 NOTE — Progress Notes (Signed)
Physical Therapy Session Note  Patient Details  Name: Brandy [name] MRN: [id number] Date of Birth: 10-17-1940  Today's Date: 03/09/2018 PT Individual Time: 0800-0900 and 1515-1545 PT Individual Time Calculation (min): 60 min and30  Short Term Goals: Week 3:  PT Short Term Goal 1 (Week 3): PT will transfer to and from [facility] with max assist consistently   PT Short Term Goal 2 (Week 3): Pt will maintian sitting balance with min assist x 3 min  PT Short Term Goal 3 (Week 3): Pt will propell WC 140f with min assist using hemi technique  PT Short Term Goal 4 (Week 3): Pt will ambulate at rail in hall x 5 with with max + 2 assist  Week 4:     Skilled Therapeutic Interventions/Progress Updates:  Pt received supine in bed and agreeable to PT. Rolling R and L x 4 bil with max assist to the R and min assist to the L. Multimodal facilitation and cuing for improved Neuromotor control of trunk to activate R pectoralis and parascapular muscles and initiate rolling. Supine>sit transfer with max assist and max cues for sequencing and proper use of RUE to push into sitting position without rails.   SB transfer to Hemi height WC with max assist from PT to the R. Max multimodal cues for set up, trunk control, and improved use of RUE and LE to initiate scoot to bed.   WC mobility instructed by PT with mod assist on this day. Max cues for attention to task and Improved coordination of the RLE and RUE to prevent veer to the L.   [facility] to reach to the R. SB transfer to mat table and then to [facility] 2 for safety secondary to fatigue. Patient returned to room and [facility] [place] in reach and all needs met.     Session 2 Pt received sitting in WC and agreeable to PT. [place] transfer to mat table with 8inch step under BLE and max assist and increased time. Pt required max multimodal cues for initiation of movement through  trunk to allow lateral scoot across board. Sitting balance EOB with BLE supported on step and visual feedback through mirror. Lateral weight shifts R and with return to midline. Moderate assist from PT to correct LOB to the L and improved head and trunk posture. SB [facility] to [facility] the L with max assist to maintian upright posture         Therapy Documentation Precautions:  Precautions Precautions: Fall Precaution Comments: mild Lt inattention, left hemiplegia, pusher to the left, motor impersistence Restrictions Weight Bearing Restrictions: No    Vital Signs: Therapy Vitals Temp: 98.2 F (36.8 C) Pulse Rate: 96 Resp: 17 BP: 122/61 Patient Position (if appropriate): Lying Oxygen Therapy SpO2: 96 % O2 Device: Room Air Pain: Pain Assessment Pain Scale: 0-10 Pain Score: 2     Therapy/Group: Individual Therapy  ALorie Phenix12/02/2018, 9:18 AM

## 2018-03-09 NOTE — Progress Notes (Signed)
[place] PHYSICAL MEDICINE & REHABILITATION PROGRESS NOTE   Subjective/Complaints: Feet burning a bit this morning. Otherwise doing ok. Receiving meds this morning  ROS: Limited due to cognitive/behavioral   Objective:   No results found. No results for input(s): WBC, HGB, HCT, PLT in the last 72 hours. Recent Labs    03/09/18 0505  CREATININE 1.25*    Intake/Output Summary (Last 24 hours) at 03/09/2018 1107 Last data filed at 03/08/2018 1900 Gross per 24 hour  Intake 440 ml  Output -  Net 440 ml     Physical Exam: Vital Signs Blood pressure 122/61, pulse 96, temperature 98.2 F (36.8 C), resp. rate 17, height 5\' 4"  (1.626 m), weight 85.7 kg, SpO2 96 %.  Constitutional: No distress . Vital signs reviewed. HEENT: EOMI, oral membranes moist Neck: supple Cardiovascular: RRR without murmur. No JVD    Respiratory: CTA Bilaterally without wheezes or rales. Normal effort    GI: BS +, non-tender, non-distended  Musc: Edema left hand and foot. Neurological: Alert. Dysarthria. Phonation comes and goes Follows commands. Left inattention Sensation absent to light touch left upper and lower extremity. Sensitive with movement though Motor: LUE/LLE: 0/5 proximal to distal,stble exam Right lower extremity: 2/5 hip flexion, knee extension, 4+/5 ankle dorsiflexion,stable No increase in tone Skin: Skin iswarmand dry. Psych flat  Assessment/Plan: 1. Functional deficits secondary to right MCA infarction with right M1 occlusion status post minimal recannulization after mechanical thrombectomy as well as history of left MCA infarction April 2019. which require 3+ hours per day of interdisciplinary therapy in a comprehensive inpatient rehab setting.  Physiatrist is providing close team supervision and 24 hour management of active medical problems listed below.  Physiatrist and rehab team continue to assess barriers to discharge/monitor patient progress toward functional and  medical goals  Care Tool:  Bathing    Body parts bathed by patient: Left arm, Chest, Abdomen, Face, Front perineal area   Body parts bathed by helper: Right arm, Face     Bathing assist Assist Level: Maximal Assistance - Patient 24 - 49%     Upper Body Dressing/Undressing Upper body dressing   What is the patient wearing?: Pull over shirt    Upper body assist Assist Level: Maximal Assistance - Patient 25 - 49%    Lower Body Dressing/Undressing Lower body dressing      What is the patient wearing?: Pants     Lower body assist Assist for lower body dressing: 2 Helpers     Toileting Toileting    Toileting assist Assist for toileting: Total Assistance - Patient < 25%     Transfers Chair/bed transfer  Transfers assist     Chair/bed transfer assist level: Total Assistance - Patient < 25%     Locomotion Ambulation   Ambulation assist   Ambulation activity did not occur: Safety/medical concerns  Assist level: Total Assistance - Patient < 25% Assistive device: Lite Gait Max distance: 35   Walk 10 feet activity   Assist  Walk 10 feet activity did not occur: Safety/medical concerns  Assist level: Total Assistance - Patient < 25% Assistive device: Lite Gait   Walk 50 feet activity   Assist Walk 50 feet with 2 turns activity did not occur: Safety/medical concerns         Walk 150 feet activity   Assist Walk 150 feet activity did not occur: Safety/medical concerns         Walk 10 feet on uneven surface  activity   Assist Walk 10  feet on uneven surfaces activity did not occur: Safety/medical concerns         Wheelchair     Assist   Type of Wheelchair: Manual    Wheelchair assist level: Minimal Assistance - Patient > 75% Max wheelchair distance: 120    Wheelchair 50 feet with 2 turns activity    Assist        Assist Level: Minimal Assistance - Patient > 75%   Wheelchair 150 feet activity     Assist Wheelchair  150 feet activity did not occur: Safety/medical concerns        Medical Problem List and Plan: 1.Left side weaknesssecondary to right MCA infarction with right M1 occlusion status post minimal recannulization after mechanical thrombectomy as well as history of left MCA infarction April 2019. Advise 30 day cardiac monitor on discharge  -Continue CIR therapies including PT, OT, and SLP   PRAFO/WHO nightly 2. DVT Prophylaxis/Anticoagulation: Subcutaneous Lovenox. 3. Pain Management:Tylenol as needed  -?gabapentin trial 4. Mood:Aricept 5 mg daily 5. Neuropsych: This patienthe iscapable of making decisions on herown behalf. 6. Skin/Wound Care:Routine skin checks 7. Fluids/Electrolytes/Nutrition:Routine ins and outs  IVF [place] on 11/27  D2 thins, advance diet as tolerated  Intake fair 8. Cardiomyopathy/chronic combined diastolic congestive heart failure. Monitor for any signs of fluid overload. 9. Hypertension. Coreg 12.5 mg twice a day. Monitor with increased mobility Vitals:   03/08/18 1939 03/09/18 0523  BP: 114/66 122/61  Pulse: 63 96  Resp: 18 17  Temp: 98.5 F (36.9 C) 98.2 F (36.8 C)  SpO2: 99% 96%   Controlled on 12/12 10. COPD/tobacco abuse. Continue inhalers. Provide counseling 11. Hyperlipidemia. Lipitor 12. Acute on chronic anemia.   Hemoglobin 8.6 on 12/9  Continue to monitor 13.  Hx gout left great toe does not appear acutely inflammed - tylenol prn, may need foot orthosis if pt has pain with WB 14.  CKD ?Stage II  Creatinine 1.17 on 12/9  Encouraging fluids  Continue to monitor 15.  Hypoalbuminemia  Supplement initiated on 11/25 16.  Hypokalemia  Potassium 3.7 on 12/9  KCL 56meq qd 12/6  Follow up tomorrow LOS: 21 days A FACE TO [place] 03/09/2018, 11:07 AM

## 2018-03-09 NOTE — NC FL2 (Signed)
[place] MEDICAID FL2 LEVEL OF CARE SCREENING TOOL     IDENTIFICATION  Patient Name: Brandy [name] Birthdate: 02-09-1941 Sex: female Admission Date (Current Location): 02/16/2018  [facility] and [place] Number:  Herbalist and Address:  [facility], [place] [address] [place], [place] [address]      Provider Number: [id number]  Attending Physician Name and Address:  Jamse Arn, MD  Relative Name and Phone Number:       Current Level of Care: Other (Comment)(Inpatient Rehabilitation) Recommended Level of Care: [place] Prior Approval Number:    Date Approved/Denied:   PASRR Number: [id number]  Discharge Plan: SNF    Current Diagnoses: Patient Active Problem List   Diagnosis Date Noted  . Left hemiplegia ([place])   . Chronic kidney disease   . Hypokalemia   . Acute on chronic anemia   . Hypoalbuminemia due to protein-calorie malnutrition ([place])   . Hemiparesis affecting left side as late effect of stroke ([place]) 02/16/2018  . Left-sided neglect 02/16/2018  . Right middle cerebral artery stroke ([place]) 02/16/2018  . Acute embolic stroke ([place])   . Acute right MCA stroke ([place])   . History of CVA with residual deficit   . Benign essential HTN   . Chronic combined systolic and diastolic congestive heart failure ([place])   . History of fusion of cervical spine   . Stage 3 chronic kidney disease ([place])   . Acute blood loss anemia   . Stroke ([place]) 02/10/2018  . Middle cerebral artery embolism, right 02/10/2018  . Health care maintenance 11/01/2017  . Non-ischemic cardiomyopathy ([place]) 09/14/2017  . Precordial chest pain 09/14/2017  . B12 deficiency 09/13/2017  . Tachycardia 09/13/2017  . Acute ischemic stroke ([place])   . Seasonal allergies 07/13/2017  . TIA (transient ischemic attack) 07/12/2017  . Anemia 07/12/2017  . Pneumonia 06/02/2017  . Pleural effusion 06/02/2017  . Acute on chronic systolic congestive heart failure  ([place])   . Elevated troponin   . Other fatigue 05/04/2017  . Dyspnea 05/04/2017  . Pseudophakia of both eyes 08/06/2016  . Chronic combined systolic and diastolic CHF (congestive heart failure) ([place]) 07/09/2016  . Hypertensive heart disease   . Hypertension   . Nuclear sclerosis, bilateral 05/24/2016  . HTN (hypertension) 01/22/2013  . TOBACCO ABUSE 02/18/2008  . CARDIOMYOPATHY 02/18/2008  . Diverticulosis of colon 02/18/2008  . OSTEOPENIA 02/18/2008  . Disorder of bone and cartilage 02/18/2008    Orientation RESPIRATION BLADDER Height & Weight     Self, Situation, Place, Time  Normal Incontinent Weight: 85.7 kg Height:  5\' 4"  (162.6 cm)  BEHAVIORAL SYMPTOMS/MOOD NEUROLOGICAL BOWEL NUTRITION STATUS      Incontinent Diet(Dysphagia 2 with thin liquids via cup - no straws; trials of Dysphagia 3 diet)  AMBULATORY STATUS COMMUNICATION OF NEEDS Skin   Limited Assist(wheelchair propulsion is supervision to min assist; no ambulation at this time - standing at rail or in standing frame) Verbally Normal                       Personal Care Assistance Level of Assistance  Bathing, Dressing(slide board transfers are total A, can be max A) Bathing Assistance: Maximum assistance   Dressing Assistance: Maximum assistance     Functional Limitations Info  Sight, Speech Sight Info: Impaired(left field cut; left neglect)   Speech Info: Impaired(communicates at phrase level and is usually understood)    [place]  Blood  pressure, PT (By licensed PT), OT (By licensed OT), Bowel and bladder program, Restorative feeding program, Speech therapy Blood Pressure Frequency: daily    PT Frequency: 5x/week OT Frequency: 5x/week Bowel and Bladder Program Frequency: timed toileting; daily laxatives and stool softeners Restorative Feeding Program Frequency: Dysphagia 2 diet with thin liquids via cup - no straws; trials of Dysphagia 3 foods Speech Therapy Frequency: 3-5X/week       Contractures Contractures Info: Not present    Additional Factors Info  Code Status, Allergies Code Status Info: full code Allergies Info: Lisinopril           Current Medications (03/09/2018):  This is the current hospital active medication list Current Facility-Administered Medications  Medication Dose Route Frequency Provider Last Rate Last Dose  . acetaminophen (TYLENOL) tablet 650 mg  650 mg Oral Q4H PRN Cathlyn Parsons, PA-C   650 mg at 03/09/18 8850   Or  . acetaminophen (TYLENOL) suppository 650 mg  650 mg Rectal Q4H PRN Angiulli, Lavon Paganini, PA-C      . aspirin tablet 325 mg  325 mg Oral Daily Cathlyn Parsons, PA-C   325 mg at 03/09/18 0802  . atorvastatin (LIPITOR) tablet 10 mg  10 mg Oral q1800 AngiulliLavon Paganini, PA-C   10 mg at 03/08/18 1739  . carvedilol (COREG) tablet 12.5 mg  12.5 mg Oral BID WC AngiulliLavon Paganini, PA-C   12.5 mg at 03/09/18 0802  . clopidogrel (PLAVIX) tablet 75 mg  75 mg Oral Daily Cathlyn Parsons, PA-C   75 mg at 03/09/18 0802  . donepezil (ARICEPT) tablet 5 mg  5 mg Oral QHS AngiulliLavon Paganini, PA-C   5 mg at 03/08/18 2348  . enoxaparin (LOVENOX) injection 40 mg  40 mg Subcutaneous Q24H Cathlyn Parsons, PA-C   40 mg at 03/08/18 2774  . feeding supplement (PRO-STAT SUGAR FREE 64) liquid 30 mL  30 mL Oral BID Jamse Arn, MD   30 mL at 03/09/18 0802  . MEDLINE mouth rinse  15 mL Mouth Rinse BID Jamse Arn, MD   15 mL at 03/09/18 1287  . megestrol (MEGACE) 400 MG/10ML suspension 200 mg  200 mg Oral BID Charlett Blake, MD   200 mg at 03/09/18 0802  . methylphenidate (RITALIN) tablet 5 mg  5 mg Oral BID WC Kirsteins, Luanna Salk, MD   5 mg at 03/09/18 0802  . potassium chloride SA (K-DUR,KLOR-CON) CR tablet 20 mEq  20 mEq Oral Daily Jamse Arn, MD   20 mEq at 03/09/18 0802  . senna-docusate (Senokot-S) tablet 1 tablet  1 tablet Oral QHS PRN Cathlyn Parsons, PA-C   1 tablet at 02/20/18 8676  . sorbitol 70 % solution  30 mL  30 mL Oral Daily PRN Cathlyn Parsons, PA-C   30 mL at 03/02/18 1207  . umeclidinium-vilanterol (ANORO ELLIPTA) 62.5-25 MCG/INH 1 puff  1 puff Inhalation Daily AngiulliLavon Paganini, PA-C   1 puff at 03/09/18 1034     Discharge Medications: Please see discharge summary for a list of discharge medications.  Relevant Imaging Results:  Relevant Lab Results:   Additional Information SSN:  [id number]  Miquan Tandon, Silvestre Mesi, LCSW

## 2018-03-09 NOTE — Progress Notes (Signed)
Occupational Therapy Session Note  Patient Details  Name: Brandy [name] MRN: [id number] Date of Birth: [birth year]  Today's Date: 03/09/2018 OT Individual Time: 1003-1100 OT Individual Time Calculation (min): 57 min    Short Term Goals: Week 3:  OT Short Term Goal 1 (Week 3): Pt will complete sliding board transfer from bed to wheelchair with max assist. OT Short Term Goal 2 (Week 3): Pt will maintain dynamic sitting balance during UB bathing with mod assist.  OT Short Term Goal 3 (Week 3): Pt will perform toilet transfer with total assist +2 (pt 25%) using the Stedy.  OT Short Term Goal 4 (Week 3): Pt will complete sit to stand with total assist +2 (pt 35%) during LB selfcare tasks.   Skilled Therapeutic Interventions/Progress Updates:    Ptcompleted bathing and dressing supine to sit EOB.  Began with transfer from the tilt in space wheelchair to the bed with total assist to the right using the sliding board.  Once on the bed worked on undressing her UB with max assist and completing UB bathing.  Pt with increased LOB to the left when attempting to wash.  Pt was unable to correct without max assist secondary to motor impairment.  Pt needed max hand over hand for integration of the LUE for applying deodorant or washing the right arm.  Increased tone noted in the pectoral on the left side however.  Pt transitioned to supine for LB bathing for safety.  She was able to wash her front peri area and upper legs with supervision but then needed therapist assist to wash the lower legs, buttocks, and feet.  Therapist provided total assist for donning all LB clothing.   At end of session transfer was completed from bed to wheelchair with max assist to the left using the sliding board.  Pt was left up in the wheelchair with call button in reach and alarm belt in place.  [name] spouse present and observed throughout session.    Therapy Documentation Precautions:  Precautions Precautions: Fall Precaution  Comments: mild Lt inattention, left hemiplegia, pusher to the left, motor impersistence Restrictions Weight Bearing Restrictions: No   Pain: Pain Assessment Pain Scale: Faces Pain Score: 0-No pain ADL: See Care Tool Section for some details of ADL  Therapy/Group: Individual Therapy  Kanija Remmel OTR/L 03/09/2018, 12:26 PM

## 2018-03-09 NOTE — Progress Notes (Signed)
Speech Language Pathology Daily Session Note  Patient Details  Name: Brandy [name] MRN: [id number] Date of Birth: 31-May-1940  Today's Date: 03/09/2018 SLP Individual Time: 1340-1435 SLP Individual Time Calculation (min): 55 min  Short Term Goals: Week 3: SLP Short Term Goal 1 (Week 3): Pt will consume trials of dys 3 textures and thin liquids with supervision cues for use of swallowing precautions and minimal overt s/s of aspiration.   SLP Short Term Goal 2 (Week 3): Pt will sustain her attention to basic, familiar tasks for 15 minutes with Min A cues for redirection.   SLP Short Term Goal 3 (Week 3): Pt will locate items to the left of midline during functional tasks in >50% of opportunities with supervision cues.   SLP Short Term Goal 4 (Week 3): Pt will complete basic, familiar tasks with Mod cues for functional problem solving.   SLP Short Term Goal 5 (Week 3): Pt will recall basic, daily information with supervision verbal cues.   SLP Short Term Goal 6 (Week 3): Pt will increase vocal intensity to achieve intelligibility at the sentence level with min assist to achieve > 75% intelligibility.    Skilled Therapeutic Interventions:  Pt was seen for skilled ST targeting cognitive and dysphagia goals.  Upon arrival, therapist noticed that pt had some celery left over from dinner last night.  Pt and husband both endorse that pt had wings and celery last night which they report "went well."  Pt consumed celery with some anterior loss of ranch dressing when mixed with saliva but otherwise functional mastication of advanced textures.  Oral cavity was clear of residue post swallow.  No overt s/s of aspiration evident with solids or liquids.  As a result, recommend advancing pt's diet to regular textures and thin liquids with ongoing need for full supervision due to cognition.  Pt will likely still need some foods cut up for her and she will likely need to avoid certain textures for now given her oral  motor deficits.  Discussed precautions with pt's husband who verbalized understanding.  SLP also facilitated the session with a basic grocery task targeting visual scanning and attention to task.  Initially, pt was able to locate targeted items from a grocery store flyer with min assist; however, as session progressed the environment where pt was working became much busier and noisier and pt needed up to max cues for redirection to task in the presence of increased distractions.  Pt also needed increased cuing to locate items to the left of midline as distractions increased, from min to max assist. Pt was returned to room and left in wheelchair with husband at bedside.  Continue per current plan of care.    Pain Pain Assessment Pain Scale: 0-10 Pain Score: 0-No pain  Therapy/Group: Individual Therapy  Teasha Murrillo, Selinda Orion 03/09/2018, 2:40 PM

## 2018-03-10 ENCOUNTER — Inpatient Hospital Stay (HOSPITAL_COMMUNITY): Payer: Medicare Other | Admitting: Speech Pathology

## 2018-03-10 ENCOUNTER — Inpatient Hospital Stay (HOSPITAL_COMMUNITY): Payer: Medicare Other | Admitting: Physical Therapy

## 2018-03-10 ENCOUNTER — Inpatient Hospital Stay (HOSPITAL_COMMUNITY): Payer: Medicare Other | Admitting: Occupational Therapy

## 2018-03-10 LAB — CBC
HCT: 29.7 % — ABNORMAL LOW (ref 36.0–46.0)
Hemoglobin: 8.8 g/dL — ABNORMAL LOW (ref 12.0–15.0)
MCH: 27.2 pg (ref 26.0–34.0)
MCHC: 29.6 g/dL — AB (ref 30.0–36.0)
MCV: 91.7 fL (ref 80.0–100.0)
PLATELETS: 242 10*3/uL (ref 150–400)
RBC: 3.24 MIL/uL — ABNORMAL LOW (ref 3.87–5.11)
RDW: 15.7 % — AB (ref 11.5–15.5)
WBC: 5.6 10*3/uL (ref 4.0–10.5)
nRBC: 0 % (ref 0.0–0.2)

## 2018-03-10 LAB — BASIC METABOLIC PANEL
Anion gap: 8 (ref 5–15)
BUN: 23 mg/dL (ref 8–23)
CALCIUM: 8.8 mg/dL — AB (ref 8.9–10.3)
CO2: 23 mmol/L (ref 22–32)
CREATININE: 1.26 mg/dL — AB (ref 0.44–1.00)
Chloride: 112 mmol/L — ABNORMAL HIGH (ref 98–111)
GFR calc Af Amer: 48 mL/min — ABNORMAL LOW (ref >60)
GFR calc non Af Amer: 42 mL/min — ABNORMAL LOW (ref >60)
GLUCOSE: 110 mg/dL — AB (ref 70–99)
Potassium: 4 mmol/L (ref 3.5–5.1)
Sodium: 143 mmol/L (ref 135–145)

## 2018-03-10 NOTE — Progress Notes (Addendum)
Speech Language Pathology Weekly Progress and Session Note  Patient Details  Name: Brandy [name] MRN: [id number] Date of Birth: 17-Sep-1940  Beginning of progress report period: 03/03/2018 End of progress report period: 03/10/2018  Today's Date: 03/10/2018 SLP Individual Time: 1105-1200; [id number] SLP Individual Time Calculation (min): 55 min, 11 min  Short Term Goals: Week 3: SLP Short Term Goal 1 (Week 3): Pt will consume trials of dys 3 textures and thin liquids with supervision cues for use of swallowing precautions and minimal overt s/s of aspiration.   SLP Short Term Goal 1 - Progress (Week 3): Met SLP Short Term Goal 2 (Week 3): Pt will sustain her attention to basic, familiar tasks for 15 minutes with Min A cues for redirection.   SLP Short Term Goal 2 - Progress (Week 3): Progressing toward goal SLP Short Term Goal 3 (Week 3): Pt will locate items to the left of midline during functional tasks in >50% of opportunities with supervision cues.   SLP Short Term Goal 3 - Progress (Week 3): Progressing toward goal SLP Short Term Goal 4 (Week 3): Pt will complete basic, familiar tasks with Mod cues for functional problem solving.   SLP Short Term Goal 4 - Progress (Week 3): Met SLP Short Term Goal 5 (Week 3): Pt will recall basic, daily information with supervision verbal cues.   SLP Short Term Goal 5 - Progress (Week 3): Progressing toward goal SLP Short Term Goal 6 (Week 3): Pt will increase vocal intensity to achieve intelligibility at the sentence level with min assist to achieve > 75% intelligibility.   SLP Short Term Goal 6 - Progress (Week 3): Met    New Short Term Goals: Week 4: SLP Short Term Goal 1 (Week 4): STG=LTG due to remaining length of stay, pending SNF placement   Weekly Progress Updates:   Pt has made limited gains this reporting period and has met 3 out of 6 short term goals.  Pt is currently mod assist for tasks due to moderate cognitive deficits s/p right brain  dysfunction.  Pt has demonstrated improved mastication of advanced solids and is consuming regular textures, thin liquids diet with min cues for use of swallowing precautions.  Pt and family education is ongoing.  Pt would continue to benefit from skilled ST while inpatient in order to maximize functional independence and reduce burden of care prior to discharge.  Anticipate that pt will need SNF placement at discharge in addition to ST follow at next level of care.    Intensity: Minumum of 1-2 x/day, 30 to 90 minutes Frequency: 3 to 5 out of 7 days Duration/Length of Stay: 21-28 days  Treatment/Interventions: Cognitive remediation/compensation;Cueing hierarchy;Dysphagia/aspiration precaution training;Internal/external aids;Environmental controls;Patient/family education;Functional tasks   Daily Session  Skilled Therapeutic Interventions:  Session 1:  Pt was seen for skilled ST targeting cognitive goals.  Pt was received upright in wheelchair, awake, alert, and agreeable to participating in therapy.  SLP facilitated the session with a novel card game targeting visual scannign and attention to task.  Pt needed mod cues to locate matches between cards between left and right visual fields.  Pt also needed mod-max cues for redirection to task due to distraction to the environment.  Pt was returned to the room and left in chair with chair alarm set and call Coker within reach.  Husband was at bedside.   Session 2:  Pt was seen for skilled ST targeting dysphagia goals.  Pt was eating lunch upon therapist's arrival.  Pt  demonstrated appropriate ability to clear boluses from the oral cavity with increased time and self initiated use of liquid wash.  Pt had intermittent anterior loss of boluses from the oral cavity which she self corrected with supervision.  No overt s/s of aspiration were evident with solids or liquids.  Continue to recommend regular textures, thin liquids.  Goals updated on this date to  reflect current progress and plan of care.  Pt left up in wheelchair with husband at bedside.      General    Pain Pain Assessment Pain Scale: Faces Pain Score: 0-No pain  Therapy/Group: Individual Therapy  Vi Biddinger, Selinda Orion 03/10/2018, 12:41 PM

## 2018-03-10 NOTE — Progress Notes (Signed)
[place] PHYSICAL MEDICINE & REHABILITATION PROGRESS NOTE   Subjective/Complaints: No new complaints. Legs hurt from time to time  ROS: Patient denies fever, rash, sore throat, blurred vision, nausea, vomiting, diarrhea, cough, shortness of breath or chest pain, joint or back pain, headache, or mood change.    Objective:   No results found. No results for input(s): WBC, HGB, HCT, PLT in the last 72 hours. Recent Labs    03/09/18 0505  CREATININE 1.25*    Intake/Output Summary (Last 24 hours) at 03/10/2018 0837 Last data filed at 03/09/2018 1844 Gross per 24 hour  Intake 322 ml  Output -  Net 322 ml     Physical Exam: Vital Signs Blood pressure 116/65, pulse 74, temperature 98.8 F (37.1 C), temperature source Oral, resp. rate 17, height 5\' 4"  (1.626 m), weight 85.7 kg, SpO2 92 %.  Constitutional: No distress . Vital signs reviewed. HEENT: EOMI, oral membranes moist Neck: supple Cardiovascular: RRR without murmur. No JVD    Respiratory: CTA Bilaterally without wheezes or rales. Normal effort    GI: BS +, non-tender, non-distended  Musc: Edema left hand and foot. Neurological: Alert. Dysarthria. Phonation comes and goes Follows commands. Left inattention persists Sensation absent to light touch left upper and lower extremity. Sensitive with movement though Motor: LUE/LLE: 0/5 proximal to distal,stble exam Right lower extremity: 2/5 hip flexion, knee extension, 4+/5 ankle dorsiflexion,stable No increase in tone Skin: Skin iswarmand dry. Psych: cooperative but flat  Assessment/Plan: 1. Functional deficits secondary to right MCA infarction with right M1 occlusion status post minimal recannulization after mechanical thrombectomy as well as history of left MCA infarction April 2019. which require 3+ hours per day of interdisciplinary therapy in a comprehensive inpatient rehab setting.  Physiatrist is providing close team supervision and 24 hour management of  active medical problems listed below.  Physiatrist and rehab team continue to assess barriers to discharge/monitor patient progress toward functional and medical goals  Care Tool:  Bathing    Body parts bathed by patient: Left arm, Chest, Abdomen, Face, Front perineal area, Left upper leg, Right upper leg   Body parts bathed by helper: Buttocks, Left lower leg, Right lower leg, Right arm     Bathing assist Assist Level: Maximal Assistance - Patient 24 - 49%     Upper Body Dressing/Undressing Upper body dressing   What is the patient wearing?: Pull over shirt    Upper body assist Assist Level: Maximal Assistance - Patient 25 - 49%    Lower Body Dressing/Undressing Lower body dressing      What is the patient wearing?: Pants, Incontinence brief     Lower body assist Assist for lower body dressing: Total Assistance - Patient < 25%(supine rolling)     Toileting Toileting    Toileting assist Assist for toileting: Total Assistance - Patient < 25%     Transfers Chair/bed transfer  Transfers assist     Chair/bed transfer assist level: Total Assistance - Patient < 25%(sliding board)     Locomotion Ambulation   Ambulation assist   Ambulation activity did not occur: Safety/medical concerns  Assist level: Total Assistance - Patient < 25% Assistive device: Lite Gait Max distance: 35   Walk 10 feet activity   Assist  Walk 10 feet activity did not occur: Safety/medical concerns  Assist level: Total Assistance - Patient < 25% Assistive device: Lite Gait   Walk 50 feet activity   Assist Walk 50 feet with 2 turns activity did not occur: Safety/medical concerns  Walk 150 feet activity   Assist Walk 150 feet activity did not occur: Safety/medical concerns         Walk 10 feet on uneven surface  activity   Assist Walk 10 feet on uneven surfaces activity did not occur: Safety/medical concerns         Wheelchair     Assist   Type  of Wheelchair: Manual    Wheelchair assist level: Minimal Assistance - Patient > 75% Max wheelchair distance: 120    Wheelchair 50 feet with 2 turns activity    Assist        Assist Level: Minimal Assistance - Patient > 75%   Wheelchair 150 feet activity     Assist Wheelchair 150 feet activity did not occur: Safety/medical concerns        Medical Problem List and Plan: 1.Left side weaknesssecondary to right MCA infarction with right M1 occlusion status post minimal recannulization after mechanical thrombectomy as well as history of left MCA infarction April 2019. Advise 30 day cardiac monitor on discharge  -Continue CIR therapies including PT, OT, and SLP   PRAFO/WHO nightly  -SNF pending 2. DVT Prophylaxis/Anticoagulation: Subcutaneous Lovenox. 3. Pain Management:Tylenol as needed  -?gabapentin trial 4. Mood:Aricept 5 mg daily 5. Neuropsych: This patienthe iscapable of making decisions on herown behalf. 6. Skin/Wound Care:Routine skin checks 7. Fluids/Electrolytes/Nutrition:Routine ins and outs  IVF [place] on 11/27  D2 thins, advance diet as tolerated  Intake fair 8. Cardiomyopathy/chronic combined diastolic congestive heart failure. Monitor for any signs of fluid overload. 9. Hypertension. Coreg 12.5 mg twice a day. Monitor with increased mobility Vitals:   03/10/18 0550 03/10/18 0720  BP: 116/65   Pulse: 74   Resp: 17   Temp: 98.8 F (37.1 C)   SpO2: 96% 92%   Controlled on 12/13 10. COPD/tobacco abuse. Continue inhalers. Provide counseling 11. Hyperlipidemia. Lipitor 12. Acute on chronic anemia.   Hemoglobin 8.6 on 12/9  Continue to monitor 13.  Hx gout left great toe does not appear acutely inflammed - tylenol prn, may need foot orthosis if pt has pain with WB 14.  CKD ?Stage II  Creatinine 1.17 on 12/9  Encouraging fluids  Continue to monitor 15.  Hypoalbuminemia  Supplement initiated on 11/25 16.  Hypokalemia  Potassium 3.7 on  12/9  KCL 79meq qd 12/6  Follow up labs today LOS: 22 days A FACE TO [place] 03/10/2018, 8:37 AM

## 2018-03-10 NOTE — Progress Notes (Signed)
Occupational Therapy Session Note  Patient Details  Name: Brandy [name] MRN: [id number] Date of Birth: [birth year]  Today's Date: 03/10/2018 OT Individual Time: 1004-1102 OT Individual Time Calculation (min): 58 min    Short Term Goals: Week 3:  OT Short Term Goal 1 (Week 3): Pt will complete sliding board transfer from bed to wheelchair with max assist. OT Short Term Goal 2 (Week 3): Pt will maintain dynamic sitting balance during UB bathing with mod assist.  OT Short Term Goal 3 (Week 3): Pt will perform toilet transfer with total assist +2 (pt 25%) using the Stedy.  OT Short Term Goal 4 (Week 3): Pt will complete sit to stand with total assist +2 (pt 35%) during LB selfcare tasks.   Skilled Therapeutic Interventions/Progress Updates:    Pt completed bathing and dressing sit to supine during session.  Total assist for sliding board transfer from wheelchair to the EOB to the right.  Once on the bed, had pt work on undressing her UB in order to complete UB bathing.  Min assist for static sitting balance with RUE supported on bed.  Once she had to complete any task with the RUE, she would immediately fall to the left, with max assist needed to correct.  Overall max assist for UB bathing and dressing.  Had pt transition to supine with mod assist to work on all LB bathing and dressing.  Elevated her HOB in order for her to wash her peri area with setup.  Max assist for rolling supine with HOB flat for washing her buttocks.  Total assist for washing lower legs as well as for dressing.  Pt finished session with transfer back to the tilt in space wheelchair with max assist via sliding board to the left.  Pt left in wheelchair with alarm in place and call button and phone in reach.    Therapy Documentation Precautions:  Precautions Precautions: Fall Precaution Comments: mild Lt inattention, left hemiplegia, pusher to the left, motor impersistence Restrictions Weight Bearing Restrictions: No    Pain: Pain Assessment Pain Scale: Faces Pain Score: 0-No pain ADL: See Care Tool Section for some details of ADLs  Therapy/Group: Individual Therapy  Albert Devaul OTR/L 03/10/2018, 12:22 PM

## 2018-03-10 NOTE — Progress Notes (Signed)
Physical Therapy Session Note  Patient Details  Name: Brandy [name] MRN: [id number] Date of Birth: [birth year]  Today's Date: 03/10/2018 PT Individual Time: 0900-1000 PT Individual Time Calculation (min): 60 min   Short Term Goals: Week 3:  PT Short Term Goal 1 (Week 3): PT will transfer to and from [facility] with max assist consistently   PT Short Term Goal 2 (Week 3): Pt will maintian sitting balance with min assist x 3 min  PT Short Term Goal 3 (Week 3): Pt will propell WC 140f with min assist using hemi technique  PT Short Term Goal 4 (Week 3): Pt will ambulate at rail in hall x 5 with with max + 2 assist   Skilled Therapeutic Interventions/Progress Updates:    Pt received supine in bed and agreeable to PT.  Max assist for donning shoes in supine. Pt max assist to roll R sidelying and sidelying to sit. Pt max assist initially to maintain static sitting balance at EOB; mod assist once pt attained midline. [facility] from SPT. Mod verbal cuing for hand placement and for use of RUE/RLE to push up and over on board.   Standing frame in dayroom ~7-10 mins, 2 sets for neuromuscular re-ed, increased postural control, and LE strength/endurance. 1st set: pt sorted chips into 3 piles by color using R UE. Assisted R shoulder flexion x5 with focus on maintaining midline and upright posture. 2nd set: pt reaching R and laterally with RUE to grab pegs from PT and place into peg board, then returning pegs to PT. Verbal and tactile cues throughout exercise to maintain midline, maintain upright posture, and attention to task.  Ended session with pt seated in WC, call Rikard within reach, and all needs met.  Therapy Documentation Precautions:  Precautions Precautions: Fall Precaution Comments: mild Lt inattention, left hemiplegia, pusher to the left, motor impersistence Restrictions Weight Bearing Restrictions: No   Vital Signs: Oxygen Therapy SpO2: 92 % O2 Device: Room  Air Pain: Pain Assessment Pain Scale: 0-10 Pain Score: 0-No pain   Therapy/Group: Individual Therapy  AAmador Cunas12/13/2019, 10:30 AM

## 2018-03-11 ENCOUNTER — Inpatient Hospital Stay (HOSPITAL_COMMUNITY): Payer: Medicare Other | Admitting: Physical Therapy

## 2018-03-11 NOTE — Progress Notes (Signed)
Physical Therapy Session Note  Patient Details  Name: Brandy [name] MRN: [id number] Date of Birth: 1940/12/13  Today's Date: 03/11/2018 PT Individual Time: 1300-1345 PT Individual Time Calculation (min): 45 min   Short Term Goals:  Week 3:  PT Short Term Goal 1 (Week 3): PT will transfer to and from [facility] with max assist consistently   PT Short Term Goal 2 (Week 3): Pt will maintian sitting balance with min assist x 3 min  PT Short Term Goal 3 (Week 3): Pt will propell WC 167f with min assist using hemi technique  PT Short Term Goal 4 (Week 3): Pt will ambulate at rail in hall x 5 with with max + 2 assist  Week 4:     Skilled Therapeutic Interventions/Progress Updates:   Pt received sitting in WC and agreeable to PT  SB transfers to the R and the L [facility] from PT each direction. Pt able to improve technique to the R with max cues for and facilitation for trunk, UE and LE positioning, posture, and cues for midline orientation.   Lateral scooting EOB with mod assist overall from PT with max cues for technique, sequencing and posture. Sit>supine with mod assist to manage the LLE supine>sit with total assist from PT. Rolling R and L with max assist from PT to faciliate activaiton of L side trunk musculature.   Supine NMR: bridge 2 x 5.lateral scoot with mod-max facilitation of trunk and pelvic movement, dynamic reversals in sidelying to activate deep core and trunk musculature through rolling to the R and return to supine. Supine stabilizing reversals for lumbar rotation. Hip abduction/ adduction 2 x 10  And LLE flexion and extension in synergistic pattern with mild activation noted in the LLE into extension.   [facility] returned to room and [facility] and all needs met.        Therapy Documentation Precautions:  Precautions Precautions: Fall Precaution Comments: mild Lt inattention, left hemiplegia, pusher to the left, motor  impersistence Restrictions Weight Bearing Restrictions: No Pain: 0/10    Therapy/Group: Individual Therapy  ALorie Phenix12/14/2019, 1:53 PM

## 2018-03-11 NOTE — Progress Notes (Signed)
Physical Therapy Weekly Progress Note  Patient Details  Name: Brandy [name] MRN: [id number] Date of Birth: 06-09-1940  Beginning of progress report period: March 03, 2018 End of progress report period: March 11, 2018  Today's Date: 03/11/2018   Patient has met 3 of 4 short term goals.  Pt continues to make very slow progress towards short term and long term goals. She has progressed slightly to max assist SB transfers to the R with max cues for sequencing of movements. Her sitting balance has improved mildly as she is able to sit unsupported with as little as supervision assist in a quiet environment. She has initiated WC mobility and is abl to propell WC with min assist using hemi technique with max cues for attention to task. Mid line orientation, sitting balance, flaccid LLE and LUE and inability to divide attention are this pt's greatest limiting factors.   Patient continues to demonstrate the following deficits muscle weakness, muscle joint tightness and muscle paralysis, decreased cardiorespiratoy endurance, impaired timing and sequencing, abnormal tone, unbalanced muscle activation, motor apraxia and decreased motor planning, decreased visual perceptual skills and field cut, decreased midline orientation and decreased attention to left, decreased initiation, decreased attention, decreased awareness, decreased problem solving, decreased safety awareness, decreased memory and delayed processing and decreased sitting balance, decreased standing balance, decreased postural control, hemiplegia and decreased balance strategies and therefore will continue to benefit from skilled PT intervention to increase functional independence with mobility.  Patient progressing toward long term goals..  Continue plan of care.  PT Short Term Goals Week 3:  PT Short Term Goal 1 (Week 3): PT will transfer to and from [facility] with max assist consistently   PT Short Term Goal 1 - Progress (Week 3): Met PT Short  Term Goal 2 (Week 3): Pt will maintian sitting balance with min assist x 3 min  PT Short Term Goal 2 - Progress (Week 3): Met PT Short Term Goal 3 (Week 3): Pt will propell WC 146f with min assist using hemi technique  PT Short Term Goal 3 - Progress (Week 3): Met PT Short Term Goal 4 (Week 3): Pt will ambulate at rail in hall x 5 with with max + 2 assist  PT Short Term Goal 4 - Progress (Week 3): Not met Week 4:  PT Short Term Goal 1 (Week 4): STG=LTG due to ELOS      Therapy Documentation Precautions:  Precautions Precautions: Fall Precaution Comments: mild Lt inattention, left hemiplegia, pusher to the left, motor impersistence Restrictions Weight Bearing Restrictions: No Vital Signs: Therapy Vitals Temp: 98.6 F (37 C) Temp Source: Oral Pulse Rate: 68 Resp: 18 BP: 119/67 Patient Position (if appropriate): Sitting Oxygen Therapy SpO2: 100 % O2 Device: Room Air   Therapy/Group: Individual Therapy  ALorie Phenix12/14/2019, 4:24 PM

## 2018-03-11 NOTE — Progress Notes (Signed)
[facility] PHYSICAL MEDICINE & REHABILITATION PROGRESS NOTE   Subjective/Complaints: No new complaints. Slept well. No pain this morning  ROS: Patient denies fever, rash, sore throat, blurred vision, nausea, vomiting, diarrhea, cough, shortness of breath or chest pain, joint or back pain, headache, or mood change.     Objective:   No results found. Recent Labs    03/10/18 0815  WBC 5.6  HGB 8.8*  HCT 29.7*  PLT 242   Recent Labs    03/09/18 0505 03/10/18 0815  NA  --  143  K  --  4.0  CL  --  112*  CO2  --  23  GLUCOSE  --  110*  BUN  --  23  CREATININE 1.25* 1.26*  CALCIUM  --  8.8*    Intake/Output Summary (Last 24 hours) at 03/11/2018 0850 Last data filed at 03/10/2018 1742 Gross per 24 hour  Intake 480 ml  Output -  Net 480 ml     Physical Exam: Vital Signs Blood pressure 132/80, pulse 76, temperature 98.3 F (36.8 C), temperature source Oral, resp. rate 15, height 5\' 4"  (1.626 m), weight 85.7 kg, SpO2 99 %.  Constitutional: No distress . Vital signs reviewed. HEENT: EOMI, oral membranes moist Neck: supple Cardiovascular: RRR without murmur. No JVD    Respiratory: CTA Bilaterally without wheezes or rales. Normal effort    GI: BS +, non-tender, non-distended  Musc: Edema left hand and foot. Neurological: Alert. Dysarthria. Fair phonation Follows commands. Left inattention persists Sensation absent to light touch left upper and lower extremity. Sensitive with movement though Motor: LUE/LLE: 0/5 proximal to distal,stble exam Right lower extremity: 2/5 hip flexion, knee extension, 4+/5 ankle dorsiflexion,stable No increase in tone Skin: Skin iswarmand dry. Psych: cooperative  Assessment/Plan: 1. Functional deficits secondary to right MCA infarction with right M1 occlusion status post minimal recannulization after mechanical thrombectomy as well as history of left MCA infarction April 2019. which require 3+ hours per day of interdisciplinary  therapy in a comprehensive inpatient rehab setting.  Physiatrist is providing close team supervision and 24 hour management of active medical problems listed below.  Physiatrist and rehab team continue to assess barriers to discharge/monitor patient progress toward functional and medical goals  Care Tool:  Bathing    Body parts bathed by patient: Left arm, Chest, Abdomen, Face, Front perineal area, Left upper leg, Right upper leg   Body parts bathed by helper: Buttocks, Left lower leg, Right lower leg, Right arm     Bathing assist Assist Level: Maximal Assistance - Patient 24 - 49%     Upper Body Dressing/Undressing Upper body dressing   What is the patient wearing?: Pull over shirt    Upper body assist Assist Level: Maximal Assistance - Patient 25 - 49%    Lower Body Dressing/Undressing Lower body dressing      What is the patient wearing?: Pants, Incontinence brief     Lower body assist Assist for lower body dressing: Total Assistance - Patient < 25%     Toileting Toileting    Toileting assist Assist for toileting: Total Assistance - Patient < 25%     Transfers Chair/bed transfer  Transfers assist     Chair/bed transfer assist level: Total Assistance - Patient < 25%     Locomotion Ambulation   Ambulation assist   Ambulation activity did not occur: Safety/medical concerns  Assist level: Total Assistance - Patient < 25% Assistive device: Lite Gait Max distance: 35   Walk 10 feet  activity   Assist  Walk 10 feet activity did not occur: Safety/medical concerns  Assist level: Total Assistance - Patient < 25% Assistive device: Lite Gait   Walk 50 feet activity   Assist Walk 50 feet with 2 turns activity did not occur: Safety/medical concerns         Walk 150 feet activity   Assist Walk 150 feet activity did not occur: Safety/medical concerns         Walk 10 feet on uneven surface  activity   Assist Walk 10 feet on uneven surfaces  activity did not occur: Safety/medical concerns         Wheelchair     Assist   Type of Wheelchair: Manual    Wheelchair assist level: Minimal Assistance - Patient > 75% Max wheelchair distance: 120    Wheelchair 50 feet with 2 turns activity    Assist        Assist Level: Minimal Assistance - Patient > 75%   Wheelchair 150 feet activity     Assist Wheelchair 150 feet activity did not occur: Safety/medical concerns        Medical Problem List and Plan: 1.Left side weaknesssecondary to right MCA infarction with right M1 occlusion status post minimal recannulization after mechanical thrombectomy as well as history of left MCA infarction April 2019. Advise 30 day cardiac monitor on discharge  -Continue CIR therapies including PT, OT, and SLP   PRAFO/WHO nightly  -SNF pending 2. DVT Prophylaxis/Anticoagulation: Subcutaneous Lovenox. 3. Pain Management:Tylenol as needed  -?gabapentin trial 4. Mood:Aricept 5 mg daily 5. Neuropsych: This patienthe iscapable of making decisions on herown behalf. 6. Skin/Wound Care:Routine skin checks 7. Fluids/Electrolytes/Nutrition:Routine ins and outs  IVF [place] on 11/27  D2 thins, advance diet as tolerated  Intake reasonable 8. Cardiomyopathy/chronic combined diastolic congestive heart failure. Monitor for any signs of fluid overload. 9. Hypertension. Coreg 12.5 mg twice a day. Monitor with increased mobility Vitals:   03/11/18 0537 03/11/18 0758  BP: 132/80   Pulse: 76   Resp: 15   Temp: 98.3 F (36.8 C)   SpO2: 100% 99%   Controlled  10. COPD/tobacco abuse. Continue inhalers. Provide counseling 11. Hyperlipidemia. Lipitor 12. Acute on chronic anemia.   Hemoglobin 8.8 12/13  Continue to monitor 13.  Hx gout left great toe does not appear acutely inflammed - tylenol prn, may need foot orthosis if pt has pain with WB 14.  CKD ?Stage II  Creatinine 1.26 12/13---at baseline  Encouraging fluids  Continue  to monitor 15.  Hypoalbuminemia  Supplement initiated on 11/25 16.  Hypokalemia  Potassium 4.0 on 12/13  KCL 68meq qd 12/6   LOS: 23 days A FACE TO FACE EVALUATION WAS PERFORMED  Meredith Staggers 03/11/2018, 8:50 AM

## 2018-03-12 NOTE — Progress Notes (Signed)
[facility] PHYSICAL MEDICINE & REHABILITATION PROGRESS NOTE   Subjective/Complaints: No pain today. Likes the hospital food! Friend at bedside  ROS: Patient denies fever, rash, sore throat, blurred vision, nausea, vomiting, diarrhea, cough, shortness of breath or chest pain, joint or back pain, headache, or mood change.   Objective:   No results found. Recent Labs    03/10/18 0815  WBC 5.6  HGB 8.8*  HCT 29.7*  PLT 242   Recent Labs    03/10/18 0815  NA 143  K 4.0  CL 112*  CO2 23  GLUCOSE 110*  BUN 23  CREATININE 1.26*  CALCIUM 8.8*    Intake/Output Summary (Last 24 hours) at 03/12/2018 1517 Last data filed at 03/12/2018 6314 Gross per 24 hour  Intake 100 ml  Output -  Net 100 ml     Physical Exam: Vital Signs Blood pressure 116/69, pulse 71, temperature 98.7 F (37.1 C), temperature source Oral, resp. rate 18, height 5\' 4"  (1.626 m), weight 85.7 kg, SpO2 98 %.  Constitutional: No distress . Vital signs reviewed. HEENT: EOMI, oral membranes moist Neck: supple Cardiovascular: RRR without murmur. No JVD    Respiratory: CTA Bilaterally without wheezes or rales. Normal effort    GI: BS +, non-tender, non-distended  Musc: Edema left hand and foot. Neurological: Alert. Dysarthria. Fair phonation Follows commands. Left inattention persists Sensation absent to light touch left upper and lower extremity. Sensitive with movement though Motor: LUE/LLE: 0/5 proximal to distal,stble exam Right lower extremity: 2/5 hip flexion, knee extension, 4+/5 ankle dorsiflexion,stable No increase in tone Skin: Skin iswarmand dry. Psych: cooperative  Assessment/Plan: 1. Functional deficits secondary to right MCA infarction with right M1 occlusion status post minimal recannulization after mechanical thrombectomy as well as history of left MCA infarction April 2019. which require 3+ hours per day of interdisciplinary therapy in a comprehensive inpatient rehab  setting.  Physiatrist is providing close team supervision and 24 hour management of active medical problems listed below.  Physiatrist and rehab team continue to assess barriers to discharge/monitor patient progress toward functional and medical goals  Care Tool:  Bathing    Body parts bathed by patient: Left arm, Chest, Abdomen, Face, Front perineal area, Left upper leg, Right upper leg   Body parts bathed by helper: Buttocks, Left lower leg, Right lower leg, Right arm     Bathing assist Assist Level: Maximal Assistance - Patient 24 - 49%     Upper Body Dressing/Undressing Upper body dressing   What is the patient wearing?: Pull over shirt    Upper body assist Assist Level: Maximal Assistance - Patient 25 - 49%    Lower Body Dressing/Undressing Lower body dressing      What is the patient wearing?: Pants, Incontinence brief     Lower body assist Assist for lower body dressing: Total Assistance - Patient < 25%     Toileting Toileting    Toileting assist Assist for toileting: Total Assistance - Patient < 25%     Transfers Chair/bed transfer  Transfers assist     Chair/bed transfer assist level: Maximal Assistance - Patient 25 - 49%     Locomotion Ambulation   Ambulation assist   Ambulation activity did not occur: Safety/medical concerns  Assist level: Total Assistance - Patient < 25% Assistive device: Lite Gait Max distance: 35   Walk 10 feet activity   Assist  Walk 10 feet activity did not occur: Safety/medical concerns  Assist level: Total Assistance - Patient < 25% Assistive  device: Lite Gait   Walk 50 feet activity   Assist Walk 50 feet with 2 turns activity did not occur: Safety/medical concerns         Walk 150 feet activity   Assist Walk 150 feet activity did not occur: Safety/medical concerns         Walk 10 feet on uneven surface  activity   Assist Walk 10 feet on uneven surfaces activity did not occur: Safety/medical  concerns         Wheelchair     Assist   Type of Wheelchair: Manual    Wheelchair assist level: Minimal Assistance - Patient > 75% Max wheelchair distance: 120    Wheelchair 50 feet with 2 turns activity    Assist        Assist Level: Minimal Assistance - Patient > 75%   Wheelchair 150 feet activity     Assist Wheelchair 150 feet activity did not occur: Safety/medical concerns        Medical Problem List and Plan: 1.Left side weaknesssecondary to right MCA infarction with right M1 occlusion status post minimal recannulization after mechanical thrombectomy as well as history of left MCA infarction April 2019. Advise 30 day cardiac monitor on discharge  -Continue CIR therapies including PT, OT, and SLP   PRAFO/WHO nightly  -SNF pending 2. DVT Prophylaxis/Anticoagulation: Subcutaneous Lovenox. 3. Pain Management:Tylenol as needed  -?gabapentin trial 4. Mood:Aricept 5 mg daily 5. Neuropsych: This patienthe iscapable of making decisions on herown behalf. 6. Skin/Wound Care:Routine skin checks 7. Fluids/Electrolytes/Nutrition:Routine ins and outs  IVF [place] on 11/27  D2 thins, advance diet as tolerated  Intake reasonable at present 8. Cardiomyopathy/chronic combined diastolic congestive heart failure. Monitor for any signs of fluid overload. 9. Hypertension. Coreg 12.5 mg twice a day. Monitor with increased mobility Vitals:   03/12/18 0806 03/12/18 1422  BP:  116/69  Pulse:  71  Resp:  18  Temp:  98.7 F (37.1 C)  SpO2: 94% 98%   Controlled 12/15 10. COPD/tobacco abuse. Continue inhalers. Provide counseling 11. Hyperlipidemia. Lipitor 12. Acute on chronic anemia.   Hemoglobin 8.8 12/13  Continue to monitor 13.  Hx gout left great toe does not appear acutely inflammed - tylenol prn, may need foot orthosis if pt has pain with WB 14.  CKD ?Stage II  Creatinine 1.26 12/13---at baseline  Encouraging fluids  Continue to monitor 15.   Hypoalbuminemia  Supplement initiated on 11/25 16.  Hypokalemia  Potassium 4.0 on 12/13  KCL 4meq qd 12/6   LOS: 24 days A FACE TO FACE EVALUATION WAS PERFORMED  Meredith Staggers 03/12/2018, 3:17 PM

## 2018-03-13 ENCOUNTER — Inpatient Hospital Stay (HOSPITAL_COMMUNITY): Payer: Medicare Other | Admitting: Physical Therapy

## 2018-03-13 ENCOUNTER — Inpatient Hospital Stay (HOSPITAL_COMMUNITY): Payer: Medicare Other | Admitting: Occupational Therapy

## 2018-03-13 ENCOUNTER — Inpatient Hospital Stay (HOSPITAL_COMMUNITY): Payer: Medicare Other

## 2018-03-13 NOTE — Progress Notes (Signed)
Occupational Therapy Session Note  Patient Details  Name: SACHE SANE MRN: [id number] Date of Birth: Jul 19, 1940  Today's Date: 03/13/2018 OT Individual Time: 5035-4656 OT Individual Time Calculation (min): 75 min    Short Term Goals: Week 3:  OT Short Term Goal 1 (Week 3): Pt will complete sliding board transfer from bed to wheelchair with max assist. OT Short Term Goal 2 (Week 3): Pt will maintain dynamic sitting balance during UB bathing with mod assist.  OT Short Term Goal 3 (Week 3): Pt will perform toilet transfer with total assist +2 (pt 25%) using the [place].  OT Short Term Goal 4 (Week 3): Pt will complete sit to stand with total assist +2 (pt 35%) during LB selfcare tasks.   Skilled Therapeutic Interventions/Progress Updates:    Patient in bed upon arrival, denies pain and is happy to participate with adl.  LB bathing and dressing - completed in bed, LSP, max A/dependent UB bathing and dressing - seated in w/c, mod/max A Grooming - washing face and brushing hair min A, oral care max A with suction brush Rolling in bed - mod A to left and max A to right, Supine to SSP max A, SB transfer bed to w/c with max A of one, CG of second person Tolerated unsupported sitting and posture activities with mod A to maintain for 10 minutes with focus on weight shift, shoulder/scapular position, rotation, awareness of left side/visual scanning Completed PROM/AAROM left UE seated in w/c, increased IR shortening/tightness, able to achieve full PROM with inhibition of IR Patient seated in w/c with seat belt alarm set, call [place]  In reach   Therapy Documentation Precautions:  Precautions Precautions: Fall Precaution Comments: mild Lt inattention, left hemiplegia, pusher to the left, motor impersistence Restrictions Weight Bearing Restrictions: No General:   Vital Signs: Therapy Vitals Temp: 98.7 F (37.1 C) Temp Source: Oral Pulse Rate: 87 Resp: 15 BP: (!) 148/81 Patient Position  (if appropriate): Lying Oxygen Therapy SpO2: 100 % Pain: Pain Assessment Pain Scale: 0-10 Pain Score: 0-No pain   Therapy/Group: Individual Therapy  Carlos Levering 03/13/2018, 9:31 AM

## 2018-03-13 NOTE — Progress Notes (Signed)
Physical Therapy Session Note  Patient Details  Name: Brandy [name] MRN: [id number] Date of Birth: [birth year]  Today's Date: 03/13/2018 PT Individual Time: 0918-1000 PT Individual Time Calculation (min): 42 min   Short Term Goals: Week 4:  PT Short Term Goal 1 (Week 4): STG=LTG due to ELOS  Skilled Therapeutic Interventions/Progress Updates: Pt presented in TIS agreeable to therapy. Pt transported to rehab gym and performed SB transfer to [place] with pt able to initiate some lateral shift to R. Pt participated in sitting and reaching activities placing cards on board. Pt was able to accurately place 9/9 cards on board reaching to R and return to midline without verbal cues. Participated in lateral scooting L/R on mat requiring maxA on R and dependent to L. Noted as pt became more fatigued pt required moderate cues for decreasing L lateral lean with x 1 occurrence of pt requiring maxA for recovery. Pt returned to TIS via dependent SB transfer and transported to [employer]. Pt participated in [employer] mode A and B with pt able to accurately hit buttons on mode A for 60 seconds. On second trial in mode B pt required moderate cues for hitting correct button and decreased initiation. Pt transferred back to room and remained in TIS with belt alarm on, call [place] within reach and needs met.       Therapy Documentation Precautions:  Precautions Precautions: Fall Precaution Comments: mild Lt inattention, left hemiplegia, pusher to the left, motor impersistence Restrictions Weight Bearing Restrictions: No General:   Vital Signs:  Pain: Pain Assessment Pain Scale: 0-10 Pain Score: 0-No pain   Therapy/Group: Individual Therapy  Lizvette Lightsey  Dylon Correa, PTA  03/13/2018, 12:09 PM

## 2018-03-13 NOTE — Progress Notes (Signed)
Speech Language Pathology Daily Session Note  Patient Details  Name: Brandy [name] MRN: [id number] Date of Birth: [birth year]  Today's Date: 03/13/2018 SLP Individual Time: 1230-1300 / 1415-1445 SLP Individual Time Calculation (min): 30 min and 30 min  Short Term Goals: Week 4: SLP Short Term Goal 1 (Week 4): STG=LTG due to remaining length of stay, pending SNF placement   Skilled Therapeutic Interventions: 1# Skilled ST services focused on swallow and cognitive skills. SLP facilitated PO consumption of regular textured foods and thin liquid via straw, pt demonstrated  No overt s/s aspiration and required supervision A verbal cues for use of swallow strategies. SLP upgraded thin liquids to allow straw and cup administration. SLP facilitated basic problem solving navigating tray during self feeding task required min A verbal cues and locating items left of midline required min A cues. Pt was left in room with call [place] within reach and bed alarm set. Recommend to continue skilled ST services.   2# Skilled ST services focused on cognitive skills. SLP facilitated basic problem solving skills utilizing simple PEG task, pt required min A verbal cues and min-supervision A verbal cues to scan left of mideline. Pt required initial cues to reduce impulsivity and plan ahead to reduce errors in problem solving. Pt expressed shoulder pain, pt's brother notified nursing staff. Pt demonstrated sustained attention in 15 minutes intervals with min A verbal cues, however cuing increased to mod A verbal cues in 5 minute intervals at end of session, likely distracted by pain and awaiting nurse's arrival.  Pt was left in room with call [place] within reach and chair alarm set. Recommend to continue skilled ST services.      Pain Pain Assessment Pain Scale: 0-10 Pain Score: 0-No pain  Therapy/Group: Individual Therapy  Orvetta Danielski  [facility] 03/13/2018, 8:56 AM

## 2018-03-13 NOTE — Progress Notes (Signed)
[place] PHYSICAL MEDICINE & REHABILITATION PROGRESS NOTE   Subjective/Complaints: Patient seen lying in bed this morning.  She states she slept well overnight.  She states she had a good weekend.  ROS: Denies CP, SOB, N/V/D  Objective:   No results found. No results for input(s): WBC, HGB, HCT, PLT in the last 72 hours. No results for input(s): NA, K, CL, CO2, GLUCOSE, BUN, CREATININE, CALCIUM in the last 72 hours.  Intake/Output Summary (Last 24 hours) at 03/13/2018 0914 Last data filed at 03/13/2018 0900 Gross per 24 hour  Intake 440 ml  Output -  Net 440 ml     Physical Exam: Vital Signs Blood pressure (!) 148/81, pulse 87, temperature 98.7 F (37.1 C), temperature source Oral, resp. rate 15, height 5\' 4"  (1.626 m), weight 85.7 kg, SpO2 100 %.  Constitutional: No distress . Vital signs reviewed. HENT: Normocephalic.  Atraumatic. Eyes: EOMI. No discharge. Cardiovascular: RRR. No JVD. Respiratory: CTA Bilaterally. Normal effort. GI: BS +. Non-distended. Musc: Edema left hand and foot. Neurological: Alert. Dysarthria Left inattention improving Sensation absent to light touch left upper and lower extremity.  Motor: LUE/LLE: 0/5 proximal to distal Right lower extremity: 2/5 hip flexion, knee extension, 4+/5 ankle dorsiflexion,stable No increase in tone Skin: Skin iswarmand dry. Psych: cooperative  Assessment/Plan: 1. Functional deficits secondary to right MCA infarction with right M1 occlusion status post minimal recannulization after mechanical thrombectomy as well as history of left MCA infarction April 2019. which require 3+ hours per day of interdisciplinary therapy in a comprehensive inpatient rehab setting.  Physiatrist is providing close team supervision and 24 hour management of active medical problems listed below.  Physiatrist and rehab team continue to assess barriers to discharge/monitor patient progress toward functional and medical goals  Care  Tool:  Bathing    Body parts bathed by patient: Left arm, Chest, Abdomen, Face, Front perineal area, Left upper leg, Right upper leg   Body parts bathed by helper: Buttocks, Left lower leg, Right lower leg, Right arm     Bathing assist Assist Level: Maximal Assistance - Patient 24 - 49%     Upper Body Dressing/Undressing Upper body dressing   What is the patient wearing?: Pull over shirt    Upper body assist Assist Level: Maximal Assistance - Patient 25 - 49%    Lower Body Dressing/Undressing Lower body dressing      What is the patient wearing?: Pants, Incontinence brief     Lower body assist Assist for lower body dressing: Total Assistance - Patient < 25%     Toileting Toileting    Toileting assist Assist for toileting: Total Assistance - Patient < 25%     Transfers Chair/bed transfer  Transfers assist     Chair/bed transfer assist level: Maximal Assistance - Patient 25 - 49%     Locomotion Ambulation   Ambulation assist   Ambulation activity did not occur: Safety/medical concerns  Assist level: Total Assistance - Patient < 25% Assistive device: Lite Gait Max distance: 35   Walk 10 feet activity   Assist  Walk 10 feet activity did not occur: Safety/medical concerns  Assist level: Total Assistance - Patient < 25% Assistive device: Lite Gait   Walk 50 feet activity   Assist Walk 50 feet with 2 turns activity did not occur: Safety/medical concerns         Walk 150 feet activity   Assist Walk 150 feet activity did not occur: Safety/medical concerns  Walk 10 feet on uneven surface  activity   Assist Walk 10 feet on uneven surfaces activity did not occur: Safety/medical concerns         Wheelchair     Assist   Type of Wheelchair: Manual    Wheelchair assist level: Minimal Assistance - Patient > 75% Max wheelchair distance: 120    Wheelchair 50 feet with 2 turns activity    Assist        Assist Level:  Minimal Assistance - Patient > 75%   Wheelchair 150 feet activity     Assist Wheelchair 150 feet activity did not occur: Safety/medical concerns        Medical Problem List and Plan: 1.Left side weaknesssecondary to right MCA infarction with right M1 occlusion status post minimal recannulization after mechanical thrombectomy as well as history of left MCA infarction April 2019. Advise 30 day cardiac monitor on discharge  -Continue CIR  PRAFO/WHO nightly  -SNF pending 2. DVT Prophylaxis/Anticoagulation: Subcutaneous Lovenox. 3. Pain Management:Tylenol as needed 4. Mood:Aricept 5 mg daily 5. Neuropsych: This patienthe iscapable of making decisions on herown behalf. 6. Skin/Wound Care:Routine skin checks 7. Fluids/Electrolytes/Nutrition:Routine ins and outs  IVF [name] on 11/27  D2 thins, advance diet as tolerated  Intake reasonable at present 8. Cardiomyopathy/chronic combined diastolic congestive heart failure. Monitor for any signs of fluid overload. 9. Hypertension. Coreg 12.5 mg twice a day. Monitor with increased mobility Vitals:   03/12/18 2000 03/13/18 0601  BP: 122/66 (!) 148/81  Pulse: 73 87  Resp: 16 15  Temp: 99.6 F (37.6 C) 98.7 F (37.1 C)  SpO2:  100%   Labile on 12/16 10. COPD/tobacco abuse. Continue inhalers. Provide counseling 11. Hyperlipidemia. Lipitor 12. Acute on chronic anemia.   Hemoglobin 8.8 on 12/13  Continue to monitor 13.  Hx gout left great toe does not appear acutely inflammed - tylenol prn, may need foot orthosis if pt has pain with WB 14.  CKD ?Stage II  Creatinine 1.26 on 12/13  Encouraging fluids  Continue to monitor 15.  Hypoalbuminemia  Supplement initiated on 11/25 16.  Hypokalemia  Potassium 4.0 on 12/13  KCL 93meq qd 12/6   LOS: 25 days A FACE TO FACE EVALUATION WAS PERFORMED  Brandy [name] Brandy [name] 03/13/2018, 9:14 AM

## 2018-03-13 NOTE — Progress Notes (Signed)
Occupational Therapy Session Note  Patient Details  Name: Brandy [name] MRN: [id number] Date of Birth: 19-Aug-1940  Today's Date: 03/13/2018 OT Individual Time: 1130-1200 OT Individual Time Calculation (min): 30 min    Short Term Goals: Week 1:  OT Short Term Goal 1 (Week 1): Pt will maintain static sitting balance EOB or EOM with min assist in preparation for selfcare tasks.  OT Short Term Goal 1 - Progress (Week 1): Not met OT Short Term Goal 2 (Week 1): Pt will complete UB bathing with min assist in supported sitting for 2/3 sessions.  OT Short Term Goal 2 - Progress (Week 1): Met OT Short Term Goal 3 (Week 1): Pt will complete sit to stand with LB selfcare with total assist +2 Pt 25%.   OT Short Term Goal 3 - Progress (Week 1): Met OT Short Term Goal 4 (Week 1): Family will return demonstrate safe performance of PROM exercises for the left arm and hand.  OT Short Term Goal 4 - Progress (Week 1): Met OT Short Term Goal 5 (Week 1): Pt will complete sliding board transfer from bed to wheelchair with max assist.   OT Short Term Goal 5 - Progress (Week 1): Not met Week 2:  OT Short Term Goal 1 (Week 2):    OT Short Term Goal 1 - Progress (Week 2): Not met OT Short Term Goal 2 (Week 2): Pt will maintain static sitting balance EOB or EOM with min assist in preparation for selfcare tasks.  OT Short Term Goal 2 - Progress (Week 2): Met OT Short Term Goal 3 (Week 2): Pt will maintain dynamic sitting balance during UB bathing with mod assist.  OT Short Term Goal 3 - Progress (Week 2): Not met OT Short Term Goal 4 (Week 2): Pt will complete sit to stand with total assist +2 (pt 35%) during LB selfcare tasks.  OT Short Term Goal 4 - Progress (Week 2): Not met OT Short Term Goal 5 (Week 2): Pt will perform toilet transfer with total assist +2 (pt 25%) using the Stedy.  OT Short Term Goal 5 - Progress (Week 2): Not met Week 3:  OT Short Term Goal 1 (Week 3): Pt will complete sliding board  transfer from bed to wheelchair with max assist. OT Short Term Goal 2 (Week 3): Pt will maintain dynamic sitting balance during UB bathing with mod assist.  OT Short Term Goal 3 (Week 3): Pt will perform toilet transfer with total assist +2 (pt 25%) using the Stedy.  OT Short Term Goal 4 (Week 3): Pt will complete sit to stand with total assist +2 (pt 35%) during LB selfcare tasks.   Skilled Therapeutic Interventions/Progress Updates:    Pt seen this session to facilitate LUE with A/arom table slides and PROM to internal rotators.  Pt has (with gravity eliminated) active elb flex, horiz add, and sh flexion.  Pt returned to room with spouse and belt alarm on.  Therapy Documentation Precautions:  Precautions Precautions: Fall Precaution Comments: mild Lt inattention, left hemiplegia, pusher to the left, motor impersistence Restrictions Weight Bearing Restrictions: No  Pain: Pain Assessment Pain Scale: 0-10 Pain Score: 0-No pain        Therapy/Group: Individual Therapy  SAGUIER,JULIA 03/13/2018, 11:08 AM

## 2018-03-14 ENCOUNTER — Inpatient Hospital Stay (HOSPITAL_COMMUNITY): Payer: Medicare Other

## 2018-03-14 ENCOUNTER — Inpatient Hospital Stay (HOSPITAL_COMMUNITY): Payer: Medicare Other | Admitting: Physical Therapy

## 2018-03-14 NOTE — Progress Notes (Signed)
[place] PHYSICAL MEDICINE & REHABILITATION PROGRESS NOTE   Subjective/Complaints: Patient seen lying in bed this morning.  She states she slept well overnight.  She denies complaints.  ROS: Denies CP, SOB, N/V/D  Objective:   No results found. No results for input(s): WBC, HGB, HCT, PLT in the last 72 hours. No results for input(s): NA, K, CL, CO2, GLUCOSE, BUN, CREATININE, CALCIUM in the last 72 hours.  Intake/Output Summary (Last 24 hours) at 03/14/2018 0809 Last data filed at 03/13/2018 1700 Gross per 24 hour  Intake 540 ml  Output -  Net 540 ml     Physical Exam: Vital Signs Blood pressure 140/70, pulse 73, temperature 98.9 F (37.2 C), temperature source Oral, resp. rate 16, height 5\' 4"  (1.626 m), weight 85.7 kg, SpO2 100 %.  Constitutional: No distress . Vital signs reviewed. HENT: Normocephalic.  Atraumatic. Eyes: EOMI. No discharge. Cardiovascular: RRR.  No JVD. Respiratory: CTA bilaterally.  Normal effort. GI: BS +. Non-distended. Musc: Edema left hand and foot. Neurological: Alert. Dysarthria Left inattention improving Sensation absent to light touch left upper and lower extremity.  Motor: LUE/LLE: 0/5 proximal to distal Right lower extremity: 2/5 hip flexion, knee extension, 4+/5 ankle dorsiflexion,stable No increase in tone Skin: Skin iswarmand dry. Psych: cooperative  Assessment/Plan: 1. Functional deficits secondary to right MCA infarction with right M1 occlusion status post minimal recannulization after mechanical thrombectomy as well as history of left MCA infarction April 2019. which require 3+ hours per day of interdisciplinary therapy in a comprehensive inpatient rehab setting.  Physiatrist is providing close team supervision and 24 hour management of active medical problems listed below.  Physiatrist and rehab team continue to assess barriers to discharge/monitor patient progress toward functional and medical goals  Care  Tool:  Bathing    Body parts bathed by patient: Left arm, Chest, Right upper leg, Face, Front perineal area, Left upper leg   Body parts bathed by helper: Right arm, Abdomen, Buttocks, Right lower leg, Left lower leg     Bathing assist Assist Level: Maximal Assistance - Patient 24 - 49%     Upper Body Dressing/Undressing Upper body dressing   What is the patient wearing?: Pull over shirt    Upper body assist Assist Level: Maximal Assistance - Patient 25 - 49%    Lower Body Dressing/Undressing Lower body dressing      What is the patient wearing?: Pants, Incontinence brief     Lower body assist Assist for lower body dressing: Total Assistance - Patient < 25%     Toileting Toileting    Toileting assist Assist for toileting: Total Assistance - Patient < 25%     Transfers Chair/bed transfer  Transfers assist     Chair/bed transfer assist level: Maximal Assistance - Patient 25 - 49%     Locomotion Ambulation   Ambulation assist   Ambulation activity did not occur: Safety/medical concerns  Assist level: Total Assistance - Patient < 25% Assistive device: Lite Gait Max distance: 35   Walk 10 feet activity   Assist  Walk 10 feet activity did not occur: Safety/medical concerns  Assist level: Total Assistance - Patient < 25% Assistive device: Lite Gait   Walk 50 feet activity   Assist Walk 50 feet with 2 turns activity did not occur: Safety/medical concerns         Walk 150 feet activity   Assist Walk 150 feet activity did not occur: Safety/medical concerns         Walk 10  feet on uneven surface  activity   Assist Walk 10 feet on uneven surfaces activity did not occur: Safety/medical concerns         Wheelchair     Assist   Type of Wheelchair: Manual    Wheelchair assist level: Minimal Assistance - Patient > 75% Max wheelchair distance: 120    Wheelchair 50 feet with 2 turns activity    Assist        Assist Level:  Minimal Assistance - Patient > 75%   Wheelchair 150 feet activity     Assist Wheelchair 150 feet activity did not occur: Safety/medical concerns        Medical Problem List and Plan: 1.Left side weaknesssecondary to right MCA infarction with right M1 occlusion status post minimal recannulization after mechanical thrombectomy as well as history of left MCA infarction April 2019. Advise 30 day cardiac monitor on discharge  Continue CIR  PRAFO/WHO nightly  SNF remains pending 2. DVT Prophylaxis/Anticoagulation: Subcutaneous Lovenox. 3. Pain Management:Tylenol as needed 4. Mood:Aricept 5 mg daily 5. Neuropsych: This patientiscapable of making decisions on herown behalf. 6. Skin/Wound Care:Routine skin checks 7. Fluids/Electrolytes/Nutrition:Routine ins and outs  IVF [place] on 11/27  D2 thins, advance diet as tolerated  Intake reasonable at present 8. Cardiomyopathy/chronic combined diastolic congestive heart failure. Monitor for any signs of fluid overload. 9. Hypertension. Coreg 12.5 mg twice a day. Monitor with increased mobility Vitals:   03/13/18 1955 03/14/18 0508  BP: 125/74 140/70  Pulse: 73 73  Resp: 18 16  Temp: 98 F (36.7 C) 98.9 F (37.2 C)  SpO2: 98% 100%   Controlled on 12/17 10. COPD/tobacco abuse. Continue inhalers. Provide counseling 11. Hyperlipidemia. Lipitor 12. Acute on chronic anemia.   Hemoglobin 8.8 on 12/13  Continue to monitor 13.  Hx gout left great toe  14.  CKD ?Stage II  Creatinine 1.26 on 12/13  Encouraging fluids  Continue to monitor 15.  Hypoalbuminemia  Supplement initiated on 11/25 16.  Hypokalemia  Potassium 4.0 on 12/13  KCL 56meq qd 12/6   LOS: 26 days A FACE TO FACE EVALUATION WAS PERFORMED  Caidynce Muzyka [name] 03/14/2018, 8:09 AM

## 2018-03-14 NOTE — Progress Notes (Signed)
Speech Language Pathology Daily Session Note  Patient Details  Name: Brandy [name] MRN: [id number] Date of Birth: May 15, 1940  Today's Date: 03/14/2018 SLP Individual Time: 4784-1282 SLP Individual Time Calculation (min): 54 min  Short Term Goals: Week 4: SLP Short Term Goal 1 (Week 4): STG=LTG due to remaining length of stay, pending SNF placement   Skilled Therapeutic Interventions:Skilled ST services focused on swallow and cognitive skills. SLP facilitated basic problem solving and sustained attention utilizing find three differences among two cards task and card task at basic level, blink, pt required mod-min A verbal cues for problem solving and required mod-min A verbal cues for sustained attention in 15 minute intervals.  Pt demonstrated recall of card game rules, three noted differences, required min A verbal cues. SLP facilitated PO consumption of thin via straw with no overt s/s aspiration. Pt was left in room with call [name] within reach and chair alarm set. SLP reccomends to continue skilled services.     Pain Pain Assessment Pain Score: 0-No pain  Therapy/Group: Individual Therapy  Brandy [name]  [facility] 03/14/2018, 2:02 PM

## 2018-03-14 NOTE — Progress Notes (Signed)
Physical Therapy Session Note  Patient Details  Name: Brandy [name] MRN: [id number] Date of Birth: June 04, 1940  Today's Date: 03/14/2018 PT Individual Time: 1100-1200 PT Individual Time Calculation (min): 60 min   Short Term Goals: Week 4:  PT Short Term Goal 1 (Week 4): STG=LTG due to ELOS  Skilled Therapeutic Interventions/Progress Updates: Pt presented in TIS agreeable to therapy. Pt transported to ortho gym and performed SB transfer maxA to mat. Participated in dynamic sitting balance ball toss with pt able to maintain balance initially supervision then requiring min/modA with fatigue as required multimodal cues for returning to midline from posterior lean. Pt returned to TIS SB transfer maxA and transported to day room where pt participated in standing frame x 8 min with mirror feedback and pt requiring max multimodal cues for erect posture. Pt was able to perform R quad activation x 3 to achieve full extension. Pt also participated in reaching with PTA supporting LUE and tactile cues for truncal support. Pt returned to [place] and returned to room. Pt left with belt alarm on, call Nov[place]ki within reach and needs met.      Therapy Documentation Precautions:  Precautions Precautions: Fall Precaution Comments: mild Lt inattention, left hemiplegia, pusher to the left, motor impersistence Restrictions Weight Bearing Restrictions: No General:   Vital Signs:   Pain: Pain Assessment Pain Score: 0-No pain   Therapy/Group: Individual Therapy  Poetry Cerro  Marsi Turvey, PTA  03/14/2018, 2:06 PM

## 2018-03-14 NOTE — Progress Notes (Signed)
Occupational Therapy Session Note  Patient Details  Name: Brandy [name] MRN: [id number] Date of Birth: [birth year]  Today's Date: 03/14/2018 OT Individual Time: 0850-1000 OT Individual Time Calculation (min): 70 min    Short Term Goals: Week 3:  OT Short Term Goal 1 (Week 3): Pt will complete sliding board transfer from bed to wheelchair with max assist. OT Short Term Goal 2 (Week 3): Pt will maintain dynamic sitting balance during UB bathing with mod assist.  OT Short Term Goal 3 (Week 3): Pt will perform toilet transfer with total assist +2 (pt 25%) using the Stedy.  OT Short Term Goal 4 (Week 3): Pt will complete sit to stand with total assist +2 (pt 35%) during LB selfcare tasks.   Skilled Therapeutic Interventions/Progress Updates:    Session focused on UB b/d, functional transfers, and midline orientation. Pt received at bed level, no c/o pain with brother present. Pt doffed shirt with max A supine in bed. Pt able to use bed rail to pull herself into long sitting with mod A. Pt washed UB with min A. Pullover shirt was donned with max A, with pt correctly verbalizing hemi dressing technique. [name] L paretic hand was thoroughly cleaned and retrograde massage provided to reduce edema. Pt transitioned to EOB with max A. Heavy cueing for pt to maintain upright posture and midline orientation. Pt completed lateral scoot to TIS w/c with slideboard, requiring max A, +2 for equipment/safety. Pt was transported down to therapy gym where she participated in functional reaching with weight shift toward the R. Moderate cueing for pt to engage in task. Pt returned to room and left sitting up in chair with chair alarm belt fastened and all needs met.   Therapy Documentation Precautions:  Precautions Precautions: Fall Precaution Comments: mild Lt inattention, left hemiplegia, pusher to the left, motor impersistence Restrictions Weight Bearing Restrictions: No Vital Signs: Therapy Vitals Temp: 98.9  F (37.2 C) Temp Source: Oral Pulse Rate: 73 Resp: 16 BP: 140/70 Patient Position (if appropriate): Lying Oxygen Therapy SpO2: 100 % Pain:  No pain reported   Therapy/Group: Individual Therapy  Curtis Sites 03/14/2018, 7:21 AM

## 2018-03-15 ENCOUNTER — Inpatient Hospital Stay (HOSPITAL_COMMUNITY): Payer: Medicare Other | Admitting: Occupational Therapy

## 2018-03-15 ENCOUNTER — Inpatient Hospital Stay (HOSPITAL_COMMUNITY): Payer: Medicare Other | Admitting: Speech Pathology

## 2018-03-15 ENCOUNTER — Inpatient Hospital Stay (HOSPITAL_COMMUNITY): Payer: Medicare Other | Admitting: Physical Therapy

## 2018-03-15 NOTE — Progress Notes (Signed)
Physical Therapy Discharge Summary  Patient Details  Name: Brandy [name] MRN: [id number] Date of Birth: [birth year]  Today's Date: 03/16/2018    Patient has met 5 of 6 long term goals due to improved activity tolerance, improved balance, improved postural control, increased strength, ability to compensate for deficits, improved attention and improved awareness.  Patient to discharge at a wheelchair level Max Assist.   Patient's care partner unavailable to provide the necessary physical and cognitive assistance at discharge.  Reasons goals not met: Pt continues to required total assist for all standing activities due to impaired midline orientation and severe L sided inattention, limiting use of the L UE or LLE    Recommendation:  Patient will benefit from ongoing skilled PT services in skilled nursing facility setting to continue to advance safe functional mobility, address ongoing impairments in balance, transfers, WC mobility, midline orientation, and minimize fall risk.  Equipment: No equipment provided  Reasons for discharge: treatment goals met and discharge from hospital  Patient/family agrees with progress made and goals achieved: Yes  PT Discharge Precautions/Restrictions Fall. Flaccid LUE and LLE.    Pain Pain Assessment Pain Scale: 0-10 Pain Score: 0-No pain Vision/Perception    severe L inattention. Improved from eval.  Cognition Overall Cognitive Status: Impaired/Different from baseline Arousal/Alertness: Awake/alert Orientation Level: Oriented X4 Attention: Sustained Sustained Attention: Impaired Sustained Attention Impairment: Functional basic;Functional complex Memory: Impaired Memory Impairment: Decreased recall of new information;Retrieval deficit;Storage deficit Awareness: Impaired Awareness Impairment: Intellectual impairment Problem Solving: Impaired Problem Solving Impairment: Verbal basic;Functional basic Executive Function:  Organizing;Initiating;Self Monitoring;Self Correcting Organizing: Impaired Organizing Impairment: Verbal basic;Functional basic Initiating: Impaired Initiating Impairment: Verbal basic;Functional basic Self Monitoring: Impaired Self Monitoring Impairment: Verbal basic;Functional basic Self Correcting: Impaired Self Correcting Impairment: Verbal basic;Functional basic Safety/Judgment: Impaired Comments: left neglect Sensation Sensation Light Touch: Impaired Detail Light Touch Impaired Details: Impaired LUE Hot/Cold: Impaired Detail Hot/Cold Impaired Details: Impaired LUE Proprioception: Impaired Detail Proprioception Impaired Details: Absent LUE Stereognosis: Not tested Additional Comments: Pt able to detect some pressure to the upper arm but not able to detect pressure or light touch in the hand or digits no light touch appreicaition in distal LLE . Coordination Gross Motor Movements are Fluid and Coordinated: No Fine Motor Movements are Fluid and Coordinated: No Motor  Motor Motor: Hemiplegia Motor - Discharge Observations: Pt still with severe left UE and LE hemiplegia  Mobility Bed Mobility Bed Mobility: Rolling Right;Rolling Left;Sit to Supine;Supine to Sit Rolling Right: Moderate Assistance - Patient 50-74% Rolling Left: Moderate Assistance - Patient 50-74% Sit to Supine: Moderate Assistance - Patient 50-74% Transfers Transfer (Assistive device): Other (Comment)(Stedy. ) Locomotion  Gait Ambulation: No Gait Gait: No Stairs / Additional Locomotion Stairs: No Wheelchair Mobility Wheelchair Mobility: Yes Wheelchair Assistance: Minimal assistance - Patient >75% Wheelchair Propulsion: Right lower extremity;Right upper extremity Wheelchair Parts Management: Needs assistance Distance: 123f  Trunk/Postural Assessment  Cervical Assessment Cervical Assessment: (P) Exceptions to WFL(cervical flexion and rotation to the right noted at rest) Thoracic Assessment Thoracic  Assessment: (P) Exceptions to WFL(thoracic kyphosis as well as left side trunk elongation ) Lumbar Assessment Lumbar Assessment: (P) Exceptions to WFL(posterior pelvic tilt at rest) Postural Control Postural Control: (P) Deficits on evaluation Protective Responses: (P) Delayed with LOB to the left noted when sitting unsupported.   Balance Static Sitting Balance Static Sitting - Balance Support: Right upper extremity supported Static Sitting - Level of Assistance: 5: Stand by assistance Dynamic Sitting Balance Dynamic Sitting - Balance Support: Right upper extremity supported Dynamic Sitting -  Level of Assistance: 4: Min Insurance risk surveyor Standing - Level of Assistance: 2: Max assist Dynamic Standing Balance Dynamic Standing - Level of Assistance: 1: +1 Total assist Extremity Assessment      RLE Assessment RLE Assessment: Exceptions to [facility] Active Range of Motion (AROM) Comments: mild limitation in ankle DF and knee flexion due to joint pain  General Strength Comments: grossly 4/5  LLE Assessment LLE Assessment: Exceptions to [facility] General Strength Comments: intermittent movement in flexor symmetry with gravity eliminated     [name] 03/15/2018, 2:41 PM

## 2018-03-15 NOTE — Progress Notes (Signed)
Speech Language Pathology Daily Session Note  Patient Details  Name: KEAGHAN BOWENS MRN: [id number] Date of Birth: 01-12-1941  Today's Date: 03/15/2018 SLP Individual Time: 4403-4742 SLP Individual Time Calculation (min): 64 min  Short Term Goals: Week 4: SLP Short Term Goal 1 (Week 4): STG=LTG due to remaining length of stay, pending SNF placement   Skilled Therapeutic Interventions:  Pt was seen for skilled ST targeting goals for cognition and dysphagia.  SLP facilitated the session with a novel, basic deductive reasoning puzzle to address problem solving goals.  Pt needed max cues to complete puzzle due to brief periods of ability to sustain attention to task impacting all higher level cognitive processes.  SLP also facilitated the session with various table top visual scanning tasks which pt was able to complete with min assist for organized scanning and error awareness.  Pt was returned to room upon arrival of lunch tray.  She consumed regular textures and thin liquids with min cues to correct pockeing and left sided spillage of boluses.  No overt s/s of aspiration were evident with solids or liquids via straw.  Pt was left in chair with husband at bedside.  Continue per current plan of care.    Pain Pain Assessment Pain Scale: 0-10 Pain Score: 0-No pain  Therapy/Group: Individual Therapy  Linley Moskal, Selinda Orion 03/15/2018, 12:53 PM

## 2018-03-15 NOTE — Progress Notes (Signed)
Occupational Therapy Session Note  Patient Details  Name: Brandy [name] MRN: [id number] Date of Birth: [birth year]  Today's Date: 03/15/2018 OT Individual Time: 3704-8889 OT Individual Time Calculation (min): 56 min    Short Term Goals: Week 3:  OT Short Term Goal 1 (Week 3): Pt will complete sliding board transfer from bed to wheelchair with max assist. OT Short Term Goal 2 (Week 3): Pt will maintain dynamic sitting balance during UB bathing with mod assist.  OT Short Term Goal 3 (Week 3): Pt will perform toilet transfer with total assist +2 (pt 25%) using the Stedy.  OT Short Term Goal 4 (Week 3): Pt will complete sit to stand with total assist +2 (pt 35%) during LB selfcare tasks.   Skilled Therapeutic Interventions/Progress Updates:    Pt completed bathing and dressing sit to stand from the EOB.  Max assist for rolling to the right side following taught technique for activation of abdominals.  Max assist for sidelying to sit.  Once up, she was able to maintain static sitting with close supervision to min guard assist.  When working on UB bathing she continues to need max assist.  Max assist for all bathing in sitting only as peri area was completed by nursing tech earlier.  Wash pan was positioned on the bedside table to the right for encouragement of lateral weightshift to the pushing side.  Total assist +2 (pt 20%) for sit to stand with LB dressing.  Utilized sliding board transfer to the wheelchair to finish session.  Pt tilted back in chair 15 degrees for support with the LUE supported on the half lap tray.  Spouse present as well.    Therapy Documentation Precautions:  Precautions Precautions: Fall Precaution Comments: mild Lt inattention, left hemiplegia, pusher to the left, motor impersistence Restrictions Weight Bearing Restrictions: No   Pain:  No report or sign of pain during session ADL: See Care Tool Section for some ADL tasks.  Therapy/Group: Individual  Therapy  Luzelena Heeg OTR/L 03/15/2018, 5:09 PM

## 2018-03-15 NOTE — Discharge Summary (Signed)
Discharge summary job # [id number]

## 2018-03-15 NOTE — Progress Notes (Signed)
[place] PHYSICAL MEDICINE & REHABILITATION PROGRESS NOTE   Subjective/Complaints: Patient seen lying in bed this morning.  States he slept well overnight.  She still sleepy.  ROS: Denies CP, SOB, N/V/D  Objective:   No results found. No results for input(s): WBC, HGB, HCT, PLT in the last 72 hours. No results for input(s): NA, K, CL, CO2, GLUCOSE, BUN, CREATININE, CALCIUM in the last 72 hours.  Intake/Output Summary (Last 24 hours) at 03/15/2018 0825 Last data filed at 03/14/2018 1700 Gross per 24 hour  Intake 360 ml  Output -  Net 360 ml     Physical Exam: Vital Signs Blood pressure 140/71, pulse 85, temperature 98.1 F (36.7 C), temperature source Oral, resp. rate 15, height 5\' 4"  (1.626 m), weight 85.7 kg, SpO2 95 %.  Constitutional: No distress . Vital signs reviewed. HENT: Normocephalic.  Atraumatic. Eyes: EOMI. No discharge. Cardiovascular: RRR.  No JVD. Respiratory: CTA bilaterally.  Normal effort. GI: BS +. Non-distended. Musc: Edema left hand and foot. Neurological: Alert. Dysarthria Left inattention improving Sensation absent to light touch left upper and lower extremity.  Motor: LUE/LLE: 0/5 proximal to distal, unchanged Right lower extremity: 2/5 hip flexion, knee extension, 4+/5 ankle dorsiflexion, unchanged No increase in tone Skin: Skin iswarmand dry. Psych: cooperative  Assessment/Plan: 1. Functional deficits secondary to right MCA infarction with right M1 occlusion status post minimal recannulization after mechanical thrombectomy as well as history of left MCA infarction April 2019. which require 3+ hours per day of interdisciplinary therapy in a comprehensive inpatient rehab setting.  Physiatrist is providing close team supervision and 24 hour management of active medical problems listed below.  Physiatrist and rehab team continue to assess barriers to discharge/monitor patient progress toward functional and medical goals  Care  Tool:  Bathing    Body parts bathed by patient: Left arm, Chest, Abdomen   Body parts bathed by helper: Right arm     Bathing assist Assist Level: Minimal Assistance - Patient > 75%     Upper Body Dressing/Undressing Upper body dressing   What is the patient wearing?: Pull over shirt    Upper body assist Assist Level: Maximal Assistance - Patient 25 - 49%    Lower Body Dressing/Undressing Lower body dressing      What is the patient wearing?: Pants, Incontinence brief     Lower body assist Assist for lower body dressing: Total Assistance - Patient < 25%     Toileting Toileting    Toileting assist Assist for toileting: Total Assistance - Patient < 25%     Transfers Chair/bed transfer  Transfers assist     Chair/bed transfer assist level: Maximal Assistance - Patient 25 - 49%     Locomotion Ambulation   Ambulation assist   Ambulation activity did not occur: Safety/medical concerns  Assist level: Total Assistance - Patient < 25% Assistive device: Lite Gait Max distance: 35   Walk 10 feet activity   Assist  Walk 10 feet activity did not occur: Safety/medical concerns  Assist level: Total Assistance - Patient < 25% Assistive device: Lite Gait   Walk 50 feet activity   Assist Walk 50 feet with 2 turns activity did not occur: Safety/medical concerns         Walk 150 feet activity   Assist Walk 150 feet activity did not occur: Safety/medical concerns         Walk 10 feet on uneven surface  activity   Assist Walk 10 feet on uneven surfaces activity did  not occur: Safety/medical concerns         Wheelchair     Assist   Type of Wheelchair: Manual    Wheelchair assist level: Minimal Assistance - Patient > 75% Max wheelchair distance: 120    Wheelchair 50 feet with 2 turns activity    Assist        Assist Level: Minimal Assistance - Patient > 75%   Wheelchair 150 feet activity     Assist Wheelchair 150 feet  activity did not occur: Safety/medical concerns        Medical Problem List and Plan: 1.Left side weaknesssecondary to right MCA infarction with right M1 occlusion status post minimal recannulization after mechanical thrombectomy as well as history of left MCA infarction April 2019. Advise 30 day cardiac monitor on discharge  Continue CIR  PRAFO/WHO nightly  SNF remains pending 2. DVT Prophylaxis/Anticoagulation: Subcutaneous Lovenox. 3. Pain Management:Tylenol as needed 4. Mood:Aricept 5 mg daily 5. Neuropsych: This patientiscapable of making decisions on herown behalf. 6. Skin/Wound Care:Routine skin checks 7. Fluids/Electrolytes/Nutrition:Routine ins and outs  IVF [place] on 11/27  D2 thins, advance diet as tolerated 8. Cardiomyopathy/chronic combined diastolic congestive heart failure. Monitor for any signs of fluid overload. 9. Hypertension. Coreg 12.5 mg twice a day. Monitor with increased mobility Vitals:   03/14/18 1933 03/15/18 0634  BP: 117/67 140/71  Pulse: 71 85  Resp: 16 15  Temp: 98.6 F (37 C) 98.1 F (36.7 C)  SpO2: 99% 95%   Controlled on 12/18 10. COPD/tobacco abuse. Continue inhalers. Provide counseling 11. Hyperlipidemia. Lipitor 12. Acute on chronic anemia.   Hemoglobin 8.8 on 12/13  Labs ordered for tomorrow  Continue to monitor 13.  Hx gout left great toe  14.  CKD ?Stage II  Creatinine 1.26 on 12/13  Labs ordered for tomorrow  Encouraging fluids  Continue to monitor 15.  Hypoalbuminemia  Supplement initiated on 11/25 16.  Hypokalemia  Potassium 4.0 on 12/13  Labs ordered for tomorrow  KCL 37meq qd 12/6   LOS: 27 days A FACE TO FACE EVALUATION WAS PERFORMED  Analyssa Downs [name] 03/15/2018, 8:25 AM

## 2018-03-15 NOTE — Progress Notes (Signed)
Physical Therapy Session Note  Patient Details  Name: Brandy [name] MRN: [id number] Date of Birth: 16-Apr-1940  Today's Date: 03/15/2018 PT Individual Time: 1300-1400 PT Individual Time Calculation (min): 60 min   Short Term Goals: Week 4:  PT Short Term Goal 1 (Week 4): STG=LTG due to ELOS  Skilled Therapeutic Interventions/Progress Updates:    Pt received sitting in WC and agreeable to PT. PT PT treatment focused on improved independence with WC mobility and transfers to various surfaces including mat table, TIS WC, and Hemi height WC. Pt required mod assist for all transfers and increased time as listed belwo for transfers with SB and step under feet to higher surfaces such as TIS WC. Min assist as listed below for WC mobility. Max assist for sit<>stand x 3 in stedy with visual feed back from mirror. Patient returned to room and left sitting in [facility] with call [place] in reach and all needs met.      Therapy Documentation Precautions:  Precautions Precautions: Fall Precaution Comments: mild Lt inattention, left hemiplegia, pusher to the left, motor impersistence Restrictions Weight Bearing Restrictions: No Pain: Pain Assessment Pain Scale: 0-10 Pain Score: 0-No pain Mobility: Bed Mobility Bed Mobility: Rolling Right;Rolling Left;Sit to Supine;Supine to Sit Rolling Right: Moderate Assistance - Patient 50-74% Rolling Left: Moderate Assistance - Patient 50-74% Sit to Supine: Moderate Assistance - Patient 50-74% Transfers Transfer (Assistive device): Other (Comment)(Stedy. ) Locomotion : Gait Ambulation: No Gait Gait: No Stairs / Additional Locomotion Stairs: No Wheelchair Mobility Wheelchair Mobility: Yes Wheelchair Assistance: Minimal assistance - Patient >75% Wheelchair Propulsion: Right lower extremity;Right upper extremity Wheelchair Parts Management: Needs assistance Distance: 175f  Trunk/Postural Assessment : Cervical Assessment Cervical Assessment: (P) Exceptions  to WFL(cervical flexion and rotation to the right noted at rest) Thoracic Assessment Thoracic Assessment: (P) Exceptions to WFL(thoracic kyphosis as well as left side trunk elongation ) Lumbar Assessment Lumbar Assessment: (P) Exceptions to WFL(posterior pelvic tilt at rest) Postural Control Postural Control: (P) Deficits on evaluation Protective Responses: (P) Delayed with LOB to the left noted when sitting unsupported.   Balance: Static Sitting Balance Static Sitting - Balance Support: Right upper extremity supported Static Sitting - Level of Assistance: 5: Stand by assistance Dynamic Sitting Balance Dynamic Sitting - Balance Support: Right upper extremity supported Dynamic Sitting - Level of Assistance: 4: Min assist Static Standing Balance Static Standing - Level of Assistance: 2: Max assist Dynamic Standing Balance Dynamic Standing - Level of Assistance: 1: +1 Total assist Exercises:   Other Treatments:      Therapy/Group: Individual Therapy  ALorie Phenix12/18/2019, 2:36 PM

## 2018-03-15 NOTE — Discharge Summary (Addendum)
[name], [name] MEDICAL RECORD OX:[id number] ACCOUNT [id number] DATE OF BIRTH:[birth year] FACILITY: [facility] LOCATION: [facility] PHYSICIAN:Obi Scrima, MD  DISCHARGE SUMMARY  DATE OF DISCHARGE:  03/16/2018  DISCHARGE DIAGNOSES: 1.  Right middle cerebral artery infarction with right M1 occlusion, status post mechanical thrombectomy. Subcutaneous Lovenox for deep venous thrombosis prophylaxis. 2.  Pain management. 3.  Mood. 4.  Cardiomyopathy with chronic combined diastolic congestive heart failure.   5.  Chronic obstructive pulmonary disease with tobacco abuse. 6.  Hyperlipidemia.   7.  Acute on chronic anemia.   8.  History of gout.  HOSPITAL COURSE:  This is a 77 year old right-handed female multi-medical cardiomyopathy, diastolic, systolic congestive heart failure, hypertension, COPD as well as CVA, 06/2017.  Maintained on aspirin.  Reportedly, independent prior to admission and  still driving prepares her own meals.  Presented 02/10/2018 to [facility] with left-sided weakness, right gaze preference.  Cranial CT scan showed mixed pattern of both acute suspected subacute right hemisphere ischemia in the setting of  hyperdense right M1 MCA and acute weakness left side consistent with emergent large vessel occlusion on the right proximal MCA.  CT angiogram head and neck, acute right M1 MCA occlusion.  She was transferred to [facility] and underwent  mechanical thrombectomy per interventional radiology.  Followup MRI showed large area of infarction, right MCA territory.  Echocardiogram with ejection fraction of 40%.  TEE with ejection fraction 40%.  Could not rule out early forming thrombus in the  L-AA versus artifact from the posterior wall of the appendage.  The patient with multiple runs of nonsustained atrial tachycardia.  Maintained on aspirin and Plavix for CVA prophylaxis, subcutaneous Lovenox for DVT prophylaxis.  Initially on a dysphagia  #1 diet.  The patient was  admitted for comprehensive rehabilitation program.  PAST MEDICAL HISTORY:  See discharge diagnoses.  SOCIAL HISTORY:  Lives with spouse, reportedly independent prior to admission.  FUNCTIONAL STATUS:  Upon admission to rehab services was max assist, stand pivot transfers, max assist supine to sit, max total assist ADLs.  PHYSICAL EXAMINATION: VITAL SIGNS:  Blood pressure 142/68, pulse 67, temperature 98, respirations 20. GENERAL:  Alert female, dysarthric speech, inconsistent to follow commands. HEENT:  EOMs intact. NECK:  Supple, nontender, no JVD. CARDIOVASCULAR:  Rate controlled. ABDOMEN:  Soft, nontender, good bowel sounds. LUNGS:  Clear to auscultation without wheeze.  Dense left-sided weakness.  REHABILITATION HOSPITAL COURSE:  The patient was admitted to inpatient rehabilitation services.  Therapies initiated on a 3-hour daily basis, consisting of physical therapy, occupational therapy, speech therapy and rehabilitation nursing.  The following  issues were addressed during patient's rehabilitation stay.  Pertaining to the patient's right MCA infarction, she had undergone mechanical thrombectomy for M1 occlusion.  She remained on aspirin and Plavix therapy.  Plan was for a 30-day cardiac  monitor.  She would follow up neurology services.  Subcutaneous Lovenox for DVT prophylaxis.    Mood stabilization, she remained on Aricept as well as the addition of Ritalin to help focus more to tasks.    She exhibited no other signs of fluid overload.    Noted history of cardiomyopathy.  Blood pressure is controlled with Coreg.    Documented history of COPD, tobacco abuse.  Inhalers as advised.    Acute on chronic anemia 8.8 hemoglobin.  No bleeding episodes.    Question chronic kidney disease stage II.  Creatinine 1.26 and remained stable.  Her diet had since been advanced to a regular consistency with thin liquids.  The patient received weekly collaborative interdisciplinary team  conferences to discuss estimated length of stay, family teaching, any barriers to her discharge.  She  participates with dynamic sitting balance, needing multiple cues min mod assist.  Sliding board transfers, max assist.  Working with energy conservation techniques, upper body ADLs, max assist supine in bed.  Due to the ongoing need for physical  assistance, it was felt skilled nursing facility was needed with bed becoming available 03/16/2018.  DISCHARGE MEDICATIONS:  Included aspirin 325 mg p.o. daily, Lipitor 10 mg p.o. daily. Coreg 12.5 mg p.o. b.i.d., Plavix 75 mg p.o. daily, Aricept 5 mg p.o. at bedtime, Ritalin 5 mg p.o. b.i.d., Ellipta 62.5/25 mcg 1 puff daily, Tylenol as needed.  DIET:  Regular consistency, thin liquids.  The patient would follow up with Dr. Delice Lesch at the outpatient rehab service office as advised; Dr. Erlinda Hong of [facility] Neurology services; Dr.  Bronson Ing, cardiology services.  AN/NUANCE D:03/15/2018 T:03/15/2018 JOB:[id number]  Patient seen and examined by me on day of discharge. Delice Lesch, MD, ABPMR

## 2018-03-16 ENCOUNTER — Inpatient Hospital Stay (HOSPITAL_COMMUNITY): Payer: Medicare Other | Admitting: Speech Pathology

## 2018-03-16 ENCOUNTER — Inpatient Hospital Stay (HOSPITAL_COMMUNITY): Payer: Medicare Other | Admitting: Physical Therapy

## 2018-03-16 ENCOUNTER — Inpatient Hospital Stay (HOSPITAL_COMMUNITY): Payer: Medicare Other | Admitting: *Deleted

## 2018-03-16 LAB — BASIC METABOLIC PANEL WITH GFR
Anion gap: 7 (ref 5–15)
BUN: 30 mg/dL — ABNORMAL HIGH (ref 8–23)
CO2: 22 mmol/L (ref 22–32)
Calcium: 8.8 mg/dL — ABNORMAL LOW (ref 8.9–10.3)
Chloride: 114 mmol/L — ABNORMAL HIGH (ref 98–111)
Creatinine, Ser: 1.21 mg/dL — ABNORMAL HIGH (ref 0.44–1.00)
GFR calc Af Amer: 51 mL/min — ABNORMAL LOW
GFR calc non Af Amer: 44 mL/min — ABNORMAL LOW
Glucose, Bld: 106 mg/dL — ABNORMAL HIGH (ref 70–99)
Potassium: 4.2 mmol/L (ref 3.5–5.1)
Sodium: 143 mmol/L (ref 135–145)

## 2018-03-16 LAB — CBC WITH DIFFERENTIAL/PLATELET
Abs Immature Granulocytes: 0.02 10*3/uL (ref 0.00–0.07)
BASOS ABS: 0 10*3/uL (ref 0.0–0.1)
BASOS PCT: 1 %
EOS ABS: 0.2 10*3/uL (ref 0.0–0.5)
EOS PCT: 4 %
HCT: 29.2 % — ABNORMAL LOW (ref 36.0–46.0)
Hemoglobin: 8.9 g/dL — ABNORMAL LOW (ref 12.0–15.0)
Immature Granulocytes: 0 %
Lymphocytes Relative: 26 %
Lymphs Abs: 1.5 10*3/uL (ref 0.7–4.0)
MCH: 27.9 pg (ref 26.0–34.0)
MCHC: 30.5 g/dL (ref 30.0–36.0)
MCV: 91.5 fL (ref 80.0–100.0)
MONO ABS: 0.5 10*3/uL (ref 0.1–1.0)
Monocytes Relative: 10 %
NRBC: 0 % (ref 0.0–0.2)
Neutro Abs: 3.2 10*3/uL (ref 1.7–7.7)
Neutrophils Relative %: 59 %
PLATELETS: 252 10*3/uL (ref 150–400)
RBC: 3.19 MIL/uL — AB (ref 3.87–5.11)
RDW: 16.3 % — AB (ref 11.5–15.5)
WBC: 5.5 10*3/uL (ref 4.0–10.5)

## 2018-03-16 MED ORDER — ACETAMINOPHEN 325 MG PO TABS: 650.0000 mg | ORAL_TABLET | ORAL | Status: AC | PRN

## 2018-03-16 MED ORDER — METHYLPHENIDATE HCL 5 MG PO TABS: 5.0000 mg | ORAL_TABLET | Freq: Two times a day (BID) | ORAL | 0 refills | Status: DC

## 2018-03-16 MED ORDER — UMECLIDINIUM-VILANTEROL 62.5-25 MCG/INH IN AEPB: 1.0000 | INHALATION_SPRAY | Freq: Every day | RESPIRATORY_TRACT | 5 refills | Status: DC

## 2018-03-16 NOTE — Progress Notes (Signed)
Social Work Discharge Note  The overall goal for the admission was met for:   Discharge location: No - tx to [facility]  Length of Stay: Yes - 28 days  Discharge activity level: No - maximum assistance  Home/community participation: No  Services provided included: MD, RD, PT, OT, SLP, RN, Pharmacy, Neuropsych and SW  Financial Services: Private Insurance: [employer]  Follow-up services arranged: Other: SNF  Comments (or additional information): Pt tx to SNF via [facility].  Family aware.  Patient/Family verbalized understanding of follow-up arrangements: Yes  Individual responsible for coordination of the follow-up plan:  [name] husband with support from his dtr and pt's brother.  Confirmed correct DME delivered: Trey Sailors 03/16/2018    Tyren Dugar, Silvestre Mesi

## 2018-03-16 NOTE — Progress Notes (Signed)
Patient discharged to [facility] at approximatley 1315. Patient alert and oriented . Pain in left shoulder treated with Tylenol 650 mg po . Discharge packet given to EMS. Husband took all  Patient belongings home.  Brandy [name]

## 2018-03-16 NOTE — Progress Notes (Signed)
Speech Language Pathology Discharge Summary  Patient Details  Name: Brandy [name] MRN: [id number] Date of Birth: 03-29-1941  Today's Date: 03/16/2018 SLP Individual Time: 0850-0950 SLP Individual Time Calculation (min): 60 min   Skilled Therapeutic Interventions:  Pt was seen for skilled ST targeting cognitive goals.  Upon arrival, pt was in bed awake, alert, and agreeable to getting out of bed for therapies.  Pt had been incontinent of bladder and bowel with seemingly little awareness of soiled brief and linens.  Once clean brief and pants were donned, pt was transferred to wheelchair via [place].  SLP facilitated the session with a pipe tree task to address goals for visual scanning, problem solving, and attention.  Overall pt needed max assist to complete the abovementioned task as she would lose focus after periods of ~2-3 minutes and had difficulty organizing and recognizing errors within task.  Pt was returned to room and left in wheelchair with chair alarm set and husband at bedside.      Patient has met 4 of 8 long term goals.  Patient to discharge at overall Min;Mod level.  Reasons goals not met:     Clinical Impression/Discharge Summary:   Pt has made slow, functional gains while inpatient and is discharging having met 4 out of 8 short term goals.  Pt is currently mod assist for tasks due to moderately severe cognitive deficits and is consuming a regular textures, thin liquids diet with min assist for use of swallowing precautions.  Pt and family education is complete for this level of care.  Pt has demonstrated improved mastication of solids and ability to scan to the left of midline.  Pt is discharging to SNF with recommendations for ST follow up at next level of care.     Care Partner:  Caregiver Able to Provide Assistance: Other (comment)(SNF)  Type of Caregiver Assistance: (SNF)  Recommendation:  Skilled Nursing facility  Rationale for SLP Follow Up: Reduce caregiver  burden;Maximize cognitive function and independence;Maximize functional communication   Equipment: none recommended by SLP    Reasons for discharge: Discharged from hospital   Patient/Family Agrees with Progress Made and Goals Achieved: Yes    [name], Selinda Orion 03/16/2018, 12:50 PM

## 2018-03-16 NOTE — Progress Notes (Signed)
Occupational Therapy Discharge Summary  Patient Details  Name: Brandy [name] MRN: [id number] Date of Birth: 05/31/1940   Patient has met 8 of 15  long term goals due to improved activity tolerance, improved balance, postural control, ability to compensate for deficits, functional use of  LEFT upper and LEFT lower extremity, improved attention and improved awareness.  Patient to discharge at overall Max Assist level.  Patient's care partner requires assistance to provide the necessary physical and cognitive assistance at discharge.    Reasons goals not met: Pt continues to need total +2 for sit to stand, standing balance, LB selfcare,l as well as toileting tasks.  She needs greater assist for UB dressing than goals specified, and continues to need max assist for dynamic sitting balance at this time.     Recommendation:  Patient will benefit from ongoing skilled OT services in skilled nursing facility setting to continue to advance functional skills in the area of BADL and Reduce care partner burden.  Pt still exhibits severe motor deficits as well as apraxia and impersistence.  She continues to exhibit left neglect as well as decreased initiation, problem solving, and carryover from day to day.  Feel she will need extensive SNF level for rehab for an extended period to reach mod assist level goals or better so she can return home with her spouse, based on her slower than anticipated progress as well as the severity of her deficits.  She has the motivation to get better and feel she will do great at the SNF level.      Equipment: No equipment provided  Reasons for discharge: treatment goals met and discharge from hospital  Patient/family agrees with progress made and goals achieved: Yes  OT Discharge Precautions/Restrictions  Precautions Precautions: Fall Precaution Comments: mild Lt inattention, left hemiplegia, pusher to the left, motor impersistence Restrictions Weight Bearing  Restrictions: No   Pain Pain Assessment Pain Scale: 0-10 Pain Score: 0-No pain ADL ADL Eating: Supervision/safety Where Assessed-Eating: Wheelchair Grooming: Minimal assistance Where Assessed-Grooming: Wheelchair Upper Body Bathing: Minimal assistance Where Assessed-Upper Body Bathing: Wheelchair Lower Body Bathing: Dependent Where Assessed-Lower Body Bathing: Bed level, Edge of bed Upper Body Dressing: Maximal assistance Where Assessed-Upper Body Dressing: Wheelchair Lower Body Dressing: Dependent Where Assessed-Lower Body Dressing: Edge of bed, Bed level Toileting: Dependent Where Assessed-Toileting: Other (Comment)(drop arm commode) Toilet Transfer: Dependent Toilet Transfer Method: Transfer board Toilet Transfer Equipment: Drop arm bedside commode Tub/Shower Transfer: Not assessed Walk-In Shower Transfer: Not assessed Vision Baseline Vision/History: No visual deficits Patient Visual Report: No change from baseline Vision Assessment?: Yes Eye Alignment: Within Functional Limits Ocular Range of Motion: Within Functional Limits Alignment/Gaze Preference: Gaze right;Head turned Visual Fields: Left visual field deficit;Left homonymous hemianopsia Perception  Perception: Impaired Inattention/Neglect: Does not attend to left side of body Praxis Praxis: Impaired Praxis Impairment Details: Initiation;Motor planning Cognition Overall Cognitive Status: Impaired/Different from baseline Arousal/Alertness: Awake/alert Orientation Level: Oriented X4 Attention: Sustained;Selective Sustained Attention: Impaired Sustained Attention Impairment: Functional basic;Functional complex Selective Attention: Impaired Selective Attention Impairment: Functional basic;Functional complex Memory: Impaired Memory Impairment: Decreased recall of new information;Retrieval deficit;Storage deficit Awareness: Impaired Awareness Impairment: Emergent impairment Problem Solving: Impaired Problem  Solving Impairment: Verbal basic;Functional basic Safety/Judgment: Impaired Sensation Sensation Light Touch: Impaired Detail Light Touch Impaired Details: Impaired LUE Hot/Cold: Impaired Detail Hot/Cold Impaired Details: Impaired LUE Proprioception: Impaired Detail Proprioception Impaired Details: Absent LUE Stereognosis: Not tested Additional Comments: Pt able to detect some pressure to the upper arm but not able to detect pressure or light   touch in the hand or digits. Motor  Motor Motor: Hemiplegia Motor - Discharge Observations: Pt still with severe left UE and LE hemiplegia Mobility  Bed Mobility Bed Mobility: Rolling Right;Rolling Left;Sit to Supine;Supine to Sit Rolling Right: Maximal Assistance - Patient 25-49% Rolling Left: Moderate Assistance - Patient 50-74% Supine to Sit: Maximal Assistance - Patient - Patient 25-49% Sit to Supine: Moderate Assistance - Patient 50-74% Transfers Sit to Stand: 2 Helpers Stand to Sit: 2 Helpers  Trunk/Postural Assessment  Cervical Assessment Cervical Assessment: Exceptions to WFL(cervical flexion and rotation to the right) Thoracic Assessment Thoracic Assessment: Exceptions to WFL(thoracic kyphosis) Lumbar Assessment Lumbar Assessment: Exceptions to WFL(maintains posterior pelvic tilt) Postural Control Postural Control: Deficits on evaluation Protective Responses: Pt with delayed reaction to all losses of balance with max facilitation need to correct in sitting.   Balance Balance Balance Assessed: Yes Static Sitting Balance Static Sitting - Balance Support: Right upper extremity supported Static Sitting - Level of Assistance: 5: Stand by assistance Dynamic Sitting Balance Dynamic Sitting - Balance Support: Right upper extremity supported Dynamic Sitting - Level of Assistance: 2: Max assist Static Standing Balance Static Standing - Balance Support: During functional activity Static Standing - Level of Assistance: 1: +2 Total  assist;Patient percentage (comment)(20%) Dynamic Standing Balance Dynamic Standing - Level of Assistance: 1: +2 Total assist;Patient percentage (comment)(20%) Extremity/Trunk Assessment RUE Assessment RUE Assessment: Within Functional Limits General Strength Comments: AROM WFLS for ADLs LUE Assessment LUE Assessment: Exceptions to [facility] Passive Range of Motion (PROM) Comments: WFLS for all joints General Strength Comments: Pt with increased adduction tone in the shoulder noted with resistance when therapist is trying to help correct her balance.  No other active movement noted however at this time.  Brunnstrum stage I in the hand with II developing in the shoulder   Faduma Cho OTR/L 03/16/2018, 12:32 PM

## 2018-03-16 NOTE — Patient Care Conference (Signed)
Inpatient RehabilitationTeam Conference and Plan of Care Update Date: 03/15/2018   Time: 2:30 PM    Patient Name: Brandy [name]      Medical Record Number: [id number]  Date of Birth: July 04, 1940 Sex: Female         Room/Bed: 4M02C/4M02C-01 Payor Info: Payor: [employer] MEDICARE / Plan: [employer] MEDICARE / Product Type: *No Product type* /    Admitting Diagnosis: stroke  Admit Date/Time:  02/16/2018  5:18 PM Admission Comments: No comment available   Primary Diagnosis:  <principal problem not specified> Principal Problem: <principal problem not specified>  Patient Active Problem List   Diagnosis Date Noted  . Left hemiplegia ([place])   . Chronic kidney disease   . Hypokalemia   . Acute on chronic anemia   . Hypoalbuminemia due to protein-calorie malnutrition ([place])   . Hemiparesis affecting left side as late effect of stroke ([place]) 02/16/2018  . Left-sided neglect 02/16/2018  . Right middle cerebral artery stroke ([place]) 02/16/2018  . Acute embolic stroke ([place])   . Acute right MCA stroke ([place])   . History of CVA with residual deficit   . Benign essential HTN   . Chronic combined systolic and diastolic congestive heart failure ([place])   . History of fusion of cervical spine   . Stage 3 chronic kidney disease ([place])   . Acute blood loss anemia   . Stroke ([place]) 02/10/2018  . Middle cerebral artery embolism, right 02/10/2018  . Health care maintenance 11/01/2017  . Non-ischemic cardiomyopathy ([place]) 09/14/2017  . Precordial chest pain 09/14/2017  . B12 deficiency 09/13/2017  . Tachycardia 09/13/2017  . Acute ischemic stroke ([place])   . Seasonal allergies 07/13/2017  . TIA (transient ischemic attack) 07/12/2017  . Anemia 07/12/2017  . Pneumonia 06/02/2017  . Pleural effusion 06/02/2017  . Acute on chronic systolic congestive heart failure ([place])   . Elevated troponin   . Other fatigue 05/04/2017  . Dyspnea 05/04/2017  . Pseudophakia of both eyes 08/06/2016  . Chronic combined  systolic and diastolic CHF (congestive heart failure) ([place]) 07/09/2016  . Hypertensive heart disease   . Hypertension   . Nuclear sclerosis, bilateral 05/24/2016  . HTN (hypertension) 01/22/2013  . TOBACCO ABUSE 02/18/2008  . CARDIOMYOPATHY 02/18/2008  . Diverticulosis of colon 02/18/2008  . OSTEOPENIA 02/18/2008  . Disorder of bone and cartilage 02/18/2008    Expected Discharge Date: Expected Discharge Date: 03/16/18  Team Members Present: Physician leading conference: Dr. Delice Lesch Social Worker Present: Alfonse Alpers, LCSW Nurse Present: Rayetta Pigg, RN PT Present: Phylliss Bob, PTA;Barrie Folk, PT OT Present: Clyda Greener, OT SLP Present: Windell Moulding, SLP PPS Coordinator present : Daiva Nakayama, RN, CRRN;Melissa Gertie Fey     Current Status/Progress Goal Weekly Team Focus  Medical   Left side weakness secondary to right MCA infarction with right M1 occlusion status post minimal recannulization after mechanical thrombectomy as well as history of left MCA infarction April 2019.   Improve mobility, cognition, endurance  See above   Bowel/Bladder   Pt incontinent B/B. LBM 03/14/2018.  Maintain regular bowel pattern. Reduce number of incontinent episodes.  Assist with toileting needs PRN.    Swallow/Nutrition/ Hydration   regular textures, thin liquids; min assist   supervision   toleration of diet advancement   ADL's   Max assist for UB selfcare with total +2 for LB selfcare supine to sit to stand.  Max to total assist for sliding board transfers.  Brunnstrum stage II in the left arm and hand with  only flexor tone noted in the pectoral region.  Severe motor planning deficits  down graded max to total assist  neuromuscular re-education, selfcare retraining, transfer training, balance retraining, pt/family education, visual compensation.   Mobility   supervision static sitting balance up to 5 min, decreased pushing tendencies, continues to require mod/maxA A for midline with  fatigue. mod-maxA SB transfer to level surface or downhill to R only , maxX2 to dependent to L., minA w/c propulsion,   maxA transfers, modA sitting balance, maxA bed mobilty, minA w/c mobility  sitting balance, w/c mobilty    Communication   fluctuates between min assist and supervision   supervision   continue to address speech intelligibility strategies   Safety/Cognition/ Behavioral Observations  mod assist   min assist   continue to address problem solving, attention, memory, awareness, and left attention    Pain   Pt complains of pain of 5 to L shoulder. Pain managed with Tylenol.   Pain < 3  Assess pain Q shift and PRN. Medicate according to orders.    Skin   No skin issues.  Maintain skin integrity and prevent skin breakdown.  Assess skin Q shift and PRN.     Rehab Goals Patient on target to meet rehab goals: Yes Rehab Goals Revised: goals were downgraded to maximum assistance at last week's [place] and progress notes for long and short-term goals.     Barriers to Discharge  Current Status/Progress Possible Resolutions Date Resolved   Physician    Medical stability;Decreased caregiver support;Lack of/limited family support     See above  Therapies, plan for d/c to SNF      Nursing                  PT                    OT                  SLP                SW                Discharge Planning/Teaching Needs:  Pt has received bed offers and she and husband have chosen facility.  Awaiting insurance approval.  defer to next venue   Team Discussion:  Pt is mod A slide board tx to right and can propel w/c with hemi technique with min A and max cues.  Her midline orientation remains off.  Her dynamic sitting balance can be min to mod A and is improving.  Pt has trouble with motor planning.  Pt has met a lot of speech goals, at mod A level.  Revisions to Treatment Plan:  none    Continued Need for Acute Rehabilitation Level of Care: The patient requires  daily medical management by a physician with specialized training in physical medicine and rehabilitation for the following conditions: Daily direction of a multidisciplinary physical rehabilitation program to ensure safe treatment while eliciting the highest outcome that is of practical value to the patient.: Yes Daily medical management of patient stability for increased activity during participation in an intensive rehabilitation regime.: Yes Daily analysis of laboratory values and/or radiology reports with any subsequent need for medication adjustment of medical intervention for : Neurological problems   I attest that I was present, lead the team conference, and concur with the assessment and plan of the team.   , Silvestre Mesi 03/16/2018,  1:56 PM

## 2018-03-16 NOTE — Progress Notes (Signed)
Social Work Patient ID: Brandy [name], female   DOB: 1940-10-17, 77 y.o.   MRN: [id number]   CSW met with pt and her husband 03-15-18 to update them on team conference discussion and SNF process. Explained that pt is still medically ready for tx to SNF once insurance approves tx.  Pt to go to [facility].  Explained process to pt and husband.  They expressed understanding and appreciation.  CSW will continue to follow and assist as needed.

## 2018-03-16 NOTE — Progress Notes (Signed)
Called for report to [facility], spoke with Joellen Jersey. Pt will be DC today. Pt and family aware. Belongings will be packed and transported by husband.   Erie Noe, LPN

## 2018-03-16 NOTE — Progress Notes (Signed)
Please see discharge summary.  Patient stable to be discharged to SNF today.  We will see patient in 1 month for hospital follow-up.

## 2018-03-28 ENCOUNTER — Inpatient Hospital Stay (HOSPITAL_COMMUNITY)
Admission: EM | Admit: 2018-03-28 | Discharge: 2018-04-04 | DRG: 291 | Disposition: A | Discharge status: Skilled Nursing Facility | Payer: Medicare Other | Source: Skilled Nursing Facility | Attending: Internal Medicine | Admitting: Internal Medicine

## 2018-03-28 ENCOUNTER — Other Ambulatory Visit: Payer: Self-pay

## 2018-03-28 ENCOUNTER — Emergency Department (HOSPITAL_COMMUNITY): Payer: Medicare Other

## 2018-03-28 ENCOUNTER — Encounter (HOSPITAL_COMMUNITY): Payer: Self-pay | Admitting: Emergency Medicine

## 2018-03-28 DIAGNOSIS — Z8249 Family history of ischemic heart disease and other diseases of the circulatory system: Secondary | ICD-10-CM

## 2018-03-28 DIAGNOSIS — I5023 Acute on chronic systolic (congestive) heart failure: Secondary | ICD-10-CM | POA: Diagnosis not present

## 2018-03-28 DIAGNOSIS — I130 Hypertensive heart and chronic kidney disease with heart failure and stage 1 through stage 4 chronic kidney disease, or unspecified chronic kidney disease: Secondary | ICD-10-CM | POA: Diagnosis not present

## 2018-03-28 DIAGNOSIS — I5043 Acute on chronic combined systolic (congestive) and diastolic (congestive) heart failure: Secondary | ICD-10-CM | POA: Diagnosis present

## 2018-03-28 DIAGNOSIS — R778 Other specified abnormalities of plasma proteins: Secondary | ICD-10-CM

## 2018-03-28 DIAGNOSIS — I4891 Unspecified atrial fibrillation: Secondary | ICD-10-CM | POA: Diagnosis not present

## 2018-03-28 DIAGNOSIS — I272 Pulmonary hypertension, unspecified: Secondary | ICD-10-CM | POA: Diagnosis present

## 2018-03-28 DIAGNOSIS — I081 Rheumatic disorders of both mitral and tricuspid valves: Secondary | ICD-10-CM | POA: Diagnosis present

## 2018-03-28 DIAGNOSIS — I452 Bifascicular block: Secondary | ICD-10-CM | POA: Diagnosis present

## 2018-03-28 DIAGNOSIS — R1311 Dysphagia, oral phase: Secondary | ICD-10-CM | POA: Diagnosis present

## 2018-03-28 DIAGNOSIS — Z888 Allergy status to other drugs, medicaments and biological substances status: Secondary | ICD-10-CM

## 2018-03-28 DIAGNOSIS — I69354 Hemiplegia and hemiparesis following cerebral infarction affecting left non-dominant side: Secondary | ICD-10-CM

## 2018-03-28 DIAGNOSIS — I340 Nonrheumatic mitral (valve) insufficiency: Secondary | ICD-10-CM | POA: Diagnosis not present

## 2018-03-28 DIAGNOSIS — I248 Other forms of acute ischemic heart disease: Secondary | ICD-10-CM | POA: Diagnosis present

## 2018-03-28 DIAGNOSIS — I10 Essential (primary) hypertension: Secondary | ICD-10-CM | POA: Diagnosis not present

## 2018-03-28 DIAGNOSIS — Z9841 Cataract extraction status, right eye: Secondary | ICD-10-CM

## 2018-03-28 DIAGNOSIS — R0602 Shortness of breath: Secondary | ICD-10-CM | POA: Diagnosis not present

## 2018-03-28 DIAGNOSIS — I2510 Atherosclerotic heart disease of native coronary artery without angina pectoris: Secondary | ICD-10-CM | POA: Diagnosis present

## 2018-03-28 DIAGNOSIS — K5730 Diverticulosis of large intestine without perforation or abscess without bleeding: Secondary | ICD-10-CM | POA: Diagnosis present

## 2018-03-28 DIAGNOSIS — R8281 Pyuria: Secondary | ICD-10-CM | POA: Diagnosis present

## 2018-03-28 DIAGNOSIS — Z86010 Personal history of colonic polyps: Secondary | ICD-10-CM | POA: Diagnosis not present

## 2018-03-28 DIAGNOSIS — M8580 Other specified disorders of bone density and structure, unspecified site: Secondary | ICD-10-CM | POA: Diagnosis present

## 2018-03-28 DIAGNOSIS — Z87891 Personal history of nicotine dependence: Secondary | ICD-10-CM

## 2018-03-28 DIAGNOSIS — E785 Hyperlipidemia, unspecified: Secondary | ICD-10-CM | POA: Diagnosis present

## 2018-03-28 DIAGNOSIS — Z79891 Long term (current) use of opiate analgesic: Secondary | ICD-10-CM

## 2018-03-28 DIAGNOSIS — E875 Hyperkalemia: Secondary | ICD-10-CM | POA: Diagnosis not present

## 2018-03-28 DIAGNOSIS — N183 Chronic kidney disease, stage 3 (moderate): Secondary | ICD-10-CM | POA: Diagnosis present

## 2018-03-28 DIAGNOSIS — Z961 Presence of intraocular lens: Secondary | ICD-10-CM | POA: Diagnosis present

## 2018-03-28 DIAGNOSIS — I639 Cerebral infarction, unspecified: Secondary | ICD-10-CM | POA: Diagnosis not present

## 2018-03-28 DIAGNOSIS — Z981 Arthrodesis status: Secondary | ICD-10-CM

## 2018-03-28 DIAGNOSIS — R471 Dysarthria and anarthria: Secondary | ICD-10-CM | POA: Diagnosis not present

## 2018-03-28 DIAGNOSIS — J449 Chronic obstructive pulmonary disease, unspecified: Secondary | ICD-10-CM | POA: Diagnosis present

## 2018-03-28 DIAGNOSIS — R7989 Other specified abnormal findings of blood chemistry: Secondary | ICD-10-CM

## 2018-03-28 DIAGNOSIS — Z9842 Cataract extraction status, left eye: Secondary | ICD-10-CM

## 2018-03-28 DIAGNOSIS — I471 Supraventricular tachycardia: Secondary | ICD-10-CM | POA: Diagnosis not present

## 2018-03-28 DIAGNOSIS — I420 Dilated cardiomyopathy: Secondary | ICD-10-CM | POA: Diagnosis not present

## 2018-03-28 DIAGNOSIS — I428 Other cardiomyopathies: Secondary | ICD-10-CM | POA: Diagnosis present

## 2018-03-28 DIAGNOSIS — D649 Anemia, unspecified: Secondary | ICD-10-CM | POA: Diagnosis present

## 2018-03-28 DIAGNOSIS — I472 Ventricular tachycardia: Secondary | ICD-10-CM | POA: Diagnosis not present

## 2018-03-28 DIAGNOSIS — I509 Heart failure, unspecified: Secondary | ICD-10-CM

## 2018-03-28 DIAGNOSIS — R4701 Aphasia: Secondary | ICD-10-CM | POA: Diagnosis not present

## 2018-03-28 DIAGNOSIS — Z79899 Other long term (current) drug therapy: Secondary | ICD-10-CM

## 2018-03-28 DIAGNOSIS — Z9089 Acquired absence of other organs: Secondary | ICD-10-CM

## 2018-03-28 DIAGNOSIS — I361 Nonrheumatic tricuspid (valve) insufficiency: Secondary | ICD-10-CM | POA: Diagnosis not present

## 2018-03-28 DIAGNOSIS — I480 Paroxysmal atrial fibrillation: Secondary | ICD-10-CM | POA: Diagnosis present

## 2018-03-28 DIAGNOSIS — I5021 Acute systolic (congestive) heart failure: Secondary | ICD-10-CM | POA: Diagnosis not present

## 2018-03-28 DIAGNOSIS — Z7982 Long term (current) use of aspirin: Secondary | ICD-10-CM

## 2018-03-28 DIAGNOSIS — Z8673 Personal history of transient ischemic attack (TIA), and cerebral infarction without residual deficits: Secondary | ICD-10-CM | POA: Diagnosis not present

## 2018-03-28 DIAGNOSIS — R000 Tachycardia, unspecified: Secondary | ICD-10-CM

## 2018-03-28 DIAGNOSIS — Z7902 Long term (current) use of antithrombotics/antiplatelets: Secondary | ICD-10-CM

## 2018-03-28 HISTORY — DX: Cerebral infarction, unspecified: I63.9

## 2018-03-28 LAB — CBC WITH DIFFERENTIAL/PLATELET
Abs Immature Granulocytes: 0.03 10*3/uL (ref 0.00–0.07)
Basophils Absolute: 0.1 10*3/uL (ref 0.0–0.1)
Basophils Relative: 1 %
EOS ABS: 0.1 10*3/uL (ref 0.0–0.5)
EOS PCT: 1 %
HCT: 36.5 % (ref 36.0–46.0)
Hemoglobin: 11.4 g/dL — ABNORMAL LOW (ref 12.0–15.0)
Immature Granulocytes: 0 %
Lymphocytes Relative: 17 %
Lymphs Abs: 1.4 10*3/uL (ref 0.7–4.0)
MCH: 28.9 pg (ref 26.0–34.0)
MCHC: 31.2 g/dL (ref 30.0–36.0)
MCV: 92.6 fL (ref 80.0–100.0)
Monocytes Absolute: 0.7 10*3/uL (ref 0.1–1.0)
Monocytes Relative: 8 %
Neutro Abs: 6.1 10*3/uL (ref 1.7–7.7)
Neutrophils Relative %: 73 %
Platelets: 222 10*3/uL (ref 150–400)
RBC: 3.94 MIL/uL (ref 3.87–5.11)
RDW: 15.2 % (ref 11.5–15.5)
WBC: 8.4 10*3/uL (ref 4.0–10.5)
nRBC: 0 % (ref 0.0–0.2)

## 2018-03-28 LAB — TROPONIN I: Troponin I: 0.31 ng/mL (ref <0.03)

## 2018-03-28 LAB — COMPREHENSIVE METABOLIC PANEL
ALK PHOS: 41 U/L (ref 38–126)
ALT: 12 U/L (ref 0–44)
AST: 21 U/L (ref 15–41)
Albumin: 2.8 g/dL — ABNORMAL LOW (ref 3.5–5.0)
Anion gap: 11 (ref 5–15)
BUN: 26 mg/dL — ABNORMAL HIGH (ref 8–23)
CALCIUM: 9.4 mg/dL (ref 8.9–10.3)
CO2: 22 mmol/L (ref 22–32)
Chloride: 111 mmol/L (ref 98–111)
Creatinine, Ser: 1.09 mg/dL — ABNORMAL HIGH (ref 0.44–1.00)
GFR calc Af Amer: 58 mL/min — ABNORMAL LOW (ref >60)
GFR calc non Af Amer: 50 mL/min — ABNORMAL LOW (ref >60)
Glucose, Bld: 106 mg/dL — ABNORMAL HIGH (ref 70–99)
Potassium: 3.9 mmol/L (ref 3.5–5.1)
Sodium: 144 mmol/L (ref 135–145)
TOTAL PROTEIN: 6.7 g/dL (ref 6.5–8.1)
Total Bilirubin: 0.2 mg/dL — ABNORMAL LOW (ref 0.3–1.2)

## 2018-03-28 LAB — BRAIN NATRIURETIC PEPTIDE: B Natriuretic Peptide: 776.4 pg/mL — ABNORMAL HIGH (ref 0.0–100.0)

## 2018-03-28 MED ORDER — LEVOFLOXACIN IN D5W 500 MG/100ML IV SOLN
500.0000 mg | Freq: Once | INTRAVENOUS | Status: AC
  Administered 2018-03-28: 500 mg via INTRAVENOUS
  Filled 2018-03-28: qty 100

## 2018-03-28 NOTE — ED Notes (Signed)
Critical troponin of 0.31 reported to MD [facility]

## 2018-03-28 NOTE — ED Provider Notes (Signed)
[facility] EMERGENCY DEPARTMENT Provider Note   CSN: [id number] Arrival date & time: 03/28/18  2009     History   Chief Complaint Chief Complaint  Patient presents with  . Shortness of Breath    HPI Brandy [name] is a 77 y.o. female.  HPI Patient coming from [facility].  Presents with 2 days of shortness of breath.  Has had mild cough.  Denies chest pain.  Patient with previous stroke and left-sided hemiparesis.  No new weakness.  Denies fever or chills. Past Medical History:  Diagnosis Date  . Cardiomyopathy    a. EF of 25% in 07/2006 b. EF normalized by repeat echo in 2011 c. EF 35-40% by echo in 05/2017 with cath showing mild nonobstructive CAD  . Chronic combined systolic and diastolic CHF (congestive heart failure) ([place])   . Colon polyps   . Diverticulosis of colon   . Hypertension   . Hypertensive heart disease   . Osteopenia   . TIA (transient ischemic attack)   . Tobacco abuse    50 pack year    Patient Active Problem List   Diagnosis Date Noted  . Left hemiplegia ([place])   . Chronic kidney disease   . Hypokalemia   . Acute on chronic anemia   . Hypoalbuminemia due to protein-calorie malnutrition ([place])   . Hemiparesis affecting left side as late effect of stroke ([place]) 02/16/2018  . Left-sided neglect 02/16/2018  . Right middle cerebral artery stroke ([place]) 02/16/2018  . Acute embolic stroke ([place])   . Acute right MCA stroke ([place])   . History of CVA with residual deficit   . Benign essential HTN   . Chronic combined systolic and diastolic congestive heart failure ([place])   . History of fusion of cervical spine   . Stage 3 chronic kidney disease ([place])   . Acute blood loss anemia   . Stroke ([place]) 02/10/2018  . Middle cerebral artery embolism, right 02/10/2018  . Health care maintenance 11/01/2017  . Non-ischemic cardiomyopathy ([place]) 09/14/2017  . Precordial chest pain 09/14/2017  . B12 deficiency 09/13/2017  . Tachycardia 09/13/2017    . Acute ischemic stroke ([place])   . Seasonal allergies 07/13/2017  . TIA (transient ischemic attack) 07/12/2017  . Anemia 07/12/2017  . Pneumonia 06/02/2017  . Pleural effusion 06/02/2017  . Acute on chronic systolic congestive heart failure ([place])   . Elevated troponin   . Other fatigue 05/04/2017  . Dyspnea 05/04/2017  . Pseudophakia of both eyes 08/06/2016  . Chronic combined systolic and diastolic CHF (congestive heart failure) ([place]) 07/09/2016  . Hypertensive heart disease   . Hypertension   . Nuclear sclerosis, bilateral 05/24/2016  . HTN (hypertension) 01/22/2013  . TOBACCO ABUSE 02/18/2008  . CARDIOMYOPATHY 02/18/2008  . Diverticulosis of colon 02/18/2008  . OSTEOPENIA 02/18/2008  . Disorder of bone and cartilage 02/18/2008    Past Surgical History:  Procedure Laterality Date  . BREAST BIOPSY    . CARPAL TUNNEL RELEASE     right hand  . CERVICAL FUSION    . CESAREAN SECTION     2 times  . COLONOSCOPY  7096   pt uncertain as to whether polypectomy required or performed  . ECTOPIC PREGNANCY SURGERY    . IR ANGIO VERTEBRAL SEL SUBCLAVIAN INNOMINATE UNI R MOD SED  02/10/2018  . IR CT HEAD LTD  02/10/2018  . IR PERCUTANEOUS ART THROMBECTOMY/INFUSION INTRACRANIAL INC DIAG ANGIO  02/10/2018  . RADIOLOGY WITH ANESTHESIA N/A 02/10/2018  Procedure: RADIOLOGY WITH ANESTHESIA;  Surgeon: Luanne Bras, MD;  Location: [facility];  Service: Radiology;  Laterality: N/A;  . RIGHT/LEFT HEART CATH AND CORONARY ANGIOGRAPHY N/A 06/03/2017   Procedure: RIGHT/LEFT HEART CATH AND CORONARY ANGIOGRAPHY;  Surgeon: Jettie Booze, MD;  Location: [facility] CV LAB;  Service: Cardiovascular;  Laterality: N/A;  . TEE WITHOUT CARDIOVERSION N/A 02/13/2018   Procedure: TRANSESOPHAGEAL ECHOCARDIOGRAM (TEE);  Surgeon: Sueanne Margarita, MD;  Location: [facility] ENDOSCOPY;  Service: Cardiovascular;  Laterality: N/A;  . TONSILLECTOMY       OB History   No obstetric history on file.      Home  Medications    Prior to Admission medications   Medication Sig Start Date End Date Taking? Authorizing Provider  acetaminophen (TYLENOL) 325 MG tablet Take 2 tablets (650 mg total) by mouth every 4 (four) hours as needed for mild pain (or temp > 37.5 C (99.5 F)). Patient taking differently: Take 650 mg by mouth 4 (four) times daily.  03/16/18  Yes Angiulli, Lavon Paganini, PA-C  aspirin 325 MG tablet Take 1 tablet (325 mg total) by mouth daily. 02/17/18  Yes Vonzella Nipple, NP  atorvastatin (LIPITOR) 10 MG tablet Take 1 tablet (10 mg total) by mouth daily at 6 PM. 02/16/18  Yes Vonzella Nipple, NP  carvedilol (COREG) 12.5 MG tablet Take 1 tablet (12.5 mg total) by mouth 2 (two) times daily with a meal. 08/25/17  Yes Herminio Commons, MD  clopidogrel (PLAVIX) 75 MG tablet Take 1 tablet (75 mg total) by mouth daily. 02/17/18  Yes Vonzella Nipple, NP  donepezil (ARICEPT) 5 MG tablet Take 1 tablet by mouth at bedtime. 11/15/17  Yes [provider]  methylphenidate (RITALIN) 5 MG tablet Take 1 tablet (5 mg total) by mouth 2 (two) times daily with breakfast and lunch. 03/16/18  Yes Angiulli, Lavon Paganini, PA-C  traMADol (ULTRAM) 50 MG tablet Take 50 mg by mouth 2 (two) times daily.   Yes [provider]  umeclidinium-vilanterol (ANORO ELLIPTA) 62.5-25 MCG/INH AEPB Inhale 1 puff into the lungs daily. 03/16/18  Yes Angiulli, Lavon Paganini, PA-C    Family History Family History  Problem Relation Age of Onset  . Hypertension Brother   . Hypertension Maternal Grandmother   . Cancer Paternal Grandmother     Social History Social History   Tobacco Use  . Smoking status: Former Smoker    Packs/day: 1.00    Years: 50.00    Pack years: 50.00    Types: Cigarettes    Start date: 09/02/1960    Last attempt to quit: 03/29/2008    Years since quitting: 10.0  . Smokeless tobacco: Never Used  Substance Use Topics  . Alcohol use: No    Alcohol/week: 0.0 standard drinks  . Drug use:  Never     Allergies   Lisinopril   Review of Systems Review of Systems  Constitutional: Negative for chills and fever.  Eyes: Negative for visual disturbance.  Respiratory: Positive for cough and shortness of breath.   Cardiovascular: Positive for leg swelling. Negative for chest pain and palpitations.  Gastrointestinal: Negative for abdominal pain, constipation, diarrhea, nausea and vomiting.  Genitourinary: Negative for dysuria, flank pain and frequency.  Musculoskeletal: Negative for back pain and neck pain.  Skin: Negative for rash.  Neurological: Positive for weakness. Negative for dizziness and headaches.  All other systems reviewed and are negative.    Physical Exam Updated Vital Signs Temp 98.2 F (36.8 C) (Oral)  Ht 5\' 4"  (1.626 m)   Wt 85.7 kg   BMI 32.43 kg/m   Physical Exam Vitals signs and nursing note reviewed.  Constitutional:      General: She is not in acute distress.    Appearance: Normal appearance. She is well-developed.  HENT:     Head: Normocephalic and atraumatic.     Nose: Nose normal.     Mouth/Throat:     Mouth: Mucous membranes are moist.  Eyes:     Pupils: Pupils are equal, round, and reactive to light.  Neck:     Musculoskeletal: Normal range of motion and neck supple. No neck rigidity or muscular tenderness.  Pulmonary:     Effort: Pulmonary effort is normal.     Breath sounds: Normal breath sounds.  Abdominal:     General: Bowel sounds are normal.     Palpations: Abdomen is soft.     Tenderness: There is no abdominal tenderness. There is no guarding or rebound.  Musculoskeletal: Normal range of motion.        General: No tenderness.     Right lower leg: Edema present.     Left lower leg: Edema present.     Comments: 1+ bilateral lower extremity pitting edema.  Distal pulses intact.  Lymphadenopathy:     Cervical: No cervical adenopathy.  Skin:    General: Skin is warm and dry.     Findings: No erythema or rash.    Neurological:     Mental Status: She is alert and oriented to person, place, and time.     Comments: Left-sided hemiparesis.  5/5 motor right upper and right lower extremities.  Psychiatric:        Behavior: Behavior normal.      ED Treatments / Results  Labs (all labs ordered are listed, but only abnormal results are displayed) Labs Reviewed  CBC WITH DIFFERENTIAL/PLATELET - Abnormal; Notable for the following components:      Result Value   Hemoglobin 11.4 (*)    All other components within normal limits  BRAIN NATRIURETIC PEPTIDE - Abnormal; Notable for the following components:   B Natriuretic Peptide 776.4 (*)    All other components within normal limits  COMPREHENSIVE METABOLIC PANEL - Abnormal; Notable for the following components:   Glucose, Bld 106 (*)    BUN 26 (*)    Creatinine, Ser 1.09 (*)    Albumin 2.8 (*)    Total Bilirubin 0.2 (*)    GFR calc non Af Amer 50 (*)    GFR calc Af Amer 58 (*)    All other components within normal limits  TROPONIN I - Abnormal; Notable for the following components:   Troponin I 0.31 (*)    All other components within normal limits  URINALYSIS, ROUTINE W REFLEX MICROSCOPIC    EKG EKG Interpretation  Date/Time:  Tuesday March 28 2018 20:13:14 EST Ventricular Rate:  112 PR Interval:    QRS Duration: 102 QT Interval:  341 QTC Calculation: 466 R Axis:   -176 Text Interpretation:  Sinus tachycardia Ventricular premature complex Confirmed by Julianne Rice [phone]) on 03/28/2018 8:26:50 PM   Radiology Dg Chest Port 1 View  Result Date: 03/28/2018 CLINICAL DATA:  Shortness of breath for 2 days EXAM: PORTABLE CHEST 1 VIEW COMPARISON:  February 15, 2018 FINDINGS: The heart size and mediastinal contours are stable. Heart size is enlarged. There is mild patchy opacity of the medial left lung base. There is no pulmonary edema or pleural effusion.  The visualized skeletal structures are stable. IMPRESSION: Mild patchy opacity of  the medial left lung base, developing pneumonia is not excluded. Electronically Signed   By: Abelardo Diesel M.D.   On: 03/28/2018 21:12    Procedures Procedures (including critical care time)  Medications Ordered in ED Medications  levofloxacin (LEVAQUIN) IVPB 500 mg (has no administration in time range)     Initial Impression / Assessment and Plan / ED Course  I have reviewed the triage vital signs and the nursing notes.  Pertinent labs & imaging results that were available during my care of the patient were reviewed by me and considered in my medical decision making (see chart for details).    Mild elevation in troponin and BNP.  Patient denies recent chest pain.  She has a infiltrate in the left base concerning for early pneumonia.  Will cover with antibiotics.  Discussed with cardiology and recommend hospitalist admission and trending troponins.  Hospitalist will see patient in the emergency department   Final Clinical Impressions(s) / ED Diagnoses   Final diagnoses:  Shortness of breath  Elevated troponin    ED Discharge Orders    None       Julianne Rice, MD 03/28/18 2243

## 2018-03-28 NOTE — ED Triage Notes (Signed)
Pt presents to ED from [facility]. Pt complains of increased SOB x2days. Pt has hx of CHF and Afib. Ems reports lung sounds clear. Pt also states new bed sores on her bottom. Pt is not ambulatory.Pt has left sided deficits from previous stroke  BP 130 palpated HR 104 RR 20 98% 3L O2 CBG 124

## 2018-03-29 ENCOUNTER — Inpatient Hospital Stay (HOSPITAL_COMMUNITY): Payer: Medicare Other

## 2018-03-29 DIAGNOSIS — E785 Hyperlipidemia, unspecified: Secondary | ICD-10-CM

## 2018-03-29 DIAGNOSIS — I10 Essential (primary) hypertension: Secondary | ICD-10-CM

## 2018-03-29 DIAGNOSIS — I248 Other forms of acute ischemic heart disease: Secondary | ICD-10-CM

## 2018-03-29 DIAGNOSIS — I639 Cerebral infarction, unspecified: Secondary | ICD-10-CM

## 2018-03-29 DIAGNOSIS — I5043 Acute on chronic combined systolic (congestive) and diastolic (congestive) heart failure: Secondary | ICD-10-CM

## 2018-03-29 DIAGNOSIS — I4891 Unspecified atrial fibrillation: Secondary | ICD-10-CM

## 2018-03-29 LAB — TROPONIN I
TROPONIN I: 0.27 ng/mL — AB (ref <0.03)
Troponin I: 0.31 ng/mL (ref <0.03)
Troponin I: 0.33 ng/mL (ref <0.03)

## 2018-03-29 LAB — RESPIRATORY PANEL BY PCR
Adenovirus: NOT DETECTED
Bordetella pertussis: NOT DETECTED
Chlamydophila pneumoniae: NOT DETECTED
Coronavirus 229E: NOT DETECTED
Coronavirus HKU1: NOT DETECTED
Coronavirus NL63: NOT DETECTED
Coronavirus OC43: NOT DETECTED
INFLUENZA A-RVPPCR: NOT DETECTED
Influenza B: NOT DETECTED
MYCOPLASMA PNEUMONIAE-RVPPCR: NOT DETECTED
Metapneumovirus: NOT DETECTED
PARAINFLUENZA VIRUS 4-RVPPCR: NOT DETECTED
Parainfluenza Virus 1: NOT DETECTED
Parainfluenza Virus 2: NOT DETECTED
Parainfluenza Virus 3: NOT DETECTED
Respiratory Syncytial Virus: NOT DETECTED
Rhinovirus / Enterovirus: NOT DETECTED

## 2018-03-29 LAB — BASIC METABOLIC PANEL
Anion gap: 11 (ref 5–15)
BUN: 25 mg/dL — ABNORMAL HIGH (ref 8–23)
CO2: 21 mmol/L — ABNORMAL LOW (ref 22–32)
Calcium: 9 mg/dL (ref 8.9–10.3)
Chloride: 112 mmol/L — ABNORMAL HIGH (ref 98–111)
Creatinine, Ser: 0.92 mg/dL (ref 0.44–1.00)
GFR calc Af Amer: >60 mL/min (ref >60)
GFR calc non Af Amer: >60 mL/min (ref >60)
Glucose, Bld: 84 mg/dL (ref 70–99)
Potassium: 5.2 mmol/L — ABNORMAL HIGH (ref 3.5–5.1)
Sodium: 144 mmol/L (ref 135–145)

## 2018-03-29 LAB — MAGNESIUM: Magnesium: 1.9 mg/dL (ref 1.7–2.4)

## 2018-03-29 LAB — URINALYSIS, ROUTINE W REFLEX MICROSCOPIC
Bilirubin Urine: NEGATIVE
Glucose, UA: NEGATIVE mg/dL
Ketones, ur: NEGATIVE mg/dL
Nitrite: NEGATIVE
PH: 6 (ref 5.0–8.0)
Protein, ur: 30 mg/dL — AB
Specific Gravity, Urine: 1.027 (ref 1.005–1.030)
WBC, UA: >50 WBC/hpf — ABNORMAL HIGH (ref 0–5)

## 2018-03-29 LAB — TSH: TSH: 1.033 u[IU]/mL (ref 0.350–4.500)

## 2018-03-29 LAB — PROTIME-INR
INR: 1.35
Prothrombin Time: 16.6 seconds — ABNORMAL HIGH (ref 11.4–15.2)

## 2018-03-29 LAB — GLUCOSE, CAPILLARY: Glucose-Capillary: 93 mg/dL (ref 70–99)

## 2018-03-29 LAB — APTT: aPTT: 26 seconds (ref 24–36)

## 2018-03-29 MED ORDER — CARVEDILOL 12.5 MG PO TABS
12.5000 mg | ORAL_TABLET | Freq: Two times a day (BID) | ORAL | Status: DC
  Administered 2018-03-29: 12.5 mg via ORAL
  Filled 2018-03-29: qty 1

## 2018-03-29 MED ORDER — IPRATROPIUM-ALBUTEROL 0.5-2.5 (3) MG/3ML IN SOLN
3.0000 mL | Freq: Two times a day (BID) | RESPIRATORY_TRACT | Status: DC
  Administered 2018-03-29 – 2018-04-04 (×12): 3 mL via RESPIRATORY_TRACT
  Filled 2018-03-29 (×13): qty 3

## 2018-03-29 MED ORDER — APIXABAN 5 MG PO TABS
5.0000 mg | ORAL_TABLET | Freq: Two times a day (BID) | ORAL | Status: DC
  Administered 2018-03-29 – 2018-04-04 (×12): 5 mg via ORAL
  Filled 2018-03-29 (×12): qty 1

## 2018-03-29 MED ORDER — CLOPIDOGREL BISULFATE 75 MG PO TABS
75.0000 mg | ORAL_TABLET | Freq: Every day | ORAL | Status: DC
  Administered 2018-03-29: 75 mg via ORAL
  Filled 2018-03-29: qty 1

## 2018-03-29 MED ORDER — ATORVASTATIN CALCIUM 40 MG PO TABS
40.0000 mg | ORAL_TABLET | Freq: Every day | ORAL | Status: DC
  Administered 2018-03-29 – 2018-04-03 (×7): 40 mg via ORAL
  Filled 2018-03-29 (×8): qty 1

## 2018-03-29 MED ORDER — TRAMADOL HCL 50 MG PO TABS
50.0000 mg | ORAL_TABLET | Freq: Four times a day (QID) | ORAL | Status: DC | PRN
  Administered 2018-03-29 – 2018-04-04 (×9): 50 mg via ORAL
  Filled 2018-03-29 (×11): qty 1

## 2018-03-29 MED ORDER — DONEPEZIL HCL 5 MG PO TABS
5.0000 mg | ORAL_TABLET | Freq: Every day | ORAL | Status: DC
  Administered 2018-03-29 – 2018-04-03 (×6): 5 mg via ORAL
  Filled 2018-03-29 (×7): qty 1

## 2018-03-29 MED ORDER — AZITHROMYCIN 250 MG PO TABS: 250.0000 mg | ORAL_TABLET | Freq: Every day | ORAL | Status: DC

## 2018-03-29 MED ORDER — ARFORMOTEROL TARTRATE 15 MCG/2ML IN NEBU
15.0000 ug | INHALATION_SOLUTION | Freq: Two times a day (BID) | RESPIRATORY_TRACT | Status: DC
  Administered 2018-03-29 – 2018-04-04 (×12): 15 ug via RESPIRATORY_TRACT
  Filled 2018-03-29 (×14): qty 2

## 2018-03-29 MED ORDER — SODIUM CHLORIDE 0.9 % IV SOLN: 250.0000 mL | INTRAVENOUS | Status: DC | PRN

## 2018-03-29 MED ORDER — ACETAMINOPHEN 325 MG PO TABS
650.0000 mg | ORAL_TABLET | ORAL | Status: DC | PRN
  Administered 2018-03-30 – 2018-04-04 (×3): 650 mg via ORAL
  Filled 2018-03-29 (×6): qty 2

## 2018-03-29 MED ORDER — SODIUM CHLORIDE 0.9 % IV SOLN
1.0000 g | INTRAVENOUS | Status: DC
  Administered 2018-03-29: 1 g via INTRAVENOUS
  Filled 2018-03-29: qty 10

## 2018-03-29 MED ORDER — ASPIRIN 325 MG PO TABS
325.0000 mg | ORAL_TABLET | Freq: Every day | ORAL | Status: DC
  Administered 2018-03-29: 325 mg via ORAL
  Filled 2018-03-29: qty 1

## 2018-03-29 MED ORDER — AZITHROMYCIN 500 MG PO TABS: 500.0000 mg | ORAL_TABLET | Freq: Every day | ORAL | Status: DC

## 2018-03-29 MED ORDER — METOPROLOL TARTRATE 5 MG/5ML IV SOLN
5.0000 mg | Freq: Once | INTRAVENOUS | Status: AC
  Administered 2018-03-29: 2.5 mg via INTRAVENOUS
  Filled 2018-03-29: qty 5

## 2018-03-29 MED ORDER — SODIUM CHLORIDE 0.9% FLUSH
3.0000 mL | Freq: Two times a day (BID) | INTRAVENOUS | Status: DC
  Administered 2018-03-29 – 2018-04-04 (×11): 3 mL via INTRAVENOUS

## 2018-03-29 MED ORDER — ONDANSETRON HCL 4 MG/2ML IJ SOLN: 4.0000 mg | Freq: Four times a day (QID) | INTRAMUSCULAR | Status: DC | PRN

## 2018-03-29 MED ORDER — IPRATROPIUM-ALBUTEROL 0.5-2.5 (3) MG/3ML IN SOLN
3.0000 mL | Freq: Three times a day (TID) | RESPIRATORY_TRACT | Status: DC
  Administered 2018-03-29: 3 mL via RESPIRATORY_TRACT
  Filled 2018-03-29: qty 3

## 2018-03-29 MED ORDER — POTASSIUM CHLORIDE CRYS ER 20 MEQ PO TBCR
20.0000 meq | EXTENDED_RELEASE_TABLET | Freq: Once | ORAL | Status: AC
  Administered 2018-03-29: 20 meq via ORAL
  Filled 2018-03-29: qty 1

## 2018-03-29 MED ORDER — SODIUM CHLORIDE 0.9% FLUSH: 3.0000 mL | INTRAVENOUS | Status: DC | PRN

## 2018-03-29 MED ORDER — CARVEDILOL 12.5 MG PO TABS
12.5000 mg | ORAL_TABLET | Freq: Once | ORAL | Status: AC
  Administered 2018-03-29: 12.5 mg via ORAL
  Filled 2018-03-29: qty 1

## 2018-03-29 MED ORDER — FUROSEMIDE 10 MG/ML IJ SOLN
40.0000 mg | Freq: Every day | INTRAMUSCULAR | Status: DC
  Filled 2018-03-29: qty 4

## 2018-03-29 MED ORDER — METOPROLOL TARTRATE 5 MG/5ML IV SOLN: 5.0000 mg | Freq: Four times a day (QID) | INTRAVENOUS | Status: DC | PRN

## 2018-03-29 MED ORDER — FUROSEMIDE 10 MG/ML IJ SOLN
40.0000 mg | Freq: Once | INTRAMUSCULAR | Status: AC
  Administered 2018-03-29: 40 mg via INTRAVENOUS
  Filled 2018-03-29: qty 4

## 2018-03-29 MED ORDER — METOPROLOL TARTRATE 5 MG/5ML IV SOLN
2.5000 mg | Freq: Once | INTRAVENOUS | Status: AC
  Administered 2018-03-29: 2.5 mg via INTRAVENOUS
  Filled 2018-03-29: qty 5

## 2018-03-29 MED ORDER — ENOXAPARIN SODIUM 40 MG/0.4ML ~~LOC~~ SOLN
40.0000 mg | SUBCUTANEOUS | Status: DC
  Filled 2018-03-29 (×2): qty 0.4

## 2018-03-29 MED ORDER — CARVEDILOL 25 MG PO TABS
25.0000 mg | ORAL_TABLET | Freq: Two times a day (BID) | ORAL | Status: DC
  Administered 2018-03-29 – 2018-04-04 (×12): 25 mg via ORAL
  Filled 2018-03-29 (×12): qty 1

## 2018-03-29 NOTE — Progress Notes (Signed)
ANTICOAGULATION CONSULT NOTE - Initial Consult  Pharmacy Consult for apixaban Indication: atrial fibrillation  Allergies  Allergen Reactions  . Lisinopril Swelling    Patient Measurements: Height: 5\' 4"  (162.6 cm) Weight: 188 lb 15 oz (85.7 kg) IBW/kg (Calculated) : 54.7  Vital Signs: Temp: 97.6 F (36.4 C) (01/01 1508) Temp Source: Oral (01/01 1508) BP: 113/94 (01/01 1508) Pulse Rate: 155 (01/01 1508)  Labs: Recent Labs    03/28/18 2018 03/29/18 0420 03/29/18 1004  HGB 11.4*  --   --   HCT 36.5  --   --   PLT 222  --   --   APTT  --  26  --   LABPROT  --  16.6*  --   INR  --  1.35  --   CREATININE 1.09* 0.92  --   TROPONINI 0.31* 0.27* 0.31*    Estimated Creatinine Clearance: 56 mL/min (by C-G formula based on SCr of 0.92 mg/dL).   Medical History: Past Medical History:  Diagnosis Date  . Cardiomyopathy    a. EF of 25% in 07/2006 b. EF normalized by repeat echo in 2011 c. EF 35-40% by echo in 05/2017 with cath showing mild nonobstructive CAD  . Chronic combined systolic and diastolic CHF (congestive heart failure) ([place])   . Colon polyps   . Diverticulosis of colon   . Hypertension   . Hypertensive heart disease   . Osteopenia   . TIA (transient ischemic attack)   . Tobacco abuse    50 pack year    Medications:  Scheduled:  . apixaban  5 mg Oral BID  . arformoterol  15 mcg Nebulization BID  . atorvastatin  40 mg Oral q1800  . [START ON 03/30/2018] azithromycin  500 mg Oral Daily   Followed by  . [START ON 03/31/2018] azithromycin  250 mg Oral Daily  . carvedilol  25 mg Oral BID WC  . donepezil  5 mg Oral QHS  . [START ON 03/30/2018] furosemide  40 mg Intravenous Daily  . ipratropium-albuterol  3 mL Nebulization BID  . metoprolol tartrate  5 mg Intravenous Once  . sodium chloride flush  3 mL Intravenous Q12H   Infusions:  . sodium chloride    . cefTRIAXone (ROCEPHIN)  IV Stopped (03/29/18 0558)   PRN: sodium chloride, acetaminophen, ondansetron  (ZOFRAN) IV, sodium chloride flush, traMADol  Assessment: 78 yo F with newly diagnosed atrial fibrillation. Patient was taking aspirin 325 mg daily and clopidogrel 75 mg daily PTA for history of recurrent CVA. Given the development of atrial fibrillation, pharmacy has been consulted to start apixaban alone for systemic anticoagulation - aspirin and clopidogrel have been discontinued by provider.  Age 62 Weight 85.7 kg SCr 0.92 (estimated CrCl ~ 56 mL/min)   Plan:  Apixaban 5 mg PO BID Discontinue enoxaparin Monitor for signs/symptoms of bleeding  Vertis Kelch, PharmD PGY1 Pharmacy Resident Phone [phone] 03/29/2018       3:47 PM

## 2018-03-29 NOTE — ED Notes (Signed)
Diet tray ordered 

## 2018-03-29 NOTE — Consult Note (Signed)
Cardiology Consultation:   Patient ID: Brandy [name] MRN: [id number]; DOB: 03-Nov-1940  Admit date: 03/28/2018 Date of Consult: 03/29/2018  Primary Care Provider: [name], MD Primary Cardiologist: Brandy Sable, MD  Primary Electrophysiologist:  Brandy Peru, MD   Patient Profile:   Brandy [name] is a 78 y.o. female with a hx of nonischemic cardiomyopathy who is being seen today for the evaluation of atrial fibrillation and acute on chronic combined systolic and diastolic heart failure at the request of Dr. Jonnie [name].  History of Present Illness:   Brandy [name] is a 78 year old woman who is well-known to me from the outpatient setting.  Past medical history includes chronic combined systolic and diastolic heart failure/nonischemic cardiomyopathy and history of CVA.  She was most recently evaluated by cardiology in November 2019 when she was hospitalized with a right MCA infarction.  At that time she was noted to have frequent atrial ectopy and it was recommended she wear a 30-day event monitor as she is at high risk for atrial fibrillation.  She underwent a TEE on 02/13/2018 which demonstrated severely reduced left ventricular systolic function, LVEF 30 to 35%, mild mitral regurgitation, possible early forming thrombus in the left atrial appendage versus artifact from posterior wall of appendage.  There was moderate left atrial dilatation.  There was also mild tricuspid regurgitation.  When she was seen in the office in November for an outpatient cardiology appointment, Lasix was changed from 20 mg daily due to 20 mg as needed given poor oral intake.  She presented to the ED from a skilled nursing facility with a 2-day history of shortness of breath and nonproductive cough.  She denies orthopnea, paroxysmal nocturnal dyspnea and chest pain.  There was some mildly increased bilateral leg swelling.  I personally reviewed the most recent ECG performed at 1253 which demonstrates rapid  atrial fibrillation, 144 bpm, with PVCs and left anterior fascicular block.  Troponins nonspecifically elevated at 0.31, 0.27, and 0.31.  TSH is normal.  Potassium mildly elevated at 5.2.  Magnesium normal at 1.9.  BNP elevated at 776 and had been 369 six months ago.  Chest x-ray yesterday showed mild patchy opacity of the medial left lung base.  Developing pneumonia could not be excluded.  Chest x-ray today shows improved aeration in the left lung base without convincing evidence of residual consolidation.  There were moderate to severe changes of acute bronchitis and/or asthma without focal airspace pneumonia.  There was stable cardiomegaly without pulmonary edema.  Of note, cardiac catheterization on 06/03/2017 showed mild nonobstructive disease with a mid RCA 25% stenosis and a 25% stenosis in the ostial second diagonal.  There was mild pulmonary hypertension with volume overload at that time.  Her husband and her nurse are in the room.  She currently denies chest pain, palpitations, shortness of breath.  She says she feels better.  Her leg swelling has resolved.  Her husband said she has not had anything to eat today and her appetite overall is diminished.    Past Medical History:  Diagnosis Date  . Cardiomyopathy    a. EF of 25% in 07/2006 b. EF normalized by repeat echo in 2011 c. EF 35-40% by echo in 05/2017 with cath showing mild nonobstructive CAD  . Chronic combined systolic and diastolic CHF (congestive heart failure) ([place])   . Colon polyps   . Diverticulosis of colon   . Hypertension   . Hypertensive heart disease   . Osteopenia   . TIA (transient ischemic  attack)   . Tobacco abuse    50 pack year    Past Surgical History:  Procedure Laterality Date  . BREAST BIOPSY    . CARPAL TUNNEL RELEASE     right hand  . CERVICAL FUSION    . CESAREAN SECTION     2 times  . COLONOSCOPY  5916   pt uncertain as to whether polypectomy required or performed  . ECTOPIC  PREGNANCY SURGERY    . IR ANGIO VERTEBRAL SEL SUBCLAVIAN INNOMINATE UNI R MOD SED  02/10/2018  . IR CT HEAD LTD  02/10/2018  . IR PERCUTANEOUS ART THROMBECTOMY/INFUSION INTRACRANIAL INC DIAG ANGIO  02/10/2018  . RADIOLOGY WITH ANESTHESIA N/A 02/10/2018   Procedure: RADIOLOGY WITH ANESTHESIA;  Surgeon: Luanne Bras, MD;  Location: [facility];  Service: Radiology;  Laterality: N/A;  . RIGHT/LEFT HEART CATH AND CORONARY ANGIOGRAPHY N/A 06/03/2017   Procedure: RIGHT/LEFT HEART CATH AND CORONARY ANGIOGRAPHY;  Surgeon: Jettie Booze, MD;  Location: [facility];  Service: Cardiovascular;  Laterality: N/A;  . TEE WITHOUT CARDIOVERSION N/A 02/13/2018   Procedure: TRANSESOPHAGEAL ECHOCARDIOGRAM (TEE);  Surgeon: Sueanne Margarita, MD;  Location: [facility] ENDOSCOPY;  Service: Cardiovascular;  Laterality: N/A;  . TONSILLECTOMY         Inpatient Medications: Scheduled Meds: . arformoterol  15 mcg Nebulization BID  . aspirin  325 mg Oral Daily  . atorvastatin  40 mg Oral q1800  . [START ON 03/30/2018] azithromycin  500 mg Oral Daily   Followed by  . [START ON 03/31/2018] azithromycin  250 mg Oral Daily  . carvedilol  25 mg Oral BID WC  . clopidogrel  75 mg Oral Daily  . donepezil  5 mg Oral QHS  . enoxaparin (LOVENOX) injection  40 mg Subcutaneous Q24H  . [START ON 03/30/2018] furosemide  40 mg Intravenous Daily  . ipratropium-albuterol  3 mL Nebulization BID  . metoprolol tartrate  5 mg Intravenous Once  . sodium chloride flush  3 mL Intravenous Q12H   Continuous Infusions: . sodium chloride    . cefTRIAXone (ROCEPHIN)  IV Stopped (03/29/18 0558)   PRN Meds: sodium chloride, acetaminophen, ondansetron (ZOFRAN) IV, sodium chloride flush, traMADol  Allergies:    Allergies  Allergen Reactions  . Lisinopril Swelling    Social History:   Social History   Socioeconomic History  . Marital status: Married    Spouse name: Not on file  . Number of children: Not on file  . Years of  education: Not on file  . Highest education level: Not on file  Occupational History  . Occupation: Former Product manager: RETIRED  Social Needs  . Financial resource strain: Not on file  . Food insecurity:    Worry: Not on file    Inability: Not on file  . Transportation needs:    Medical: Not on file    Non-medical: Not on file  Tobacco Use  . Smoking status: Former Smoker    Packs/day: 1.00    Years: 50.00    Pack years: 50.00    Types: Cigarettes    Start date: 09/02/1960    Last attempt to quit: 03/29/2008    Years since quitting: 10.0  . Smokeless tobacco: Never Used  Substance and Sexual Activity  . Alcohol use: No    Alcohol/week: 0.0 standard drinks  . Drug use: Never  . Sexual activity: Not Currently  Lifestyle  . Physical activity:    Days per week: Not on file  Minutes per session: Not on file  . Stress: Not on file  Relationships  . Social connections:    Talks on phone: Not on file    Gets together: Not on file    Attends religious service: Not on file    Active member of club or organization: Not on file    Attends meetings of clubs or organizations: Not on file    Relationship status: Not on file  . Intimate partner violence:    Fear of current or ex partner: Not on file    Emotionally abused: Not on file    Physically abused: Not on file    Forced sexual activity: Not on file  Other Topics Concern  . Not on file  Social History Narrative   Married with 2 adult children, resides with spouse     Family History:    Family History  Problem Relation Age of Onset  . Hypertension Brother   . Hypertension Maternal Grandmother   . Cancer Paternal Grandmother      ROS:  Please see the history of present illness.   All other ROS reviewed and negative.     Physical Exam/Data:   Vitals:   03/29/18 1100 03/29/18 1142 03/29/18 1349 03/29/18 1400  BP: 131/82 129/87 122/85 (!) 123/98  Pulse: (!) 130 (!) 122 (!) 130 (!) 129  Resp: (!) 24 18 18     Temp:      TempSrc:      SpO2: 90% 95% 95% 99%  Weight:      Height:        Intake/Output Summary (Last 24 hours) at 03/29/2018 1451 Last data filed at 03/29/2018 0858 Gross per 24 hour  Intake 299.87 ml  Output 1200 ml  Net -900.13 ml   Filed Weights   03/28/18 2012  Weight: 85.7 kg   Body mass index is 32.43 kg/m.  General:  Well nourished, well developed, in no acute distress HEENT: normal Lymph: no adenopathy Neck: no JVD Endocrine:  No thryomegaly Cardiac:  Tachycardic, irregular, normal S1/S2, no S3, no murmurs Lungs:  Diminished b/l, no wheezes  Abd: soft, nontender, no hepatomegaly  Ext: no edema Musculoskeletal:  No deformities Skin: warm and dry  Neuro:  Dysarthric Psych:  Normal affect   EKG:  The EKG was personally reviewed and demonstrates:  rapid atrial fibrillation, 144 bpm, with PVCs and left anterior fascicular block. Telemetry:  Telemetry was personally reviewed and demonstrates:  Rapid a fib  Relevant CV Studies: TEE on 02/13/2018 demonstrated severely reduced left ventricular systolic function, LVEF 30 to 35%, mild mitral regurgitation, possible early forming thrombus in the left atrial appendage versus artifact from posterior wall of appendage.  There was moderate left atrial dilatation.  There was also mild tricuspid regurgitation.  Cardiac catheterization on 06/03/2017 showed mild nonobstructive disease with a mid RCA 25% stenosis and a 25% stenosis in the ostial second diagonal.  There was mild pulmonary hypertension with volume overload at that time.    Laboratory Data:  Chemistry Recent Labs  Lab 03/28/18 2018 03/29/18 0420  NA 144 144  K 3.9 5.2*  CL 111 112*  CO2 22 21*  GLUCOSE 106* 84  BUN 26* 25*  CREATININE 1.09* 0.92  CALCIUM 9.4 9.0  GFRNONAA 50* >60  GFRAA 58* >60  ANIONGAP 11 11    Recent Labs  Lab 03/28/18 2018  PROT 6.7  ALBUMIN 2.8*  AST 21  ALT 12  ALKPHOS 41  BILITOT 0.2*   Hematology  Recent Labs  Lab  03/28/18 2018  WBC 8.4  RBC 3.94  HGB 11.4*  HCT 36.5  MCV 92.6  MCH 28.9  MCHC 31.2  RDW 15.2  PLT 222   Cardiac Enzymes Recent Labs  Lab 03/28/18 2018 03/29/18 0420 03/29/18 1004  TROPONINI 0.31* 0.27* 0.31*   No results for input(s): TROPIPOC in the last 168 hours.  BNP Recent Labs  Lab 03/28/18 2018  BNP 776.4*    DDimer No results for input(s): DDIMER in the last 168 hours.  Radiology/Studies:  Dg Chest 2 View  Result Date: 03/29/2018 CLINICAL DATA:  78 year old who had acute RIGHT MIDDLE cerebral artery occlusion with endovascular revascularization 02/10/2018. Follow-up possible LEFT lung base pneumonia. EXAM: CHEST - 2 VIEW COMPARISON:  03/28/2018, 02/15/2018 and earlier. FINDINGS: AP ERECT and LATERAL images were obtained. The patient was unable to raise the LEFT arm due to her prior stroke, partially obscuring the LATERAL image. Cardiac silhouette moderately enlarged, unchanged. Thoracic aorta mildly atherosclerotic, unchanged. Hilar and mediastinal contours otherwise unremarkable. Since the examination yesterday, improved aeration in the LEFT lung base without residual consolidation. Prominent bronchovascular markings diffusely and moderate to marked central peribronchial thickening, more so than on prior examinations. Normal pulmonary vascularity. No visible pleural effusions. Osseous demineralization. Degenerative changes throughout the thoracic spine. Cervicothoracic levoscoliosis and thoracolumbar dextroscoliosis is noted previously. IMPRESSION: 1. Improved aeration in the LEFT lung base without convincing evidence of residual consolidation. 2. Moderate to severe changes of acute bronchitis and/or asthma without focal airspace pneumonia. 3. Stable cardiomegaly without pulmonary edema. Electronically Signed   By: Evangeline Dakin M.D.   On: 03/29/2018 14:23   Dg Chest Port 1 View  Result Date: 03/28/2018 CLINICAL DATA:  Shortness of breath for 2 days EXAM: PORTABLE  CHEST 1 VIEW COMPARISON:  February 15, 2018 FINDINGS: The heart size and mediastinal contours are stable. Heart size is enlarged. There is mild patchy opacity of the medial left lung base. There is no pulmonary edema or pleural effusion. The visualized skeletal structures are stable. IMPRESSION: Mild patchy opacity of the medial left lung base, developing pneumonia is not excluded. Electronically Signed   By: Abelardo Diesel M.D.   On: 03/28/2018 21:12    Assessment and Plan:   1.  New onset rapid atrial fibrillation: This is likely the etiology for recurrent CVA.  She is currently on aspirin and Plavix.  She may be suitable for apixaban alone for systemic anticoagulation but I would recommend consulting with neurology first to see if they recommend aspirin along with apixaban.  Heart rate is elevated and she is currently on carvedilol 25 mg twice daily (had been on 12.5 mg bid at home) with IV Lopressor being used as needed.  Avoid calcium channel blockers given left ventricular systolic dysfunction.  Consider IV amiodarone if heart rates do not come down.  2.  Acute on chronic combined systolic and diastolic heart failure: She received 1 dose of IV Lasix 40 mg and is ordered to receive 40 mg daily.  She is net -1200 cc thus far. Currently appears euvolemic. Likely triggered by rapid atrial fibrillation.  She is on carvedilol.  She had been on BiDil earlier this fall.  She developed angioedema with lisinopril and thus ACE inhibitors and angiotensin receptor blockers are being avoided.  3.  History of recurrent CVA: Currently on aspirin, Plavix, and atorvastatin.  Given the development of atrial fibrillation, she may be suitable for apixaban alone for systemic anticoagulation but I would recommend consulting  with neurology first to see if they recommend aspirin along with apixaban.   4.  Hypertension: Diastolic blood pressure is mildly elevated.  This will need further monitoring given IV diuretic  requirement.  5.  Hyperlipidemia: Continue atorvastatin.  6.  Troponin elevation: Consistent with demand ischemia in the context of rapid atrial fibrillation and decompensated heart failure.  Mild nonobstructive disease by cardiac catheterization earlier this year.   For questions or updates, please contact [place] Please consult [email or web address] for contact info under     Signed, Brandy Sable, MD  03/29/2018 2:51 PM

## 2018-03-29 NOTE — ED Notes (Signed)
Florene Glen, MD at bedside

## 2018-03-29 NOTE — Progress Notes (Signed)
Patient's current vitals are as follows:    03/29/18 1609  Vitals  BP (!) 132/110  MAP (mmHg) 116  BP Method Automatic  Pulse Rate (!) 143  Pulse Rate Source Monitor  Oxygen Therapy  SpO2 100 %   Spoke to Koneswarans,S MD cardiologist regarding metoprolol injection 5 mg about administering 2.5mg  first and check vitals after 30 minutes, Doctor verbally agreed.

## 2018-03-29 NOTE — ED Notes (Signed)
Ordered breakfast tray, diet soft

## 2018-03-29 NOTE — H&P (Addendum)
History and Physical    [name] EHM:[id number] DOB: 1940/11/15 DOA: 03/28/2018  PCP: [name], MD Patient coming from: SNF  I have personally briefly reviewed patient's old medical records in [name]  Chief Complaint: Shortness of breath  HPI: Brandy [name] is a 78 y.o. female with medical history significant for combined systolic and diastolic heart failure (last EF 40%), multiple CVA with most recent being right MCA stroke in November of 2019 with residual left-sided hemiparesis, undocumented COPD and hypertension who presented to the ED from her skilled nursing facility with shortness of breath x2 days and nonproductive cough.  She has been at a skilled nursing facility since her discharge from the hospital after a large territory right MCA stroke in November 2019, she was discharged on December 18 to a SNF.  She reports shortness of breath at rest and with exertion.  Patient denies orthopnea or PND.  No wheezing or stridor.  Denies episodes of frank aspiration.  Her daughter states that she has slightly increased lower extremity edema from baseline.  The patient denies fevers, denies chills, denies chest pain or palpitations.  She denies abdominal pain, nausea, vomiting, urinary changes.  No direct sick contacts, however patient is currently at [facility].  She endorses compliance with her heart failure regimen of aspirin, Lipitor, Coreg, Plavix.  Since discharge, she has been taking Ritalin. Of note, patient had LHC in March of 2019 showing no significant atherosclerotic disease.  ED Course: In the ED, patient afebrile, mildly tachycardic, mildly tachypneic, grossly normotensive and saturating comfortably on room air in the mid to upper 90s.  Labs are notable for WBC 8.4, Hgb 11.4, normal renal function, normal LFTs.  BNP elevated at 776 (was previously 369), initial troponin 0 0.31.  EKG demonstrated incomplete right bundle branch block with a rate of 112 and Q waves in 1 2 and  aVL.  Most recent echocardiogram from November 2019 demonstrated an EF of 40% with diffuse hypokinesis, moderate left ventricular hypertrophy and diastolic dysfunction.  Chest x-ray demonstrated cardiomegaly with mild opacity at the right base possibly representing a developing pneumonia.  Patient given a dose of Levaquin and Lasix while in the ED. Cardiology consulted given elevated troponin.  Review of Systems: As per HPI otherwise 10 point review of systems negative.   Past Medical History:  Diagnosis Date  . Cardiomyopathy    a. EF of 25% in 07/2006 b. EF normalized by repeat echo in 2011 c. EF 35-40% by echo in 05/2017 with cath showing mild nonobstructive CAD  . Chronic combined systolic and diastolic CHF (congestive heart failure) ([employer])   . Colon polyps   . Diverticulosis of colon   . Hypertension   . Hypertensive heart disease   . Osteopenia   . TIA (transient ischemic attack)   . Tobacco abuse    50 pack year    Past Surgical History:  Procedure Laterality Date  . BREAST BIOPSY    . CARPAL TUNNEL RELEASE     right hand  . CERVICAL FUSION    . CESAREAN SECTION     2 times  . COLONOSCOPY  3662   pt uncertain as to whether polypectomy required or performed  . ECTOPIC PREGNANCY SURGERY    . IR ANGIO VERTEBRAL SEL SUBCLAVIAN INNOMINATE UNI R MOD SED  02/10/2018  . IR CT HEAD LTD  02/10/2018  . IR PERCUTANEOUS ART THROMBECTOMY/INFUSION INTRACRANIAL INC DIAG ANGIO  02/10/2018  . RADIOLOGY WITH ANESTHESIA N/A 02/10/2018  Procedure: RADIOLOGY WITH ANESTHESIA;  Surgeon: Luanne Bras, MD;  Location: [facility];  Service: Radiology;  Laterality: N/A;  . RIGHT/LEFT HEART CATH AND CORONARY ANGIOGRAPHY N/A 06/03/2017   Procedure: RIGHT/LEFT HEART CATH AND CORONARY ANGIOGRAPHY;  Surgeon: Jettie Booze, MD;  Location: [facility];  Service: Cardiovascular;  Laterality: N/A;  . TEE WITHOUT CARDIOVERSION N/A 02/13/2018   Procedure: TRANSESOPHAGEAL ECHOCARDIOGRAM (TEE);   Surgeon: Sueanne Margarita, MD;  Location: [facility];  Service: Cardiovascular;  Laterality: N/A;  . TONSILLECTOMY       reports that she quit smoking about 10 years ago. Her smoking use included cigarettes. She started smoking about 57 years ago. She has a 50.00 pack-year smoking history. She has never used smokeless tobacco. She reports that she does not drink alcohol or use drugs.  Allergies  Allergen Reactions  . Lisinopril Swelling    Family History  Problem Relation Age of Onset  . Hypertension Brother   . Hypertension Maternal Grandmother   . Cancer Paternal Grandmother     Prior to Admission medications   Medication Sig Start Date End Date Taking? Authorizing Provider  acetaminophen (TYLENOL) 325 MG tablet Take 2 tablets (650 mg total) by mouth every 4 (four) hours as needed for mild pain (or temp > 37.5 C (99.5 F)). Patient taking differently: Take 650 mg by mouth 4 (four) times daily.  03/16/18  Yes Angiulli, Lavon Paganini, PA-C  aspirin 325 MG tablet Take 1 tablet (325 mg total) by mouth daily. 02/17/18  Yes Vonzella Nipple, NP  atorvastatin (LIPITOR) 10 MG tablet Take 1 tablet (10 mg total) by mouth daily at 6 PM. 02/16/18  Yes Vonzella Nipple, NP  carvedilol (COREG) 12.5 MG tablet Take 1 tablet (12.5 mg total) by mouth 2 (two) times daily with a meal. 08/25/17  Yes Herminio Commons, MD  clopidogrel (PLAVIX) 75 MG tablet Take 1 tablet (75 mg total) by mouth daily. 02/17/18  Yes Vonzella Nipple, NP  donepezil (ARICEPT) 5 MG tablet Take 1 tablet by mouth at bedtime. 11/15/17  Yes [provider]  methylphenidate (RITALIN) 5 MG tablet Take 1 tablet (5 mg total) by mouth 2 (two) times daily with breakfast and lunch. 03/16/18  Yes Angiulli, Lavon Paganini, PA-C  traMADol (ULTRAM) 50 MG tablet Take 50 mg by mouth 2 (two) times daily.   Yes [provider]  umeclidinium-vilanterol (ANORO ELLIPTA) 62.5-25 MCG/INH AEPB Inhale 1 puff into the lungs daily. 03/16/18   Yes Cathlyn Parsons, PA-C    Physical Exam: Vitals:   03/28/18 2012 03/28/18 2015 03/28/18 2315 03/28/18 2349  BP:   138/85 (!) 147/86  Pulse:   96 (!) 101  Resp:   (!) 29 (!) 22  Temp:  98.2 F (36.8 C)    TempSrc:  Oral    SpO2:   94% 96%  Weight: 85.7 kg     Height: 5\' 4"  (1.626 m)       Constitutional: NAD, calm, comfortable Eyes: PERRL, lids and conjunctivae normal ENMT: Mucous membranes are moist. Posterior pharynx clear of any exudate or lesions.Normal dentition.  Neck: normal, supple, no masses, no thyromegaly Respiratory: clear to auscultation bilaterally, no wheezing, no crackles. Normal respiratory effort. Cardiovascular: tachycardic, no murmurs / rubs / gallops. Trace edema of BLE. 2+ pedal pulses. Abdomen: no tenderness, no masses palpated.  Bowel sounds positive.  Musculoskeletal: no clubbing / cyanosis. Skin: no rashes, lesions, ulcers. No induration Neurologic: Left facial droop. Sensation decreased on left.  Strength 5/5 on right, 1/5 on left hemibody.  Psychiatric: Normal judgment and insight. Alert and oriented x 3. Normal mood.   Labs on Admission: I have personally reviewed following labs and imaging studies  CBC: Recent Labs  Lab 03/28/18 2018  WBC 8.4  NEUTROABS 6.1  HGB 11.4*  HCT 36.5  MCV 92.6  PLT 099   Basic Metabolic Panel: Recent Labs  Lab 03/28/18 2018  NA 144  K 3.9  CL 111  CO2 22  GLUCOSE 106*  BUN 26*  CREATININE 1.09*  CALCIUM 9.4   GFR: Estimated Creatinine Clearance: 47.2 mL/min (A) (by C-G formula based on SCr of 1.09 mg/dL (H)). Liver Function Tests: Recent Labs  Lab 03/28/18 2018  AST 21  ALT 12  ALKPHOS 41  BILITOT 0.2*  PROT 6.7  ALBUMIN 2.8*   No results for input(s): LIPASE, AMYLASE in the last 168 hours. No results for input(s): AMMONIA in the last 168 hours. Coagulation Profile: No results for input(s): INR, PROTIME in the last 168 hours. Cardiac Enzymes: Recent Labs  Lab 03/28/18 2018    TROPONINI 0.31*   BNP (last 3 results) No results for input(s): PROBNP in the last 8760 hours. HbA1C: No results for input(s): HGBA1C in the last 72 hours. CBG: No results for input(s): GLUCAP in the last 168 hours. Lipid Profile: No results for input(s): CHOL, HDL, LDLCALC, TRIG, CHOLHDL, LDLDIRECT in the last 72 hours. Thyroid Function Tests: No results for input(s): TSH, T4TOTAL, FREET4, T3FREE, THYROIDAB in the last 72 hours. Anemia Panel: No results for input(s): VITAMINB12, FOLATE, FERRITIN, TIBC, IRON, RETICCTPCT in the last 72 hours. Urine analysis:    Component Value Date/Time   COLORURINE YELLOW 02/14/2018 1106   APPEARANCEUR CLEAR 02/14/2018 1106   LABSPEC 1.019 02/14/2018 1106   PHURINE 5.0 02/14/2018 1106   GLUCOSEU NEGATIVE 02/14/2018 1106   HGBUR NEGATIVE 02/14/2018 1106   BILIRUBINUR NEGATIVE 02/14/2018 1106   KETONESUR 80 (A) 02/14/2018 1106   PROTEINUR NEGATIVE 02/14/2018 1106   NITRITE NEGATIVE 02/14/2018 1106   LEUKOCYTESUR NEGATIVE 02/14/2018 1106    Radiological Exams on Admission: Dg Chest Port 1 View  Result Date: 03/28/2018 CLINICAL DATA:  Shortness of breath for 2 days EXAM: PORTABLE CHEST 1 VIEW COMPARISON:  February 15, 2018 FINDINGS: The heart size and mediastinal contours are stable. Heart size is enlarged. There is mild patchy opacity of the medial left lung base. There is no pulmonary edema or pleural effusion. The visualized skeletal structures are stable. IMPRESSION: Mild patchy opacity of the medial left lung base, developing pneumonia is not excluded. Electronically Signed   By: Abelardo Diesel M.D.   On: 03/28/2018 21:12    EKG: Independently reviewed. Incomplete RBBB. Rate 112, sinus tachycardia. Q waves in I, II, aVL.  Assessment/Plan Active Problems:   Acute exacerbation of congestive heart failure (HCC)  Shortness of breath with elevated BNP and troponin with known history of combined systolic and diastolic heart failure is  consistent with heart failure exacerbation. Her exam and imaging is rather unimpressive for volume overload, however. Her troponin elevation is unlikely to be related to NSTEMI given her lack of atherosclerotic coronary disease noted on her recent LHC in March 2019. Certainly, a developing pneumonia is of concern with the imaging findings showing a left basilar opacity and cough, although it is more probable that her symptoms are best explained by heart failure exacerbation. She is not on Lasix at baseline which is a likely contributing factor.  1. Acute exacerbation  of combined systolic and diastolic heart failure 2. Troponin elevation - Cardiology consulted - Diurese with IV Lasix - Continue ASA, Plavix, Lipitor, Coreg - Obtain TTE - Trend troponins - Monitor on telemetry - NPO for possible ischemic eval in AM - Trend electrolytes, goal K>4, Mg>2 - Stop Ritalin as this can worsen EF in this age group  3. Possible community acquired pneumonia - Continue antibiotics for now for CAP - Check RVP - Can stop if symptoms of cough and shortness of breath improving with diuresis  4. Undocumented COPD not in acute exacerbation - Stop Anoro ellipta as pt unable to generate appropriate PIF given recent stroke - Transition to Brovana BID, Duo-Nebs TID - SpO2 goal 88-92% - Incentive spirometry - RT eval and treat  5. Recent right MCA CVA - Continue ASA, Plavix, statin - Continue PT while hospitalized - Continue Aricept - Stop Ritalin per above  DVT prophylaxis: Lovenox Code Status: Full Family Communication: Husband, Dudley Cooley Disposition Plan: SNF in 1-2 days Consults called: Cardiology Admission status: Inpatient telemetry    Sharene Butters MD [facility]  If 7PM-7AM, please contact night-coverage [email or web address] Password [facility]  03/29/2018, 12:37 AM

## 2018-03-29 NOTE — Progress Notes (Addendum)
PROGRESS NOTE    [name]  ATF:[id number] DOB: 04-22-1940 DOA: 03/28/2018 PCP: [name], MD   Brief Narrative:  Brandy [name] is [name] 78 y.o. female with medical history significant for combined systolic and diastolic heart failure (last EF 40%), multiple CVA with most recent being right MCA stroke in November of 2019 with residual left-sided hemiparesis, undocumented COPD and hypertension who presented to the ED from her skilled nursing facility with shortness of breath x2 days and nonproductive cough.  She has been at [name] skilled nursing facility since her discharge from the hospital after [name] large territory right MCA stroke in November 2019, she was discharged on December 18 to [name].  She reports shortness of breath at rest and with exertion.  Patient denies orthopnea or PND.  No wheezing or stridor.  Denies episodes of frank aspiration.  Her daughter states that she has slightly increased lower extremity edema from baseline.  The patient denies fevers, denies chills, denies chest pain or palpitations.  She denies abdominal pain, nausea, vomiting, urinary changes.  No direct sick contacts, however patient is currently at [facility].  She endorses compliance with her heart failure regimen of aspirin, Lipitor, Coreg, Plavix.  Since discharge, she has been taking Ritalin. Of note, patient had LHC in March of 2019 showing no significant atherosclerotic disease.  Assessment & Plan:   Active Problems:   Acute exacerbation of congestive heart failure ([place])  # Atrial Fibrillation with RVR: Chadvasc probably at least 7.  Discussed anticoagulation strategy with cardiology and discussed with neurology (Dr. Erlinda Hong) as well given recent stroke on aspirin/plavix.  Neurology ok with discontinuing antiplatelets and starting eliquis alone. Increase coreg to 25 mg BID, continue to increase as tolerated Metop 5 mg IV prn Appreciate cardiology recs  1. Acute exacerbation of combined systolic and diastolic heart  failure 2. Troponin elevation - Cardiology consulted, appreciate recs - Diurese with IV Lasix - Continue ASA, Plavix, Lipitor, Coreg - Obtain TTE (pending - TEE from 01/2018 with EF 30-35% with moderate diffuse hypokinesis) - Troponins are flat, but elevated to 0.31, no si/sx ACS, likely component of demand with above - BNP 776, elevated, being diuresed - Monitor on telemetry - Trend electrolytes, goal K>4, Mg>2 - Stop Ritalin as this can worsen EF in this age group  3. Possible community acquired pneumonia  Shortness of Breath - Repeat 2 view CXR with improved aeration, "without convincing evidence of residual consolidation".  Findings c/w acute bronchitis vs asthma were noted, but pt without wheezing.  Will stop abx and continue to monitor.  No steroids without wheezing, but continue nebs (as noted below). - Check RVP (negative)  4. Undocumented COPD not in acute exacerbation - Stop Anoro ellipta as pt unable to generate appropriate PIF given recent stroke - Transition to Brovana BID, Duo-Nebs TID - SpO2 goal 88-92% - Incentive spirometry - RT eval and treat  5. Recent right MCA CVA - Stop ASA/plavix.  Start eliquis.  Continue statin. - Continue PT while hospitalized - Continue Aricept - Stop Ritalin per above  # Hyperkalemia: mild, continue to follow  # Pyuria: UA dirty with large LE, many bacteria, >50 WBC, 6-10 RBC.  She denies sx of UTI.  Continue to monitor, consider culture and treatment if pt becomes symptomatic.  Would repeat UA for microscopic hematuria.  # concern for aspiration: likely related to prior stroke, daughter concerned about aspiration.  Most recent speech notes I see show that she was on regular diet with  thin liquids. Will c/s speech at this time for repeat evaluation.  DVT prophylaxis: eliquis Code Status: full  Family Communication: husband at bedside Disposition Plan: pending further improvement in heart rate and respiratory  status   Consultants:   cardiology  Procedures:  Pending echo   Antimicrobials:  Anti-infectives (From admission, onward)   Start     Dose/Rate Route Frequency Ordered Stop   03/31/18 1000  azithromycin (ZITHROMAX) tablet 250 mg  Status:  Discontinued     250 mg Oral Daily 03/29/18 0037 03/29/18 1616   03/30/18 0600  azithromycin (ZITHROMAX) tablet 500 mg  Status:  Discontinued     500 mg Oral Daily 03/29/18 0037 03/29/18 1616   03/29/18 0600  cefTRIAXone (ROCEPHIN) 1 g in sodium chloride 0.9 % 100 mL IVPB  Status:  Discontinued     1 g 200 mL/hr over 30 Minutes Intravenous Every 24 hours 03/29/18 0037 03/29/18 1616   03/28/18 2230  levofloxacin (LEVAQUIN) IVPB 500 mg     500 mg 100 mL/hr over 60 Minutes Intravenous  Once 03/28/18 2227 03/29/18 0058     Subjective: Shortness of breath improved. Presented for not feeling well, genearlly.  SOB.  Denies CP, fevers, chills, UTI sx. Residual L sided weakness from stroke.  Objective: Vitals:   03/29/18 1142 03/29/18 1349 03/29/18 1400 03/29/18 1508  BP: 129/87 122/85 (!) 123/98 (!) 113/94  Pulse: (!) 122 (!) 130 (!) 129 (!) 155  Resp: 18 18    Temp:    97.6 F (36.4 C)  TempSrc:    Oral  SpO2: 95% 95% 99% 91%  Weight:      Height:        Intake/Output Summary (Last 24 hours) at 03/29/2018 1602 Last data filed at 03/29/2018 0858 Gross per 24 hour  Intake 299.87 ml  Output 1200 ml  Net -900.13 ml   Filed Weights   03/28/18 2012  Weight: 85.7 kg    Examination:  General exam: Appears calm and comfortable  Respiratory system: Clear to auscultation. Respiratory effort normal. Cardiovascular system: tachycardic, irregular Gastrointestinal system: Abdomen is nondistended, soft and nontender. Central nervous system: Alert and oriented. L sided weakness. Extremities: No LEE.  Skin: No rashes, lesions or ulcers Psychiatry: Judgement and insight appear normal. Mood & affect appropriate.     Data Reviewed: I have  personally reviewed following labs and imaging studies  CBC: Recent Labs  Lab 03/28/18 2018  WBC 8.4  NEUTROABS 6.1  HGB 11.4*  HCT 36.5  MCV 92.6  PLT 102   Basic Metabolic Panel: Recent Labs  Lab 03/28/18 2018 03/29/18 0420  NA 144 144  K 3.9 5.2*  CL 111 112*  CO2 22 21*  GLUCOSE 106* 84  BUN 26* 25*  CREATININE 1.09* 0.92  CALCIUM 9.4 9.0  MG  --  1.9   GFR: Estimated Creatinine Clearance: 56 mL/min (by C-G formula based on SCr of 0.92 mg/dL). Liver Function Tests: Recent Labs  Lab 03/28/18 2018  AST 21  ALT 12  ALKPHOS 41  BILITOT 0.2*  PROT 6.7  ALBUMIN 2.8*   No results for input(s): LIPASE, AMYLASE in the last 168 hours. No results for input(s): AMMONIA in the last 168 hours. Coagulation Profile: Recent Labs  Lab 03/29/18 0420  INR 1.35   Cardiac Enzymes: Recent Labs  Lab 03/28/18 2018 03/29/18 0420 03/29/18 1004  TROPONINI 0.31* 0.27* 0.31*   BNP (last 3 results) No results for input(s): PROBNP in the last 8760 hours. HbA1C:  No results for input(s): HGBA1C in the last 72 hours. CBG: No results for input(s): GLUCAP in the last 168 hours. Lipid Profile: No results for input(s): CHOL, HDL, LDLCALC, TRIG, CHOLHDL, LDLDIRECT in the last 72 hours. Thyroid Function Tests: Recent Labs    03/29/18 0421  TSH 1.033   Anemia Panel: No results for input(s): VITAMINB12, FOLATE, FERRITIN, TIBC, IRON, RETICCTPCT in the last 72 hours. Sepsis Labs: No results for input(s): PROCALCITON, LATICACIDVEN in the last 168 hours.  Recent Results (from the past 240 hour(s))  Respiratory Panel by PCR     Status: None   Collection Time: 03/29/18 12:55 AM  Result Value Ref Range Status   Adenovirus NOT DETECTED NOT DETECTED Final   Coronavirus 229E NOT DETECTED NOT DETECTED Final   Coronavirus HKU1 NOT DETECTED NOT DETECTED Final   Coronavirus NL63 NOT DETECTED NOT DETECTED Final   Coronavirus OC43 NOT DETECTED NOT DETECTED Final   Metapneumovirus NOT  DETECTED NOT DETECTED Final   Rhinovirus / Enterovirus NOT DETECTED NOT DETECTED Final   Influenza [place]isro[place] NOT DETECTED NOT DETECTED Final   Influenza B NOT DETECTED NOT DETECTED Final   Parainfluenza Virus 1 NOT DETECTED NOT DETECTED Final   Parainfluenza Virus 2 NOT DETECTED NOT DETECTED Final   Parainfluenza Virus 3 NOT DETECTED NOT DETECTED Final   Parainfluenza Virus 4 NOT DETECTED NOT DETECTED Final   Respiratory Syncytial Virus NOT DETECTED NOT DETECTED Final   Bordetella pertussis NOT DETECTED NOT DETECTED Final   Chlamydophila pneumoniae NOT DETECTED NOT DETECTED Final   Mycoplasma pneumoniae NOT DETECTED NOT DETECTED Final    Comment: Performed at [facility], [address] [place], [place] [address]         Radiology Studies: Dg Chest 2 View  Result Date: 03/29/2018 CLINICAL DATA:  78 year old who had acute RIGHT MIDDLE cerebral artery occlusion with endovascular revascularization 02/10/2018. Follow-up possible LEFT lung base pneumonia. EXAM: CHEST - 2 VIEW COMPARISON:  03/28/2018, 02/15/2018 and earlier. FINDINGS: AP ERECT and LATERAL images were obtained. The patient was unable to raise the LEFT arm due to her prior stroke, partially obscuring the LATERAL image. Cardiac silhouette moderately enlarged, unchanged. Thoracic aorta mildly atherosclerotic, unchanged. Hilar and mediastinal contours otherwise unremarkable. Since the examination yesterday, improved aeration in the LEFT lung base without residual consolidation. Prominent bronchovascular markings diffusely and moderate to marked central peribronchial thickening, more so than on prior examinations. Normal pulmonary vascularity. No visible pleural effusions. Osseous demineralization. Degenerative changes throughout the thoracic spine. Cervicothoracic levoscoliosis and thoracolumbar dextroscoliosis is noted previously. IMPRESSION: 1. Improved aeration in the LEFT lung base without convincing evidence of residual consolidation.  2. Moderate to severe changes of acute bronchitis and/or asthma without focal airspace pneumonia. 3. Stable cardiomegaly without pulmonary edema. Electronically Signed   By: Evangeline Dakin M.D.   On: 03/29/2018 14:23   Dg Chest Port 1 View  Result Date: 03/28/2018 CLINICAL DATA:  Shortness of breath for 2 days EXAM: PORTABLE CHEST 1 VIEW COMPARISON:  February 15, 2018 FINDINGS: The heart size and mediastinal contours are stable. Heart size is enlarged. There is mild patchy opacity of the medial left lung base. There is no pulmonary edema or pleural effusion. The visualized skeletal structures are stable. IMPRESSION: Mild patchy opacity of the medial left lung base, developing pneumonia is not excluded. Electronically Signed   By: Abelardo Diesel M.D.   On: 03/28/2018 21:12        Scheduled Meds: . apixaban  5 mg Oral  BID  . arformoterol  15 mcg Nebulization BID  . atorvastatin  40 mg Oral q1800  . [START ON 03/30/2018] azithromycin  500 mg Oral Daily   Followed by  . [START ON 03/31/2018] azithromycin  250 mg Oral Daily  . carvedilol  25 mg Oral BID WC  . donepezil  5 mg Oral QHS  . [START ON 03/30/2018] furosemide  40 mg Intravenous Daily  . ipratropium-albuterol  3 mL Nebulization BID  . metoprolol tartrate  5 mg Intravenous Once  . sodium chloride flush  3 mL Intravenous Q12H   Continuous Infusions: . sodium chloride    . cefTRIAXone (ROCEPHIN)  IV Stopped (03/29/18 0558)     LOS: 1 day    Time spent: over 30 min    Fayrene Helper, MD [facility] Pager [phone]  If 7PM-7AM, please contact night-coverage [email or web address] Password [email or web address] 03/29/2018, 4:02 PM

## 2018-03-30 ENCOUNTER — Encounter (HOSPITAL_COMMUNITY): Payer: Self-pay | Admitting: Nurse Practitioner

## 2018-03-30 ENCOUNTER — Inpatient Hospital Stay (HOSPITAL_COMMUNITY): Payer: Medicare Other

## 2018-03-30 DIAGNOSIS — I420 Dilated cardiomyopathy: Secondary | ICD-10-CM

## 2018-03-30 DIAGNOSIS — R7989 Other specified abnormal findings of blood chemistry: Secondary | ICD-10-CM

## 2018-03-30 DIAGNOSIS — I361 Nonrheumatic tricuspid (valve) insufficiency: Secondary | ICD-10-CM

## 2018-03-30 DIAGNOSIS — I5021 Acute systolic (congestive) heart failure: Secondary | ICD-10-CM

## 2018-03-30 DIAGNOSIS — R0602 Shortness of breath: Secondary | ICD-10-CM

## 2018-03-30 DIAGNOSIS — I480 Paroxysmal atrial fibrillation: Secondary | ICD-10-CM

## 2018-03-30 DIAGNOSIS — I340 Nonrheumatic mitral (valve) insufficiency: Secondary | ICD-10-CM

## 2018-03-30 LAB — ECHOCARDIOGRAM COMPLETE
Height: 64 in
WEIGHTICAEL: 2458.57 [oz_av]

## 2018-03-30 LAB — COMPREHENSIVE METABOLIC PANEL
ALT: 14 U/L (ref 0–44)
AST: 17 U/L (ref 15–41)
Albumin: 2.4 g/dL — ABNORMAL LOW (ref 3.5–5.0)
Alkaline Phosphatase: 38 U/L (ref 38–126)
Anion gap: 9 (ref 5–15)
BUN: 27 mg/dL — ABNORMAL HIGH (ref 8–23)
CO2: 24 mmol/L (ref 22–32)
Calcium: 9 mg/dL (ref 8.9–10.3)
Chloride: 110 mmol/L (ref 98–111)
Creatinine, Ser: 1.11 mg/dL — ABNORMAL HIGH (ref 0.44–1.00)
GFR calc non Af Amer: 49 mL/min — ABNORMAL LOW (ref >60)
GFR, EST AFRICAN AMERICAN: 56 mL/min — AB (ref >60)
Glucose, Bld: 92 mg/dL (ref 70–99)
Potassium: 3.5 mmol/L (ref 3.5–5.1)
Sodium: 143 mmol/L (ref 135–145)
TOTAL PROTEIN: 5.8 g/dL — AB (ref 6.5–8.1)
Total Bilirubin: 0.4 mg/dL (ref 0.3–1.2)

## 2018-03-30 LAB — CBC
HCT: 28.6 % — ABNORMAL LOW (ref 36.0–46.0)
Hemoglobin: 8.8 g/dL — ABNORMAL LOW (ref 12.0–15.0)
MCH: 27.6 pg (ref 26.0–34.0)
MCHC: 30.8 g/dL (ref 30.0–36.0)
MCV: 89.7 fL (ref 80.0–100.0)
Platelets: 212 10*3/uL (ref 150–400)
RBC: 3.19 MIL/uL — ABNORMAL LOW (ref 3.87–5.11)
RDW: 15 % (ref 11.5–15.5)
WBC: 6.5 10*3/uL (ref 4.0–10.5)
nRBC: 0 % (ref 0.0–0.2)

## 2018-03-30 LAB — MAGNESIUM: MAGNESIUM: 1.7 mg/dL (ref 1.7–2.4)

## 2018-03-30 MED ORDER — POTASSIUM CHLORIDE CRYS ER 20 MEQ PO TBCR
60.0000 meq | EXTENDED_RELEASE_TABLET | Freq: Once | ORAL | Status: AC
  Administered 2018-03-30: 60 meq via ORAL
  Filled 2018-03-30: qty 3

## 2018-03-30 MED ORDER — FUROSEMIDE 40 MG PO TABS
40.0000 mg | ORAL_TABLET | Freq: Two times a day (BID) | ORAL | Status: DC
  Administered 2018-03-30 – 2018-04-04 (×10): 40 mg via ORAL
  Filled 2018-03-30 (×10): qty 1

## 2018-03-30 MED ORDER — MAGNESIUM SULFATE 2 GM/50ML IV SOLN
2.0000 g | Freq: Once | INTRAVENOUS | Status: AC
  Administered 2018-03-30: 2 g via INTRAVENOUS
  Filled 2018-03-30: qty 50

## 2018-03-30 MED ORDER — ISOSORBIDE MONONITRATE ER 30 MG PO TB24
15.0000 mg | ORAL_TABLET | Freq: Every day | ORAL | Status: DC
  Administered 2018-03-30 – 2018-04-04 (×6): 15 mg via ORAL
  Filled 2018-03-30 (×6): qty 1

## 2018-03-30 MED ORDER — HYDRALAZINE HCL 10 MG PO TABS
10.0000 mg | ORAL_TABLET | Freq: Three times a day (TID) | ORAL | Status: DC
  Administered 2018-03-30 – 2018-04-01 (×6): 10 mg via ORAL
  Filled 2018-03-30 (×6): qty 1

## 2018-03-30 NOTE — Care Management Note (Signed)
Case Management Note  Patient Details  Name: Brandy [name] MRN: [id number] Date of Birth: February 05, 1941  Subjective/Objective:   CHF                Action/Plan: Patient resides in a nursing facility prior to admission. SW following to transition back to facility when medically stable; CM will continue to follow for progression of care.  Expected Discharge Date:    possibly 04/02/2018              Expected Discharge Plan:  [place]  In-House Referral:   SW  Discharge planning Services  CM Consult  Status of Service:  In process, will continue to follow  Sherrilyn Rist [phone] 03/30/2018, 9:36 AM

## 2018-03-30 NOTE — Discharge Instructions (Signed)
Information on my medicine - ELIQUIS (apixaban)  This medication education was reviewed with me or my healthcare representative as part of my discharge preparation.  The pharmacist that spoke with me during my hospital stay was:  Rhyli Depaula Prescott, RPH  Why was Eliquis prescribed for you? Eliquis was prescribed for you to reduce the risk of a blood clot forming that can cause a stroke if you have a medical condition called atrial fibrillation (a type of irregular heartbeat).  What do You need to know about Eliquis ? Take your Eliquis TWICE DAILY - one tablet in the morning and one tablet in the evening with or without food. If you have difficulty swallowing the tablet whole please discuss with your pharmacist how to take the medication safely.  Take Eliquis exactly as prescribed by your doctor and DO NOT stop taking Eliquis without talking to the doctor who prescribed the medication.  Stopping may increase your risk of developing a stroke.  Refill your prescription before you run out.  After discharge, you should have regular check-up appointments with your healthcare provider that is prescribing your Eliquis.  In the future your dose may need to be changed if your kidney function or weight changes by a significant amount or as you get older.  What do you do if you miss a dose? If you miss a dose, take it as soon as you remember on the same day and resume taking twice daily.  Do not take more than one dose of ELIQUIS at the same time to make up a missed dose.  Important Safety Information A possible side effect of Eliquis is bleeding. You should call your healthcare provider right away if you experience any of the following: ? Bleeding from an injury or your nose that does not stop. ? Unusual colored urine (red or dark brown) or unusual colored stools (red or black). ? Unusual bruising for unknown reasons. ? A serious fall or if you hit your head (even if there is no  bleeding).  Some medicines may interact with Eliquis and might increase your risk of bleeding or clotting while on Eliquis. To help avoid this, consult your healthcare provider or pharmacist prior to using any new prescription or non-prescription medications, including herbals, vitamins, non-steroidal anti-inflammatory drugs (NSAIDs) and supplements.  This website has more information on Eliquis (apixaban): http://www.eliquis.com/eliquis/home 

## 2018-03-30 NOTE — Progress Notes (Signed)
   Notified by RN that patient had a 12 beat run of NSVT. Asymptomatic and sitting in a chair at the time. Labs reviewed - K 3.5 and Mg 1.7 today. Will replete both and continue to monitor on telemetry.   Abigail Butts, PA-C 03/30/18

## 2018-03-30 NOTE — Progress Notes (Signed)
Patient alarmed 12 beats v-tach. Patient asymptomatic and sitting in the chair. Daleen Snook, NP made aware. Will continue to monitor.

## 2018-03-30 NOTE — Evaluation (Signed)
Clinical/Bedside Swallow Evaluation Patient Details  Name: MALISSIA RABBANI MRN: [id number] Date of Birth: 1940-04-10  Today's Date: 03/30/2018 Time: SLP Start Time (ACUTE ONLY): 0907 SLP Stop Time (ACUTE ONLY): 0948 SLP Time Calculation (min) (ACUTE ONLY): 41 min  Past Medical History:  Past Medical History:  Diagnosis Date  . Cardiomyopathy    a. EF of 25% in 07/2006 b. EF normalized by repeat echo in 2011 c. EF 35-40% by echo in 05/2017 with cath showing mild nonobstructive CAD  . Chronic combined systolic and diastolic CHF (congestive heart failure) ([place])   . Colon polyps   . Diverticulosis of colon   . Hypertension   . Hypertensive heart disease   . Osteopenia   . Stroke ([place])   . TIA (transient ischemic attack)   . Tobacco abuse    50 pack year   Past Surgical History:  Past Surgical History:  Procedure Laterality Date  . BREAST BIOPSY    . CARPAL TUNNEL RELEASE     right hand  . CERVICAL FUSION    . CESAREAN SECTION     2 times  . COLONOSCOPY  1950   pt uncertain as to whether polypectomy required or performed  . ECTOPIC PREGNANCY SURGERY    . IR ANGIO VERTEBRAL SEL SUBCLAVIAN INNOMINATE UNI R MOD SED  02/10/2018  . IR CT HEAD LTD  02/10/2018  . IR PERCUTANEOUS ART THROMBECTOMY/INFUSION INTRACRANIAL INC DIAG ANGIO  02/10/2018  . RADIOLOGY WITH ANESTHESIA N/A 02/10/2018   Procedure: RADIOLOGY WITH ANESTHESIA;  Surgeon: Luanne Bras, MD;  Location: [facility];  Service: Radiology;  Laterality: N/A;  . RIGHT/LEFT HEART CATH AND CORONARY ANGIOGRAPHY N/A 06/03/2017   Procedure: RIGHT/LEFT HEART CATH AND CORONARY ANGIOGRAPHY;  Surgeon: Jettie Booze, MD;  Location: [facility];  Service: Cardiovascular;  Laterality: N/A;  . TEE WITHOUT CARDIOVERSION N/A 02/13/2018   Procedure: TRANSESOPHAGEAL ECHOCARDIOGRAM (TEE);  Surgeon: Sueanne Margarita, MD;  Location: [facility] ENDOSCOPY;  Service: Cardiovascular;  Laterality: N/A;  . TONSILLECTOMY     HPI:  TYSHAE STAIR is  a 78 y.o. female with medical history significant for combined systolic and diastolic heart failure (last EF 40%), multiple CVA with most recent being right MCA stroke in November of 2019 with residual left-sided hemiparesis, undocumented COPD and hypertension who presented to the ED from her skilled nursing facility with shortness of breath x2 days and nonproductive cough.  She has been at a skilled nursing facility since her discharge from the hospital after a large territory right MCA stroke in November 2019, she was discharged on December 18 to a SNF.  Pt is known to this service having been evaluated in acute setting with continued ST services in IPR for both dysphagia and cognitive-linguistic impairments.  Pt was upgraded to regular diet with thin liquid prior to discharge.   Assessment / Plan / Recommendation Clinical Impression  Pt presents with mild oral dysphagia with oral holding, decreased bolus formation and A-P transit, and oral residue noted.  Pt benefits from liquid was to clear oral cavity.  Pt also benefits from verbal cues to improve oral transit.  Goddaughter had many questions regarding strategies to improve feeding and swallowing.  Provided extensive education in room and observed family member feeding pt AM meal.   Provided education regarding anatomy and physiology of swallowing, compensatory strategies, and diet modifications.  Family verbalized and demonstrated understanding of swallow precautions.  Pharyngeal phase of swallow appears functional clinically with MBSS in November revealing on  mild pharyngel dysphagia with transient penetration of thin liquid by straw.  Chest imaging is not especially concerning for pneumonia.  Family reports concern for aspiration with coughing noted at meals and pt requesting to be patted on back.  These symptoms were not noted during today's clinical evaluation.  There may be a component of esophageal dysphagia that causes thsese symptoms as well.  Pt  endorsed that she was supressing these reactions when questioned by family.    Pt would benefit from MBSS to further assess pharyngeal swallow function.   Recommend continuing soft diet with thin liquid at present. SLP Visit Diagnosis: Dysphagia, oropharyngeal phase (R13.12)    Aspiration Risk  Mild aspiration risk    Diet Recommendation Dysphagia 3 (Mech soft);Thin liquid   Liquid Administration via: Cup Medication Administration: (As tolerated) Supervision: Staff to assist with self feeding Compensations: Minimize environmental distractions;Slow rate;Small sips/bites;Lingual sweep for clearance of pocketing;Follow solids with liquid Postural Changes: Seated upright at 90 degrees    Other  Recommendations Oral Care Recommendations: Oral care BID   Follow up Recommendations          Swallow Study   General HPI: CICI RODRIGES is a 78 y.o. female with medical history significant for combined systolic and diastolic heart failure (last EF 40%), multiple CVA with most recent being right MCA stroke in November of 2019 with residual left-sided hemiparesis, undocumented COPD and hypertension who presented to the ED from her skilled nursing facility with shortness of breath x2 days and nonproductive cough.  She has been at a skilled nursing facility since her discharge from the hospital after a large territory right MCA stroke in November 2019, she was discharged on December 18 to a SNF.  Pt is known to this service having been evaluated in acute setting with continued ST services in IPR for both dysphagia and cognitive-linguistic impairments.  Pt was upgraded to regular diet with thin liquid prior to discharge. Type of Study: Bedside Swallow Evaluation Previous Swallow Assessment: MBSS in November; ongoing ST services in IPR, SNF.   Diet Prior to this Study: Dysphagia 3 (soft);Thin liquids Respiratory Status: Nasal cannula History of Recent Intubation: No Behavior/Cognition:  Cooperative;Alert;Pleasant mood Oral Cavity - Dentition: Dentures, top Vision: Functional for self-feeding Self-Feeding Abilities: Needs assist Patient Positioning: Upright in bed Baseline Vocal Quality: Normal Volitional Cough: Strong Volitional Swallow: Able to elicit    Oral/Motor/Sensory Function Overall Oral Motor/Sensory Function: Mild impairment Facial ROM: Reduced left Facial Symmetry: Abnormal symmetry left Lingual ROM: Reduced left Lingual Symmetry: Abnormal symmetry left Lingual Strength: Reduced Velum: Within Functional Limits Mandible: Within Functional Limits   Ice Chips Ice chips: Not tested   Thin Liquid Thin Liquid: Within functional limits Presentation: Cup    Nectar Thick Nectar Thick Liquid: Not tested   Honey Thick Honey Thick Liquid: Not tested   Puree Puree: Within functional limits Presentation: Spoon   Solid     Solid: Impaired Presentation: Self Fed(Family fed) Oral Phase Impairments: Impaired mastication;Poor awareness of bolus Oral Phase Functional Implications: Oral residue;Oral holding;Impaired mastication;Prolonged oral transit      Celedonio Savage, [place], [place] Office: [phone]; Pager (1/2): [phone] 03/30/2018,10:11 AM

## 2018-03-30 NOTE — Progress Notes (Addendum)
Progress Note  Patient Name: Brandy [name] Date of Encounter: 03/30/2018  Primary Cardiologist: Kate Sable, MD   Subjective   Shortness of breath is improved.  Now back in normal sinus rhythm  Inpatient Medications    Scheduled Meds: . apixaban  5 mg Oral BID  . arformoterol  15 mcg Nebulization BID  . atorvastatin  40 mg Oral q1800  . carvedilol  25 mg Oral BID WC  . donepezil  5 mg Oral QHS  . furosemide  40 mg Intravenous Daily  . ipratropium-albuterol  3 mL Nebulization BID  . sodium chloride flush  3 mL Intravenous Q12H   Continuous Infusions: . sodium chloride     PRN Meds: sodium chloride, acetaminophen, metoprolol tartrate, ondansetron (ZOFRAN) IV, sodium chloride flush, traMADol   Vital Signs    Vitals:   03/29/18 2142 03/29/18 2352 03/30/18 0117 03/30/18 0856  BP:  132/83    Pulse:  95  100  Resp:  (!) 22  20  Temp:  98.4 F (36.9 C)    TempSrc:  Oral    SpO2: 96% 97%  90%  Weight:   69.7 kg   Height:        Intake/Output Summary (Last 24 hours) at 03/30/2018 7408 Last data filed at 03/29/2018 2239 Gross per 24 hour  Intake -  Output 100 ml  Net -100 ml   Filed Weights   03/28/18 2012 03/30/18 0117  Weight: 85.7 kg 69.7 kg    Telemetry    NSR with PVCs - Personally Reviewed  ECG    No new EKG to revivew - Personally Reviewed  Physical Exam   GEN: No acute distress.   Neck: No JVD Cardiac: RRR, no murmurs, rubs, or gallops.  Respiratory: Clear to auscultation bilaterally. GI: Soft, nontender, non-distended  MS: No edema; No deformity. Neuro:  Nonfocal  Psych: Normal affect   Labs    Chemistry Recent Labs  Lab 03/28/18 2018 03/29/18 0420 03/30/18 0421  NA 144 144 143  K 3.9 5.2* 3.5  CL 111 112* 110  CO2 22 21* 24  GLUCOSE 106* 84 92  BUN 26* 25* 27*  CREATININE 1.09* 0.92 1.11*  CALCIUM 9.4 9.0 9.0  PROT 6.7  --  5.8*  ALBUMIN 2.8*  --  2.4*  AST 21  --  17  ALT 12  --  14  ALKPHOS 41  --  38  BILITOT  0.2*  --  0.4  GFRNONAA 50* >60 49*  GFRAA 58* >60 56*  ANIONGAP 11 11 9      Hematology Recent Labs  Lab 03/28/18 2018 03/30/18 0421  WBC 8.4 6.5  RBC 3.94 3.19*  HGB 11.4* 8.8*  HCT 36.5 28.6*  MCV 92.6 89.7  MCH 28.9 27.6  MCHC 31.2 30.8  RDW 15.2 15.0  PLT 222 212    Cardiac Enzymes Recent Labs  Lab 03/28/18 2018 03/29/18 0420 03/29/18 1004 03/29/18 1630  TROPONINI 0.31* 0.27* 0.31* 0.33*   No results for input(s): TROPIPOC in the last 168 hours.   BNP Recent Labs  Lab 03/28/18 2018  BNP 776.4*     DDimer No results for input(s): DDIMER in the last 168 hours.   Radiology    Dg Chest 2 View  Result Date: 03/29/2018 CLINICAL DATA:  78 year old who had acute RIGHT MIDDLE cerebral artery occlusion with endovascular revascularization 02/10/2018. Follow-up possible LEFT lung base pneumonia. EXAM: CHEST - 2 VIEW COMPARISON:  03/28/2018, 02/15/2018 and earlier. FINDINGS: AP ERECT  and LATERAL images were obtained. The patient was unable to raise the LEFT arm due to her prior stroke, partially obscuring the LATERAL image. Cardiac silhouette moderately enlarged, unchanged. Thoracic aorta mildly atherosclerotic, unchanged. Hilar and mediastinal contours otherwise unremarkable. Since the examination yesterday, improved aeration in the LEFT lung base without residual consolidation. Prominent bronchovascular markings diffusely and moderate to marked central peribronchial thickening, more so than on prior examinations. Normal pulmonary vascularity. No visible pleural effusions. Osseous demineralization. Degenerative changes throughout the thoracic spine. Cervicothoracic levoscoliosis and thoracolumbar dextroscoliosis is noted previously. IMPRESSION: 1. Improved aeration in the LEFT lung base without convincing evidence of residual consolidation. 2. Moderate to severe changes of acute bronchitis and/or asthma without focal airspace pneumonia. 3. Stable cardiomegaly without pulmonary  edema. Electronically Signed   By: Evangeline Dakin M.D.   On: 03/29/2018 14:23   Dg Chest Port 1 View  Result Date: 03/28/2018 CLINICAL DATA:  Shortness of breath for 2 days EXAM: PORTABLE CHEST 1 VIEW COMPARISON:  February 15, 2018 FINDINGS: The heart size and mediastinal contours are stable. Heart size is enlarged. There is mild patchy opacity of the medial left lung base. There is no pulmonary edema or pleural effusion. The visualized skeletal structures are stable. IMPRESSION: Mild patchy opacity of the medial left lung base, developing pneumonia is not excluded. Electronically Signed   By: Abelardo Diesel M.D.   On: 03/28/2018 21:12    Cardiac Studies   2D echo 02/13/2018 Study Conclusions  - Left ventricle: The cavity size was moderately dilated. Systolic   function was moderately to severely reduced. The estimated   ejection fraction was in the range of 30% to 35%. Moderate   diffuse hypokinesis. - Mitral valve: There was mild regurgitation. - Left atrium: Cannot rule out early forming thrombus in the LA   appendage vs. artifact from posterior wall of appendage. The   atrium was moderately dilated. No evidence of thrombus in the   atrial cavity or appendage. - Right atrium: No evidence of thrombus in the atrial cavity or   appendage. - Tricuspid valve: There was mild regurgitation. - Pulmonic valve: There was trivial regurgitation. - Impressions: The patient had frequent PACs and nonsustained   atrial tachycardia.  Impressions:  - The patient had frequent PACs and nonsustained atrial   tachycardia.  Cardiac Cath 06/03/2017  Ost 2nd Diag to 2nd Diag lesion is 25% stenosed.  Mid RCA lesion is 25% stenosed.  LV end diastolic pressure is moderately elevated.  There is no aortic valve stenosis.  LV end diastolic pressure is normal.  Hemodynamic findings consistent with mild pulmonary hypertension.  CO 5.2 L/min; CI 2.86; Ao 94%; PA sat 65%, mean PA 29 mm Hg; PCWP 20  mm Hg   Mild, nonobstructive CAD.   Mildly elevated right heart pressures with volume overload.   Continue medical therapy.  EF not assessed to minimize contrast exposure.  Patient Profile     78 y.o. female with a hx of nonischemic cardiomyopathy who is being seen for the evaluation of atrial fibrillation and acute on chronic combined systolic and diastolic heart failure  Assessment & Plan    1.  New onset rapid atrial fibrillation -This is likely the etiology for recurrent CVA.   -Aspirin and Plavix have been discontinued and she is now on Eliquis 5 mg twice daily per neurology -She has converted back to normal sinus rhythm on telemetry -currently on carvedilol 25 mg twice daily  -Avoid calcium channel blockers given left ventricular  systolic dysfunction.    2.  Acute on chronic combined systolic and diastolic heart failure -Likely triggered by rapid atrial fibrillation -She received 1 dose of IV Lasix 40 mg and is ordered to receive 40 mg daily.   -She put out 1.3L yesterday and is net neg 1L -Will change to Lasix 40 mg p.o. twice daily today -She developed angioedema with lisinopril and thus ACE inhibitors and angiotensin receptor blockers are being avoided. -Continue carvedilol 25 mg twice daily -Start hydralazine 10 mg 3 times daily and Imdur 15 mg daily for preload and afterload reduction -Replete potassium to keep greater than 4  3.  History of recurrent CVA -She has been changed from aspirin and Plavix to apixaban 5 mg twice daily -Continue statin  4.  Hypertension -BP is improved  -Continue carvedilol 25 mg twice daily. -Adding hydralazine 10 mg 3 times daily and Imdur 15 mg daily for CHF -Not use ARB/ACE inhibitor due to history of angioedema  5.  Hyperlipidemia -Continue atorvastatin. -LDL was at goal at 52 on 02/11/2018  6.  Troponin elevation -Consistent with demand ischemia in the context of rapid atrial fibrillation and decompensated heart  failure.   -Mild nonobstructive disease by cardiac catheterization earlier this year. -No further work-up  I have spent a total of 40 minutes with patient reviewing TEE from 01/2018 , telemetry, EKGs, labs and examining patient, lengthy discussion with her goddaughter as well as conversation with family member Cardiologist from [place] as well as establishing an assessment and plan that was discussed with the patient.  > 50% of time was spent in direct patient care.       For questions or updates, please contact [place] Please consult [email or web address] for contact info under Cardiology/STEMI.      Signed, Fransico Him, MD  03/30/2018, 9:38 AM

## 2018-03-30 NOTE — Evaluation (Signed)
Physical Therapy Evaluation Patient Details Name: Brandy [name] MRN: [id number] DOB: [birth year] Today's Date: 03/30/2018   History of Present Illness  Brandy [name] is a 78 y.o. female with medical history significant for combined systolic and diastolic heart failure (last EF 40%), multiple CVA with most recent being right MCA stroke in November of 2019 with residual left-sided hemiparesis, undocumented COPD and hypertension who presented to the ED from her skilled nursing facility with shortness of breath x2 days and nonproductive cough.  She has been at a skilled nursing facility since her discharge from the hospital after a large territory right MCA stroke in November 2019, she was discharged on December 18 to a SNF.    Clinical Impression  Pt admitted with above diagnosis. Pt currently with functional limitations due to the deficits listed below (see PT Problem List). Pt needs +82mod to max assit for most mobility.  Pt+2 mod to max  assist to come to EOB.  Pt was able to sit with min guard assist for 1 minute before fatigue. +2 Max assist transfer to chair   Will follow acutely. Pt will benefit from skilled PT to increase their independence and safety with mobility to allow discharge to the venue listed below.     Follow Up Recommendations SNF;Supervision/Assistance - 24 hour    Equipment Recommendations  Other (comment)(TBA)    Recommendations for Other Services       Precautions / Restrictions Precautions Precautions: Fall Precaution Comments: mild Lt inattention, left hemiplegia, pusher to the left, motor impersistence Required Braces or Orthoses: Splint/Cast Splint/Cast: left UE splint(checked for redness, none noted) Splint/Cast - Date Prophylactic Dressing Applied (if applicable): 62/83/15 Restrictions Weight Bearing Restrictions: No      Mobility  Bed Mobility Overal bed mobility: Needs Assistance Bed Mobility: Rolling;Sidelying to Sit Rolling: Min assist;Max  assist(min to left, max to right) Sidelying to sit: Mod assist;+2 for physical assistance;Max assist       General bed mobility comments: Needed assist and cues to roll and reach for rail to be cleaned as her purewick leaked.  Pt then needed assist to raise trunk once on her side as well as assist to move LES off bed.    Transfers Overall transfer level: Needs assistance Equipment used: 2 person hand held assist Transfers: Sit to/from [employer] Sit to Stand: Mod assist;Max assist;+2 physical assistance   Squat pivot transfers: Mod assist;Max assist;+2 physical assistance     General transfer comment: With use of pad and gait belt, pt was able to squat and pivot to chair with mod to max assist and cues.  Pt did weight bear on LES and needed left knee blocked and assist to pivot but right LE and UE did assist.    Ambulation/Gait                Stairs            Wheelchair Mobility    Modified Rankin (Stroke Patients Only)       Balance Overall balance assessment: Needs assistance Sitting-balance support: Single extremity supported;Feet supported Sitting balance-Leahy Scale: Fair Sitting balance - Comments: Can sit with right UE supported as long as she doesn't start pushing with min guard assist for up to a minute.  Loses balance to left especially with fatigue.  Pertinent Vitals/Pain Pain Assessment: No/denies pain Faces Pain Scale: No hurt    Home Living Family/patient expects to be discharged to:: Private residence Living Arrangements: Spouse/significant other Available Help at Discharge: Family;Available 24 hours/day(Supervision only by husband) Type of Home: House Home Access: Stairs to enter Entrance Stairs-Rails: None Entrance Stairs-Number of Steps: 1 Home Layout: Two level;Able to live on main level with bedroom/bathroom Home Equipment: Shower seat;Grab bars -  tub/shower Additional Comments: Pt was at [facility] for a week and then readmit.     Prior Function Level of Independence: Needs assistance   Gait / Transfers Assistance Needed: 2 person assist since CVA with transfers to wheelchair  ADL's / Homemaking Assistance Needed: mod assist with bahting and dressing        Hand Dominance   Dominant Hand: Right    Extremity/Trunk Assessment   Upper Extremity Assessment Upper Extremity Assessment: Defer to OT evaluation    Lower Extremity Assessment Lower Extremity Assessment: LLE deficits/detail;RLE deficits/detail RLE Deficits / Details: grossly 2/5 LLE Deficits / Details: grossly 2+/5       Communication   Communication: Expressive difficulties  Cognition Arousal/Alertness: Awake/alert Behavior During Therapy: Flat affect Overall Cognitive Status: Impaired/Different from baseline Area of Impairment: Safety/judgement;Problem solving                         Safety/Judgement: Decreased awareness of safety;Decreased awareness of deficits   Problem Solving: Decreased initiation;Difficulty sequencing;Requires verbal cues;Slow processing        General Comments      Exercises     Assessment/Plan    PT Assessment Patient needs continued PT services  PT Problem List Decreased activity tolerance;Decreased balance;Decreased mobility;Decreased strength;Decreased knowledge of use of DME;Decreased safety awareness;Decreased knowledge of precautions;Cardiopulmonary status limiting activity;Decreased coordination;Impaired sensation       PT Treatment Interventions DME instruction;Gait training;Functional mobility training;Therapeutic activities;Therapeutic exercise;Balance training;Cognitive remediation;Wheelchair mobility training;Patient/family education    PT Goals (Current goals can be found in the Care Plan section)  Acute Rehab PT Goals Patient Stated Goal: to go to Rehab PT Goal Formulation: With patient Time  For Goal Achievement: 04/13/18 Potential to Achieve Goals: Good    Frequency Min 2X/week   Barriers to discharge Decreased caregiver support      Co-evaluation               AM-PAC PT "6 Clicks" Mobility  Outcome Measure Help needed turning from your back to your side while in a flat bed without using bedrails?: A Lot Help needed moving from lying on your back to sitting on the side of a flat bed without using bedrails?: A Lot Help needed moving to and from a bed to a chair (including a wheelchair)?: Total Help needed standing up from a chair using your arms (e.g., wheelchair or bedside chair)?: Total Help needed to walk in hospital room?: Total Help needed climbing 3-5 steps with a railing? : Total 6 Click Score: 8    End of Session Equipment Utilized During Treatment: Gait belt Activity Tolerance: Patient limited by fatigue Patient left: in chair;with call Rump/phone within reach;with chair alarm set;with family/visitor present Nurse Communication: Mobility status;Need for lift equipment PT Visit Diagnosis: Unsteadiness on feet (R26.81);Muscle weakness (generalized) (M62.81);Hemiplegia and hemiparesis Hemiplegia - Right/Left: Left Hemiplegia - dominant/non-dominant: Non-dominant Hemiplegia - caused by: Cerebral infarction    Time: 1040-1120 PT Time Calculation (min) (ACUTE ONLY): 40 min   Charges:   PT Evaluation $PT Eval Moderate Complexity: 1 Mod  PT Treatments $Therapeutic Activity: 8-22 mins $Self Care/Home Management: 8-22        [place] Pager:  [phone]  Office:  [place] 03/30/2018, 1:46 PM

## 2018-03-30 NOTE — Care Management (Addendum)
#    2.   S / W CHERYL  @  [facility] #  [phone]  ELIQUIS  5 MG  BID COVER- YES CO-PAY - $ 40.00 TIER-  2 DRUG PRIOR APPROVAL- NO  INITIAL COVERAGE PHASE  PREFERRED PHARMACY :  YES [facility] M/O 90 DAY SUPPLY  FOR M/O $ 80.00

## 2018-03-30 NOTE — Progress Notes (Addendum)
PROGRESS NOTE    Brandy [name]  HYQ:[id number] DOB: 1941/01/21 DOA: 03/28/2018 PCP: [name], MD   Brief Narrative:  Brandy [name] is a 77 y.o. female with medical history significant for combined systolic and diastolic heart failure (last EF 40%), multiple CVA with most recent being right MCA stroke in November of 2019 with residual left-sided hemiparesis, undocumented COPD and hypertension who presented to the ED from her skilled nursing facility with shortness of breath x2 days and nonproductive cough.  She has been at a skilled nursing facility since her discharge from the hospital after a large territory right MCA stroke in November 2019, she was discharged on December 18 to a SNF.  She reports shortness of breath at rest and with exertion.  Patient denies orthopnea or PND.  No wheezing or stridor.  Denies episodes of frank aspiration.  Her daughter states that she has slightly increased lower extremity edema from baseline.  The patient denies fevers, denies chills, denies chest pain or palpitations.  She denies abdominal pain, nausea, vomiting, urinary changes.  No direct sick contacts, however patient is currently at [facility].  She endorses compliance with her heart failure regimen of aspirin, Lipitor, Coreg, Plavix.  Since discharge, she has been taking Ritalin. Of note, patient had LHC in March of 2019 showing no significant atherosclerotic disease.  Assessment & Plan:   Atrial Fibrillation with RVR:  -Given recent stroke and Chadvasc of  at least 7 -After discussion with cardiology and neurology ( Dr.Xu) my partner yesterday she was transitioned from aspirin and Plavix over to Eliquis -Continue Coreg, she has converted to sinus rhythm dose increased to 25 mg twice daily, avoid calcium channel blockers if possible due to low EF  Acute on chronic systolic and diastolic CHF  -She was felt to be mildly volume overloaded on admission diuresed with IV Lasix, -Echo from 11/201 9 with EF of  30 to 35% moderate diffuse hypokinesis -Transition to oral Lasix, given history of angioedema with ACE inhibitors ACE and ARB not ordered -Continue carvedilol -Appreciate cardiology input  History of recurrent CVA -Most recent with left hemiplegia flaccidity -Currently an SNF for rehab, aspirin Plavix changed to apixaban, continue statin -See discussion on anticoagulation above -SLP evaluation to assess dysphagia again due to symptoms  Elevated troponin -Suspect secondary to demand from A. fib RVR and mild diastolic CHF -She had nonobstructive CAD on cardiac cath last year -No further cardiac work-up needed at this time -Continue statin and Coreg  Pyuria -Asymptomatic, although history limited, monitor off antibiotics  Abnormal chest x-ray -She had a repeat chest x-ray yesterday which showed improved aeration without convincing evidence of residual consolidation -Antibiotics were discontinued yesterday I agree there is no indication to continue this at this time -SLP evaluation  Chronic ormocytic anemia -Stable from previous admissions, defer to PCP for outpatient work-up  DVT prophylaxis: eliquis Code Status: full  Family Communication: godDaughter at bedside Disposition Plan: SNF in few days  Consultants:   cardiology  Procedures:  Pending echo   Antimicrobials:  Anti-infectives (From admission, onward)   Start     Dose/Rate Route Frequency Ordered Stop   03/31/18 1000  azithromycin (ZITHROMAX) tablet 250 mg  Status:  Discontinued     250 mg Oral Daily 03/29/18 0037 03/29/18 1616   03/30/18 0600  azithromycin (ZITHROMAX) tablet 500 mg  Status:  Discontinued     500 mg Oral Daily 03/29/18 0037 03/29/18 1616   03/29/18 0600  cefTRIAXone (ROCEPHIN) 1 g in  sodium chloride 0.9 % 100 mL IVPB  Status:  Discontinued     1 g 200 mL/hr over 30 Minutes Intravenous Every 24 hours 03/29/18 0037 03/29/18 1616   03/28/18 2230  levofloxacin (LEVAQUIN) IVPB 500 mg     500 mg 100  mL/hr over 60 Minutes Intravenous  Once 03/28/18 2227 03/29/18 0058     Subjective: -Feels better overall, breathing is improving -Family at bedside reports cough after meals  Objective: Vitals:   03/30/18 0117 03/30/18 0856 03/30/18 1013 03/30/18 1241  BP:   (!) 147/83 (!) 144/90  Pulse:  100 100 92  Resp:  20  20  Temp:    98 F (36.7 C)  TempSrc:      SpO2:  90% 100% 100%  Weight: 69.7 kg     Height:        Intake/Output Summary (Last 24 hours) at 03/30/2018 1324 Last data filed at 03/30/2018 1307 Gross per 24 hour  Intake 123 ml  Output 100 ml  Net 23 ml   Filed Weights   03/28/18 2012 03/30/18 0117  Weight: 85.7 kg 69.7 kg    Examination:  Gen: Awake, Alert, lucid, aphasic HEENT: PERRLA, Neck supple, no JVD Lungs: Clear bilaterally CVS: S1-S2/irregularly irregular rhythm Abd: soft, Non tender, non distended, BS present Extremities: No edema Skin: no new rashes Neuro, left hemiplegia, flaccid from prior baseline  Data Reviewed: I have personally reviewed following labs and imaging studies  CBC: Recent Labs  Lab 03/28/18 2018 03/30/18 0421  WBC 8.4 6.5  NEUTROABS 6.1  --   HGB 11.4* 8.8*  HCT 36.5 28.6*  MCV 92.6 89.7  PLT 222 119   Basic Metabolic Panel: Recent Labs  Lab 03/28/18 2018 03/29/18 0420 03/30/18 0421  NA 144 144 143  K 3.9 5.2* 3.5  CL 111 112* 110  CO2 22 21* 24  GLUCOSE 106* 84 92  BUN 26* 25* 27*  CREATININE 1.09* 0.92 1.11*  CALCIUM 9.4 9.0 9.0  MG  --  1.9 1.7   GFR: Estimated Creatinine Clearance: 42 mL/min (A) (by C-G formula based on SCr of 1.11 mg/dL (H)). Liver Function Tests: Recent Labs  Lab 03/28/18 2018 03/30/18 0421  AST 21 17  ALT 12 14  ALKPHOS 41 38  BILITOT 0.2* 0.4  PROT 6.7 5.8*  ALBUMIN 2.8* 2.4*   No results for input(s): LIPASE, AMYLASE in the last 168 hours. No results for input(s): AMMONIA in the last 168 hours. Coagulation Profile: Recent Labs  Lab 03/29/18 0420  INR 1.35   Cardiac  Enzymes: Recent Labs  Lab 03/28/18 2018 03/29/18 0420 03/29/18 1004 03/29/18 1630  TROPONINI 0.31* 0.27* 0.31* 0.33*   BNP (last 3 results) No results for input(s): PROBNP in the last 8760 hours. HbA1C: No results for input(s): HGBA1C in the last 72 hours. CBG: Recent Labs  Lab 03/29/18 2107  GLUCAP 93   Lipid Profile: No results for input(s): CHOL, HDL, LDLCALC, TRIG, CHOLHDL, LDLDIRECT in the last 72 hours. Thyroid Function Tests: Recent Labs    03/29/18 0421  TSH 1.033   Anemia Panel: No results for input(s): VITAMINB12, FOLATE, FERRITIN, TIBC, IRON, RETICCTPCT in the last 72 hours. Sepsis Labs: No results for input(s): PROCALCITON, LATICACIDVEN in the last 168 hours.  Recent Results (from the past 240 hour(s))  Respiratory Panel by PCR     Status: None   Collection Time: 03/29/18 12:55 AM  Result Value Ref Range Status   Adenovirus NOT DETECTED NOT DETECTED Final  Coronavirus 229E NOT DETECTED NOT DETECTED Final   Coronavirus HKU1 NOT DETECTED NOT DETECTED Final   Coronavirus NL63 NOT DETECTED NOT DETECTED Final   Coronavirus OC43 NOT DETECTED NOT DETECTED Final   Metapneumovirus NOT DETECTED NOT DETECTED Final   Rhinovirus / Enterovirus NOT DETECTED NOT DETECTED Final   Influenza A NOT DETECTED NOT DETECTED Final   Influenza B NOT DETECTED NOT DETECTED Final   Parainfluenza Virus 1 NOT DETECTED NOT DETECTED Final   Parainfluenza Virus 2 NOT DETECTED NOT DETECTED Final   Parainfluenza Virus 3 NOT DETECTED NOT DETECTED Final   Parainfluenza Virus 4 NOT DETECTED NOT DETECTED Final   Respiratory Syncytial Virus NOT DETECTED NOT DETECTED Final   Bordetella pertussis NOT DETECTED NOT DETECTED Final   Chlamydophila pneumoniae NOT DETECTED NOT DETECTED Final   Mycoplasma pneumoniae NOT DETECTED NOT DETECTED Final    Comment: Performed at [facility], [place] [address] [place],  [address]         Radiology Studies: Dg Chest 2 View  Result Date:  03/29/2018 CLINICAL DATA:  78 year old who had acute RIGHT MIDDLE cerebral artery occlusion with endovascular revascularization 02/10/2018. Follow-up possible LEFT lung base pneumonia. EXAM: CHEST - 2 VIEW COMPARISON:  03/28/2018, 02/15/2018 and earlier. FINDINGS: AP ERECT and LATERAL images were obtained. The patient was unable to raise the LEFT arm due to her prior stroke, partially obscuring the LATERAL image. Cardiac silhouette moderately enlarged, unchanged. Thoracic aorta mildly atherosclerotic, unchanged. Hilar and mediastinal contours otherwise unremarkable. Since the examination yesterday, improved aeration in the LEFT lung base without residual consolidation. Prominent bronchovascular markings diffusely and moderate to marked central peribronchial thickening, more so than on prior examinations. Normal pulmonary vascularity. No visible pleural effusions. Osseous demineralization. Degenerative changes throughout the thoracic spine. Cervicothoracic levoscoliosis and thoracolumbar dextroscoliosis is noted previously. IMPRESSION: 1. Improved aeration in the LEFT lung base without convincing evidence of residual consolidation. 2. Moderate to severe changes of acute bronchitis and/or asthma without focal airspace pneumonia. 3. Stable cardiomegaly without pulmonary edema. Electronically Signed   By: Evangeline Dakin M.D.   On: 03/29/2018 14:23   Dg Chest Port 1 View  Result Date: 03/28/2018 CLINICAL DATA:  Shortness of breath for 2 days EXAM: PORTABLE CHEST 1 VIEW COMPARISON:  February 15, 2018 FINDINGS: The heart size and mediastinal contours are stable. Heart size is enlarged. There is mild patchy opacity of the medial left lung base. There is no pulmonary edema or pleural effusion. The visualized skeletal structures are stable. IMPRESSION: Mild patchy opacity of the medial left lung base, developing pneumonia is not excluded. Electronically Signed   By: Abelardo Diesel M.D.   On: 03/28/2018 21:12         Scheduled Meds: . apixaban  5 mg Oral BID  . arformoterol  15 mcg Nebulization BID  . atorvastatin  40 mg Oral q1800  . carvedilol  25 mg Oral BID WC  . donepezil  5 mg Oral QHS  . furosemide  40 mg Oral BID  . hydrALAZINE  10 mg Oral Q8H  . ipratropium-albuterol  3 mL Nebulization BID  . isosorbide mononitrate  15 mg Oral Daily  . sodium chloride flush  3 mL Intravenous Q12H   Continuous Infusions: . sodium chloride       LOS: 2 days    Time spent: 42min    Domenic Polite, MD [facility] Page via [email or web address]  If 7PM-7AM, please contact night-coverage [email or web address] Password [email or web address] 03/30/2018, 1:24 PM

## 2018-03-30 NOTE — Progress Notes (Signed)
  Echocardiogram 2D Echocardiogram has been performed.  Jannett Celestine 03/30/2018, 4:10 PM

## 2018-03-30 NOTE — NC FL2 (Signed)
[place] MEDICAID FL2 LEVEL OF CARE SCREENING TOOL     IDENTIFICATION  Patient Name: Brandy [name] Birthdate: 14-Mar-1941 Sex: female Admission Date (Current Location): 03/28/2018  [facility] and [place] Number:  [employer] and Address:  [facility], [place] [address] [place], [place] [address]      Provider Number: [id number]  Attending Physician Name and Address:  Domenic Polite, MD  Relative Name and Phone Number:       Current Level of Care: Hospital Recommended Level of Care: [place] Prior Approval Number:    Date Approved/Denied:   PASRR Number: [id number]  Discharge Plan: SNF    Current Diagnoses: Patient Active Problem List   Diagnosis Date Noted  . Acute exacerbation of congestive heart failure ([place]) 03/28/2018  . Left hemiplegia ([place])   . Chronic kidney disease   . Hypokalemia   . Acute on chronic anemia   . Hypoalbuminemia due to protein-calorie malnutrition ([place])   . Hemiparesis affecting left side as late effect of stroke ([place]) 02/16/2018  . Left-sided neglect 02/16/2018  . Right middle cerebral artery stroke ([place]) 02/16/2018  . Acute embolic stroke ([place])   . Acute right MCA stroke ([place])   . History of CVA with residual deficit   . Benign essential HTN   . Chronic combined systolic and diastolic congestive heart failure ([place])   . History of fusion of cervical spine   . Stage 3 chronic kidney disease ([place])   . Acute blood loss anemia   . Stroke ([place]) 02/10/2018  . Middle cerebral artery embolism, right 02/10/2018  . Health care maintenance 11/01/2017  . Non-ischemic cardiomyopathy ([place]) 09/14/2017  . Precordial chest pain 09/14/2017  . B12 deficiency 09/13/2017  . Tachycardia 09/13/2017  . Acute ischemic stroke ([place])   . Seasonal allergies 07/13/2017  . TIA (transient ischemic attack) 07/12/2017  . Anemia 07/12/2017  . Pneumonia 06/02/2017  . Pleural effusion 06/02/2017  . Acute on chronic  systolic congestive heart failure ([place])   . Elevated troponin   . Other fatigue 05/04/2017  . Dyspnea 05/04/2017  . Pseudophakia of both eyes 08/06/2016  . Chronic combined systolic and diastolic CHF (congestive heart failure) ([place]) 07/09/2016  . Hypertensive heart disease   . Hypertension   . Nuclear sclerosis, bilateral 05/24/2016  . HTN (hypertension) 01/22/2013  . TOBACCO ABUSE 02/18/2008  . CARDIOMYOPATHY 02/18/2008  . Diverticulosis of colon 02/18/2008  . OSTEOPENIA 02/18/2008  . Disorder of bone and cartilage 02/18/2008    Orientation RESPIRATION BLADDER Height & Weight     Self, Time, Situation, Place  Normal Incontinent Weight: 153 lb 10.6 oz (69.7 kg)(RN notified of the difference) Height:  5\' 4"  (162.6 cm)  BEHAVIORAL SYMPTOMS/MOOD NEUROLOGICAL BOWEL NUTRITION STATUS  (None) (None) Continent Diet(Soft)  AMBULATORY STATUS COMMUNICATION OF NEEDS Skin   Extensive Assist Verbally Bruising                       Personal Care Assistance Level of Assistance              Functional Limitations Info  Sight, Hearing, Speech Sight Info: Adequate Hearing Info: Adequate Speech Info: Adequate    SPECIAL CARE FACTORS FREQUENCY  PT (By licensed PT), Speech therapy     PT Frequency: 5 x week       Speech Therapy Frequency: 5 x week      Contractures Contractures Info: Not present    Additional Factors Info  Code Status, Allergies Code Status Info: Full code Allergies Info: Lisinopril.           Current Medications (03/30/2018):  This is the current hospital active medication list Current Facility-Administered Medications  Medication Dose Route Frequency Provider Last Rate Last Dose  . 0.9 %  sodium chloride infusion  250 mL Intravenous PRN Bennie Pierini, MD      . acetaminophen (TYLENOL) tablet 650 mg  650 mg Oral Q4H PRN Bennie Pierini, MD      . apixaban Arne Cleveland) tablet 5 mg  5 mg Oral BID Ronna Polio, RPH   5 mg at 03/30/18 1014  .  arformoterol (BROVANA) nebulizer solution 15 mcg  15 mcg Nebulization BID Bennie Pierini, MD   15 mcg at 03/30/18 [id number]  . atorvastatin (LIPITOR) tablet 40 mg  40 mg Oral q1800 Bennie Pierini, MD   40 mg at 03/29/18 1731  . carvedilol (COREG) tablet 25 mg  25 mg Oral BID WC Elodia Florence., MD   25 mg at 03/30/18 1014  . donepezil (ARICEPT) tablet 5 mg  5 mg Oral QHS Bennie Pierini, MD   5 mg at 03/29/18 2224  . furosemide (LASIX) tablet 40 mg  40 mg Oral BID Fransico Him R, MD      . hydrALAZINE (APRESOLINE) tablet 10 mg  10 mg Oral Q8H Turner, Traci R, MD      . ipratropium-albuterol (DUONEB) 0.5-2.5 (3) MG/3ML nebulizer solution 3 mL  3 mL Nebulization BID Elodia Florence., MD   3 mL at 03/30/18 0856  . isosorbide mononitrate (IMDUR) 24 hr tablet 15 mg  15 mg Oral Daily Sueanne Margarita, MD   15 mg at 03/30/18 1014  . ondansetron (ZOFRAN) injection 4 mg  4 mg Intravenous Q6H PRN Bennie Pierini, MD      . sodium chloride flush (NS) 0.9 % injection 3 mL  3 mL Intravenous Q12H Bennie Pierini, MD   3 mL at 03/30/18 1014  . sodium chloride flush (NS) 0.9 % injection 3 mL  3 mL Intravenous PRN Bennie Pierini, MD      . traMADol Veatrice Bourbon) tablet 50 mg  50 mg Oral Q6H PRN Bennie Pierini, MD   50 mg at 03/30/18 1317     Discharge Medications: Please see discharge summary for a list of discharge medications.  Relevant Imaging Results:  Relevant Lab Results:   Additional Information SS#: [id number]. Discharged from [facility] to [facility] on 12/19 and admitted to the hospital 12/31.  Candie Chroman, LCSW

## 2018-03-30 NOTE — Clinical Social Work Note (Signed)
Clinical Social Work Assessment  Patient Details  Name: Brandy [name] MRN: [id number] Date of Birth: 1941-03-07  Date of referral:  03/30/18               Reason for consult:  Facility Placement, Discharge Planning                Permission sought to share information with:  Facility Sport and exercise psychologist, Family Supports Permission granted to share information::     Name::     Ricka Westra  Agency::  SNF's  Relationship::  Husband  Contact Information:  [phone]  Housing/Transportation Living arrangements for the past 2 months:  [place], [place] of Information:  Patient, Medical Team, Spouse, Other (Comment Required)(Female relative at bedside.) Patient Interpreter Needed:  None Criminal Activity/Legal Involvement Pertinent to Current Situation/Hospitalization:  No - Comment as needed Significant Relationships:  Spouse, Adult Children Lives with:  Spouse Do you feel safe going back to the place where you live?  Yes Need for family participation in patient care:  Yes (Comment)  Care giving concerns:  Patient is a short-term rehab resident from [facility]. PT recommending continued rehab after discharge.   Social Worker assessment / plan:  CSW met with patient. Husband and younger female relative at bedside. CSW introduced role and explained that discharge planning would be discussed. Patient had her eyes closed throughout the discussion but would nod in response to some questions. Patient's family confirmed she was at [facility] for rehab prior to admission. Plan is for continued rehab after discharge. Notified patient's husband that she has used 12 SNF days and therefore only has about 8 left that would be covered by her insurance at 100%. Notified them of 20% copay after day 20. No further concerns. CSW encouraged patient and her family to contact CSW as needed. CSW will continue to follow patient and her family for support and facilitate  discharge to SNF once medically stable.  Employment status:  Retired Nurse, adult PT Recommendations:  [place] / Referral to community resources:  [place]  Patient/Family's Response to care:  Patient and her family agreeable to further rehab after discharge. Patient's family supportive and involved in patient's care. Patient and her family appreciated social work intervention.  Patient/Family's Understanding of and Emotional Response to Diagnosis, Current Treatment, and Prognosis:  Patient and her family have a good understanding of the reason for admission and her need for continued rehab prior to returning home. Patient and her family appear happy with hospital care.  Emotional Assessment Appearance:  Appears stated age Attitude/Demeanor/Rapport:  Lethargic Affect (typically observed):  Accepting Orientation:  Oriented to Self, Oriented to Place, Oriented to  Time, Oriented to Situation Alcohol / Substance use:  Tobacco Use Psych involvement (Current and /or in the community):  No (Comment)  Discharge Needs  Concerns to be addressed:  Care Coordination Readmission within the last 30 days:  No Current discharge risk:  Dependent with Mobility Barriers to Discharge:  Continued Medical Work up, [place], LCSW 03/30/2018, 3:04 PM

## 2018-03-31 ENCOUNTER — Ambulatory Visit: Payer: Medicare Other | Admitting: Student

## 2018-03-31 ENCOUNTER — Inpatient Hospital Stay (HOSPITAL_COMMUNITY): Payer: Medicare Other

## 2018-03-31 DIAGNOSIS — I5023 Acute on chronic systolic (congestive) heart failure: Secondary | ICD-10-CM

## 2018-03-31 LAB — BASIC METABOLIC PANEL
Anion gap: 10 (ref 5–15)
BUN: 23 mg/dL (ref 8–23)
CO2: 24 mmol/L (ref 22–32)
Calcium: 9.1 mg/dL (ref 8.9–10.3)
Chloride: 108 mmol/L (ref 98–111)
Creatinine, Ser: 1.06 mg/dL — ABNORMAL HIGH (ref 0.44–1.00)
GFR calc non Af Amer: 51 mL/min — ABNORMAL LOW (ref >60)
GFR, EST AFRICAN AMERICAN: 59 mL/min — AB (ref >60)
Glucose, Bld: 92 mg/dL (ref 70–99)
Potassium: 3.8 mmol/L (ref 3.5–5.1)
Sodium: 142 mmol/L (ref 135–145)

## 2018-03-31 LAB — MAGNESIUM: Magnesium: 2.2 mg/dL (ref 1.7–2.4)

## 2018-03-31 MED ORDER — POTASSIUM CHLORIDE 20 MEQ PO PACK
20.0000 meq | PACK | Freq: Every day | ORAL | Status: DC
  Administered 2018-03-31: 20 meq via ORAL
  Filled 2018-03-31 (×2): qty 1

## 2018-03-31 MED ORDER — POTASSIUM CHLORIDE CRYS ER 20 MEQ PO TBCR: 20.0000 meq | EXTENDED_RELEASE_TABLET | Freq: Once | ORAL | Status: DC

## 2018-03-31 MED ORDER — POTASSIUM CHLORIDE CRYS ER 20 MEQ PO TBCR: 20.0000 meq | EXTENDED_RELEASE_TABLET | Freq: Every day | ORAL | Status: DC

## 2018-03-31 NOTE — Progress Notes (Signed)
Modified Barium Swallow Progress Note  Patient Details  Name: Brandy [name] MRN: [id number] Date of Birth: May 03, 1940  Today's Date: 03/31/2018  Modified Barium Swallow completed.  Full report located under Chart Review in the Imaging Section.  Brief recommendations include the following:  Clinical Impression  Pt presented with a primary oral dysphagia with decreased sensation, decreased bolus cohesion, piecemeal bolusing with solids and liquids.  Pt demonstrated consistent and reliable airway protection with adequate laryngeal vestibule closure, no penetration/aspiration, good pharyngeal clearance.  There were trace residuals of solids that backflowed from below to superior to UES, but the amount was negligible.  There was no difficulty swallowing a 13 mm barium pill when consumed with applesauce.  Recommend continuing a soft diet with thin liquids; meds no longer need to be crushed.   Spoke with pt/husband re: results/recs.  Per RN, family prefers continued crushing of meds.  Recommend continued SLP f/u at SNF for oral dysphagia, cognitive-linguistic function s/p CVA. No further acute care SLP warranted. Our service will sign off.     Swallow Evaluation Recommendations       SLP Diet Recommendations: soft diet;Thin liquid   Liquid Administration via: Cup;Straw   Medication Administration: Whole meds with puree   Supervision: Patient able to self feed;Staff to assist with self feeding   Compensations: Minimize environmental distractions;Small sips/bites;Lingual sweep for clearance of pocketing   Postural Changes: Remain semi-upright after after feeds/meals (Comment)   Oral Care Recommendations: Oral care BID       Dawnielle Christiana L. Tivis Ringer, [place] Office number [phone] Pager [phone]  [name] 03/31/2018,2:58 PM

## 2018-03-31 NOTE — Progress Notes (Signed)
PROGRESS NOTE    Brandy [name]  LOV:[id number] DOB: Jun 18, 1940 DOA: 03/28/2018 PCP: [name], MD   Brief Narrative:  Brandy [name] is a 78 y.o. female with medical history significant for combined systolic and diastolic heart failure (last EF 40%), multiple CVA with most recent being right MCA stroke in November of 2019 with residual left-sided hemiparesis, undocumented COPD and hypertension who presented to the ED from her skilled nursing facility with shortness of breath x2 days and nonproductive cough.  She has been at a skilled nursing facility since her discharge from the hospital after a large territory right MCA stroke in November 2019, she was discharged on December 18 to a SNF.  She reports shortness of breath at rest and with exertion.  Patient denies orthopnea or PND.  No wheezing or stridor.  Denies episodes of frank aspiration.  Her daughter states that she has slightly increased lower extremity edema from baseline.  The patient denies fevers, denies chills, denies chest pain or palpitations.  She denies abdominal pain, nausea, vomiting, urinary changes.  No direct sick contacts, however patient is currently at [facility].  She endorses compliance with her heart failure regimen of aspirin, Lipitor, Coreg, Plavix.  Since discharge, she has been taking Ritalin. Of note, patient had LHC in March of 2019 showing no significant atherosclerotic disease.  Assessment & Plan:   Atrial Fibrillation with RVR:  -Given recent stroke and [place] of  at least 7 -After discussion with cardiology and neurology ( Dr.Xu) she was transitioned from aspirin and Plavix over to Eliquis -Continue Coreg, dose increased to 25 mg twice daily, she is converted to sinus rhythm -Avoid calcium channel blockers due to low EF  Acute on chronic systolic and diastolic CHF  -She was felt to be mildly volume overloaded on admission diuresed with IV Lasix, -Echo from 11/201 9 with EF of 30 to 35% moderate diffuse  hypokinesis, repeat echo with further reduction in EF to 25% -Clinically appears close to euvolemic, currently transitioned to oral Lasix 40 mg twice daily  -I/O appears inaccurate from last 24 hours -Appreciate cardiology input -Family extremely anxious given cardiac complications and quick admission  History of recurrent CVA -Most recent with left hemiplegia flaccidity -Currently an SNF for rehab, aspirin Plavix changed to apixaban, continue statin -See discussion on anticoagulation above -SLP following, on dysphagia 3 diet for modified barium swallow today  Elevated troponin -Suspect secondary to demand from A. fib RVR and mild diastolic CHF -She had nonobstructive CAD on cardiac cath last year -No further cardiac work-up needed at this time -Continue statin and Coreg  Pyuria -Asymptomatic, although history limited, monitor off antibiotics  Abnormal chest x-ray -She had a repeat chest x-ray yesterday which showed improved aeration without convincing evidence of residual consolidation -Antibiotics were discontinued yesterday I agree there is no indication to continue this at this time -SLP evaluation, MBS today  Chronic ormocytic anemia -Stable from previous admissions, defer to PCP for outpatient work-up  DVT prophylaxis: eliquis Code Status: full  Family Communication: goddaughter at bedside Disposition Plan: SNF in few days  Consultants:   cardiology  Procedures:  Pending echo   Antimicrobials:  Anti-infectives (From admission, onward)   Start     Dose/Rate Route Frequency Ordered Stop   03/31/18 1000  azithromycin (ZITHROMAX) tablet 250 mg  Status:  Discontinued     250 mg Oral Daily 03/29/18 0037 03/29/18 1616   03/30/18 0600  azithromycin (ZITHROMAX) tablet 500 mg  Status:  Discontinued  500 mg Oral Daily 03/29/18 0037 03/29/18 1616   03/29/18 0600  cefTRIAXone (ROCEPHIN) 1 g in sodium chloride 0.9 % 100 mL IVPB  Status:  Discontinued     1 g 200 mL/hr  over 30 Minutes Intravenous Every 24 hours 03/29/18 0037 03/29/18 1616   03/28/18 2230  levofloxacin (LEVAQUIN) IVPB 500 mg     500 mg 100 mL/hr over 60 Minutes Intravenous  Once 03/28/18 2227 03/29/18 0058     Subjective: -Feels better, in sinus rhythm, breathing is improving -For modified barium swallow this afternoon  Objective: Vitals:   03/30/18 2204 03/31/18 0124 03/31/18 0437 03/31/18 0926  BP: 129/80  139/85 128/79  Pulse: 90  90 93  Resp: 18  18 18   Temp: 98.4 F (36.9 C)  97.8 F (36.6 C) 98.5 F (36.9 C)  TempSrc: Oral  Oral Oral  SpO2: 97%  99% 99%  Weight:  63.9 kg    Height:        Intake/Output Summary (Last 24 hours) at 03/31/2018 1415 Last data filed at 03/31/2018 1100 Gross per 24 hour  Intake 336.84 ml  Output 100 ml  Net 236.84 ml   Filed Weights   03/28/18 2012 03/30/18 0117 03/31/18 0124  Weight: 85.7 kg 69.7 kg 63.9 kg    Examination:  Gen: Awake alert, aphasic, mumbles a few words appropriately, appears to be oriented HEENT: PERRLA, Neck supple, no JVD Lungs: Decreased breath sounds at both bases CVS: S1-S2 regular rate rhythm today Abd: soft, Non tender, non distended, BS present Extremities: No edema Skin: no new rashes Neuro, left hemiplegia, flaccid from prior baseline, dysarthria  Data Reviewed: I have personally reviewed following labs and imaging studies  CBC: Recent Labs  Lab 03/28/18 2018 03/30/18 0421  WBC 8.4 6.5  NEUTROABS 6.1  --   HGB 11.4* 8.8*  HCT 36.5 28.6*  MCV 92.6 89.7  PLT 222 161   Basic Metabolic Panel: Recent Labs  Lab 03/28/18 2018 03/29/18 0420 03/30/18 0421 03/31/18 0526  NA 144 144 143 142  K 3.9 5.2* 3.5 3.8  CL 111 112* 110 108  CO2 22 21* 24 24  GLUCOSE 106* 84 92 92  BUN 26* 25* 27* 23  CREATININE 1.09* 0.92 1.11* 1.06*  CALCIUM 9.4 9.0 9.0 9.1  MG  --  1.9 1.7 2.2   GFR: Estimated Creatinine Clearance: 39.6 mL/min (A) (by C-G formula based on SCr of 1.06 mg/dL (H)). Liver Function  Tests: Recent Labs  Lab 03/28/18 2018 03/30/18 0421  AST 21 17  ALT 12 14  ALKPHOS 41 38  BILITOT 0.2* 0.4  PROT 6.7 5.8*  ALBUMIN 2.8* 2.4*   No results for input(s): LIPASE, AMYLASE in the last 168 hours. No results for input(s): AMMONIA in the last 168 hours. Coagulation Profile: Recent Labs  Lab 03/29/18 0420  INR 1.35   Cardiac Enzymes: Recent Labs  Lab 03/28/18 2018 03/29/18 0420 03/29/18 1004 03/29/18 1630  TROPONINI 0.31* 0.27* 0.31* 0.33*   BNP (last 3 results) No results for input(s): PROBNP in the last 8760 hours. HbA1C: No results for input(s): HGBA1C in the last 72 hours. CBG: Recent Labs  Lab 03/29/18 2107  GLUCAP 93   Lipid Profile: No results for input(s): CHOL, HDL, LDLCALC, TRIG, CHOLHDL, LDLDIRECT in the last 72 hours. Thyroid Function Tests: Recent Labs    03/29/18 0421  TSH 1.033   Anemia Panel: No results for input(s): VITAMINB12, FOLATE, FERRITIN, TIBC, IRON, RETICCTPCT in the last 72 hours.  Sepsis Labs: No results for input(s): PROCALCITON, LATICACIDVEN in the last 168 hours.  Recent Results (from the past 240 hour(s))  Respiratory Panel by PCR     Status: None   Collection Time: 03/29/18 12:55 AM  Result Value Ref Range Status   Adenovirus NOT DETECTED NOT DETECTED Final   Coronavirus 229E NOT DETECTED NOT DETECTED Final   Coronavirus HKU1 NOT DETECTED NOT DETECTED Final   Coronavirus NL63 NOT DETECTED NOT DETECTED Final   Coronavirus OC43 NOT DETECTED NOT DETECTED Final   Metapneumovirus NOT DETECTED NOT DETECTED Final   Rhinovirus / Enterovirus NOT DETECTED NOT DETECTED Final   Influenza A NOT DETECTED NOT DETECTED Final   Influenza B NOT DETECTED NOT DETECTED Final   Parainfluenza Virus 1 NOT DETECTED NOT DETECTED Final   Parainfluenza Virus 2 NOT DETECTED NOT DETECTED Final   Parainfluenza Virus 3 NOT DETECTED NOT DETECTED Final   Parainfluenza Virus 4 NOT DETECTED NOT DETECTED Final   Respiratory Syncytial Virus NOT  DETECTED NOT DETECTED Final   Bordetella pertussis NOT DETECTED NOT DETECTED Final   Chlamydophila pneumoniae NOT DETECTED NOT DETECTED Final   Mycoplasma pneumoniae NOT DETECTED NOT DETECTED Final    Comment: Performed at [facility], [address] [place], [place] [address]         Radiology Studies: Dg Chest 2 View  Result Date: 03/29/2018 CLINICAL DATA:  78 year old who had acute RIGHT MIDDLE cerebral artery occlusion with endovascular revascularization 02/10/2018. Follow-up possible LEFT lung base pneumonia. EXAM: CHEST - 2 VIEW COMPARISON:  03/28/2018, 02/15/2018 and earlier. FINDINGS: AP ERECT and LATERAL images were obtained. The patient was unable to raise the LEFT arm due to her prior stroke, partially obscuring the LATERAL image. Cardiac silhouette moderately enlarged, unchanged. Thoracic aorta mildly atherosclerotic, unchanged. Hilar and mediastinal contours otherwise unremarkable. Since the examination yesterday, improved aeration in the LEFT lung base without residual consolidation. Prominent bronchovascular markings diffusely and moderate to marked central peribronchial thickening, more so than on prior examinations. Normal pulmonary vascularity. No visible pleural effusions. Osseous demineralization. Degenerative changes throughout the thoracic spine. Cervicothoracic levoscoliosis and thoracolumbar dextroscoliosis is noted previously. IMPRESSION: 1. Improved aeration in the LEFT lung base without convincing evidence of residual consolidation. 2. Moderate to severe changes of acute bronchitis and/or asthma without focal airspace pneumonia. 3. Stable cardiomegaly without pulmonary edema. Electronically Signed   By: Evangeline Dakin M.D.   On: 03/29/2018 14:23   Dg Swallowing Func-speech Pathology  Result Date: 03/31/2018 Objective Swallowing Evaluation: Type of Study: MBS-Modified Barium Swallow Study  Patient Details Name: JUMANA PACCIONE MRN: [id number] Date of Birth:  1940/10/18 Today's Date: 03/31/2018 Time: SLP Start Time (ACUTE ONLY): 1330 -SLP Stop Time (ACUTE ONLY): 1400 SLP Time Calculation (min) (ACUTE ONLY): 30 min Past Medical History: Past Medical History: Diagnosis Date . Cardiomyopathy   a. EF of 25% in 07/2006 b. EF normalized by repeat echo in 2011 c. EF 35-40% by echo in 05/2017 with cath showing mild nonobstructive CAD . Chronic combined systolic and diastolic CHF (congestive heart failure) ([place])  . Colon polyps  . Diverticulosis of colon  . Hypertension  . Hypertensive heart disease  . Osteopenia  . Stroke ([place])  . TIA (transient ischemic attack)  . Tobacco abuse   50 pack year Past Surgical History: Past Surgical History: Procedure Laterality Date . BREAST BIOPSY   . CARPAL TUNNEL RELEASE    right hand . CERVICAL FUSION   . CESAREAN SECTION    2 times .  COLONOSCOPY  1017  pt uncertain as to whether polypectomy required or performed . ECTOPIC PREGNANCY SURGERY   . IR ANGIO VERTEBRAL SEL SUBCLAVIAN INNOMINATE UNI R MOD SED  02/10/2018 . IR CT HEAD LTD  02/10/2018 . IR PERCUTANEOUS ART THROMBECTOMY/INFUSION INTRACRANIAL INC DIAG ANGIO  02/10/2018 . RADIOLOGY WITH ANESTHESIA N/A 02/10/2018  Procedure: RADIOLOGY WITH ANESTHESIA;  Surgeon: Luanne Bras, MD;  Location: [facility];  Service: Radiology;  Laterality: N/A; . RIGHT/LEFT HEART CATH AND CORONARY ANGIOGRAPHY N/A 06/03/2017  Procedure: RIGHT/LEFT HEART CATH AND CORONARY ANGIOGRAPHY;  Surgeon: Jettie Booze, MD;  Location: [facility];  Service: Cardiovascular;  Laterality: N/A; . TEE WITHOUT CARDIOVERSION N/A 02/13/2018  Procedure: TRANSESOPHAGEAL ECHOCARDIOGRAM (TEE);  Surgeon: Sueanne Margarita, MD;  Location: [facility] ENDOSCOPY;  Service: Cardiovascular;  Laterality: N/A; . TONSILLECTOMY   HPI: Brandy [name] is a 79 y.o. female with medical history significant for combined systolic and diastolic heart failure (last EF 40%), multiple CVA with most recent being right MCA stroke in November of 2019 with  residual left-sided hemiparesis, ACDF C5-7 (2007?), undocumented COPD and hypertension who presented to the ED from her skilled nursing facility with shortness of breath x2 days and nonproductive cough.  She has been at a skilled nursing facility since her discharge from the hospital after a large territory right MCA stroke in November 2019, she was discharged on December 18 to a SNF.  Pt is known to this service having been evaluated in acute setting with continued ST services in IPR for both dysphagia and cognitive-linguistic impairments.  Pt was upgraded to regular diet with thin liquid prior to discharge.  Subjective: alert, husband present for study Assessment / Plan / Recommendation CHL IP CLINICAL IMPRESSIONS 03/31/2018 Clinical Impression Pt presented with a primary oral dysphagia with decreased sensation, decreased bolus cohesion, piecemeal bolusing with solids and liquids.  Pt demonstrated consistent and reliable airway protection with adequate laryngeal vestibule closure, no penetration/aspiration, good pharyngeal clearance.  There were trace residuals of solids that backflowed from below to superior to UES, but the amount was negligible.  There was no difficulty swallowing a 13 mm barium pill when consumed with applesauce.  Recommend continuing a regular diet with thin liquids; meds no longer need to be crushed.   SLP Visit Diagnosis Dysphagia, oral phase (R13.11) Attention and concentration deficit following -- Frontal lobe and executive function deficit following -- Impact on safety and function Mild aspiration risk   CHL IP TREATMENT RECOMMENDATION 03/31/2018 Treatment Recommendations No treatment recommended at this time   Prognosis 02/14/2018 Prognosis for Safe Diet Advancement [place] Barriers to Reach Goals -- Barriers/Prognosis Comment -- CHL IP DIET RECOMMENDATION 03/31/2018 SLP Diet Recommendations Regular solids;Thin liquid Liquid Administration via Cup;Straw Medication Administration Whole meds with  puree Compensations Minimize environmental distractions;Small sips/bites;Lingual sweep for clearance of pocketing Postural Changes Remain semi-upright after after feeds/meals (Comment)   CHL IP OTHER RECOMMENDATIONS 03/31/2018 Recommended Consults -- Oral Care Recommendations Oral care BID Other Recommendations --   CHL IP FOLLOW UP RECOMMENDATIONS 03/31/2018 Follow up Recommendations Skilled Nursing facility   [facility] IP FREQUENCY AND DURATION 02/15/2018 Speech Therapy Frequency (ACUTE ONLY) min 2x/week Treatment Duration --      CHL IP ORAL PHASE 03/31/2018 Oral Phase Impaired Oral - Pudding Teaspoon -- Oral - Pudding Cup -- Oral - Honey Teaspoon -- Oral - Honey Cup -- Oral - Nectar Teaspoon -- Oral - Nectar Cup -- Oral - Nectar Straw -- Oral - Thin Teaspoon -- Oral - Thin Cup --  Oral - Thin Straw Piecemeal swallowing;Lingual/palatal residue;Decreased bolus cohesion Oral - Puree Lingual/palatal residue;Piecemeal swallowing;Decreased bolus cohesion;Delayed oral transit Oral - Mech Soft Lingual/palatal residue;Piecemeal swallowing;Delayed oral transit;Decreased bolus cohesion Oral - Regular -- Oral - Multi-Consistency -- Oral - Pill -- Oral Phase - Comment --  CHL IP PHARYNGEAL PHASE 03/31/2018 Pharyngeal Phase WFL Pharyngeal- Pudding Teaspoon -- Pharyngeal -- Pharyngeal- Pudding Cup -- Pharyngeal -- Pharyngeal- Honey Teaspoon -- Pharyngeal -- Pharyngeal- Honey Cup -- Pharyngeal -- Pharyngeal- Nectar Teaspoon -- Pharyngeal -- Pharyngeal- Nectar Cup -- Pharyngeal -- Pharyngeal- Nectar Straw -- Pharyngeal -- Pharyngeal- Thin Teaspoon -- Pharyngeal -- Pharyngeal- Thin Cup -- Pharyngeal -- Pharyngeal- Thin Straw -- Pharyngeal -- Pharyngeal- Puree -- Pharyngeal -- Pharyngeal- Mechanical Soft -- Pharyngeal -- Pharyngeal- Regular -- Pharyngeal -- Pharyngeal- Multi-consistency -- Pharyngeal -- Pharyngeal- Pill -- Pharyngeal -- Pharyngeal Comment --  CHL IP CERVICAL ESOPHAGEAL PHASE 02/14/2018 Cervical Esophageal Phase WFL Pudding  Teaspoon -- Pudding Cup -- Honey Teaspoon -- Honey Cup -- Nectar Teaspoon -- Nectar Cup -- Nectar Straw -- Thin Teaspoon -- Thin Cup -- Thin Straw -- Puree -- Mechanical Soft -- Regular -- Multi-consistency -- Pill -- Cervical Esophageal Comment -- [name] 03/31/2018, 2:14 PM                   Scheduled Meds: . apixaban  5 mg Oral BID  . arformoterol  15 mcg Nebulization BID  . atorvastatin  40 mg Oral q1800  . carvedilol  25 mg Oral BID WC  . donepezil  5 mg Oral QHS  . furosemide  40 mg Oral BID  . hydrALAZINE  10 mg Oral Q8H  . ipratropium-albuterol  3 mL Nebulization BID  . isosorbide mononitrate  15 mg Oral Daily  . potassium chloride  20 mEq Oral Daily  . sodium chloride flush  3 mL Intravenous Q12H   Continuous Infusions: . sodium chloride       LOS: 3 days    Time spent: 81min    Domenic Polite, MD [facility] Page via [email or web address]  If 7PM-7AM, please contact night-coverage [email or web address] Password [email or web address] 03/31/2018, 2:15 PM

## 2018-03-31 NOTE — Progress Notes (Addendum)
Progress Note  Patient Name: Brandy [name] Date of Encounter: 03/31/2018  Primary Cardiologist: Kate Sable, MD   Subjective   Shortness of breath is improved.  Now back in normal sinus rhythm  Inpatient Medications    Scheduled Meds: . apixaban  5 mg Oral BID  . arformoterol  15 mcg Nebulization BID  . atorvastatin  40 mg Oral q1800  . carvedilol  25 mg Oral BID WC  . donepezil  5 mg Oral QHS  . furosemide  40 mg Oral BID  . hydrALAZINE  10 mg Oral Q8H  . ipratropium-albuterol  3 mL Nebulization BID  . isosorbide mononitrate  15 mg Oral Daily  . sodium chloride flush  3 mL Intravenous Q12H   Continuous Infusions: . sodium chloride     PRN Meds: sodium chloride, acetaminophen, ondansetron (ZOFRAN) IV, sodium chloride flush, traMADol   Vital Signs    Vitals:   03/30/18 2204 03/31/18 0124 03/31/18 0437 03/31/18 0926  BP: 129/80  139/85 128/79  Pulse: 90  90 93  Resp: 18  18 18   Temp: 98.4 F (36.9 C)  97.8 F (36.6 C) 98.5 F (36.9 C)  TempSrc: Oral  Oral Oral  SpO2: 97%  99% 99%  Weight:  63.9 kg    Height:        Intake/Output Summary (Last 24 hours) at 03/31/2018 0947 Last data filed at 03/31/2018 0900 Gross per 24 hour  Intake 279.84 ml  Output 100 ml  Net 179.84 ml   Filed Weights   03/28/18 2012 03/30/18 0117 03/31/18 0124  Weight: 85.7 kg 69.7 kg 63.9 kg    Telemetry    NSR with PVCs and 13 beat run of wide-complex tachycardia- Personally Reviewed  ECG    No new EKG to revivew - Personally Reviewed  Physical Exam   GEN: No acute distress.   Neck: No JVD Cardiac: RRR, no murmurs, rubs, or gallops.  Respiratory: Clear to auscultation bilaterally. GI: Soft, nontender, non-distended  MS: No edema; No deformity. Neuro:  Nonfocal  Psych: Normal affect   Labs    Chemistry Recent Labs  Lab 03/28/18 2018 03/29/18 0420 03/30/18 0421 03/31/18 0526  NA 144 144 143 142  K 3.9 5.2* 3.5 3.8  CL 111 112* 110 108  CO2 22 21* 24 24    GLUCOSE 106* 84 92 92  BUN 26* 25* 27* 23  CREATININE 1.09* 0.92 1.11* 1.06*  CALCIUM 9.4 9.0 9.0 9.1  PROT 6.7  --  5.8*  --   ALBUMIN 2.8*  --  2.4*  --   AST 21  --  17  --   ALT 12  --  14  --   ALKPHOS 41  --  38  --   BILITOT 0.2*  --  0.4  --   GFRNONAA 50* >60 49* 51*  GFRAA 58* >60 56* 59*  ANIONGAP 11 11 9 10      Hematology Recent Labs  Lab 03/28/18 2018 03/30/18 0421  WBC 8.4 6.5  RBC 3.94 3.19*  HGB 11.4* 8.8*  HCT 36.5 28.6*  MCV 92.6 89.7  MCH 28.9 27.6  MCHC 31.2 30.8  RDW 15.2 15.0  PLT 222 212    Cardiac Enzymes Recent Labs  Lab 03/28/18 2018 03/29/18 0420 03/29/18 1004 03/29/18 1630  TROPONINI 0.31* 0.27* 0.31* 0.33*   No results for input(s): TROPIPOC in the last 168 hours.   BNP Recent Labs  Lab 03/28/18 2018  BNP 776.4*  DDimer No results for input(s): DDIMER in the last 168 hours.   Radiology    Dg Chest 2 View  Result Date: 03/29/2018 CLINICAL DATA:  78 year old who had acute RIGHT MIDDLE cerebral artery occlusion with endovascular revascularization 02/10/2018. Follow-up possible LEFT lung base pneumonia. EXAM: CHEST - 2 VIEW COMPARISON:  03/28/2018, 02/15/2018 and earlier. FINDINGS: AP ERECT and LATERAL images were obtained. The patient was unable to raise the LEFT arm due to her prior stroke, partially obscuring the LATERAL image. Cardiac silhouette moderately enlarged, unchanged. Thoracic aorta mildly atherosclerotic, unchanged. Hilar and mediastinal contours otherwise unremarkable. Since the examination yesterday, improved aeration in the LEFT lung base without residual consolidation. Prominent bronchovascular markings diffusely and moderate to marked central peribronchial thickening, more so than on prior examinations. Normal pulmonary vascularity. No visible pleural effusions. Osseous demineralization. Degenerative changes throughout the thoracic spine. Cervicothoracic levoscoliosis and thoracolumbar dextroscoliosis is noted  previously. IMPRESSION: 1. Improved aeration in the LEFT lung base without convincing evidence of residual consolidation. 2. Moderate to severe changes of acute bronchitis and/or asthma without focal airspace pneumonia. 3. Stable cardiomegaly without pulmonary edema. Electronically Signed   By: Evangeline Dakin M.D.   On: 03/29/2018 14:23    Cardiac Studies   2D echo 02/13/2018 Study Conclusions  - Left ventricle: The cavity size was moderately dilated. Systolic   function was moderately to severely reduced. The estimated   ejection fraction was in the range of 30% to 35%. Moderate   diffuse hypokinesis. - Mitral valve: There was mild regurgitation. - Left atrium: Cannot rule out early forming thrombus in the LA   appendage vs. artifact from posterior wall of appendage. The   atrium was moderately dilated. No evidence of thrombus in the   atrial cavity or appendage. - Right atrium: No evidence of thrombus in the atrial cavity or   appendage. - Tricuspid valve: There was mild regurgitation. - Pulmonic valve: There was trivial regurgitation. - Impressions: The patient had frequent PACs and nonsustained   atrial tachycardia.  Impressions:  - The patient had frequent PACs and nonsustained atrial   tachycardia.  Cardiac Cath 06/03/2017  Ost 2nd Diag to 2nd Diag lesion is 25% stenosed.  Mid RCA lesion is 25% stenosed.  LV end diastolic pressure is moderately elevated.  There is no aortic valve stenosis.  LV end diastolic pressure is normal.  Hemodynamic findings consistent with mild pulmonary hypertension.  CO 5.2 L/min; CI 2.86; Ao 94%; PA sat 65%, mean PA 29 mm Hg; PCWP 20 mm Hg   Mild, nonobstructive CAD.   Mildly elevated right heart pressures with volume overload.   Continue medical therapy.  EF not assessed to minimize contrast exposure.  Patient Profile     78 y.o. female with a hx of nonischemic cardiomyopathy who is being seen for the evaluation of atrial  fibrillation and acute on chronic combined systolic and diastolic heart failure  Assessment & Plan    1.  New onset rapid atrial fibrillation -This is likely the etiology for recurrent CVA.   -Aspirin and Plavix have been discontinued and she is now on Eliquis 5 mg twice daily per neurology -She has converted back to normal sinus rhythm on telemetry -currently on carvedilol 25 mg twice daily  -Avoid calcium channel blockers given left ventricular systolic dysfunction.   -Is maintaining normal sinus rhythm on exam today and on telemetry.  2.  Acute on chronic combined systolic and diastolic heart failure -Likely triggered by rapid atrial fibrillation -She received 1  dose of IV Lasix 40 mg and is ordered to receive 40 mg daily.   -I's and O's appear incomplete and weight is down 40 pounds which I do not think is accurate as well -Continue Lasix 40 mg p.o. twice daily today -She developed angioedema with lisinopril and thus ACE inhibitors and angiotensin receptor blockers are being avoided. -Continue carvedilol 25 mg twice daily, hydralazine 10 mg 3 times daily and Imdur 15 mg daily for LV dysfunction -Replete potassium to keep greater than 4  3.  History of recurrent CVA -She has been changed from aspirin and Plavix to apixaban 5 mg twice daily -Continue statin  4.  Hypertension -BP is improved  -Continue carvedilol 25 mg twice daily, hydralazine 10 mg 3 times daily and Imdur 15 mg daily for CHF -Not use ARB/ACE inhibitor due to history of angioedema  5.  Hyperlipidemia -Continue atorvastatin. -LDL was at goal at 52 on 02/11/2018  6.  Troponin elevation -Consistent with demand ischemia in the context of rapid atrial fibrillation and decompensated heart failure (0.31>0.33). -Mild nonobstructive disease by cardiac catheterization earlier this year. -No further ischemic work-up    7.  Anemia -Hemoglobin 11.4 on admission and has dropped to 8.8 -Per TRH  8.  Wide-complex  tachycardia consistent with ventricular tachycardia -She had a 13 beat run of wide-complex tachycardia yesterday and was asymptomatic -Replete potassium > 4, mag > 2 -give Kdur 49meq today -check Mag level -Continue beta-blocker  For questions or updates, please contact [facility] Please consult [email or web address] for contact info under Cardiology/STEMI.      Signed, Fransico Him, MD  03/31/2018, 9:47 AM

## 2018-03-31 NOTE — Clinical Social Work Note (Signed)
Patient asleep. Provided husband with list of bed offers and CMS Medicare scores. He is interested in [facility]. They are full right now but CSW asked admissions coordinator to review for potential bed offer if they have an opening when she is stable.  Dayton Scrape, [place]

## 2018-03-31 NOTE — Significant Event (Signed)
Patient's god daughter requesting that staff wait on patient's spouse to arrive to unit before going to Barium swallowing study. Barium study has been moved to 1:30pm, as patient's spouse has not arrived yet.    Brandy [name]

## 2018-03-31 NOTE — Consult Note (Signed)
[facility] Nurse wound consult note Patient receiving care in [place].  Spouse in room.  Assisted by primary RN. Reason for Consult: existing sacral wound Wound type: MASD to right buttock Wound bed: pink with surrounding tissue that is darker in color and consistent with Chronic Tissue Injury Drainage (amount, consistency, odor) No odor, drainage or induration Periwound: Intact Dressing procedure/placement/frequency: Apply [employer] Clear (in the purple and white tube in clean utility) on the open wound areas on the right buttock.  If a foam dressing is in place, you can simply peel the dressing down and apply the ointment, then replace the dressing over the area.  Change the foam every 3 - 5 days and prn. Monitor the wound area(s) for worsening of condition such as: Signs/symptoms of infection,  Increase in size,  Development of or worsening of odor, Development of pain, or increased pain at the affected locations.  Notify the medical team if any of these develop.  Thank you for the consult.  Discussed plan of care with the patient and bedside nurse.  [facility] nurse will not follow at this time.  Please re-consult the [facility] team if needed.  Val Riles, RN, MSN, CWOCN, CNS-BC, pager [phone]

## 2018-03-31 NOTE — Care Management Important Message (Signed)
Important Message  Patient Details  Name: Brandy [name] MRN: [id number] Date of Birth: 11/23/1940   Medicare Important Message Given:  Yes    [name] [place] 03/31/2018, 4:48 PM

## 2018-04-01 LAB — BASIC METABOLIC PANEL
Anion gap: 10 (ref 5–15)
BUN: 18 mg/dL (ref 8–23)
CO2: 23 mmol/L (ref 22–32)
Calcium: 8.7 mg/dL — ABNORMAL LOW (ref 8.9–10.3)
Chloride: 108 mmol/L (ref 98–111)
Creatinine, Ser: 1.03 mg/dL — ABNORMAL HIGH (ref 0.44–1.00)
GFR calc Af Amer: >60 mL/min (ref >60)
GFR calc non Af Amer: 53 mL/min — ABNORMAL LOW (ref >60)
Glucose, Bld: 79 mg/dL (ref 70–99)
Potassium: 3.4 mmol/L — ABNORMAL LOW (ref 3.5–5.1)
Sodium: 141 mmol/L (ref 135–145)

## 2018-04-01 MED ORDER — HYDRALAZINE HCL 25 MG PO TABS
25.0000 mg | ORAL_TABLET | Freq: Three times a day (TID) | ORAL | Status: DC
  Administered 2018-04-01 – 2018-04-02 (×3): 25 mg via ORAL
  Filled 2018-04-01 (×3): qty 1

## 2018-04-01 MED ORDER — POTASSIUM CHLORIDE 20 MEQ PO PACK
40.0000 meq | PACK | Freq: Every day | ORAL | Status: DC
  Administered 2018-04-02 – 2018-04-03 (×2): 40 meq via ORAL
  Filled 2018-04-01 (×3): qty 2

## 2018-04-01 MED ORDER — POTASSIUM CHLORIDE CRYS ER 10 MEQ PO TBCR
40.0000 meq | EXTENDED_RELEASE_TABLET | Freq: Two times a day (BID) | ORAL | Status: AC
  Administered 2018-04-01 (×2): 40 meq via ORAL
  Filled 2018-04-01 (×2): qty 4

## 2018-04-01 MED ORDER — SPIRONOLACTONE 12.5 MG HALF TABLET
12.5000 mg | ORAL_TABLET | Freq: Every day | ORAL | Status: DC
  Administered 2018-04-01 – 2018-04-04 (×4): 12.5 mg via ORAL
  Filled 2018-04-01 (×4): qty 1

## 2018-04-01 NOTE — Progress Notes (Signed)
Progress Note  Patient Name: Brandy [name] Date of Encounter: 04/01/2018  Primary Cardiologist: Kate Sable, MD   Subjective   Denies CP or dyspnea  Inpatient Medications    Scheduled Meds: . apixaban  5 mg Oral BID  . arformoterol  15 mcg Nebulization BID  . atorvastatin  40 mg Oral q1800  . carvedilol  25 mg Oral BID WC  . donepezil  5 mg Oral QHS  . furosemide  40 mg Oral BID  . hydrALAZINE  10 mg Oral Q8H  . ipratropium-albuterol  3 mL Nebulization BID  . isosorbide mononitrate  15 mg Oral Daily  . potassium chloride  40 mEq Oral BID  . [START ON 04/02/2018] potassium chloride  40 mEq Oral Daily  . sodium chloride flush  3 mL Intravenous Q12H   Continuous Infusions: . sodium chloride     PRN Meds: sodium chloride, acetaminophen, ondansetron (ZOFRAN) IV, sodium chloride flush, traMADol   Vital Signs    Vitals:   04/01/18 0453 04/01/18 0812 04/01/18 0935 04/01/18 1131  BP: (!) 150/98 (!) 145/85  (!) 133/97  Pulse: 89 100  73  Resp: 18   18  Temp: 98.7 F (37.1 C)   98.4 F (36.9 C)  TempSrc: Oral   Oral  SpO2: 96%  97% 96%  Weight: 63.5 kg     Height:        Intake/Output Summary (Last 24 hours) at 04/01/2018 1316 Last data filed at 04/01/2018 1207 Gross per 24 hour  Intake 60 ml  Output 1900 ml  Net -1840 ml   Filed Weights   03/30/18 0117 03/31/18 0124 04/01/18 0453  Weight: 69.7 kg 63.9 kg 63.5 kg    Telemetry     Sinus- Personally Reviewed  Physical Exam   GEN: No acute distress.   Neck: No JVD Cardiac: RRR, no murmurs, rubs, or gallops.  Respiratory: Clear to auscultation bilaterally. GI: Soft, nontender, non-distended  MS: No edema Neuro:  Residual deficits from prior CVA Psych: Normal affect   Labs    Chemistry Recent Labs  Lab 03/28/18 2018  03/30/18 0421 03/31/18 0526 04/01/18 0435  NA 144   < > 143 142 141  K 3.9   < > 3.5 3.8 3.4*  CL 111   < > 110 108 108  CO2 22   < > 24 24 23   GLUCOSE 106*   < > 92 92 79    BUN 26*   < > 27* 23 18  CREATININE 1.09*   < > 1.11* 1.06* 1.03*  CALCIUM 9.4   < > 9.0 9.1 8.7*  PROT 6.7  --  5.8*  --   --   ALBUMIN 2.8*  --  2.4*  --   --   AST 21  --  17  --   --   ALT 12  --  14  --   --   ALKPHOS 41  --  38  --   --   BILITOT 0.2*  --  0.4  --   --   GFRNONAA 50*   < > 49* 51* 53*  GFRAA 58*   < > 56* 59* >60  ANIONGAP 11   < > 9 10 10    < > = values in this interval not displayed.     Hematology Recent Labs  Lab 03/28/18 2018 03/30/18 0421  WBC 8.4 6.5  RBC 3.94 3.19*  HGB 11.4* 8.8*  HCT 36.5 28.6*  MCV  92.6 89.7  MCH 28.9 27.6  MCHC 31.2 30.8  RDW 15.2 15.0  PLT 222 212    Cardiac Enzymes Recent Labs  Lab 03/28/18 2018 03/29/18 0420 03/29/18 1004 03/29/18 1630  TROPONINI 0.31* 0.27* 0.31* 0.33*    BNP Recent Labs  Lab 03/28/18 2018  BNP 776.4*       Radiology    Dg Swallowing Func-speech Pathology  Result Date: 03/31/2018 Objective Swallowing Evaluation: Type of Study: MBS-Modified Barium Swallow Study  Patient Details Name: Brandy [name] MRN: [id number] Date of Birth: 1940-04-13 Today's Date: 03/31/2018 Time: SLP Start Time (ACUTE ONLY): 1330 -SLP Stop Time (ACUTE ONLY): 1400 SLP Time Calculation (min) (ACUTE ONLY): 30 min Past Medical History: Past Medical History: Diagnosis Date . Cardiomyopathy   a. EF of 25% in 07/2006 b. EF normalized by repeat echo in 2011 c. EF 35-40% by echo in 05/2017 with cath showing mild nonobstructive CAD . Chronic combined systolic and diastolic CHF (congestive heart failure) ([place])  . Colon polyps  . Diverticulosis of colon  . Hypertension  . Hypertensive heart disease  . Osteopenia  . Stroke ([employer])  . TIA (transient ischemic attack)  . Tobacco abuse   50 pack year Past Surgical History: Past Surgical History: Procedure Laterality Date . BREAST BIOPSY   . CARPAL TUNNEL RELEASE    right hand . CERVICAL FUSION   . CESAREAN SECTION    2 times . COLONOSCOPY  2229  pt uncertain as to whether polypectomy  required or performed . ECTOPIC PREGNANCY SURGERY   . IR ANGIO VERTEBRAL SEL SUBCLAVIAN INNOMINATE UNI R MOD SED  02/10/2018 . IR CT HEAD LTD  02/10/2018 . IR PERCUTANEOUS ART THROMBECTOMY/INFUSION INTRACRANIAL INC DIAG ANGIO  02/10/2018 . RADIOLOGY WITH ANESTHESIA N/A 02/10/2018  Procedure: RADIOLOGY WITH ANESTHESIA;  Surgeon: Luanne Bras, MD;  Location: [facility];  Service: Radiology;  Laterality: N/A; . RIGHT/LEFT HEART CATH AND CORONARY ANGIOGRAPHY N/A 06/03/2017  Procedure: RIGHT/LEFT HEART CATH AND CORONARY ANGIOGRAPHY;  Surgeon: Jettie Booze, MD;  Location: [facility];  Service: Cardiovascular;  Laterality: N/A; . TEE WITHOUT CARDIOVERSION N/A 02/13/2018  Procedure: TRANSESOPHAGEAL ECHOCARDIOGRAM (TEE);  Surgeon: Sueanne Margarita, MD;  Location: [facility] ENDOSCOPY;  Service: Cardiovascular;  Laterality: N/A; . TONSILLECTOMY   HPI: Brandy [name] is a 78 y.o. female with medical history significant for combined systolic and diastolic heart failure (last EF 40%), multiple CVA with most recent being right MCA stroke in November of 2019 with residual left-sided hemiparesis, ACDF C5-7 (2007?), undocumented COPD and hypertension who presented to the ED from her skilled nursing facility with shortness of breath x2 days and nonproductive cough.  She has been at a skilled nursing facility since her discharge from the hospital after a large territory right MCA stroke in November 2019, she was discharged on December 18 to a SNF.  Pt is known to this service having been evaluated in acute setting with continued ST services in I[facility] for both dysphagia and cognitive-linguistic impairments.  Pt was upgraded to regular diet with thin liquid prior to discharge.  Subjective: alert, husband present for study Assessment / Plan / Recommendation CHL IP CLINICAL IMPRESSIONS 03/31/2018 Clinical Impression Pt presented with a primary oral dysphagia with decreased sensation, decreased bolus cohesion, piecemeal bolusing with  solids and liquids.  Pt demonstrated consistent and reliable airway protection with adequate laryngeal vestibule closure, no penetration/aspiration, good pharyngeal clearance.  There were trace residuals of solids that backflowed from below to superior to UES, but the  amount was negligible.  There was no difficulty swallowing a 13 mm barium pill when consumed with applesauce.  Recommend continuing a regular diet with thin liquids; meds no longer need to be crushed.   SLP Visit Diagnosis Dysphagia, oral phase (R13.11) Attention and concentration deficit following -- Frontal lobe and executive function deficit following -- Impact on safety and function Mild aspiration risk   CHL IP TREATMENT RECOMMENDATION 03/31/2018 Treatment Recommendations No treatment recommended at this time   Prognosis 02/14/2018 Prognosis for Safe Diet Advancement Fair Barriers to Reach Goals -- Barriers/Prognosis Comment -- CHL IP DIET RECOMMENDATION 03/31/2018 SLP Diet Recommendations Regular solids;Thin liquid Liquid Administration via Cup;Straw Medication Administration Whole meds with puree Compensations Minimize environmental distractions;Small sips/bites;Lingual sweep for clearance of pocketing Postural Changes Remain semi-upright after after feeds/meals (Comment)   CHL IP OTHER RECOMMENDATIONS 03/31/2018 Recommended Consults -- Oral Care Recommendations Oral care BID Other Recommendations --   CHL IP FOLLOW UP RECOMMENDATIONS 03/31/2018 Follow up Recommendations Skilled Nursing facility   [facility] IP FREQUENCY AND DURATION 02/15/2018 Speech Therapy Frequency (ACUTE ONLY) min 2x/week Treatment Duration --      CHL IP ORAL PHASE 03/31/2018 Oral Phase Impaired Oral - Pudding Teaspoon -- Oral - Pudding Cup -- Oral - Honey Teaspoon -- Oral - Honey Cup -- Oral - Nectar Teaspoon -- Oral - Nectar Cup -- Oral - Nectar Straw -- Oral - Thin Teaspoon -- Oral - Thin Cup -- Oral - Thin Straw Piecemeal swallowing;Lingual/palatal residue;Decreased bolus cohesion Oral  - Puree Lingual/palatal residue;Piecemeal swallowing;Decreased bolus cohesion;Delayed oral transit Oral - Mech Soft Lingual/palatal residue;Piecemeal swallowing;Delayed oral transit;Decreased bolus cohesion Oral - Regular -- Oral - Multi-Consistency -- Oral - Pill -- Oral Phase - Comment --  CHL IP PHARYNGEAL PHASE 03/31/2018 Pharyngeal Phase WFL Pharyngeal- Pudding Teaspoon -- Pharyngeal -- Pharyngeal- Pudding Cup -- Pharyngeal -- Pharyngeal- Honey Teaspoon -- Pharyngeal -- Pharyngeal- Honey Cup -- Pharyngeal -- Pharyngeal- Nectar Teaspoon -- Pharyngeal -- Pharyngeal- Nectar Cup -- Pharyngeal -- Pharyngeal- Nectar Straw -- Pharyngeal -- Pharyngeal- Thin Teaspoon -- Pharyngeal -- Pharyngeal- Thin Cup -- Pharyngeal -- Pharyngeal- Thin Straw -- Pharyngeal -- Pharyngeal- Puree -- Pharyngeal -- Pharyngeal- Mechanical Soft -- Pharyngeal -- Pharyngeal- Regular -- Pharyngeal -- Pharyngeal- Multi-consistency -- Pharyngeal -- Pharyngeal- Pill -- Pharyngeal -- Pharyngeal Comment --  CHL IP CERVICAL ESOPHAGEAL PHASE 02/14/2018 Cervical Esophageal Phase WFL Pudding Teaspoon -- Pudding Cup -- Honey Teaspoon -- Honey Cup -- Nectar Teaspoon -- Nectar Cup -- Nectar Straw -- Thin Teaspoon -- Thin Cup -- Thin Straw -- Puree -- Mechanical Soft -- Regular -- Multi-consistency -- Pill -- Cervical Esophageal Comment -- [name] 03/31/2018, 2:14 PM               Patient Profile     78 y.o. female with a hx of nonischemic cardiomyopathywho is being seen for the evaluation of atrial fibrillation and acute on chronic combined systolic and diastolic heart failure  Assessment & Plan    1 paroxysmal atrial fibrillation-patient remains in sinus rhythm today.  Continue carvedilol for rate control if atrial fibrillation recurs.  Continue apixaban.  2 acute on chronic combined systolic/diastolic congestive heart failure-patient appears to be euvolemic today.  Continue present dose of Lasix.  Add Spironolactone 12.5 mg daily.   Would fluid restrict to 1.5 L daily.  Follow low-sodium diet.  Patient has angioedema with ACE inhibitors in the past.  Continue carvedilol and hydralazine/nitrates.  Increase hydralazine to 25 mg p.o. 3 times daily.  3 elevated troponin-as  outlined by Dr. Radford Pax no plans for further ischemia evaluation.  4 history of CVA-continue apixaban.  5 hypertension-blood pressure is elevated.  Increase hydralazine to 25 mg p.o. 3 times daily.  [facility] will sign off.   Medication Recommendations: Continue present medications as outlined in MAR. Other recommendations (labs, testing, etc):  bmet one week following DC. Follow up as an outpatient: APP in [place] 1 week following discharge.  Follow-up Dr Bronson Ing 8 to 12 weeks following discharge.  For questions or updates, please contact [facility] Please consult [email or web address] for contact info under        Signed, Kirk Ruths, MD  04/01/2018, 1:16 PM

## 2018-04-01 NOTE — Progress Notes (Signed)
PROGRESS NOTE    Brandy [name]  UVO:[id number] DOB: 1940-05-05 DOA: 03/28/2018 PCP: [name], MD   Brief Narrative:  Brandy [name] is a 78 y.o. female with medical history significant for combined systolic and diastolic heart failure (last EF 40%), multiple CVA with most recent being right MCA stroke in November of 2019 with residual left-sided hemiparesis, undocumented COPD and hypertension who presented to the ED from her skilled nursing facility with shortness of breath x2 days and nonproductive cough.  She has been at a skilled nursing facility since her discharge from the hospital after a large territory right MCA stroke in November 2019, she was discharged on December 18 to a SNF. -Since admission she was treated for A. fib with RVR and acute on chronic diastolic CHF -She has since converted to sinus rhythm, transition from aspirin/Plavix over to Eliquis and diuresed with IV Lasix, followed by Cardiology  Assessment & Plan:   Atrial Fibrillation with RVR:  -Given recent stroke and Chadvasc of  at least 7 -After discussion with cardiology and neurology ( Dr.Xu) she was transitioned from aspirin and Plavix over to Eliquis -Coreg dose was increased to 25 mg twice daily she is currently in sinus rhythm now, HR improved -Avoid calcium channel blockers due to low EF -appreciate Cards input  Acute on chronic systolic and diastolic CHF  -She was felt to be mildly volume overloaded on admission diuresed with IV Lasix, -Echo from 11/201 9 with EF of 30 to 35% moderate diffuse hypokinesis, repeat echo with further reduction in EF to 25% -Clinically appears close to euvolemic, currently transitioned to oral Lasix 40 mg twice daily  -She is -2.3 L in 2 days -Appreciate cardiology input -Family extremely anxious given cardiac complications and quick admission  History of recurrent CVA -Most recent with left hemiplegia flaccidity -Currently an SNF for rehab, aspirin Plavix changed to  apixaban, continue statin -See discussion on anticoagulation above -Status post modified barium swallow, currently on dysphagia 3 diet with thin liquids  Elevated troponin -Suspect secondary to demand from A. fib RVR and mild diastolic CHF -She had nonobstructive CAD on cardiac cath last year -No further cardiac work-up needed at this time -Continue statin and Coreg  Pyuria -Asymptomatic, although history limited, monitor off antibiotics  Abnormal chest x-ray -She had a repeat chest x-ray yesterday which showed improved aeration without convincing evidence of residual consolidation -Antibiotics were discontinued 4 days ago I agree there is no indication to continue this at this time  Chronic normocytic anemia -Stable from previous admissions, defer to PCP for outpatient work-up -hb stable  DVT prophylaxis: eliquis Code Status: full  Family Communication: Discussed with spouse at bedside  Disposition Plan: Discharge planning, social work consulted, plan for SNF for short-term rehab  Consultants:   cardiology  Procedures:   Antimicrobials:  Anti-infectives (From admission, onward)   Start     Dose/Rate Route Frequency Ordered Stop   03/31/18 1000  azithromycin (ZITHROMAX) tablet 250 mg  Status:  Discontinued     250 mg Oral Daily 03/29/18 0037 03/29/18 1616   03/30/18 0600  azithromycin (ZITHROMAX) tablet 500 mg  Status:  Discontinued     500 mg Oral Daily 03/29/18 0037 03/29/18 1616   03/29/18 0600  cefTRIAXone (ROCEPHIN) 1 g in sodium chloride 0.9 % 100 mL IVPB  Status:  Discontinued     1 g 200 mL/hr over 30 Minutes Intravenous Every 24 hours 03/29/18 0037 03/29/18 1616   03/28/18 2230  levofloxacin (LEVAQUIN)  IVPB 500 mg     500 mg 100 mL/hr over 60 Minutes Intravenous  Once 03/28/18 2227 03/29/18 0058     Subjective: -No events overnight, remains in sinus rhythm, -Breathing improving overall, oral intake also better  Objective: Vitals:   04/01/18 0453 04/01/18  0812 04/01/18 0935 04/01/18 1131  BP: (!) 150/98 (!) 145/85  (!) 133/97  Pulse: 89 100  73  Resp: 18   18  Temp: 98.7 F (37.1 C)   98.4 F (36.9 C)  TempSrc: Oral   Oral  SpO2: 96%  97% 96%  Weight: 63.5 kg     Height:        Intake/Output Summary (Last 24 hours) at 04/01/2018 1250 Last data filed at 04/01/2018 1207 Gross per 24 hour  Intake 60 ml  Output 1900 ml  Net -1840 ml   Filed Weights   03/30/18 0117 03/31/18 0124 04/01/18 0453  Weight: 69.7 kg 63.9 kg 63.5 kg    Examination:  Gen: Really frail chronically ill-appearing female aphasic, mumbles a few words appropriately HEENT: PERRLA, Neck supple, no JVD Lungs: Decreased breath sounds at both bases CVS: S1-S2/regular rate rhythm Abd: soft, Non tender, non distended, BS present Extremities: No edema Skin: no new rashes Neuro: Left hemiplegia and dysarthria at baseline  Data Reviewed: I have personally reviewed following labs and imaging studies  CBC: Recent Labs  Lab 03/28/18 2018 03/30/18 0421  WBC 8.4 6.5  NEUTROABS 6.1  --   HGB 11.4* 8.8*  HCT 36.5 28.6*  MCV 92.6 89.7  PLT 222 638   Basic Metabolic Panel: Recent Labs  Lab 03/28/18 2018 03/29/18 0420 03/30/18 0421 03/31/18 0526 04/01/18 0435  NA 144 144 143 142 141  K 3.9 5.2* 3.5 3.8 3.4*  CL 111 112* 110 108 108  CO2 22 21* 24 24 23   GLUCOSE 106* 84 92 92 79  BUN 26* 25* 27* 23 18  CREATININE 1.09* 0.92 1.11* 1.06* 1.03*  CALCIUM 9.4 9.0 9.0 9.1 8.7*  MG  --  1.9 1.7 2.2  --    GFR: Estimated Creatinine Clearance: 40.8 mL/min (A) (by C-G formula based on SCr of 1.03 mg/dL (H)). Liver Function Tests: Recent Labs  Lab 03/28/18 2018 03/30/18 0421  AST 21 17  ALT 12 14  ALKPHOS 41 38  BILITOT 0.2* 0.4  PROT 6.7 5.8*  ALBUMIN 2.8* 2.4*   No results for input(s): LIPASE, AMYLASE in the last 168 hours. No results for input(s): AMMONIA in the last 168 hours. Coagulation Profile: Recent Labs  Lab 03/29/18 0420  INR 1.35    Cardiac Enzymes: Recent Labs  Lab 03/28/18 2018 03/29/18 0420 03/29/18 1004 03/29/18 1630  TROPONINI 0.31* 0.27* 0.31* 0.33*   BNP (last 3 results) No results for input(s): PROBNP in the last 8760 hours. HbA1C: No results for input(s): HGBA1C in the last 72 hours. CBG: Recent Labs  Lab 03/29/18 2107  GLUCAP 93   Lipid Profile: No results for input(s): CHOL, HDL, LDLCALC, TRIG, CHOLHDL, LDLDIRECT in the last 72 hours. Thyroid Function Tests: No results for input(s): TSH, T4TOTAL, FREET4, T3FREE, THYROIDAB in the last 72 hours. Anemia Panel: No results for input(s): VITAMINB12, FOLATE, FERRITIN, TIBC, IRON, RETICCTPCT in the last 72 hours. Sepsis Labs: No results for input(s): PROCALCITON, LATICACIDVEN in the last 168 hours.  Recent Results (from the past 240 hour(s))  Respiratory Panel by PCR     Status: None   Collection Time: 03/29/18 12:55 AM  Result Value Ref Range  Status   Adenovirus NOT DETECTED NOT DETECTED Final   Coronavirus 229E NOT DETECTED NOT DETECTED Final   Coronavirus HKU1 NOT DETECTED NOT DETECTED Final   Coronavirus NL63 NOT DETECTED NOT DETECTED Final   Coronavirus OC43 NOT DETECTED NOT DETECTED Final   Metapneumovirus NOT DETECTED NOT DETECTED Final   Rhinovirus / Enterovirus NOT DETECTED NOT DETECTED Final   Influenza A NOT DETECTED NOT DETECTED Final   Influenza B NOT DETECTED NOT DETECTED Final   Parainfluenza Virus 1 NOT DETECTED NOT DETECTED Final   Parainfluenza Virus 2 NOT DETECTED NOT DETECTED Final   Parainfluenza Virus 3 NOT DETECTED NOT DETECTED Final   Parainfluenza Virus 4 NOT DETECTED NOT DETECTED Final   Respiratory Syncytial Virus NOT DETECTED NOT DETECTED Final   Bordetella pertussis NOT DETECTED NOT DETECTED Final   Chlamydophila pneumoniae NOT DETECTED NOT DETECTED Final   Mycoplasma pneumoniae NOT DETECTED NOT DETECTED Final    Comment: Performed at [facility], [place] [address] [place], [place] [address]          Radiology Studies: Dg Swallowing Func-speech Pathology  Result Date: 03/31/2018 Objective Swallowing Evaluation: Type of Study: MBS-Modified Barium Swallow Study  Patient Details Name: HEAVAN FRANCOM MRN: [id number] Date of Birth: [birth year] Today's Date: 03/31/2018 Time: SLP Start Time (ACUTE ONLY): 1330 -SLP Stop Time (ACUTE ONLY): 1400 SLP Time Calculation (min) (ACUTE ONLY): 30 min Past Medical History: Past Medical History: Diagnosis Date . Cardiomyopathy   a. EF of 25% in 07/2006 b. EF normalized by repeat echo in 2011 c. EF 35-40% by echo in 05/2017 with cath showing mild nonobstructive CAD . Chronic combined systolic and diastolic CHF (congestive heart failure) ([place])  . Colon polyps  . Diverticulosis of colon  . Hypertension  . Hypertensive heart disease  . Osteopenia  . Stroke ([place])  . TIA (transient ischemic attack)  . Tobacco abuse   50 pack year Past Surgical History: Past Surgical History: Procedure Laterality Date . BREAST BIOPSY   . CARPAL TUNNEL RELEASE    right hand . CERVICAL FUSION   . CESAREAN SECTION    2 times . COLONOSCOPY  1914  pt uncertain as to whether polypectomy required or performed . ECTOPIC PREGNANCY SURGERY   . IR ANGIO VERTEBRAL SEL SUBCLAVIAN INNOMINATE UNI R MOD SED  02/10/2018 . IR CT HEAD LTD  02/10/2018 . IR PERCUTANEOUS ART THROMBECTOMY/INFUSION INTRACRANIAL INC DIAG ANGIO  02/10/2018 . RADIOLOGY WITH ANESTHESIA N/A 02/10/2018  Procedure: RADIOLOGY WITH ANESTHESIA;  Surgeon: Luanne Bras, MD;  Location: [facility];  Service: Radiology;  Laterality: N/A; . RIGHT/LEFT HEART CATH AND CORONARY ANGIOGRAPHY N/A 06/03/2017  Procedure: RIGHT/LEFT HEART CATH AND CORONARY ANGIOGRAPHY;  Surgeon: Jettie Booze, MD;  Location: [facility];  Service: Cardiovascular;  Laterality: N/A; . TEE WITHOUT CARDIOVERSION N/A 02/13/2018  Procedure: TRANSESOPHAGEAL ECHOCARDIOGRAM (TEE);  Surgeon: Sueanne Margarita, MD;  Location: [facility] ENDOSCOPY;  Service: Cardiovascular;   Laterality: N/A; . TONSILLECTOMY   HPI: LADAVIA LINDENBAUM is a 78 y.o. female with medical history significant for combined systolic and diastolic heart failure (last EF 40%), multiple CVA with most recent being right MCA stroke in November of 2019 with residual left-sided hemiparesis, ACDF C5-7 (2007?), undocumented COPD and hypertension who presented to the ED from her skilled nursing facility with shortness of breath x2 days and nonproductive cough.  She has been at a skilled nursing facility since her discharge from the hospital after a large territory right MCA stroke in November  2019, she was discharged on December 18 to a SNF.  Pt is known to this service having been evaluated in acute setting with continued ST services in IPR for both dysphagia and cognitive-linguistic impairments.  Pt was upgraded to regular diet with thin liquid prior to discharge.  Subjective: alert, husband present for study Assessment / Plan / Recommendation CHL IP CLINICAL IMPRESSIONS 03/31/2018 Clinical Impression Pt presented with a primary oral dysphagia with decreased sensation, decreased bolus cohesion, piecemeal bolusing with solids and liquids.  Pt demonstrated consistent and reliable airway protection with adequate laryngeal vestibule closure, no penetration/aspiration, good pharyngeal clearance.  There were trace residuals of solids that backflowed from below to superior to UES, but the amount was negligible.  There was no difficulty swallowing a 13 mm barium pill when consumed with applesauce.  Recommend continuing a regular diet with thin liquids; meds no longer need to be crushed.   SLP Visit Diagnosis Dysphagia, oral phase (R13.11) Attention and concentration deficit following -- Frontal lobe and executive function deficit following -- Impact on safety and function Mild aspiration risk   CHL IP TREATMENT RECOMMENDATION 03/31/2018 Treatment Recommendations No treatment recommended at this time   Prognosis 02/14/2018 Prognosis  for Safe Diet Advancement Fair Barriers to Reach Goals -- Barriers/Prognosis Comment -- CHL IP DIET RECOMMENDATION 03/31/2018 SLP Diet Recommendations Regular solids;Thin liquid Liquid Administration via Cup;Straw Medication Administration Whole meds with puree Compensations Minimize environmental distractions;Small sips/bites;Lingual sweep for clearance of pocketing Postural Changes Remain semi-upright after after feeds/meals (Comment)   CHL IP OTHER RECOMMENDATIONS 03/31/2018 Recommended Consults -- Oral Care Recommendations Oral care BID Other Recommendations --   CHL IP FOLLOW UP RECOMMENDATIONS 03/31/2018 Follow up Recommendations Skilled Nursing facility   [facility] IP FREQUENCY AND DURATION 02/15/2018 Speech Therapy Frequency (ACUTE ONLY) min 2x/week Treatment Duration --      CHL IP ORAL PHASE 03/31/2018 Oral Phase Impaired Oral - Pudding Teaspoon -- Oral - Pudding Cup -- Oral - Honey Teaspoon -- Oral - Honey Cup -- Oral - Nectar Teaspoon -- Oral - Nectar Cup -- Oral - Nectar Straw -- Oral - Thin Teaspoon -- Oral - Thin Cup -- Oral - Thin Straw Piecemeal swallowing;Lingual/palatal residue;Decreased bolus cohesion Oral - Puree Lingual/palatal residue;Piecemeal swallowing;Decreased bolus cohesion;Delayed oral transit Oral - Mech Soft Lingual/palatal residue;Piecemeal swallowing;Delayed oral transit;Decreased bolus cohesion Oral - Regular -- Oral - Multi-Consistency -- Oral - Pill -- Oral Phase - Comment --  CHL IP PHARYNGEAL PHASE 03/31/2018 Pharyngeal Phase WFL Pharyngeal- Pudding Teaspoon -- Pharyngeal -- Pharyngeal- Pudding Cup -- Pharyngeal -- Pharyngeal- Honey Teaspoon -- Pharyngeal -- Pharyngeal- Honey Cup -- Pharyngeal -- Pharyngeal- Nectar Teaspoon -- Pharyngeal -- Pharyngeal- Nectar Cup -- Pharyngeal -- Pharyngeal- Nectar Straw -- Pharyngeal -- Pharyngeal- Thin Teaspoon -- Pharyngeal -- Pharyngeal- Thin Cup -- Pharyngeal -- Pharyngeal- Thin Straw -- Pharyngeal -- Pharyngeal- Puree -- Pharyngeal -- Pharyngeal-  Mechanical Soft -- Pharyngeal -- Pharyngeal- Regular -- Pharyngeal -- Pharyngeal- Multi-consistency -- Pharyngeal -- Pharyngeal- Pill -- Pharyngeal -- Pharyngeal Comment --  CHL IP CERVICAL ESOPHAGEAL PHASE 02/14/2018 Cervical Esophageal Phase WFL Pudding Teaspoon -- Pudding Cup -- Honey Teaspoon -- Honey Cup -- Nectar Teaspoon -- Nectar Cup -- Nectar Straw -- Thin Teaspoon -- Thin Cup -- Thin Straw -- Puree -- Mechanical Soft -- Regular -- Multi-consistency -- Pill -- Cervical Esophageal Comment -- [name] 03/31/2018, 2:14 PM                   Scheduled Meds: . apixaban  5  mg Oral BID  . arformoterol  15 mcg Nebulization BID  . atorvastatin  40 mg Oral q1800  . carvedilol  25 mg Oral BID WC  . donepezil  5 mg Oral QHS  . furosemide  40 mg Oral BID  . hydrALAZINE  10 mg Oral Q8H  . ipratropium-albuterol  3 mL Nebulization BID  . isosorbide mononitrate  15 mg Oral Daily  . potassium chloride  40 mEq Oral BID  . [START ON 04/02/2018] potassium chloride  40 mEq Oral Daily  . sodium chloride flush  3 mL Intravenous Q12H   Continuous Infusions: . sodium chloride       LOS: 4 days    Time spent: 32min    Domenic Polite, MD [facility] Page via [email or web address]  If 7PM-7AM, please contact night-coverage [email or web address] Password [email or web address] 04/01/2018, 12:50 PM

## 2018-04-02 LAB — BASIC METABOLIC PANEL
Anion gap: 9 (ref 5–15)
BUN: 14 mg/dL (ref 8–23)
CO2: 28 mmol/L (ref 22–32)
Calcium: 8.9 mg/dL (ref 8.9–10.3)
Chloride: 103 mmol/L (ref 98–111)
Creatinine, Ser: 1.1 mg/dL — ABNORMAL HIGH (ref 0.44–1.00)
GFR calc Af Amer: 57 mL/min — ABNORMAL LOW (ref >60)
GFR calc non Af Amer: 49 mL/min — ABNORMAL LOW (ref >60)
Glucose, Bld: 86 mg/dL (ref 70–99)
POTASSIUM: 3.5 mmol/L (ref 3.5–5.1)
Sodium: 140 mmol/L (ref 135–145)

## 2018-04-02 MED ORDER — HYDRALAZINE HCL 10 MG PO TABS
10.0000 mg | ORAL_TABLET | Freq: Three times a day (TID) | ORAL | Status: DC
  Administered 2018-04-02 – 2018-04-04 (×6): 10 mg via ORAL
  Filled 2018-04-02 (×7): qty 1

## 2018-04-02 NOTE — Progress Notes (Addendum)
PROGRESS NOTE    Brandy [name]  OZD:[id number] DOB: [birth year] DOA: 03/28/2018 PCP: [name], MD   Brief Narrative:  Brandy [name] is a 78 y.o. female with medical history significant for combined systolic and diastolic heart failure - EF 30%,  multiple CVAs with most recent being right MCA stroke in November of 2019 with residual left-sided hemiparesis, undocumented COPD and hypertension who presented to the ED from her skilled nursing facility with shortness of breath x2 days and nonproductive cough.  She has been at a skilled nursing facility since her discharge from the hospital after a large territory right MCA stroke in November 2019, she was discharged on December 18 to a SNF. -Since admission she was treated for A. fib with RVR and acute on chronic diastolic CHF -She has since converted to sinus rhythm, transition from aspirin/Plavix over to Eliquis and diuresed with IV Lasix, followed by Cardiology -Improving and stabilizing now, discharge planning in progress  Assessment & Plan:   Atrial Fibrillation with RVR:  -Given recent stroke and Chadvasc of  at least 7 -After discussion with cardiology and neurology ( Dr.Xu) she was transitioned from aspirin and Plavix over to Eliquis -She converted to sinus rhythm, Coreg dose was increased to 25 mg twice daily -Heart rate remains stable at this time on current regimen  Acute on chronic systolic and diastolic CHF  -She was felt to be mildly volume overloaded on admission diuresed with IV Lasix, -Echo from 11/201 9 with EF of 30 35% moderate diffuse hypokinesis, repeat echo noted EF of 25% -Clinically appears close to euvolemic, currently transitioned to oral Lasix 40 mg twice daily  -4 L negative this admission -She was followed by cardiology this admission, started on low-dose Imdur and hydralazine for afterload reduction, ACE inhibitors/ARB avoided given history of angioedema from ACE, Cards has now signed off , outpatient  follow-up with Dr. Bronson Ing recommended  -Family extremely anxious given cardiac complications and quick admission since stroke  History of recurrent CVA -Most recent with left hemiplegia/ flaccidity -Currently in SNF for rehab, aspirin/Plavix was changed to apixaban, continue statin -See discussion on anticoagulation above -Status post modified barium swallow, currently on dysphagia 3 diet with thin liquids  Elevated troponin -Suspect secondary to demand from A. fib RVR and mild diastolic CHF -She had nonobstructive CAD on cardiac cath last year -Per cardiology no further cardiac work-up needed at this time -Continue statin and Coreg  Pyuria -Asymptomatic, although history limited, monitor off antibiotics  Abnormal chest x-ray -She had a repeat chest x-ray 4days ago which showed improved aeration without convincing evidence of residual consolidation -Antibiotics were discontinued 5 days ago by my partner, I agree there is no indication to continue this at this time  Chronic normocytic anemia -Stable from previous admissions, defer to PCP for outpatient work-up -hb stable  DVT prophylaxis: eliquis Code Status: full  Family Communication: Discussed with spouse at bedside  Disposition Plan: Discharge planning, social work consulted, plan for SNF for short-term rehab, family now also considering [facility] with [facility], brother from [place] arriving tomorrow  Consultants:   cardiology  Procedures:   Antimicrobials:  Anti-infectives (From admission, onward)   Start     Dose/Rate Route Frequency Ordered Stop   03/31/18 1000  azithromycin (ZITHROMAX) tablet 250 mg  Status:  Discontinued     250 mg Oral Daily 03/29/18 0037 03/29/18 1616   03/30/18 0600  azithromycin (ZITHROMAX) tablet 500 mg  Status:  Discontinued     500 mg  Oral Daily 03/29/18 0037 03/29/18 1616   03/29/18 0600  cefTRIAXone (ROCEPHIN) 1 g in sodium chloride 0.9 % 100 mL IVPB  Status:  Discontinued     1 g 200 mL/hr over 30  Minutes Intravenous Every 24 hours 03/29/18 0037 03/29/18 1616   03/28/18 2230  levofloxacin (LEVAQUIN) IVPB 500 mg     500 mg 100 mL/hr over 60 Minutes Intravenous  Once 03/28/18 2227 03/29/18 0058     Subjective: -No events overnight, breathing overall improving -remains in sinus rhythm -Oral intake is fair  Objective: Vitals:   04/01/18 2135 04/02/18 0616 04/02/18 1037 04/02/18 1240  BP: 128/83 127/72  101/81  Pulse: 78 69  (!) 59  Resp: 20 18  18   Temp: 98.9 F (37.2 C) 98.5 F (36.9 C)  98.6 F (37 C)  TempSrc: Oral Oral  Oral  SpO2: 98% 95% 91% 99%  Weight:  63 kg    Height:        Intake/Output Summary (Last 24 hours) at 04/02/2018 1328 Last data filed at 04/02/2018 0900 Gross per 24 hour  Intake 600 ml  Output 1950 ml  Net -1350 ml   Filed Weights   03/31/18 0124 04/01/18 0453 04/02/18 0616  Weight: 63.9 kg 63.5 kg 63 kg    Examination:  Gen: Frail, chronically ill-appearing female dysarthric, mumbles a few words appropriately, alert awake oriented x2   HEENT: PERRLA, Neck supple, no JVD Lungs: Improved air movement, decreased breath sounds at both bases CVS: RRR,No Gallops,Rubs or new Murmurs Abd: soft, Non tender, non distended, BS present Extremities: No edema Skin: no new rashes Neuro: Left hemiplegia and dysarthria at baseline  Data Reviewed: I have personally reviewed following labs and imaging studies  CBC: Recent Labs  Lab 03/28/18 2018 03/30/18 0421  WBC 8.4 6.5  NEUTROABS 6.1  --   HGB 11.4* 8.8*  HCT 36.5 28.6*  MCV 92.6 89.7  PLT 222 347   Basic Metabolic Panel: Recent Labs  Lab 03/29/18 0420 03/30/18 0421 03/31/18 0526 04/01/18 0435 04/02/18 0530  NA 144 143 142 141 140  K 5.2* 3.5 3.8 3.4* 3.5  CL 112* 110 108 108 103  CO2 21* 24 24 23 28   GLUCOSE 84 92 92 79 86  BUN 25* 27* 23 18 14   CREATININE 0.92 1.11* 1.06* 1.03* 1.10*  CALCIUM 9.0 9.0 9.1 8.7* 8.9  MG 1.9 1.7 2.2  --   --    GFR: Estimated Creatinine Clearance:  38.2 mL/min (A) (by C-G formula based on SCr of 1.1 mg/dL (H)). Liver Function Tests: Recent Labs  Lab 03/28/18 2018 03/30/18 0421  AST 21 17  ALT 12 14  ALKPHOS 41 38  BILITOT 0.2* 0.4  PROT 6.7 5.8*  ALBUMIN 2.8* 2.4*   No results for input(s): LIPASE, AMYLASE in the last 168 hours. No results for input(s): AMMONIA in the last 168 hours. Coagulation Profile: Recent Labs  Lab 03/29/18 0420  INR 1.35   Cardiac Enzymes: Recent Labs  Lab 03/28/18 2018 03/29/18 0420 03/29/18 1004 03/29/18 1630  TROPONINI 0.31* 0.27* 0.31* 0.33*   BNP (last 3 results) No results for input(s): PROBNP in the last 8760 hours. HbA1C: No results for input(s): HGBA1C in the last 72 hours. CBG: Recent Labs  Lab 03/29/18 2107  GLUCAP 93   Lipid Profile: No results for input(s): CHOL, HDL, LDLCALC, TRIG, CHOLHDL, LDLDIRECT in the last 72 hours. Thyroid Function Tests: No results for input(s): TSH, T4TOTAL, FREET4, T3FREE, THYROIDAB in the  last 72 hours. Anemia Panel: No results for input(s): VITAMINB12, FOLATE, FERRITIN, TIBC, IRON, RETICCTPCT in the last 72 hours. Sepsis Labs: No results for input(s): PROCALCITON, LATICACIDVEN in the last 168 hours.  Recent Results (from the past 240 hour(s))  Respiratory Panel by PCR     Status: None   Collection Time: 03/29/18 12:55 AM  Result Value Ref Range Status   Adenovirus NOT DETECTED NOT DETECTED Final   Coronavirus 229E NOT DETECTED NOT DETECTED Final   Coronavirus HKU1 NOT DETECTED NOT DETECTED Final   Coronavirus NL63 NOT DETECTED NOT DETECTED Final   Coronavirus OC43 NOT DETECTED NOT DETECTED Final   Metapneumovirus NOT DETECTED NOT DETECTED Final   Rhinovirus / Enterovirus NOT DETECTED NOT DETECTED Final   Influenza A NOT DETECTED NOT DETECTED Final   Influenza B NOT DETECTED NOT DETECTED Final   Parainfluenza Virus 1 NOT DETECTED NOT DETECTED Final   Parainfluenza Virus 2 NOT DETECTED NOT DETECTED Final   Parainfluenza Virus 3 NOT  DETECTED NOT DETECTED Final   Parainfluenza Virus 4 NOT DETECTED NOT DETECTED Final   Respiratory Syncytial Virus NOT DETECTED NOT DETECTED Final   Bordetella pertussis NOT DETECTED NOT DETECTED Final   Chlamydophila pneumoniae NOT DETECTED NOT DETECTED Final   Mycoplasma pneumoniae NOT DETECTED NOT DETECTED Final    Comment: Performed at [facility], [address] [place], [place] [address]         Radiology Studies: Dg Swallowing Func-speech Pathology  Result Date: 03/31/2018 Objective Swallowing Evaluation: Type of Study: MBS-Modified Barium Swallow Study  Patient Details Name: Brandy [name] MRN: [id number] Date of Birth: 1940/06/24 Today's Date: 03/31/2018 Time: SLP Start Time (ACUTE ONLY): 1330 -SLP Stop Time (ACUTE ONLY): 1400 SLP Time Calculation (min) (ACUTE ONLY): 30 min Past Medical History: Past Medical History: Diagnosis Date . Cardiomyopathy   a. EF of 25% in 07/2006 b. EF normalized by repeat echo in 2011 c. EF 35-40% by echo in 05/2017 with cath showing mild nonobstructive CAD . Chronic combined systolic and diastolic CHF (congestive heart failure) ([place])  . Colon polyps  . Diverticulosis of colon  . Hypertension  . Hypertensive heart disease  . Osteopenia  . Stroke ([place])  . TIA (transient ischemic attack)  . Tobacco abuse   50 pack year Past Surgical History: Past Surgical History: Procedure Laterality Date . BREAST BIOPSY   . CARPAL TUNNEL RELEASE    right hand . CERVICAL FUSION   . CESAREAN SECTION    2 times . COLONOSCOPY  7169  pt uncertain as to whether polypectomy required or performed . ECTOPIC PREGNANCY SURGERY   . IR ANGIO VERTEBRAL SEL SUBCLAVIAN INNOMINATE UNI R MOD SED  02/10/2018 . IR CT HEAD LTD  02/10/2018 . IR PERCUTANEOUS ART THROMBECTOMY/INFUSION INTRACRANIAL INC DIAG ANGIO  02/10/2018 . RADIOLOGY WITH ANESTHESIA N/A 02/10/2018  Procedure: RADIOLOGY WITH ANESTHESIA;  Surgeon: Luanne Bras, MD;  Location: [facility];  Service: Radiology;  Laterality: N/A; .  RIGHT/LEFT HEART CATH AND CORONARY ANGIOGRAPHY N/A 06/03/2017  Procedure: RIGHT/LEFT HEART CATH AND CORONARY ANGIOGRAPHY;  Surgeon: Jettie Booze, MD;  Location: [facility];  Service: Cardiovascular;  Laterality: N/A; . TEE WITHOUT CARDIOVERSION N/A 02/13/2018  Procedure: TRANSESOPHAGEAL ECHOCARDIOGRAM (TEE);  Surgeon: Sueanne Margarita, MD;  Location: [facility] ENDOSCOPY;  Service: Cardiovascular;  Laterality: N/A; . TONSILLECTOMY   HPI: Brandy [name] is a 78 y.o. female with medical history significant for combined systolic and diastolic heart failure (last EF 40%), multiple CVA with  most recent being right MCA stroke in November of 2019 with residual left-sided hemiparesis, ACDF C5-7 (2007?), undocumented COPD and hypertension who presented to the ED from her skilled nursing facility with shortness of breath x2 days and nonproductive cough.  She has been at a skilled nursing facility since her discharge from the hospital after a large territory right MCA stroke in November 2019, she was discharged on December 18 to a SNF.  Pt is known to this service having been evaluated in acute setting with continued ST services in IPR for both dysphagia and cognitive-linguistic impairments.  Pt was upgraded to regular diet with thin liquid prior to discharge.  Subjective: alert, husband present for study Assessment / Plan / Recommendation CHL IP CLINICAL IMPRESSIONS 03/31/2018 Clinical Impression Pt presented with a primary oral dysphagia with decreased sensation, decreased bolus cohesion, piecemeal bolusing with solids and liquids.  Pt demonstrated consistent and reliable airway protection with adequate laryngeal vestibule closure, no penetration/aspiration, good pharyngeal clearance.  There were trace residuals of solids that backflowed from below to superior to UES, but the amount was negligible.  There was no difficulty swallowing a 13 mm barium pill when consumed with applesauce.  Recommend continuing a regular diet  with thin liquids; meds no longer need to be crushed.   SLP Visit Diagnosis Dysphagia, oral phase (R13.11) Attention and concentration deficit following -- Frontal lobe and executive function deficit following -- Impact on safety and function Mild aspiration risk   CHL IP TREATMENT RECOMMENDATION 03/31/2018 Treatment Recommendations No treatment recommended at this time   Prognosis 02/14/2018 Prognosis for Safe Diet Advancement Fair Barriers to Reach Goals -- Barriers/Prognosis Comment -- CHL IP DIET RECOMMENDATION 03/31/2018 SLP Diet Recommendations Regular solids;Thin liquid Liquid Administration via Cup;Straw Medication Administration Whole meds with puree Compensations Minimize environmental distractions;Small sips/bites;Lingual sweep for clearance of pocketing Postural Changes Remain semi-upright after after feeds/meals (Comment)   CHL IP OTHER RECOMMENDATIONS 03/31/2018 Recommended Consults -- Oral Care Recommendations Oral care BID Other Recommendations --   CHL IP FOLLOW UP RECOMMENDATIONS 03/31/2018 Follow up Recommendations Skilled Nursing facility   [facility] IP FREQUENCY AND DURATION 02/15/2018 Speech Therapy Frequency (ACUTE ONLY) min 2x/week Treatment Duration --      CHL IP ORAL PHASE 03/31/2018 Oral Phase Impaired Oral - Pudding Teaspoon -- Oral - Pudding Cup -- Oral - Honey Teaspoon -- Oral - Honey Cup -- Oral - Nectar Teaspoon -- Oral - Nectar Cup -- Oral - Nectar Straw -- Oral - Thin Teaspoon -- Oral - Thin Cup -- Oral - Thin Straw Piecemeal swallowing;Lingual/palatal residue;Decreased bolus cohesion Oral - Puree Lingual/palatal residue;Piecemeal swallowing;Decreased bolus cohesion;Delayed oral transit Oral - Mech Soft Lingual/palatal residue;Piecemeal swallowing;Delayed oral transit;Decreased bolus cohesion Oral - Regular -- Oral - Multi-Consistency -- Oral - Pill -- Oral Phase - Comment --  CHL IP PHARYNGEAL PHASE 03/31/2018 Pharyngeal Phase WFL Pharyngeal- Pudding Teaspoon -- Pharyngeal -- Pharyngeal- Pudding  Cup -- Pharyngeal -- Pharyngeal- Honey Teaspoon -- Pharyngeal -- Pharyngeal- Honey Cup -- Pharyngeal -- Pharyngeal- Nectar Teaspoon -- Pharyngeal -- Pharyngeal- Nectar Cup -- Pharyngeal -- Pharyngeal- Nectar Straw -- Pharyngeal -- Pharyngeal- Thin Teaspoon -- Pharyngeal -- Pharyngeal- Thin Cup -- Pharyngeal -- Pharyngeal- Thin Straw -- Pharyngeal -- Pharyngeal- Puree -- Pharyngeal -- Pharyngeal- Mechanical Soft -- Pharyngeal -- Pharyngeal- Regular -- Pharyngeal -- Pharyngeal- Multi-consistency -- Pharyngeal -- Pharyngeal- Pill -- Pharyngeal -- Pharyngeal Comment --  CHL IP CERVICAL ESOPHAGEAL PHASE 02/14/2018 Cervical Esophageal Phase WFL Pudding Teaspoon -- Pudding Cup -- Honey Teaspoon -- Honey Cup --  Nectar Teaspoon -- Nectar Cup -- Nectar Straw -- Thin Teaspoon -- Thin Cup -- Thin Straw -- Puree -- Mechanical Soft -- Regular -- Multi-consistency -- Pill -- Cervical Esophageal Comment -- [name] 03/31/2018, 2:14 PM                   Scheduled Meds: . apixaban  5 mg Oral BID  . arformoterol  15 mcg Nebulization BID  . atorvastatin  40 mg Oral q1800  . carvedilol  25 mg Oral BID WC  . donepezil  5 mg Oral QHS  . furosemide  40 mg Oral BID  . hydrALAZINE  10 mg Oral Q8H  . ipratropium-albuterol  3 mL Nebulization BID  . isosorbide mononitrate  15 mg Oral Daily  . potassium chloride  40 mEq Oral Daily  . sodium chloride flush  3 mL Intravenous Q12H  . spironolactone  12.5 mg Oral Daily   Continuous Infusions: . sodium chloride       LOS: 5 days    Time spent: 51min    Domenic Polite, MD [facility] Page via [email or web address]  If 7PM-7AM, please contact night-coverage [email or web address] Password [email or web address] 04/02/2018, 1:28 PM

## 2018-04-03 ENCOUNTER — Encounter (HOSPITAL_COMMUNITY): Payer: Medicare Other

## 2018-04-03 DIAGNOSIS — Z8673 Personal history of transient ischemic attack (TIA), and cerebral infarction without residual deficits: Secondary | ICD-10-CM

## 2018-04-03 LAB — CBC
HCT: 32.2 % — ABNORMAL LOW (ref 36.0–46.0)
Hemoglobin: 10.1 g/dL — ABNORMAL LOW (ref 12.0–15.0)
MCH: 27.4 pg (ref 26.0–34.0)
MCHC: 31.4 g/dL (ref 30.0–36.0)
MCV: 87.3 fL (ref 80.0–100.0)
NRBC: 0 % (ref 0.0–0.2)
Platelets: 401 10*3/uL — ABNORMAL HIGH (ref 150–400)
RBC: 3.69 MIL/uL — ABNORMAL LOW (ref 3.87–5.11)
RDW: 15 % (ref 11.5–15.5)
WBC: 8 10*3/uL (ref 4.0–10.5)

## 2018-04-03 LAB — BASIC METABOLIC PANEL
Anion gap: 10 (ref 5–15)
BUN: 14 mg/dL (ref 8–23)
CO2: 27 mmol/L (ref 22–32)
Calcium: 9 mg/dL (ref 8.9–10.3)
Chloride: 101 mmol/L (ref 98–111)
Creatinine, Ser: 1.09 mg/dL — ABNORMAL HIGH (ref 0.44–1.00)
GFR calc Af Amer: 58 mL/min — ABNORMAL LOW (ref >60)
GFR calc non Af Amer: 50 mL/min — ABNORMAL LOW (ref >60)
Glucose, Bld: 82 mg/dL (ref 70–99)
Potassium: 3.5 mmol/L (ref 3.5–5.1)
SODIUM: 138 mmol/L (ref 135–145)

## 2018-04-03 NOTE — Progress Notes (Signed)
Physical Therapy Treatment Patient Details Name: Brandy [name] MRN: [id number] DOB: Oct 23, 1940 Today's Date: 04/03/2018    History of Present Illness Brandy [name] is a 78 y.o. female with medical history significant for combined systolic and diastolic heart failure (last EF 40%), multiple CVA with most recent being right MCA stroke in November of 2019 with residual left-sided hemiparesis, undocumented COPD and hypertension who presented to the ED from her skilled nursing facility with shortness of breath x2 days and nonproductive cough.  She has been at a skilled nursing facility since her discharge from the hospital after a large territory right MCA stroke in November 2019, she was discharged on December 18 to a SNF.      PT Comments    Pt admitted with above diagnosis. Pt currently with functional limitations due to the deficits listed below (see PT Problem List). Pt was able to sit EOB with min guard to mod assist at times.  Pt does utilize her right UE to help her balance and can sit for up to a minute but as she fatigues, she can be a mod assist to sit EOB.  Pt tends to push/extend right hemibody instead of flex. Will continue acute PT.  Pt will benefit from skilled PT to increase their independence and safety with mobility to allow discharge to the venue listed below.     Follow Up Recommendations  SNF;Supervision/Assistance - 24 hour     Equipment Recommendations  Other (comment)(TBA)    Recommendations for Other Services       Precautions / Restrictions Precautions Precautions: Fall Precaution Comments: mild Lt inattention, left hemiplegia, pusher to the left, motor impersistence Required Braces or Orthoses: Splint/Cast Splint/Cast: left UE splint(checked for redness, none noted) Restrictions Weight Bearing Restrictions: No    Mobility  Bed Mobility Overal bed mobility: Needs Assistance Bed Mobility: Rolling;Sidelying to Sit Rolling: Min assist;Max assist(min to left,  max to right) Sidelying to sit: Mod assist;+2 for physical assistance;Max assist       General bed mobility comments: Needed assist and cues to roll and reach for rail to be cleaned as her purewick leaked.  Pt then needed assist to raise trunk once on her side as well as assist to move LES off bed.    Transfers                    Ambulation/Gait                 Stairs             Wheelchair Mobility    Modified Rankin (Stroke Patients Only)       Balance Overall balance assessment: Needs assistance Sitting-balance support: Single extremity supported;Feet supported Sitting balance-Leahy Scale: Fair Sitting balance - Comments: Can sit with right UE supported as long as she doesn't start pushing with min guard assist for up to a minute.  At times needed up to mod assist when she loses balance.  Loses balance to left especially with fatigue. Performed some right LE exercises and sat a total of 12 minutes EOB.                                     Cognition Arousal/Alertness: Awake/alert Behavior During Therapy: Flat affect Overall Cognitive Status: Impaired/Different from baseline Area of Impairment: Safety/judgement;Problem solving  Safety/Judgement: Decreased awareness of safety;Decreased awareness of deficits   Problem Solving: Decreased initiation;Difficulty sequencing;Requires verbal cues;Slow processing        Exercises      General Comments        Pertinent Vitals/Pain Faces Pain Scale: No hurt    Home Living                      Prior Function            PT Goals (current goals can now be found in the care plan section) Acute Rehab PT Goals Patient Stated Goal: to go to Rehab Progress towards PT goals: Progressing toward goals    Frequency    Min 2X/week      PT Plan Current plan remains appropriate    Co-evaluation              AM-PAC PT "6 Clicks"  Mobility   Outcome Measure  Help needed turning from your back to your side while in a flat bed without using bedrails?: A Lot Help needed moving from lying on your back to sitting on the side of a flat bed without using bedrails?: A Lot Help needed moving to and from a bed to a chair (including a wheelchair)?: Total Help needed standing up from a chair using your arms (e.g., wheelchair or bedside chair)?: Total Help needed to walk in hospital room?: Total Help needed climbing 3-5 steps with a railing? : Total 6 Click Score: 8    End of Session Equipment Utilized During Treatment: Gait belt Activity Tolerance: Patient limited by fatigue Patient left: with call Rotz/phone within reach;with family/visitor present;in bed(with pt in chair position) Nurse Communication: Mobility status;Need for lift equipment PT Visit Diagnosis: Unsteadiness on feet (R26.81);Muscle weakness (generalized) (M62.81);Hemiplegia and hemiparesis Hemiplegia - Right/Left: Left Hemiplegia - dominant/non-dominant: Non-dominant Hemiplegia - caused by: Cerebral infarction     Time: 1441-1510 PT Time Calculation (min) (ACUTE ONLY): 29 min  Charges:  $Therapeutic Activity: 8-22 mins $Self Care/Home Management: 8-22                     [place] Pager:  [phone]  Office:  [place] 04/03/2018, 3:51 PM

## 2018-04-03 NOTE — Clinical Social Work Note (Addendum)
[facility] is unable to offer a bed. Care needs exceeding facility capability. Husband also interested in looking into [facility] and [facility]. [facility] has female beds available. Asked admissions coordinator to review referral. Left message for [facility] admissions coordinator to review referral as well.  Dayton Scrape, [place] [phone]  12:16 pm [facility] unable to offer a bed as patient appears to be more long-term appropriate. [facility] admissions coordinator will notify CSW of decision after her meeting.  Dayton Scrape, [place] [phone]  3:56 pm [facility] is able to offer a bed and will start insurance authorization. Typically takes 24 hours. Patient and husband are aware and agreeable.  Dayton Scrape, [place]

## 2018-04-03 NOTE — Progress Notes (Signed)
Patient ID: Brandy [name], female   DOB: 1940/11/03, 78 y.o.   MRN: [id number]  PROGRESS NOTE    Brandy [name]  FOY:[id number] DOB: 08-28-1940 DOA: 03/28/2018 PCP: [name], MD   Brief Narrative:  78 year old female with history of combined systolic and diastolic heart failure with EF of 30%, multiple CVAs with most recent being right MCA stroke in November 2019 with residual left-sided hemiparesis, undocumented COPD, hypertension presented on 03/28/2018 from SNF with shortness of breath and cough.  She was found to have A. fib with RVR and acute on chronic diastolic CHF.  Cardiology was consulted.   Assessment & Plan:   Active Problems:   Acute exacerbation of congestive heart failure (HCC)   Atrial fibrillation with rapid ventricular rate -Patient was transitioned from aspirin and Plavix over to Eliquis after discussion with cardiology and neurology/Dr. Erlinda Hong by prior hospitalist -Patient converted to sinus rhythm -Currently rate controlled.  Continue Coreg.  Outpatient follow-up with cardiology  Acute on chronic systolic and diastolic CHF -Echo from 76/7209 showed EF of 30 to 35% with moderate diffuse hypokinesis.  Repeat echo noted to have EF of 25% -Cardiology evaluated the patient and has signed off.  Continue oral Lasix along with spironolactone, Coreg and hydralazine.  Not on ACE inhibitor secondary to angioedema from ACE.  Strict input and output.  Daily weights.  Fluid restriction.  Negative balance of 5000.3 cc since admission.  Outpatient follow-up with cardiology  History of recurrent CVA -Most recent with left hemiplegia/flaccidity  -Continue statin.  Aspirin/Plavix was changed to Eliquis -Continue diet as per SLP recommendations  Elevated troponin -Secondary to demand ischemia from A. fib with RVR and CHF exacerbation.  She had nonobstructive CAD on cardiac cath last year.  No further cardiac work-up needed at this time as per cardiology.  Continue statin and  Coreg  Pyuria -Asymptomatic.  Monitor off antibiotics  Chronic normocytic anemia -Stable.  Monitor   DVT prophylaxis: Eliquis Code Status: Full Family Communication: Spoke to husband at bedside Disposition Plan: SNF once bed is available.  Consultants: Cardiology  Procedures:  Echo on 03/30/2018 Study Conclusions  - Left ventricle: The cavity size was mildly dilated. Wall   thickness was increased in a pattern of moderate LVH. Systolic   function was severely reduced. The estimated ejection fraction   was in the range of 20% to 25%. Diffuse hypokinesis. Features are   consistent with a pseudonormal left ventricular filling pattern,   with concomitant abnormal relaxation and increased filling   pressure (grade 2 diastolic dysfunction). Doppler parameters are   consistent with high ventricular filling pressure. - Mitral valve: Calcified annulus. Mildly thickened leaflets .   There was mild to moderate regurgitation. - Left atrium: The atrium was mildly dilated. - Pulmonary arteries: Systolic pressure was moderately increased.   PA peak pressure: 49 mm Hg (S).  Impressions:  - Severe global reduction in LV systolic function; moderate   diastolic dysfunction with elevated LV filling pressure; moderate   LVH; mild LVE; mild to moderate MR; mild LAE; mild TR with   moderate pulmonary hypertension.  Antimicrobials:  Rocephin and Levaquin on 03/28/2018   Subjective: Patient seen and examined at bedside.  She is awake, poor historian.  No overnight fever or vomiting.  Oral intake is not that good as per patient's husband at bedside.  Objective: Vitals:   04/03/18 0532 04/03/18 0851 04/03/18 0912 04/03/18 1132  BP: 133/67 124/87  (!) 86/62  Pulse: 73 88  84  Resp: 18   15  Temp: 98.3 F (36.8 C) 98.1 F (36.7 C)  98 F (36.7 C)  TempSrc: Oral Oral  Oral  SpO2: 97%  92% 91%  Weight: 60.3 kg     Height:        Intake/Output Summary (Last 24 hours) at 04/03/2018  1214 Last data filed at 04/03/2018 1138 Gross per 24 hour  Intake 360 ml  Output 1650 ml  Net -1290 ml   Filed Weights   04/01/18 0453 04/02/18 0616 04/03/18 0532  Weight: 63.5 kg 63 kg 60.3 kg    Examination:  General exam: Elderly female lying in bed.  No distress.  Awake but poor historian Respiratory system: Bilateral decreased breath sounds at bases, no wheezing Cardiovascular system: S1 & S2 heard, Rate controlled Gastrointestinal system: Abdomen is nondistended, soft and nontender. Normal bowel sounds heard. Extremities: No cyanosis, clubbing; trace edema  Data Reviewed: I have personally reviewed following labs and imaging studies  CBC: Recent Labs  Lab 03/28/18 2018 03/30/18 0421 04/03/18 0616  WBC 8.4 6.5 8.0  NEUTROABS 6.1  --   --   HGB 11.4* 8.8* 10.1*  HCT 36.5 28.6* 32.2*  MCV 92.6 89.7 87.3  PLT 222 212 300*   Basic Metabolic Panel: Recent Labs  Lab 03/29/18 0420 03/30/18 0421 03/31/18 0526 04/01/18 0435 04/02/18 0530 04/03/18 0616  NA 144 143 142 141 140 138  K 5.2* 3.5 3.8 3.4* 3.5 3.5  CL 112* 110 108 108 103 101  CO2 21* 24 24 23 28 27   GLUCOSE 84 92 92 79 86 82  BUN 25* 27* 23 18 14 14   CREATININE 0.92 1.11* 1.06* 1.03* 1.10* 1.09*  CALCIUM 9.0 9.0 9.1 8.7* 8.9 9.0  MG 1.9 1.7 2.2  --   --   --    GFR: Estimated Creatinine Clearance: 38.5 mL/min (A) (by C-G formula based on SCr of 1.09 mg/dL (H)). Liver Function Tests: Recent Labs  Lab 03/28/18 2018 03/30/18 0421  AST 21 17  ALT 12 14  ALKPHOS 41 38  BILITOT 0.2* 0.4  PROT 6.7 5.8*  ALBUMIN 2.8* 2.4*   No results for input(s): LIPASE, AMYLASE in the last 168 hours. No results for input(s): AMMONIA in the last 168 hours. Coagulation Profile: Recent Labs  Lab 03/29/18 0420  INR 1.35   Cardiac Enzymes: Recent Labs  Lab 03/28/18 2018 03/29/18 0420 03/29/18 1004 03/29/18 1630  TROPONINI 0.31* 0.27* 0.31* 0.33*   BNP (last 3 results) No results for input(s): PROBNP in  the last 8760 hours. HbA1C: No results for input(s): HGBA1C in the last 72 hours. CBG: Recent Labs  Lab 03/29/18 2107  GLUCAP 93   Lipid Profile: No results for input(s): CHOL, HDL, LDLCALC, TRIG, CHOLHDL, LDLDIRECT in the last 72 hours. Thyroid Function Tests: No results for input(s): TSH, T4TOTAL, FREET4, T3FREE, THYROIDAB in the last 72 hours. Anemia Panel: No results for input(s): VITAMINB12, FOLATE, FERRITIN, TIBC, IRON, RETICCTPCT in the last 72 hours. Sepsis Labs: No results for input(s): PROCALCITON, LATICACIDVEN in the last 168 hours.  Recent Results (from the past 240 hour(s))  Respiratory Panel by PCR     Status: None   Collection Time: 03/29/18 12:55 AM  Result Value Ref Range Status   Adenovirus NOT DETECTED NOT DETECTED Final   Coronavirus 229E NOT DETECTED NOT DETECTED Final   Coronavirus HKU1 NOT DETECTED NOT DETECTED Final   Coronavirus NL63 NOT DETECTED NOT DETECTED Final   Coronavirus OC43 NOT DETECTED NOT  DETECTED Final   Metapneumovirus NOT DETECTED NOT DETECTED Final   Rhinovirus / Enterovirus NOT DETECTED NOT DETECTED Final   Influenza A NOT DETECTED NOT DETECTED Final   Influenza B NOT DETECTED NOT DETECTED Final   Parainfluenza Virus 1 NOT DETECTED NOT DETECTED Final   Parainfluenza Virus 2 NOT DETECTED NOT DETECTED Final   Parainfluenza Virus 3 NOT DETECTED NOT DETECTED Final   Parainfluenza Virus 4 NOT DETECTED NOT DETECTED Final   Respiratory Syncytial Virus NOT DETECTED NOT DETECTED Final   Bordetella pertussis NOT DETECTED NOT DETECTED Final   Chlamydophila pneumoniae NOT DETECTED NOT DETECTED Final   Mycoplasma pneumoniae NOT DETECTED NOT DETECTED Final    Comment: Performed at [facility], [place] [address] [place], [place] [address]         Radiology Studies: No results found.      Scheduled Meds: . apixaban  5 mg Oral BID  . arformoterol  15 mcg Nebulization BID  . atorvastatin  40 mg Oral q1800  . carvedilol  25 mg Oral  BID WC  . donepezil  5 mg Oral QHS  . furosemide  40 mg Oral BID  . hydrALAZINE  10 mg Oral Q8H  . ipratropium-albuterol  3 mL Nebulization BID  . isosorbide mononitrate  15 mg Oral Daily  . potassium chloride  40 mEq Oral Daily  . sodium chloride flush  3 mL Intravenous Q12H  . spironolactone  12.5 mg Oral Daily   Continuous Infusions: . sodium chloride       LOS: 6 days        Aline August, MD [facility] Pager [phone]  If 7PM-7AM, please contact night-coverage [email or web address] Password [facility] 04/03/2018, 12:14 PM

## 2018-04-04 ENCOUNTER — Ambulatory Visit: Payer: Medicare Other | Admitting: Cardiovascular Disease

## 2018-04-04 LAB — BASIC METABOLIC PANEL
Anion gap: 9 (ref 5–15)
BUN: 16 mg/dL (ref 8–23)
CO2: 30 mmol/L (ref 22–32)
Calcium: 9 mg/dL (ref 8.9–10.3)
Chloride: 100 mmol/L (ref 98–111)
Creatinine, Ser: 1.11 mg/dL — ABNORMAL HIGH (ref 0.44–1.00)
GFR calc Af Amer: 56 mL/min — ABNORMAL LOW (ref >60)
GFR calc non Af Amer: 49 mL/min — ABNORMAL LOW (ref >60)
Glucose, Bld: 88 mg/dL (ref 70–99)
Potassium: 3.5 mmol/L (ref 3.5–5.1)
Sodium: 139 mmol/L (ref 135–145)

## 2018-04-04 LAB — CBC WITH DIFFERENTIAL/PLATELET
Abs Immature Granulocytes: 0.04 10*3/uL (ref 0.00–0.07)
Basophils Absolute: 0 10*3/uL (ref 0.0–0.1)
Basophils Relative: 1 %
Eosinophils Absolute: 0.1 10*3/uL (ref 0.0–0.5)
Eosinophils Relative: 1 %
HCT: 31.7 % — ABNORMAL LOW (ref 36.0–46.0)
Hemoglobin: 10.1 g/dL — ABNORMAL LOW (ref 12.0–15.0)
Immature Granulocytes: 1 %
Lymphocytes Relative: 24 %
Lymphs Abs: 2.2 10*3/uL (ref 0.7–4.0)
MCH: 27.6 pg (ref 26.0–34.0)
MCHC: 31.9 g/dL (ref 30.0–36.0)
MCV: 86.6 fL (ref 80.0–100.0)
Monocytes Absolute: 0.7 10*3/uL (ref 0.1–1.0)
Monocytes Relative: 8 %
Neutro Abs: 5.8 10*3/uL (ref 1.7–7.7)
Neutrophils Relative %: 65 %
Platelets: 411 10*3/uL — ABNORMAL HIGH (ref 150–400)
RBC: 3.66 MIL/uL — ABNORMAL LOW (ref 3.87–5.11)
RDW: 14.9 % (ref 11.5–15.5)
WBC: 8.8 10*3/uL (ref 4.0–10.5)
nRBC: 0 % (ref 0.0–0.2)

## 2018-04-04 LAB — MAGNESIUM: Magnesium: 1.7 mg/dL (ref 1.7–2.4)

## 2018-04-04 MED ORDER — FUROSEMIDE 40 MG PO TABS: 40.0000 mg | ORAL_TABLET | Freq: Two times a day (BID) | ORAL | 0 refills | Status: DC

## 2018-04-04 MED ORDER — HYDRALAZINE HCL 10 MG PO TABS: 10.0000 mg | ORAL_TABLET | Freq: Three times a day (TID) | ORAL | 0 refills | Status: DC

## 2018-04-04 MED ORDER — APIXABAN 5 MG PO TABS: 5.0000 mg | ORAL_TABLET | Freq: Two times a day (BID) | ORAL | 0 refills | Status: DC

## 2018-04-04 MED ORDER — TRAMADOL HCL 50 MG PO TABS: 50.0000 mg | ORAL_TABLET | Freq: Four times a day (QID) | ORAL | 0 refills | Status: DC | PRN

## 2018-04-04 MED ORDER — CARVEDILOL 12.5 MG PO TABS: 25.0000 mg | ORAL_TABLET | Freq: Two times a day (BID) | ORAL | Status: DC

## 2018-04-04 MED ORDER — ISOSORBIDE MONONITRATE ER 30 MG PO TB24: 15.0000 mg | ORAL_TABLET | Freq: Every day | ORAL | 0 refills | Status: DC

## 2018-04-04 MED ORDER — SPIRONOLACTONE 25 MG PO TABS: 12.5000 mg | ORAL_TABLET | Freq: Every day | ORAL | 0 refills | Status: DC

## 2018-04-04 MED ORDER — POTASSIUM CHLORIDE 20 MEQ PO PACK: 40.0000 meq | PACK | Freq: Every day | ORAL | 0 refills | Status: DC

## 2018-04-04 NOTE — Clinical Social Work Note (Signed)
CSW facilitated patient discharge including contacting patient family and facility to confirm patient discharge plans. Clinical information faxed to facility and family agreeable with plan. CSW arranged ambulance transport via [facility] to [facility]. RN to call report prior to discharge ([phone]).  CSW will sign off for now as social work intervention is no longer needed. Please consult us again if new needs arise.  Teegan Guinther, CSW [phone]  

## 2018-04-04 NOTE — Discharge Summary (Signed)
Physician Discharge Summary  Brandy [name]:[id number] DOB: [birth year] DOA: 03/28/2018  PCP: [name], MD  Admit date: 03/28/2018 Discharge date: 04/04/2018  Admitted From: SNF Disposition: SNF  Recommendations for Outpatient Follow-up:  1. Follow up with SNF provider at earliest convenience with repeat CBC/BMP in the next 3 to 5 days 2. Outpatient follow-up with cardiology 3. Follow-up in the ED if symptoms worsen or new appear 4. Outpatient follow-up with neurology   Home Health: No Equipment/Devices: None  Discharge Condition: Stable CODE STATUS: Full Diet recommendation: As per SLP recommendations  Brief/Interim Summary: 78 year old female with history of combined systolic and diastolic heart failure with EF of 30%, multiple CVAs with most recent being right MCA stroke in November 2019 with residual left-sided hemiparesis, undocumented COPD, hypertension presented on 03/28/2018 from SNF with shortness of breath and cough.  She was found to have A. fib with RVR and acute on chronic diastolic CHF.  Cardiology was consulted.  During the hospitalization, she converted to sinus rhythm.  She was started on Eliquis.  She has diuresed well.  Cardiology has adjusted medications for heart failure.  Cardiology has signed off and patient is medically stable for discharge to SNF once bed is available.  Discharge Diagnoses:  Active Problems:   Acute exacerbation of congestive heart failure (HCC)  Atrial fibrillation with rapid ventricular rate -Patient was transitioned from aspirin and Plavix over to Eliquis after discussion with cardiology and neurology/Dr. Erlinda Hong by prior hospitalist -Patient converted to sinus rhythm -Currently rate controlled.  Continue Coreg.  Outpatient follow-up with cardiology  Acute on chronic systolic and diastolic CHF -Echo from 40/1027 showed EF of 30 to 35% with moderate diffuse hypokinesis.  Repeat echo noted to have EF of 25% -Cardiology evaluated the  patient and has signed off.  Continue oral Lasix along with spironolactone, Coreg and hydralazine.  Not on ACE inhibitor secondary to angioedema from ACE.  Continue with Fluid restriction.  Negative balance of 5165 .3 cc since admission.  Outpatient follow-up with cardiology  History of recurrent CVA -Most recent with left hemiplegia/flaccidity  -Continue statin.  Aspirin/Plavix was changed to Eliquis -Continue diet as per SLP recommendations  Elevated troponin -Secondary to demand ischemia from A. fib with RVR and CHF exacerbation.  She had nonobstructive CAD on cardiac cath last year.  No further cardiac work-up needed at this time as per cardiology.  Continue statin and Coreg  Pyuria -Asymptomatic.  Monitor off antibiotics  Chronic normocytic anemia -Stable.  Monitor  Discharge Instructions  Discharge Instructions    Ambulatory referral to Cardiology   Complete by:  As directed    Call MD for:  difficulty breathing, headache or visual disturbances   Complete by:  As directed    Call MD for:  extreme fatigue   Complete by:  As directed    Call MD for:  persistant dizziness or light-headedness   Complete by:  As directed    Call MD for:  persistant nausea and vomiting   Complete by:  As directed    Call MD for:  temperature >100.4   Complete by:  As directed    Diet - low sodium heart healthy   Complete by:  As directed    Increase activity slowly   Complete by:  As directed      Allergies as of 04/04/2018      Reactions   Lisinopril Swelling      Medication List    STOP taking these medications   aspirin  325 MG tablet   clopidogrel 75 MG tablet Commonly known as:  PLAVIX     TAKE these medications   acetaminophen 325 MG tablet Commonly known as:  TYLENOL Take 2 tablets (650 mg total) by mouth every 4 (four) hours as needed for mild pain (or temp > 37.5 C (99.5 F)). What changed:  when to take this   apixaban 5 MG Tabs tablet Commonly known as:   ELIQUIS Take 1 tablet (5 mg total) by mouth 2 (two) times daily.   atorvastatin 10 MG tablet Commonly known as:  LIPITOR Take 1 tablet (10 mg total) by mouth daily at 6 PM.   carvedilol 12.5 MG tablet Commonly known as:  COREG Take 2 tablets (25 mg total) by mouth 2 (two) times daily with a meal. What changed:  how much to take   donepezil 5 MG tablet Commonly known as:  ARICEPT Take 1 tablet by mouth at bedtime.   furosemide 40 MG tablet Commonly known as:  LASIX Take 1 tablet (40 mg total) by mouth 2 (two) times daily.   hydrALAZINE 10 MG tablet Commonly known as:  APRESOLINE Take 1 tablet (10 mg total) by mouth every 8 (eight) hours.   isosorbide mononitrate 30 MG 24 hr tablet Commonly known as:  IMDUR Take 0.5 tablets (15 mg total) by mouth daily. Start taking on:  April 05, 2018   methylphenidate 5 MG tablet Commonly known as:  RITALIN Take 1 tablet (5 mg total) by mouth 2 (two) times daily with breakfast and lunch.   potassium chloride 20 MEQ packet Commonly known as:  KLOR-CON Take 40 mEq by mouth daily. Start taking on:  April 05, 2018   spironolactone 25 MG tablet Commonly known as:  ALDACTONE Take 0.5 tablets (12.5 mg total) by mouth daily. Start taking on:  April 05, 2018   traMADol 50 MG tablet Commonly known as:  ULTRAM Take 1 tablet (50 mg total) by mouth every 6 (six) hours as needed for moderate pain. What changed:    when to take this  reasons to take this   umeclidinium-vilanterol 62.5-25 MCG/INH Aepb Commonly known as:  ANORO ELLIPTA Inhale 1 puff into the lungs daily.      Contact information for after-discharge care    Destination    [facility] .   Service:  Skilled Nursing Contact information: [address] [place] [phone]             Allergies  Allergen Reactions  . Lisinopril Swelling    Consultations:  Cardiology   Procedures/Studies: Dg Chest 2  View  Result Date: 03/29/2018 CLINICAL DATA:  78 year old who had acute RIGHT MIDDLE cerebral artery occlusion with endovascular revascularization 02/10/2018. Follow-up possible LEFT lung base pneumonia. EXAM: CHEST - 2 VIEW COMPARISON:  03/28/2018, 02/15/2018 and earlier. FINDINGS: AP ERECT and LATERAL images were obtained. The patient was unable to raise the LEFT arm due to her prior stroke, partially obscuring the LATERAL image. Cardiac silhouette moderately enlarged, unchanged. Thoracic aorta mildly atherosclerotic, unchanged. Hilar and mediastinal contours otherwise unremarkable. Since the examination yesterday, improved aeration in the LEFT lung base without residual consolidation. Prominent bronchovascular markings diffusely and moderate to marked central peribronchial thickening, more so than on prior examinations. Normal pulmonary vascularity. No visible pleural effusions. Osseous demineralization. Degenerative changes throughout the thoracic spine. Cervicothoracic levoscoliosis and thoracolumbar dextroscoliosis is noted previously. IMPRESSION: 1. Improved aeration in the LEFT lung base without convincing evidence of residual consolidation. 2.  Moderate to severe changes of acute bronchitis and/or asthma without focal airspace pneumonia. 3. Stable cardiomegaly without pulmonary edema. Electronically Signed   By: Evangeline Dakin M.D.   On: 03/29/2018 14:23   Dg Chest Port 1 View  Result Date: 03/28/2018 CLINICAL DATA:  Shortness of breath for 2 days EXAM: PORTABLE CHEST 1 VIEW COMPARISON:  February 15, 2018 FINDINGS: The heart size and mediastinal contours are stable. Heart size is enlarged. There is mild patchy opacity of the medial left lung base. There is no pulmonary edema or pleural effusion. The visualized skeletal structures are stable. IMPRESSION: Mild patchy opacity of the medial left lung base, developing pneumonia is not excluded. Electronically Signed   By: Abelardo Diesel M.D.   On:  03/28/2018 21:12   Dg Swallowing Func-speech Pathology  Result Date: 03/31/2018 Objective Swallowing Evaluation: Type of Study: MBS-Modified Barium Swallow Study  Patient Details Name: TAMITHA NORELL MRN: [id number] Date of Birth: [birth year] Today's Date: 03/31/2018 Time: SLP Start Time (ACUTE ONLY): 1330 -SLP Stop Time (ACUTE ONLY): 1400 SLP Time Calculation (min) (ACUTE ONLY): 30 min Past Medical History: Past Medical History: Diagnosis Date . Cardiomyopathy   a. EF of 25% in 07/2006 b. EF normalized by repeat echo in 2011 c. EF 35-40% by echo in 05/2017 with cath showing mild nonobstructive CAD . Chronic combined systolic and diastolic CHF (congestive heart failure) ([place])  . Colon polyps  . Diverticulosis of colon  . Hypertension  . Hypertensive heart disease  . Osteopenia  . Stroke ([place])  . TIA (transient ischemic attack)  . Tobacco abuse   50 pack year Past Surgical History: Past Surgical History: Procedure Laterality Date . BREAST BIOPSY   . CARPAL TUNNEL RELEASE    right hand . CERVICAL FUSION   . CESAREAN SECTION    2 times . COLONOSCOPY  6283  pt uncertain as to whether polypectomy required or performed . ECTOPIC PREGNANCY SURGERY   . IR ANGIO VERTEBRAL SEL SUBCLAVIAN INNOMINATE UNI R MOD SED  02/10/2018 . IR CT HEAD LTD  02/10/2018 . IR PERCUTANEOUS ART THROMBECTOMY/INFUSION INTRACRANIAL INC DIAG ANGIO  02/10/2018 . RADIOLOGY WITH ANESTHESIA N/A 02/10/2018  Procedure: RADIOLOGY WITH ANESTHESIA;  Surgeon: Luanne Bras, MD;  Location: [facility];  Service: Radiology;  Laterality: N/A; . RIGHT/LEFT HEART CATH AND CORONARY ANGIOGRAPHY N/A 06/03/2017  Procedure: RIGHT/LEFT HEART CATH AND CORONARY ANGIOGRAPHY;  Surgeon: Jettie Booze, MD;  Location: [facility];  Service: Cardiovascular;  Laterality: N/A; . TEE WITHOUT CARDIOVERSION N/A 02/13/2018  Procedure: TRANSESOPHAGEAL ECHOCARDIOGRAM (TEE);  Surgeon: Sueanne Margarita, MD;  Location: [facility] ENDOSCOPY;  Service: Cardiovascular;  Laterality: N/A; .  TONSILLECTOMY   HPI: CLAUDINA OLIPHANT is a 78 y.o. female with medical history significant for combined systolic and diastolic heart failure (last EF 40%), multiple CVA with most recent being right MCA stroke in November of 2019 with residual left-sided hemiparesis, ACDF C5-7 (2007?), undocumented COPD and hypertension who presented to the ED from her skilled nursing facility with shortness of breath x2 days and nonproductive cough.  She has been at a skilled nursing facility since her discharge from the hospital after a large territory right MCA stroke in November 2019, she was discharged on December 18 to a SNF.  Pt is known to this service having been evaluated in acute setting with continued ST services in IPR for both dysphagia and cognitive-linguistic impairments.  Pt was upgraded to regular diet with thin liquid prior to discharge.  Subjective: alert, husband present  for study Assessment / Plan / Recommendation CHL IP CLINICAL IMPRESSIONS 03/31/2018 Clinical Impression Pt presented with a primary oral dysphagia with decreased sensation, decreased bolus cohesion, piecemeal bolusing with solids and liquids.  Pt demonstrated consistent and reliable airway protection with adequate laryngeal vestibule closure, no penetration/aspiration, good pharyngeal clearance.  There were trace residuals of solids that backflowed from below to superior to UES, but the amount was negligible.  There was no difficulty swallowing a 13 mm barium pill when consumed with applesauce.  Recommend continuing a regular diet with thin liquids; meds no longer need to be crushed.   SLP Visit Diagnosis Dysphagia, oral phase (R13.11) Attention and concentration deficit following -- Frontal lobe and executive function deficit following -- Impact on safety and function Mild aspiration risk   CHL IP TREATMENT RECOMMENDATION 03/31/2018 Treatment Recommendations No treatment recommended at this time   Prognosis 02/14/2018 Prognosis for Safe Diet  Advancement Fair Barriers to Reach Goals -- Barriers/Prognosis Comment -- CHL IP DIET RECOMMENDATION 03/31/2018 SLP Diet Recommendations Regular solids;Thin liquid Liquid Administration via Cup;Straw Medication Administration Whole meds with puree Compensations Minimize environmental distractions;Small sips/bites;Lingual sweep for clearance of pocketing Postural Changes Remain semi-upright after after feeds/meals (Comment)   CHL IP OTHER RECOMMENDATIONS 03/31/2018 Recommended Consults -- Oral Care Recommendations Oral care BID Other Recommendations --   CHL IP FOLLOW UP RECOMMENDATIONS 03/31/2018 Follow up Recommendations Skilled Nursing facility   [facility] IP FREQUENCY AND DURATION 02/15/2018 Speech Therapy Frequency (ACUTE ONLY) min 2x/week Treatment Duration --      CHL IP ORAL PHASE 03/31/2018 Oral Phase Impaired Oral - Pudding Teaspoon -- Oral - Pudding Cup -- Oral - Honey Teaspoon -- Oral - Honey Cup -- Oral - Nectar Teaspoon -- Oral - Nectar Cup -- Oral - Nectar Straw -- Oral - Thin Teaspoon -- Oral - Thin Cup -- Oral - Thin Straw Piecemeal swallowing;Lingual/palatal residue;Decreased bolus cohesion Oral - Puree Lingual/palatal residue;Piecemeal swallowing;Decreased bolus cohesion;Delayed oral transit Oral - Mech Soft Lingual/palatal residue;Piecemeal swallowing;Delayed oral transit;Decreased bolus cohesion Oral - Regular -- Oral - Multi-Consistency -- Oral - Pill -- Oral Phase - Comment --  CHL IP PHARYNGEAL PHASE 03/31/2018 Pharyngeal Phase WFL Pharyngeal- Pudding Teaspoon -- Pharyngeal -- Pharyngeal- Pudding Cup -- Pharyngeal -- Pharyngeal- Honey Teaspoon -- Pharyngeal -- Pharyngeal- Honey Cup -- Pharyngeal -- Pharyngeal- Nectar Teaspoon -- Pharyngeal -- Pharyngeal- Nectar Cup -- Pharyngeal -- Pharyngeal- Nectar Straw -- Pharyngeal -- Pharyngeal- Thin Teaspoon -- Pharyngeal -- Pharyngeal- Thin Cup -- Pharyngeal -- Pharyngeal- Thin Straw -- Pharyngeal -- Pharyngeal- Puree -- Pharyngeal -- Pharyngeal- Mechanical Soft --  Pharyngeal -- Pharyngeal- Regular -- Pharyngeal -- Pharyngeal- Multi-consistency -- Pharyngeal -- Pharyngeal- Pill -- Pharyngeal -- Pharyngeal Comment --  CHL IP CERVICAL ESOPHAGEAL PHASE 02/14/2018 Cervical Esophageal Phase WFL Pudding Teaspoon -- Pudding Cup -- Honey Teaspoon -- Honey Cup -- Nectar Teaspoon -- Nectar Cup -- Nectar Straw -- Thin Teaspoon -- Thin Cup -- Thin Straw -- Puree -- Mechanical Soft -- Regular -- Multi-consistency -- Pill -- Cervical Esophageal Comment -- [name] 03/31/2018, 2:14 PM               Echo on 03/30/2018 Study Conclusions  - Left ventricle: The cavity size was mildly dilated. Wall thickness was increased in a pattern of moderate LVH. Systolic function was severely reduced. The estimated ejection fraction was in the range of 20% to 25%. Diffuse hypokinesis. Features are consistent with a pseudonormal left ventricular filling pattern, with concomitant abnormal relaxation and increased filling pressure (grade  2 diastolic dysfunction). Doppler parameters are consistent with high ventricular filling pressure. - Mitral valve: Calcified annulus. Mildly thickened leaflets . There was mild to moderate regurgitation. - Left atrium: The atrium was mildly dilated. - Pulmonary arteries: Systolic pressure was moderately increased. PA peak pressure: 49 mm Hg (S).  Impressions:  - Severe global reduction in LV systolic function; moderate diastolic dysfunction with elevated LV filling pressure; moderate LVH; mild LVE; mild to moderate MR; mild LAE; mild TR with moderate pulmonary hypertension.   Subjective: Patient seen and examined at bedside.  She is awake, poor historian.  Denies any current pain or worsening shortness of breath.  Her appetite is not that good.  No overnight fever or vomiting reported by nursing staff.  Discharge Exam: Vitals:   04/04/18 0817 04/04/18 0851  BP:  126/66  Pulse:  85  Resp:  18  Temp:  99  F (37.2 C)  SpO2: 90% 98%   Vitals:   04/04/18 0400 04/04/18 0814 04/04/18 0817 04/04/18 0851  BP: 113/78   126/66  Pulse: 82   85  Resp: 20   18  Temp: 98.8 F (37.1 C)   99 F (37.2 C)  TempSrc: Oral   Oral  SpO2: 90% 90% 90% 98%  Weight: 57.1 kg     Height:        General: Elderly female lying in bed.  No distress.  Awake but poor historian.  Cardiovascular: rate controlled, S1/S2 + Respiratory: bilateral decreased breath sounds at bases, no wheezing Abdominal: Soft, NT, ND, bowel sounds + Extremities: Trace edema, no cyanosis    The results of significant diagnostics from this hospitalization (including imaging, microbiology, ancillary and laboratory) are listed below for reference.     Microbiology: Recent Results (from the past 240 hour(s))  Respiratory Panel by PCR     Status: None   Collection Time: 03/29/18 12:55 AM  Result Value Ref Range Status   Adenovirus NOT DETECTED NOT DETECTED Final   Coronavirus 229E NOT DETECTED NOT DETECTED Final   Coronavirus HKU1 NOT DETECTED NOT DETECTED Final   Coronavirus NL63 NOT DETECTED NOT DETECTED Final   Coronavirus OC43 NOT DETECTED NOT DETECTED Final   Metapneumovirus NOT DETECTED NOT DETECTED Final   Rhinovirus / Enterovirus NOT DETECTED NOT DETECTED Final   Influenza A NOT DETECTED NOT DETECTED Final   Influenza B NOT DETECTED NOT DETECTED Final   Parainfluenza Virus 1 NOT DETECTED NOT DETECTED Final   Parainfluenza Virus 2 NOT DETECTED NOT DETECTED Final   Parainfluenza Virus 3 NOT DETECTED NOT DETECTED Final   Parainfluenza Virus 4 NOT DETECTED NOT DETECTED Final   Respiratory Syncytial Virus NOT DETECTED NOT DETECTED Final   Bordetella pertussis NOT DETECTED NOT DETECTED Final   Chlamydophila pneumoniae NOT DETECTED NOT DETECTED Final   Mycoplasma pneumoniae NOT DETECTED NOT DETECTED Final    Comment: Performed at [facility], [address], [place], [place] [address]     Labs: BNP (last 3  results) Recent Labs    07/12/17 1759 09/13/17 1631 03/28/18 2018  BNP 642.0* 369.0* 967.8*   Basic Metabolic Panel: Recent Labs  Lab 03/29/18 0420 03/30/18 0421 03/31/18 0526 04/01/18 0435 04/02/18 0530 04/03/18 0616 04/04/18 0507  NA 144 143 142 141 140 138 139  K 5.2* 3.5 3.8 3.4* 3.5 3.5 3.5  CL 112* 110 108 108 103 101 100  CO2 21* 24 24 23 28 27 30   GLUCOSE 84 92 92 79 86 82 88  BUN 25* 27* 23 18 14 14 16   CREATININE 0.92 1.11* 1.06* 1.03* 1.10* 1.09* 1.11*  CALCIUM 9.0 9.0 9.1 8.7* 8.9 9.0 9.0  MG 1.9 1.7 2.2  --   --   --  1.7   Liver Function Tests: Recent Labs  Lab 03/28/18 2018 03/30/18 0421  AST 21 17  ALT 12 14  ALKPHOS 41 38  BILITOT 0.2* 0.4  PROT 6.7 5.8*  ALBUMIN 2.8* 2.4*   No results for input(s): LIPASE, AMYLASE in the last 168 hours. No results for input(s): AMMONIA in the last 168 hours. CBC: Recent Labs  Lab 03/28/18 2018 03/30/18 0421 04/03/18 0616 04/04/18 0507  WBC 8.4 6.5 8.0 8.8  NEUTROABS 6.1  --   --  5.8  HGB 11.4* 8.8* 10.1* 10.1*  HCT 36.5 28.6* 32.2* 31.7*  MCV 92.6 89.7 87.3 86.6  PLT 222 212 401* 411*   Cardiac Enzymes: Recent Labs  Lab 03/28/18 2018 03/29/18 0420 03/29/18 1004 03/29/18 1630  TROPONINI 0.31* 0.27* 0.31* 0.33*   BNP: Invalid input(s): POCBNP CBG: Recent Labs  Lab 03/29/18 2107  GLUCAP 93   D-Dimer No results for input(s): DDIMER in the last 72 hours. Hgb A1c No results for input(s): HGBA1C in the last 72 hours. Lipid Profile No results for input(s): CHOL, HDL, LDLCALC, TRIG, CHOLHDL, LDLDIRECT in the last 72 hours. Thyroid function studies No results for input(s): TSH, T4TOTAL, T3FREE, THYROIDAB in the last 72 hours.  Invalid input(s): FREET3 Anemia work up No results for input(s): VITAMINB12, FOLATE, FERRITIN, TIBC, IRON, RETICCTPCT in the last 72 hours. Urinalysis    Component Value Date/Time   COLORURINE YELLOW 03/29/2018 0145   APPEARANCEUR CLOUDY (A) 03/29/2018 0145    LABSPEC 1.027 03/29/2018 0145   PHURINE 6.0 03/29/2018 0145   GLUCOSEU NEGATIVE 03/29/2018 0145   HGBUR SMALL (A) 03/29/2018 0145   BILIRUBINUR NEGATIVE 03/29/2018 0145   KETONESUR NEGATIVE 03/29/2018 0145   PROTEINUR 30 (A) 03/29/2018 0145   NITRITE NEGATIVE 03/29/2018 0145   LEUKOCYTESUR LARGE (A) 03/29/2018 0145   Sepsis Labs Invalid input(s): PROCALCITONIN,  WBC,  LACTICIDVEN Microbiology Recent Results (from the past 240 hour(s))  Respiratory Panel by PCR     Status: None   Collection Time: 03/29/18 12:55 AM  Result Value Ref Range Status   Adenovirus NOT DETECTED NOT DETECTED Final   Coronavirus 229E NOT DETECTED NOT DETECTED Final   Coronavirus HKU1 NOT DETECTED NOT DETECTED Final   Coronavirus NL63 NOT DETECTED NOT DETECTED Final   Coronavirus OC43 NOT DETECTED NOT DETECTED Final   Metapneumovirus NOT DETECTED NOT DETECTED Final   Rhinovirus / Enterovirus NOT DETECTED NOT DETECTED Final   Influenza A NOT DETECTED NOT DETECTED Final   Influenza B NOT DETECTED NOT DETECTED Final   Parainfluenza Virus 1 NOT DETECTED NOT DETECTED Final   Parainfluenza Virus 2 NOT DETECTED NOT DETECTED Final   Parainfluenza Virus 3 NOT DETECTED NOT DETECTED Final   Parainfluenza Virus 4 NOT DETECTED NOT DETECTED Final   Respiratory Syncytial Virus NOT DETECTED NOT DETECTED Final   Bordetella pertussis NOT DETECTED NOT DETECTED Final   Chlamydophila pneumoniae NOT DETECTED NOT DETECTED Final   Mycoplasma pneumoniae NOT DETECTED NOT DETECTED Final    Comment: Performed at [facility], [place] [address], [place], [place] [address]     Time coordinating discharge: 35 minutes  SIGNED:   Aline August, MD  [facility] 04/04/2018, 9:22 AM Pager: [phone]  If 7PM-7AM, please contact night-coverage [email or web address] Password [email or web address]

## 2018-04-04 NOTE — Care Management Important Message (Signed)
Important Message  Patient Details  Name: Brandy [name] MRN: [id number] Date of Birth: May 25, 1940   Medicare Important Message Given:  Yes    [name] P [place] 04/04/2018, 2:21 PM

## 2018-04-04 NOTE — Clinical Social Work Placement (Signed)
   CLINICAL SOCIAL WORK PLACEMENT  NOTE  Date:  04/04/2018  Patient Details  Name: Brandy [name] MRN: [id number] Date of Birth: 1940-07-01  Clinical Social Work is seeking post-discharge placement for this patient at the   level of care (*CSW will initial, date and re-position this form in  chart as items are completed):      Patient/family provided with [facility] Work Department's list of facilities offering this level of care within the geographic area requested by the patient (or if unable, by the patient's family).      Patient/family informed of their freedom to choose among providers that offer the needed level of care, that participate in Medicare, Medicaid or managed care program needed by the patient, have an available bed and are willing to accept the patient.      Patient/family informed of [facility]'s ownership interest in [facility] and [facility], as well as of the fact that they are under no obligation to receive care at these facilities.  PASRR submitted to EDS on       PASRR number received on       Existing PASRR number confirmed on       FL2 transmitted to all facilities in geographic area requested by pt/family on       FL2 transmitted to all facilities within larger geographic area on       Patient informed that his/her managed care company has contracts with or will negotiate with certain facilities, including the following:        Yes   Patient/family informed of bed offers received.  Patient chooses bed at [facility]     Physician recommends and patient chooses bed at      Patient to be transferred to [facility] on 04/04/18.  Patient to be transferred to facility by [facility]     Patient family notified on 04/04/18 of transfer.  Name of family member notified:  Novella Olive and other family at bedside     PHYSICIAN       Additional Comment:    _______________________________________________ Candie Chroman,  LCSW 04/04/2018, 3:54 PM

## 2018-04-04 NOTE — Progress Notes (Signed)
Pt discharge to SNF ([facility]) RN called report to RN at facility and spoke with Lattie Haw.  [facility] transported patient.

## 2018-04-04 NOTE — Clinical Social Work Note (Addendum)
Sent discharge summary to SNF. Insurance authorization still pending.  Dayton Scrape, [place] [phone]  10:55 am Insurance authorization approved. SNF can take patient after lunch.  Dayton Scrape, [place] [phone]  11:05 am CSW called and updated husband. He wants to see the facility before CSW sets up transport. Husband stated this is rushing things and wanted to know if she could go tomorrow. CSW reiterated conversation from yesterday that all we were waiting on was insurance approval. Patient's husband does not plan on getting to hospital until 2:00. CSW made him aware that patient has to be at [facility] by 5:00 or they cannot take her today.  Dayton Scrape, CSW [phone]  12:14 pm Met patient's brother. He will call CSW when he and patient's husband are at facility to confirm transport can be arranged. Notified him that patient has a private room at the facility.  Dayton Scrape, [place] [phone]  2:23 pm Per nurse secretary, patient's husband and brother left around 1:25 to go to [facility]. Left message for admissions coordinator to see if there are any updates.  Dayton Scrape, [place] [phone]  2:44 pm Received call from husband. He stated [facility] told them they would hold her bed until tomorrow. CSW notified him again that her discharge orders are in but he appeared surprised this time. Patient's husband asking about appeals process. CSW notified him that if the appeal rules in favor of the hospital, they will be responsible for the bill. CSW confirmed this with RNCM as well. They will be here in about 30 minutes to discuss. MD aware.  Dayton Scrape, CSW [phone]  3:07 pm Met with patient and family at bedside. They wanted to speak with MD about patient not being discharged today. Brother stated they felt they did not have enough time to prepare for discharge. Reiterated that MD and CSW told husband yesterday she would be ready as soon as we got insurance  approval today. MD stated there is no medical reason to discontinue the discharge orders. Daughter said facility told them they would put an air mattress on the bed due to wounds. CSW spoke with admissions coordinator. They have one on site and just have to put it on the bed.  Dayton Scrape, [place] [phone]  3:50 pm Husband and family are agreeable to [facility] setting up transport to [facility]. Paged MD to notify.  Dayton Scrape, [place]

## 2018-04-19 ENCOUNTER — Encounter: Payer: Self-pay | Admitting: Adult Health

## 2018-04-19 ENCOUNTER — Ambulatory Visit: Payer: Medicare Other | Admitting: Adult Health

## 2018-04-19 VITALS — BP 97/77 | HR 91 | Ht 64.5 in

## 2018-04-19 DIAGNOSIS — G8194 Hemiplegia, unspecified affecting left nondominant side: Secondary | ICD-10-CM

## 2018-04-19 DIAGNOSIS — I63511 Cerebral infarction due to unspecified occlusion or stenosis of right middle cerebral artery: Secondary | ICD-10-CM | POA: Diagnosis not present

## 2018-04-19 DIAGNOSIS — I6601 Occlusion and stenosis of right middle cerebral artery: Secondary | ICD-10-CM | POA: Diagnosis not present

## 2018-04-19 DIAGNOSIS — E785 Hyperlipidemia, unspecified: Secondary | ICD-10-CM

## 2018-04-19 DIAGNOSIS — I480 Paroxysmal atrial fibrillation: Secondary | ICD-10-CM | POA: Diagnosis not present

## 2018-04-19 NOTE — Patient Instructions (Addendum)
Continue Eliquis (apixaban) daily  and lipitor  for secondary stroke prevention  Call Dr. Bronson Ing office to schedule appointment - he will continue to monitor blood pressure along with managing atrial fibrillation  [facility] [place]. [place], [place] [address] [phone]  Follow up with vascular surgery as recommended for re-imaging of vessels - referral will be placed and you will be called to schedule  Request to monitor for depression post stroke along with ensuring adequate nutritional intake  Continue to follow up with PCP regarding cholesterol and blood pressure management - SBP goal 130-150 due to intracranial stenosis  Continue PT/OT/ST for continued deficits  Continue to monitor blood pressure at home  Maintain strict control of hypertension with blood pressure goal 130-150, diabetes with hemoglobin A1c goal below 6.5% and cholesterol with LDL cholesterol (bad cholesterol) goal below 70 mg/dL. I also advised the patient to eat a healthy diet with plenty of whole grains, cereals, fruits and vegetables, exercise regularly and maintain ideal body weight.  Followup in the future with me in 3 months or call earlier if needed       Thank you for coming to see [place] at [facility]. I hope we have been able to provide you high quality care today.  You may receive a patient satisfaction survey over the next few weeks. We would appreciate your feedback and comments so that we may continue to improve ourselves and the health of our patients.

## 2018-04-19 NOTE — Progress Notes (Signed)
[facility] [address] [place]. [place] [address] [phone]       OFFICE FOLLOW UP NOTE  Ms. MAKAYIA DUPLESSIS Date of Birth:  08/30/1940 Medical Record Number:  [id number]   Reason for Referral:  hospital stroke follow up  CHIEF COMPLAINT:  Chief Complaint  Patient presents with  . Follow-up    Hospital Stroke follow up room 9 patient at [facility] facility pt in wheelchair pt with [name] her husband and brother Ron a MD    HPI: JULITZA RICKLES is being seen today for initial visit in the office for right MCA infarct with right M1 occlusion with failed IR likely due to cardioembolic source on 61/60/7371. History obtained from patient, husband, brother and chart review. Reviewed all radiology images and labs personally.  Ms. ANACAREN KOHAN is a 78 y.o. female with history of tobacco use, previous TIA, hypertension, cardiomyopathy with an EF of 25%, and congestive heart failure, and recent stroke on 06/2017 at [facility] with left MCA infarct with left M2 occlusion and right M1 distal high-grade stenosis.  Patient presented to [facility] with left-sided weakness and right gaze preference. No tPA given due to late presentation.  She was transferred to [facility] for thrombectomy as CTA showed right M1 occlusion CT head showed hyperdense right M1 MCA. Mechanical thrombectomy attempted but failed- minimal recanalizatiobn of occluded RT MCA M1 seg.  MRI brain reviewed and showed large right MCA infarcts including BG, CR and punctate cortical infarcts.  MRA showed right M1 occlusion with some flow in right M2 branches.  CT head showed right MCA infarct with right M1 hyperdense signal.  CTA head and neck showed right M1 MCA occlusion but previous left M2 occlusion resolved (indicating embolic pattern).  CT perfusion shows significant mismatch volume of 93 mL encompassing much of the right MCA territory.  2D echo showed an EF of 40%.  TEE showed an EF of 40% and unable to rule  out early forming thrombus in LAA versus artifact from posterior wall of the appendage with multiple runs of nonsustained A. tach and PACs noted during the procedure.  Recommended 30-day cardiac event monitor at discharge.  LDL 52 and A1c 4.7.  Recommended continuation of atorvastatin for HTN management.  Recommended long-term BP goal 1 30-1 50 given large vessel occlusion.  Additional stroke risk factors include advanced age, former tobacco use and CAD.  Recommended DAPT with aspirin and Plavix.  Due to continued deficits, patient was discharged to SNF for continuation of PT/OT/ST.  Patient return to ED on 03/28/2018 with shortness of breath and cough and was found to have A. fib with RVR and acute on chronic diastolic CHF with repeat echo showing EF of 25%.Marland Kitchen  She was diuresed and cardiology adjusted medications for heart failure along with initiating Eliquis and was discharged back to SNF in stable condition.  Patient is being seen today for hospital follow-up and is accompanied by her husband and brother.  She is currently residing at [facility] where she continues to receive PT/OT/ST.  She does continue to have residual stroke deficit of left hemiplegia and dysarthria.  She is currently sitting in wheelchair as she is nonambulatory. She was previously living at home with her husband independently doing all ADLs and IADLs.  She continues on Eliquis without side effects of bleeding or bruising.  Continues on atorvastatin without side effects myalgias.  Blood pressure today 97/77.  Brother stated that furosemide would be held if SBP less  than 100.  Cardiology appointment was initially scheduled but as she was hospitalized on second admission, appointment was canceled.  Has been plans on calling cardiology office to reschedule this appointment.  Brother also has concerns regarding patient's lack of appetite and continued weight loss.  Per prior cardiology notes, this was a concern prior to her stroke and it  was recommended to follow-up with PCP in this regards.  Patient has not had scheduled follow-up with PCP at this time as she is currently being managed by SNF provider. She denies depression at this time. Denies new or worsening stroke/TIA symptoms.    ROS:   14 system review of systems performed and negative with exception of activity change, appetite change, fatigue, unexpected weight change,   PMH:  Past Medical History:  Diagnosis Date  . Cardiomyopathy    a. EF of 25% in 07/2006 b. EF normalized by repeat echo in 2011 c. EF 35-40% by echo in 05/2017 with cath showing mild nonobstructive CAD  . Chronic combined systolic and diastolic CHF (congestive heart failure) ([place])   . Colon polyps   . Diverticulosis of colon   . Hypertension   . Hypertensive heart disease   . Osteopenia   . Stroke ([place])   . TIA (transient ischemic attack)   . Tobacco abuse    50 pack year    PSH:  Past Surgical History:  Procedure Laterality Date  . BREAST BIOPSY    . CARPAL TUNNEL RELEASE     right hand  . CERVICAL FUSION    . CESAREAN SECTION     2 times  . COLONOSCOPY  7591   pt uncertain as to whether polypectomy required or performed  . ECTOPIC PREGNANCY SURGERY    . IR ANGIO VERTEBRAL SEL SUBCLAVIAN INNOMINATE UNI R MOD SED  02/10/2018  . IR CT HEAD LTD  02/10/2018  . IR PERCUTANEOUS ART THROMBECTOMY/INFUSION INTRACRANIAL INC DIAG ANGIO  02/10/2018  . RADIOLOGY WITH ANESTHESIA N/A 02/10/2018   Procedure: RADIOLOGY WITH ANESTHESIA;  Surgeon: Luanne Bras, MD;  Location: [facility];  Service: Radiology;  Laterality: N/A;  . RIGHT/LEFT HEART CATH AND CORONARY ANGIOGRAPHY N/A 06/03/2017   Procedure: RIGHT/LEFT HEART CATH AND CORONARY ANGIOGRAPHY;  Surgeon: Jettie Booze, MD;  Location: [facility];  Service: Cardiovascular;  Laterality: N/A;  . TEE WITHOUT CARDIOVERSION N/A 02/13/2018   Procedure: TRANSESOPHAGEAL ECHOCARDIOGRAM (TEE);  Surgeon: Sueanne Margarita, MD;  Location: [facility]  ENDOSCOPY;  Service: Cardiovascular;  Laterality: N/A;  . TONSILLECTOMY      Social History:  Social History   Socioeconomic History  . Marital status: Married    Spouse name: Not on file  . Number of children: Not on file  . Years of education: Not on file  . Highest education level: Not on file  Occupational History  . Occupation: Former Product manager: RETIRED  Social Needs  . Financial resource strain: Not on file  . Food insecurity:    Worry: Not on file    Inability: Not on file  . Transportation needs:    Medical: Not on file    Non-medical: Not on file  Tobacco Use  . Smoking status: Former Smoker    Packs/day: 1.00    Years: 50.00    Pack years: 50.00    Types: Cigarettes    Start date: 09/02/1960    Last attempt to quit: 03/29/2008    Years since quitting: 10.0  . Smokeless tobacco: Never Used  Substance and Sexual Activity  . Alcohol use: No    Alcohol/week: 0.0 standard drinks  . Drug use: Never  . Sexual activity: Not Currently  Lifestyle  . Physical activity:    Days per week: Not on file    Minutes per session: Not on file  . Stress: Not on file  Relationships  . Social connections:    Talks on phone: Not on file    Gets together: Not on file    Attends religious service: Not on file    Active member of club or organization: Not on file    Attends meetings of clubs or organizations: Not on file    Relationship status: Not on file  . Intimate partner violence:    Fear of current or ex partner: Not on file    Emotionally abused: Not on file    Physically abused: Not on file    Forced sexual activity: Not on file  Other Topics Concern  . Not on file  Social History Narrative   Married with 2 adult children, resides with spouse     Family History:  Family History  Problem Relation Age of Onset  . Hypertension Brother   . Hypertension Maternal Grandmother   . Cancer Paternal Grandmother     Medications:   Current Outpatient  Medications on File Prior to Visit  Medication Sig Dispense Refill  . acetaminophen (TYLENOL) 325 MG tablet Take 2 tablets (650 mg total) by mouth every 4 (four) hours as needed for mild pain (or temp > 37.5 C (99.5 F)). (Patient taking differently: Take 650 mg by mouth 4 (four) times daily. )    . apixaban (ELIQUIS) 5 MG TABS tablet Take 1 tablet (5 mg total) by mouth 2 (two) times daily. 60 tablet 0  . atorvastatin (LIPITOR) 10 MG tablet Take 1 tablet (10 mg total) by mouth daily at 6 PM. 30 tablet 2  . carvedilol (COREG) 12.5 MG tablet Take 2 tablets (25 mg total) by mouth 2 (two) times daily with a meal.    . donepezil (ARICEPT) 5 MG tablet Take 1 tablet by mouth at bedtime.  1  . furosemide (LASIX) 40 MG tablet Take 1 tablet (40 mg total) by mouth 2 (two) times daily. 60 tablet 0  . hydrALAZINE (APRESOLINE) 10 MG tablet Take 1 tablet (10 mg total) by mouth every 8 (eight) hours. 30 tablet 0  . isosorbide mononitrate (IMDUR) 30 MG 24 hr tablet Take 0.5 tablets (15 mg total) by mouth daily. 30 tablet 0  . methylphenidate (RITALIN) 5 MG tablet Take 1 tablet (5 mg total) by mouth 2 (two) times daily with breakfast and lunch. 6 tablet 0  . potassium chloride (KLOR-CON) 20 MEQ packet Take 40 mEq by mouth daily. 30 packet 0  . spironolactone (ALDACTONE) 25 MG tablet Take 0.5 tablets (12.5 mg total) by mouth daily. 15 tablet 0  . traMADol (ULTRAM) 50 MG tablet Take 1 tablet (50 mg total) by mouth every 6 (six) hours as needed for moderate pain. 14 tablet 0  . umeclidinium-vilanterol (ANORO ELLIPTA) 62.5-25 MCG/INH AEPB Inhale 1 puff into the lungs daily. 60 each 5   No current facility-administered medications on file prior to visit.     Allergies:   Allergies  Allergen Reactions  . Lisinopril Swelling     Physical Exam  Vitals:   04/19/18 1504  BP: 97/77  Pulse: 91  Height: 5' 4.5" (1.638 m)   Body mass index is 21.28 kg/m.  No exam data present  General: well developed, well  nourished, pleasant elderly female, seated, in no evident distress Head: head normocephalic and atraumatic.   Neck: supple with no carotid or supraclavicular bruits Cardiovascular: regular rate and rhythm, no murmurs Musculoskeletal: no deformity Skin:  no rash/petichiae Vascular:  Normal pulses all extremities  Neurologic Exam Mental Status: Awake and fully alert.  Mild dysarthria.  Oriented to place and time. Recent and remote memory intact. Attention span, concentration and fund of knowledge appropriate. Mood and affect appropriate.  Cranial Nerves: Fundoscopic exam reveals sharp disc margins. Pupils equal, briskly reactive to light. Extraocular movements full without nystagmus. Visual fields full to confrontation. Hearing intact. Facial sensation intact.  Left lower facial paralysis. Motor: Normal bulk and tone.  LUE: 0/5; LLE: 0/5 Sensory.:  Decreased sensation left upper and lower extremity compared to right upper and lower extremity Coordination: Rapid alternating movements normal on right side. Finger-to-nose and heel-to-shin performed accurately on right side Gait and Station: Patient nonambulatory Reflexes: 1+ and symmetric. Toes downgoing.    NIHSS  11 Modified Rankin  4-5 CHA2DS2-VASc 9 HAS-BLED 2   Diagnostic Data (Labs, Imaging, Testing)  CT HEAD WO CONTRAST 02/10/2018 IMPRESSION: 1. Mixed pattern of both acute and suspected subacute RIGHT hemisphere ischemia, in the setting of hyperdense RIGHT M1 MCA and acute LEFT-sided weakness, consistent with emergent large vessel occlusion of the RIGHT proximal MCA. No hemorrhage. 2. ASPECTS is 7.  CT ANGIO HEAD W OR WO CONTRAST CT ANGIO NECK W OR WO CONTRAST CT CEREBRAL PERFUSION 02/10/2018 IMPRESSION: Acute RIGHT M1 MCA occlusion.  Fair to good collateralization.  No extracranial stenosis/dissection or carotid siphon stenosis.  Significant mismatch volume of 93 mL, encompassing much of the RIGHT MCA territory. See  discussion above.  MR BRAIN WO CONTRAST MR MRA HEAD  02/11/2018 IMPRESSION: 1. Persistent occlusion of the right middle cerebral artery M1 segment. There is flow related enhancement within the right M2 branches. 2. Large area infarction within the right middle cerebral artery territory, predominantly involving the anterior insula and the basal ganglia. 3. Petechial hemorrhage within the right basal ganglia. Heidelberg classification 1b: HI2, confluent petechiae, no mass effect.  TEE 02/13/2018 Study Conclusions  - Left ventricle: The cavity size was moderately dilated. Systolic   function was moderately to severely reduced. The estimated   ejection fraction was in the range of 30% to 35%. Moderate   diffuse hypokinesis. - Mitral valve: There was mild regurgitation. - Left atrium: Cannot rule out early forming thrombus in the LA   appendage vs. artifact from posterior wall of appendage. The   atrium was moderately dilated. No evidence of thrombus in the   atrial cavity or appendage. - Right atrium: No evidence of thrombus in the atrial cavity or   appendage. - Tricuspid valve: There was mild regurgitation. - Pulmonic valve: There was trivial regurgitation. - Impressions: The patient had frequent PACs and nonsustained   atrial tachycardia.   IR ANGIO VERTEBRAL 02/10/2018 IMPRESSION: Status post endovascular revascularization of occluded right middle cerebral artery with 2 passes using the 5 mm x 33 mm Embotrap retrieval device achieving a TICI 2b revascularization with delayed closure and angiographic evidence of slow revascularization from collaterals arising from the recurrent artery of [place], and the lateral lenticulostriate branches, and also the pericallosal and the callosal marginal branches of the right anterior cerebral artery.  Focal small dissection in the right internal carotid artery petrous segment intact without intraluminal filling defects or  stenosis.  ASSESSMENT: CAMMIE FAULSTICH is a 78 y.o. year old female here with right MCA infarct with right M1 occlusion and failed IR on 02/10/2018 secondary to cardioembolic source. Vascular risk factors include prior tobacco use, prior stroke (06/2017), HTN, HLD, cardiomyopathy, and CHF.  Patient return to ED and was found to have atrial fibrillation along with worsening CHF and was started on Eliquis.  Patient is being seen today for hospital follow-up and does have continued stroke deficits of left hemiplegia and dysarthria.    PLAN:  1. Right MCA infarct: Continue Eliquis (apixaban) daily  and atorvastatin for secondary stroke prevention. Maintain strict control of hypertension with blood pressure goal 1 30-1 50 due to intracranial stenosis, diabetes with hemoglobin A1c goal below 6.5% and cholesterol with LDL cholesterol (bad cholesterol) goal below 70 mg/dL.  I also advised the patient to eat a healthy diet with plenty of whole grains, cereals, fruits and vegetables, exercise regularly with at least 30 minutes of continuous activity daily and maintain ideal body weight. 2. Post stroke deficits: Continue PT/OT/ST for continued left hemiplegia and dysarthria; requested facility to continue to monitor for depression post stroke as she denied at today's visit 3. HTN: Advised to continue current treatment regimen.  Today's BP 97/77.  Advised to continue to monitor at home along with continued follow-up with PCP for management 4. HLD: Advised to continue current treatment regimen along with continued follow-up with PCP for future prescribing and monitoring of lipid panel 5. Intracranial stenosis: Referral placed to vascular surgery for follow-up as recommended at hospital discharge 6. Atrial fibrillation: Advised husband to schedule appointment with prior cardiologist for atrial fibrillation and Eliquis management along with blood pressure management with attempts to avoid  hypotension    Follow up in 3 months or call earlier if needed   Greater than 50% of time during this 25 minute visit was spent on counseling, explanation of diagnosis of right MCA infarct, reviewing risk factor management of HTN, HLD, atrial fibrillation and intracranial stenosis, planning of further management along with potential future management, and discussion with patient and family answering all questions.    Venancio Poisson, AGNP-BC  [facility] [address] [place], [place] [address]  Phone [phone] Fax [phone] Note: This document was prepared with digital dictation and possible smart phrase technology. Any transcriptional errors that result from this process are unintentional.

## 2018-04-20 ENCOUNTER — Encounter: Payer: Medicare Other | Admitting: Physical Medicine & Rehabilitation

## 2018-04-25 NOTE — Progress Notes (Signed)
I agree with the above plan 

## 2018-05-04 ENCOUNTER — Telehealth: Payer: Self-pay | Admitting: Adult Health

## 2018-05-04 ENCOUNTER — Encounter: Payer: Medicare Other | Attending: Physical Medicine & Rehabilitation | Admitting: Physical Medicine & Rehabilitation

## 2018-05-04 ENCOUNTER — Encounter: Payer: Self-pay | Admitting: Physical Medicine & Rehabilitation

## 2018-05-04 VITALS — BP 103/68 | HR 89 | Resp 14

## 2018-05-04 DIAGNOSIS — J449 Chronic obstructive pulmonary disease, unspecified: Secondary | ICD-10-CM | POA: Insufficient documentation

## 2018-05-04 DIAGNOSIS — I10 Essential (primary) hypertension: Secondary | ICD-10-CM | POA: Diagnosis not present

## 2018-05-04 DIAGNOSIS — I63511 Cerebral infarction due to unspecified occlusion or stenosis of right middle cerebral artery: Secondary | ICD-10-CM | POA: Insufficient documentation

## 2018-05-04 DIAGNOSIS — R262 Difficulty in walking, not elsewhere classified: Secondary | ICD-10-CM | POA: Insufficient documentation

## 2018-05-04 DIAGNOSIS — G8194 Hemiplegia, unspecified affecting left nondominant side: Secondary | ICD-10-CM | POA: Insufficient documentation

## 2018-05-04 DIAGNOSIS — Z87891 Personal history of nicotine dependence: Secondary | ICD-10-CM | POA: Insufficient documentation

## 2018-05-04 DIAGNOSIS — M8580 Other specified disorders of bone density and structure, unspecified site: Secondary | ICD-10-CM | POA: Diagnosis not present

## 2018-05-04 DIAGNOSIS — R200 Anesthesia of skin: Secondary | ICD-10-CM | POA: Diagnosis not present

## 2018-05-04 DIAGNOSIS — Z7901 Long term (current) use of anticoagulants: Secondary | ICD-10-CM | POA: Diagnosis not present

## 2018-05-04 DIAGNOSIS — R439 Unspecified disturbances of smell and taste: Secondary | ICD-10-CM | POA: Diagnosis not present

## 2018-05-04 DIAGNOSIS — I429 Cardiomyopathy, unspecified: Secondary | ICD-10-CM | POA: Diagnosis not present

## 2018-05-04 DIAGNOSIS — I130 Hypertensive heart and chronic kidney disease with heart failure and stage 1 through stage 4 chronic kidney disease, or unspecified chronic kidney disease: Secondary | ICD-10-CM | POA: Insufficient documentation

## 2018-05-04 DIAGNOSIS — Z79899 Other long term (current) drug therapy: Secondary | ICD-10-CM | POA: Insufficient documentation

## 2018-05-04 DIAGNOSIS — G8110 Spastic hemiplegia affecting unspecified side: Secondary | ICD-10-CM | POA: Diagnosis not present

## 2018-05-04 DIAGNOSIS — I6601 Occlusion and stenosis of right middle cerebral artery: Secondary | ICD-10-CM | POA: Diagnosis not present

## 2018-05-04 DIAGNOSIS — R531 Weakness: Secondary | ICD-10-CM | POA: Insufficient documentation

## 2018-05-04 DIAGNOSIS — I5042 Chronic combined systolic (congestive) and diastolic (congestive) heart failure: Secondary | ICD-10-CM | POA: Insufficient documentation

## 2018-05-04 DIAGNOSIS — I2510 Atherosclerotic heart disease of native coronary artery without angina pectoris: Secondary | ICD-10-CM | POA: Diagnosis not present

## 2018-05-04 DIAGNOSIS — R269 Unspecified abnormalities of gait and mobility: Secondary | ICD-10-CM | POA: Insufficient documentation

## 2018-05-04 DIAGNOSIS — Z8673 Personal history of transient ischemic attack (TIA), and cerebral infarction without residual deficits: Secondary | ICD-10-CM | POA: Insufficient documentation

## 2018-05-04 DIAGNOSIS — N183 Chronic kidney disease, stage 3 (moderate): Secondary | ICD-10-CM | POA: Diagnosis not present

## 2018-05-04 NOTE — Telephone Encounter (Signed)
I spoke with Shelly Coss [facility] cardiology office. Ivin Booty stated she has left several messages at [facility] to arrange an appt with pt. Ivin Booty stated no one has called her back. I told Ivin Booty we will call Miquel Dunn to find out who handles transportation and the schedules. Ivin Booty stated they can call her and leave a vm.

## 2018-05-04 NOTE — Telephone Encounter (Signed)
Sharon/[phone] called states the pt is at [facility]. She has left 2 messages for a return call but has not rec'd a call back. Does this pt still need 30 day heart monitor? She said the pt has an appt with DrKoneswaran/[place] on 2/14. She can make arrangements to have it placed then if she still needs it. Please call to advise

## 2018-05-04 NOTE — Progress Notes (Addendum)
Subjective:    Patient ID: Brandy [name], female    DOB: [birth year], 78 y.o.   MRN: [id number]  HPI 78 year old right-handed female multi-medical cardiomyopathy, diastolic, systolic congestive heart failure, hypertension, COPD as well as CVA, 06/2017 presents for hospital follow-up after receiving CIR for right MCA infarct.  Since discharge, patient went back to the hospital for acute on chronic CHF exacerbation.  Notes reviewed.  She was discharged to SNF and encouraged to follow up with Neurology, which she has done.  She was also encouraged to with Cards, who she sees next week.  Husband supplements history.  She is still at the SNF.  BP is controlled. She has been having lab work, reviewed, improving. She is getting therapies there, notes reviewed, with progress. Denies falls.   Pain Inventory Average Pain 0 Pain Right Now 0 My pain is no pain  In the last 24 hours, has pain interfered with the following? General activity 4 Relation with others 0 Enjoyment of life 5 What TIME of day is your pain at its worst? no pain Sleep (in general) Fair  Pain is worse with: no pain Pain improves with: no pain Relief from Meds: no pain  Mobility use a wheelchair needs help with transfers Do you have any goals in this area?  yes  Function retired I need assistance with the following:  feeding, dressing, bathing, toileting, meal prep, household duties and shopping  Neuro/Psych weakness numbness trouble walking loss of taste or smell  Prior Studies hospital f/u  Physicians involved in your care hospital f/u   Family History  Problem Relation Age of Onset  . Hypertension Brother   . Hypertension Maternal Grandmother   . Cancer Paternal Grandmother    Social History   Socioeconomic History  . Marital status: Married    Spouse name: Not on file  . Number of children: Not on file  . Years of education: Not on file  . Highest education level: Not on file  Occupational  History  . Occupation: Former Product manager: RETIRED  Social Needs  . Financial resource strain: Not on file  . Food insecurity:    Worry: Not on file    Inability: Not on file  . Transportation needs:    Medical: Not on file    Non-medical: Not on file  Tobacco Use  . Smoking status: Former Smoker    Packs/day: 1.00    Years: 50.00    Pack years: 50.00    Types: Cigarettes    Start date: 09/02/1960    Last attempt to quit: 03/29/2008    Years since quitting: 10.1  . Smokeless tobacco: Never Used  Substance and Sexual Activity  . Alcohol use: No    Alcohol/week: 0.0 standard drinks  . Drug use: Never  . Sexual activity: Not Currently  Lifestyle  . Physical activity:    Days per week: Not on file    Minutes per session: Not on file  . Stress: Not on file  Relationships  . Social connections:    Talks on phone: Not on file    Gets together: Not on file    Attends religious service: Not on file    Active member of club or organization: Not on file    Attends meetings of clubs or organizations: Not on file    Relationship status: Not on file  Other Topics Concern  . Not on file  Social History Narrative   Married with 2 adult  children, resides with spouse    Past Surgical History:  Procedure Laterality Date  . BREAST BIOPSY    . CARPAL TUNNEL RELEASE     right hand  . CERVICAL FUSION    . CESAREAN SECTION     2 times  . COLONOSCOPY  8099   pt uncertain as to whether polypectomy required or performed  . ECTOPIC PREGNANCY SURGERY    . IR ANGIO VERTEBRAL SEL SUBCLAVIAN INNOMINATE UNI R MOD SED  02/10/2018  . IR CT HEAD LTD  02/10/2018  . IR PERCUTANEOUS ART THROMBECTOMY/INFUSION INTRACRANIAL INC DIAG ANGIO  02/10/2018  . RADIOLOGY WITH ANESTHESIA N/A 02/10/2018   Procedure: RADIOLOGY WITH ANESTHESIA;  Surgeon: Luanne Bras, MD;  Location: [facility];  Service: Radiology;  Laterality: N/A;  . RIGHT/LEFT HEART CATH AND CORONARY ANGIOGRAPHY N/A 06/03/2017    Procedure: RIGHT/LEFT HEART CATH AND CORONARY ANGIOGRAPHY;  Surgeon: Jettie Booze, MD;  Location: [facility];  Service: Cardiovascular;  Laterality: N/A;  . TEE WITHOUT CARDIOVERSION N/A 02/13/2018   Procedure: TRANSESOPHAGEAL ECHOCARDIOGRAM (TEE);  Surgeon: Sueanne Margarita, MD;  Location: [facility] ENDOSCOPY;  Service: Cardiovascular;  Laterality: N/A;  . TONSILLECTOMY     Past Medical History:  Diagnosis Date  . Cardiomyopathy    a. EF of 25% in 07/2006 b. EF normalized by repeat echo in 2011 c. EF 35-40% by echo in 05/2017 with cath showing mild nonobstructive CAD  . Chronic combined systolic and diastolic CHF (congestive heart failure) ([place])   . Colon polyps   . Diverticulosis of colon   . Hypertension   . Hypertensive heart disease   . Osteopenia   . Stroke ([place])   . TIA (transient ischemic attack)   . Tobacco abuse    50 pack year   BP 103/68   Pulse 89   Resp 14   SpO2 96%   Opioid Risk Score:   Fall Risk Score:  `1  Depression screen PHQ 2/9  No flowsheet data found.  Review of Systems  Constitutional: Positive for appetite change, diaphoresis and unexpected weight change.  HENT: Negative.   Eyes: Negative.   Respiratory: Negative.   Cardiovascular: Positive for leg swelling.  Endocrine: Negative.   Genitourinary: Negative.   Musculoskeletal: Positive for gait problem.  Neurological: Positive for weakness and numbness.  All other systems reviewed and are negative.     Objective:   Physical Exam Constitutional: No distress . Vital signs reviewed. HENT: Normocephalic.  Atraumatic. Eyes: EOMI. No discharge. Cardiovascular: RRR. No JVD. Respiratory: CTA bilaterally. Normal effort. +. GI: BS +. Non-distended. Musc: No edema or tenderness in extremities. Neurological: Alert.  Dysarthria, improving Sensation diminished to light touch left upper and lower extremity.  Motor: LUE/LLE: 0/5 proximal to distal Right lower extremity: 2/5 hip flexion,  knee extension, 4+/5 ankle dorsiflexion Emerging tone left elbow flexors Skin: Skin is warm and dry.  Psych: Flat    Assessment & Plan:  78 year old right-handed female multi-medical cardiomyopathy, diastolic, systolic congestive heart failure, hypertension, COPD as well as CVA, 06/2017 presents for hospital follow-up after receiving CIR for right MCA infarct.  1.  Left side weakness secondary to right MCA infarction with right M1 occlusion status post minimal recannulization after mechanical thrombectomy as well as history of left MCA infarction April 2019, now with spasticity.   Cont follow up with Neuro  Follow up with Cards             Cont therapies at SNF  Will consider anti-spasticity  meds if tone becomes a factor  2. Cardiomyopathy/chronic combined diastolic congestive heart failure.   Cont follow up with Cards  3. Hypertension.   Cont meds  Relatively controlled today  4.  CKD Stage III             Creatinine 1.08 on most recent lab work on 2/5  5. Gait abnormality  Cont wheelchair for safety

## 2018-05-04 NOTE — Telephone Encounter (Signed)
Left vm for Ivin Booty at  [facility] about heart monitor.

## 2018-05-04 NOTE — Telephone Encounter (Signed)
I called [employer] and requested the scheduler who makes appts for pts. I was transfer to a vm. I left vm of the reason of my phone call, patients name and birthdate. I left  number for them to call Ivin Booty at [facility] cardiology office to schedule pt for cardiac monitor.

## 2018-05-05 NOTE — Telephone Encounter (Signed)
Noted! Thank you

## 2018-05-10 ENCOUNTER — Other Ambulatory Visit: Payer: Self-pay

## 2018-05-10 NOTE — Patient Outreach (Signed)
First attempt to obtain mRs. No answer. Left message for return call.  

## 2018-05-12 ENCOUNTER — Ambulatory Visit (INDEPENDENT_AMBULATORY_CARE_PROVIDER_SITE_OTHER): Payer: Medicare Other | Admitting: Cardiovascular Disease

## 2018-05-12 ENCOUNTER — Encounter: Payer: Self-pay | Admitting: Cardiovascular Disease

## 2018-05-12 VITALS — BP 104/64 | HR 92 | Ht 64.0 in

## 2018-05-12 DIAGNOSIS — I5042 Chronic combined systolic (congestive) and diastolic (congestive) heart failure: Secondary | ICD-10-CM | POA: Diagnosis not present

## 2018-05-12 DIAGNOSIS — I480 Paroxysmal atrial fibrillation: Secondary | ICD-10-CM | POA: Diagnosis not present

## 2018-05-12 DIAGNOSIS — I428 Other cardiomyopathies: Secondary | ICD-10-CM

## 2018-05-12 DIAGNOSIS — Z8673 Personal history of transient ischemic attack (TIA), and cerebral infarction without residual deficits: Secondary | ICD-10-CM

## 2018-05-12 DIAGNOSIS — E785 Hyperlipidemia, unspecified: Secondary | ICD-10-CM

## 2018-05-12 DIAGNOSIS — I10 Essential (primary) hypertension: Secondary | ICD-10-CM

## 2018-05-12 MED ORDER — POTASSIUM CHLORIDE 20 MEQ PO PACK: 40.0000 meq | PACK | ORAL | 6 refills | Status: DC

## 2018-05-12 MED ORDER — FUROSEMIDE 40 MG PO TABS: ORAL_TABLET | ORAL | 6 refills | Status: DC

## 2018-05-12 NOTE — Progress Notes (Signed)
SUBJECTIVE: The patient presents for posthospitalization follow-up.  She was hospitalized in early January with acute on chronic combined CHF and rapid atrial fibrillation.  Most recent echocardiogram performed on 03/30/2018 showed severely reduced left ventricular systolic function, LVEF 20 to 25%, diffuse hypokinesis, grade 2 diastolic dysfunction with high filling pressures, and moderate mitral regurgitation.  Coronary angiography on 06/03/17 demonstrated mild nonobstructive disease with mildly elevated right heart pressures with volume overload. Therewas a mid RCA 25% stenosis and a 25% stenosis of the ostium of the second diagonal branch.  She is here with her husband and her brother, Dr. Randel Pigg, who is a retired Doctor, hospital in [place].  The patient denies any symptoms of chest pain, palpitations, shortness of breath, lightheadedness, dizziness, leg swelling, orthopnea, PND, and syncope.  Her appetite is improving.  She is currently staying at [facility] and receiving rehabilitation there in [place].  She has been using oxygen by nasal cannula for comfort.  O2 sats on room air remained in the 95% range according to her brother.   Review of Systems: As per "subjective", otherwise negative.  Allergies  Allergen Reactions  . Lisinopril Swelling    Current Outpatient Medications  Medication Sig Dispense Refill  . acetaminophen (TYLENOL) 325 MG tablet Take 2 tablets (650 mg total) by mouth every 4 (four) hours as needed for mild pain (or temp > 37.5 C (99.5 F)). (Patient taking differently: Take 650 mg by mouth 4 (four) times daily. )    . apixaban (ELIQUIS) 5 MG TABS tablet Take 1 tablet (5 mg total) by mouth 2 (two) times daily. 60 tablet 0  . atorvastatin (LIPITOR) 10 MG tablet Take 1 tablet (10 mg total) by mouth daily at 6 PM. 30 tablet 2  . carvedilol (COREG) 12.5 MG tablet Take 2 tablets (25 mg total) by mouth 2 (two) times daily with a meal.    .  donepezil (ARICEPT) 5 MG tablet Take 1 tablet by mouth at bedtime.  1  . furosemide (LASIX) 40 MG tablet Take 1 tablet (40 mg total) by mouth 2 (two) times daily. 60 tablet 0  . hydrALAZINE (APRESOLINE) 10 MG tablet Take 1 tablet (10 mg total) by mouth every 8 (eight) hours. 30 tablet 0  . isosorbide mononitrate (IMDUR) 30 MG 24 hr tablet Take 0.5 tablets (15 mg total) by mouth daily. 30 tablet 0  . methylphenidate (RITALIN) 5 MG tablet Take 1 tablet (5 mg total) by mouth 2 (two) times daily with breakfast and lunch. 6 tablet 0  . potassium chloride (KLOR-CON) 20 MEQ packet Take 40 mEq by mouth daily. 30 packet 0  . spironolactone (ALDACTONE) 25 MG tablet Take 0.5 tablets (12.5 mg total) by mouth daily. 15 tablet 0  . traMADol (ULTRAM) 50 MG tablet Take 1 tablet (50 mg total) by mouth every 6 (six) hours as needed for moderate pain. 14 tablet 0  . umeclidinium-vilanterol (ANORO ELLIPTA) 62.5-25 MCG/INH AEPB Inhale 1 puff into the lungs daily. 60 each 5   No current facility-administered medications for this visit.     Past Medical History:  Diagnosis Date  . Cardiomyopathy    a. EF of 25% in 07/2006 b. EF normalized by repeat echo in 2011 c. EF 35-40% by echo in 05/2017 with cath showing mild nonobstructive CAD  . Chronic combined systolic and diastolic CHF (congestive heart failure) ([place])   . Colon polyps   . Diverticulosis of colon   . Hypertension   .  Hypertensive heart disease   . Osteopenia   . Stroke ([place])   . TIA (transient ischemic attack)   . Tobacco abuse    50 pack year    Past Surgical History:  Procedure Laterality Date  . BREAST BIOPSY    . CARPAL TUNNEL RELEASE     right hand  . CERVICAL FUSION    . CESAREAN SECTION     2 times  . COLONOSCOPY  2979   pt uncertain as to whether polypectomy required or performed  . ECTOPIC PREGNANCY SURGERY    . IR ANGIO VERTEBRAL SEL SUBCLAVIAN INNOMINATE UNI R MOD SED  02/10/2018  . IR CT HEAD LTD  02/10/2018  . IR  PERCUTANEOUS ART THROMBECTOMY/INFUSION INTRACRANIAL INC DIAG ANGIO  02/10/2018  . RADIOLOGY WITH ANESTHESIA N/A 02/10/2018   Procedure: RADIOLOGY WITH ANESTHESIA;  Surgeon: Luanne Bras, MD;  Location: [facility];  Service: Radiology;  Laterality: N/A;  . RIGHT/LEFT HEART CATH AND CORONARY ANGIOGRAPHY N/A 06/03/2017   Procedure: RIGHT/LEFT HEART CATH AND CORONARY ANGIOGRAPHY;  Surgeon: Jettie Booze, MD;  Location: [facility];  Service: Cardiovascular;  Laterality: N/A;  . TEE WITHOUT CARDIOVERSION N/A 02/13/2018   Procedure: TRANSESOPHAGEAL ECHOCARDIOGRAM (TEE);  Surgeon: Sueanne Margarita, MD;  Location: [facility] ENDOSCOPY;  Service: Cardiovascular;  Laterality: N/A;  . TONSILLECTOMY      Social History   Socioeconomic History  . Marital status: Married    Spouse name: Not on file  . Number of children: Not on file  . Years of education: Not on file  . Highest education level: Not on file  Occupational History  . Occupation: Former Product manager: RETIRED  Social Needs  . Financial resource strain: Not on file  . Food insecurity:    Worry: Not on file    Inability: Not on file  . Transportation needs:    Medical: Not on file    Non-medical: Not on file  Tobacco Use  . Smoking status: Former Smoker    Packs/day: 1.00    Years: 50.00    Pack years: 50.00    Types: Cigarettes    Start date: 09/02/1960    Last attempt to quit: 03/29/2008    Years since quitting: 10.1  . Smokeless tobacco: Never Used  Substance and Sexual Activity  . Alcohol use: No    Alcohol/week: 0.0 standard drinks  . Drug use: Never  . Sexual activity: Not Currently  Lifestyle  . Physical activity:    Days per week: Not on file    Minutes per session: Not on file  . Stress: Not on file  Relationships  . Social connections:    Talks on phone: Not on file    Gets together: Not on file    Attends religious service: Not on file    Active member of club or organization: Not on file     Attends meetings of clubs or organizations: Not on file    Relationship status: Not on file  . Intimate partner violence:    Fear of current or ex partner: Not on file    Emotionally abused: Not on file    Physically abused: Not on file    Forced sexual activity: Not on file  Other Topics Concern  . Not on file  Social History Narrative   Married with 2 adult children, resides with spouse      Vitals:   05/12/18 0929  BP: 104/64  Pulse: 92  SpO2: 97%  Height: 5\' 4"  (1.626 m)    Wt Readings from Last 3 Encounters:  04/04/18 125 lb 14.4 oz (57.1 kg)  02/16/18 188 lb 14.9 oz (85.7 kg)  02/13/18 151 lb 0.2 oz (68.5 kg)     PHYSICAL EXAM General: NAD, frail-appearing HEENT: Normal. Neck: No JVD, no thyromegaly. Lungs: Clear to auscultation bilaterally with normal respiratory effort. CV: Regular rate and rhythm, normal S1/S2, no S3/S4, no murmur. No pretibial or periankle edema.    Abdomen: Soft, nontender, no distention.  Neurologic: Alert and oriented.  Psych: Normal affect. Skin: Normal. Musculoskeletal: No gross deformities.    ECG: Reviewed above under Subjective   Labs: Lab Results  Component Value Date/Time   K 3.5 04/04/2018 05:07 AM   BUN 16 04/04/2018 05:07 AM   CREATININE 1.11 (H) 04/04/2018 05:07 AM   CREATININE 1.27 (H) 08/04/2017 12:59 PM   ALT 14 03/30/2018 04:21 AM   TSH 1.033 03/29/2018 04:21 AM   HGB 10.1 (L) 04/04/2018 05:07 AM     Lipids: Lab Results  Component Value Date/Time   LDLCALC 52 02/11/2018 04:47 AM   CHOL 93 02/11/2018 04:47 AM   TRIG 42 02/11/2018 04:47 AM   HDL 33 (L) 02/11/2018 04:47 AM       ASSESSMENT AND PLAN: 1.  Paroxysmal atrial fibrillation: Anticoagulated with Eliquis.  Heart rate controlled on carvedilol.  Symptomatically stable.  No changes.  2.  Chronic combined CHF: LVEF 20 to 25%.  She appears euvolemic.  Continue carvedilol, hydralazine, spironolactone, and Imdur.  Her brother questioned if Lasix could be  switched to every other day.  I have obliged.  She will take 40 mg of Lasix with supplemental potassium every other day.  She can take an extra 40 mg of Lasix with potassium should weight increased by 3 pounds in 24 hours or 5 pounds in a week.  She developed angioedema with lisinopril and thus ACE inhibitors and angiotensin receptor blockers are being avoided.  Creatinine 1.11 on 04/04/2018.  I will obtain a follow-up echocardiogram within the next few months.  3.  History of recurrent CVA: Continue apixaban and atorvastatin.  4.  Hypertension: BP is normal.  This will need continued monitoring given diuretic dose adjustment.  5.  Hyperlipidemia: LDL 52 on 02/11/2018 with full lipid panel reviewed above.  Continue atorvastatin.    Disposition: Follow up 6 weeks with APP  Time spent: 40 minutes, of which greater than 50% was spent reviewing symptoms, relevant blood tests and studies, and discussing management plan with the patient.    Kate Sable, M.D., F.A.C.C.

## 2018-05-12 NOTE — Patient Instructions (Signed)
Medication Instructions:  Take lasix and potassium EVERY OTHER DAY  May take an extra 40 mg lasix and potassium 20 mg if patient has weight gain over 3 lbs in 24 hours  WEIGH DAILY   If you need a refill on your cardiac medications before your next appointment, please call your pharmacy.   Lab work: NONE TODAY If you have labs (blood work) drawn today and your tests are completely normal, you will receive your results only by: Marland Kitchen MyChart Message (if you have MyChart) OR . A paper copy in the mail If you have any lab test that is abnormal or we need to change your treatment, we will call you to review the results.  Testing/Procedures: NONE TODAY  Follow-Up: At [facility], you and your health needs are our priority.  As part of our continuing mission to provide you with exceptional heart care, we have created designated Provider Care Teams.  These Care Teams include your primary Cardiologist (physician) and Advanced Practice Providers (APPs -  Physician Assistants and Nurse Practitioners) who all work together to provide you with the care you need, when you need it. You will need a follow up appointment in 3 weeks.  Please call our office 2 months in advance to schedule this appointment.  You may see Kate Sable, MD or one of the following Advanced Practice Providers on your designated Care Team:   Bernerd Pho, PA-C [facility]) . Ermalinda Barrios, PA-C ([place])  Any Other Special Instructions Will Be Listed Below (If Applicable). none

## 2018-05-16 ENCOUNTER — Other Ambulatory Visit: Payer: Self-pay

## 2018-05-16 NOTE — Patient Outreach (Signed)
Second attempt to obtain mRs. No answer. Left message for return call. On home phone.

## 2018-05-23 ENCOUNTER — Other Ambulatory Visit: Payer: Self-pay

## 2018-05-23 NOTE — Patient Outreach (Signed)
Telephone outreach to patient to obtain mRs was successfully completed. mRs= 2. 

## 2018-06-07 ENCOUNTER — Telehealth: Payer: Self-pay | Admitting: *Deleted

## 2018-06-07 DIAGNOSIS — Z79899 Other long term (current) drug therapy: Secondary | ICD-10-CM

## 2018-06-07 NOTE — Telephone Encounter (Signed)
Lattie Haw, RN from [facility]. Pt was seen on 05/12/18 and at that time was instructed to decrease Lasix and potassium to every other day. Lattie Haw reports that order was not seen on their sheet. Pt has been receiving Lasix and potassium daily. Lattie Haw would also like verbal order to weigh daily. Please advise.

## 2018-06-08 NOTE — Telephone Encounter (Signed)
Spoke with Lattie Haw, RN. Verbal order given for daily weights and BMET. Lattie Haw voiced understanding.

## 2018-06-08 NOTE — Telephone Encounter (Signed)
That would be fine 

## 2018-06-08 NOTE — Addendum Note (Signed)
Addended by: Levonne Hubert on: 06/08/2018 10:53 AM   Modules accepted: Orders

## 2018-06-21 ENCOUNTER — Telehealth: Payer: Self-pay | Admitting: Student

## 2018-06-21 NOTE — Telephone Encounter (Signed)
   Cardiac Questionnaire:    Since your last visit or hospitalization:    1. Have you been having new or worsening chest pain? No   2. Have you been having new or worsening shortness of breath? No 3. Have you been having new or worsening leg swelling, wt gain, or increase in abdominal girth (pants fitting more tightly)? No   4. Have you had any passing out spells? No    *A YES to any of these questions would result in the appointment being kept. *If all the answers to these questions are NO, we should indicate that given the current situation regarding the worldwide coronarvirus pandemic, at the recommendation of the CDC, we are looking to limit gatherings in our waiting area, and thus will reschedule their appointment beyond four weeks from today.   _____________   Spoke with patient's husband as she is currently at [facility]. He has not been able to visit her in person but has been communicating by phone and says she has overall been doing well and has not reported any of the above cardiac symptoms. Also spoke with patient's nurse at [facility]) who reported she was doing well. Will cancel visit for 3/27 and reschedule 3-4 weeks from now. Patient's spouse was encouraged to call if she develops any new symptoms.   Signed, Erma Heritage, PA-C 06/21/2018, 12:15 PM Pager: [phone]

## 2018-06-23 ENCOUNTER — Ambulatory Visit: Payer: Medicare Other | Admitting: Student

## 2018-06-23 ENCOUNTER — Telehealth: Payer: Self-pay | Admitting: Cardiovascular Disease

## 2018-06-23 ENCOUNTER — Other Ambulatory Visit: Payer: Self-pay | Admitting: Cardiovascular Disease

## 2018-06-23 MED ORDER — FUROSEMIDE 40 MG PO TABS: ORAL_TABLET | ORAL | 6 refills | Status: DC

## 2018-06-23 MED ORDER — ISOSORBIDE MONONITRATE ER 30 MG PO TB24: 15.0000 mg | ORAL_TABLET | Freq: Every day | ORAL | 0 refills | Status: DC

## 2018-06-23 NOTE — Telephone Encounter (Signed)
This is a 90 day supply- 1/2 tablet daily

## 2018-06-23 NOTE — Telephone Encounter (Signed)
Please call Ronda Fairly PT w/ [facility] @ [phone] concerning pt's BP

## 2018-06-23 NOTE — Telephone Encounter (Signed)
[facility] wanted to be sure that medications were same since she left facility. Went over list. Refilled medications.

## 2018-07-04 ENCOUNTER — Other Ambulatory Visit: Payer: Self-pay | Admitting: Physician Assistant

## 2018-07-05 ENCOUNTER — Encounter: Payer: Medicare Other | Admitting: Physical Medicine & Rehabilitation

## 2018-07-10 ENCOUNTER — Encounter: Payer: Medicare Other | Admitting: Physical Medicine & Rehabilitation

## 2018-07-10 ENCOUNTER — Other Ambulatory Visit: Payer: Self-pay

## 2018-07-18 ENCOUNTER — Telehealth: Payer: Self-pay

## 2018-07-18 NOTE — Telephone Encounter (Signed)
I called husband about visit on April 21 with JEssica NP at 245pm. I explained due to COVID 19 we are only doing video visits.His wife is currently home and was discharge from the nursing home. The pts husband has a email address and a apple phone. I was on the phone with husband for over 20 minutes trying to help him download the www.webex.com/downloads on his phone. The husband was in the apple store to download link. He was not able to find download button to install on his phone. He will have his daughter assist him in downloading the appt. Pt husband  will call tomorrow for email confirmation.

## 2018-07-19 ENCOUNTER — Telehealth: Payer: Self-pay

## 2018-07-19 NOTE — Telephone Encounter (Signed)
Left vm for patients daughter Brandy [name] if the webex was downloaded on his cell phone by her. Trying to make sure everything is set for Monday.

## 2018-07-19 NOTE — Telephone Encounter (Signed)
I call pts husband he stated his daughter did download the app for him on the phone for video visit on Monday.

## 2018-07-24 ENCOUNTER — Ambulatory Visit: Payer: Medicare Other | Admitting: Adult Health

## 2018-07-24 NOTE — Telephone Encounter (Signed)
Tried to call husband number to get video visit and to file insurance, Also need to know if he will ready for video visit at 245pm. Phone rang no vm was set up.

## 2018-07-28 ENCOUNTER — Ambulatory Visit: Payer: Medicare Other | Admitting: Student

## 2018-08-28 ENCOUNTER — Telehealth: Payer: Self-pay | Admitting: Cardiovascular Disease

## 2018-08-28 NOTE — Telephone Encounter (Signed)
Virtual Visit Pre-Appointment Phone Call  "(Name), I am calling you today to discuss your upcoming appointment. We are currently trying to limit exposure to the virus that causes COVID-19 by seeing patients at home rather than in the office."  1. "What is the BEST phone number to call the day of the visit?" - include this in appointment notes  2. Do you have or have access to (through a family member/friend) a smartphone with video capability that we can use for your visit?" a. If yes - list this number in appt notes as cell (if different from BEST phone #) and list the appointment type as a VIDEO visit in appointment notes b. If no - list the appointment type as a PHONE visit in appointment notes  3. Confirm consent - "In the setting of the current Covid19 crisis, you are scheduled for a (phone or video) visit with your provider on (date) at (time).  Just as we do with many in-office visits, in order for you to participate in this visit, we must obtain consent.  If you'd like, I can send this to your mychart (if signed up) or email for you to review.  Otherwise, I can obtain your verbal consent now.  All virtual visits are billed to your insurance company just like a normal visit would be.  By agreeing to a virtual visit, we'd like you to understand that the technology does not allow for your provider to perform an examination, and thus may limit your provider's ability to fully assess your condition. If your provider identifies any concerns that need to be evaluated in person, we will make arrangements to do so.  Finally, though the technology is pretty good, we cannot assure that it will always work on either your or our end, and in the setting of a video visit, we may have to convert it to a phone-only visit.  In either situation, we cannot ensure that we have a secure connection.  Are you willing to proceed?" STAFF: Did the patient verbally acknowledge consent to telehealth visit? Document  YES/NO here: Yes  4. Advise patient to be prepared - "Two hours prior to your appointment, go ahead and check your blood pressure, pulse, oxygen saturation, and your weight (if you have the equipment to check those) and write them all down. When your visit starts, your provider will ask you for this information. If you have an Apple Watch or Kardia device, please plan to have heart rate information ready on the day of your appointment. Please have a pen and paper handy nearby the day of the visit as well."  5. Give patient instructions for MyChart download to smartphone OR [email or web address] as below if video visit (depending on what platform provider is using)  6. Inform patient they will receive a phone call 15 minutes prior to their appointment time (may be from unknown caller ID) so they should be prepared to answer    TELEPHONE CALL NOTE  Brandy [name] has been deemed a candidate for a follow-up tele-health visit to limit community exposure during the Covid-19 pandemic. I spoke with the patient via phone to ensure availability of phone/video source, confirm preferred email & phone number, and discuss instructions and expectations.  I reminded Brandy [name] to be prepared with any vital sign and/or heart rhythm information that could potentially be obtained via home monitoring, at the time of her visit. I reminded Brandy [name] to expect a phone call prior to  her visit.  Terry L Goins 08/28/2018 9:20 AM

## 2018-08-29 ENCOUNTER — Telehealth: Payer: Medicare Other | Admitting: Cardiovascular Disease

## 2018-08-29 ENCOUNTER — Ambulatory Visit: Payer: Medicare Other | Admitting: Cardiology

## 2018-08-29 ENCOUNTER — Other Ambulatory Visit: Payer: Self-pay

## 2018-08-31 ENCOUNTER — Telehealth (INDEPENDENT_AMBULATORY_CARE_PROVIDER_SITE_OTHER): Payer: Medicare Other | Admitting: Cardiovascular Disease

## 2018-08-31 ENCOUNTER — Other Ambulatory Visit: Payer: Self-pay

## 2018-08-31 ENCOUNTER — Encounter: Payer: Self-pay | Admitting: Cardiovascular Disease

## 2018-08-31 VITALS — BP 104/60 | HR 62 | Ht 64.0 in

## 2018-08-31 DIAGNOSIS — E785 Hyperlipidemia, unspecified: Secondary | ICD-10-CM

## 2018-08-31 DIAGNOSIS — I10 Essential (primary) hypertension: Secondary | ICD-10-CM

## 2018-08-31 DIAGNOSIS — I428 Other cardiomyopathies: Secondary | ICD-10-CM

## 2018-08-31 DIAGNOSIS — I5042 Chronic combined systolic (congestive) and diastolic (congestive) heart failure: Secondary | ICD-10-CM

## 2018-08-31 DIAGNOSIS — Z8673 Personal history of transient ischemic attack (TIA), and cerebral infarction without residual deficits: Secondary | ICD-10-CM

## 2018-08-31 DIAGNOSIS — I480 Paroxysmal atrial fibrillation: Secondary | ICD-10-CM | POA: Diagnosis not present

## 2018-08-31 NOTE — Patient Instructions (Signed)
Medication Instructions: Your physician recommends that you continue on your current medications as directed. Please refer to the Current Medication list given to you today.   Labwork: none  Procedures/Testing: Your physician has requested that you have a "Limited"  echocardiogram. Echocardiography is a painless test that uses sound waves to create images of your heart. It provides your doctor with information about the size and shape of your heart and how well your heart's chambers and valves are working. This procedure takes approximately one hour. There are no restrictions for this procedure.    Follow-Up: 4 months with the Physician Assistance   Any Additional Special Instructions Will Be Listed Below (If Applicable).     If you need a refill on your cardiac medications before your next appointment, please call your pharmacy.      Thank you for choosing [facility] !

## 2018-08-31 NOTE — Addendum Note (Signed)
Addended by: Barbarann Ehlers A on: 08/31/2018 02:26 PM   Modules accepted: Orders

## 2018-08-31 NOTE — Progress Notes (Signed)
Virtual Visit via Video Note (switched to phone due to technical issues)   This visit type was conducted due to national recommendations for restrictions regarding the COVID-19 Pandemic (e.g. social distancing) in an effort to limit this patient's exposure and mitigate transmission in our community.  Due to her co-morbid illnesses, this patient is at least at moderate risk for complications without adequate follow up.  This format is felt to be most appropriate for this patient at this time.  All issues noted in this document were discussed and addressed.  A limited physical exam was performed with this format.  Please refer to the patient's chart for her consent to telehealth for [facility].   Date:  08/31/2018   ID:  Brandy [name], DOB December 09, 1940, MRN [id number]  Patient Location: Home Provider Location: Office  PCP:  [name], MD  Cardiologist:  Kate Sable, MD  Electrophysiologist:  None   Evaluation Performed:  Follow-Up Visit  Chief Complaint:  CHF, paroxysmal atrial fibrillation  History of Present Illness:    Brandy [name] is a 78 y.o. female with a history of paroxysmal atrial fibrillation and chronic combined heart failure.  She denies palpitations and leg swelling. She has had issues with itching for the past 3 weeks. Her appetite has been somewhat poor lately as per her husband. He asks if it could be strawberries as she did have an allergy to this many years ago and she has been eating them recently.  The patient does not have symptoms concerning for COVID-19 infection (fever, chills, cough, or new shortness of breath).    Past Medical History:  Diagnosis Date  . Cardiomyopathy    a. EF of 25% in 07/2006 b. EF normalized by repeat echo in 2011 c. EF 35-40% by echo in 05/2017 with cath showing mild nonobstructive CAD  . Chronic combined systolic and diastolic CHF (congestive heart failure) ([place])   . Colon polyps   . Diverticulosis of colon   .  Hypertension   . Hypertensive heart disease   . Osteopenia   . Stroke ([place])   . TIA (transient ischemic attack)   . Tobacco abuse    50 pack year   Past Surgical History:  Procedure Laterality Date  . BREAST BIOPSY    . CARPAL TUNNEL RELEASE     right hand  . CERVICAL FUSION    . CESAREAN SECTION     2 times  . COLONOSCOPY  6294   pt uncertain as to whether polypectomy required or performed  . ECTOPIC PREGNANCY SURGERY    . IR ANGIO VERTEBRAL SEL SUBCLAVIAN INNOMINATE UNI R MOD SED  02/10/2018  . IR CT HEAD LTD  02/10/2018  . IR PERCUTANEOUS ART THROMBECTOMY/INFUSION INTRACRANIAL INC DIAG ANGIO  02/10/2018  . RADIOLOGY WITH ANESTHESIA N/A 02/10/2018   Procedure: RADIOLOGY WITH ANESTHESIA;  Surgeon: Luanne Bras, MD;  Location: [place];  Service: Radiology;  Laterality: N/A;  . RIGHT/LEFT HEART CATH AND CORONARY ANGIOGRAPHY N/A 06/03/2017   Procedure: RIGHT/LEFT HEART CATH AND CORONARY ANGIOGRAPHY;  Surgeon: Jettie Booze, MD;  Location: [place] [facility];  Service: Cardiovascular;  Laterality: N/A;  . TEE WITHOUT CARDIOVERSION N/A 02/13/2018   Procedure: TRANSESOPHAGEAL ECHOCARDIOGRAM (TEE);  Surgeon: Sueanne Margarita, MD;  Location: [facility];  Service: Cardiovascular;  Laterality: N/A;  . TONSILLECTOMY       Current Meds  Medication Sig  . acetaminophen (TYLENOL) 325 MG tablet Take 2 tablets (650 mg total) by mouth every 4 (  four) hours as needed for mild pain (or temp > 37.5 C (99.5 F)). (Patient taking differently: Take 650 mg by mouth 4 (four) times daily. )  . apixaban (ELIQUIS) 5 MG TABS tablet Take 1 tablet (5 mg total) by mouth 2 (two) times daily.  Marland Kitchen atorvastatin (LIPITOR) 10 MG tablet Take 1 tablet (10 mg total) by mouth daily at 6 PM.  . carvedilol (COREG) 12.5 MG tablet Take 12.5 mg by mouth 2 (two) times daily with a meal.  . donepezil (ARICEPT) 5 MG tablet Take 1 tablet by mouth at bedtime.  . furosemide (LASIX) 40 MG tablet Take twice a day EVERY OTHER  DAY  . isosorbide mononitrate (IMDUR) 30 MG 24 hr tablet TAKE 1/2 TABLET(15 MG) BY MOUTH DAILY  . methylphenidate (RITALIN) 5 MG tablet Take 1 tablet (5 mg total) by mouth 2 (two) times daily with breakfast and lunch.  . potassium chloride SA (K-DUR,KLOR-CON) 20 MEQ tablet TAKE 2 TABLETS(40 MEQ) BY MOUTH DAILY  . spironolactone (ALDACTONE) 25 MG tablet Take 0.5 tablets (12.5 mg total) by mouth daily.  . traMADol (ULTRAM) 50 MG tablet Take 1 tablet (50 mg total) by mouth every 6 (six) hours as needed for moderate pain.  Marland Kitchen umeclidinium-vilanterol (ANORO ELLIPTA) 62.5-25 MCG/INH AEPB Inhale 1 puff into the lungs daily.  . [DISCONTINUED] potassium chloride (KLOR-CON) 20 MEQ packet Take 40 mEq by mouth every other day.     Allergies:   Lisinopril   Social History   Tobacco Use  . Smoking status: Former Smoker    Packs/day: 1.00    Years: 50.00    Pack years: 50.00    Types: Cigarettes    Start date: 09/02/1960    Last attempt to quit: 03/29/2008    Years since quitting: 10.4  . Smokeless tobacco: Never Used  Substance Use Topics  . Alcohol use: No    Alcohol/week: 0.0 standard drinks  . Drug use: Never     Family Hx: The patient's family history includes Cancer in her paternal grandmother; Hypertension in her brother and maternal grandmother.  ROS:   Please see the history of present illness.     All other systems reviewed and are negative.   Prior CV studies:   The following studies were reviewed today:  Echocardiogram performed on 03/30/2018:  - Left ventricle: The cavity size was mildly dilated. Wall   thickness was increased in a pattern of moderate LVH. Systolic   function was severely reduced. The estimated ejection fraction   was in the range of 20% to 25%. Diffuse hypokinesis. Features are   consistent with a pseudonormal left ventricular filling pattern,   with concomitant abnormal relaxation and increased filling   pressure (grade 2 diastolic dysfunction). Doppler  parameters are   consistent with high ventricular filling pressure. - Mitral valve: Calcified annulus. Mildly thickened leaflets .   There was mild to moderate regurgitation. - Left atrium: The atrium was mildly dilated. - Pulmonary arteries: Systolic pressure was moderately increased.   PA peak pressure: 49 mm Hg (S).  Impressions:  - Severe global reduction in LV systolic function; moderate   diastolic dysfunction with elevated LV filling pressure; moderate   LVH; mild LVE; mild to moderate MR; mild LAE; mild TR with   moderate pulmonary hypertension.   Coronary angiography on 06/03/17 demonstrated mild nonobstructive disease with mildly elevated right heart pressures with volume overload. Therewas a mid RCA 25% stenosis and a 25% stenosis of the ostium of the second diagonal  branch.  Labs/Other Tests and Data Reviewed:    EKG:  No ECG reviewed.  Recent Labs: 03/28/2018: B Natriuretic Peptide 776.4 03/29/2018: TSH 1.033 03/30/2018: ALT 14 04/04/2018: BUN 16; Creatinine, Ser 1.11; Hemoglobin 10.1; Magnesium 1.7; Platelets 411; Potassium 3.5; Sodium 139   Recent Lipid Panel Lab Results  Component Value Date/Time   CHOL 93 02/11/2018 04:47 AM   TRIG 42 02/11/2018 04:47 AM   HDL 33 (L) 02/11/2018 04:47 AM   CHOLHDL 2.8 02/11/2018 04:47 AM   LDLCALC 52 02/11/2018 04:47 AM    Wt Readings from Last 3 Encounters:  04/04/18 125 lb 14.4 oz (57.1 kg)  02/16/18 188 lb 14.9 oz (85.7 kg)  02/13/18 151 lb 0.2 oz (68.5 kg)     Objective:    Vital Signs:  BP 104/60   Pulse 62   Ht 5\' 4"  (1.626 m)   SpO2 100%   BMI 21.61 kg/m    VITAL SIGNS:  reviewed  ASSESSMENT & PLAN:    1.  Paroxysmal atrial fibrillation: Anticoagulated with Eliquis.  Heart rate controlled on carvedilol.  Symptomatically stable.  No changes.  2.  Chronic combined CHF: LVEF 20 to 25%.  She does not appear to be hypervolemic.  Continue carvedilol, spironolactone, and Imdur. Is no longer on hydralazine. She  takes 40 mg of Lasix with supplemental potassium every other day (as requested by her brother, an MD, at last visit).  She can take an extra 40 mg of Lasix with potassium should weight increased by 3 pounds in 24 hours or 5 pounds in a week.  She developed angioedema with lisinopril and thus ACE inhibitors and angiotensin receptor blockers are being avoided.  Creatinine 1.11 on 04/04/2018.  I will obtain a follow-up echocardiogram (limited to assess LVEF). If LVEF remains reduced, I would refer to EP for AICD consideration.  3.  History of recurrent CVA: Continue apixaban and atorvastatin.  4.  Hypertension: BP is normal.  No changes.  5.  Hyperlipidemia: LDL 52 on 02/11/2018.  Continue atorvastatin.   COVID-19 Education: The signs and symptoms of COVID-19 were discussed with the patient and how to seek care for testing (follow up with PCP or arrange E-visit).  The importance of social distancing was discussed today.  Time:   Today, I have spent 15 minutes with the patient with telehealth technology discussing the above problems.     Medication Adjustments/Labs and Tests Ordered: Current medicines are reviewed at length with the patient today.  Concerns regarding medicines are outlined above.   Tests Ordered: No orders of the defined types were placed in this encounter.   Medication Changes: No orders of the defined types were placed in this encounter.   Disposition:  Follow up in 4 month(s)  Signed, Kate Sable, MD  08/31/2018 1:11 PM    [place]

## 2018-09-04 ENCOUNTER — Telehealth: Payer: Self-pay | Admitting: Cardiovascular Disease

## 2018-09-04 NOTE — Telephone Encounter (Signed)
Pt was called by Gwenyth Allegra to remind them of echo apt tomorrow, husband said he was on the other line, needs to arrange transportation, to cx apt and he will call back

## 2018-09-04 NOTE — Telephone Encounter (Signed)
Returning someones call 

## 2018-09-05 ENCOUNTER — Other Ambulatory Visit: Payer: Self-pay | Admitting: Cardiovascular Disease

## 2018-09-05 ENCOUNTER — Telehealth: Payer: Self-pay | Admitting: Cardiovascular Disease

## 2018-09-05 ENCOUNTER — Inpatient Hospital Stay (HOSPITAL_COMMUNITY): Admission: RE | Admit: 2018-09-05 | Payer: Medicare Other | Source: Ambulatory Visit

## 2018-09-05 DIAGNOSIS — I639 Cerebral infarction, unspecified: Secondary | ICD-10-CM

## 2018-09-05 MED ORDER — CARVEDILOL 6.25 MG PO TABS: 6.2500 mg | ORAL_TABLET | Freq: Two times a day (BID) | ORAL | 3 refills | Status: DC

## 2018-09-05 NOTE — Telephone Encounter (Signed)
[name] caretaker notified. Sent in RX for 6.25 bid to [facility].

## 2018-09-05 NOTE — Telephone Encounter (Signed)
[name] caretaker [name]) states that over the past few days, the pt's blood pressure has been  Relatively low. (98/68, 92/64.94/70) and she has been very tired. She does not have any energy. He stated that since increasing her dose of carvedilol to 12.5 mg two times daily, she has felt horrible. Please advise.

## 2018-09-05 NOTE — Telephone Encounter (Signed)
Reduce back to 6.25 mg bid.

## 2018-09-05 NOTE — Telephone Encounter (Signed)
[facility] notified that current dose of Coreg is 6.25 mg BID

## 2018-09-05 NOTE — Telephone Encounter (Signed)
Novella Olive (caregiver) called stating that patient is feeling very tired. She has a question about patient's medication. [phone])

## 2018-09-05 NOTE — Telephone Encounter (Signed)
Please call [facility] [phone] concerning pt's carvedilol (COREG) 6.25 MG tablet [[phone]]

## 2018-09-07 ENCOUNTER — Telehealth: Payer: Self-pay | Admitting: Cardiovascular Disease

## 2018-09-07 NOTE — Telephone Encounter (Signed)
Mr. Dragon called stating that they need to discuss patient's medications.  [phone] or [phone].

## 2018-09-07 NOTE — Telephone Encounter (Signed)
Pt wanted to make sure meds were correct. Went over list.

## 2018-09-22 ENCOUNTER — Telehealth: Payer: Self-pay | Admitting: Cardiovascular Disease

## 2018-09-22 NOTE — Telephone Encounter (Signed)
DC the spironolactone.

## 2018-09-22 NOTE — Telephone Encounter (Signed)
BP is 95/74 and pt is asymatomatic at this time

## 2018-09-22 NOTE — Telephone Encounter (Signed)
How about BP? She did not tolerate the higher dose of Coreg. This HR looks much more reasonable. If she is asymptomatic, I would continue to monitor vitals.

## 2018-09-22 NOTE — Telephone Encounter (Signed)
Patient's caretaker called in regards to questions about patient's vital signs/tg

## 2018-09-22 NOTE — Telephone Encounter (Signed)
Caregiver notified and voiced understanding

## 2018-09-22 NOTE — Telephone Encounter (Signed)
Brandy [name] called back to report BP unchanged and HR between 74 and 103. Pt denies being SOB and having Chest pain at this time. Brandy [name] can be reached at [phone].

## 2018-09-22 NOTE — Telephone Encounter (Signed)
Spoke with [place] who states that Pt heat rate is elevated at 124 pt's BP is 94/60 O2 sat is 97%. Pt denies SOB and CP at this time. Harris states that Pt has not taken her morning meds and will do so after eating. Kenton Kingfisher will call back with another heart rate and BP an hour after pt has taken morning meds.

## 2018-09-22 NOTE — Telephone Encounter (Signed)
Ok, will await follow up vitals.

## 2018-09-25 ENCOUNTER — Telehealth: Payer: Self-pay | Admitting: Cardiovascular Disease

## 2018-09-25 NOTE — Telephone Encounter (Signed)
Patient was taken off of Spironolactone last week. Patient's caretaker calling to report HR is still 110. Calling to report to nurse. / tg

## 2018-09-26 NOTE — Telephone Encounter (Signed)
LMTCB with pt's caretaker

## 2018-09-26 NOTE — Telephone Encounter (Signed)
What is BP?

## 2018-09-28 ENCOUNTER — Telehealth: Payer: Self-pay | Admitting: Cardiovascular Disease

## 2018-09-28 NOTE — Telephone Encounter (Signed)
Brandy [name] notified and voiced understanding

## 2018-09-28 NOTE — Telephone Encounter (Signed)
Per pt's CMA Irene Pap--   Pulse is down to 82  Oxygen is 99  Temp is 98  This is after the medication changes.

## 2018-09-28 NOTE — Telephone Encounter (Signed)
Will forward to Dr. Koneswaran  

## 2018-09-28 NOTE — Telephone Encounter (Signed)
Thanks for the update

## 2018-10-09 ENCOUNTER — Telehealth: Payer: Self-pay | Admitting: Cardiovascular Disease

## 2018-10-09 NOTE — Telephone Encounter (Signed)
Received call from patients brother who states he is a physician.He says patient had visit with Dr.Hill today and noted her BP was 86/64 HR 90.He states Dr.Hill decreased Coreg to 3.125 mg BID and told her to HOLD Lasix. Brother states patient is not eating well but did not have her weight.He also states they did lab work today which I will request, along with office note

## 2018-10-09 NOTE — Telephone Encounter (Signed)
Patient has medication questions. °

## 2018-10-09 NOTE — Telephone Encounter (Signed)
Patient had med changes from Dr.Hill today, will request office note

## 2018-10-09 NOTE — Telephone Encounter (Signed)
Ok, thanks for the udpate.

## 2018-10-10 ENCOUNTER — Encounter (HOSPITAL_COMMUNITY): Payer: Self-pay | Admitting: *Deleted

## 2018-10-10 ENCOUNTER — Emergency Department (HOSPITAL_COMMUNITY): Payer: Medicare Other

## 2018-10-10 ENCOUNTER — Inpatient Hospital Stay (HOSPITAL_COMMUNITY)
Admission: EM | Admit: 2018-10-10 | Discharge: 2018-10-15 | DRG: 682 | Disposition: A | Discharge status: Home Health | Payer: Medicare Other | Attending: Internal Medicine | Admitting: Internal Medicine

## 2018-10-10 ENCOUNTER — Other Ambulatory Visit: Payer: Self-pay

## 2018-10-10 DIAGNOSIS — M8580 Other specified disorders of bone density and structure, unspecified site: Secondary | ICD-10-CM | POA: Diagnosis present

## 2018-10-10 DIAGNOSIS — Z87891 Personal history of nicotine dependence: Secondary | ICD-10-CM

## 2018-10-10 DIAGNOSIS — Z981 Arthrodesis status: Secondary | ICD-10-CM

## 2018-10-10 DIAGNOSIS — I10 Essential (primary) hypertension: Secondary | ICD-10-CM | POA: Diagnosis present

## 2018-10-10 DIAGNOSIS — I420 Dilated cardiomyopathy: Secondary | ICD-10-CM | POA: Diagnosis present

## 2018-10-10 DIAGNOSIS — R627 Adult failure to thrive: Secondary | ICD-10-CM

## 2018-10-10 DIAGNOSIS — Z888 Allergy status to other drugs, medicaments and biological substances status: Secondary | ICD-10-CM

## 2018-10-10 DIAGNOSIS — R7989 Other specified abnormal findings of blood chemistry: Secondary | ICD-10-CM | POA: Diagnosis present

## 2018-10-10 DIAGNOSIS — E872 Acidosis, unspecified: Secondary | ICD-10-CM

## 2018-10-10 DIAGNOSIS — E860 Dehydration: Secondary | ICD-10-CM | POA: Diagnosis present

## 2018-10-10 DIAGNOSIS — Z6821 Body mass index (BMI) 21.0-21.9, adult: Secondary | ICD-10-CM

## 2018-10-10 DIAGNOSIS — N179 Acute kidney failure, unspecified: Secondary | ICD-10-CM | POA: Diagnosis present

## 2018-10-10 DIAGNOSIS — Z1159 Encounter for screening for other viral diseases: Secondary | ICD-10-CM

## 2018-10-10 DIAGNOSIS — E785 Hyperlipidemia, unspecified: Secondary | ICD-10-CM | POA: Diagnosis present

## 2018-10-10 DIAGNOSIS — I5042 Chronic combined systolic (congestive) and diastolic (congestive) heart failure: Secondary | ICD-10-CM | POA: Diagnosis present

## 2018-10-10 DIAGNOSIS — E43 Unspecified severe protein-calorie malnutrition: Secondary | ICD-10-CM | POA: Insufficient documentation

## 2018-10-10 DIAGNOSIS — Z79899 Other long term (current) drug therapy: Secondary | ICD-10-CM

## 2018-10-10 DIAGNOSIS — Z993 Dependence on wheelchair: Secondary | ICD-10-CM

## 2018-10-10 DIAGNOSIS — E870 Hyperosmolality and hypernatremia: Secondary | ICD-10-CM | POA: Diagnosis present

## 2018-10-10 DIAGNOSIS — K5730 Diverticulosis of large intestine without perforation or abscess without bleeding: Secondary | ICD-10-CM | POA: Diagnosis present

## 2018-10-10 DIAGNOSIS — M79673 Pain in unspecified foot: Secondary | ICD-10-CM

## 2018-10-10 DIAGNOSIS — E876 Hypokalemia: Secondary | ICD-10-CM | POA: Diagnosis present

## 2018-10-10 DIAGNOSIS — I480 Paroxysmal atrial fibrillation: Secondary | ICD-10-CM | POA: Diagnosis present

## 2018-10-10 DIAGNOSIS — Z7189 Other specified counseling: Secondary | ICD-10-CM

## 2018-10-10 DIAGNOSIS — Z7901 Long term (current) use of anticoagulants: Secondary | ICD-10-CM

## 2018-10-10 DIAGNOSIS — N19 Unspecified kidney failure: Secondary | ICD-10-CM | POA: Diagnosis present

## 2018-10-10 DIAGNOSIS — I69354 Hemiplegia and hemiparesis following cerebral infarction affecting left non-dominant side: Secondary | ICD-10-CM

## 2018-10-10 DIAGNOSIS — Z7401 Bed confinement status: Secondary | ICD-10-CM

## 2018-10-10 DIAGNOSIS — I130 Hypertensive heart and chronic kidney disease with heart failure and stage 1 through stage 4 chronic kidney disease, or unspecified chronic kidney disease: Secondary | ICD-10-CM | POA: Diagnosis present

## 2018-10-10 DIAGNOSIS — J449 Chronic obstructive pulmonary disease, unspecified: Secondary | ICD-10-CM | POA: Diagnosis present

## 2018-10-10 DIAGNOSIS — E869 Volume depletion, unspecified: Secondary | ICD-10-CM | POA: Diagnosis present

## 2018-10-10 DIAGNOSIS — Z8249 Family history of ischemic heart disease and other diseases of the circulatory system: Secondary | ICD-10-CM

## 2018-10-10 DIAGNOSIS — N183 Chronic kidney disease, stage 3 (moderate): Secondary | ICD-10-CM | POA: Diagnosis present

## 2018-10-10 DIAGNOSIS — Z515 Encounter for palliative care: Secondary | ICD-10-CM

## 2018-10-10 DIAGNOSIS — M79671 Pain in right foot: Secondary | ICD-10-CM | POA: Diagnosis not present

## 2018-10-10 HISTORY — DX: Paroxysmal atrial fibrillation: I48.0

## 2018-10-10 LAB — CBC WITH DIFFERENTIAL/PLATELET
Abs Immature Granulocytes: 0.04 10*3/uL (ref 0.00–0.07)
Basophils Absolute: 0 10*3/uL (ref 0.0–0.1)
Basophils Relative: 0 %
Eosinophils Absolute: 0.2 10*3/uL (ref 0.0–0.5)
Eosinophils Relative: 2 %
HCT: 44.6 % (ref 36.0–46.0)
Hemoglobin: 13.3 g/dL (ref 12.0–15.0)
Immature Granulocytes: 0 %
Lymphocytes Relative: 25 %
Lymphs Abs: 2.5 10*3/uL (ref 0.7–4.0)
MCH: 28.4 pg (ref 26.0–34.0)
MCHC: 29.8 g/dL — ABNORMAL LOW (ref 30.0–36.0)
MCV: 95.3 fL (ref 80.0–100.0)
Monocytes Absolute: 0.7 10*3/uL (ref 0.1–1.0)
Monocytes Relative: 7 %
Neutro Abs: 6.5 10*3/uL (ref 1.7–7.7)
Neutrophils Relative %: 66 %
Platelets: 247 10*3/uL (ref 150–400)
RBC: 4.68 MIL/uL (ref 3.87–5.11)
RDW: 14.2 % (ref 11.5–15.5)
WBC: 9.9 10*3/uL (ref 4.0–10.5)
nRBC: 0 % (ref 0.0–0.2)

## 2018-10-10 LAB — COMPREHENSIVE METABOLIC PANEL
ALT: 16 U/L (ref 0–44)
AST: 30 U/L (ref 15–41)
Albumin: 3.8 g/dL (ref 3.5–5.0)
Alkaline Phosphatase: 54 U/L (ref 38–126)
Anion gap: 8 (ref 5–15)
BUN: 94 mg/dL — ABNORMAL HIGH (ref 8–23)
CO2: 21 mmol/L — ABNORMAL LOW (ref 22–32)
Calcium: 10 mg/dL (ref 8.9–10.3)
Chloride: 122 mmol/L — ABNORMAL HIGH (ref 98–111)
Creatinine, Ser: 2.58 mg/dL — ABNORMAL HIGH (ref 0.44–1.00)
GFR calc Af Amer: 20 mL/min — ABNORMAL LOW (ref >60)
GFR calc non Af Amer: 17 mL/min — ABNORMAL LOW (ref >60)
Glucose, Bld: 117 mg/dL — ABNORMAL HIGH (ref 70–99)
Potassium: 4.9 mmol/L (ref 3.5–5.1)
Sodium: 151 mmol/L — ABNORMAL HIGH (ref 135–145)
Total Bilirubin: 0.5 mg/dL (ref 0.3–1.2)
Total Protein: 7.1 g/dL (ref 6.5–8.1)

## 2018-10-10 LAB — LACTIC ACID, PLASMA
Lactic Acid, Venous: 2.4 mmol/L (ref 0.5–1.9)
Lactic Acid, Venous: 1.2 mmol/L (ref 0.5–1.9)

## 2018-10-10 LAB — URINALYSIS, ROUTINE W REFLEX MICROSCOPIC
Bilirubin Urine: NEGATIVE
Glucose, UA: NEGATIVE mg/dL
Hgb urine dipstick: NEGATIVE
Ketones, ur: NEGATIVE mg/dL
Leukocytes,Ua: NEGATIVE
Nitrite: NEGATIVE
Protein, ur: NEGATIVE mg/dL
Specific Gravity, Urine: 1.014 (ref 1.005–1.030)
pH: 5 (ref 5.0–8.0)

## 2018-10-10 LAB — SARS CORONAVIRUS 2 BY RT PCR (HOSPITAL ORDER, PERFORMED IN ~~LOC~~ HOSPITAL LAB): SARS Coronavirus 2: NEGATIVE

## 2018-10-10 MED ORDER — DEXTROSE 5 % IV SOLN
INTRAVENOUS | Status: DC
  Administered 2018-10-11 – 2018-10-12 (×4): via INTRAVENOUS

## 2018-10-10 MED ORDER — SODIUM CHLORIDE 0.9 % IV BOLUS
500.0000 mL | Freq: Once | INTRAVENOUS | Status: AC
  Administered 2018-10-10: 500 mL via INTRAVENOUS

## 2018-10-10 NOTE — ED Notes (Signed)
Date and time results received: 10/10/18 2122 (use smartphrase ".now" to insert current time)  Test:Lactic Acid Critical Value: 2.4 Name of Provider Notified: Langston Masker PA Orders Received? Or Actions Taken?: NA

## 2018-10-10 NOTE — H&P (Signed)
History and Physical    [name]:[id number] DOB: 07-31-1940 DOA: 10/10/2018  PCP: [name], MD   Patient coming from: Home.  I have personally briefly reviewed patient's old medical records in [place]  Chief Complaint: Dehydration  HPI: Brandy [name] is a 78 y.o. female with medical history significant of hypertensive heart disease, dilated cardiomyopathy, chronic combined systolic and diastolic CHF, colon polyps, diverticulosis, hypertension, osteopenia, paroxysmal A. fib, history of CVA with residual left-sided hemiparesis/hemiplegia, history of 50-pack-year tobacco use who is coming to the emergency department due to dehydration after he saw the patient's electrolytes and renal function.  Recently, there have been some adjustments to her diuretic medication.  ED Course: Initial vital signs temperature 98 F, pulse 92, respirations 17, blood pressure 94/76 mmHg and O2 sat 98% on room air.  Her CBC was essentially normal.  Sodium 151, potassium 4.9, chloride 122 and CO2 21 mmol/L.  Water deficit is about a liter.  Glucose 117, BUN 94 and creatinine 2.58 mg/dL.  LFTs are normal.  Lactic acid was 2.4 and then 1.2 mmol/L.  Her chest radiograph did not have any acute cardiopulmonary pathology.  Review of Systems: Unable to obtain.  Past Medical History:  Diagnosis Date  . Cardiomyopathy    a. EF of 25% in 07/2006 b. EF normalized by repeat echo in 2011 c. EF 35-40% by echo in 05/2017 with cath showing mild nonobstructive CAD  . Chronic combined systolic and diastolic CHF (congestive heart failure) ([place])   . Colon polyps   . Diverticulosis of colon   . Hypertension   . Hypertensive heart disease   . Osteopenia   . Paroxysmal atrial fibrillation ([place]) 10/10/2018  . Stroke ([place])   . TIA (transient ischemic attack)   . Tobacco abuse    50 pack year    Past Surgical History:  Procedure Laterality Date  . BREAST BIOPSY    . CARPAL TUNNEL RELEASE     right  hand  . CERVICAL FUSION    . CESAREAN SECTION     2 times  . COLONOSCOPY  7824   pt uncertain as to whether polypectomy required or performed  . ECTOPIC PREGNANCY SURGERY    . IR ANGIO VERTEBRAL SEL SUBCLAVIAN INNOMINATE UNI R MOD SED  02/10/2018  . IR CT HEAD LTD  02/10/2018  . IR PERCUTANEOUS ART THROMBECTOMY/INFUSION INTRACRANIAL INC DIAG ANGIO  02/10/2018  . RADIOLOGY WITH ANESTHESIA N/A 02/10/2018   Procedure: RADIOLOGY WITH ANESTHESIA;  Surgeon: Luanne Bras, MD;  Location: [facility];  Service: Radiology;  Laterality: N/A;  . RIGHT/LEFT HEART CATH AND CORONARY ANGIOGRAPHY N/A 06/03/2017   Procedure: RIGHT/LEFT HEART CATH AND CORONARY ANGIOGRAPHY;  Surgeon: Jettie Booze, MD;  Location: [facility];  Service: Cardiovascular;  Laterality: N/A;  . TEE WITHOUT CARDIOVERSION N/A 02/13/2018   Procedure: TRANSESOPHAGEAL ECHOCARDIOGRAM (TEE);  Surgeon: Sueanne Margarita, MD;  Location: [facility] ENDOSCOPY;  Service: Cardiovascular;  Laterality: N/A;  . TONSILLECTOMY       reports that she quit smoking about 10 years ago. Her smoking use included cigarettes. She started smoking about 58 years ago. She has a 50.00 pack-year smoking history. She has never used smokeless tobacco. She reports that she does not drink alcohol or use drugs.  Allergies  Allergen Reactions  . Lisinopril Swelling    Family History  Problem Relation Age of Onset  . Hypertension Brother   . Hypertension Maternal Grandmother   . Cancer Paternal Grandmother  Prior to Admission medications   Medication Sig Start Date End Date Taking? Authorizing Provider  acetaminophen (TYLENOL) 325 MG tablet Take 2 tablets (650 mg total) by mouth every 4 (four) hours as needed for mild pain (or temp > 37.5 C (99.5 F)). Patient taking differently: Take 650 mg by mouth every 6 (six) hours as needed for mild pain or moderate pain.  03/16/18  Yes Angiulli, Lavon Paganini, PA-C  apixaban (ELIQUIS) 5 MG TABS tablet Take 1 tablet (5 mg  total) by mouth 2 (two) times daily. 04/04/18  Yes Aline August, MD  atorvastatin (LIPITOR) 10 MG tablet TAKE 1 TABLET BY MOUTH EVERY DAY AT 6 PM Patient taking differently: Take 5 mg by mouth every morning.  09/05/18  Yes Herminio Commons, MD  carvedilol (COREG) 6.25 MG tablet Take 1 tablet (6.25 mg total) by mouth 2 (two) times daily. Patient taking differently: Take 3.125 mg by mouth 2 (two) times daily.  09/05/18  Yes Herminio Commons, MD  citalopram (CELEXA) 10 MG tablet Take 10 mg by mouth daily.   Yes [provider]  donepezil (ARICEPT) 5 MG tablet Take 1 tablet by mouth at bedtime. 11/15/17  Yes [provider]  isosorbide mononitrate (IMDUR) 30 MG 24 hr tablet TAKE 1/2 TABLET(15 MG) BY MOUTH DAILY Patient taking differently: Take 15 mg by mouth every morning.  09/05/18  Yes Herminio Commons, MD  lactobacillus acidophilus (BACID) TABS tablet Take 1 tablet by mouth daily.   Yes [provider]  megestrol (MEGACE) 20 MG tablet Take 100 mg by mouth 2 (two) times a day.   Yes [provider]  umeclidinium-vilanterol (ANORO ELLIPTA) 62.5-25 MCG/INH AEPB Inhale 1 puff into the lungs daily. 03/16/18  Yes Angiulli, Lavon Paganini, PA-C  furosemide (LASIX) 40 MG tablet Take twice a day EVERY OTHER DAY 06/23/18   Herminio Commons, MD  methylphenidate (RITALIN) 5 MG tablet Take 1 tablet (5 mg total) by mouth 2 (two) times daily with breakfast and lunch. 03/16/18   Angiulli, Lavon Paganini, PA-C  potassium chloride SA (K-DUR,KLOR-CON) 20 MEQ tablet TAKE 2 TABLETS(40 MEQ) BY MOUTH DAILY Patient taking differently: Take 40 mEq by mouth daily.  07/04/18   Dunn, Nedra Hai, PA-C  spironolactone (ALDACTONE) 25 MG tablet Take 0.5 tablets (12.5 mg total) by mouth daily. 04/05/18   Aline August, MD  traMADol (ULTRAM) 50 MG tablet Take 1 tablet (50 mg total) by mouth every 6 (six) hours as needed for moderate pain. 04/04/18   Aline August, MD    Physical Exam: Vitals:   10/10/18  2030 10/10/18 2130 10/10/18 2200 10/10/18 2330  BP: 95/73 107/67 (!) 102/59 95/67  Pulse: 91 78 68   Resp:  14 15 17   Temp:      TempSrc:      SpO2: 96% 97% 100%   Weight:        Constitutional: NAD, calm, comfortable Eyes: PERRL, lids and conjunctivae normal ENMT: Mucous membranes are mildly dry.  Posterior pharynx clear of any exudate or lesions. Neck: normal, supple, no masses, no thyromegaly Respiratory: clear to auscultation bilaterally, no wheezing, no crackles. Normal respiratory effort. No accessory muscle use.  Cardiovascular: Regular rate and rhythm, no murmurs / rubs / gallops. No extremity edema. 2+ pedal pulses. No carotid bruits.  Abdomen: Soft, no tenderness, no masses palpated. No hepatosplenomegaly. Bowel sounds positive.  Musculoskeletal: no clubbing / cyanosis.  Positive muscle atrophy left side extremities.. Good ROM, no contractures. Normal muscle tone.  Skin: no rashes, lesions, ulcers on very limited dermatological examination. Neurologic: CN 2-12 grossly intact. Sensation intact, DTR normal.  1/5 left-sided hemiparesis Psychiatric:  Alert and oriented x 3.  Mostly nonverbal, but able to signal yes or no.  Labs on Admission: I have personally reviewed following labs and imaging studies  CBC: Recent Labs  Lab 10/10/18 2041  WBC 9.9  NEUTROABS 6.5  HGB 13.3  HCT 44.6  MCV 95.3  PLT 378   Basic Metabolic Panel: Recent Labs  Lab 10/10/18 2041  NA 151*  K 4.9  CL 122*  CO2 21*  GLUCOSE 117*  BUN 94*  CREATININE 2.58*  CALCIUM 10.0   GFR: Estimated Creatinine Clearance: 16 mL/min (A) (by C-G formula based on SCr of 2.58 mg/dL (H)). Liver Function Tests: Recent Labs  Lab 10/10/18 2041  AST 30  ALT 16  ALKPHOS 54  BILITOT 0.5  PROT 7.1  ALBUMIN 3.8   No results for input(s): LIPASE, AMYLASE in the last 168 hours. No results for input(s): AMMONIA in the last 168 hours. Coagulation Profile: No results for input(s): INR, PROTIME in the last  168 hours. Cardiac Enzymes: No results for input(s): CKTOTAL, CKMB, CKMBINDEX, TROPONINI in the last 168 hours. BNP (last 3 results) No results for input(s): PROBNP in the last 8760 hours. HbA1C: No results for input(s): HGBA1C in the last 72 hours. CBG: No results for input(s): GLUCAP in the last 168 hours. Lipid Profile: No results for input(s): CHOL, HDL, LDLCALC, TRIG, CHOLHDL, LDLDIRECT in the last 72 hours. Thyroid Function Tests: No results for input(s): TSH, T4TOTAL, FREET4, T3FREE, THYROIDAB in the last 72 hours. Anemia Panel: No results for input(s): VITAMINB12, FOLATE, FERRITIN, TIBC, IRON, RETICCTPCT in the last 72 hours. Urine analysis:    Component Value Date/Time   COLORURINE YELLOW 10/10/2018 1951   APPEARANCEUR CLEAR 10/10/2018 1951   LABSPEC 1.014 10/10/2018 1951   PHURINE 5.0 10/10/2018 1951   GLUCOSEU NEGATIVE 10/10/2018 1951   HGBUR NEGATIVE 10/10/2018 1951   BILIRUBINUR NEGATIVE 10/10/2018 [place] NEGATIVE 10/10/2018 1951   PROTEINUR NEGATIVE 10/10/2018 1951   NITRITE NEGATIVE 10/10/2018 1951   LEUKOCYTESUR NEGATIVE 10/10/2018 1951    Radiological Exams on Admission: Dg Chest Portable 1 View  Result Date: 10/10/2018 CLINICAL DATA:  Pt with decreased appetite and weakness. Pt had recent stroke, so best image possible due to rigidity. Hx CHF, HTNWeakness EXAM: PORTABLE CHEST 1 VIEW COMPARISON:  Radiograph 03/29/2018 FINDINGS: Normal cardiac silhouette. Low lung volumes. No effusion, infiltrate or pneumothorax. Anterior cervical fusion. IMPRESSION: No acute cardiopulmonary process. Electronically Signed   By: Suzy Bouchard M.D.   On: 10/10/2018 20:56    EKG: Independently reviewed.  Vent. rate 92 BPM PR interval * ms QRS duration 114 ms QT/QTc 366/453 ms P-R-T axes 98 -57 108 Sinus rhythm Left axis deviation LVH with secondary repolarization abnormality Inferior infarct, old Anterior infarct, old  Assessment/Plan Principal Problem:    Acute prerenal azotemia   AKI (acute kidney injury) ([place]) Observation/telemetry. Dextrose 5% 50 mL/h. Encourage p.o. fluids. Monitor intake and output. Hold diuretics. Follow-up renal function and electrolytes.  Active Problems:   Hypernatremia As above. Monitor sodium level.    Hypertension Hold furosemide. Hold spironolactone. Monitor BP and electrolytes.    Chronic combined systolic and diastolic congestive heart failure (HCC) Continue carvedilol 3.125 mg p.o. twice daily. Hold Aldactone and Lasix.    Hemiparesis affecting left side as late effect of stroke ([place]) Supportive care. Continue anticoagulation for A. fib.  Paroxysmal atrial fibrillation (HCC) CHA?DS?-VASc Score of at least 8. Continue apixaban. Continue Coreg 3.125 mg p.o. twice daily   DVT prophylaxis: On apixaban. Code Status: Full code. Family Communication: Her husband was present in the ED. Disposition Plan: Observation for gentle hypotonic fluid IV hydration. Consults called: Admission status: Telemetry/observation.   Reubin Milan MD [facility]  10/10/2018, 11:45 PM   This document was prepared using Dragon voice recognition software and may contain some unintended transcription errors.

## 2018-10-10 NOTE — ED Notes (Signed)
According to pt's daughter pt has had a decrease in appetite for the last 3 weeks; pt's daughter stated they have discussed pt having a feeding tube if needed

## 2018-10-10 NOTE — ED Notes (Signed)
Radiology at bedside

## 2018-10-10 NOTE — ED Provider Notes (Signed)
[facility] EMERGENCY DEPARTMENT Provider Note   CSN: [id number] Arrival date & time: 10/10/18  1929     History   Chief Complaint Chief Complaint  Patient presents with  . abnormal labs    HPI Brandy [name] is a 78 y.o. female.     HPI   Patient is a 66-year female the past medical history of cardiomyopathy, EF of 25%, atrial fibrillation on Eliquis, hypertension, CVA with residual left-sided hemiparesis and aphasia presenting for abnormal labs.  She was referred to the emergency department by her primary care provider after she had lab work today showing that she was "dehydrated".  According to patient's husband, patient has not been wanting to eat over the past couple weeks.  She is nonambulatory nonverbal at baseline, but is alert and responsive with communication by nodding.  There has been no nausea, vomiting or diarrhea at home.  No reports of fever or chills.  Past Medical History:  Diagnosis Date  . Cardiomyopathy    a. EF of 25% in 07/2006 b. EF normalized by repeat echo in 2011 c. EF 35-40% by echo in 05/2017 with cath showing mild nonobstructive CAD  . Chronic combined systolic and diastolic CHF (congestive heart failure) ([place])   . Colon polyps   . Diverticulosis of colon   . Hypertension   . Hypertensive heart disease   . Osteopenia   . Stroke ([place])   . TIA (transient ischemic attack)   . Tobacco abuse    50 pack year    Patient Active Problem List   Diagnosis Date Noted  . Abnormality of gait 05/04/2018  . Acute exacerbation of congestive heart failure ([place]) 03/28/2018  . Left hemiplegia ([place])   . Chronic kidney disease   . Hypokalemia   . Acute on chronic anemia   . Hypoalbuminemia due to protein-calorie malnutrition ([place])   . Hemiparesis affecting left side as late effect of stroke ([place]) 02/16/2018  . Left-sided neglect 02/16/2018  . Right middle cerebral artery stroke ([place]) 02/16/2018  . Acute embolic stroke ([place])   . Acute right MCA stroke  ([place])   . History of CVA with residual deficit   . Benign essential HTN   . Chronic combined systolic and diastolic congestive heart failure ([place])   . History of fusion of cervical spine   . Stage 3 chronic kidney disease ([place])   . Acute blood loss anemia   . Stroke ([place]) 02/10/2018  . Middle cerebral artery embolism, right 02/10/2018  . Health care maintenance 11/01/2017  . Non-ischemic cardiomyopathy ([place]) 09/14/2017  . Precordial chest pain 09/14/2017  . B12 deficiency 09/13/2017  . Tachycardia 09/13/2017  . Acute ischemic stroke ([place])   . Seasonal allergies 07/13/2017  . TIA (transient ischemic attack) 07/12/2017  . Anemia 07/12/2017  . Pneumonia 06/02/2017  . Pleural effusion 06/02/2017  . Acute on chronic systolic congestive heart failure ([place])   . Elevated troponin   . Other fatigue 05/04/2017  . Dyspnea 05/04/2017  . Pseudophakia of both eyes 08/06/2016  . Chronic combined systolic and diastolic CHF (congestive heart failure) ([place]) 07/09/2016  . Hypertensive heart disease   . Hypertension   . Nuclear sclerosis, bilateral 05/24/2016  . HTN (hypertension) 01/22/2013  . TOBACCO ABUSE 02/18/2008  . CARDIOMYOPATHY 02/18/2008  . Diverticulosis of colon 02/18/2008  . OSTEOPENIA 02/18/2008  . Disorder of bone and cartilage 02/18/2008    Past Surgical History:  Procedure Laterality Date  . BREAST BIOPSY    . CARPAL TUNNEL  RELEASE     right hand  . CERVICAL FUSION    . CESAREAN SECTION     2 times  . COLONOSCOPY  5809   pt uncertain as to whether polypectomy required or performed  . ECTOPIC PREGNANCY SURGERY    . IR ANGIO VERTEBRAL SEL SUBCLAVIAN INNOMINATE UNI R MOD SED  02/10/2018  . IR CT HEAD LTD  02/10/2018  . IR PERCUTANEOUS ART THROMBECTOMY/INFUSION INTRACRANIAL INC DIAG ANGIO  02/10/2018  . RADIOLOGY WITH ANESTHESIA N/A 02/10/2018   Procedure: RADIOLOGY WITH ANESTHESIA;  Surgeon: Luanne Bras, MD;  Location: [facility];  Service: Radiology;  Laterality: N/A;   . RIGHT/LEFT HEART CATH AND CORONARY ANGIOGRAPHY N/A 06/03/2017   Procedure: RIGHT/LEFT HEART CATH AND CORONARY ANGIOGRAPHY;  Surgeon: Jettie Booze, MD;  Location: [place] [facility];  Service: Cardiovascular;  Laterality: N/A;  . TEE WITHOUT CARDIOVERSION N/A 02/13/2018   Procedure: TRANSESOPHAGEAL ECHOCARDIOGRAM (TEE);  Surgeon: Sueanne Margarita, MD;  Location: [facility] ENDOSCOPY;  Service: Cardiovascular;  Laterality: N/A;  . TONSILLECTOMY       OB History   No obstetric history on file.      Home Medications    Prior to Admission medications   Medication Sig Start Date End Date Taking? Authorizing Provider  acetaminophen (TYLENOL) 325 MG tablet Take 2 tablets (650 mg total) by mouth every 4 (four) hours as needed for mild pain (or temp > 37.5 C (99.5 F)). Patient taking differently: Take 650 mg by mouth 4 (four) times daily.  03/16/18   Angiulli, Lavon Paganini, PA-C  apixaban (ELIQUIS) 5 MG TABS tablet Take 1 tablet (5 mg total) by mouth 2 (two) times daily. 04/04/18   Aline August, MD  atorvastatin (LIPITOR) 10 MG tablet TAKE 1 TABLET BY MOUTH EVERY DAY AT 6 PM 09/05/18   Herminio Commons, MD  carvedilol (COREG) 6.25 MG tablet Take 1 tablet (6.25 mg total) by mouth 2 (two) times daily. 09/05/18   Herminio Commons, MD  donepezil (ARICEPT) 5 MG tablet Take 1 tablet by mouth at bedtime. 11/15/17   [provider]  furosemide (LASIX) 40 MG tablet Take twice a day EVERY OTHER DAY 06/23/18   Herminio Commons, MD  isosorbide mononitrate (IMDUR) 30 MG 24 hr tablet TAKE 1/2 TABLET(15 MG) BY MOUTH DAILY 09/05/18   Herminio Commons, MD  methylphenidate (RITALIN) 5 MG tablet Take 1 tablet (5 mg total) by mouth 2 (two) times daily with breakfast and lunch. 03/16/18   Angiulli, Lavon Paganini, PA-C  potassium chloride SA (K-DUR,KLOR-CON) 20 MEQ tablet TAKE 2 TABLETS(40 MEQ) BY MOUTH DAILY 07/04/18   Charlie Pitter, PA-C  spironolactone (ALDACTONE) 25 MG tablet Take 0.5 tablets (12.5 mg total)  by mouth daily. 04/05/18   Aline August, MD  traMADol (ULTRAM) 50 MG tablet Take 1 tablet (50 mg total) by mouth every 6 (six) hours as needed for moderate pain. 04/04/18   Aline August, MD  umeclidinium-vilanterol (ANORO ELLIPTA) 62.5-25 MCG/INH AEPB Inhale 1 puff into the lungs daily. 03/16/18   Angiulli, Lavon Paganini, PA-C    Family History Family History  Problem Relation Age of Onset  . Hypertension Brother   . Hypertension Maternal Grandmother   . Cancer Paternal Grandmother     Social History Social History   Tobacco Use  . Smoking status: Former Smoker    Packs/day: 1.00    Years: 50.00    Pack years: 50.00    Types: Cigarettes    Start date: 09/02/1960  Quit date: 03/29/2008    Years since quitting: 10.5  . Smokeless tobacco: Never Used  Substance Use Topics  . Alcohol use: No    Alcohol/week: 0.0 standard drinks  . Drug use: Never     Allergies   Lisinopril   Review of Systems Review of Systems  Constitutional: Negative for chills and fever.  Respiratory: Negative for shortness of breath.   Cardiovascular: Negative for chest pain.  Gastrointestinal: Negative for abdominal pain, diarrhea and vomiting.  Neurological: Positive for weakness. Negative for numbness.  All other systems reviewed and are negative.    Physical Exam Updated Vital Signs BP 95/73   Pulse 91   Temp 98 F (36.7 C) (Oral)   Resp 17   Wt 57.1 kg   SpO2 96%   BMI 21.61 kg/m   Physical Exam Vitals signs and nursing note reviewed.  Constitutional:      General: She is not in acute distress.    Appearance: She is well-developed. She is ill-appearing.     Comments: Responsive to voice.  Answers yes and no questions with nodding.   HENT:     Head: Normocephalic and atraumatic.     Mouth/Throat:     Mouth: Mucous membranes are moist.  Eyes:     Conjunctiva/sclera: Conjunctivae normal.     Pupils: Pupils are equal, round, and reactive to light.  Neck:     Musculoskeletal: Normal  range of motion and neck supple.  Cardiovascular:     Rate and Rhythm: Normal rate and regular rhythm.     Heart sounds: S1 normal and S2 normal. No murmur.     Comments: Heart sounds distant.  Pulmonary:     Effort: Pulmonary effort is normal.     Breath sounds: Normal breath sounds. No wheezing or rales.  Abdominal:     General: There is no distension.     Palpations: Abdomen is soft.     Tenderness: There is no abdominal tenderness. There is no guarding.  Genitourinary:    Comments: No skin breakdown of rectum.  Musculoskeletal: Normal range of motion.        General: No deformity.     Comments: Atrophy of LLE.  Lymphadenopathy:     Cervical: No cervical adenopathy.  Skin:    General: Skin is warm and dry.     Findings: No erythema or rash.  Neurological:     Mental Status: She is alert.     Comments: Left sided hemiparesis.   Psychiatric:        Behavior: Behavior normal.        Thought Content: Thought content normal.        Judgment: Judgment normal.      ED Treatments / Results  Labs (all labs ordered are listed, but only abnormal results are displayed) Labs Reviewed  COMPREHENSIVE METABOLIC PANEL - Abnormal; Notable for the following components:      Result Value   Sodium 151 (*)    Chloride 122 (*)    CO2 21 (*)    Glucose, Bld 117 (*)    BUN 94 (*)    Creatinine, Ser 2.58 (*)    GFR calc non Af Amer 17 (*)    GFR calc Af Amer 20 (*)    All other components within normal limits  CBC WITH DIFFERENTIAL/PLATELET - Abnormal; Notable for the following components:   MCHC 29.8 (*)    All other components within normal limits  LACTIC ACID, PLASMA - Abnormal;  Notable for the following components:   Lactic Acid, Venous 2.4 (*)    All other components within normal limits  CULTURE, BLOOD (ROUTINE X 2)  CULTURE, BLOOD (ROUTINE X 2)  SARS CORONAVIRUS 2 (HOSPITAL ORDER, [place] [facility])  URINALYSIS, ROUTINE W REFLEX MICROSCOPIC  LACTIC ACID,  PLASMA    EKG EKG Interpretation  Date/Time:  Tuesday October 10 2018 21:15:51 EDT Ventricular Rate:  92 PR Interval:    QRS Duration: 114 QT Interval:  366 QTC Calculation: 453 R Axis:   -57 Text Interpretation:  Sinus rhythm Left axis deviation LVH with secondary repolarization abnormality Inferior infarct, old Anterior infarct, old Artifact When compared with ECG of 03/29/2018 Normal sinus rhythm has replaced Atrial fibrillation Poor data quality suggest repeat tracing Confirmed by Francine Graven [phone]) on 10/10/2018 9:53:54 PM   Radiology Dg Chest Portable 1 View  Result Date: 10/10/2018 CLINICAL DATA:  Pt with decreased appetite and weakness. Pt had recent stroke, so best image possible due to rigidity. Hx CHF, HTNWeakness EXAM: PORTABLE CHEST 1 VIEW COMPARISON:  Radiograph 03/29/2018 FINDINGS: Normal cardiac silhouette. Low lung volumes. No effusion, infiltrate or pneumothorax. Anterior cervical fusion. IMPRESSION: No acute cardiopulmonary process. Electronically Signed   By: Suzy Bouchard M.D.   On: 10/10/2018 20:56    Procedures Procedures (including critical care time)  Medications Ordered in ED Medications  sodium chloride 0.9 % bolus 500 mL (0 mLs Intravenous Stopped 10/10/18 2151)     Initial Impression / Assessment and Plan / ED Course  I have reviewed the triage vital signs and the nursing notes.  Pertinent labs & imaging results that were available during my care of the patient were reviewed by me and considered in my medical decision making (see chart for details).  Clinical Course as of Oct 10 2231  Tue Oct 10, 2018  2231 Spoke with Dr. Olevia Bowens who will admit pt. Appreciate his involvement.    [AM]    Clinical Course User Index [AM] Albesa Seen, PA-C       This is a 19-year female past medical history of heart failure, EF of 25%, hypertension, CVA in November 2019 with residual left-sided hemiplegia and aphasia presenting for abnormal labs.   According to PCP, patient had abnormal kidney function.  Patient is on diuretic, Lasix 40 mg.  She is recently had adjustments of her Coreg with reduction in dose by half.  She has had soft blood pressure readings according to records.  Here in the emergency department, patient has acute renal failure.  See below for trend of creatinine.  BUN 94.  She is also hypernatremic at 151.  Patient with a gentle repletion of IV fluids in emergency department.  Suspect possible cardiorenal syndrome or overdiuresis with Lasix.  Patient did have a lactic acidosis of 2.4.  Rectal temp obtained, patient is afebrile.  No other signs of SIRS or sepsis.  No focal source of infection.  Unlikely to be related to sepsis.  Blood cultures were obtained.  Patient admitted to [facility].  Final Clinical Impressions(s) / ED Diagnoses   Final diagnoses:  Acute renal failure, unspecified acute renal failure type [name])  Lactic acidosis    ED Discharge Orders    None       Tamala Julian 10/10/18 2233    Francine Graven, DO 10/13/18 1539

## 2018-10-10 NOTE — ED Triage Notes (Signed)
Pt brought in by family per PCP request; spouse states pt was seen at her family doctor yesterday and had lab work drawn. The PCP called today and told family pt is dehydrated and needs to come to hospital to get fluids; pt has had a stroke and is unable to give any hx

## 2018-10-11 DIAGNOSIS — Z515 Encounter for palliative care: Secondary | ICD-10-CM | POA: Diagnosis not present

## 2018-10-11 DIAGNOSIS — I5042 Chronic combined systolic (congestive) and diastolic (congestive) heart failure: Secondary | ICD-10-CM

## 2018-10-11 DIAGNOSIS — Z993 Dependence on wheelchair: Secondary | ICD-10-CM | POA: Diagnosis not present

## 2018-10-11 DIAGNOSIS — I69354 Hemiplegia and hemiparesis following cerebral infarction affecting left non-dominant side: Secondary | ICD-10-CM | POA: Diagnosis not present

## 2018-10-11 DIAGNOSIS — Z7189 Other specified counseling: Secondary | ICD-10-CM

## 2018-10-11 DIAGNOSIS — Z6821 Body mass index (BMI) 21.0-21.9, adult: Secondary | ICD-10-CM | POA: Diagnosis not present

## 2018-10-11 DIAGNOSIS — M8580 Other specified disorders of bone density and structure, unspecified site: Secondary | ICD-10-CM | POA: Diagnosis present

## 2018-10-11 DIAGNOSIS — E872 Acidosis: Secondary | ICD-10-CM | POA: Diagnosis present

## 2018-10-11 DIAGNOSIS — N179 Acute kidney failure, unspecified: Secondary | ICD-10-CM

## 2018-10-11 DIAGNOSIS — E870 Hyperosmolality and hypernatremia: Secondary | ICD-10-CM | POA: Diagnosis present

## 2018-10-11 DIAGNOSIS — I420 Dilated cardiomyopathy: Secondary | ICD-10-CM | POA: Diagnosis present

## 2018-10-11 DIAGNOSIS — M79671 Pain in right foot: Secondary | ICD-10-CM | POA: Diagnosis not present

## 2018-10-11 DIAGNOSIS — N183 Chronic kidney disease, stage 3 (moderate): Secondary | ICD-10-CM | POA: Diagnosis present

## 2018-10-11 DIAGNOSIS — K5730 Diverticulosis of large intestine without perforation or abscess without bleeding: Secondary | ICD-10-CM | POA: Diagnosis present

## 2018-10-11 DIAGNOSIS — E43 Unspecified severe protein-calorie malnutrition: Secondary | ICD-10-CM | POA: Diagnosis present

## 2018-10-11 DIAGNOSIS — E785 Hyperlipidemia, unspecified: Secondary | ICD-10-CM | POA: Diagnosis present

## 2018-10-11 DIAGNOSIS — R627 Adult failure to thrive: Secondary | ICD-10-CM

## 2018-10-11 DIAGNOSIS — E860 Dehydration: Secondary | ICD-10-CM | POA: Diagnosis present

## 2018-10-11 DIAGNOSIS — Z1159 Encounter for screening for other viral diseases: Secondary | ICD-10-CM | POA: Diagnosis not present

## 2018-10-11 DIAGNOSIS — Z981 Arthrodesis status: Secondary | ICD-10-CM | POA: Diagnosis not present

## 2018-10-11 DIAGNOSIS — E876 Hypokalemia: Secondary | ICD-10-CM | POA: Diagnosis present

## 2018-10-11 DIAGNOSIS — J449 Chronic obstructive pulmonary disease, unspecified: Secondary | ICD-10-CM | POA: Diagnosis present

## 2018-10-11 DIAGNOSIS — I130 Hypertensive heart and chronic kidney disease with heart failure and stage 1 through stage 4 chronic kidney disease, or unspecified chronic kidney disease: Secondary | ICD-10-CM | POA: Diagnosis present

## 2018-10-11 DIAGNOSIS — E869 Volume depletion, unspecified: Secondary | ICD-10-CM | POA: Diagnosis present

## 2018-10-11 DIAGNOSIS — I480 Paroxysmal atrial fibrillation: Secondary | ICD-10-CM | POA: Diagnosis present

## 2018-10-11 DIAGNOSIS — Z7401 Bed confinement status: Secondary | ICD-10-CM | POA: Diagnosis not present

## 2018-10-11 LAB — BASIC METABOLIC PANEL
Anion gap: 5 (ref 5–15)
BUN: 91 mg/dL — ABNORMAL HIGH (ref 8–23)
CO2: 21 mmol/L — ABNORMAL LOW (ref 22–32)
Calcium: 9.3 mg/dL (ref 8.9–10.3)
Chloride: 122 mmol/L — ABNORMAL HIGH (ref 98–111)
Creatinine, Ser: 2.32 mg/dL — ABNORMAL HIGH (ref 0.44–1.00)
GFR calc Af Amer: 23 mL/min — ABNORMAL LOW (ref >60)
GFR calc non Af Amer: 20 mL/min — ABNORMAL LOW (ref >60)
Glucose, Bld: 136 mg/dL — ABNORMAL HIGH (ref 70–99)
Potassium: 4.4 mmol/L (ref 3.5–5.1)
Sodium: 148 mmol/L — ABNORMAL HIGH (ref 135–145)

## 2018-10-11 MED ORDER — MEGESTROL ACETATE 40 MG PO TABS
100.0000 mg | ORAL_TABLET | Freq: Two times a day (BID) | ORAL | Status: DC
  Filled 2018-10-11: qty 1

## 2018-10-11 MED ORDER — APIXABAN 5 MG PO TABS: 5.0000 mg | ORAL_TABLET | Freq: Two times a day (BID) | ORAL | Status: DC

## 2018-10-11 MED ORDER — ISOSORBIDE MONONITRATE ER 30 MG PO TB24
15.0000 mg | ORAL_TABLET | Freq: Every morning | ORAL | Status: DC
  Administered 2018-10-11 – 2018-10-15 (×4): 15 mg via ORAL
  Filled 2018-10-11 (×5): qty 1

## 2018-10-11 MED ORDER — ATORVASTATIN CALCIUM 10 MG PO TABS
5.0000 mg | ORAL_TABLET | Freq: Every morning | ORAL | Status: DC
  Administered 2018-10-11 – 2018-10-15 (×5): 5 mg via ORAL
  Filled 2018-10-11 (×5): qty 1

## 2018-10-11 MED ORDER — MEGESTROL ACETATE 40 MG PO TABS
100.0000 mg | ORAL_TABLET | Freq: Two times a day (BID) | ORAL | Status: DC
  Administered 2018-10-11 – 2018-10-15 (×6): 100 mg via ORAL
  Filled 2018-10-11 (×11): qty 2.5

## 2018-10-11 MED ORDER — UMECLIDINIUM-VILANTEROL 62.5-25 MCG/INH IN AEPB
1.0000 | INHALATION_SPRAY | Freq: Every day | RESPIRATORY_TRACT | Status: DC
  Administered 2018-10-11 – 2018-10-15 (×5): 1 via RESPIRATORY_TRACT
  Filled 2018-10-11: qty 14

## 2018-10-11 MED ORDER — METHYLPHENIDATE HCL 5 MG PO TABS
5.0000 mg | ORAL_TABLET | Freq: Two times a day (BID) | ORAL | Status: DC
  Administered 2018-10-11 – 2018-10-15 (×8): 5 mg via ORAL
  Filled 2018-10-11 (×9): qty 1

## 2018-10-11 MED ORDER — APIXABAN 2.5 MG PO TABS
2.5000 mg | ORAL_TABLET | Freq: Two times a day (BID) | ORAL | Status: DC
  Filled 2018-10-11: qty 1

## 2018-10-11 MED ORDER — DONEPEZIL HCL 5 MG PO TABS
5.0000 mg | ORAL_TABLET | Freq: Every day | ORAL | Status: DC
  Administered 2018-10-11 – 2018-10-14 (×5): 5 mg via ORAL
  Filled 2018-10-11 (×5): qty 1

## 2018-10-11 MED ORDER — CARVEDILOL 3.125 MG PO TABS
3.1250 mg | ORAL_TABLET | Freq: Two times a day (BID) | ORAL | Status: DC
  Administered 2018-10-11 – 2018-10-15 (×9): 3.125 mg via ORAL
  Filled 2018-10-11 (×10): qty 1

## 2018-10-11 MED ORDER — CITALOPRAM HYDROBROMIDE 20 MG PO TABS
10.0000 mg | ORAL_TABLET | Freq: Every day | ORAL | Status: DC
  Administered 2018-10-11 – 2018-10-15 (×4): 10 mg via ORAL
  Filled 2018-10-11 (×5): qty 1

## 2018-10-11 NOTE — Consult Note (Signed)
Consultation Note Date: 10/11/2018   Patient Name: Brandy [name]  DOB: [birth year]  MRN: [id number]  Age / Sex: 78 y.o., female  PCP: [name], MD Referring Physician: Orson Eva, MD  Reason for Consultation: Establishing goals of care  HPI/Patient Profile: 78 y.o. female  with past medical history of CVA April 2019 and November 2019 with resulting left hemiplegia, afib on Eliquis, combined systolic and diastolic CHF, cardiomyopathy with EF 25%,  HTN, osteopenia, colon polyps, past smoker admitted on 10/10/2018 with decrease in appetite and dehydration. PCP recommended ER visit with labs revealing electrolyte imbalance and increase in renal function. In ED, NA 151 and BUN/creat 94/2.58. Chest xray negative for acute findings. Palliative medicine consultation for goals of care.   Clinical Assessment and Goals of Care:  I have reviewed medical records, discussed with Dr. Carles Collet, and assessed the patient at bedside. [name] will wake to voice but drowsy. She will nod head yes/no to simple questions. She denies pain or other symptoms. She does not appear to be in pain or discomfort.   Spoke with husband, Brandy [name] via telephone. Shortly after, also spoke with patient's brother, Brandy [name] who lives in [place].   Introduced Palliative Medicine as specialized medical care for people living with serious illness. It focuses on providing relief from the symptoms and stress of a serious illness. The goal is to improve quality of life for both the patient and the family.  We discussed a brief life review of the patient. Husband shares prior to his wife's debilitating stroke in November, she was independent and still working for [employer]. 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 She has been married to [place] for 41  years. They have one daughter, Brandy [name].   Discussed events since stroke in November 2019. She was at [facility] for rehab and eventually discharged home in March 2020. Great support from family and also has 12-14 hours of caregiver support (nurse/aid) every day. She was "happy to get home." Suffers from left hemiplegia and baseline wheelchair/bedbound. She has difficulty speaking but often communicates to yes/no questions.   Husband shares that they are building an extension onto their house and daughter plans to move in with them in the next 2 months for additional support.   Discussed events leading up to admission and course of hospitalization including diagnoses and interventions. Explored previous discussions regarding feeding tube placement. Husband does not know much about feeding tube placement and was just told she "may need a feeding tube."   I attempted to elicit values and goals of care important to the patient and family. Advanced directives, concepts specific to code status, artifical feeding and hydration were discussed. Husband shares that she does NOT have an advance directive or living will. She has never talked about her thoughts/wishes in regards to heroic medical interventions including resuscitation, life support, or feeding tubes. Explained medical recommendation and consideration of limitations  to care with underlying chronic conditions and poor chances of meaningful recovery if resuscitation was necessary. Also the aggressive nature of resuscitative efforts.   Husband asks "how long" would she need the feeding tube. Explained that often time individuals with chronic, progressive conditions and/or with underlying debility following a stroke will reach a point where the body fails to thrive with progressive functional/nutritional status decline. Explained that the feeding tube may be for the rest of her life, unless nutritionally, she starts eating/drinking more and able to sustain  her nutritional status. Explained risks with PEG tube including infection/aspiration.    Husband plans to further discuss with his daughter, Brandy [name]. I also encouraged involving [name] in discussions, since she is of sound mind.   We did discuss plan for SLP evaluation. Husband reports she has experienced difficulty swallowing and intermittent coughing while swallowing.   Answered questions and concerns for husband. PMT contact information given.     **Also spoke with patient's brother, Brandy [name] via telephone. Updated him on plan of care and time spent discussing labs and recommendations from outpatient neurology/cardiology notes. Dr. Tamala Julian has many requests including PT/INR and repeat ECHO. Deferred to attending. Dr. Tamala Julian speaks of plan to have PEG tube placed prior to discharge. He speaks highly of [name] as a "wonderful sister" and the great support she receives from her husband and daughter. Answered questions and PMT contact information given.    SUMMARY OF RECOMMENDATIONS    FULL code/FULL scope treatment. Husband reports patient does not have an advance directive and has not previously spoke of her wishes in regards to heroic medical interventions. Encouraged ongoing patient/family discussion and consideration of limitations to care with underlying chronic, irreversible conditions.   Family leaning towards PEG tube placement. GI following.  SLP evaluation. Family reports difficulty swallowing/intermittent coughing while swallowing.   Most importantly, family wishes to keep her home. Great support including daily 12+ hours of nurse/aid caregiver support. Also, daughter planning to move in with them soon for additional support.   May benefit from outpatient palliative referral but unfortunately limited in [facility].   Code Status/Advance Care Planning:  Full code  Symptom Management:   Per attending  Palliative Prophylaxis:   Aspiration, Delirium Protocol, Oral Care  and Turn Reposition  Psycho-social/Spiritual:   Desire for further Chaplaincy support:yes  Additional Recommendations: Caregiving  Support/Resources  Prognosis:   Unable to determine  Discharge Planning: To Be Determined      Primary Diagnoses: Present on Admission: . Acute prerenal azotemia . Hypernatremia . Hypertension . AKI (acute kidney injury) ([place]) . Chronic combined systolic and diastolic congestive heart failure ([place]) . Paroxysmal atrial fibrillation ([facility])   I have reviewed the medical record, interviewed the patient and family, and examined the patient. The following aspects are pertinent.  Past Medical History:  Diagnosis Date  . Cardiomyopathy    a. EF of 25% in 07/2006 b. EF normalized by repeat echo in 2011 c. EF 35-40% by echo in 05/2017 with cath showing mild nonobstructive CAD  . Chronic combined systolic and diastolic CHF (congestive heart failure) ([place])   . Colon polyps   . Diverticulosis of colon   . Hypertension   . Hypertensive heart disease   . Osteopenia   . Paroxysmal atrial fibrillation ([place]) 10/10/2018  . Stroke ([place])   . TIA (transient ischemic attack)   . Tobacco abuse    50 pack year   Social History   Socioeconomic History  . Marital status: Married  Spouse name: Not on file  . Number of children: Not on file  . Years of education: Not on file  . Highest education level: Not on file  Occupational History  . Occupation: Former Product manager: RETIRED  Social Needs  . Financial resource strain: Not on file  . Food insecurity    Worry: Not on file    Inability: Not on file  . Transportation needs    Medical: Not on file    Non-medical: Not on file  Tobacco Use  . Smoking status: Former Smoker    Packs/day: 1.00    Years: 50.00    Pack years: 50.00    Types: Cigarettes    Start date: 09/02/1960    Quit date: 03/29/2008    Years since quitting: 10.5  . Smokeless tobacco: Never Used  Substance and Sexual Activity  .  Alcohol use: No    Alcohol/week: 0.0 standard drinks  . Drug use: Never  . Sexual activity: Not Currently  Lifestyle  . Physical activity    Days per week: Not on file    Minutes per session: Not on file  . Stress: Not on file  Relationships  . Social Herbalist on phone: Not on file    Gets together: Not on file    Attends religious service: Not on file    Active member of club or organization: Not on file    Attends meetings of clubs or organizations: Not on file    Relationship status: Not on file  Other Topics Concern  . Not on file  Social History Narrative   Married with 2 adult children, resides with spouse    Family History  Problem Relation Age of Onset  . Hypertension Brother   . Hypertension Maternal Grandmother   . Cancer Paternal Grandmother    Scheduled Meds: . atorvastatin  5 mg Oral q morning - 10a  . carvedilol  3.125 mg Oral BID  . citalopram  10 mg Oral Daily  . donepezil  5 mg Oral QHS  . isosorbide mononitrate  15 mg Oral q morning - 10a  . megestrol  100 mg Oral BID  . methylphenidate  5 mg Oral BID WC  . umeclidinium-vilanterol  1 puff Inhalation Daily   Continuous Infusions: . dextrose 100 mL/hr at 10/11/18 1043   PRN Meds:. Medications Prior to Admission:  Prior to Admission medications   Medication Sig Start Date End Date Taking? Authorizing Provider  acetaminophen (TYLENOL) 325 MG tablet Take 2 tablets (650 mg total) by mouth every 4 (four) hours as needed for mild pain (or temp > 37.5 C (99.5 F)). Patient taking differently: Take 650 mg by mouth every 6 (six) hours as needed for mild pain or moderate pain.  03/16/18  Yes Angiulli, Lavon Paganini, PA-C  apixaban (ELIQUIS) 5 MG TABS tablet Take 1 tablet (5 mg total) by mouth 2 (two) times daily. 04/04/18  Yes Aline August, MD  atorvastatin (LIPITOR) 10 MG tablet TAKE 1 TABLET BY MOUTH EVERY DAY AT 6 PM Patient taking differently: Take 5 mg by mouth every morning.  09/05/18  Yes Herminio Commons, MD  carvedilol (COREG) 6.25 MG tablet Take 1 tablet (6.25 mg total) by mouth 2 (two) times daily. Patient taking differently: Take 3.125 mg by mouth 2 (two) times daily.  09/05/18  Yes Herminio Commons, MD  citalopram (CELEXA) 10 MG tablet Take 10 mg by mouth daily.   Yes [provider]  donepezil (ARICEPT) 5 MG tablet Take 1 tablet by mouth at bedtime. 11/15/17  Yes [provider]  furosemide (LASIX) 40 MG tablet Take twice a day EVERY OTHER DAY 06/23/18  Yes Herminio Commons, MD  isosorbide mononitrate (IMDUR) 30 MG 24 hr tablet TAKE 1/2 TABLET(15 MG) BY MOUTH DAILY Patient taking differently: Take 15 mg by mouth every morning.  09/05/18  Yes Herminio Commons, MD  lactobacillus acidophilus (BACID) TABS tablet Take 1 tablet by mouth daily.   Yes [provider]  megestrol (MEGACE) 20 MG tablet Take 100 mg by mouth 2 (two) times a day.   Yes [provider]  methylphenidate (RITALIN) 5 MG tablet Take 1 tablet (5 mg total) by mouth 2 (two) times daily with breakfast and lunch. 03/16/18  Yes Angiulli, Lavon Paganini, PA-C  mirtazapine (REMERON) 15 MG tablet Take 7.5 mg by mouth daily. 09/18/18  Yes [provider]  potassium chloride SA (K-DUR,KLOR-CON) 20 MEQ tablet TAKE 2 TABLETS(40 MEQ) BY MOUTH DAILY Patient taking differently: Take 40 mEq by mouth daily.  07/04/18  Yes Dunn, Nedra Hai, PA-C  spironolactone (ALDACTONE) 25 MG tablet Take 0.5 tablets (12.5 mg total) by mouth daily. 04/05/18  Yes Aline August, MD  umeclidinium-vilanterol (ANORO ELLIPTA) 62.5-25 MCG/INH AEPB Inhale 1 puff into the lungs daily. 03/16/18  Yes Angiulli, Lavon Paganini, PA-C  traMADol (ULTRAM) 50 MG tablet Take 1 tablet (50 mg total) by mouth every 6 (six) hours as needed for moderate pain. Patient not taking: Reported on 10/11/2018 04/04/18   Aline August, MD   Allergies  Allergen Reactions  . Lisinopril Swelling   Review of Systems  Unable to perform ROS: Acuity of  condition    Physical Exam Vitals signs and nursing note reviewed.  Constitutional:      Appearance: She is ill-appearing.  HENT:     Head: Normocephalic and atraumatic.  Cardiovascular:     Rate and Rhythm: Normal rate.  Pulmonary:     Effort: No tachypnea, accessory muscle usage or respiratory distress.  Skin:    General: Skin is warm and dry.  Neurological:     Mental Status: She is easily aroused.     Comments: Dysarthria. Nods yes/no to simple questions.  Psychiatric:        Attention and Perception: She is inattentive.        Speech: Speech is delayed.    Vital Signs: BP (!) 92/55 (BP Location: Left Arm)   Pulse 63   Temp 98.7 F (37.1 C) (Oral)   Resp 15   Ht 5\' 5"  (1.651 m)   Wt 54 kg   SpO2 100%   BMI 19.81 kg/m  Pain Scale: PAINAD      SpO2: SpO2: 100 %  O2 Device:SpO2: 100 % O2 Flow Rate: .O2 Flow Rate (L/min): 2 L/min  IO: Intake/output summary:   Intake/Output Summary (Last 24 hours) at 10/11/2018 1648 Last data filed at 10/11/2018 0453 Gross per 24 hour  Intake 971.21 ml  Output 0 ml  Net 971.21 ml    LBM: Last BM Date: 10/10/18(incontinent) Baseline Weight: Weight: 57.1 kg Most recent weight: Weight: 54 kg     Palliative Assessment/Data: PPS 40%   Flowsheet Rows     Most Recent Value  Intake Tab  Referral Department  Hospitalist  Unit at Time of Referral  Med/Surg Unit  Palliative Care Primary Diagnosis  Other (Comment)  Palliative Care Type  New Palliative care  Reason for referral  Clarify Goals of Care  Date first seen by Palliative Care  10/11/18  Clinical Assessment  Palliative Performance Scale Score  40%  Psychosocial & Spiritual Assessment  Palliative Care Outcomes  Patient/Family meeting held?  Yes  Who was at the meeting?  husband, brother  Palliative Care Outcomes  Clarified goals of care, Provided psychosocial or spiritual support, ACP counseling assistance, Linked to palliative care logitudinal support      Time  In/Out: 1320-1420, 2903-7955 Time Total: 71min Greater than 50%  of this time was spent counseling and coordinating care related to the above assessment and plan.  Signed by:  Ihor Dow, FNP-C Palliative Medicine Team  Phone: [phone] Fax: [phone]   Please contact Palliative Medicine Team phone at [phone] for questions and concerns.  For individual provider: See Shea Evans

## 2018-10-11 NOTE — Progress Notes (Signed)
PROGRESS NOTE  Brandy [name] SWF:[id number] DOB: December 28, 1940 DOA: 10/10/2018 PCP: [name], MD  Brief History:  78 year old female with history of hypertension and hypertensive heart disease, systolic and diastolic CHF with  EF 22%, hypertension, paroxysmal atrial fibrillation, stroke with left hemiparesis, COPD, and hyperlipidemia presenting with "dehydration".  Patient was brought into the hospital per his PCP request secondary to symptoms of failure to thrive with poor oral intake and lab suggesting dehydration. The patient has had decreased oral intake for approximately 1 month.  At baseline, the patient is nonambulatory and nonverbal but is alert and responsive by nodding.  There is not been any history of vomiting, diarrhea, fevers, chills, headache, neck pain.  Upon presentation, the patient was afebrile hemodynamically stable albeit with soft blood pressures in the 90s.  Sodium was 151 with serum creatinine 2.58.  WBC was 9.9.  Urinalysis did not show any pyuria.  Patient was started on IV fluids.  Palliative medicine was consulted to assist with management.  Patient's apixaban was held in anticipation for possible gastrostomy tube placement on 10/13/2018.  Assessment/Plan: Acute on chronic renal failure--CKD 3 -Baseline creatinine 0.9-1.1 -Presented with serum creatinine 2.58 -Continue IV fluids -Secondary to dehydration volume depletion  Failure to thrive -Serum G25 -Folic acid -PT evaluation -TSH -Continue Megace -discussed with spouse and brother who wish to pursue G-tube -pt has no desire to eat or drink  Hypernatremia -Secondary to volume depletion -Judicious hypotonic fluids  Chronic combined systolic and diastolic CHF -06/28/7060 echo EF 20-25%, grade 2 DD, diffuse HK, moderate MR, PASP 49 -Clinically volume depleted presently -Daily weights -Continue carvedilol  Stroke with hemiparesis -PT evaluation -Continue apixaban and Lipitor  Paroxysmal  atrial fibrillation -Currently in sinus rhythm -Holding apixaban in preparation for possible gastrostomy tube placement  Hyperlipidemia -continue statin  Goals of Care -palliative consult -discussed with brother and spouse--FULL CODE -they wish to pursue G-tube-->consult GI     Disposition Plan:   Home in 2-3 days  Family Communication:   Spouse and brother updated 7/15  Consultants:  GI, palliative  Code Status:  FULL  DVT Prophylaxis:  SCDs   Procedures: As Listed in Progress Note Above  Antibiotics: None   Patient denies fevers, chills, headache, chest pain, dyspnea, nausea, vomiting, diarrhea, abdominal pain, dysuria, hematuria, hematochezia, and melena.   Subjective: Patient denies fevers, chills, headache, chest pain, dyspnea, nausea, vomiting, diarrhea, abdominal pain, dysuria   Objective: Vitals:   10/11/18 0508 10/11/18 0847 10/11/18 1515 10/11/18 1518  BP: 96/64  (!) 159/132 (!) 92/55  Pulse: 84  74 63  Resp: 16  15 15   Temp: 98.5 F (36.9 C)  98.7 F (37.1 C) 98.7 F (37.1 C)  TempSrc: Oral  Oral Oral  SpO2: 100% 100% 100% 100%  Weight:      Height:        Intake/Output Summary (Last 24 hours) at 10/11/2018 1551 Last data filed at 10/11/2018 0453 Gross per 24 hour  Intake 971.21 ml  Output 0 ml  Net 971.21 ml   Weight change:  Exam:   General:  Pt is alert, follows commands appropriately, not in acute distress  HEENT: No icterus, No thrush, No neck mass, [place]/AT  Cardiovascular: RRR, S1/S2, no rubs, no gallops  Respiratory: CTA bilaterally, no wheezing, no crackles, no rhonchi  Abdomen: Soft/+BS, non tender, non distended, no guarding  Extremities: No edema, No lymphangitis, No petechiae, No rashes, no synovitis   Data  Reviewed: I have personally reviewed following labs and imaging studies Basic Metabolic Panel: Recent Labs  Lab 10/10/18 2041 10/11/18 0515  NA 151* 148*  K 4.9 4.4  CL 122* 122*  CO2 21* 21*  GLUCOSE 117*  136*  BUN 94* 91*  CREATININE 2.58* 2.32*  CALCIUM 10.0 9.3   Liver Function Tests: Recent Labs  Lab 10/10/18 2041  AST 30  ALT 16  ALKPHOS 54  BILITOT 0.5  PROT 7.1  ALBUMIN 3.8   No results for input(s): LIPASE, AMYLASE in the last 168 hours. No results for input(s): AMMONIA in the last 168 hours. Coagulation Profile: No results for input(s): INR, PROTIME in the last 168 hours. CBC: Recent Labs  Lab 10/10/18 2041  WBC 9.9  NEUTROABS 6.5  HGB 13.3  HCT 44.6  MCV 95.3  PLT 247   Cardiac Enzymes: No results for input(s): CKTOTAL, CKMB, CKMBINDEX, TROPONINI in the last 168 hours. BNP: Invalid input(s): POCBNP CBG: No results for input(s): GLUCAP in the last 168 hours. HbA1C: No results for input(s): HGBA1C in the last 72 hours. Urine analysis:    Component Value Date/Time   COLORURINE YELLOW 10/10/2018 1951   APPEARANCEUR CLEAR 10/10/2018 1951   LABSPEC 1.014 10/10/2018 1951   PHURINE 5.0 10/10/2018 1951   GLUCOSEU NEGATIVE 10/10/2018 1951   HGBUR NEGATIVE 10/10/2018 1951   BILIRUBINUR NEGATIVE 10/10/2018 [place] NEGATIVE 10/10/2018 1951   PROTEINUR NEGATIVE 10/10/2018 1951   NITRITE NEGATIVE 10/10/2018 1951   LEUKOCYTESUR NEGATIVE 10/10/2018 1951   Sepsis Labs: @LABRCNTIP (procalcitonin:4,lacticidven:4) ) Recent Results (from the past 240 hour(s))  Culture, blood (routine x 2)     Status: None (Preliminary result)   Collection Time: 10/10/18  8:41 PM   Specimen: BLOOD LEFT FOREARM  Result Value Ref Range Status   Specimen Description BLOOD LEFT FOREARM  Final   Special Requests   Final    BOTTLES DRAWN AEROBIC AND ANAEROBIC Blood Culture adequate volume   Culture   Final    NO GROWTH < 12 HOURS Performed at [facility], [address] [place], [place] [address]    Report Status PENDING  Incomplete  Culture, blood (routine x 2)     Status: None (Preliminary result)   Collection Time: 10/10/18  8:42 PM   Specimen: BLOOD RIGHT FOREARM   Result Value Ref Range Status   Specimen Description BLOOD RIGHT FOREARM  Final   Special Requests   Final    BOTTLES DRAWN AEROBIC AND ANAEROBIC Blood Culture adequate volume   Culture   Final    NO GROWTH < 12 HOURS Performed at [facility], [address] [place], [place] [address]    Report Status PENDING  Incomplete  SARS Coronavirus 2 (CEPHEID - Performed in [place] hospital lab), Hosp Order     Status: None   Collection Time: 10/10/18  9:20 PM   Specimen: Nasopharyngeal Swab  Result Value Ref Range Status   SARS Coronavirus 2 NEGATIVE NEGATIVE Final    Comment: (NOTE) If result is NEGATIVE SARS-CoV-2 target nucleic acids are NOT DETECTED. The SARS-CoV-2 RNA is generally detectable in upper and lower  respiratory specimens during the acute phase of infection. The lowest  concentration of SARS-CoV-2 viral copies this assay can detect is 250  copies / mL. A negative result does not preclude SARS-CoV-2 infection  and should not be used as the sole basis for treatment or other  patient management decisions.  A negative result may occur with  improper  specimen collection / handling, submission of specimen other  than nasopharyngeal swab, presence of viral mutation(s) within the  areas targeted by this assay, and inadequate number of viral copies  (<250 copies / mL). A negative result must be combined with clinical  observations, patient history, and epidemiological information. If result is POSITIVE SARS-CoV-2 target nucleic acids are DETECTED. The SARS-CoV-2 RNA is generally detectable in upper and lower  respiratory specimens dur ing the acute phase of infection.  Positive  results are indicative of active infection with SARS-CoV-2.  Clinical  correlation with patient history and other diagnostic information is  necessary to determine patient infection status.  Positive results do  not rule out bacterial infection or co-infection with other viruses. If result is  PRESUMPTIVE POSTIVE SARS-CoV-2 nucleic acids MAY BE PRESENT.   A presumptive positive result was obtained on the submitted specimen  and confirmed on repeat testing.  While 2019 novel coronavirus  (SARS-CoV-2) nucleic acids may be present in the submitted sample  additional confirmatory testing may be necessary for epidemiological  and / or clinical management purposes  to differentiate between  SARS-CoV-2 and other Sarbecovirus currently known to infect humans.  If clinically indicated additional testing with an alternate test  methodology [phone]) is advised. The SARS-CoV-2 RNA is generally  detectable in upper and lower respiratory sp ecimens during the acute  phase of infection. The expected result is Negative. Fact Sheet for Patients:  StrictlyIdeas.no Fact Sheet for Healthcare Providers: BankingDealers.co.za This test is not yet approved or cleared by the [place] FDA and has been authorized for detection and/or diagnosis of SARS-CoV-2 by FDA under an Emergency Use Authorization (EUA).  This EUA will remain in effect (meaning this test can be used) for the duration of the COVID-19 declaration under Section 564(b)(1) of the Act, 21 U.S.C. section 360bbb-3(b)(1), unless the authorization is terminated or revoked sooner. Performed at [facility], [address] [place], [place] [address]      Scheduled Meds: . atorvastatin  5 mg Oral q morning - 10a  . carvedilol  3.125 mg Oral BID  . citalopram  10 mg Oral Daily  . donepezil  5 mg Oral QHS  . isosorbide mononitrate  15 mg Oral q morning - 10a  . megestrol  100 mg Oral BID  . methylphenidate  5 mg Oral BID WC  . umeclidinium-vilanterol  1 puff Inhalation Daily   Continuous Infusions: . dextrose 100 mL/hr at 10/11/18 1043    Procedures/Studies: Dg Chest Portable 1 View  Result Date: 10/10/2018 CLINICAL DATA:  Pt with decreased appetite and weakness. Pt had recent  stroke, so best image possible due to rigidity. Hx CHF, HTNWeakness EXAM: PORTABLE CHEST 1 VIEW COMPARISON:  Radiograph 03/29/2018 FINDINGS: Normal cardiac silhouette. Low lung volumes. No effusion, infiltrate or pneumothorax. Anterior cervical fusion. IMPRESSION: No acute cardiopulmonary process. Electronically Signed   By: Suzy Bouchard M.D.   On: 10/10/2018 20:56    Orson Eva, DO  [facility] Pager [phone]  If 7PM-7AM, please contact night-coverage [email or web address] Password [email or web address] 10/11/2018, 3:51 PM   LOS: 0 days

## 2018-10-11 NOTE — Plan of Care (Addendum)
DISCUSSED WITH BROTHER, DR. Edd Arbour Micalizzi, THE NEED FOR PEG. WILL DISCUSS WITH PT. HOLD ELIQUIS DUE TO NEED FOR POSSIBLE NEED FOR PEG PLACEMENT JUL 17.  1600: SPOKE WITH PT. WANTS TO HAVE PEG PLACED FOR FEEDING. ATTEMPTED TO CONTACT HUSBAND([phone])-NO ANSWER/NO VM. DISCUSSED WITH DAUGHTER: KRISTEN. ANSWERED QUESTIONS.

## 2018-10-12 DIAGNOSIS — I480 Paroxysmal atrial fibrillation: Secondary | ICD-10-CM

## 2018-10-12 DIAGNOSIS — E43 Unspecified severe protein-calorie malnutrition: Secondary | ICD-10-CM | POA: Insufficient documentation

## 2018-10-12 DIAGNOSIS — N183 Chronic kidney disease, stage 3 (moderate): Secondary | ICD-10-CM

## 2018-10-12 DIAGNOSIS — N179 Acute kidney failure, unspecified: Principal | ICD-10-CM

## 2018-10-12 LAB — T4, FREE: Free T4: 1.17 ng/dL — ABNORMAL HIGH (ref 0.61–1.12)

## 2018-10-12 LAB — BASIC METABOLIC PANEL
Anion gap: 8 (ref 5–15)
BUN: 73 mg/dL — ABNORMAL HIGH (ref 8–23)
CO2: 20 mmol/L — ABNORMAL LOW (ref 22–32)
Calcium: 9.1 mg/dL (ref 8.9–10.3)
Chloride: 110 mmol/L (ref 98–111)
Creatinine, Ser: 1.86 mg/dL — ABNORMAL HIGH (ref 0.44–1.00)
GFR calc Af Amer: 30 mL/min — ABNORMAL LOW (ref >60)
GFR calc non Af Amer: 26 mL/min — ABNORMAL LOW (ref >60)
Glucose, Bld: 117 mg/dL — ABNORMAL HIGH (ref 70–99)
Potassium: 4 mmol/L (ref 3.5–5.1)
Sodium: 138 mmol/L (ref 135–145)

## 2018-10-12 LAB — TSH: TSH: 1.143 u[IU]/mL (ref 0.350–4.500)

## 2018-10-12 LAB — FOLATE: Folate: 9.1 ng/mL (ref >5.9)

## 2018-10-12 LAB — MAGNESIUM: Magnesium: 2.1 mg/dL (ref 1.7–2.4)

## 2018-10-12 LAB — VITAMIN B12: Vitamin B-12: 511 pg/mL (ref 180–914)

## 2018-10-12 MED ORDER — OSMOLITE 1.5 CAL PO LIQD
120.0000 mL | Freq: Every day | ORAL | Status: DC
  Administered 2018-10-13 – 2018-10-15 (×11): 120 mL
  Filled 2018-10-12 (×16): qty 1000

## 2018-10-12 MED ORDER — DEXTROSE-NACL 5-0.45 % IV SOLN
INTRAVENOUS | Status: DC
  Administered 2018-10-12: 20:00:00 via INTRAVENOUS

## 2018-10-12 MED ORDER — ADULT MULTIVITAMIN LIQUID CH
15.0000 mL | Freq: Every day | ORAL | Status: DC
  Administered 2018-10-12 – 2018-10-15 (×3): 15 mL via ORAL
  Filled 2018-10-12 (×4): qty 15

## 2018-10-12 MED ORDER — ALUM & MAG HYDROXIDE-SIMETH 200-200-20 MG/5ML PO SUSP
30.0000 mL | ORAL | Status: DC | PRN
  Administered 2018-10-12: 22:00:00 30 mL via ORAL
  Filled 2018-10-12: qty 30

## 2018-10-12 NOTE — Progress Notes (Signed)
Daily Progress Note   Patient Name: Brandy [name]       Date: 10/12/2018 DOB: 1941/02/03  Age: 78 y.o. MRN#: [id number] Attending Physician: Orson Eva, MD Primary Care Physician: [name], MD Admit Date: 10/10/2018  Reason for Consultation/Follow-up: Establishing goals of care  Subjective/GOC: Patient awake and alert this afternoon. Ate some of lunch. Worked with SLP and PT. Nods head yes/no to questions with baseline dysarthria. Denies pain or discomfort. Agrees to having PEG tube placement. Reviewed SLP recommendations with Brandy [name] including finely chopped/mechanical diet for easier chewing and swallowing.   Spoke with husband, [name]. He spoke with Dr. Arnoldo Morale this morning and has consented to PEG tube placement, likely tomorrow. Also discussed SLP recommendations with husband including moderate risk for aspiration. Reviewed PT recommendations. Husband denies questions or concerns about plan.   Length of Stay: 1  Current Medications: Scheduled Meds:  . atorvastatin  5 mg Oral q morning - 10a  . carvedilol  3.125 mg Oral BID  . citalopram  10 mg Oral Daily  . donepezil  5 mg Oral QHS  . isosorbide mononitrate  15 mg Oral q morning - 10a  . megestrol  100 mg Oral BID  . methylphenidate  5 mg Oral BID WC  . umeclidinium-vilanterol  1 puff Inhalation Daily    Continuous Infusions: . dextrose 100 mL/hr at 10/12/18 1212    PRN Meds:   Physical Exam Vitals signs and nursing note reviewed.  Constitutional:      General: She is awake.  HENT:     Head: Normocephalic and atraumatic.  Pulmonary:     Effort: No tachypnea, accessory muscle usage or respiratory distress.  Skin:    General: Skin is warm and dry.  Neurological:     Mental Status: She is alert.     Comments:  Dysarthria. Nods head yes/no to questions. Left hemiparesis  Psychiatric:        Mood and Affect: Mood normal.            Vital Signs: BP 99/78 (BP Location: Right Arm)   Pulse 78   Temp 98.3 F (36.8 C) (Oral)   Resp 15   Ht 5\' 5"  (1.651 m)   Wt 54 kg   SpO2 96%   BMI 19.81 kg/m  SpO2: SpO2: 96 % O2 Device:  O2 Device: Nasal Cannula O2 Flow Rate: O2 Flow Rate (L/min): 2 L/min  Intake/output summary:   Intake/Output Summary (Last 24 hours) at 10/12/2018 1453 Last data filed at 10/12/2018 0900 Gross per 24 hour  Intake 2524.11 ml  Output 350 ml  Net 2174.11 ml   LBM: Last BM Date: 10/11/18 Baseline Weight: Weight: 57.1 kg Most recent weight: Weight: 54 kg       Palliative Assessment/Data: PPS 40%    Flowsheet Rows     Most Recent Value  Intake Tab  Referral Department  Hospitalist  Unit at Time of Referral  Med/Surg Unit  Palliative Care Primary Diagnosis  Other (Comment)  Palliative Care Type  New Palliative care  Reason for referral  Clarify Goals of Care  Date first seen by Palliative Care  10/11/18  Clinical Assessment  Palliative Performance Scale Score  40%  Psychosocial & Spiritual Assessment  Palliative Care Outcomes  Patient/Family meeting held?  Yes  Who was at the meeting?  husband, brother  Palliative Care Outcomes  Clarified goals of care, Provided psychosocial or spiritual support, ACP counseling assistance, Linked to palliative care logitudinal support      Patient Active Problem List   Diagnosis Date Noted  . Failure to thrive in adult 10/11/2018  . Acute renal failure superimposed on stage 3 chronic kidney disease ([place]) 10/11/2018  . Palliative care by specialist   . Acute prerenal azotemia 10/10/2018  . Hypernatremia 10/10/2018  . AKI (acute kidney injury) ([place]) 10/10/2018  . Paroxysmal atrial fibrillation ([place]) 10/10/2018  . Abnormality of gait 05/04/2018  . Acute exacerbation of congestive heart failure ([place]) 03/28/2018  . Left  hemiplegia ([place])   . Chronic kidney disease   . Hypokalemia   . Acute on chronic anemia   . Hypoalbuminemia due to protein-calorie malnutrition ([place])   . Hemiparesis affecting left side as late effect of stroke ([place]) 02/16/2018  . Left-sided neglect 02/16/2018  . Right middle cerebral artery stroke ([place]) 02/16/2018  . Acute embolic stroke ([place])   . Acute right MCA stroke ([place])   . History of CVA with residual deficit   . Benign essential HTN   . Chronic combined systolic and diastolic congestive heart failure ([place])   . History of fusion of cervical spine   . Stage 3 chronic kidney disease ([place])   . Acute blood loss anemia   . Stroke ([place]) 02/10/2018  . Middle cerebral artery embolism, right 02/10/2018  . Goals of care, counseling/discussion 11/01/2017  . Non-ischemic cardiomyopathy ([place]) 09/14/2017  . Precordial chest pain 09/14/2017  . B12 deficiency 09/13/2017  . Tachycardia 09/13/2017  . Acute ischemic stroke ([place])   . Seasonal allergies 07/13/2017  . TIA (transient ischemic attack) 07/12/2017  . Anemia 07/12/2017  . Pneumonia 06/02/2017  . Pleural effusion 06/02/2017  . Acute on chronic systolic congestive heart failure ([place])   . Elevated troponin   . Other fatigue 05/04/2017  . Dyspnea 05/04/2017  . Pseudophakia of both eyes 08/06/2016  . Chronic combined systolic and diastolic CHF (congestive heart failure) ([place]) 07/09/2016  . Hypertensive heart disease   . Hypertension   . Nuclear sclerosis, bilateral 05/24/2016  . HTN (hypertension) 01/22/2013  . TOBACCO ABUSE 02/18/2008  . CARDIOMYOPATHY 02/18/2008  . Diverticulosis of colon 02/18/2008  . OSTEOPENIA 02/18/2008  . Disorder of bone and cartilage 02/18/2008    Palliative Care Assessment & Plan   Patient Profile: 78 y.o. female  with past medical history of CVA April 2019 and November 2019  with resulting left hemiplegia, afib on Eliquis, combined systolic and diastolic CHF, cardiomyopathy with EF 25%,  HTN,  osteopenia, colon polyps, past smoker admitted on 10/10/2018 with decrease in appetite and dehydration. PCP recommended ER visit with labs revealing electrolyte imbalance and increase in renal function. In ED, NA 151 and BUN/creat 94/2.58. Chest xray negative for acute findings. Palliative medicine consultation for goals of care.   Assessment: Hx of Stroke with hemiparesis Failure to thrive Combined systolic and diastolic CHF EF 84/16% Paroxysmal atrial fibrillation Dehydration/AKI with underlying CKD 3  Recommendations/Plan:  FULL code/FULL scope treatment. Patient/family decision to pursue PEG tube placement. Likely 7/17.  Discussed Jack with husband and brother on 7/16. Husband reports patient does not have an advance directive and has not previously spoke of Brandy [name] wishes in regards to heroic medical interventions. Encouraged ongoing patient/family discussion and consideration of limitations to care with underlying chronic, irreversible conditions.  Most importantly, family wishes to keep Brandy [name] home and out of a nursing facility. Great support including daily 12+ hours of nurse/aid caregiver support. Also, daughter planning to move in with them soon for additional support.   May benefit from outpatient palliative referral but unfortunately limited in [facility].    Code Status: FULL   Code Status Orders  (From admission, onward)         Start     Ordered   10/10/18 2343  Full code  Continuous     10/10/18 2343        Code Status History    Date Active Date Inactive Code Status Order ID Comments User Context   03/29/2018 0406 04/04/2018 2153 Full Code [id number]  Bennie Pierini, MD ED   02/16/2018 1904 03/16/2018 1629 Full Code [id number]  Cathlyn Parsons, PA-C Inpatient   02/10/2018 1822 02/14/2018 1009 Full Code [id number]  Luanne Bras, MD Inpatient   02/10/2018 1232 02/10/2018 1822 Full Code [id number]  Vonzella Nipple, NP Inpatient   09/13/2017 2017 09/14/2017  1656 Full Code [id number]  Jani Gravel, MD ED   07/12/2017 2204 07/14/2017 1350 Full Code [id number]  Reubin Milan, MD ED   07/12/2017 2204 07/12/2017 2204 Full Code [id number]  Reubin Milan, MD ED   06/02/2017 2014 06/07/2017 1605 Full Code [id number]  Jani Gravel, MD Inpatient   Advance Care Planning Activity       Prognosis:   Unable to determine  Discharge Planning:  Home with [place] was discussed with patient, husband, SLP  Thank you for allowing the Palliative Medicine Team to assist in the care of this patient.   Time In: 1420 Time Out: 1440 Total Time 20 Prolonged Time Billed no      Greater than 50%  of this time was spent counseling and coordinating care related to the above assessment and plan.  Ihor Dow, FNP-C Palliative Medicine Team  Phone: [phone] Fax: [phone]  Please contact Palliative Medicine Team phone at [phone] for questions and concerns.

## 2018-10-12 NOTE — Progress Notes (Signed)
Initial Nutrition Assessment  DOCUMENTATION CODES:  Severe malnutrition in context of chronic illness  INTERVENTION:  Per Gen Surg,  PEG placement planned 7/16. Will initially meet 100% of pt kcal/pro needs w/ TF. Can be adjusted based on her oral intake later as outpatient  Start with 4 oz of Osmolite 1.5 w/  5x daily x24-48 hrs. Flush 60 ml before/after each feed. Recommend monitoring mag/k/phos q12 hrs during this period for signs of refeeding. If tolerating, advance to goal:  1 can (237 mls) Osmolite 1.5 5x daily. Flush w/ 30 ml before/after each bolus. +236ml free water Bolus BID to meet remainder of fluid needs  Goal TF regimen provides: 1775 kcals, 75g Pro and 905 mls free water (+300 cc from maintenance flushes and + 400 cc from free water boluses)  NUTRITION DIAGNOSIS:  Inadequate oral intake related to poor appetite (Reportedly d/t CVA) as evidenced by a loss of ~25% bw in the past year  GOAL:  Patient will meet greater than or equal to 90% of their needs  MONITOR:  Labs, I & O's, Skin, TF tolerance, PO intake, Weight trends, Supplement acceptance  REASON FOR ASSESSMENT:  Consult Enteral/tube feeding initiation and management, Assessment of nutrition requirement/status  ASSESSMENT:  78 y/o female PMHx HTN, Afib, CHF, Tobacco Abuse, CKD3, CVA. Pt reportedly has been declining since she had a R MCA CVA in November of 2019, eating and drinking very little. Brought to ED 7/14 at PCP request after pts labwork showed dehydration. Family has been discussing potential feeding tube and she has been admitted for tentative PEG placement  Pt nonverbal with RD. Called and spoke with Spouse. He states the patients oral intake has been relatively poor since her CVA, but it really came to an extreme point a few weeks ago. Quantity wise, he says they try to feed her 2-3x a day, but she only takes small bites. She also will drink some Ensure, but it is only sips and does not equate to even a  full bottle. He says the only reason she gives for not eating is "I feel full". Denies any n/vc/d, trouble chewing/swallowing or abdominal pain. He says she was not taking any vitamin or mineral supplements at home. Noted pt is on megace. Spouse does not remember when it was started, but it wasn't recently and it hasnt had any benefit.   Pt weighed yesterday at 119 lbs via bedscale. Per chart, her wt has indeed gradually declined since her CVA last November. She was 158 lbs at this time last year and 147 lbs at the time of her stroke. This equates to a loss of 28 lbs or 19% bw since her stroke last fall and 39 lbs (~24.6% bw) in the past year.   Given her minimal intake for the past several weeks and severe weight loss, pt will be initially at risk for intolerance and electrolyte abnormalities. Will be prudent and start with half-sized boluses x24-48 hrs. RD discussed this plan with spouse. Given her current severe malnutrition, will meet 100% of needs with TF initially. Will also add mvi given history of no supplementation. RD stated to spouse they could scale back the patients TF later if she starts to starts regularly taking intake by mouth. He reports understanding.     Patient meets criteria for severe malnutrition based on her degree of weight  Loss (~25% bw x1 year) and reported level of oral intake (<75% of needs for >/= to ~1 month).    Labs: BUN/Creat:  94/2.58  -> 73/1.86, Na: 151->138.  Albumin:3.8,  Meds: Ritalin, Megace, Aricept, IVF  Recent Labs  Lab 10/10/18 2041 10/11/18 0515 10/12/18 0624  NA 151* 148* 138  K 4.9 4.4 4.0  CL 122* 122* 110  CO2 21* 21* 20*  BUN 94* 91* 73*  CREATININE 2.58* 2.32* 1.86*  CALCIUM 10.0 9.3 9.1  MG  --   --  2.1  GLUCOSE 117* 136* 117*   NUTRITION - FOCUSED PHYSICAL EXAM: Deferred   Diet Order:   Diet Order            Diet Heart Room service appropriate? Yes; Fluid consistency: Thin  Diet effective now             EDUCATION NEEDS:   Education needs have been addressed  Skin: Abrasion to bilateral feet  Last BM:  7/15  Height:  Ht Readings from Last 1 Encounters:  10/11/18 5\' 5"  (1.651 m)   Weight:  Wt Readings from Last 1 Encounters:  10/11/18 54 kg   Wt Readings from Last 10 Encounters:  10/11/18 54 kg  04/04/18 57.1 kg  02/16/18 85.7 kg  02/13/18 68.5 kg  02/08/18 66.7 kg  11/01/17 71.6 kg  10/27/17 71.5 kg  10/20/17 71.7 kg  09/28/17 72.6 kg  09/14/17 75.4 kg   Ideal Body Weight:  51.14 kg(Adjusted -10% for bedbound state.)  BMI:  Body mass index is 19.81 kg/m.  Estimated Nutritional Needs:  Kcal:  1550-1750 kcals (29-32 kcals/kg bw) Protein:  70-80g Pro (1.3-1.5g/kg bw) Fluid:  1.4-1.6 L Fluid (25-30 ml/kg bw)  Burtis Junes RD, LDN, CNSC Clinical Nutrition Available Tues-Sat via Pager: [phone] 10/12/2018 2:17 PM

## 2018-10-12 NOTE — Progress Notes (Signed)
PROGRESS NOTE  Brandy [name] ZDG:[id number] DOB: [birth year] DOA: 10/10/2018 PCP: [name], MD  Brief History:  78 year old female with history of hypertension and hypertensive heart disease, systolic and diastolic CHF with  EF 95%, hypertension, paroxysmal atrial fibrillation, stroke with left hemiparesis, COPD, and hyperlipidemia presenting with "dehydration".  Patient was brought into the hospital per his PCP request secondary to symptoms of failure to thrive with poor oral intake and lab suggesting dehydration. The patient has had decreased oral intake for approximately 1 month.  At baseline, the patient is nonambulatory and nonverbal but is alert and responsive by nodding.  There is not been any history of vomiting, diarrhea, fevers, chills, headache, neck pain.  Upon presentation, the patient was afebrile hemodynamically stable albeit with soft blood pressures in the 90s.  Sodium was 151 with serum creatinine 2.58.  WBC was 9.9.  Urinalysis did not show any pyuria.  Patient was started on IV fluids.  Palliative medicine was consulted to assist with management.  Patient's apixaban was held in anticipation for possible gastrostomy tube placement on 10/13/2018.  Assessment/Plan: Acute on chronic renal failure--CKD 3 -Baseline creatinine 0.9-1.1 -Presented with serum creatinine 2.58 -Continue IV fluids--slowly improving but not at baseline -Secondary to dehydration volume depletion  Failure to thrive -Serum J88--416 -Folic SAYT--0.1 -PT [id number]--73 hour supervision -TSH--1.143 -Continue Megace -discussed with spouse and brother who wish to pursue G-tube -pt has no desire to eat or drink -planning PEG 10/13/18  Hypernatremia -Secondary to volume depletion -Judicious hypotonic fluids-->improved  Chronic combined systolic and diastolic CHF -04/30/252 echo EF 20-25%, grade 2 DD, diffuse HK, moderate MR, PASP 49 -Clinically volume depleted presently -Daily weights  -Continue carvedilol  Stroke with hemiparesis -PT evaluation--24 hour supervision -Continue apixaban and Lipitor  Paroxysmal atrial fibrillation -Currently in sinus rhythm -Holding apixaban in preparation for possible gastrostomy tube placement  Hyperlipidemia -continue statin  Severe Malnutrition -start enteral feeding after G-tube placement 10/13/18  Goals of Care -palliative consult -discussed with brother and spouse--FULL CODE -they wish to pursue G-tube-->consult GI     Disposition Plan:   Home in 1-2  days  Family Communication:   Spouse updated 7/16  Consultants:  GI, palliative, general surgery  Code Status:  FULL  DVT Prophylaxis:  SCDs   Procedures: As Listed in Progress Note Above  Antibiotics: None    Subjective: Patient denies fevers, chills, headache, chest pain, dyspnea, nausea, vomiting, diarrhea, abdominal pain, dysuria, hematuria, hematochezia, and melena.   Objective: Vitals:   10/11/18 2122 10/12/18 0517 10/12/18 0755 10/12/18 1500  BP: 99/70 99/78  (!) 97/57  Pulse: 72 78  71  Resp: 16 15  16   Temp: 98.5 F (36.9 C) 98.3 F (36.8 C)  97.9 F (36.6 C)  TempSrc: Oral Oral    SpO2: 100% 100% 96% 99%  Weight:      Height:        Intake/Output Summary (Last 24 hours) at 10/12/2018 1726 Last data filed at 10/12/2018 0900 Gross per 24 hour  Intake 2524.11 ml  Output 350 ml  Net 2174.11 ml   Weight change:  Exam:   General:  Pt is alert, follows commands appropriately, not in acute distress  HEENT: No icterus, No thrush, No neck mass, [place]/AT  Cardiovascular: RRR, S1/S2, no rubs, no gallops  Respiratory: CTA bilaterally, no wheezing, no crackles, no rhonchi  Abdomen: Soft/+BS, non tender, non distended, no guarding  Extremities: No edema, No lymphangitis, No petechiae,  No rashes, no synovitis   Data Reviewed: I have personally reviewed following labs and imaging studies Basic Metabolic Panel: Recent Labs   Lab 10/10/18 2041 10/11/18 0515 10/12/18 0624  NA 151* 148* 138  K 4.9 4.4 4.0  CL 122* 122* 110  CO2 21* 21* 20*  GLUCOSE 117* 136* 117*  BUN 94* 91* 73*  CREATININE 2.58* 2.32* 1.86*  CALCIUM 10.0 9.3 9.1  MG  --   --  2.1   Liver Function Tests: Recent Labs  Lab 10/10/18 2041  AST 30  ALT 16  ALKPHOS 54  BILITOT 0.5  PROT 7.1  ALBUMIN 3.8   No results for input(s): LIPASE, AMYLASE in the last 168 hours. No results for input(s): AMMONIA in the last 168 hours. Coagulation Profile: No results for input(s): INR, PROTIME in the last 168 hours. CBC: Recent Labs  Lab 10/10/18 2041  WBC 9.9  NEUTROABS 6.5  HGB 13.3  HCT 44.6  MCV 95.3  PLT 247   Cardiac Enzymes: No results for input(s): CKTOTAL, CKMB, CKMBINDEX, TROPONINI in the last 168 hours. BNP: Invalid input(s): POCBNP CBG: No results for input(s): GLUCAP in the last 168 hours. HbA1C: No results for input(s): HGBA1C in the last 72 hours. Urine analysis:    Component Value Date/Time   COLORURINE YELLOW 10/10/2018 1951   APPEARANCEUR CLEAR 10/10/2018 1951   LABSPEC 1.014 10/10/2018 1951   PHURINE 5.0 10/10/2018 1951   GLUCOSEU NEGATIVE 10/10/2018 1951   HGBUR NEGATIVE 10/10/2018 1951   BILIRUBINUR NEGATIVE 10/10/2018 [place] NEGATIVE 10/10/2018 1951   PROTEINUR NEGATIVE 10/10/2018 1951   NITRITE NEGATIVE 10/10/2018 1951   LEUKOCYTESUR NEGATIVE 10/10/2018 1951   Sepsis Labs: @LABRCNTIP (procalcitonin:4,lacticidven:4) ) Recent Results (from the past 240 hour(s))  Culture, blood (routine x 2)     Status: None (Preliminary result)   Collection Time: 10/10/18  8:41 PM   Specimen: BLOOD LEFT FOREARM  Result Value Ref Range Status   Specimen Description BLOOD LEFT FOREARM  Final   Special Requests   Final    BOTTLES DRAWN AEROBIC AND ANAEROBIC Blood Culture adequate volume   Culture   Final    NO GROWTH 2 DAYS Performed at [facility], [address], [name], [place] [address]    Report  Status PENDING  Incomplete  Culture, blood (routine x 2)     Status: None (Preliminary result)   Collection Time: 10/10/18  8:42 PM   Specimen: BLOOD RIGHT FOREARM  Result Value Ref Range Status   Specimen Description BLOOD RIGHT FOREARM  Final   Special Requests   Final    BOTTLES DRAWN AEROBIC AND ANAEROBIC Blood Culture adequate volume   Culture   Final    NO GROWTH 2 DAYS Performed at [facility], [address], [place], [place] [address]    Report Status PENDING  Incomplete  SARS Coronavirus 2 (CEPHEID - Performed in [place] hospital lab), Hosp Order     Status: None   Collection Time: 10/10/18  9:20 PM   Specimen: Nasopharyngeal Swab  Result Value Ref Range Status   SARS Coronavirus 2 NEGATIVE NEGATIVE Final    Comment: (NOTE) If result is NEGATIVE SARS-CoV-2 target nucleic acids are NOT DETECTED. The SARS-CoV-2 RNA is generally detectable in upper and lower  respiratory specimens during the acute phase of infection. The lowest  concentration of SARS-CoV-2 viral copies this assay can detect is 250  copies / mL. A negative result does not preclude SARS-CoV-2 infection  and should  not be used as the sole basis for treatment or other  patient management decisions.  A negative result may occur with  improper specimen collection / handling, submission of specimen other  than nasopharyngeal swab, presence of viral mutation(s) within the  areas targeted by this assay, and inadequate number of viral copies  (<250 copies / mL). A negative result must be combined with clinical  observations, patient history, and epidemiological information. If result is POSITIVE SARS-CoV-2 target nucleic acids are DETECTED. The SARS-CoV-2 RNA is generally detectable in upper and lower  respiratory specimens dur ing the acute phase of infection.  Positive  results are indicative of active infection with SARS-CoV-2.  Clinical  correlation with patient history and other diagnostic information is   necessary to determine patient infection status.  Positive results do  not rule out bacterial infection or co-infection with other viruses. If result is PRESUMPTIVE POSTIVE SARS-CoV-2 nucleic acids MAY BE PRESENT.   A presumptive positive result was obtained on the submitted specimen  and confirmed on repeat testing.  While 2019 novel coronavirus  (SARS-CoV-2) nucleic acids may be present in the submitted sample  additional confirmatory testing may be necessary for epidemiological  and / or clinical management purposes  to differentiate between  SARS-CoV-2 and other Sarbecovirus currently known to infect humans.  If clinically indicated additional testing with an alternate test  methodology [phone]) is advised. The SARS-CoV-2 RNA is generally  detectable in upper and lower respiratory sp ecimens during the acute  phase of infection. The expected result is Negative. Fact Sheet for Patients:  StrictlyIdeas.no Fact Sheet for Healthcare Providers: BankingDealers.co.za This test is not yet approved or cleared by the [place] FDA and has been authorized for detection and/or diagnosis of SARS-CoV-2 by FDA under an Emergency Use Authorization (EUA).  This EUA will remain in effect (meaning this test can be used) for the duration of the COVID-19 declaration under Section 564(b)(1) of the Act, 21 U.S.C. section 360bbb-3(b)(1), unless the authorization is terminated or revoked sooner. Performed at [facility], [address] [place],  [address]      Scheduled Meds: . atorvastatin  5 mg Oral q morning - 10a  . carvedilol  3.125 mg Oral BID  . citalopram  10 mg Oral Daily  . donepezil  5 mg Oral QHS  . [START ON 10/13/2018] feeding supplement (OSMOLITE 1.5 CAL)  120 mL Per Tube 5 X Daily  . isosorbide mononitrate  15 mg Oral q morning - 10a  . megestrol  100 mg Oral BID  . methylphenidate  5 mg Oral BID WC  . multivitamin  15 mL  Oral Daily  . umeclidinium-vilanterol  1 puff Inhalation Daily   Continuous Infusions: . dextrose 5 % and 0.45% NaCl      Procedures/Studies: Dg Chest Portable 1 View  Result Date: 10/10/2018 CLINICAL DATA:  Pt with decreased appetite and weakness. Pt had recent stroke, so best image possible due to rigidity. Hx CHF, HTNWeakness EXAM: PORTABLE CHEST 1 VIEW COMPARISON:  Radiograph 03/29/2018 FINDINGS: Normal cardiac silhouette. Low lung volumes. No effusion, infiltrate or pneumothorax. Anterior cervical fusion. IMPRESSION: No acute cardiopulmonary process. Electronically Signed   By: Suzy Bouchard M.D.   On: 10/10/2018 20:56    Orson Eva, DO  [facility] Pager [phone]  If 7PM-7AM, please contact night-coverage [email or web address] Password [email or web address] 10/12/2018, 5:26 PM   LOS: 1 day

## 2018-10-12 NOTE — Consult Note (Signed)
Reason for Consult: Request for gastrostomy tube placement Referring Physician: Dr. Reynaldo Minium is an 78 y.o. female.  HPI: Patient is a 78 year old black female with multiple medical problems including stroke, TIA, cardiomyopathy, atrial fibrillation with chronic anticoagulation who presented to the hospital with decreased appetite and dehydration.  She was noted to have an electrolyte imbalance and was admitted to the hospital for further evaluation and treatment.  Patient apparently has had significant deterioration and failure to thrive for some time now.  Family has requested a gastrostomy tube placement.  Patient unable to give a significant history.  Past Medical History:  Diagnosis Date  . Cardiomyopathy    a. EF of 25% in 07/2006 b. EF normalized by repeat echo in 2011 c. EF 35-40% by echo in 05/2017 with cath showing mild nonobstructive CAD  . Chronic combined systolic and diastolic CHF (congestive heart failure) ([place])   . Colon polyps   . Diverticulosis of colon   . Hypertension   . Hypertensive heart disease   . Osteopenia   . Paroxysmal atrial fibrillation ([place]) 10/10/2018  . Stroke ([place])   . TIA (transient ischemic attack)   . Tobacco abuse    50 pack year    Past Surgical History:  Procedure Laterality Date  . BREAST BIOPSY    . CARPAL TUNNEL RELEASE     right hand  . CERVICAL FUSION    . CESAREAN SECTION     2 times  . COLONOSCOPY  5701   pt uncertain as to whether polypectomy required or performed  . ECTOPIC PREGNANCY SURGERY    . IR ANGIO VERTEBRAL SEL SUBCLAVIAN INNOMINATE UNI R MOD SED  02/10/2018  . IR CT HEAD LTD  02/10/2018  . IR PERCUTANEOUS ART THROMBECTOMY/INFUSION INTRACRANIAL INC DIAG ANGIO  02/10/2018  . RADIOLOGY WITH ANESTHESIA N/A 02/10/2018   Procedure: RADIOLOGY WITH ANESTHESIA;  Surgeon: Luanne Bras, MD;  Location: [place];  Service: Radiology;  Laterality: N/A;  . RIGHT/LEFT HEART CATH AND CORONARY ANGIOGRAPHY N/A 06/03/2017    Procedure: RIGHT/LEFT HEART CATH AND CORONARY ANGIOGRAPHY;  Surgeon: Jettie Booze, MD;  Location: [facility];  Service: Cardiovascular;  Laterality: N/A;  . TEE WITHOUT CARDIOVERSION N/A 02/13/2018   Procedure: TRANSESOPHAGEAL ECHOCARDIOGRAM (TEE);  Surgeon: Sueanne Margarita, MD;  Location: [facility] ENDOSCOPY;  Service: Cardiovascular;  Laterality: N/A;  . TONSILLECTOMY      Family History  Problem Relation Age of Onset  . Hypertension Brother   . Hypertension Maternal Grandmother   . Cancer Paternal Grandmother     Social History:  reports that she quit smoking about 10 years ago. Her smoking use included cigarettes. She started smoking about 58 years ago. She has a 50.00 pack-year smoking history. She has never used smokeless tobacco. She reports that she does not drink alcohol or use drugs.  Allergies:  Allergies  Allergen Reactions  . Lisinopril Swelling    Medications: I have reviewed the patient's current medications.  Results for orders placed or performed during the hospital encounter of 10/10/18 (from the past 48 hour(s))  Urinalysis, Routine w reflex microscopic     Status: None   Collection Time: 10/10/18  7:51 PM  Result Value Ref Range   Color, Urine YELLOW YELLOW   APPearance CLEAR CLEAR   Specific Gravity, Urine 1.014 1.005 - 1.030   pH 5.0 5.0 - 8.0   Glucose, UA NEGATIVE NEGATIVE mg/dL   Hgb urine dipstick NEGATIVE NEGATIVE   Bilirubin Urine NEGATIVE  NEGATIVE   Ketones, ur NEGATIVE NEGATIVE mg/dL   Protein, ur NEGATIVE NEGATIVE mg/dL   Nitrite NEGATIVE NEGATIVE   Leukocytes,Ua NEGATIVE NEGATIVE    Comment: Performed at [facility], [address], [place], [place] [address]  Comprehensive metabolic panel     Status: Abnormal   Collection Time: 10/10/18  8:41 PM  Result Value Ref Range   Sodium 151 (H) 135 - 145 mmol/L   Potassium 4.9 3.5 - 5.1 mmol/L   Chloride 122 (H) 98 - 111 mmol/L   CO2 21 (L) 22 - 32 mmol/L   Glucose, Bld 117 (H) 70 - 99  mg/dL   BUN 94 (H) 8 - 23 mg/dL   Creatinine, Ser 2.58 (H) 0.44 - 1.00 mg/dL   Calcium 10.0 8.9 - 10.3 mg/dL   Total Protein 7.1 6.5 - 8.1 g/dL   Albumin 3.8 3.5 - 5.0 g/dL   AST 30 15 - 41 U/L   ALT 16 0 - 44 U/L   Alkaline Phosphatase 54 38 - 126 U/L   Total Bilirubin 0.5 0.3 - 1.2 mg/dL   GFR calc non Af Amer 17 (L) >60 mL/min   GFR calc Af Amer 20 (L) >60 mL/min   Anion gap 8 5 - 15    Comment: Performed at [facility], [address] [place], [place] [address]  CBC with Differential     Status: Abnormal   Collection Time: 10/10/18  8:41 PM  Result Value Ref Range   WBC 9.9 4.0 - 10.5 K/uL   RBC 4.68 3.87 - 5.11 MIL/uL   Hemoglobin 13.3 12.0 - 15.0 g/dL   HCT 44.6 36.0 - 46.0 %   MCV 95.3 80.0 - 100.0 fL   MCH 28.4 26.0 - 34.0 pg   MCHC 29.8 (L) 30.0 - 36.0 g/dL   RDW 14.2 11.5 - 15.5 %   Platelets 247 150 - 400 K/uL   nRBC 0.0 0.0 - 0.2 %   Neutrophils Relative % 66 %   Neutro Abs 6.5 1.7 - 7.7 K/uL   Lymphocytes Relative 25 %   Lymphs Abs 2.5 0.7 - 4.0 K/uL   Monocytes Relative 7 %   Monocytes Absolute 0.7 0.1 - 1.0 K/uL   Eosinophils Relative 2 %   Eosinophils Absolute 0.2 0.0 - 0.5 K/uL   Basophils Relative 0 %   Basophils Absolute 0.0 0.0 - 0.1 K/uL   Immature Granulocytes 0 %   Abs Immature Granulocytes 0.04 0.00 - 0.07 K/uL    Comment: Performed at [facility], [address] [place], [place] [address]  Lactic acid, plasma     Status: Abnormal   Collection Time: 10/10/18  8:41 PM  Result Value Ref Range   Lactic Acid, Venous 2.4 (HH) 0.5 - 1.9 mmol/L    Comment: CRITICAL RESULT CALLED TO, READ BACK BY AND VERIFIED WITH: CRAWFORD,H ON 10/10/18 AT 2120 BY LOY,C Performed at [facility], [address] [place], [place] [address]   Culture, blood (routine x 2)     Status: None (Preliminary result)   Collection Time: 10/10/18  8:41 PM   Specimen: BLOOD LEFT FOREARM  Result Value Ref Range   Specimen Description BLOOD LEFT FOREARM    Special Requests       BOTTLES DRAWN AEROBIC AND ANAEROBIC Blood Culture adequate volume   Culture      NO GROWTH 2 DAYS Performed at [facility], [address] [place], [place] [address]    Report Status PENDING  Culture, blood (routine x 2)     Status: None (Preliminary result)   Collection Time: 10/10/18  8:42 PM   Specimen: BLOOD RIGHT FOREARM  Result Value Ref Range   Specimen Description BLOOD RIGHT FOREARM    Special Requests      BOTTLES DRAWN AEROBIC AND ANAEROBIC Blood Culture adequate volume   Culture      NO GROWTH 2 DAYS Performed at [facility], [address], [place], [place] [address]    Report Status PENDING   SARS Coronavirus 2 (CEPHEID - Performed in [place] hospital lab), Hosp Order     Status: None   Collection Time: 10/10/18  9:20 PM   Specimen: Nasopharyngeal Swab  Result Value Ref Range   SARS Coronavirus 2 NEGATIVE NEGATIVE    Comment: (NOTE) If result is NEGATIVE SARS-CoV-2 target nucleic acids are NOT DETECTED. The SARS-CoV-2 RNA is generally detectable in upper and lower  respiratory specimens during the acute phase of infection. The lowest  concentration of SARS-CoV-2 viral copies this assay can detect is 250  copies / mL. A negative result does not preclude SARS-CoV-2 infection  and should not be used as the sole basis for treatment or other  patient management decisions.  A negative result may occur with  improper specimen collection / handling, submission of specimen other  than nasopharyngeal swab, presence of viral mutation(s) within the  areas targeted by this assay, and inadequate number of viral copies  (<250 copies / mL). A negative result must be combined with clinical  observations, patient history, and epidemiological information. If result is POSITIVE SARS-CoV-2 target nucleic acids are DETECTED. The SARS-CoV-2 RNA is generally detectable in upper and lower  respiratory specimens dur ing the acute phase of infection.  Positive  results are  indicative of active infection with SARS-CoV-2.  Clinical  correlation with patient history and other diagnostic information is  necessary to determine patient infection status.  Positive results do  not rule out bacterial infection or co-infection with other viruses. If result is PRESUMPTIVE POSTIVE SARS-CoV-2 nucleic acids MAY BE PRESENT.   A presumptive positive result was obtained on the submitted specimen  and confirmed on repeat testing.  While 2019 novel coronavirus  (SARS-CoV-2) nucleic acids may be present in the submitted sample  additional confirmatory testing may be necessary for epidemiological  and / or clinical management purposes  to differentiate between  SARS-CoV-2 and other Sarbecovirus currently known to infect humans.  If clinically indicated additional testing with an alternate test  methodology [phone] is advised. The SARS-CoV-2 RNA is generally  detectable in upper and lower respiratory sp ecimens during the acute  phase of infection. The expected result is Negative. Fact Sheet for Patients:  StrictlyIdeas.no Fact Sheet for Healthcare Providers: BankingDealers.co.za This test is not yet approved or cleared by the [place] FDA and has been authorized for detection and/or diagnosis of SARS-CoV-2 by FDA under an Emergency Use Authorization (EUA).  This EUA will remain in effect (meaning this test can be used) for the duration of the COVID-19 declaration under Section 564(b)(1) of the Act, 21 U.S.C. section 360bbb-3(b)(1), unless the authorization is terminated or revoked sooner. Performed at [facility], [address] [place], [place] [address]   Lactic acid, plasma     Status: None   Collection Time: 10/10/18 11:02 PM  Result Value Ref Range   Lactic Acid, Venous 1.2 0.5 - 1.9 mmol/L    Comment: Performed at [facility] [address],  [place], [place] [address]  Basic metabolic panel     Status: Abnormal    Collection Time: 10/11/18  5:15 AM  Result Value Ref Range   Sodium 148 (H) 135 - 145 mmol/L   Potassium 4.4 3.5 - 5.1 mmol/L   Chloride 122 (H) 98 - 111 mmol/L   CO2 21 (L) 22 - 32 mmol/L   Glucose, Bld 136 (H) 70 - 99 mg/dL   BUN 91 (H) 8 - 23 mg/dL   Creatinine, Ser 2.32 (H) 0.44 - 1.00 mg/dL   Calcium 9.3 8.9 - 10.3 mg/dL   GFR calc non Af Amer 20 (L) >60 mL/min   GFR calc Af Amer 23 (L) >60 mL/min   Anion gap 5 5 - 15    Comment: Performed at [facility], [address], [place], [place] [address]  TSH     Status: None   Collection Time: 10/12/18  6:24 AM  Result Value Ref Range   TSH 1.143 0.350 - 4.500 uIU/mL    Comment: Performed by a 3rd Generation assay with a functional sensitivity of <=0.01 uIU/mL. Performed at [facility], [address] [place], [place] [address]   Vitamin B12     Status: None   Collection Time: 10/12/18  6:24 AM  Result Value Ref Range   Vitamin B-12 511 180 - 914 pg/mL    Comment: (NOTE) This assay is not validated for testing neonatal or myeloproliferative syndrome specimens for Vitamin B12 levels. Performed at [facility], [address] [place], [place] [address]   Folate     Status: None   Collection Time: 10/12/18  6:24 AM  Result Value Ref Range   Folate 9.1 >5.9 ng/mL    Comment: Performed at [facility], [address], [place], [place] [address]  Basic metabolic panel     Status: Abnormal   Collection Time: 10/12/18  6:24 AM  Result Value Ref Range   Sodium 138 135 - 145 mmol/L    Comment: DELTA CHECK NOTED   Potassium 4.0 3.5 - 5.1 mmol/L   Chloride 110 98 - 111 mmol/L   CO2 20 (L) 22 - 32 mmol/L   Glucose, Bld 117 (H) 70 - 99 mg/dL   BUN 73 (H) 8 - 23 mg/dL   Creatinine, Ser 1.86 (H) 0.44 - 1.00 mg/dL   Calcium 9.1 8.9 - 10.3 mg/dL   GFR calc non Af Amer 26 (L) >60 mL/min   GFR calc Af Amer 30 (L) >60 mL/min   Anion gap 8 5 - 15    Comment: Performed at [facility], [address], [place], [place] [address]   Magnesium     Status: None   Collection Time: 10/12/18  6:24 AM  Result Value Ref Range   Magnesium 2.1 1.7 - 2.4 mg/dL    Comment: Performed at [facility], [address], [place], [place] [address]    Dg Chest Portable 1 View  Result Date: 10/10/2018 CLINICAL DATA:  Pt with decreased appetite and weakness. Pt had recent stroke, so best image possible due to rigidity. Hx CHF, HTNWeakness EXAM: PORTABLE CHEST 1 VIEW COMPARISON:  Radiograph 03/29/2018 FINDINGS: Normal cardiac silhouette. Low lung volumes. No effusion, infiltrate or pneumothorax. Anterior cervical fusion. IMPRESSION: No acute cardiopulmonary process. Electronically Signed   By: Suzy Bouchard M.D.   On: 10/10/2018 20:56    ROS:  Review of systems not obtained due to patient factors.  Blood pressure 99/78, pulse 78, temperature 98.3 F (36.8 C), temperature  source Oral, resp. rate 15, height 5\' 5"  (1.651 m), weight 54 kg, SpO2 96 %. Physical Exam: Comfortable black female no acute distress Lungs clear to auscultation with good breath sounds bilaterally Heart examination reveals regular rate and rhythm Abdomen is soft with and lower midline surgical scar present.  Hospitalist, palliative care notes reviewed  Assessment/Plan: Impression: Failure to thrive, malnutrition Plan: I discussed the procedure with the patient's husband.  He would like to proceed with gastrostomy tube placement.  The risks and benefits of the procedure including bleeding, infection, and organ injury were fully explained to the patient's husband, who gave informed consent.  Aviva Signs 10/12/2018, 8:29 AM

## 2018-10-12 NOTE — H&P (View-Only) (Signed)
Reason for Consult: Request for gastrostomy tube placement Referring Physician: Dr. Reynaldo Minium is an 78 y.o. female.  HPI: Patient is a 78 year old black female with multiple medical problems including stroke, TIA, cardiomyopathy, atrial fibrillation with chronic anticoagulation who presented to the hospital with decreased appetite and dehydration.  She was noted to have an electrolyte imbalance and was admitted to the hospital for further evaluation and treatment.  Patient apparently has had significant deterioration and failure to thrive for some time now.  Family has requested a gastrostomy tube placement.  Patient unable to give a significant history.  Past Medical History:  Diagnosis Date  . Cardiomyopathy    a. EF of 25% in 07/2006 b. EF normalized by repeat echo in 2011 c. EF 35-40% by echo in 05/2017 with cath showing mild nonobstructive CAD  . Chronic combined systolic and diastolic CHF (congestive heart failure) ([place])   . Colon polyps   . Diverticulosis of colon   . Hypertension   . Hypertensive heart disease   . Osteopenia   . Paroxysmal atrial fibrillation ([place]) 10/10/2018  . Stroke ([place])   . TIA (transient ischemic attack)   . Tobacco abuse    50 pack year    Past Surgical History:  Procedure Laterality Date  . BREAST BIOPSY    . CARPAL TUNNEL RELEASE     right hand  . CERVICAL FUSION    . CESAREAN SECTION     2 times  . COLONOSCOPY  6948   pt uncertain as to whether polypectomy required or performed  . ECTOPIC PREGNANCY SURGERY    . IR ANGIO VERTEBRAL SEL SUBCLAVIAN INNOMINATE UNI R MOD SED  02/10/2018  . IR CT HEAD LTD  02/10/2018  . IR PERCUTANEOUS ART THROMBECTOMY/INFUSION INTRACRANIAL INC DIAG ANGIO  02/10/2018  . RADIOLOGY WITH ANESTHESIA N/A 02/10/2018   Procedure: RADIOLOGY WITH ANESTHESIA;  Surgeon: Luanne Bras, MD;  Location: [place] [facility];  Service: Radiology;  Laterality: N/A;  . RIGHT/LEFT HEART CATH AND CORONARY ANGIOGRAPHY N/A 06/03/2017    Procedure: RIGHT/LEFT HEART CATH AND CORONARY ANGIOGRAPHY;  Surgeon: Jettie Booze, MD;  Location: [facility];  Service: Cardiovascular;  Laterality: N/A;  . TEE WITHOUT CARDIOVERSION N/A 02/13/2018   Procedure: TRANSESOPHAGEAL ECHOCARDIOGRAM (TEE);  Surgeon: Sueanne Margarita, MD;  Location: [facility] ENDOSCOPY;  Service: Cardiovascular;  Laterality: N/A;  . TONSILLECTOMY      Family History  Problem Relation Age of Onset  . Hypertension Brother   . Hypertension Maternal Grandmother   . Cancer Paternal Grandmother     Social History:  reports that she quit smoking about 10 years ago. Her smoking use included cigarettes. She started smoking about 58 years ago. She has a 50.00 pack-year smoking history. She has never used smokeless tobacco. She reports that she does not drink alcohol or use drugs.  Allergies:  Allergies  Allergen Reactions  . Lisinopril Swelling    Medications: I have reviewed the patient's current medications.  Results for orders placed or performed during the hospital encounter of 10/10/18 (from the past 48 hour(s))  Urinalysis, Routine w reflex microscopic     Status: None   Collection Time: 10/10/18  7:51 PM  Result Value Ref Range   Color, Urine YELLOW YELLOW   APPearance CLEAR CLEAR   Specific Gravity, Urine 1.014 1.005 - 1.030   pH 5.0 5.0 - 8.0   Glucose, UA NEGATIVE NEGATIVE mg/dL   Hgb urine dipstick NEGATIVE NEGATIVE   Bilirubin Urine NEGATIVE  NEGATIVE   Ketones, ur NEGATIVE NEGATIVE mg/dL   Protein, ur NEGATIVE NEGATIVE mg/dL   Nitrite NEGATIVE NEGATIVE   Leukocytes,Ua NEGATIVE NEGATIVE    Comment: Performed at [facility], [address], [place], [place] [address]  Comprehensive metabolic panel     Status: Abnormal   Collection Time: 10/10/18  8:41 PM  Result Value Ref Range   Sodium 151 (H) 135 - 145 mmol/L   Potassium 4.9 3.5 - 5.1 mmol/L   Chloride 122 (H) 98 - 111 mmol/L   CO2 21 (L) 22 - 32 mmol/L   Glucose, Bld 117 (H) 70 - 99  mg/dL   BUN 94 (H) 8 - 23 mg/dL   Creatinine, Ser 2.58 (H) 0.44 - 1.00 mg/dL   Calcium 10.0 8.9 - 10.3 mg/dL   Total Protein 7.1 6.5 - 8.1 g/dL   Albumin 3.8 3.5 - 5.0 g/dL   AST 30 15 - 41 U/L   ALT 16 0 - 44 U/L   Alkaline Phosphatase 54 38 - 126 U/L   Total Bilirubin 0.5 0.3 - 1.2 mg/dL   GFR calc non Af Amer 17 (L) >60 mL/min   GFR calc Af Amer 20 (L) >60 mL/min   Anion gap 8 5 - 15    Comment: Performed at [facility], [address], [place], [place] [address]  CBC with Differential     Status: Abnormal   Collection Time: 10/10/18  8:41 PM  Result Value Ref Range   WBC 9.9 4.0 - 10.5 K/uL   RBC 4.68 3.87 - 5.11 MIL/uL   Hemoglobin 13.3 12.0 - 15.0 g/dL   HCT 44.6 36.0 - 46.0 %   MCV 95.3 80.0 - 100.0 fL   MCH 28.4 26.0 - 34.0 pg   MCHC 29.8 (L) 30.0 - 36.0 g/dL   RDW 14.2 11.5 - 15.5 %   Platelets 247 150 - 400 K/uL   nRBC 0.0 0.0 - 0.2 %   Neutrophils Relative % 66 %   Neutro Abs 6.5 1.7 - 7.7 K/uL   Lymphocytes Relative 25 %   Lymphs Abs 2.5 0.7 - 4.0 K/uL   Monocytes Relative 7 %   Monocytes Absolute 0.7 0.1 - 1.0 K/uL   Eosinophils Relative 2 %   Eosinophils Absolute 0.2 0.0 - 0.5 K/uL   Basophils Relative 0 %   Basophils Absolute 0.0 0.0 - 0.1 K/uL   Immature Granulocytes 0 %   Abs Immature Granulocytes 0.04 0.00 - 0.07 K/uL    Comment: Performed at [facility], [address], [place], [place] [address]  Lactic acid, plasma     Status: Abnormal   Collection Time: 10/10/18  8:41 PM  Result Value Ref Range   Lactic Acid, Venous 2.4 (HH) 0.5 - 1.9 mmol/L    Comment: CRITICAL RESULT CALLED TO, READ BACK BY AND VERIFIED WITH: CRAWFORD,H ON 10/10/18 AT 2120 BY LOY,C Performed at [facility], [address], [place], [place] [address]   Culture, blood (routine x 2)     Status: None (Preliminary result)   Collection Time: 10/10/18  8:41 PM   Specimen: BLOOD LEFT FOREARM  Result Value Ref Range   Specimen Description BLOOD LEFT FOREARM    Special Requests       BOTTLES DRAWN AEROBIC AND ANAEROBIC Blood Culture adequate volume   Culture      NO GROWTH 2 DAYS Performed at [facility], [address] [place], [place] [address]    Report Status PENDING  Culture, blood (routine x 2)     Status: None (Preliminary result)   Collection Time: 10/10/18  8:42 PM   Specimen: BLOOD RIGHT FOREARM  Result Value Ref Range   Specimen Description BLOOD RIGHT FOREARM    Special Requests      BOTTLES DRAWN AEROBIC AND ANAEROBIC Blood Culture adequate volume   Culture      NO GROWTH 2 DAYS Performed at [facility], [address], [place], [place] [address]    Report Status PENDING   SARS Coronavirus 2 (CEPHEID - Performed in [place] hospital lab), Hosp Order     Status: None   Collection Time: 10/10/18  9:20 PM   Specimen: Nasopharyngeal Swab  Result Value Ref Range   SARS Coronavirus 2 NEGATIVE NEGATIVE    Comment: (NOTE) If result is NEGATIVE SARS-CoV-2 target nucleic acids are NOT DETECTED. The SARS-CoV-2 RNA is generally detectable in upper and lower  respiratory specimens during the acute phase of infection. The lowest  concentration of SARS-CoV-2 viral copies this assay can detect is 250  copies / mL. A negative result does not preclude SARS-CoV-2 infection  and should not be used as the sole basis for treatment or other  patient management decisions.  A negative result may occur with  improper specimen collection / handling, submission of specimen other  than nasopharyngeal swab, presence of viral mutation(s) within the  areas targeted by this assay, and inadequate number of viral copies  (<250 copies / mL). A negative result must be combined with clinical  observations, patient history, and epidemiological information. If result is POSITIVE SARS-CoV-2 target nucleic acids are DETECTED. The SARS-CoV-2 RNA is generally detectable in upper and lower  respiratory specimens dur ing the acute phase of infection.  Positive  results are  indicative of active infection with SARS-CoV-2.  Clinical  correlation with patient history and other diagnostic information is  necessary to determine patient infection status.  Positive results do  not rule out bacterial infection or co-infection with other viruses. If result is PRESUMPTIVE POSTIVE SARS-CoV-2 nucleic acids MAY BE PRESENT.   A presumptive positive result was obtained on the submitted specimen  and confirmed on repeat testing.  While 2019 novel coronavirus  (SARS-CoV-2) nucleic acids may be present in the submitted sample  additional confirmatory testing may be necessary for epidemiological  and / or clinical management purposes  to differentiate between  SARS-CoV-2 and other Sarbecovirus currently known to infect humans.  If clinically indicated additional testing with an alternate test  methodology [phone]) is advised. The SARS-CoV-2 RNA is generally  detectable in upper and lower respiratory sp ecimens during the acute  phase of infection. The expected result is Negative. Fact Sheet for Patients:  StrictlyIdeas.no Fact Sheet for Healthcare Providers: BankingDealers.co.za This test is not yet approved or cleared by the [place] FDA and has been authorized for detection and/or diagnosis of SARS-CoV-2 by FDA under an Emergency Use Authorization (EUA).  This EUA will remain in effect (meaning this test can be used) for the duration of the COVID-19 declaration under Section 564(b)(1) of the Act, 21 U.S.C. section 360bbb-3(b)(1), unless the authorization is terminated or revoked sooner. Performed at [facility], [address], [place], [place] [address]   Lactic acid, plasma     Status: None   Collection Time: 10/10/18 11:02 PM  Result Value Ref Range   Lactic Acid, Venous 1.2 0.5 - 1.9 mmol/L    Comment: Performed at [facility], [address],  [place], [place] [address]  Basic metabolic panel     Status: Abnormal    Collection Time: 10/11/18  5:15 AM  Result Value Ref Range   Sodium 148 (H) 135 - 145 mmol/L   Potassium 4.4 3.5 - 5.1 mmol/L   Chloride 122 (H) 98 - 111 mmol/L   CO2 21 (L) 22 - 32 mmol/L   Glucose, Bld 136 (H) 70 - 99 mg/dL   BUN 91 (H) 8 - 23 mg/dL   Creatinine, Ser 2.32 (H) 0.44 - 1.00 mg/dL   Calcium 9.3 8.9 - 10.3 mg/dL   GFR calc non Af Amer 20 (L) >60 mL/min   GFR calc Af Amer 23 (L) >60 mL/min   Anion gap 5 5 - 15    Comment: Performed at [facility], [address], [place], [place] [address]  TSH     Status: None   Collection Time: 10/12/18  6:24 AM  Result Value Ref Range   TSH 1.143 0.350 - 4.500 uIU/mL    Comment: Performed by a 3rd Generation assay with a functional sensitivity of <=0.01 uIU/mL. Performed at [facility], [address], [place], [place] [address]   Vitamin B12     Status: None   Collection Time: 10/12/18  6:24 AM  Result Value Ref Range   Vitamin B-12 511 180 - 914 pg/mL    Comment: (NOTE) This assay is not validated for testing neonatal or myeloproliferative syndrome specimens for Vitamin B12 levels. Performed at [facility], [address], [place], [place] [address]   Folate     Status: None   Collection Time: 10/12/18  6:24 AM  Result Value Ref Range   Folate 9.1 >5.9 ng/mL    Comment: Performed at [facility], [address] [place], [place] [address]  Basic metabolic panel     Status: Abnormal   Collection Time: 10/12/18  6:24 AM  Result Value Ref Range   Sodium 138 135 - 145 mmol/L    Comment: DELTA CHECK NOTED   Potassium 4.0 3.5 - 5.1 mmol/L   Chloride 110 98 - 111 mmol/L   CO2 20 (L) 22 - 32 mmol/L   Glucose, Bld 117 (H) 70 - 99 mg/dL   BUN 73 (H) 8 - 23 mg/dL   Creatinine, Ser 1.86 (H) 0.44 - 1.00 mg/dL   Calcium 9.1 8.9 - 10.3 mg/dL   GFR calc non Af Amer 26 (L) >60 mL/min   GFR calc Af Amer 30 (L) >60 mL/min   Anion gap 8 5 - 15    Comment: Performed at [facility], [address] [place], [place] [address]   Magnesium     Status: None   Collection Time: 10/12/18  6:24 AM  Result Value Ref Range   Magnesium 2.1 1.7 - 2.4 mg/dL    Comment: Performed at [facility], [address], [place], [place] [address]    Dg Chest Portable 1 View  Result Date: 10/10/2018 CLINICAL DATA:  Pt with decreased appetite and weakness. Pt had recent stroke, so best image possible due to rigidity. Hx CHF, HTNWeakness EXAM: PORTABLE CHEST 1 VIEW COMPARISON:  Radiograph 03/29/2018 FINDINGS: Normal cardiac silhouette. Low lung volumes. No effusion, infiltrate or pneumothorax. Anterior cervical fusion. IMPRESSION: No acute cardiopulmonary process. Electronically Signed   By: Suzy Bouchard M.D.   On: 10/10/2018 20:56    ROS:  Review of systems not obtained due to patient factors.  Blood pressure 99/78, pulse 78, temperature 98.3 F (36.8 C), temperature  source Oral, resp. rate 15, height 5\' 5"  (1.651 m), weight 54 kg, SpO2 96 %. Physical Exam: Comfortable black female no acute distress Lungs clear to auscultation with good breath sounds bilaterally Heart examination reveals regular rate and rhythm Abdomen is soft with and lower midline surgical scar present.  Hospitalist, palliative care notes reviewed  Assessment/Plan: Impression: Failure to thrive, malnutrition Plan: I discussed the procedure with the patient's husband.  He would like to proceed with gastrostomy tube placement.  The risks and benefits of the procedure including bleeding, infection, and organ injury were fully explained to the patient's husband, who gave informed consent.  Aviva Signs 10/12/2018, 8:29 AM

## 2018-10-12 NOTE — Evaluation (Signed)
Physical Therapy Evaluation Patient Details Name: Brandy [name] MRN: [id number] DOB: Mar 04, 1941 Today's Date: 10/12/2018   History of Present Illness  Brandy [name] is a 78 y.o. female with medical history significant of hypertensive heart disease, dilated cardiomyopathy, chronic combined systolic and diastolic CHF, colon polyps, diverticulosis, hypertension, osteopenia, paroxysmal A. fib, history of CVA with residual left-sided hemiparesis/hemiplegia, history of 50-pack-year tobacco use who is coming to the emergency department due to dehydration after he saw the patient's electrolytes and renal function.  Recently, there have been some adjustments to her diuretic medication.    Clinical Impression  Patient functioning at baseline for functional mobility which is non-ambulatory, total assist for transfers and non-functional LUE/BLE.  Plan:  Patient discharged from physical therapy to care of nursing for turning side to side every 2 hours daily to avoid development or worsening of pressure sores and out of bed to chair with use of mechanical lift as tolerated.    Follow Up Recommendations Supervision for mobility/OOB;Supervision/Assistance - 24 hour;No PT follow up;Other (comment)(long term care)    Equipment Recommendations  None recommended by PT    Recommendations for Other Services       Precautions / Restrictions Precautions Precautions: Fall Restrictions Weight Bearing Restrictions: No      Mobility  Bed Mobility Overal bed mobility: Needs Assistance Bed Mobility: Supine to Sit;Sit to Supine     Supine to sit: Total assist;Max assist Sit to supine: Total assist   General bed mobility comments: able to use RUE to help pull self up to sitting  Transfers Overall transfer level: Needs assistance               General transfer comment: unable to stand  Ambulation/Gait                Stairs            Wheelchair Mobility    Modified Rankin  (Stroke Patients Only)       Balance Overall balance assessment: Needs assistance Sitting-balance support: Feet supported;Single extremity supported Sitting balance-Leahy Scale: Poor Sitting balance - Comments: able to use RUE to prop self up for up to 1-2 minutes before falling backwards Postural control: Right lateral lean                                   Pertinent Vitals/Pain Pain Assessment: No/denies pain    Home Living Family/patient expects to be discharged to:: Private residence Living Arrangements: Spouse/significant other Available Help at Discharge: Family;Available 24 hours/day Type of Home: House Home Access: Stairs to enter Entrance Stairs-Rails: None Entrance Stairs-Number of Steps: 1 Home Layout: Two level;Able to live on main level with bedroom/bathroom Home Equipment: Shower seat;Grab bars - tub/shower;Wheelchair - Education administrator (comment) Additional Comments: has hoyar lift at home    Prior Function Level of Independence: Needs assistance   Gait / Transfers Assistance Needed: mechanical lift transfers to wheelchair  ADL's / Homemaking Assistance Needed: assisted by famil        Hand Dominance   Dominant Hand: Right    Extremity/Trunk Assessment   Upper Extremity Assessment Upper Extremity Assessment: LUE deficits/detail;RUE deficits/detail RUE Deficits / Details: grossly 3+/5 LUE Deficits / Details: 0/5 in splint brace    Lower Extremity Assessment Lower Extremity Assessment: Generalized weakness;RLE deficits/detail;LLE deficits/detail RLE Deficits / Details: grossly 1/5, unable to flex knee past 30 degrees due to stiffness LLE Deficits / Details: 0/5  Cervical / Trunk Assessment Cervical / Trunk Assessment: Kyphotic  Communication   Communication: Expressive difficulties  Cognition Arousal/Alertness: Awake/alert Behavior During Therapy: WFL for tasks assessed/performed Overall Cognitive Status: Within Functional Limits  for tasks assessed                                        General Comments      Exercises     Assessment/Plan    PT Assessment Patent does not need any further PT services  PT Problem List         PT Treatment Interventions      PT Goals (Current goals can be found in the Care Plan section)  Acute Rehab PT Goals Patient Stated Goal: return home PT Goal Formulation: With patient Time For Goal Achievement: 10/12/18 Potential to Achieve Goals: Good    Frequency     Barriers to discharge        Co-evaluation               AM-PAC PT "6 Clicks" Mobility  Outcome Measure Help needed turning from your back to your side while in a flat bed without using bedrails?: A Lot Help needed moving from lying on your back to sitting on the side of a flat bed without using bedrails?: A Lot Help needed moving to and from a bed to a chair (including a wheelchair)?: Total Help needed standing up from a chair using your arms (e.g., wheelchair or bedside chair)?: Total Help needed to walk in hospital room?: Total Help needed climbing 3-5 steps with a railing? : Total 6 Click Score: 8    End of Session   Activity Tolerance: Patient tolerated treatment well;Patient limited by fatigue Patient left: in bed;with call [place]/phone within reach Nurse Communication: Mobility status PT Visit Diagnosis: Unsteadiness on feet (R26.81);Other symptoms and signs involving the nervous system (R29.898);Hemiplegia and hemiparesis Hemiplegia - Right/Left: Left Hemiplegia - dominant/non-dominant: Non-dominant Hemiplegia - caused by: Cerebral infarction    Time: 5284-1324 PT Time Calculation (min) (ACUTE ONLY): 21 min   Charges:   PT Evaluation $PT Eval Moderate Complexity: 1 Mod PT Treatments $Therapeutic Activity: 8-22 mins        2:05 PM, 10/12/18 Lonell Grandchild, MPT Physical Therapist with [facility] [phone] office [phone] mobile phone

## 2018-10-12 NOTE — Evaluation (Signed)
Clinical/Bedside Swallow Evaluation Patient Details  Name: Brandy [name] MRN: [id number] Date of Birth: June 19, 1940  Today's Date: 10/12/2018 Time: SLP Start Time (ACUTE ONLY): 1300 SLP Stop Time (ACUTE ONLY): 1326 SLP Time Calculation (min) (ACUTE ONLY): 26 min  Past Medical History:  Past Medical History:  Diagnosis Date  . Cardiomyopathy    a. EF of 25% in 07/2006 b. EF normalized by repeat echo in 2011 c. EF 35-40% by echo in 05/2017 with cath showing mild nonobstructive CAD  . Chronic combined systolic and diastolic CHF (congestive heart failure) ([place])   . Colon polyps   . Diverticulosis of colon   . Hypertension   . Hypertensive heart disease   . Osteopenia   . Paroxysmal atrial fibrillation ([place]) 10/10/2018  . Stroke ([place])   . TIA (transient ischemic attack)   . Tobacco abuse    50 pack year   Past Surgical History:  Past Surgical History:  Procedure Laterality Date  . BREAST BIOPSY    . CARPAL TUNNEL RELEASE     right hand  . CERVICAL FUSION    . CESAREAN SECTION     2 times  . COLONOSCOPY  1287   pt uncertain as to whether polypectomy required or performed  . ECTOPIC PREGNANCY SURGERY    . IR ANGIO VERTEBRAL SEL SUBCLAVIAN INNOMINATE UNI R MOD SED  02/10/2018  . IR CT HEAD LTD  02/10/2018  . IR PERCUTANEOUS ART THROMBECTOMY/INFUSION INTRACRANIAL INC DIAG ANGIO  02/10/2018  . RADIOLOGY WITH ANESTHESIA N/A 02/10/2018   Procedure: RADIOLOGY WITH ANESTHESIA;  Surgeon: Luanne Bras, MD;  Location: [facility];  Service: Radiology;  Laterality: N/A;  . RIGHT/LEFT HEART CATH AND CORONARY ANGIOGRAPHY N/A 06/03/2017   Procedure: RIGHT/LEFT HEART CATH AND CORONARY ANGIOGRAPHY;  Surgeon: Jettie Booze, MD;  Location: [facility];  Service: Cardiovascular;  Laterality: N/A;  . TEE WITHOUT CARDIOVERSION N/A 02/13/2018   Procedure: TRANSESOPHAGEAL ECHOCARDIOGRAM (TEE);  Surgeon: Sueanne Margarita, MD;  Location: [facility] ENDOSCOPY;  Service: Cardiovascular;  Laterality:  N/A;  . TONSILLECTOMY     <<MBSS January 2020: Pt presented with a primary oral dysphagia with decreased sensation, decreased bolus cohesion, piecemeal bolusing with solids and liquids.  Pt demonstrated consistent and reliable airway protection with adequate laryngeal vestibule closure, no penetration/aspiration, good pharyngeal clearance.  There were trace residuals of solids that backflowed from below to superior to UES, but the amount was negligible.  There was no difficulty swallowing a 13 mm barium pill when consumed with applesauce.  Recommend continuing a soft diet with thin liquids; meds no longer need to be crushed.>>   HPI:  78 year old female with history of hypertension and hypertensive heart disease, systolic and diastolic CHF with    Assessment / Plan / Recommendation Clinical Impression  Clinical swallow evaluation completed at bedside. Pt presents with moderate oral phase dysphagia characterized by reduced lingual and labial movement on the left side resulting in labial spillage, impaired bolus manipulation and formation, buccal pocketing, and oral residue with solids. Pt benefited from liquid wash to facilitate clearance of oral residue. Pt without overt signs or symptoms of aspiration, however does present with audible swallow with liquids at times, with suspected delay in swallow initiation/premature spillage. Chest x-ray is unremarkable. Clinical swallow assessment appears consistent with MBSS completed in January. Pt reports decreased appetite and nods when asked if she were going to get a feeding tube. She consumed the container of pears, a few bites of tilapia, and self presented straw  sips of Ensure (her verbal request). Recommend D3/mech soft and thin liquids via cup/straw with supervision and assist with meals as needed. PO medications can be presented whole or crushed in puree per Pt preference. No further SLP services indicated at this time.   SLP Visit Diagnosis: Dysphagia,  oropharyngeal phase (R13.12)    Aspiration Risk  Mild aspiration risk    Diet Recommendation Dysphagia 3 (Mech soft);Thin liquid   Liquid Administration via: Straw;Cup Medication Administration: Whole meds with liquid Supervision: Staff to assist with self feeding;Full supervision/cueing for compensatory strategies Compensations: Slow rate;Small sips/bites;Lingual sweep for clearance of pocketing;Follow solids with liquid Postural Changes: Seated upright at 90 degrees;Remain upright for at least 30 minutes after po intake    Other  Recommendations Oral Care Recommendations: Oral care BID;Staff/trained caregiver to provide oral care Other Recommendations: Clarify dietary restrictions   Follow up Recommendations None;24 hour supervision/assistance      Frequency and Duration            Prognosis Prognosis for Safe Diet Advancement: Fair Barriers to Reach Goals: Behavior      Swallow Study   General Date of Onset: 10/10/18 HPI: 78 year old female with history of hypertension and hypertensive heart disease, systolic and diastolic CHF with  Type of Study: Bedside Swallow Evaluation Previous Swallow Assessment: MBSS 03/2018 D3/thin Diet Prior to this Study: Regular;Thin liquids Temperature Spikes Noted: No Respiratory Status: Room air History of Recent Intubation: No Behavior/Cognition: Alert;Cooperative;Requires cueing Oral Cavity Assessment: Within Functional Limits Oral Care Completed by SLP: No Oral Cavity - Dentition: Adequate natural dentition Vision: Functional for self-feeding Self-Feeding Abilities: Needs assist Patient Positioning: Upright in bed Baseline Vocal Quality: Normal;Low vocal intensity Volitional Cough: Weak Volitional Swallow: Able to elicit    Oral/Motor/Sensory Function Overall Oral Motor/Sensory Function: Moderate impairment Facial ROM: Reduced left;Suspected CN VII (facial) dysfunction Facial Symmetry: Abnormal symmetry left;Suspected CN VII  (facial) dysfunction Facial Strength: Reduced left;Suspected CN VII (facial) dysfunction Lingual ROM: Reduced left Lingual Symmetry: Abnormal symmetry left Lingual Strength: Reduced Velum: Within Functional Limits Mandible: Within Functional Limits   Ice Chips Ice chips: Within functional limits Presentation: Spoon   Thin Liquid Thin Liquid: Impaired Presentation: Straw;Self Fed Pharyngeal  Phase Impairments: (audible swallows)    Nectar Thick Nectar Thick Liquid: Not tested   Honey Thick Honey Thick Liquid: Not tested   Puree Puree: Within functional limits Presentation: Spoon   Solid     Solid: Impaired Presentation: Spoon Oral Phase Impairments: Reduced lingual movement/coordination Oral Phase Functional Implications: Left anterior spillage;Left lateral sulci pocketing;Prolonged oral transit;Oral residue     Thank you,  Genene Churn, [place]  Alvino Lechuga 10/12/2018,1:43 PM

## 2018-10-13 ENCOUNTER — Inpatient Hospital Stay (HOSPITAL_COMMUNITY): Payer: Medicare Other

## 2018-10-13 ENCOUNTER — Inpatient Hospital Stay (HOSPITAL_COMMUNITY): Payer: Medicare Other | Admitting: Anesthesiology

## 2018-10-13 ENCOUNTER — Encounter (HOSPITAL_COMMUNITY)
Admission: EM | Disposition: A | Discharge status: Home Health | Payer: Self-pay | Source: Home / Self Care | Attending: Internal Medicine

## 2018-10-13 DIAGNOSIS — I10 Essential (primary) hypertension: Secondary | ICD-10-CM

## 2018-10-13 HISTORY — PX: ESOPHAGOGASTRODUODENOSCOPY (EGD) WITH PROPOFOL: SHX5813

## 2018-10-13 HISTORY — PX: PEG PLACEMENT: SHX5437

## 2018-10-13 LAB — BASIC METABOLIC PANEL
Anion gap: 7 (ref 5–15)
BUN: 61 mg/dL — ABNORMAL HIGH (ref 8–23)
CO2: 19 mmol/L — ABNORMAL LOW (ref 22–32)
Calcium: 8.5 mg/dL — ABNORMAL LOW (ref 8.9–10.3)
Chloride: 105 mmol/L (ref 98–111)
Creatinine, Ser: 1.66 mg/dL — ABNORMAL HIGH (ref 0.44–1.00)
GFR calc Af Amer: 34 mL/min — ABNORMAL LOW (ref >60)
GFR calc non Af Amer: 30 mL/min — ABNORMAL LOW (ref >60)
Glucose, Bld: 89 mg/dL (ref 70–99)
Potassium: 4 mmol/L (ref 3.5–5.1)
Sodium: 131 mmol/L — ABNORMAL LOW (ref 135–145)

## 2018-10-13 LAB — SURGICAL PCR SCREEN
MRSA, PCR: NEGATIVE
Staphylococcus aureus: NEGATIVE

## 2018-10-13 LAB — MAGNESIUM: Magnesium: 2 mg/dL (ref 1.7–2.4)

## 2018-10-13 SURGERY — INSERTION, PEG TUBE
Anesthesia: General | Site: Esophagus

## 2018-10-13 MED ORDER — FENTANYL CITRATE (PF) 100 MCG/2ML IJ SOLN
INTRAMUSCULAR | Status: DC | PRN
  Administered 2018-10-13: 25 ug via INTRAVENOUS

## 2018-10-13 MED ORDER — FENTANYL CITRATE (PF) 100 MCG/2ML IJ SOLN: 25.0000 ug | INTRAMUSCULAR | Status: DC | PRN

## 2018-10-13 MED ORDER — EPHEDRINE SULFATE 50 MG/ML IJ SOLN
INTRAMUSCULAR | Status: DC | PRN
  Administered 2018-10-13 (×2): 10 mg via INTRAVENOUS

## 2018-10-13 MED ORDER — WATER FOR IRRIGATION, STERILE IR SOLN
Status: DC | PRN
  Administered 2018-10-13: 1000 mL

## 2018-10-13 MED ORDER — FENTANYL CITRATE (PF) 100 MCG/2ML IJ SOLN
INTRAMUSCULAR | Status: AC
  Filled 2018-10-13: qty 2

## 2018-10-13 MED ORDER — LACTATED RINGERS IV SOLN
INTRAVENOUS | Status: DC
  Administered 2018-10-13: 11:00:00 via INTRAVENOUS

## 2018-10-13 MED ORDER — CHLORHEXIDINE GLUCONATE CLOTH 2 % EX PADS
6.0000 | MEDICATED_PAD | Freq: Once | CUTANEOUS | Status: AC
  Administered 2018-10-13: 6 via TOPICAL

## 2018-10-13 MED ORDER — PROPOFOL 10 MG/ML IV BOLUS
INTRAVENOUS | Status: DC | PRN
  Administered 2018-10-13: 30 mg via INTRAVENOUS

## 2018-10-13 MED ORDER — EPHEDRINE 5 MG/ML INJ
INTRAVENOUS | Status: AC
  Filled 2018-10-13: qty 10

## 2018-10-13 MED ORDER — PHENYLEPHRINE HCL (PRESSORS) 10 MG/ML IV SOLN
INTRAVENOUS | Status: DC | PRN
  Administered 2018-10-13: 80 ug via INTRAVENOUS

## 2018-10-13 MED ORDER — LIDOCAINE HCL 1 % IJ SOLN
INTRAMUSCULAR | Status: DC | PRN
  Administered 2018-10-13: 5 mL

## 2018-10-13 MED ORDER — KETAMINE HCL 50 MG/5ML IJ SOSY
PREFILLED_SYRINGE | INTRAMUSCULAR | Status: AC
  Filled 2018-10-13: qty 5

## 2018-10-13 MED ORDER — CHLORHEXIDINE GLUCONATE CLOTH 2 % EX PADS: 6.0000 | MEDICATED_PAD | Freq: Once | CUTANEOUS | Status: DC

## 2018-10-13 MED ORDER — CEFAZOLIN SODIUM-DEXTROSE 2-4 GM/100ML-% IV SOLN
2.0000 g | INTRAVENOUS | Status: AC
  Administered 2018-10-13: 2 g via INTRAVENOUS

## 2018-10-13 MED ORDER — MUPIROCIN 2 % EX OINT: 1.0000 "application " | TOPICAL_OINTMENT | Freq: Two times a day (BID) | CUTANEOUS | Status: DC

## 2018-10-13 MED ORDER — PROPOFOL 10 MG/ML IV BOLUS
INTRAVENOUS | Status: AC
  Filled 2018-10-13: qty 20

## 2018-10-13 MED ORDER — CEFAZOLIN SODIUM-DEXTROSE 2-4 GM/100ML-% IV SOLN
INTRAVENOUS | Status: AC
  Filled 2018-10-13: qty 100

## 2018-10-13 MED ORDER — SODIUM CHLORIDE 0.9 % IV SOLN
INTRAVENOUS | Status: AC
  Administered 2018-10-13: 18:00:00 via INTRAVENOUS

## 2018-10-13 MED ORDER — PHENYLEPHRINE 40 MCG/ML (10ML) SYRINGE FOR IV PUSH (FOR BLOOD PRESSURE SUPPORT)
PREFILLED_SYRINGE | INTRAVENOUS | Status: AC
  Filled 2018-10-13: qty 10

## 2018-10-13 SURGICAL SUPPLY — 12 items
BINDER ABDOMINAL  9 SM 30-45 (SOFTGOODS) ×2
BINDER ABDOMINAL 9 SM 30-45 (SOFTGOODS) IMPLANT
GLOVE BIOGEL PI IND STRL 6.5 (GLOVE) ×1 IMPLANT
GLOVE BIOGEL PI IND STRL 7.0 (GLOVE) ×1 IMPLANT
GLOVE BIOGEL PI INDICATOR 6.5 (GLOVE) ×2
GLOVE BIOGEL PI INDICATOR 7.0 (GLOVE) ×2
GLOVE SS BIOGEL STRL SZ 7.5 (GLOVE) ×1 IMPLANT
GLOVE SUPERSENSE BIOGEL SZ 7.5 (GLOVE) ×2
KIT PEG SAFETY 20FR (KITS) ×3 IMPLANT
KIT TURNOVER KIT A (KITS) ×3 IMPLANT
MANIFOLD NEPTUNE II (INSTRUMENTS) ×3 IMPLANT
SPONGE DRAIN TRACH 4X4 STRL 2S (GAUZE/BANDAGES/DRESSINGS) ×2 IMPLANT

## 2018-10-13 NOTE — Progress Notes (Signed)
Physical Therapy Treatment Patient Details Name: Brandy [name] MRN: [id number] DOB: Dec 20, 1940 Today's Date: 10/13/2018    History of Present Illness Brandy [name] is a 78 y.o. female with medical history significant of hypertensive heart disease, dilated cardiomyopathy, chronic combined systolic and diastolic CHF, colon polyps, diverticulosis, hypertension, osteopenia, paroxysmal A. fib, history of CVA with residual left-sided hemiparesis/hemiplegia, history of 50-pack-year tobacco use who is coming to the emergency department due to dehydration after he saw the patient's electrolytes and renal function.  Recently, there have been some adjustments to her diuretic medication.    PT Comments    REASSESSMENT:  Patient unable to maintain sitting balance seated at bedside, unable to maintain standing balance due to weakness requiring Total assist partial stand pivot to transfer to chair with mechanical lift harness in place.  RN and NT informed that patient is total assist at baseline and to use mechanical lift when transferring patient back to bed.  Plan:  Patient discharged from physical therapy to care of nursing for OOB daily as tolerated for length of stay.   Follow Up Recommendations  Supervision for mobility/OOB;Supervision/Assistance - 24 hour;No PT follow up;Other (comment)     Equipment Recommendations  None recommended by PT    Recommendations for Other Services       Precautions / Restrictions Precautions Precautions: Fall Restrictions Weight Bearing Restrictions: No    Mobility  Bed Mobility Overal bed mobility: Needs Assistance Bed Mobility: Supine to Sit     Supine to sit: Max assist;Total assist     General bed mobility comments: Patient limited to use of RUE only for sitting up requiring Max/total assist to sit up at bedside  Transfers Overall transfer level: Needs assistance   Transfers: Sit to/from Stand;Stand Pivot Transfers Sit to Stand: Total  assist Stand pivot transfers: Total assist       General transfer comment: Patient unable to support body weight on BLE due to weakness  Ambulation/Gait                 Stairs             Wheelchair Mobility    Modified Rankin (Stroke Patients Only)       Balance Overall balance assessment: Needs assistance Sitting-balance support: Feet supported;Single extremity supported Sitting balance-Leahy Scale: Poor Sitting balance - Comments: frequent falling forward and to the left while seated at bedside Postural control: Left lateral lean                                  Cognition Arousal/Alertness: Awake/alert Behavior During Therapy: WFL for tasks assessed/performed Overall Cognitive Status: Within Functional Limits for tasks assessed                                        Exercises      General Comments        Pertinent Vitals/Pain Pain Assessment: No/denies pain    Home Living                      Prior Function            PT Goals (current goals can now be found in the care plan section) Acute Rehab PT Goals Patient Stated Goal: return home PT Goal Formulation: With patient Time For Goal Achievement: 10/13/18  Potential to Achieve Goals: Good Progress towards PT goals: Not progressing toward goals - comment(Patient at baseline)    Frequency           PT Plan Current plan remains appropriate    Co-evaluation              AM-PAC PT "6 Clicks" Mobility   Outcome Measure  Help needed turning from your back to your side while in a flat bed without using bedrails?: A Lot Help needed moving from lying on your back to sitting on the side of a flat bed without using bedrails?: A Lot Help needed moving to and from a bed to a chair (including a wheelchair)?: Total Help needed standing up from a chair using your arms (e.g., wheelchair or bedside chair)?: Total Help needed to walk in hospital  room?: Total Help needed climbing 3-5 steps with a railing? : Total 6 Click Score: 8    End of Session   Activity Tolerance: Patient limited by fatigue Patient left: in chair;with call Rasor/phone within reach Nurse Communication: Mobility status PT Visit Diagnosis: Unsteadiness on feet (R26.81);Other symptoms and signs involving the nervous system (R29.898);Hemiplegia and hemiparesis Hemiplegia - Right/Left: Left Hemiplegia - dominant/non-dominant: Non-dominant Hemiplegia - caused by: Cerebral infarction     Time: 7169-6789 PT Time Calculation (min) (ACUTE ONLY): 31 min  Charges:  $Therapeutic Activity: 23-37 mins                     2:03 PM, 10/13/18 Lonell Grandchild, MPT Physical Therapist with [facility] 336 [phone] office [phone] mobile phone

## 2018-10-13 NOTE — Progress Notes (Signed)
Pt did not want her jewelry back on. This RN gave the pt's jewelry which are 3 rings in a zip lock bag to Nancy,RN [address].

## 2018-10-13 NOTE — Transfer of Care (Signed)
Immediate Anesthesia Transfer of Care Note  Patient: Brandy [name]  Procedure(s) Performed: PERCUTANEOUS ENDOSCOPIC GASTROSTOMY (PEG) PLACEMENT (N/A Esophagus) ESOPHAGOGASTRODUODENOSCOPY (EGD) WITH PROPOFOL (N/A Esophagus)  Patient Location: PACU  Anesthesia Type:General  Level of Consciousness: sedated  Airway & Oxygen Therapy: Patient Spontanous Breathing and Patient connected to nasal cannula oxygen  Post-op Assessment: Report given to RN  Post vital signs: Reviewed and stable  Last Vitals:  Vitals Value Taken Time  BP 91/63 10/13/18 1217  Temp    Pulse 79 10/13/18 1221  Resp 11 10/13/18 1221  SpO2 98 % 10/13/18 1221  Vitals shown include unvalidated device data.  Last Pain:  Vitals:   10/13/18 1120  TempSrc:   PainSc: 0-No pain         Complications: No apparent anesthesia complications

## 2018-10-13 NOTE — Anesthesia Postprocedure Evaluation (Signed)
Anesthesia Post Note Late Entry for 2574  Patient: Brandy [name]  Procedure(s) Performed: PERCUTANEOUS ENDOSCOPIC GASTROSTOMY (PEG) PLACEMENT (N/A Esophagus) ESOPHAGOGASTRODUODENOSCOPY (EGD) WITH PROPOFOL (N/A Esophagus)  Patient location during evaluation: PACU Anesthesia Type: General Level of consciousness: awake and alert and oriented Pain management: pain level controlled Vital Signs Assessment: post-procedure vital signs reviewed and stable Respiratory status: spontaneous breathing Cardiovascular status: stable Postop Assessment: no apparent nausea or vomiting Anesthetic complications: no     Last Vitals:  Vitals:   10/13/18 1245 10/13/18 1258  BP: 106/61 97/66  Pulse: (!) 58 (!) 57  Resp: 12 11  Temp: 36.6 C 36.5 C  SpO2: 100% 100%    Last Pain:  Vitals:   10/13/18 1258  TempSrc:   PainSc: 0-No pain                 ADAMS, AMY A

## 2018-10-13 NOTE — TOC Initial Note (Signed)
Transition of Care [facility]) - Initial/Assessment Note    Patient Details  Name: Brandy [name] MRN: [id number] Date of Birth: Oct 02, 1940  [facility] CM/SW Contact:    Ihor Gully, LCSW Phone Number: 10/13/2018, 4:16 PM  Clinical Narrative:                 PEG tube placed. Patient from home with spouse and supports. Patient is uses a lift for transfers. She requires total care. Family is not interested in long term care and prefer for patient to remain in the home.   Referrals for DME related to tube feedings were provided to choice and referral for RN was provided to choice.     Expected Discharge Plan: [place] Barriers to Discharge: No Barriers Identified   Patient Goals and CMS Choice Patient states their goals for this hospitalization and ongoing recovery are:: Family wants her to return home to their care. CMS Medicare.gov Compare Post Acute Care list provided to:: Patient Represenative (must comment)(Spouse.) Choice offered to / list presented to : Spouse  Expected Discharge Plan and Services Expected Discharge Plan: [place] Acute Care Choice: Home Health, Durable Medical Equipment Living arrangements for the past 2 months: [place]                   DME Agency: [employer] Date DME Agency Contacted: 10/13/18 Time DME Agency Contacted: [phone] Representative spoke with at DME Agency: Blake Divine HH Arranged: RN [place] Agency: [facility] Date [place]: 10/13/18 Time [place]: 52 Representative spoke with at [place]: Myriam Jacobson  Prior Living Arrangements/Services Living arrangements for the past 2 months: [place] Lives with:: Spouse Patient language and need for interpreter reviewed:: Yes Do you feel safe going back to the place where you live?: Yes      Need for Family Participation in Patient Care: Yes (Comment) Care giver support system in place?: Yes  (comment) Current home services: DME, Other (comment)(Private duty aide) Criminal Activity/Legal Involvement Pertinent to Current Situation/Hospitalization: No - Comment as needed  Activities of Daily Living Home Assistive Devices/Equipment: Brace (specify type) ADL Screening (condition at time of admission) Patient's cognitive ability adequate to safely complete daily activities?: No Is the patient deaf or have difficulty hearing?: No Does the patient have difficulty seeing, even when wearing glasses/contacts?: No Does the patient have difficulty concentrating, remembering, or making decisions?: No Patient able to express need for assistance with ADLs?: Yes Does the patient have difficulty dressing or bathing?: Yes Independently performs ADLs?: No Communication: Dependent Is this a change from baseline?: Pre-admission baseline Dressing (OT): Dependent Is this a change from baseline?: Pre-admission baseline Grooming: Dependent Is this a change from baseline?: Pre-admission baseline Feeding: Dependent Is this a change from baseline?: Pre-admission baseline Bathing: Dependent Is this a change from baseline?: Pre-admission baseline Toileting: Dependent Is this a change from baseline?: Pre-admission baseline In/Out Bed: Dependent Is this a change from baseline?: Pre-admission baseline Walks in Home: Dependent Is this a change from baseline?: Pre-admission baseline Does the patient have difficulty walking or climbing stairs?: Yes Weakness of Legs: Both Weakness of Arms/Hands: Both  Permission Sought/Granted Permission sought to share information with : Family Supports, PCP                Emotional Assessment Appearance:: Appears stated age   Affect (typically observed): Unable to Assess Orientation: : Oriented to Self,  Oriented to Place, Oriented to Situation, Oriented to  Time Alcohol / Substance Use: Not Applicable Psych Involvement: No (comment)  Admission diagnosis:   Lactic acidosis [E87.2] Acute renal failure, unspecified acute renal failure type [facility]) [N17.9] Patient Active Problem List   Diagnosis Date Noted  . Protein-calorie malnutrition, severe 10/12/2018  . Failure to thrive in adult 10/11/2018  . Acute renal failure superimposed on stage 3 chronic kidney disease ([place]) 10/11/2018  . Palliative care by specialist   . Acute prerenal azotemia 10/10/2018  . Hypernatremia 10/10/2018  . AKI (acute kidney injury) ([place]) 10/10/2018  . Paroxysmal atrial fibrillation ([place]) 10/10/2018  . Abnormality of gait 05/04/2018  . Acute exacerbation of congestive heart failure ([place]) 03/28/2018  . Left hemiplegia ([place])   . Chronic kidney disease   . Hypokalemia   . Acute on chronic anemia   . Hypoalbuminemia due to protein-calorie malnutrition ([place])   . Hemiparesis affecting left side as late effect of stroke ([place]) 02/16/2018  . Left-sided neglect 02/16/2018  . Right middle cerebral artery stroke ([place]) 02/16/2018  . Acute embolic stroke ([place])   . Acute right MCA stroke ([place])   . History of CVA with residual deficit   . Benign essential HTN   . Chronic combined systolic and diastolic congestive heart failure ([place])   . History of fusion of cervical spine   . Stage 3 chronic kidney disease ([place])   . Acute blood loss anemia   . Stroke ([place]) 02/10/2018  . Middle cerebral artery embolism, right 02/10/2018  . Goals of care, counseling/discussion 11/01/2017  . Non-ischemic cardiomyopathy ([place]) 09/14/2017  . Precordial chest pain 09/14/2017  . B12 deficiency 09/13/2017  . Tachycardia 09/13/2017  . Acute ischemic stroke ([place])   . Seasonal allergies 07/13/2017  . TIA (transient ischemic attack) 07/12/2017  . Anemia 07/12/2017  . Pneumonia 06/02/2017  . Pleural effusion 06/02/2017  . Acute on chronic systolic congestive heart failure ([place])   . Elevated troponin   . Other fatigue 05/04/2017  . Dyspnea 05/04/2017  . Pseudophakia of both eyes 08/06/2016  .  Chronic combined systolic and diastolic CHF (congestive heart failure) ([place]) 07/09/2016  . Hypertensive heart disease   . Hypertension   . Nuclear sclerosis, bilateral 05/24/2016  . HTN (hypertension) 01/22/2013  . TOBACCO ABUSE 02/18/2008  . CARDIOMYOPATHY 02/18/2008  . Diverticulosis of colon 02/18/2008  . OSTEOPENIA 02/18/2008  . Disorder of bone and cartilage 02/18/2008   PCP:  [name], MD Pharmacy:   [place], [place] - [address] [place] [address] Phone: [phone] Fax: [phone]  [facility] [place], [place] [address] [place] [place] [address] Phone: [phone] Fax: [phone]     Social Determinants of Health (SDOH) Interventions    Readmission Risk Interventions No flowsheet data found.

## 2018-10-13 NOTE — Anesthesia Preprocedure Evaluation (Signed)
Anesthesia Evaluation  Patient identified by MRN, date of birth, ID band Patient unresponsive    Reviewed: Allergy & Precautions, H&P , NPO status , Patient's Chart, lab work & pertinent test results, reviewed documented beta blocker date and time , Unable to perform ROS - Chart review only  Airway        Dental   Pulmonary shortness of breath, pneumonia, resolved, former smoker,    Pulmonary exam normal        Cardiovascular hypertension, +CHF  Normal cardiovascular exam     Neuro/Psych TIACVA    GI/Hepatic negative GI ROS, Neg liver ROS,   Endo/Other    Renal/GU Renal disease     Musculoskeletal   Abdominal   Peds  Hematology  (+) Blood dyscrasia, anemia ,   Anesthesia Other Findings UTA airway.  Reproductive/Obstetrics                             Anesthesia Physical Anesthesia Plan  ASA: III  Anesthesia Plan: General   Post-op Pain Management:    Induction:   PONV Risk Score and Plan: 2 and TIVA  Airway Management Planned:   Additional Equipment:   Intra-op Plan:   Post-operative Plan:   Informed Consent: I have reviewed the patients History and Physical, chart, labs and discussed the procedure including the risks, benefits and alternatives for the proposed anesthesia with the patient or authorized representative who has indicated his/her understanding and acceptance.       Plan Discussed with: CRNA  Anesthesia Plan Comments:         Anesthesia Quick Evaluation

## 2018-10-13 NOTE — Care Management Important Message (Signed)
Important Message  Patient Details  Name: Brandy [name] MRN: [id number] Date of Birth: July 08, 1940   Medicare Important Message Given:  Yes(copy was placed at bedside table due to patient being in procedure)     Tommy Medal 10/13/2018, 12:19 PM

## 2018-10-13 NOTE — Interval H&P Note (Signed)
History and Physical Interval Note:  10/13/2018 10:41 AM  Brandy [name]  has presented today for surgery, with the diagnosis of failure to thrive, malnutrition.  The various methods of treatment have been discussed with the patient and family. After consideration of risks, benefits and other options for treatment, the patient has consented to  Procedure(s): PERCUTANEOUS ENDOSCOPIC GASTROSTOMY (PEG) PLACEMENT (N/A) ESOPHAGOGASTRODUODENOSCOPY (EGD) WITH PROPOFOL (N/A) as a surgical intervention.  The patient's history has been reviewed, patient examined, no change in status, stable for surgery.  I have reviewed the patient's chart and labs.  Questions were answered to the patient's satisfaction.     Aviva Signs

## 2018-10-13 NOTE — Progress Notes (Signed)
PROGRESS NOTE  Brandy [name] XTK:[id number] DOB: 01-Jan-1941 DOA: 10/10/2018 PCP: [name], MD  Brief History: 78 year old female with history of hypertension and hypertensive heart disease, systolicanddiastolic CHF with  EF 99%, hypertension, paroxysmal atrial fibrillation, stroke with left hemiparesis, COPD, and hyperlipidemia presenting with "dehydration". Patient was brought into the hospital per his PCP request secondary to symptoms of failure to thrive with poor oral intake and lab suggesting dehydration. The patient has had decreased oral intake for approximately 1 month. At baseline, the patient is nonambulatory and nonverbal but is alert and responsive by nodding. There is not been any history of vomiting, diarrhea, fevers, chills, headache, neck pain. Upon presentation, the patient was afebrile hemodynamically stable albeit with soft blood pressures in the 90s. Sodium was 151 with serum creatinine 2.58. WBC was 9.9. Urinalysis did not show any pyuria. Patient was started on IV fluids. Palliative medicine was consulted to assist with management. Patient's apixaban was held in anticipation for possible gastrostomy tube placement on 10/13/2018.  Assessment/Plan: Acute on chronic renal failure--CKD 3 -Baseline creatinine 0.9-1.1 -Presented with serum creatinine 2.58 -Continue IV fluids--slowly improving but not at baseline -Secondary to dehydration volume depletion  Failure to thrive -Serum M42--683 -Folic MHDQ--2.2 -PT [id number]--41 hour supervision -TSH--1.143 -Continue Megace -discussed with spouse and brother who wish to pursue G-tube -pt has no desire to eat or drink -PEG 10/13/18 -10/13/18--starting enteral feeding--osmolite 1.5 five times daily+ 200 cc free water bid  Hypernatremia -Secondary to volume depletion -Judicious hypotonic fluids-->improved  Chronic combined systolic and diastolic CHF -09/29/812 echo EF 20-25%, grade 2 DD, diffuse  HK, moderate MR, PASP 49 -Clinically volume depleted presently -Daily weights -Continue carvedilol  Stroke with hemiparesis -PT evaluation--24 hour supervision -Continue apixaban and Lipitor  Paroxysmal atrial fibrillation -Currently in sinus rhythm -Holding apixaban in preparation for possible gastrostomy tube placement  Hyperlipidemia -continue statin  Severe Malnutrition -start enteral feeding after G-tube placement 10/13/18  Goals of Care -palliative consult -discussed with brother and spouse--FULL CODE -they wish to pursue G-tube-->consult general surgery     Disposition Plan: Home 7/18 if tolerates enteral feeding Family Communication:Spouse updated 7/17  Consultants:GI, palliative, general surgery  Code Status: FULL  DVT Prophylaxis: SCDs   Procedures: As Listed in Progress Note Above  Antibiotics: None     Subjective: Patient denies fevers, chills, headache, chest pain, dyspnea, nausea, vomiting, diarrhea, abdominal pain, dysuria, hematuria, hematochezia, and melena.   Objective: Vitals:   10/13/18 1230 10/13/18 1245 10/13/18 1258 10/13/18 1329  BP: (!) 106/58 106/61 97/66 (!) 100/58  Pulse: 72 (!) 58 (!) 57 60  Resp: 10 12 11 16   Temp:  97.9 F (36.6 C) 97.7 F (36.5 C) 98.1 F (36.7 C)  TempSrc:      SpO2: 99% 100% 100% 100%  Weight:      Height:        Intake/Output Summary (Last 24 hours) at 10/13/2018 1725 Last data filed at 10/13/2018 1500 Gross per 24 hour  Intake 1644.4 ml  Output 500 ml  Net 1144.4 ml   Weight change:  Exam:   General:  Pt is alert, follows commands appropriately, not in acute distress  HEENT: No icterus, No thrush, No neck mass, [place]/AT  Cardiovascular: RRR, S1/S2, no rubs, no gallops  Respiratory: bibasilar rales, no wheeze  Abdomen: Soft/+BS, non tender, non distended, no guarding  Extremities: No edema, No lymphangitis, No petechiae, No rashes, no synovitis   Data  Reviewed: I  have personally reviewed following labs and imaging studies Basic Metabolic Panel: Recent Labs  Lab 10/10/18 2041 10/11/18 0515 10/12/18 0624 10/13/18 0658  NA 151* 148* 138 131*  K 4.9 4.4 4.0 4.0  CL 122* 122* 110 105  CO2 21* 21* 20* 19*  GLUCOSE 117* 136* 117* 89  BUN 94* 91* 73* 61*  CREATININE 2.58* 2.32* 1.86* 1.66*  CALCIUM 10.0 9.3 9.1 8.5*  MG  --   --  2.1 2.0   Liver Function Tests: Recent Labs  Lab 10/10/18 2041  AST 30  ALT 16  ALKPHOS 54  BILITOT 0.5  PROT 7.1  ALBUMIN 3.8   No results for input(s): LIPASE, AMYLASE in the last 168 hours. No results for input(s): AMMONIA in the last 168 hours. Coagulation Profile: No results for input(s): INR, PROTIME in the last 168 hours. CBC: Recent Labs  Lab 10/10/18 2041  WBC 9.9  NEUTROABS 6.5  HGB 13.3  HCT 44.6  MCV 95.3  PLT 247   Cardiac Enzymes: No results for input(s): CKTOTAL, CKMB, CKMBINDEX, TROPONINI in the last 168 hours. BNP: Invalid input(s): POCBNP CBG: No results for input(s): GLUCAP in the last 168 hours. HbA1C: No results for input(s): HGBA1C in the last 72 hours. Urine analysis:    Component Value Date/Time   COLORURINE YELLOW 10/10/2018 1951   APPEARANCEUR CLEAR 10/10/2018 1951   LABSPEC 1.014 10/10/2018 1951   PHURINE 5.0 10/10/2018 1951   GLUCOSEU NEGATIVE 10/10/2018 1951   HGBUR NEGATIVE 10/10/2018 1951   BILIRUBINUR NEGATIVE 10/10/2018 [place] NEGATIVE 10/10/2018 1951   PROTEINUR NEGATIVE 10/10/2018 1951   NITRITE NEGATIVE 10/10/2018 1951   LEUKOCYTESUR NEGATIVE 10/10/2018 1951   Sepsis Labs: @LABRCNTIP (procalcitonin:4,lacticidven:4) ) Recent Results (from the past 240 hour(s))  Culture, blood (routine x 2)     Status: None (Preliminary result)   Collection Time: 10/10/18  8:41 PM   Specimen: BLOOD LEFT FOREARM  Result Value Ref Range Status   Specimen Description BLOOD LEFT FOREARM  Final   Special Requests   Final    BOTTLES DRAWN AEROBIC  AND ANAEROBIC Blood Culture adequate volume   Culture   Final    NO GROWTH 3 DAYS Performed at [facility], [address], [place], [place] [address]    Report Status PENDING  Incomplete  Culture, blood (routine x 2)     Status: None (Preliminary result)   Collection Time: 10/10/18  8:42 PM   Specimen: BLOOD RIGHT FOREARM  Result Value Ref Range Status   Specimen Description BLOOD RIGHT FOREARM  Final   Special Requests   Final    BOTTLES DRAWN AEROBIC AND ANAEROBIC Blood Culture adequate volume   Culture   Final    NO GROWTH 3 DAYS Performed at [facility], [address] [place], [place] [address]    Report Status PENDING  Incomplete  SARS Coronavirus 2 (CEPHEID - Performed in [place] hospital lab), Hosp Order     Status: None   Collection Time: 10/10/18  9:20 PM   Specimen: Nasopharyngeal Swab  Result Value Ref Range Status   SARS Coronavirus 2 NEGATIVE NEGATIVE Final    Comment: (NOTE) If result is NEGATIVE SARS-CoV-2 target nucleic acids are NOT DETECTED. The SARS-CoV-2 RNA is generally detectable in upper and lower  respiratory specimens during the acute phase of infection. The lowest  concentration of SARS-CoV-2 viral copies this assay can detect is 250  copies / mL. A negative result does not preclude SARS-CoV-2 infection  and  should not be used as the sole basis for treatment or other  patient management decisions.  A negative result may occur with  improper specimen collection / handling, submission of specimen other  than nasopharyngeal swab, presence of viral mutation(s) within the  areas targeted by this assay, and inadequate number of viral copies  (<250 copies / mL). A negative result must be combined with clinical  observations, patient history, and epidemiological information. If result is POSITIVE SARS-CoV-2 target nucleic acids are DETECTED. The SARS-CoV-2 RNA is generally detectable in upper and lower  respiratory specimens dur ing the acute phase of  infection.  Positive  results are indicative of active infection with SARS-CoV-2.  Clinical  correlation with patient history and other diagnostic information is  necessary to determine patient infection status.  Positive results do  not rule out bacterial infection or co-infection with other viruses. If result is PRESUMPTIVE POSTIVE SARS-CoV-2 nucleic acids MAY BE PRESENT.   A presumptive positive result was obtained on the submitted specimen  and confirmed on repeat testing.  While 2019 novel coronavirus  (SARS-CoV-2) nucleic acids may be present in the submitted sample  additional confirmatory testing may be necessary for epidemiological  and / or clinical management purposes  to differentiate between  SARS-CoV-2 and other Sarbecovirus currently known to infect humans.  If clinically indicated additional testing with an alternate test  methodology [phone]) is advised. The SARS-CoV-2 RNA is generally  detectable in upper and lower respiratory sp ecimens during the acute  phase of infection. The expected result is Negative. Fact Sheet for Patients:  StrictlyIdeas.no Fact Sheet for Healthcare Providers: BankingDealers.co.za This test is not yet approved or cleared by the [place] FDA and has been authorized for detection and/or diagnosis of SARS-CoV-2 by FDA under an Emergency Use Authorization (EUA).  This EUA will remain in effect (meaning this test can be used) for the duration of the COVID-19 declaration under Section 564(b)(1) of the Act, 21 U.S.C. section 360bbb-3(b)(1), unless the authorization is terminated or revoked sooner. Performed at [facility], [address], [place], [place] [address]   Surgical PCR screen     Status: None   Collection Time: 10/13/18 10:00 AM   Specimen: Nasal Mucosa; Nasal Swab  Result Value Ref Range Status   MRSA, PCR NEGATIVE NEGATIVE Final   Staphylococcus aureus NEGATIVE NEGATIVE Final     Comment: (NOTE) The Xpert SA Assay (FDA approved for NASAL specimens in patients 69 years of age and older), is one component of a comprehensive surveillance program. It is not intended to diagnose infection nor to guide or monitor treatment. Performed at [facility], [address] [place], [place] [address]      Scheduled Meds: . atorvastatin  5 mg Oral q morning - 10a  . carvedilol  3.125 mg Oral BID  . citalopram  10 mg Oral Daily  . donepezil  5 mg Oral QHS  . feeding supplement (OSMOLITE 1.5 CAL)  120 mL Per Tube 5 X Daily  . isosorbide mononitrate  15 mg Oral q morning - 10a  . megestrol  100 mg Oral BID  . methylphenidate  5 mg Oral BID WC  . multivitamin  15 mL Oral Daily  . umeclidinium-vilanterol  1 puff Inhalation Daily   Continuous Infusions:  Procedures/Studies: Dg Chest Portable 1 View  Result Date: 10/10/2018 CLINICAL DATA:  Pt with decreased appetite and weakness. Pt had recent stroke, so best image possible due to rigidity. Hx CHF, HTNWeakness EXAM: PORTABLE  CHEST 1 VIEW COMPARISON:  Radiograph 03/29/2018 FINDINGS: Normal cardiac silhouette. Low lung volumes. No effusion, infiltrate or pneumothorax. Anterior cervical fusion. IMPRESSION: No acute cardiopulmonary process. Electronically Signed   By: Suzy Bouchard M.D.   On: 10/10/2018 20:56    Orson Eva, DO  [facility] Pager [phone]  If 7PM-7AM, please contact night-coverage [email or web address] Password [email or web address] 10/13/2018, 5:25 PM   LOS: 2 days

## 2018-10-13 NOTE — Progress Notes (Signed)
Completed second bolus feeding of osmolite.  Denies any nausea or pain.  Dressing dry to peg site.  Abd binder in place.

## 2018-10-13 NOTE — Op Note (Signed)
Patient:  Brandy [name]  DOB:  04-15-1940  MRN:  [id number]   Preop Diagnosis: Severe malnutrition, failure to thrive  Postop Diagnosis: Same  Procedure: EGD with PEG  Surgeon: Aviva Signs, MD  Assistant: Curlene Labrum, MD  Anes: MAC  Indications: Patient is a 78 year old black female who presents with multiple medical problems that have resulted in severe protein malnutrition and failure to thrive.  At the request of the family, the patient now presents for an EGD with gastrostomy tube placement.  The risks and benefits of the procedure were explained to both the patient and her husband, who gave informed consent.  Procedure note: The patient was placed in supine position.  After monitored anesthesia care was given, the endoscope was advanced into the second portion of the duodenum without difficulty.  The first and second portions of the duodenum were within normal limits.  The pylorus was noted to be widely patent.  An area in the antrum was palpated transabdominally.  1% Xylocaine was used for local anesthesia.  A needle catheter was advanced into the stomach under direct visualization without difficulty.  A guidewire was then advanced and grasped with the snare.  The guidewire along with the endoscope was then removed.  A 20 French gastrostomy tube was then attached to the guidewire and was placed using the pull technique.  It was at the 3 cm skin level.  A bolster was then placed.  The endoscope was advanced back down into the stomach and confirmation of placement was seen.  All fluid and air were then evacuated from the stomach prior to removal of the endoscope.  Triple antibiotic ointment was placed at the exit site of the gastrostomy tube.  An abdominal binder was then placed.  The patient tolerated the procedure well.  She was transferred back to PACU in stable condition.  Complications: None  EBL: Minimal  Specimen: None

## 2018-10-13 NOTE — Progress Notes (Addendum)
Returned from peg tube placement earlier in afternoon.  Alert and able to answer questions.  Denied being in pain.  Dressing to peg site had scant amount of bloody drainage and new dressing placed.  Abd binder in place.  Reported  2 loose stools ( which occurred before surg and tube feeding) to Dr. Carles Collet and dietician and Dr. Carles Collet gave no new orders.  Gave 120 MLs of osmolite 1.5 and it  flushed through with no difficulty.  HOB at 30 degrees.

## 2018-10-14 LAB — BASIC METABOLIC PANEL
Anion gap: 7 (ref 5–15)
BUN: 50 mg/dL — ABNORMAL HIGH (ref 8–23)
CO2: 19 mmol/L — ABNORMAL LOW (ref 22–32)
Calcium: 8.2 mg/dL — ABNORMAL LOW (ref 8.9–10.3)
Chloride: 108 mmol/L (ref 98–111)
Creatinine, Ser: 1.49 mg/dL — ABNORMAL HIGH (ref 0.44–1.00)
GFR calc Af Amer: 39 mL/min — ABNORMAL LOW (ref >60)
GFR calc non Af Amer: 34 mL/min — ABNORMAL LOW (ref >60)
Glucose, Bld: 136 mg/dL — ABNORMAL HIGH (ref 70–99)
Potassium: 3.4 mmol/L — ABNORMAL LOW (ref 3.5–5.1)
Sodium: 134 mmol/L — ABNORMAL LOW (ref 135–145)

## 2018-10-14 LAB — URIC ACID: Uric Acid, Serum: 8.7 mg/dL — ABNORMAL HIGH (ref 2.5–7.1)

## 2018-10-14 MED ORDER — ADULT MULTIVITAMIN LIQUID CH: 15.0000 mL | Freq: Every day | ORAL | 99 refills | Status: DC

## 2018-10-14 MED ORDER — POTASSIUM CHLORIDE 20 MEQ/15ML (10%) PO SOLN
20.0000 meq | Freq: Once | ORAL | Status: AC
  Administered 2018-10-14: 20 meq
  Filled 2018-10-14: qty 30

## 2018-10-14 MED ORDER — OSMOLITE 1.5 CAL PO LIQD: 120.0000 mL | Freq: Every day | ORAL | 99 refills | Status: DC

## 2018-10-14 NOTE — Progress Notes (Signed)
1 Day Post-Op  Subjective: Mild PEG site discomfort.  Objective: Vital signs in last 24 hours: Temp:  [97.6 F (36.4 C)-98.5 F (36.9 C)] 98.5 F (36.9 C) (07/18 0536) Pulse Rate:  [57-105] 103 (07/18 0536) Resp:  [10-18] 16 (07/18 0536) BP: (97-117)/(49-89) 117/89 (07/18 0536) SpO2:  [97 %-100 %] 100 % (07/18 0536) Last BM Date: 10/13/18  Intake/Output from previous day: 07/17 0701 - 07/18 0700 In: 1309.6 [I.V.:969.6; NG/GT:240] Out: 0  Intake/Output this shift: No intake/output data recorded.  General appearance: cooperative and no distress GI: Soft, PEG site clean and dry.  Lab Results:  No results for input(s): WBC, HGB, HCT, PLT in the last 72 hours. BMET Recent Labs    10/12/18 0624 10/13/18 0658  NA 138 131*  K 4.0 4.0  CL 110 105  CO2 20* 19*  GLUCOSE 117* 89  BUN 73* 61*  CREATININE 1.86* 1.66*  CALCIUM 9.1 8.5*   PT/INR No results for input(s): LABPROT, INR in the last 72 hours.  Studies/Results: Dg Foot 2 Views Left  Result Date: 10/13/2018 CLINICAL DATA:  Left foot pain, abrasion of the dorsum of the foot. Unsure of injury. EXAM: LEFT FOOT - 2 VIEW COMPARISON:  None. FINDINGS: The bones are diffusely demineralized which may limit detection small, nondisplaced fractures. The foot is in plantar flexion. Nonspecific mineralization is seen at along the plantar aspect of the first metatarsal head possibly remote posttraumatic. Mild soft tissue swelling is present. No erosion, immature periostitis or other features to suggest osteomyelitis. No soft tissue gas or foreign body. Calcaneal spurs are noted at the Achilles and plantar ligament attachments. IMPRESSION: No acute fracture traumatic malalignment. Mineralization along the plantar aspect of the first metatarsal may reflect remote posttraumatic change. No convincing radiographic evidence of osteomyelitis Electronically Signed   By: Lovena Le M.D.   On: 10/13/2018 19:28     Anti-infectives: Anti-infectives (From admission, onward)   Start     Dose/Rate Route Frequency Ordered Stop   10/13/18 1000  ceFAZolin (ANCEF) IVPB 2g/100 mL premix     2 g 200 mL/hr over 30 Minutes Intravenous On call to O.R. 10/13/18 0955 10/13/18 1122      Assessment/Plan: s/p Procedure(s): PERCUTANEOUS ENDOSCOPIC GASTROSTOMY (PEG) PLACEMENT ESOPHAGOGASTRODUODENOSCOPY (EGD) WITH PROPOFOL Impression: Stable, status post PEG placement.  Seems to be tolerating boluses well.  May restart anticoagulant therapy.  Advance tube feeds per nutrition.  Will sign off.  LOS: 3 days    Aviva Signs 10/14/2018

## 2018-10-14 NOTE — Discharge Summary (Signed)
Physician Discharge Summary  Brandy [name] EAV:[id number] DOB: 03/18/1941 DOA: 10/10/2018  PCP: [name], MD  Admit date: 10/10/2018 Discharge date: 10/14/2018  Admitted From: Home Disposition:  Home   Recommendations for Outpatient Follow-up:  1. Follow up with PCP in 1-2 weeks 2. Please obtain BMP/CBC in one week   Home Health: HHPT, RN   Discharge Condition: Stable CODE STATUS: FULL Diet recommendation: Osmolite 1.5 five times daily+ 200 cc free water bid AND dysphagia 3 diet   Brief/Interim Summary:  78 year old female with history of hypertension and hypertensive heart disease, systolicanddiastolic CHF with  EF 78%, hypertension, paroxysmal atrial fibrillation, stroke with left hemiparesis, COPD, and hyperlipidemia presenting with "dehydration". Patient was brought into the hospital per his PCP request secondary to symptoms of failure to thrive with poor oral intake and lab suggesting dehydration. The patient has had decreased oral intake for approximately 1 month. At baseline, the patient is nonambulatory and nonverbal but is alert and responsive by nodding. There is not been any history of vomiting, diarrhea, fevers, chills, headache, neck pain. Upon presentation, the patient was afebrile hemodynamically stable albeit with soft blood pressures in the 90s. Sodium was 151 with serum creatinine 2.58. WBC was 9.9. Urinalysis did not show any pyuria. Patient was started on IV fluids. Palliative medicine was consulted to assist with management. Patient's apixaban was held in anticipation for possible gastrostomy tube placement on 10/13/2018.   Discharge Diagnoses:  Acute on chronic renal failure--CKD 3 -Baseline creatinine 0.9-1.1 -Presented with serum creatinine 2.58 -Continue IV fluids-->improving -Secondary to dehydration volume depletion -serum creatinine 1.49 on day of d/c  Failure to thrive -Serum G95--621 -Folic HYQM--5.7 -PT QIONGEXBMW--41 hour  supervision -TSH--1.143 -Continue Megace -discussed with spouse and brother who wish to pursue G-tube -pt has no desire to eat or drink -PEG placed 10/13/18 -10/13/18--starting enteral feeding--osmolite 1.5 five times daily+ 200 cc free water bid -10/14/18 pt tolerating enteral feeds  Hypernatremia -Secondary to volume depletion -Judicious hypotonic fluids-->improved  Chronic combined systolic and diastolic CHF -05/29/4399 echo EF 20-25%, grade 2 DD, diffuse HK, moderate MR, PASP 49 -Clinically volume depleted presently -Daily weights -Continue carvedilol  Stroke with hemiparesis -PT evaluation--24 hour supervision -Continue apixaban and Lipitor  Paroxysmal atrial fibrillation -Currently in sinus rhythm -Holding apixaban in preparation for possible gastrostomy tube placement  Hyperlipidemia -continue statin  Severe Malnutrition -start enteral feeding after G-tube placement 10/13/18  Right Foot pain -etiology unclear -personally reviewed xray--neg -resolved  Goals of Care -palliative consult -discussed with brother and spouse--FULL CODE -they wish to pursue G-tube-->consulted general surgery  Hypokalemia -repleted    Discharge Instructions   Allergies as of 10/14/2018      Reactions   Lisinopril Swelling      Medication List    STOP taking these medications   traMADol 50 MG tablet Commonly known as: ULTRAM     TAKE these medications   acetaminophen 325 MG tablet Commonly known as: TYLENOL Take 2 tablets (650 mg total) by mouth every 4 (four) hours as needed for mild pain (or temp > 37.5 C (99.5 F)). What changed:   when to take this  reasons to take this   apixaban 5 MG Tabs tablet Commonly known as: ELIQUIS Take 1 tablet (5 mg total) by mouth 2 (two) times daily.   atorvastatin 10 MG tablet Commonly known as: LIPITOR TAKE 1 TABLET BY MOUTH EVERY DAY AT 6 PM What changed: See the new instructions.   carvedilol 6.25 MG tablet Commonly  known as:  COREG Take 1 tablet (6.25 mg total) by mouth 2 (two) times daily. What changed: how much to take   citalopram 10 MG tablet Commonly known as: CELEXA Take 10 mg by mouth daily.   donepezil 5 MG tablet Commonly known as: ARICEPT Take 1 tablet by mouth at bedtime.   feeding supplement (OSMOLITE 1.5 CAL) Liqd Place 120 mLs into feeding tube 5 (five) times daily.   furosemide 40 MG tablet Commonly known as: LASIX Take twice a day EVERY OTHER DAY   isosorbide mononitrate 30 MG 24 hr tablet Commonly known as: IMDUR TAKE 1/2 TABLET(15 MG) BY MOUTH DAILY What changed: See the new instructions.   lactobacillus acidophilus Tabs tablet Take 1 tablet by mouth daily.   megestrol 20 MG tablet Commonly known as: MEGACE Take 100 mg by mouth 2 (two) times a day.   methylphenidate 5 MG tablet Commonly known as: RITALIN Take 1 tablet (5 mg total) by mouth 2 (two) times daily with breakfast and lunch.   mirtazapine 15 MG tablet Commonly known as: REMERON Take 7.5 mg by mouth daily.   multivitamin Liqd Take 15 mLs by mouth daily. Start taking on: October 15, 2018   potassium chloride SA 20 MEQ tablet Commonly known as: K-DUR TAKE 2 TABLETS(40 MEQ) BY MOUTH DAILY What changed: See the new instructions.   spironolactone 25 MG tablet Commonly known as: ALDACTONE Take 0.5 tablets (12.5 mg total) by mouth daily.   umeclidinium-vilanterol 62.5-25 MCG/INH Aepb Commonly known as: Anoro Ellipta Inhale 1 puff into the lungs daily.       Allergies  Allergen Reactions  . Lisinopril Swelling    Consultations:  General surgery  Palliative medicine   Procedures/Studies: Dg Chest Portable 1 View  Result Date: 10/10/2018 CLINICAL DATA:  Pt with decreased appetite and weakness. Pt had recent stroke, so best image possible due to rigidity. Hx CHF, HTNWeakness EXAM: PORTABLE CHEST 1 VIEW COMPARISON:  Radiograph 03/29/2018 FINDINGS: Normal cardiac silhouette. Low lung volumes.  No effusion, infiltrate or pneumothorax. Anterior cervical fusion. IMPRESSION: No acute cardiopulmonary process. Electronically Signed   By: Suzy Bouchard M.D.   On: 10/10/2018 20:56   Dg Foot 2 Views Left  Result Date: 10/13/2018 CLINICAL DATA:  Left foot pain, abrasion of the dorsum of the foot. Unsure of injury. EXAM: LEFT FOOT - 2 VIEW COMPARISON:  None. FINDINGS: The bones are diffusely demineralized which may limit detection small, nondisplaced fractures. The foot is in plantar flexion. Nonspecific mineralization is seen at along the plantar aspect of the first metatarsal head possibly remote posttraumatic. Mild soft tissue swelling is present. No erosion, immature periostitis or other features to suggest osteomyelitis. No soft tissue gas or foreign body. Calcaneal spurs are noted at the Achilles and plantar ligament attachments. IMPRESSION: No acute fracture traumatic malalignment. Mineralization along the plantar aspect of the first metatarsal may reflect remote posttraumatic change. No convincing radiographic evidence of osteomyelitis Electronically Signed   By: Lovena Le M.D.   On: 10/13/2018 19:28        Discharge Exam: Vitals:   10/14/18 0536 10/14/18 0820  BP: 117/89   Pulse: (!) 103   Resp: 16   Temp: 98.5 F (36.9 C)   SpO2: 100% 97%   Vitals:   10/13/18 1955 10/13/18 2120 10/14/18 0536 10/14/18 0820  BP:  104/66 117/89   Pulse: 78 (!) 105 (!) 103   Resp: 16 16 16    Temp:  98 F (36.7 C) 98.5 F (36.9 C)  TempSrc:      SpO2: 98% 100% 100% 97%  Weight:      Height:        General: Pt is alert, awake, not in acute distress Cardiovascular: RRR, S1/S2 +, no rubs, no gallops Respiratory: CTA bilaterally, no wheezing, no rhonchi Abdominal: Soft, NT, ND, bowel sounds + Extremities: no edema, no cyanosis   The results of significant diagnostics from this hospitalization (including imaging, microbiology, ancillary and laboratory) are listed below for reference.     Significant Diagnostic Studies: Dg Chest Portable 1 View  Result Date: 10/10/2018 CLINICAL DATA:  Pt with decreased appetite and weakness. Pt had recent stroke, so best image possible due to rigidity. Hx CHF, HTNWeakness EXAM: PORTABLE CHEST 1 VIEW COMPARISON:  Radiograph 03/29/2018 FINDINGS: Normal cardiac silhouette. Low lung volumes. No effusion, infiltrate or pneumothorax. Anterior cervical fusion. IMPRESSION: No acute cardiopulmonary process. Electronically Signed   By: Suzy Bouchard M.D.   On: 10/10/2018 20:56   Dg Foot 2 Views Left  Result Date: 10/13/2018 CLINICAL DATA:  Left foot pain, abrasion of the dorsum of the foot. Unsure of injury. EXAM: LEFT FOOT - 2 VIEW COMPARISON:  None. FINDINGS: The bones are diffusely demineralized which may limit detection small, nondisplaced fractures. The foot is in plantar flexion. Nonspecific mineralization is seen at along the plantar aspect of the first metatarsal head possibly remote posttraumatic. Mild soft tissue swelling is present. No erosion, immature periostitis or other features to suggest osteomyelitis. No soft tissue gas or foreign body. Calcaneal spurs are noted at the Achilles and plantar ligament attachments. IMPRESSION: No acute fracture traumatic malalignment. Mineralization along the plantar aspect of the first metatarsal may reflect remote posttraumatic change. No convincing radiographic evidence of osteomyelitis Electronically Signed   By: Lovena Le M.D.   On: 10/13/2018 19:28     Microbiology: Recent Results (from the past 240 hour(s))  Culture, blood (routine x 2)     Status: None (Preliminary result)   Collection Time: 10/10/18  8:41 PM   Specimen: BLOOD LEFT FOREARM  Result Value Ref Range Status   Specimen Description BLOOD LEFT FOREARM  Final   Special Requests   Final    BOTTLES DRAWN AEROBIC AND ANAEROBIC Blood Culture adequate volume   Culture   Final    NO GROWTH 4 DAYS Performed at [facility], [address], [place], [place] [address]    Report Status PENDING  Incomplete  Culture, blood (routine x 2)     Status: None (Preliminary result)   Collection Time: 10/10/18  8:42 PM   Specimen: BLOOD RIGHT FOREARM  Result Value Ref Range Status   Specimen Description BLOOD RIGHT FOREARM  Final   Special Requests   Final    BOTTLES DRAWN AEROBIC AND ANAEROBIC Blood Culture adequate volume   Culture   Final    NO GROWTH 4 DAYS Performed at [facility], [address] [place], [place] [address]    Report Status PENDING  Incomplete  SARS Coronavirus 2 (CEPHEID - Performed in [facility] hospital lab), Hosp Order     Status: None   Collection Time: 10/10/18  9:20 PM   Specimen: Nasopharyngeal Swab  Result Value Ref Range Status   SARS Coronavirus 2 NEGATIVE NEGATIVE Final    Comment: (NOTE) If result is NEGATIVE SARS-CoV-2 target nucleic acids are NOT DETECTED. The SARS-CoV-2 RNA is generally detectable in upper and lower  respiratory specimens during the acute phase of infection. The lowest  concentration of SARS-CoV-2  viral copies this assay can detect is 250  copies / mL. A negative result does not preclude SARS-CoV-2 infection  and should not be used as the sole basis for treatment or other  patient management decisions.  A negative result may occur with  improper specimen collection / handling, submission of specimen other  than nasopharyngeal swab, presence of viral mutation(s) within the  areas targeted by this assay, and inadequate number of viral copies  (<250 copies / mL). A negative result must be combined with clinical  observations, patient history, and epidemiological information. If result is POSITIVE SARS-CoV-2 target nucleic acids are DETECTED. The SARS-CoV-2 RNA is generally detectable in upper and lower  respiratory specimens dur ing the acute phase of infection.  Positive  results are indicative of active infection with SARS-CoV-2.  Clinical  correlation with patient  history and other diagnostic information is  necessary to determine patient infection status.  Positive results do  not rule out bacterial infection or co-infection with other viruses. If result is PRESUMPTIVE POSTIVE SARS-CoV-2 nucleic acids MAY BE PRESENT.   A presumptive positive result was obtained on the submitted specimen  and confirmed on repeat testing.  While 2019 novel coronavirus  (SARS-CoV-2) nucleic acids may be present in the submitted sample  additional confirmatory testing may be necessary for epidemiological  and / or clinical management purposes  to differentiate between  SARS-CoV-2 and other Sarbecovirus currently known to infect humans.  If clinically indicated additional testing with an alternate test  methodology [phone]) is advised. The SARS-CoV-2 RNA is generally  detectable in upper and lower respiratory sp ecimens during the acute  phase of infection. The expected result is Negative. Fact Sheet for Patients:  StrictlyIdeas.no Fact Sheet for Healthcare Providers: BankingDealers.co.za This test is not yet approved or cleared by the [place] FDA and has been authorized for detection and/or diagnosis of SARS-CoV-2 by FDA under an Emergency Use Authorization (EUA).  This EUA will remain in effect (meaning this test can be used) for the duration of the COVID-19 declaration under Section 564(b)(1) of the Act, 21 U.S.C. section 360bbb-3(b)(1), unless the authorization is terminated or revoked sooner. Performed at [facility], [address] [place], [place] [address]   Surgical PCR screen     Status: None   Collection Time: 10/13/18 10:00 AM   Specimen: Nasal Mucosa; Nasal Swab  Result Value Ref Range Status   MRSA, PCR NEGATIVE NEGATIVE Final   Staphylococcus aureus NEGATIVE NEGATIVE Final    Comment: (NOTE) The Xpert SA Assay (FDA approved for NASAL specimens in patients 22 years of age and older), is one  component of a comprehensive surveillance program. It is not intended to diagnose infection nor to guide or monitor treatment. Performed at [facility], [address], [place], [place] [address]      Labs: Basic Metabolic Panel: Recent Labs  Lab 10/10/18 2041 10/11/18 0515 10/12/18 0624 10/13/18 0658  NA 151* 148* 138 131*  K 4.9 4.4 4.0 4.0  CL 122* 122* 110 105  CO2 21* 21* 20* 19*  GLUCOSE 117* 136* 117* 89  BUN 94* 91* 73* 61*  CREATININE 2.58* 2.32* 1.86* 1.66*  CALCIUM 10.0 9.3 9.1 8.5*  MG  --   --  2.1 2.0   Liver Function Tests: Recent Labs  Lab 10/10/18 2041  AST 30  ALT 16  ALKPHOS 54  BILITOT 0.5  PROT 7.1  ALBUMIN 3.8   No results for input(s): LIPASE, AMYLASE in the last  168 hours. No results for input(s): AMMONIA in the last 168 hours. CBC: Recent Labs  Lab 10/10/18 2041  WBC 9.9  NEUTROABS 6.5  HGB 13.3  HCT 44.6  MCV 95.3  PLT 247   Cardiac Enzymes: No results for input(s): CKTOTAL, CKMB, CKMBINDEX, TROPONINI in the last 168 hours. BNP: Invalid input(s): POCBNP CBG: No results for input(s): GLUCAP in the last 168 hours.  Time coordinating discharge:  36 minutes  Signed:  Orson Eva, DO [facility] Pager: [phone] 10/14/2018, 9:32 AM

## 2018-10-15 LAB — CULTURE, BLOOD (ROUTINE X 2)
Culture: NO GROWTH
Culture: NO GROWTH
Special Requests: ADEQUATE
Special Requests: ADEQUATE

## 2018-10-15 MED ORDER — APIXABAN 5 MG PO TABS
5.0000 mg | ORAL_TABLET | Freq: Two times a day (BID) | ORAL | Status: DC
  Administered 2018-10-15: 09:00:00 5 mg via ORAL
  Filled 2018-10-15: qty 1

## 2018-10-15 MED ORDER — ACETAMINOPHEN 325 MG PO TABS: 650.0000 mg | ORAL_TABLET | Freq: Four times a day (QID) | ORAL | Status: DC | PRN

## 2018-10-15 MED ORDER — ONDANSETRON HCL 4 MG/2ML IJ SOLN: 4.0000 mg | Freq: Four times a day (QID) | INTRAMUSCULAR | Status: DC | PRN

## 2018-10-15 NOTE — Progress Notes (Signed)
PROGRESS NOTE  VIKTORYA ARGUIJO HCW:[id number] DOB: 30-Jun-1940 DOA: 10/10/2018 PCP: [name], MD  Brief History:  78 year old female with history of hypertension and hypertensive heart disease, systolicanddiastolic CHF with  EF 17%, hypertension, paroxysmal atrial fibrillation, stroke with left hemiparesis, COPD, and hyperlipidemia presenting with "dehydration". Patient was brought into the hospital per his PCP request secondary to symptoms of failure to thrive with poor oral intake and lab suggesting dehydration. The patient has had decreased oral intake for approximately 1 month. At baseline, the patient is nonambulatory and nonverbal but is alert and responsive by nodding. There is not been any history of vomiting, diarrhea, fevers, chills, headache, neck pain. Upon presentation, the patient was afebrile hemodynamically stable albeit with soft blood pressures in the 90s. Sodium was 151 with serum creatinine 2.58. WBC was 9.9. Urinalysis did not show any pyuria. Patient was started on IV fluids. Palliative medicine was consulted to assist with management. Patient's apixaban was held in anticipation for possible gastrostomy tube placement on 10/13/2018.  Family confirmed they wanted full scope of care.  General surgery was consulted.  PEG was placed on 10/13/18 and enteral feedings were subsequently started.  The patient demonstrated tolerance to enteral feedings.  Home health was set up to maintain and continue enteral feedings.  Unfortunately, her discharge was delayed until 10/15/18 due to patient's spouse feeling uncomfortable with enteral feedings until the [facility] agency was able to be available at home to assist him through the process  Assessment/Plan: Acute on chronic renal failure--CKD 3 -Baseline creatinine 0.9-1.1 -Presented with serum creatinine 2.58 -Continue IV fluids-->improving -Secondary to dehydration volume depletion -serum creatinine 1.49 at the time of d/c   Failure to thrive -Serum O16--073 -Folic XTGG--2.6 -PT [id number]--50 hour supervision -TSH--1.143 -Continue Megace -discussed with spouse and brother who wish to pursue G-tube -pt has no desire to eat or drink -PEG placed 10/13/18 -10/13/18--starting enteral feeding--osmolite 1.5 five times daily+ 200 cc free water bid -10/14/18 pt tolerating enteral feeds  Hypernatremia -Secondary to volume depletion -Judicious hypotonic fluids-->improved  Chronic combined systolic and diastolic CHF -0/11/3816 echo EF 20-25%, grade 2 DD, diffuse HK, moderate MR, PASP 49 -Clinically volume depleted initially -Daily weights -Continue carvedilol  Stroke with hemiparesis -PT evaluation--24 hour supervision -restarted apixaban after surgery -Continue Lipitor  Paroxysmal atrial fibrillation -Currently in sinus rhythm -Holding apixaban in preparation for possible gastrostomy tube placement -restarted apixaban after surgery  Hyperlipidemia -continue statin  Severe Malnutrition -start enteral feeding after G-tube placement 10/13/18  Right Foot pain -etiology unclear -personally reviewed xray--neg -resolved  Goals of Care -palliative consult -discussed with brother and spouse--FULL CODE -they wish to pursue G-tube-->consultedgeneral surgery  Hypokalemia -repleted       Disposition Plan:   Home today if [facility] available  Family Communication:  No Family at bedside  Consultants:  General surgery, palliative medicine  Code Status:  FULL   DVT Prophylaxis:  apixaban   Procedures: As Listed in Progress Note Above  Antibiotics: None       Subjective: Patient denies fevers, chills, headache, chest pain, dyspnea, nausea, vomiting, diarrhea, abdominal pain, dysuria, hematuria, hematochezia, and melena.   Objective: Vitals:   10/14/18 2049 10/14/18 2057 10/15/18 0648 10/15/18 0827  BP:  106/65 106/61   Pulse:  (!) 107 92   Resp:  20    Temp:  99.2 F (37.3 C)     TempSrc:  Oral    SpO2: 96% 99%  95%  Weight:  Height:        Intake/Output Summary (Last 24 hours) at 10/15/2018 0831 Last data filed at 10/14/2018 1700 Gross per 24 hour  Intake -  Output 300 ml  Net -300 ml   Weight change:  Exam:   General:  Pt is alert, follows commands appropriately, not in acute distress  HEENT: No icterus, No thrush, No neck mass, [place]/AT  Cardiovascular: RRR, S1/S2, no rubs, no gallops  Respiratory: CTA bilaterally, no wheezing, no crackles, no rhonchi  Abdomen: Soft/+BS, mild tender around G-tube, non distended, no guarding;; no erythema or drainage around G-tube  Extremities: No edema, No lymphangitis, No petechiae, No rashes, no synovitis   Data Reviewed: I have personally reviewed following labs and imaging studies Basic Metabolic Panel: Recent Labs  Lab 10/10/18 2041 10/11/18 0515 10/12/18 0624 10/13/18 0658 10/14/18 0959  NA 151* 148* 138 131* 134*  K 4.9 4.4 4.0 4.0 3.4*  CL 122* 122* 110 105 108  CO2 21* 21* 20* 19* 19*  GLUCOSE 117* 136* 117* 89 136*  BUN 94* 91* 73* 61* 50*  CREATININE 2.58* 2.32* 1.86* 1.66* 1.49*  CALCIUM 10.0 9.3 9.1 8.5* 8.2*  MG  --   --  2.1 2.0  --    Liver Function Tests: Recent Labs  Lab 10/10/18 2041  AST 30  ALT 16  ALKPHOS 54  BILITOT 0.5  PROT 7.1  ALBUMIN 3.8   No results for input(s): LIPASE, AMYLASE in the last 168 hours. No results for input(s): AMMONIA in the last 168 hours. Coagulation Profile: No results for input(s): INR, PROTIME in the last 168 hours. CBC: Recent Labs  Lab 10/10/18 2041  WBC 9.9  NEUTROABS 6.5  HGB 13.3  HCT 44.6  MCV 95.3  PLT 247   Cardiac Enzymes: No results for input(s): CKTOTAL, CKMB, CKMBINDEX, TROPONINI in the last 168 hours. BNP: Invalid input(s): POCBNP CBG: No results for input(s): GLUCAP in the last 168 hours. HbA1C: No results for input(s): HGBA1C in the last 72 hours. Urine analysis:    Component Value Date/Time   COLORURINE  YELLOW 10/10/2018 1951   APPEARANCEUR CLEAR 10/10/2018 1951   LABSPEC 1.014 10/10/2018 1951   PHURINE 5.0 10/10/2018 1951   GLUCOSEU NEGATIVE 10/10/2018 1951   HGBUR NEGATIVE 10/10/2018 1951   BILIRUBINUR NEGATIVE 10/10/2018 [place] NEGATIVE 10/10/2018 1951   PROTEINUR NEGATIVE 10/10/2018 1951   NITRITE NEGATIVE 10/10/2018 1951   LEUKOCYTESUR NEGATIVE 10/10/2018 1951   Sepsis Labs: @LABRCNTIP (procalcitonin:4,lacticidven:4) ) Recent Results (from the past 240 hour(s))  Culture, blood (routine x 2)     Status: None (Preliminary result)   Collection Time: 10/10/18  8:41 PM   Specimen: BLOOD LEFT FOREARM  Result Value Ref Range Status   Specimen Description BLOOD LEFT FOREARM  Final   Special Requests   Final    BOTTLES DRAWN AEROBIC AND ANAEROBIC Blood Culture adequate volume   Culture   Final    NO GROWTH 4 DAYS Performed at [facility], [address] [place], [place] [address]    Report Status PENDING  Incomplete  Culture, blood (routine x 2)     Status: None (Preliminary result)   Collection Time: 10/10/18  8:42 PM   Specimen: BLOOD RIGHT FOREARM  Result Value Ref Range Status   Specimen Description BLOOD RIGHT FOREARM  Final   Special Requests   Final    BOTTLES DRAWN AEROBIC AND ANAEROBIC Blood Culture adequate volume   Culture   Final    NO  GROWTH 4 DAYS Performed at [facility], [address] [place], [place] [address]    Report Status PENDING  Incomplete  SARS Coronavirus 2 (CEPHEID - Performed in [place] hospital lab), Hosp Order     Status: None   Collection Time: 10/10/18  9:20 PM   Specimen: Nasopharyngeal Swab  Result Value Ref Range Status   SARS Coronavirus 2 NEGATIVE NEGATIVE Final    Comment: (NOTE) If result is NEGATIVE SARS-CoV-2 target nucleic acids are NOT DETECTED. The SARS-CoV-2 RNA is generally detectable in upper and lower  respiratory specimens during the acute phase of infection. The lowest  concentration of SARS-CoV-2 viral  copies this assay can detect is 250  copies / mL. A negative result does not preclude SARS-CoV-2 infection  and should not be used as the sole basis for treatment or other  patient management decisions.  A negative result may occur with  improper specimen collection / handling, submission of specimen other  than nasopharyngeal swab, presence of viral mutation(s) within the  areas targeted by this assay, and inadequate number of viral copies  (<250 copies / mL). A negative result must be combined with clinical  observations, patient history, and epidemiological information. If result is POSITIVE SARS-CoV-2 target nucleic acids are DETECTED. The SARS-CoV-2 RNA is generally detectable in upper and lower  respiratory specimens dur ing the acute phase of infection.  Positive  results are indicative of active infection with SARS-CoV-2.  Clinical  correlation with patient history and other diagnostic information is  necessary to determine patient infection status.  Positive results do  not rule out bacterial infection or co-infection with other viruses. If result is PRESUMPTIVE POSTIVE SARS-CoV-2 nucleic acids MAY BE PRESENT.   A presumptive positive result was obtained on the submitted specimen  and confirmed on repeat testing.  While 2019 novel coronavirus  (SARS-CoV-2) nucleic acids may be present in the submitted sample  additional confirmatory testing may be necessary for epidemiological  and / or clinical management purposes  to differentiate between  SARS-CoV-2 and other Sarbecovirus currently known to infect humans.  If clinically indicated additional testing with an alternate test  methodology [phone]) is advised. The SARS-CoV-2 RNA is generally  detectable in upper and lower respiratory sp ecimens during the acute  phase of infection. The expected result is Negative. Fact Sheet for Patients:  StrictlyIdeas.no Fact Sheet for Healthcare Providers:  BankingDealers.co.za This test is not yet approved or cleared by the [place] FDA and has been authorized for detection and/or diagnosis of SARS-CoV-2 by FDA under an Emergency Use Authorization (EUA).  This EUA will remain in effect (meaning this test can be used) for the duration of the COVID-19 declaration under Section 564(b)(1) of the Act, 21 U.S.C. section 360bbb-3(b)(1), unless the authorization is terminated or revoked sooner. Performed at [facility], [address], [place], [place] [address]   Surgical PCR screen     Status: None   Collection Time: 10/13/18 10:00 AM   Specimen: Nasal Mucosa; Nasal Swab  Result Value Ref Range Status   MRSA, PCR NEGATIVE NEGATIVE Final   Staphylococcus aureus NEGATIVE NEGATIVE Final    Comment: (NOTE) The Xpert SA Assay (FDA approved for NASAL specimens in patients 53 years of age and older), is one component of a comprehensive surveillance program. It is not intended to diagnose infection nor to guide or monitor treatment. Performed at [facility], [address] [place], [place] [address]      Scheduled Meds: . apixaban  5 mg Oral BID  . atorvastatin  5 mg Oral q morning - 10a  . carvedilol  3.125 mg Oral BID  . citalopram  10 mg Oral Daily  . donepezil  5 mg Oral QHS  . feeding supplement (OSMOLITE 1.5 CAL)  120 mL Per Tube 5 X Daily  . isosorbide mononitrate  15 mg Oral q morning - 10a  . megestrol  100 mg Oral BID  . methylphenidate  5 mg Oral BID WC  . multivitamin  15 mL Oral Daily  . umeclidinium-vilanterol  1 puff Inhalation Daily   Continuous Infusions:  Procedures/Studies: Dg Chest Portable 1 View  Result Date: 10/10/2018 CLINICAL DATA:  Pt with decreased appetite and weakness. Pt had recent stroke, so best image possible due to rigidity. Hx CHF, HTNWeakness EXAM: PORTABLE CHEST 1 VIEW COMPARISON:  Radiograph 03/29/2018 FINDINGS: Normal cardiac silhouette. Low lung volumes. No effusion,  infiltrate or pneumothorax. Anterior cervical fusion. IMPRESSION: No acute cardiopulmonary process. Electronically Signed   By: Suzy Bouchard M.D.   On: 10/10/2018 20:56   Dg Foot 2 Views Left  Result Date: 10/13/2018 CLINICAL DATA:  Left foot pain, abrasion of the dorsum of the foot. Unsure of injury. EXAM: LEFT FOOT - 2 VIEW COMPARISON:  None. FINDINGS: The bones are diffusely demineralized which may limit detection small, nondisplaced fractures. The foot is in plantar flexion. Nonspecific mineralization is seen at along the plantar aspect of the first metatarsal head possibly remote posttraumatic. Mild soft tissue swelling is present. No erosion, immature periostitis or other features to suggest osteomyelitis. No soft tissue gas or foreign body. Calcaneal spurs are noted at the Achilles and plantar ligament attachments. IMPRESSION: No acute fracture traumatic malalignment. Mineralization along the plantar aspect of the first metatarsal may reflect remote posttraumatic change. No convincing radiographic evidence of osteomyelitis Electronically Signed   By: Lovena Le M.D.   On: 10/13/2018 19:28    Orson Eva, DO  [facility] Pager [phone]  If 7PM-7AM, please contact night-coverage [email or web address] Password [email or web address] 10/15/2018, 8:31 AM   LOS: 4 days

## 2018-10-15 NOTE — Progress Notes (Signed)
Family and private sitters educated on how to do tube feeding with patient. Sitters were able to voice and demonstrate how to give the family tube feeding. Family states understanding of discharge instructions

## 2018-10-15 NOTE — TOC Transition Note (Signed)
Transition of Care [facility]) - CM/SW Discharge Note   Patient Details  Name: Brandy [name] MRN: [id number] Date of Birth: 04-02-1940  Transition of Care [facility]) CM/SW Contact:  Latanya Maudlin, RN Phone Number: 10/15/2018, 1:33 PM   Clinical Narrative: Patient to be discharged per MD order.Orders in place for home health services. Patient had previously been set up through [facility]. Cassie is following and awaiting notification of discharge. There is a TOC handoff stating that [facility] is involved for DME supply of the tube feeds. However, there is no note I can see that shows this has been arranged. When I spoke to [place] from [employer] about the DME he did not have the referral listed. I was able to place DME orders and per patients spouse he has been communicating with someone from [employer] about tube feed delivery scheduled for Monday. Per bedside RN they have enough of the osmolite feed to provide the patient and spouse to last them until the delivery is scheduled. The issue now is that the spouse is requesting to be taught how to manage PEG feedings by the home health team. This is really not manageable because home health RN cannot be scheduled a time to come out until discharge is completed so we are unsure of an exact start of care. WE suggested to the spouse that he come to the bedside for teaching so that he may be able to manage the PEG tube until home health could officially start. It took several conversations to explain the process of all of this to the spouse. He is currently trying to get his daughter to bring him to the bedside to get some teaching prior to discharge. At this point the only two options are for the patient to come to the bedside, bed educated on PEG management and to maintain it until home health starts OR for the patient to continue to insist that the patient come home and the home health RN educate him BUT this is not how it is typically done and it poses safety issues as the spouse  will be attempting to manage the PEG without any form of teaching until home health comes out. Again, it has been reiterated that the ladder plan is not ideal to the spouse. He voices understanding. EMS will ultimately be used for transport.         Final next level of care: [place] Barriers to Discharge: No Barriers Identified   Patient Goals and CMS Choice Patient states their goals for this hospitalization and ongoing recovery are:: Family wants her to return home to their care. CMS Medicare.gov Compare Post Acute Care list provided to:: Patient Represenative (must comment)(Spouse.) Choice offered to / list presented to : Patient  Discharge Placement                       Discharge Plan and Services     Post Acute Care Choice: Home Health, Durable Medical Equipment          DME Arranged: Tube feeding DME Agency: [facility] Date DME Agency Contacted: 10/15/18 Time DME Agency Contacted: [phone] Representative spoke with at DME Agency: [place]: RN, PT, Nurse's Aide [place] Agency: [facility] [place] Date [place]: 10/15/18 Time [place]: 1333 Representative spoke with at [place]: Cassie  Social Determinants of Health ([place]) Interventions     Readmission Risk Interventions Readmission Risk Prevention Plan 10/15/2018  Transportation Screening Complete  PCP  or Specialist Appt within 3-5 Days Complete  HRI or [place] Complete  Social Work Consult for [place] Planning/Counseling Complete  Palliative Care Screening Not Applicable  Medication Review Press photographer) Complete  Some recent data might be hidden

## 2018-10-15 NOTE — Progress Notes (Signed)
Nsg Discharge Note  Admit Date:  10/10/2018 Discharge date: 10/15/2018   [name] to be D/C'd Home per MD order.  AVS completed.  Copy for chart, and copy for patient signed, and dated. Patient/caregiver able to verbalize understanding.  Discharge Medication: Allergies as of 10/15/2018      Reactions   Lisinopril Swelling      Medication List    STOP taking these medications   traMADol 50 MG tablet Commonly known as: ULTRAM     TAKE these medications   acetaminophen 325 MG tablet Commonly known as: TYLENOL Take 2 tablets (650 mg total) by mouth every 4 (four) hours as needed for mild pain (or temp > 37.5 C (99.5 F)). What changed:   when to take this  reasons to take this   apixaban 5 MG Tabs tablet Commonly known as: ELIQUIS Take 1 tablet (5 mg total) by mouth 2 (two) times daily.   atorvastatin 10 MG tablet Commonly known as: LIPITOR TAKE 1 TABLET BY MOUTH EVERY DAY AT 6 PM What changed: See the new instructions.   carvedilol 6.25 MG tablet Commonly known as: COREG Take 1 tablet (6.25 mg total) by mouth 2 (two) times daily. What changed: how much to take   citalopram 10 MG tablet Commonly known as: CELEXA Take 10 mg by mouth daily.   donepezil 5 MG tablet Commonly known as: ARICEPT Take 1 tablet by mouth at bedtime.   feeding supplement (OSMOLITE 1.5 CAL) Liqd Place 120 mLs into feeding tube 5 (five) times daily.   furosemide 40 MG tablet Commonly known as: LASIX Take twice a day EVERY OTHER DAY   isosorbide mononitrate 30 MG 24 hr tablet Commonly known as: IMDUR TAKE 1/2 TABLET(15 MG) BY MOUTH DAILY What changed: See the new instructions.   lactobacillus acidophilus Tabs tablet Take 1 tablet by mouth daily.   megestrol 20 MG tablet Commonly known as: MEGACE Take 100 mg by mouth 2 (two) times a day.   methylphenidate 5 MG tablet Commonly known as: RITALIN Take 1 tablet (5 mg total) by mouth 2 (two) times daily with breakfast and  lunch.   mirtazapine 15 MG tablet Commonly known as: REMERON Take 7.5 mg by mouth daily.   multivitamin Liqd Take 15 mLs by mouth daily.   potassium chloride SA 20 MEQ tablet Commonly known as: K-DUR TAKE 2 TABLETS(40 MEQ) BY MOUTH DAILY What changed: See the new instructions.   spironolactone 25 MG tablet Commonly known as: ALDACTONE Take 0.5 tablets (12.5 mg total) by mouth daily.   umeclidinium-vilanterol 62.5-25 MCG/INH Aepb Commonly known as: Anoro Ellipta Inhale 1 puff into the lungs daily.            Durable Medical Equipment  (From admission, onward)         Start     Ordered   10/14/18 1100  For home use only DME Tube feeding  Once    Comments: osmolite 1.5 cal. Cal. Liquid 125 mL 5x daily   10/14/18 1100          Discharge Assessment: Vitals:   10/15/18 0827 10/15/18 1452  BP:  (!) 101/58  Pulse:  93  Resp:  16  Temp:  97.8 F (36.6 C)  SpO2: 95% 98%   Skin clean, dry and intact without evidence of skin break down, no evidence of skin tears noted. IV catheter discontinued intact. Site without signs and symptoms of complications - no redness or edema noted at insertion site, patient  denies c/o pain - only slight tenderness at site.  Dressing with slight pressure applied.  D/c Instructions-Education: Discharge instructions given to patient/family with verbalized understanding. D/c education completed with patient/family including follow up instructions, medication list, d/c activities limitations if indicated, with other d/c instructions as indicated by MD - patient able to verbalize understanding, all questions fully answered. Patient's family instructed on how to do tube feeding with patient, family was able to voice back and demostrat how to do feeding Patient instructed to return to ED, call 911, or call MD for any changes in condition.  Patient escorted via [place], and D/C home via private auto.  Loa Socks, RN 10/15/2018 4:54 PM

## 2018-10-16 ENCOUNTER — Encounter (HOSPITAL_COMMUNITY): Payer: Self-pay | Admitting: General Surgery

## 2018-10-27 ENCOUNTER — Telehealth: Payer: Self-pay | Admitting: Cardiovascular Disease

## 2018-10-27 NOTE — Telephone Encounter (Signed)
NEW MESSAGE   Patient husband called this morning wanting to schedule echo, the order in the computer states that it has been cancel.  Please advise

## 2018-10-27 NOTE — Telephone Encounter (Signed)
Per Reece Levy, husband had cancelled apt due to transportation issues.Order is still good.LM that he can call back to schedule

## 2018-11-06 ENCOUNTER — Other Ambulatory Visit: Payer: Self-pay

## 2018-11-06 ENCOUNTER — Ambulatory Visit (HOSPITAL_COMMUNITY)
Admission: RE | Admit: 2018-11-06 | Discharge: 2018-11-06 | Disposition: A | Discharge status: Home / Self Care | Payer: Medicare Other | Source: Ambulatory Visit | Attending: Cardiovascular Disease | Admitting: Cardiovascular Disease

## 2018-11-06 DIAGNOSIS — I428 Other cardiomyopathies: Secondary | ICD-10-CM | POA: Diagnosis not present

## 2018-11-06 NOTE — Progress Notes (Signed)
*  PRELIMINARY RESULTS* Echocardiogram 2D Echocardiogram has been performed.  Leavy Cella 11/06/2018, 2:17 PM

## 2018-11-21 ENCOUNTER — Telehealth: Payer: Self-pay | Admitting: Cardiovascular Disease

## 2018-11-21 NOTE — Telephone Encounter (Signed)
Called Irene Pap- no answer, left detailed message.

## 2018-11-21 NOTE — Telephone Encounter (Signed)
Would defer to Dr. Berdine Addison since he made change.

## 2018-11-21 NOTE — Telephone Encounter (Signed)
Dr. Berdine Addison changed pt's carvedilol (COREG) 6.25 MG tablet [id number]. They have been cutting it in half to give to her and would like to know if they could get a new Rx sent to her pharmacy.  Please call Irene Pap (pt's CMA) @ [phone]

## 2018-11-21 NOTE — Telephone Encounter (Signed)
Will forward to Dr. Bronson Ing to see if we should do new RX or defer it to Dr. Berdine Addison. Please advise.

## 2019-01-18 ENCOUNTER — Other Ambulatory Visit: Payer: Self-pay

## 2019-01-18 DIAGNOSIS — Z20822 Contact with and (suspected) exposure to covid-19: Secondary | ICD-10-CM

## 2019-01-20 ENCOUNTER — Telehealth: Payer: Self-pay

## 2019-01-20 LAB — NOVEL CORONAVIRUS, NAA: SARS-CoV-2, NAA: NOT DETECTED

## 2019-01-20 NOTE — Telephone Encounter (Signed)
Pt called for covid test results. Pt advised results are not back.

## 2019-01-30 ENCOUNTER — Other Ambulatory Visit: Payer: Self-pay | Admitting: *Deleted

## 2019-01-30 DIAGNOSIS — Z20822 Contact with and (suspected) exposure to covid-19: Secondary | ICD-10-CM

## 2019-01-31 LAB — NOVEL CORONAVIRUS, NAA: SARS-CoV-2, NAA: DETECTED — AB

## 2019-03-30 DIAGNOSIS — Z993 Dependence on wheelchair: Secondary | ICD-10-CM | POA: Diagnosis not present

## 2019-04-05 DIAGNOSIS — I63511 Cerebral infarction due to unspecified occlusion or stenosis of right middle cerebral artery: Secondary | ICD-10-CM | POA: Diagnosis not present

## 2019-04-05 DIAGNOSIS — I110 Hypertensive heart disease with heart failure: Secondary | ICD-10-CM | POA: Diagnosis not present

## 2019-04-05 DIAGNOSIS — I480 Paroxysmal atrial fibrillation: Secondary | ICD-10-CM | POA: Diagnosis not present

## 2019-04-05 DIAGNOSIS — I69354 Hemiplegia and hemiparesis following cerebral infarction affecting left non-dominant side: Secondary | ICD-10-CM | POA: Diagnosis not present

## 2019-04-06 DIAGNOSIS — I5042 Chronic combined systolic (congestive) and diastolic (congestive) heart failure: Secondary | ICD-10-CM | POA: Diagnosis not present

## 2019-04-06 DIAGNOSIS — I480 Paroxysmal atrial fibrillation: Secondary | ICD-10-CM | POA: Diagnosis not present

## 2019-04-06 DIAGNOSIS — I130 Hypertensive heart and chronic kidney disease with heart failure and stage 1 through stage 4 chronic kidney disease, or unspecified chronic kidney disease: Secondary | ICD-10-CM | POA: Diagnosis not present

## 2019-04-06 DIAGNOSIS — I69354 Hemiplegia and hemiparesis following cerebral infarction affecting left non-dominant side: Secondary | ICD-10-CM | POA: Diagnosis not present

## 2019-04-06 DIAGNOSIS — M62561 Muscle wasting and atrophy, not elsewhere classified, right lower leg: Secondary | ICD-10-CM | POA: Diagnosis not present

## 2019-04-06 DIAGNOSIS — I63511 Cerebral infarction due to unspecified occlusion or stenosis of right middle cerebral artery: Secondary | ICD-10-CM | POA: Diagnosis not present

## 2019-04-06 DIAGNOSIS — M62511 Muscle wasting and atrophy, not elsewhere classified, right shoulder: Secondary | ICD-10-CM | POA: Diagnosis not present

## 2019-04-06 DIAGNOSIS — I69391 Dysphagia following cerebral infarction: Secondary | ICD-10-CM | POA: Diagnosis not present

## 2019-04-06 DIAGNOSIS — N183 Chronic kidney disease, stage 3 unspecified: Secondary | ICD-10-CM | POA: Diagnosis not present

## 2019-04-11 DIAGNOSIS — I5042 Chronic combined systolic (congestive) and diastolic (congestive) heart failure: Secondary | ICD-10-CM | POA: Diagnosis not present

## 2019-04-11 DIAGNOSIS — M62511 Muscle wasting and atrophy, not elsewhere classified, right shoulder: Secondary | ICD-10-CM | POA: Diagnosis not present

## 2019-04-11 DIAGNOSIS — M62561 Muscle wasting and atrophy, not elsewhere classified, right lower leg: Secondary | ICD-10-CM | POA: Diagnosis not present

## 2019-04-11 DIAGNOSIS — I69391 Dysphagia following cerebral infarction: Secondary | ICD-10-CM | POA: Diagnosis not present

## 2019-04-11 DIAGNOSIS — I69354 Hemiplegia and hemiparesis following cerebral infarction affecting left non-dominant side: Secondary | ICD-10-CM | POA: Diagnosis not present

## 2019-04-11 DIAGNOSIS — I63511 Cerebral infarction due to unspecified occlusion or stenosis of right middle cerebral artery: Secondary | ICD-10-CM | POA: Diagnosis not present

## 2019-04-11 DIAGNOSIS — I130 Hypertensive heart and chronic kidney disease with heart failure and stage 1 through stage 4 chronic kidney disease, or unspecified chronic kidney disease: Secondary | ICD-10-CM | POA: Diagnosis not present

## 2019-04-11 DIAGNOSIS — I480 Paroxysmal atrial fibrillation: Secondary | ICD-10-CM | POA: Diagnosis not present

## 2019-04-11 DIAGNOSIS — N183 Chronic kidney disease, stage 3 unspecified: Secondary | ICD-10-CM | POA: Diagnosis not present

## 2019-04-13 DIAGNOSIS — I63511 Cerebral infarction due to unspecified occlusion or stenosis of right middle cerebral artery: Secondary | ICD-10-CM | POA: Diagnosis not present

## 2019-04-13 DIAGNOSIS — I5042 Chronic combined systolic (congestive) and diastolic (congestive) heart failure: Secondary | ICD-10-CM | POA: Diagnosis not present

## 2019-04-13 DIAGNOSIS — M62561 Muscle wasting and atrophy, not elsewhere classified, right lower leg: Secondary | ICD-10-CM | POA: Diagnosis not present

## 2019-04-13 DIAGNOSIS — I480 Paroxysmal atrial fibrillation: Secondary | ICD-10-CM | POA: Diagnosis not present

## 2019-04-13 DIAGNOSIS — N183 Chronic kidney disease, stage 3 unspecified: Secondary | ICD-10-CM | POA: Diagnosis not present

## 2019-04-13 DIAGNOSIS — I130 Hypertensive heart and chronic kidney disease with heart failure and stage 1 through stage 4 chronic kidney disease, or unspecified chronic kidney disease: Secondary | ICD-10-CM | POA: Diagnosis not present

## 2019-04-13 DIAGNOSIS — M62511 Muscle wasting and atrophy, not elsewhere classified, right shoulder: Secondary | ICD-10-CM | POA: Diagnosis not present

## 2019-04-13 DIAGNOSIS — I69391 Dysphagia following cerebral infarction: Secondary | ICD-10-CM | POA: Diagnosis not present

## 2019-04-13 DIAGNOSIS — I69354 Hemiplegia and hemiparesis following cerebral infarction affecting left non-dominant side: Secondary | ICD-10-CM | POA: Diagnosis not present

## 2019-04-17 DIAGNOSIS — I69391 Dysphagia following cerebral infarction: Secondary | ICD-10-CM | POA: Diagnosis not present

## 2019-04-17 DIAGNOSIS — I130 Hypertensive heart and chronic kidney disease with heart failure and stage 1 through stage 4 chronic kidney disease, or unspecified chronic kidney disease: Secondary | ICD-10-CM | POA: Diagnosis not present

## 2019-04-17 DIAGNOSIS — I480 Paroxysmal atrial fibrillation: Secondary | ICD-10-CM | POA: Diagnosis not present

## 2019-04-17 DIAGNOSIS — N183 Chronic kidney disease, stage 3 unspecified: Secondary | ICD-10-CM | POA: Diagnosis not present

## 2019-04-17 DIAGNOSIS — M62561 Muscle wasting and atrophy, not elsewhere classified, right lower leg: Secondary | ICD-10-CM | POA: Diagnosis not present

## 2019-04-17 DIAGNOSIS — I69354 Hemiplegia and hemiparesis following cerebral infarction affecting left non-dominant side: Secondary | ICD-10-CM | POA: Diagnosis not present

## 2019-04-17 DIAGNOSIS — M62511 Muscle wasting and atrophy, not elsewhere classified, right shoulder: Secondary | ICD-10-CM | POA: Diagnosis not present

## 2019-04-17 DIAGNOSIS — I63511 Cerebral infarction due to unspecified occlusion or stenosis of right middle cerebral artery: Secondary | ICD-10-CM | POA: Diagnosis not present

## 2019-04-17 DIAGNOSIS — I5042 Chronic combined systolic (congestive) and diastolic (congestive) heart failure: Secondary | ICD-10-CM | POA: Diagnosis not present

## 2019-04-19 DIAGNOSIS — I5042 Chronic combined systolic (congestive) and diastolic (congestive) heart failure: Secondary | ICD-10-CM | POA: Diagnosis not present

## 2019-04-19 DIAGNOSIS — M62511 Muscle wasting and atrophy, not elsewhere classified, right shoulder: Secondary | ICD-10-CM | POA: Diagnosis not present

## 2019-04-19 DIAGNOSIS — I69391 Dysphagia following cerebral infarction: Secondary | ICD-10-CM | POA: Diagnosis not present

## 2019-04-19 DIAGNOSIS — I480 Paroxysmal atrial fibrillation: Secondary | ICD-10-CM | POA: Diagnosis not present

## 2019-04-19 DIAGNOSIS — I63511 Cerebral infarction due to unspecified occlusion or stenosis of right middle cerebral artery: Secondary | ICD-10-CM | POA: Diagnosis not present

## 2019-04-19 DIAGNOSIS — M62561 Muscle wasting and atrophy, not elsewhere classified, right lower leg: Secondary | ICD-10-CM | POA: Diagnosis not present

## 2019-04-19 DIAGNOSIS — I130 Hypertensive heart and chronic kidney disease with heart failure and stage 1 through stage 4 chronic kidney disease, or unspecified chronic kidney disease: Secondary | ICD-10-CM | POA: Diagnosis not present

## 2019-04-19 DIAGNOSIS — N183 Chronic kidney disease, stage 3 unspecified: Secondary | ICD-10-CM | POA: Diagnosis not present

## 2019-04-19 DIAGNOSIS — I69354 Hemiplegia and hemiparesis following cerebral infarction affecting left non-dominant side: Secondary | ICD-10-CM | POA: Diagnosis not present

## 2019-04-23 DIAGNOSIS — I63511 Cerebral infarction due to unspecified occlusion or stenosis of right middle cerebral artery: Secondary | ICD-10-CM | POA: Diagnosis not present

## 2019-04-23 DIAGNOSIS — I69354 Hemiplegia and hemiparesis following cerebral infarction affecting left non-dominant side: Secondary | ICD-10-CM | POA: Diagnosis not present

## 2019-04-23 DIAGNOSIS — M62561 Muscle wasting and atrophy, not elsewhere classified, right lower leg: Secondary | ICD-10-CM | POA: Diagnosis not present

## 2019-04-23 DIAGNOSIS — Z993 Dependence on wheelchair: Secondary | ICD-10-CM | POA: Diagnosis not present

## 2019-04-23 DIAGNOSIS — M62511 Muscle wasting and atrophy, not elsewhere classified, right shoulder: Secondary | ICD-10-CM | POA: Diagnosis not present

## 2019-04-23 DIAGNOSIS — I4891 Unspecified atrial fibrillation: Secondary | ICD-10-CM | POA: Diagnosis not present

## 2019-04-23 DIAGNOSIS — I5042 Chronic combined systolic (congestive) and diastolic (congestive) heart failure: Secondary | ICD-10-CM | POA: Diagnosis not present

## 2019-04-23 DIAGNOSIS — I69391 Dysphagia following cerebral infarction: Secondary | ICD-10-CM | POA: Diagnosis not present

## 2019-04-23 DIAGNOSIS — N183 Chronic kidney disease, stage 3 unspecified: Secondary | ICD-10-CM | POA: Diagnosis not present

## 2019-04-23 DIAGNOSIS — J449 Chronic obstructive pulmonary disease, unspecified: Secondary | ICD-10-CM | POA: Diagnosis not present

## 2019-04-23 DIAGNOSIS — I130 Hypertensive heart and chronic kidney disease with heart failure and stage 1 through stage 4 chronic kidney disease, or unspecified chronic kidney disease: Secondary | ICD-10-CM | POA: Diagnosis not present

## 2019-04-23 DIAGNOSIS — I480 Paroxysmal atrial fibrillation: Secondary | ICD-10-CM | POA: Diagnosis not present

## 2019-04-27 DIAGNOSIS — N183 Chronic kidney disease, stage 3 unspecified: Secondary | ICD-10-CM | POA: Diagnosis not present

## 2019-04-27 DIAGNOSIS — I130 Hypertensive heart and chronic kidney disease with heart failure and stage 1 through stage 4 chronic kidney disease, or unspecified chronic kidney disease: Secondary | ICD-10-CM | POA: Diagnosis not present

## 2019-04-27 DIAGNOSIS — I69354 Hemiplegia and hemiparesis following cerebral infarction affecting left non-dominant side: Secondary | ICD-10-CM | POA: Diagnosis not present

## 2019-04-27 DIAGNOSIS — M62511 Muscle wasting and atrophy, not elsewhere classified, right shoulder: Secondary | ICD-10-CM | POA: Diagnosis not present

## 2019-04-27 DIAGNOSIS — I5042 Chronic combined systolic (congestive) and diastolic (congestive) heart failure: Secondary | ICD-10-CM | POA: Diagnosis not present

## 2019-04-27 DIAGNOSIS — I480 Paroxysmal atrial fibrillation: Secondary | ICD-10-CM | POA: Diagnosis not present

## 2019-04-27 DIAGNOSIS — I63511 Cerebral infarction due to unspecified occlusion or stenosis of right middle cerebral artery: Secondary | ICD-10-CM | POA: Diagnosis not present

## 2019-04-27 DIAGNOSIS — M62561 Muscle wasting and atrophy, not elsewhere classified, right lower leg: Secondary | ICD-10-CM | POA: Diagnosis not present

## 2019-04-27 DIAGNOSIS — I69391 Dysphagia following cerebral infarction: Secondary | ICD-10-CM | POA: Diagnosis not present

## 2019-04-30 DIAGNOSIS — N183 Chronic kidney disease, stage 3 unspecified: Secondary | ICD-10-CM | POA: Diagnosis not present

## 2019-04-30 DIAGNOSIS — I63511 Cerebral infarction due to unspecified occlusion or stenosis of right middle cerebral artery: Secondary | ICD-10-CM | POA: Diagnosis not present

## 2019-04-30 DIAGNOSIS — I130 Hypertensive heart and chronic kidney disease with heart failure and stage 1 through stage 4 chronic kidney disease, or unspecified chronic kidney disease: Secondary | ICD-10-CM | POA: Diagnosis not present

## 2019-04-30 DIAGNOSIS — M62511 Muscle wasting and atrophy, not elsewhere classified, right shoulder: Secondary | ICD-10-CM | POA: Diagnosis not present

## 2019-04-30 DIAGNOSIS — I5042 Chronic combined systolic (congestive) and diastolic (congestive) heart failure: Secondary | ICD-10-CM | POA: Diagnosis not present

## 2019-04-30 DIAGNOSIS — M62561 Muscle wasting and atrophy, not elsewhere classified, right lower leg: Secondary | ICD-10-CM | POA: Diagnosis not present

## 2019-04-30 DIAGNOSIS — I69391 Dysphagia following cerebral infarction: Secondary | ICD-10-CM | POA: Diagnosis not present

## 2019-04-30 DIAGNOSIS — I69354 Hemiplegia and hemiparesis following cerebral infarction affecting left non-dominant side: Secondary | ICD-10-CM | POA: Diagnosis not present

## 2019-04-30 DIAGNOSIS — Z993 Dependence on wheelchair: Secondary | ICD-10-CM | POA: Diagnosis not present

## 2019-04-30 DIAGNOSIS — I480 Paroxysmal atrial fibrillation: Secondary | ICD-10-CM | POA: Diagnosis not present

## 2019-05-01 DIAGNOSIS — N183 Chronic kidney disease, stage 3 unspecified: Secondary | ICD-10-CM | POA: Diagnosis not present

## 2019-05-01 DIAGNOSIS — I69391 Dysphagia following cerebral infarction: Secondary | ICD-10-CM | POA: Diagnosis not present

## 2019-05-01 DIAGNOSIS — M62561 Muscle wasting and atrophy, not elsewhere classified, right lower leg: Secondary | ICD-10-CM | POA: Diagnosis not present

## 2019-05-01 DIAGNOSIS — I63511 Cerebral infarction due to unspecified occlusion or stenosis of right middle cerebral artery: Secondary | ICD-10-CM | POA: Diagnosis not present

## 2019-05-01 DIAGNOSIS — M62511 Muscle wasting and atrophy, not elsewhere classified, right shoulder: Secondary | ICD-10-CM | POA: Diagnosis not present

## 2019-05-01 DIAGNOSIS — I5042 Chronic combined systolic (congestive) and diastolic (congestive) heart failure: Secondary | ICD-10-CM | POA: Diagnosis not present

## 2019-05-01 DIAGNOSIS — I130 Hypertensive heart and chronic kidney disease with heart failure and stage 1 through stage 4 chronic kidney disease, or unspecified chronic kidney disease: Secondary | ICD-10-CM | POA: Diagnosis not present

## 2019-05-01 DIAGNOSIS — I69354 Hemiplegia and hemiparesis following cerebral infarction affecting left non-dominant side: Secondary | ICD-10-CM | POA: Diagnosis not present

## 2019-05-01 DIAGNOSIS — I480 Paroxysmal atrial fibrillation: Secondary | ICD-10-CM | POA: Diagnosis not present

## 2019-05-01 NOTE — Progress Notes (Signed)
Cardiology Office Note    Date:  05/07/2019   ID:  [name], [name] 05-22-1940, MRN [id number]  PCP:  [name], MD  Cardiologist: Kate Sable, MD EPS: None  Chief Complaint  Patient presents with  . Follow-up    History of Present Illness:  Brandy [name] is a 79 y.o. female with history of hypertension hypertensive heart disease, chronic combined systolic and diastolic CHF LVEF 123456 improved to 35 to 40% on echo 10/2018, mild nonobstructive disease on catheter in 2019, PAF on Eliquis, CVA with left hemiparesis, COPD, HLD.  Patient last saw Dr. Bronson Ing 08/2018  consider an ICD if her LV did not improve.  2D echo 28/10/20 LVEF 35 to 40%  Patient was hospitalized last July with decreased oral intake and dehydration.  She is nonambulatory and nonverbal sodium was 151 creatinine 2.58 palliative care became involved  on day of discharge.  PEG was placed. Patient had covid 01/2019  Patient comes in accompanied by her husband and a caregiver but not the main caregiver. They don't know exactly how she is taking her meds. Not on lasix & potassium anymore. Dr. Berdine Addison stopped it because of low BP. no cough, shortness of breath, palpitations, chest pain, edema or bleeding problems.  Past Medical History:  Diagnosis Date  . Cardiomyopathy    a. EF of 25% in 07/2006 b. EF normalized by repeat echo in 2011 c. EF 35-40% by echo in 05/2017 with cath showing mild nonobstructive CAD  . Chronic combined systolic and diastolic CHF (congestive heart failure) ([place])   . Colon polyps   . Diverticulosis of colon   . Hypertension   . Hypertensive heart disease   . Osteopenia   . Paroxysmal atrial fibrillation ([place]) 10/10/2018  . Stroke ([place])   . TIA (transient ischemic attack)   . Tobacco abuse    50 pack year    Past Surgical History:  Procedure Laterality Date  . BREAST BIOPSY    . CARPAL TUNNEL RELEASE     right hand  . CERVICAL FUSION    . CESAREAN SECTION     2 times  .  COLONOSCOPY  [id number]   pt uncertain as to whether polypectomy required or performed  . ECTOPIC PREGNANCY SURGERY    . ESOPHAGOGASTRODUODENOSCOPY (EGD) WITH PROPOFOL N/A 10/13/2018   Procedure: ESOPHAGOGASTRODUODENOSCOPY (EGD) WITH PROPOFOL;  Surgeon: Aviva Signs, MD;  Location: [facility];  Service: General;  Laterality: N/A;  . IR ANGIO VERTEBRAL SEL SUBCLAVIAN INNOMINATE UNI R MOD SED  02/10/2018  . IR CT HEAD LTD  02/10/2018  . IR PERCUTANEOUS ART THROMBECTOMY/INFUSION INTRACRANIAL INC DIAG ANGIO  02/10/2018  . PEG PLACEMENT N/A 10/13/2018   Procedure: PERCUTANEOUS ENDOSCOPIC GASTROSTOMY (PEG) PLACEMENT;  Surgeon: Aviva Signs, MD;  Location: [facility];  Service: General;  Laterality: N/A;  . RADIOLOGY WITH ANESTHESIA N/A 02/10/2018   Procedure: RADIOLOGY WITH ANESTHESIA;  Surgeon: Luanne Bras, MD;  Location: [facility];  Service: Radiology;  Laterality: N/A;  . RIGHT/LEFT HEART CATH AND CORONARY ANGIOGRAPHY N/A 06/03/2017   Procedure: RIGHT/LEFT HEART CATH AND CORONARY ANGIOGRAPHY;  Surgeon: Jettie Booze, MD;  Location: [facility];  Service: Cardiovascular;  Laterality: N/A;  . TEE WITHOUT CARDIOVERSION N/A 02/13/2018   Procedure: TRANSESOPHAGEAL ECHOCARDIOGRAM (TEE);  Surgeon: Sueanne Margarita, MD;  [facility] END[facility]COPY;  Service: Cardiovascular;  Laterality: N/A;  . TONSILLECTOMY      Current Medications: Current Meds  Medication Sig  . acetaminophen (TYLENOL) 325 MG tablet Take  2 tablets (650 mg total) by mouth every 4 (four) hours as needed for mild pain (or temp > 37.5 C (99.5 F)). (Patient taking differently: Take 650 mg by mouth every 6 (six) hours as needed for mild pain or moderate pain. )  . apixaban (ELIQUIS) 5 MG TABS tablet Take 1 tablet (5 mg total) by mouth 2 (two) times daily.  Marland Kitchen atorvastatin (LIPITOR) 10 MG tablet TAKE 1 TABLET BY MOUTH EVERY DAY AT 6 PM (Patient taking differently: Take 5 mg by mouth every morning. )  . carvedilol (COREG) 6.25 MG tablet Take 1  tablet (6.25 mg total) by mouth 2 (two) times daily. (Patient taking differently: Take 3.125 mg by mouth 2 (two) times daily. )  . citalopram (CELEXA) 10 MG tablet Take 10 mg by mouth daily.  Marland Kitchen donepezil (ARICEPT) 5 MG tablet Take 1 tablet by mouth at bedtime.  . furosemide (LASIX) 40 MG tablet Take twice a day EVERY OTHER DAY  . isosorbide mononitrate (IMDUR) 30 MG 24 hr tablet TAKE 1/2 TABLET(15 MG) BY MOUTH DAILY (Patient taking differently: Take 15 mg by mouth every morning. )  . lactobacillus acidophilus (BACID) TABS tablet Take 1 tablet by mouth daily.  . megestrol (MEGACE) 20 MG tablet Take 100 mg by mouth 2 (two) times a day.  . methylphenidate (RITALIN) 5 MG tablet Take 1 tablet (5 mg total) by mouth 2 (two) times daily with breakfast and lunch.  . mirtazapine (REMERON) 15 MG tablet Take 7.5 mg by mouth daily.  . Multiple Vitamin (MULTIVITAMIN) LIQD Take 15 mLs by mouth daily.  . Nutritional Supplements (FEEDING SUPPLEMENT, OSMOLITE 1.5 CAL,) LIQD Place 120 mLs into feeding tube 5 (five) times daily.  . potassium chloride SA (K-DUR,KLOR-CON) 20 MEQ tablet TAKE 2 TABLETS(40 MEQ) BY MOUTH DAILY (Patient taking differently: Take 40 mEq by mouth daily. )  . spironolactone (ALDACTONE) 25 MG tablet Take 0.5 tablets (12.5 mg total) by mouth daily.  Marland Kitchen umeclidinium-vilanterol (ANORO ELLIPTA) 62.5-25 MCG/INH AEPB Inhale 1 puff into the lungs daily.     Allergies:   Lisinopril   Social History   Socioeconomic History  . Marital status: Married    Spouse name: Not on file  . Number of children: Not on file  . Years of education: Not on file  . Highest education level: Not on file  Occupational History  . Occupation: Former Product manager: RETIRED  Tobacco Use  . Smoking status: Former Smoker    Packs/day: 1.00    Years: 50.00    Pack years: 50.00    Types: Cigarettes    Start date: 09/02/1960    Quit date: 03/29/2008    Years since quitting: 11.1  . Smokeless tobacco: Never Used    Substance and Sexual Activity  . Alcohol use: No    Alcohol/week: 0.0 standard drinks  . Drug use: Never  . Sexual activity: Not Currently  Other Topics Concern  . Not on file  Social History Narrative   Married with 2 adult children, resides with spouse    Social Determinants of Health   Financial Resource Strain:   . Difficulty of Paying Living Expenses: Not on file  Food Insecurity:   . Worried About Charity fundraiser in the Last Year: Not on file  . Ran Out of Food in the Last Year: Not on file  Transportation Needs:   . Lack of Transportation (Medical): Not on file  . Lack of Transportation (Non-Medical): Not on file  Physical Activity:   . Days of Exercise per Week: Not on file  . Minutes of Exercise per Session: Not on file  Stress:   . Feeling of Stress : Not on file  Social Connections:   . Frequency of Communication with Friends and Family: Not on file  . Frequency of Social Gatherings with Friends and Family: Not on file  . Attends Religious Services: Not on file  . Active Member of Clubs or Organizations: Not on file  . Attends Archivist Meetings: Not on file  . Marital Status: Not on file     Family History:  The patient's family history includes Cancer in her paternal grandmother; Hypertension in her brother and maternal grandmother.   ROS:   Please see the history of present illness.    ROS All other systems reviewed and are negative.   PHYSICAL EXAM:   VS:  BP 128/67   Pulse (!) 59   Temp 99.2 F (37.3 C) (Temporal)   Ht 5\' 4"  (1.626 m)   SpO2 94%   BMI 20.43 kg/m   Physical Exam  GEN: Thin, in no acute distress  Neck: no JVD, carotid bruits, or masses Cardiac:RRR; no murmurs, rubs, or gallops  Respiratory:  clear to auscultation bilaterally, normal work of breathing GI: soft, nontender, nondistended, + BS Ext: without cyanosis, clubbing, or edema, Good distal pulses bilaterally Neuro:  Alert and Oriented x 3 Psych: euthymic  mood, full affect  Wt Readings from Last 3 Encounters:  10/11/18 119 lb 0.8 oz (54 kg)  04/04/18 125 lb 14.4 oz (57.1 kg)  02/16/18 188 lb 14.9 oz (85.7 kg)      Studies/Labs Reviewed:   EKG:  EKG is not ordered today.    Recent Labs: 10/10/2018: ALT 16; Hemoglobin 13.3; Platelets 247 10/12/2018: TSH 1.143 10/13/2018: Magnesium 2.0 10/14/2018: BUN 50; Creatinine, Ser 1.49; Potassium 3.4; Sodium 134   Lipid Panel    Component Value Date/Time   CHOL 93 02/11/2018 0447   TRIG 42 02/11/2018 0447   HDL 33 (L) 02/11/2018 0447   CHOLHDL 2.8 02/11/2018 0447   VLDL 8 02/11/2018 0447   LDLCALC 52 02/11/2018 0447    Additional studies/ records that were reviewed today include:  Echo 11/06/2018  IMPRESSIONS     1. The left ventricle has moderately reduced systolic function, with an  ejection fraction of 35-40%. The cavity size was normal. There is moderate  concentric left ventricular hypertrophy. Left ventricular diastolic  Doppler parameters are consistent with   pseudonormal. Left ventricular diffuse hypokinesis.   2. Left atrial size was mildly dilated.   3. The mitral valve is grossly normal.   4. The tricuspid valve was grossly normal. Tricuspid valve regurgitation  was not assessed by color flow Doppler.   5. The aortic valve is tricuspid.   6. The aortic root is normal in size and structure.   7. The interatrial septum was not assessed.   FINDINGS   Left Ventricle: The left ventricle has moderately reduced systolic  function, with an ejection fraction of 35-40%. The cavity size was normal.  There is moderate concentric left ventricular hypertrophy. Left  ventricular diastolic Doppler parameters are  consistent with pseudonormal (grade II). Left ventricular diffuse  hypokinesis.     Right Ventricle: The right ventricle has mildly reduced systolic function.  The cavity was normal. There is no increase in right ventricular wall  thickness.   Left Atrium: Left atrial  size was mildly dilated.  Right Atrium: Right atrial size was normal in size. Right atrial pressure  is estimated at 10 mmHg.   Interatrial Septum: The interatrial septum was not assessed.   Pericardium: There is no evidence of pericardial effusion.   Mitral Valve: The mitral valve is grossly normal. Mitral valve  regurgitation is mild by color flow Doppler.   Tricuspid Valve: The tricuspid valve was grossly normal. Tricuspid valve  regurgitation was not assessed by color flow Doppler.   Aortic Valve: The aortic valve is tricuspid Aortic valve regurgitation was  not visualized by color flow Doppler. There is no evidence of aortic valve  stenosis.   Pulmonic Valve: The pulmonic valve was not well visualized. Pulmonic valve  regurgitation is not visualized by color flow Doppler.   Aorta: The aortic root is normal in size and structure.   Echocardiogram performed on 03/30/2018:   - Left ventricle: The cavity size was mildly dilated. Wall   thickness was increased in a pattern of moderate LVH. Systolic   function was severely reduced. The estimated ejection fraction   was in the range of 20% to 25%. Diffuse hypokinesis. Features are   consistent with a pseudonormal left ventricular filling pattern,   with concomitant abnormal relaxation and increased filling   pressure (grade 2 diastolic dysfunction). Doppler parameters are   consistent with high ventricular filling pressure. - Mitral valve: Calcified annulus. Mildly thickened leaflets .   There was mild to moderate regurgitation. - Left atrium: The atrium was mildly dilated. - Pulmonary arteries: Systolic pressure was moderately increased.   PA peak pressure: 49 mm Hg (S).   Impressions:   - Severe global reduction in LV systolic function; moderate   diastolic dysfunction with elevated LV filling pressure; moderate   LVH; mild LVE; mild to moderate MR; mild LAE; mild TR with   moderate pulmonary hypertension.     Coronary  angiography on 06/03/17 demonstrated mild nonobstructive disease with mildly elevated right heart pressures with volume overload.  There was a mid RCA 25% stenosis and a 25% stenosis of the ostium of the second diagonal branch.   Echocardiogram performed on 03/30/2018:   - Left ventricle: The cavity size was mildly dilated. Wall   thickness was increased in a pattern of moderate LVH. Systolic   function was severely reduced. The estimated ejection fraction   was in the range of 20% to 25%. Diffuse hypokinesis. Features are   consistent with a pseudonormal left ventricular filling pattern,   with concomitant abnormal relaxation and increased filling   pressure (grade 2 diastolic dysfunction). Doppler parameters are   consistent with high ventricular filling pressure. - Mitral valve: Calcified annulus. Mildly thickened leaflets .   There was mild to moderate regurgitation. - Left atrium: The atrium was mildly dilated. - Pulmonary arteries: Systolic pressure was moderately increased.   PA peak pressure: 49 mm Hg (S).   Impressions:   - Severe global reduction in LV systolic function; moderate   diastolic dysfunction with elevated LV filling pressure; moderate   LVH; mild LVE; mild to moderate MR; mild LAE; mild TR with   moderate pulmonary hypertension.     Coronary angiography on 06/03/17 demonstrated mild nonobstructive disease with mildly elevated right heart pressures with volume overload.  There was a mid RCA 25% stenosis and a 25% stenosis of the ostium of the second diagonal branch.      ASSESSMENT:    1. Paroxysmal atrial fibrillation (HCC)   2. Chronic combined systolic and diastolic CHF (  congestive heart failure) ([place])   3. Recurrent cerebrovascular accidents (CVAs) ([place])   4. Essential hypertension   5. Hyperlipidemia, unspecified hyperlipidemia type      PLAN:  In order of problems listed above:  PAF on Eliquis and carvedilol.  I spent time on the phone with her  caregiver and her daughter trying to get her medications straight.  She is currently only on  carvedilol 6.25 mg one half twice daily.  Still taking Eliquis.  No bleeding problems.  Will check surveillance labs.  Chronic combined systolic and diastolic CHF EF initially 20 to 25% improved on echo 10/2018 LVEF 35 to 40%.  Lasix potassium and spironolactone were stopped by PCP.  No heart failure on exam today.  Is being fed through PEG tube.  Has lost significant weight in the past 2 years.  Will check labs.  History of recurrent CVA on Eliquis and atorvastatin  Essential hypertension blood pressure tends to run low.  Hyperlipidemia LDL 52 01/2018 managed by PCP on low-dose Lipitor    Medication Adjustments/Labs and Tests Ordered: Current medicines are reviewed at length with the patient today.  Concerns regarding medicines are outlined above.  Medication changes, Labs and Tests ordered today are listed in the Patient Instructions below. There are no Patient Instructions on file for this visit.   Sumner Boast, PA-C  05/07/2019 11:36 AM    [facility] [address], [place], [place]  [address] Phone: [phone]; Fax: [phone]

## 2019-05-04 DIAGNOSIS — I480 Paroxysmal atrial fibrillation: Secondary | ICD-10-CM | POA: Diagnosis not present

## 2019-05-04 DIAGNOSIS — I63511 Cerebral infarction due to unspecified occlusion or stenosis of right middle cerebral artery: Secondary | ICD-10-CM | POA: Diagnosis not present

## 2019-05-04 DIAGNOSIS — M62561 Muscle wasting and atrophy, not elsewhere classified, right lower leg: Secondary | ICD-10-CM | POA: Diagnosis not present

## 2019-05-04 DIAGNOSIS — I69391 Dysphagia following cerebral infarction: Secondary | ICD-10-CM | POA: Diagnosis not present

## 2019-05-04 DIAGNOSIS — I5042 Chronic combined systolic (congestive) and diastolic (congestive) heart failure: Secondary | ICD-10-CM | POA: Diagnosis not present

## 2019-05-04 DIAGNOSIS — I130 Hypertensive heart and chronic kidney disease with heart failure and stage 1 through stage 4 chronic kidney disease, or unspecified chronic kidney disease: Secondary | ICD-10-CM | POA: Diagnosis not present

## 2019-05-04 DIAGNOSIS — N183 Chronic kidney disease, stage 3 unspecified: Secondary | ICD-10-CM | POA: Diagnosis not present

## 2019-05-04 DIAGNOSIS — M62511 Muscle wasting and atrophy, not elsewhere classified, right shoulder: Secondary | ICD-10-CM | POA: Diagnosis not present

## 2019-05-04 DIAGNOSIS — I69354 Hemiplegia and hemiparesis following cerebral infarction affecting left non-dominant side: Secondary | ICD-10-CM | POA: Diagnosis not present

## 2019-05-06 ENCOUNTER — Ambulatory Visit: Payer: Medicare PPO

## 2019-05-07 ENCOUNTER — Encounter: Payer: Self-pay | Admitting: Physician Assistant

## 2019-05-07 ENCOUNTER — Other Ambulatory Visit: Payer: Self-pay

## 2019-05-07 ENCOUNTER — Other Ambulatory Visit (HOSPITAL_COMMUNITY)
Admission: RE | Admit: 2019-05-07 | Discharge: 2019-05-07 | Disposition: A | Discharge status: Home / Self Care | Payer: Medicare PPO | Source: Ambulatory Visit | Attending: Physician Assistant | Admitting: Physician Assistant

## 2019-05-07 ENCOUNTER — Ambulatory Visit (INDEPENDENT_AMBULATORY_CARE_PROVIDER_SITE_OTHER): Payer: Medicare PPO | Admitting: Physician Assistant

## 2019-05-07 VITALS — BP 128/67 | HR 59 | Temp 99.2°F | Ht 64.0 in

## 2019-05-07 DIAGNOSIS — I480 Paroxysmal atrial fibrillation: Secondary | ICD-10-CM | POA: Insufficient documentation

## 2019-05-07 DIAGNOSIS — I10 Essential (primary) hypertension: Secondary | ICD-10-CM

## 2019-05-07 DIAGNOSIS — I5042 Chronic combined systolic (congestive) and diastolic (congestive) heart failure: Secondary | ICD-10-CM | POA: Insufficient documentation

## 2019-05-07 DIAGNOSIS — I639 Cerebral infarction, unspecified: Secondary | ICD-10-CM | POA: Diagnosis not present

## 2019-05-07 DIAGNOSIS — E785 Hyperlipidemia, unspecified: Secondary | ICD-10-CM | POA: Diagnosis not present

## 2019-05-07 LAB — CBC
HCT: 35.6 % — ABNORMAL LOW (ref 36.0–46.0)
Hemoglobin: 10.8 g/dL — ABNORMAL LOW (ref 12.0–15.0)
MCH: 28.8 pg (ref 26.0–34.0)
MCHC: 30.3 g/dL (ref 30.0–36.0)
MCV: 94.9 fL (ref 80.0–100.0)
Platelets: 246 10*3/uL (ref 150–400)
RBC: 3.75 MIL/uL — ABNORMAL LOW (ref 3.87–5.11)
RDW: 13.7 % (ref 11.5–15.5)
WBC: 3.7 10*3/uL — ABNORMAL LOW (ref 4.0–10.5)
nRBC: 0 % (ref 0.0–0.2)

## 2019-05-07 LAB — BASIC METABOLIC PANEL
Anion gap: 8 (ref 5–15)
BUN: 35 mg/dL — ABNORMAL HIGH (ref 8–23)
CO2: 25 mmol/L (ref 22–32)
Calcium: 9.7 mg/dL (ref 8.9–10.3)
Chloride: 105 mmol/L (ref 98–111)
Creatinine, Ser: 1.65 mg/dL — ABNORMAL HIGH (ref 0.44–1.00)
GFR calc Af Amer: 35 mL/min — ABNORMAL LOW (ref >60)
GFR calc non Af Amer: 30 mL/min — ABNORMAL LOW (ref >60)
Glucose, Bld: 84 mg/dL (ref 70–99)
Potassium: 4.9 mmol/L (ref 3.5–5.1)
Sodium: 138 mmol/L (ref 135–145)

## 2019-05-07 NOTE — Patient Instructions (Signed)
Medication Instructions:  Your physician recommends that you continue on your current medications as directed. Please refer to the Current Medication list given to you today.  *If you need a refill on your cardiac medications before your next appointment, please call your pharmacy*  Lab Work: Your physician recommends that you return for lab work in: Today   If you have labs (blood work) drawn today and your tests are completely normal, you will receive your results only by: Marland Kitchen MyChart Message (if you have MyChart) OR . A paper copy in the mail If you have any lab test that is abnormal or we need to change your treatment, we will call you to review the results.  Testing/Procedures: NONE   Follow-Up: At [facility], you and your health needs are our priority.  As part of our continuing mission to provide you with exceptional heart care, we have created designated Provider Care Teams.  These Care Teams include your primary Cardiologist (physician) and Advanced Practice Providers (APPs -  Physician Assistants and Nurse Practitioners) who all work together to provide you with the care you need, when you need it.  Your next appointment:   6 month(s)  The format for your next appointment:   In Person  Provider:   Kate Sable, MD  Other Instructions Thank you for choosing [facility]!

## 2019-05-07 NOTE — Addendum Note (Signed)
Addended by: Levonne Hubert on: 05/07/2019 11:54 AM   Modules accepted: Orders

## 2019-05-08 DIAGNOSIS — I63511 Cerebral infarction due to unspecified occlusion or stenosis of right middle cerebral artery: Secondary | ICD-10-CM | POA: Diagnosis not present

## 2019-05-08 DIAGNOSIS — I480 Paroxysmal atrial fibrillation: Secondary | ICD-10-CM | POA: Diagnosis not present

## 2019-05-08 DIAGNOSIS — I69354 Hemiplegia and hemiparesis following cerebral infarction affecting left non-dominant side: Secondary | ICD-10-CM | POA: Diagnosis not present

## 2019-05-08 DIAGNOSIS — M62561 Muscle wasting and atrophy, not elsewhere classified, right lower leg: Secondary | ICD-10-CM | POA: Diagnosis not present

## 2019-05-08 DIAGNOSIS — I5042 Chronic combined systolic (congestive) and diastolic (congestive) heart failure: Secondary | ICD-10-CM | POA: Diagnosis not present

## 2019-05-08 DIAGNOSIS — I130 Hypertensive heart and chronic kidney disease with heart failure and stage 1 through stage 4 chronic kidney disease, or unspecified chronic kidney disease: Secondary | ICD-10-CM | POA: Diagnosis not present

## 2019-05-08 DIAGNOSIS — N183 Chronic kidney disease, stage 3 unspecified: Secondary | ICD-10-CM | POA: Diagnosis not present

## 2019-05-08 DIAGNOSIS — M62511 Muscle wasting and atrophy, not elsewhere classified, right shoulder: Secondary | ICD-10-CM | POA: Diagnosis not present

## 2019-05-08 DIAGNOSIS — I69391 Dysphagia following cerebral infarction: Secondary | ICD-10-CM | POA: Diagnosis not present

## 2019-05-09 DIAGNOSIS — I130 Hypertensive heart and chronic kidney disease with heart failure and stage 1 through stage 4 chronic kidney disease, or unspecified chronic kidney disease: Secondary | ICD-10-CM | POA: Diagnosis not present

## 2019-05-09 DIAGNOSIS — I63511 Cerebral infarction due to unspecified occlusion or stenosis of right middle cerebral artery: Secondary | ICD-10-CM | POA: Diagnosis not present

## 2019-05-09 DIAGNOSIS — I69354 Hemiplegia and hemiparesis following cerebral infarction affecting left non-dominant side: Secondary | ICD-10-CM | POA: Diagnosis not present

## 2019-05-09 DIAGNOSIS — I5042 Chronic combined systolic (congestive) and diastolic (congestive) heart failure: Secondary | ICD-10-CM | POA: Diagnosis not present

## 2019-05-09 DIAGNOSIS — N183 Chronic kidney disease, stage 3 unspecified: Secondary | ICD-10-CM | POA: Diagnosis not present

## 2019-05-09 DIAGNOSIS — M62511 Muscle wasting and atrophy, not elsewhere classified, right shoulder: Secondary | ICD-10-CM | POA: Diagnosis not present

## 2019-05-09 DIAGNOSIS — I69391 Dysphagia following cerebral infarction: Secondary | ICD-10-CM | POA: Diagnosis not present

## 2019-05-09 DIAGNOSIS — I480 Paroxysmal atrial fibrillation: Secondary | ICD-10-CM | POA: Diagnosis not present

## 2019-05-09 DIAGNOSIS — M62561 Muscle wasting and atrophy, not elsewhere classified, right lower leg: Secondary | ICD-10-CM | POA: Diagnosis not present

## 2019-05-10 DIAGNOSIS — I480 Paroxysmal atrial fibrillation: Secondary | ICD-10-CM | POA: Diagnosis not present

## 2019-05-10 DIAGNOSIS — I63511 Cerebral infarction due to unspecified occlusion or stenosis of right middle cerebral artery: Secondary | ICD-10-CM | POA: Diagnosis not present

## 2019-05-10 DIAGNOSIS — I69391 Dysphagia following cerebral infarction: Secondary | ICD-10-CM | POA: Diagnosis not present

## 2019-05-10 DIAGNOSIS — M62511 Muscle wasting and atrophy, not elsewhere classified, right shoulder: Secondary | ICD-10-CM | POA: Diagnosis not present

## 2019-05-10 DIAGNOSIS — I130 Hypertensive heart and chronic kidney disease with heart failure and stage 1 through stage 4 chronic kidney disease, or unspecified chronic kidney disease: Secondary | ICD-10-CM | POA: Diagnosis not present

## 2019-05-10 DIAGNOSIS — M62561 Muscle wasting and atrophy, not elsewhere classified, right lower leg: Secondary | ICD-10-CM | POA: Diagnosis not present

## 2019-05-10 DIAGNOSIS — N183 Chronic kidney disease, stage 3 unspecified: Secondary | ICD-10-CM | POA: Diagnosis not present

## 2019-05-10 DIAGNOSIS — I5042 Chronic combined systolic (congestive) and diastolic (congestive) heart failure: Secondary | ICD-10-CM | POA: Diagnosis not present

## 2019-05-10 DIAGNOSIS — I69354 Hemiplegia and hemiparesis following cerebral infarction affecting left non-dominant side: Secondary | ICD-10-CM | POA: Diagnosis not present

## 2019-05-14 ENCOUNTER — Telehealth: Payer: Self-pay

## 2019-05-14 NOTE — Telephone Encounter (Signed)
LM on cell phone for patient to call back to try and get her weight.

## 2019-05-14 NOTE — Telephone Encounter (Signed)
-----   Message from Imogene Burn, PA-C sent at 05/10/2019  7:45 AM EST ----- Did you ask them to weigh the patient like I asked? She wasn't weighed in the office. thanks ----- Message ----- From: Interface, Lab In [place] Sent: 05/07/2019  12:50 PM EST To: Imogene Burn, PA-C

## 2019-05-14 NOTE — Telephone Encounter (Signed)
Husband has not weighed her in over a year as she cannot stand

## 2019-05-15 DIAGNOSIS — I130 Hypertensive heart and chronic kidney disease with heart failure and stage 1 through stage 4 chronic kidney disease, or unspecified chronic kidney disease: Secondary | ICD-10-CM | POA: Diagnosis not present

## 2019-05-15 DIAGNOSIS — M62561 Muscle wasting and atrophy, not elsewhere classified, right lower leg: Secondary | ICD-10-CM | POA: Diagnosis not present

## 2019-05-15 DIAGNOSIS — I69354 Hemiplegia and hemiparesis following cerebral infarction affecting left non-dominant side: Secondary | ICD-10-CM | POA: Diagnosis not present

## 2019-05-15 DIAGNOSIS — I63511 Cerebral infarction due to unspecified occlusion or stenosis of right middle cerebral artery: Secondary | ICD-10-CM | POA: Diagnosis not present

## 2019-05-15 DIAGNOSIS — I480 Paroxysmal atrial fibrillation: Secondary | ICD-10-CM | POA: Diagnosis not present

## 2019-05-15 DIAGNOSIS — M62511 Muscle wasting and atrophy, not elsewhere classified, right shoulder: Secondary | ICD-10-CM | POA: Diagnosis not present

## 2019-05-15 DIAGNOSIS — N183 Chronic kidney disease, stage 3 unspecified: Secondary | ICD-10-CM | POA: Diagnosis not present

## 2019-05-15 DIAGNOSIS — I5042 Chronic combined systolic (congestive) and diastolic (congestive) heart failure: Secondary | ICD-10-CM | POA: Diagnosis not present

## 2019-05-15 DIAGNOSIS — I69391 Dysphagia following cerebral infarction: Secondary | ICD-10-CM | POA: Diagnosis not present

## 2019-05-22 DIAGNOSIS — I69391 Dysphagia following cerebral infarction: Secondary | ICD-10-CM | POA: Diagnosis not present

## 2019-05-22 DIAGNOSIS — M62561 Muscle wasting and atrophy, not elsewhere classified, right lower leg: Secondary | ICD-10-CM | POA: Diagnosis not present

## 2019-05-22 DIAGNOSIS — I480 Paroxysmal atrial fibrillation: Secondary | ICD-10-CM | POA: Diagnosis not present

## 2019-05-22 DIAGNOSIS — M62511 Muscle wasting and atrophy, not elsewhere classified, right shoulder: Secondary | ICD-10-CM | POA: Diagnosis not present

## 2019-05-22 DIAGNOSIS — I5042 Chronic combined systolic (congestive) and diastolic (congestive) heart failure: Secondary | ICD-10-CM | POA: Diagnosis not present

## 2019-05-22 DIAGNOSIS — I69354 Hemiplegia and hemiparesis following cerebral infarction affecting left non-dominant side: Secondary | ICD-10-CM | POA: Diagnosis not present

## 2019-05-22 DIAGNOSIS — I63511 Cerebral infarction due to unspecified occlusion or stenosis of right middle cerebral artery: Secondary | ICD-10-CM | POA: Diagnosis not present

## 2019-05-22 DIAGNOSIS — N183 Chronic kidney disease, stage 3 unspecified: Secondary | ICD-10-CM | POA: Diagnosis not present

## 2019-05-22 DIAGNOSIS — I130 Hypertensive heart and chronic kidney disease with heart failure and stage 1 through stage 4 chronic kidney disease, or unspecified chronic kidney disease: Secondary | ICD-10-CM | POA: Diagnosis not present

## 2019-05-24 DIAGNOSIS — I4891 Unspecified atrial fibrillation: Secondary | ICD-10-CM | POA: Diagnosis not present

## 2019-05-24 DIAGNOSIS — J449 Chronic obstructive pulmonary disease, unspecified: Secondary | ICD-10-CM | POA: Diagnosis not present

## 2019-05-24 DIAGNOSIS — I130 Hypertensive heart and chronic kidney disease with heart failure and stage 1 through stage 4 chronic kidney disease, or unspecified chronic kidney disease: Secondary | ICD-10-CM | POA: Diagnosis not present

## 2019-05-24 DIAGNOSIS — I5042 Chronic combined systolic (congestive) and diastolic (congestive) heart failure: Secondary | ICD-10-CM | POA: Diagnosis not present

## 2019-05-24 DIAGNOSIS — Z993 Dependence on wheelchair: Secondary | ICD-10-CM | POA: Diagnosis not present

## 2019-05-24 DIAGNOSIS — N183 Chronic kidney disease, stage 3 unspecified: Secondary | ICD-10-CM | POA: Diagnosis not present

## 2019-05-24 DIAGNOSIS — I69354 Hemiplegia and hemiparesis following cerebral infarction affecting left non-dominant side: Secondary | ICD-10-CM | POA: Diagnosis not present

## 2019-05-24 DIAGNOSIS — I69391 Dysphagia following cerebral infarction: Secondary | ICD-10-CM | POA: Diagnosis not present

## 2019-05-24 DIAGNOSIS — I480 Paroxysmal atrial fibrillation: Secondary | ICD-10-CM | POA: Diagnosis not present

## 2019-05-24 DIAGNOSIS — I63511 Cerebral infarction due to unspecified occlusion or stenosis of right middle cerebral artery: Secondary | ICD-10-CM | POA: Diagnosis not present

## 2019-05-24 DIAGNOSIS — M62561 Muscle wasting and atrophy, not elsewhere classified, right lower leg: Secondary | ICD-10-CM | POA: Diagnosis not present

## 2019-05-24 DIAGNOSIS — M62511 Muscle wasting and atrophy, not elsewhere classified, right shoulder: Secondary | ICD-10-CM | POA: Diagnosis not present

## 2019-05-28 DIAGNOSIS — I130 Hypertensive heart and chronic kidney disease with heart failure and stage 1 through stage 4 chronic kidney disease, or unspecified chronic kidney disease: Secondary | ICD-10-CM | POA: Diagnosis not present

## 2019-05-28 DIAGNOSIS — I69391 Dysphagia following cerebral infarction: Secondary | ICD-10-CM | POA: Diagnosis not present

## 2019-05-28 DIAGNOSIS — M62511 Muscle wasting and atrophy, not elsewhere classified, right shoulder: Secondary | ICD-10-CM | POA: Diagnosis not present

## 2019-05-28 DIAGNOSIS — I5042 Chronic combined systolic (congestive) and diastolic (congestive) heart failure: Secondary | ICD-10-CM | POA: Diagnosis not present

## 2019-05-28 DIAGNOSIS — I480 Paroxysmal atrial fibrillation: Secondary | ICD-10-CM | POA: Diagnosis not present

## 2019-05-28 DIAGNOSIS — I63511 Cerebral infarction due to unspecified occlusion or stenosis of right middle cerebral artery: Secondary | ICD-10-CM | POA: Diagnosis not present

## 2019-05-28 DIAGNOSIS — N183 Chronic kidney disease, stage 3 unspecified: Secondary | ICD-10-CM | POA: Diagnosis not present

## 2019-05-28 DIAGNOSIS — M62561 Muscle wasting and atrophy, not elsewhere classified, right lower leg: Secondary | ICD-10-CM | POA: Diagnosis not present

## 2019-05-28 DIAGNOSIS — Z993 Dependence on wheelchair: Secondary | ICD-10-CM | POA: Diagnosis not present

## 2019-05-28 DIAGNOSIS — I69354 Hemiplegia and hemiparesis following cerebral infarction affecting left non-dominant side: Secondary | ICD-10-CM | POA: Diagnosis not present

## 2019-06-01 DIAGNOSIS — I69354 Hemiplegia and hemiparesis following cerebral infarction affecting left non-dominant side: Secondary | ICD-10-CM | POA: Diagnosis not present

## 2019-06-01 DIAGNOSIS — M62511 Muscle wasting and atrophy, not elsewhere classified, right shoulder: Secondary | ICD-10-CM | POA: Diagnosis not present

## 2019-06-01 DIAGNOSIS — I63511 Cerebral infarction due to unspecified occlusion or stenosis of right middle cerebral artery: Secondary | ICD-10-CM | POA: Diagnosis not present

## 2019-06-01 DIAGNOSIS — I5042 Chronic combined systolic (congestive) and diastolic (congestive) heart failure: Secondary | ICD-10-CM | POA: Diagnosis not present

## 2019-06-01 DIAGNOSIS — I480 Paroxysmal atrial fibrillation: Secondary | ICD-10-CM | POA: Diagnosis not present

## 2019-06-01 DIAGNOSIS — I130 Hypertensive heart and chronic kidney disease with heart failure and stage 1 through stage 4 chronic kidney disease, or unspecified chronic kidney disease: Secondary | ICD-10-CM | POA: Diagnosis not present

## 2019-06-01 DIAGNOSIS — I69391 Dysphagia following cerebral infarction: Secondary | ICD-10-CM | POA: Diagnosis not present

## 2019-06-01 DIAGNOSIS — N183 Chronic kidney disease, stage 3 unspecified: Secondary | ICD-10-CM | POA: Diagnosis not present

## 2019-06-01 DIAGNOSIS — M62561 Muscle wasting and atrophy, not elsewhere classified, right lower leg: Secondary | ICD-10-CM | POA: Diagnosis not present

## 2019-06-04 DIAGNOSIS — I480 Paroxysmal atrial fibrillation: Secondary | ICD-10-CM | POA: Diagnosis not present

## 2019-06-04 DIAGNOSIS — Z7901 Long term (current) use of anticoagulants: Secondary | ICD-10-CM | POA: Diagnosis not present

## 2019-06-04 DIAGNOSIS — Z931 Gastrostomy status: Secondary | ICD-10-CM | POA: Diagnosis not present

## 2019-06-04 DIAGNOSIS — I69354 Hemiplegia and hemiparesis following cerebral infarction affecting left non-dominant side: Secondary | ICD-10-CM | POA: Diagnosis not present

## 2019-06-04 DIAGNOSIS — N183 Chronic kidney disease, stage 3 unspecified: Secondary | ICD-10-CM | POA: Diagnosis not present

## 2019-06-04 DIAGNOSIS — I5042 Chronic combined systolic (congestive) and diastolic (congestive) heart failure: Secondary | ICD-10-CM | POA: Diagnosis not present

## 2019-06-05 ENCOUNTER — Encounter: Payer: Self-pay | Admitting: General Surgery

## 2019-06-05 ENCOUNTER — Other Ambulatory Visit: Payer: Self-pay

## 2019-06-05 ENCOUNTER — Ambulatory Visit: Payer: Medicare PPO | Admitting: General Surgery

## 2019-06-05 VITALS — BP 113/73 | HR 71 | Temp 98.3°F | Resp 12 | Ht 64.0 in | Wt 124.0 lb

## 2019-06-05 DIAGNOSIS — Z431 Encounter for attention to gastrostomy: Secondary | ICD-10-CM

## 2019-06-05 NOTE — Progress Notes (Signed)
Subjective:     Brandy [name]  Patient was brought in by family members for evaluation of her PEG tube.  They had questions as to whether it needs to be replaced or not.  It has been flushing and working fine, but they have noticed some debris within the tubing itself.  They have used meat tenderizer and Coca[employer] to try to keep the tube clean.  No leakage has been noted around the gastrostomy tube site. Objective:    BP 113/73   Pulse 71   Temp 98.3 F (36.8 C) (Oral)   Resp 12   Ht 5\' 4"  (1.626 m)   Wt 124 lb (56.2 kg)   SpO2 96%   BMI 21.28 kg/m   General:  cooperative, no distress and wheelchair bound  Abdomen:  Soft.  PEG tube in appropriate position at the 3 Arriyana Rodell.  A portion of the distal tube has some weakness of the rubber.  This was excised and the tube shortened.     Assessment:    PEG tube encounter    Plan:    No need for replacement at this time.  Family understands and agrees. Follow up here as needed.

## 2019-06-21 DIAGNOSIS — J449 Chronic obstructive pulmonary disease, unspecified: Secondary | ICD-10-CM | POA: Diagnosis not present

## 2019-06-21 DIAGNOSIS — Z993 Dependence on wheelchair: Secondary | ICD-10-CM | POA: Diagnosis not present

## 2019-06-21 DIAGNOSIS — I4891 Unspecified atrial fibrillation: Secondary | ICD-10-CM | POA: Diagnosis not present

## 2019-06-28 DIAGNOSIS — Z993 Dependence on wheelchair: Secondary | ICD-10-CM | POA: Diagnosis not present

## 2019-07-22 DIAGNOSIS — Z993 Dependence on wheelchair: Secondary | ICD-10-CM | POA: Diagnosis not present

## 2019-07-22 DIAGNOSIS — J449 Chronic obstructive pulmonary disease, unspecified: Secondary | ICD-10-CM | POA: Diagnosis not present

## 2019-07-22 DIAGNOSIS — I4891 Unspecified atrial fibrillation: Secondary | ICD-10-CM | POA: Diagnosis not present

## 2019-07-23 ENCOUNTER — Other Ambulatory Visit: Payer: Self-pay | Admitting: Cardiovascular Disease

## 2019-07-28 DIAGNOSIS — Z993 Dependence on wheelchair: Secondary | ICD-10-CM | POA: Diagnosis not present

## 2019-08-20 IMAGING — DX DG CHEST 1V PORT
1 series · 1 of 1 positions shown · non-contrast
Comparison: February 15, 2018

CLINICAL DATA: Shortness of breath for 2 days

EXAM:
PORTABLE CHEST 1 VIEW

[chest ap]
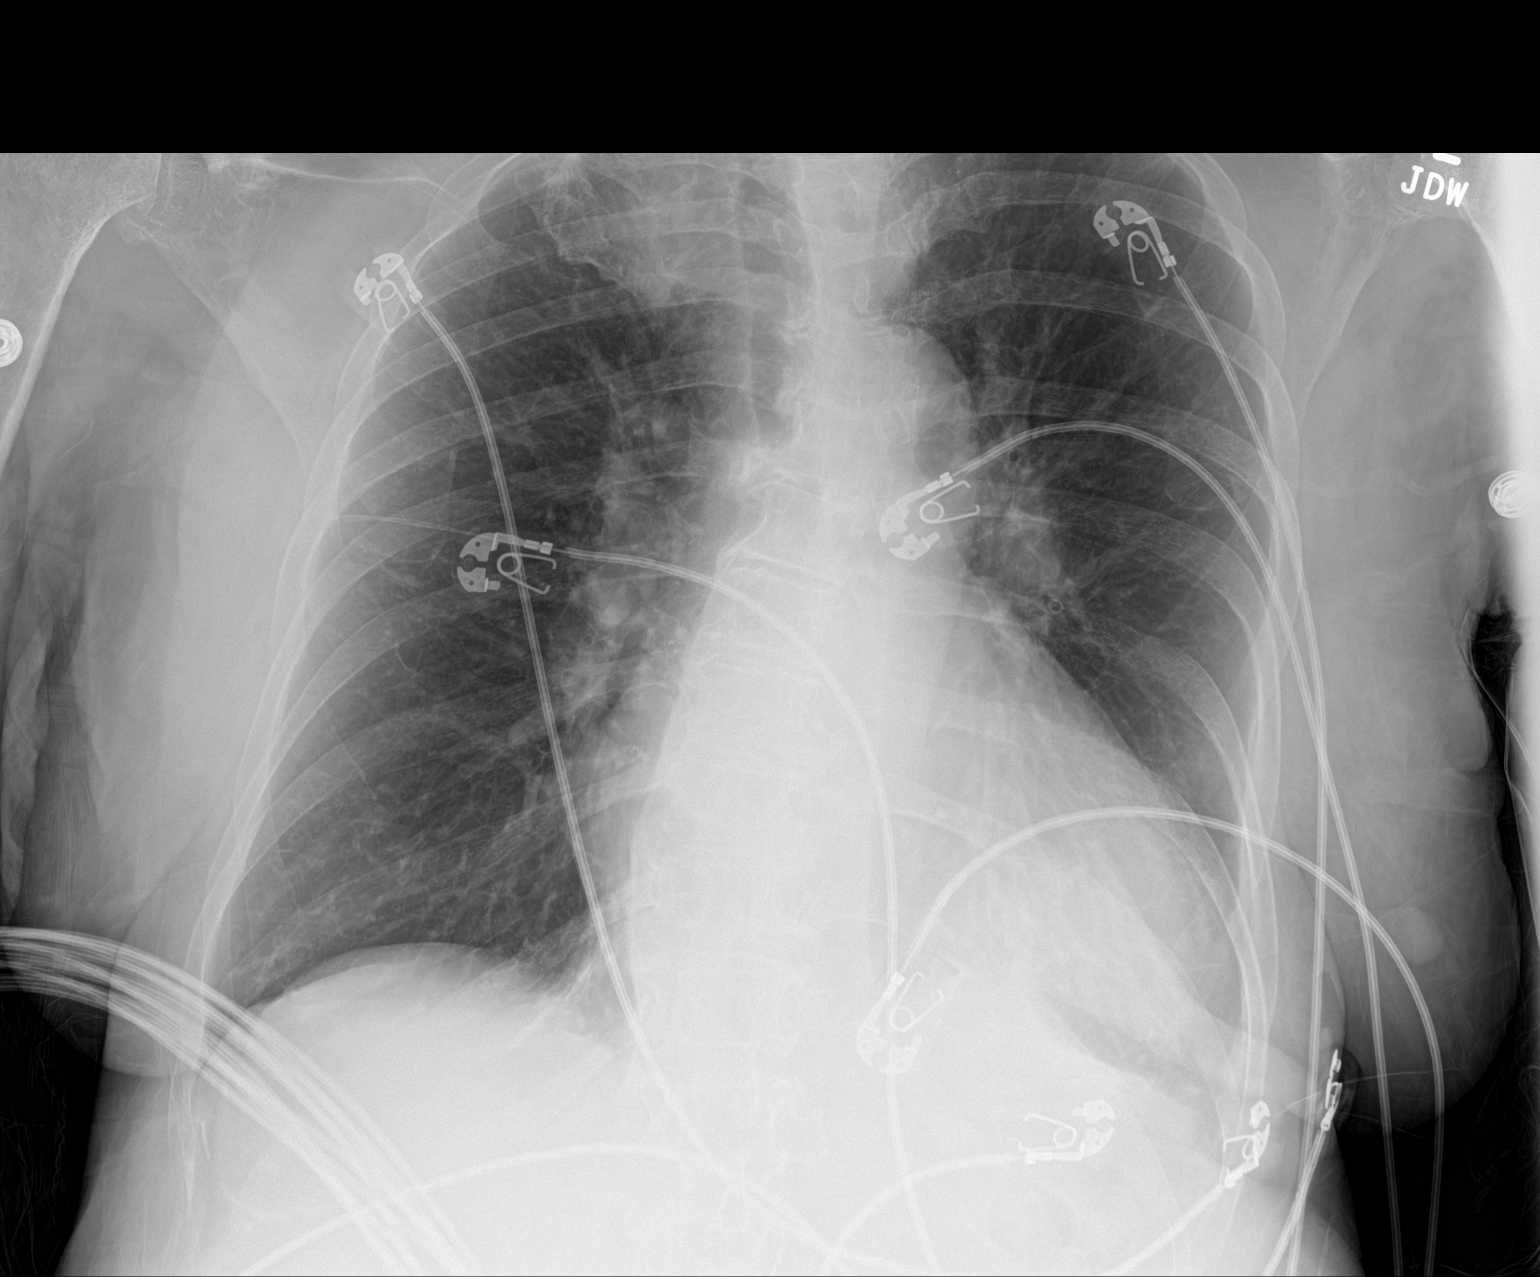

[1 of 1 positions shown; findings below may reference images not displayed]

FINDINGS: The heart size and mediastinal contours are stable. Heart size is
enlarged. There is mild patchy opacity of the medial left lung base.
There is no pulmonary edema or pleural effusion. The visualized
skeletal structures are stable.
IMPRESSION: Mild patchy opacity of the medial left lung base, developing
pneumonia is not excluded.

## 2019-08-21 DIAGNOSIS — J449 Chronic obstructive pulmonary disease, unspecified: Secondary | ICD-10-CM | POA: Diagnosis not present

## 2019-08-21 DIAGNOSIS — Z993 Dependence on wheelchair: Secondary | ICD-10-CM | POA: Diagnosis not present

## 2019-08-21 DIAGNOSIS — I4891 Unspecified atrial fibrillation: Secondary | ICD-10-CM | POA: Diagnosis not present

## 2019-08-21 IMAGING — CR DG CHEST 2V
2 series · 2 of 2 positions shown · non-contrast
Comparison: 03/28/2018, 02/15/2018 and earlier.

CLINICAL DATA: 77-year-old who had acute RIGHT MIDDLE cerebral
artery occlusion with endovascular revascularization 02/10/2018.
Follow-up possible LEFT lung base pneumonia.

EXAM:
CHEST - 2 VIEW

[chest lat]
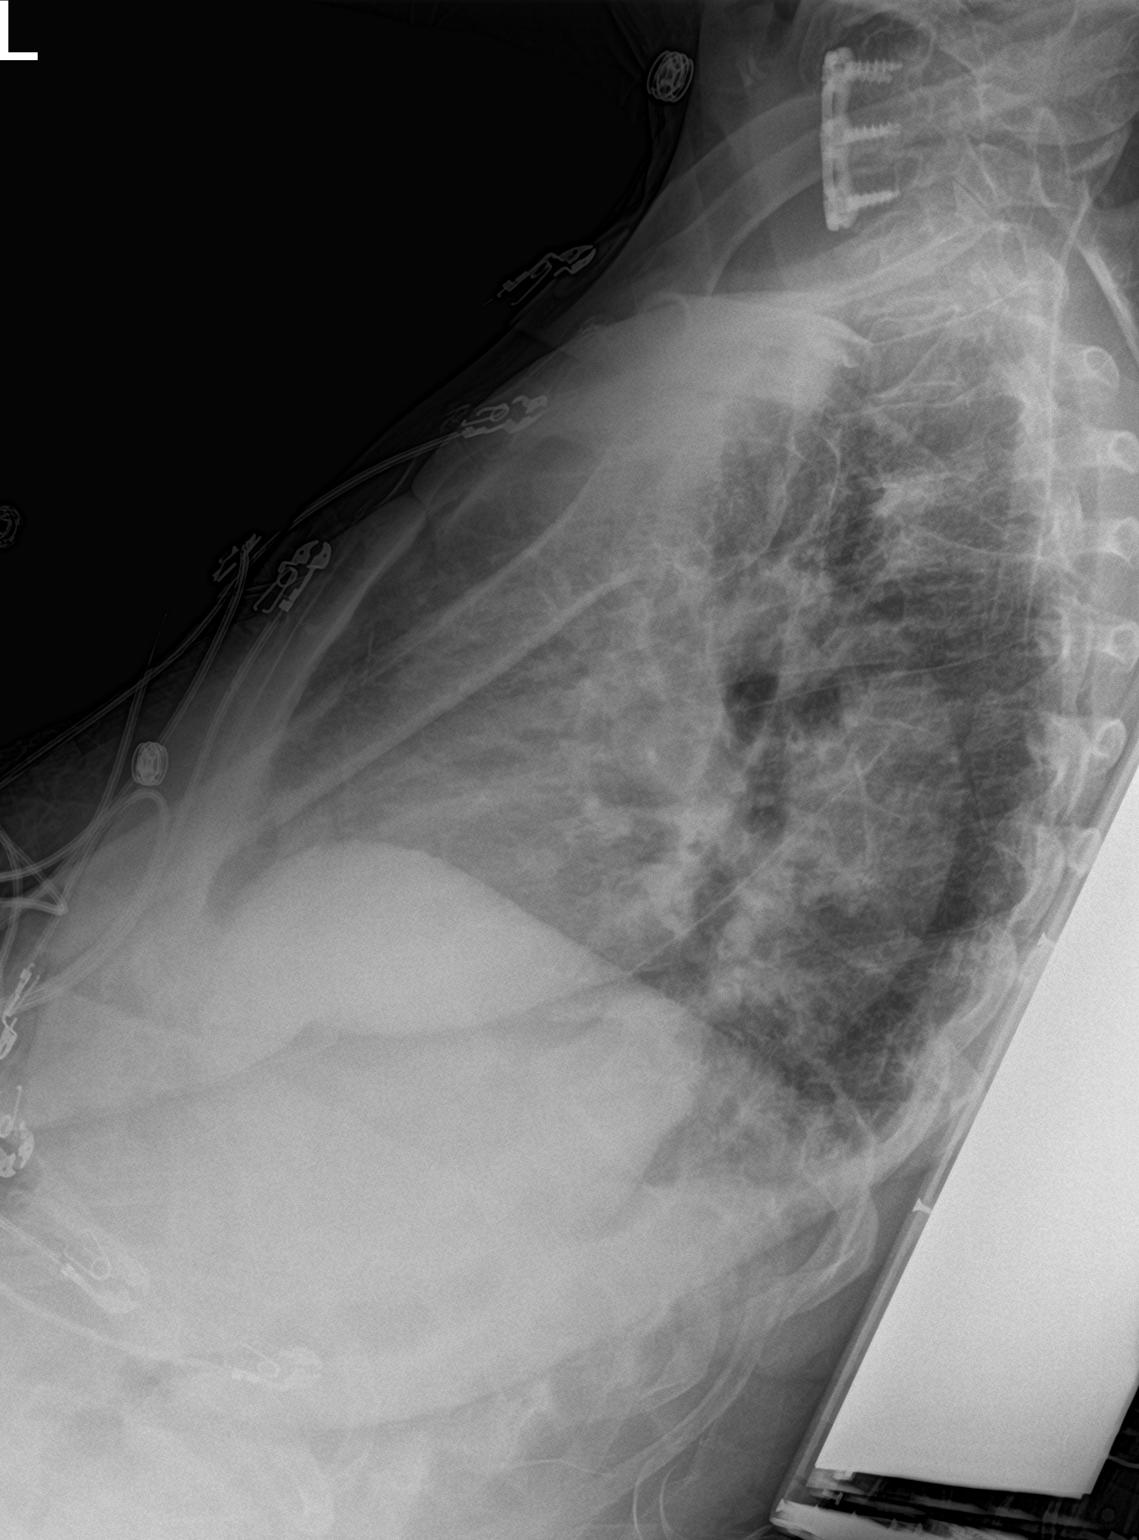

[chest ap]
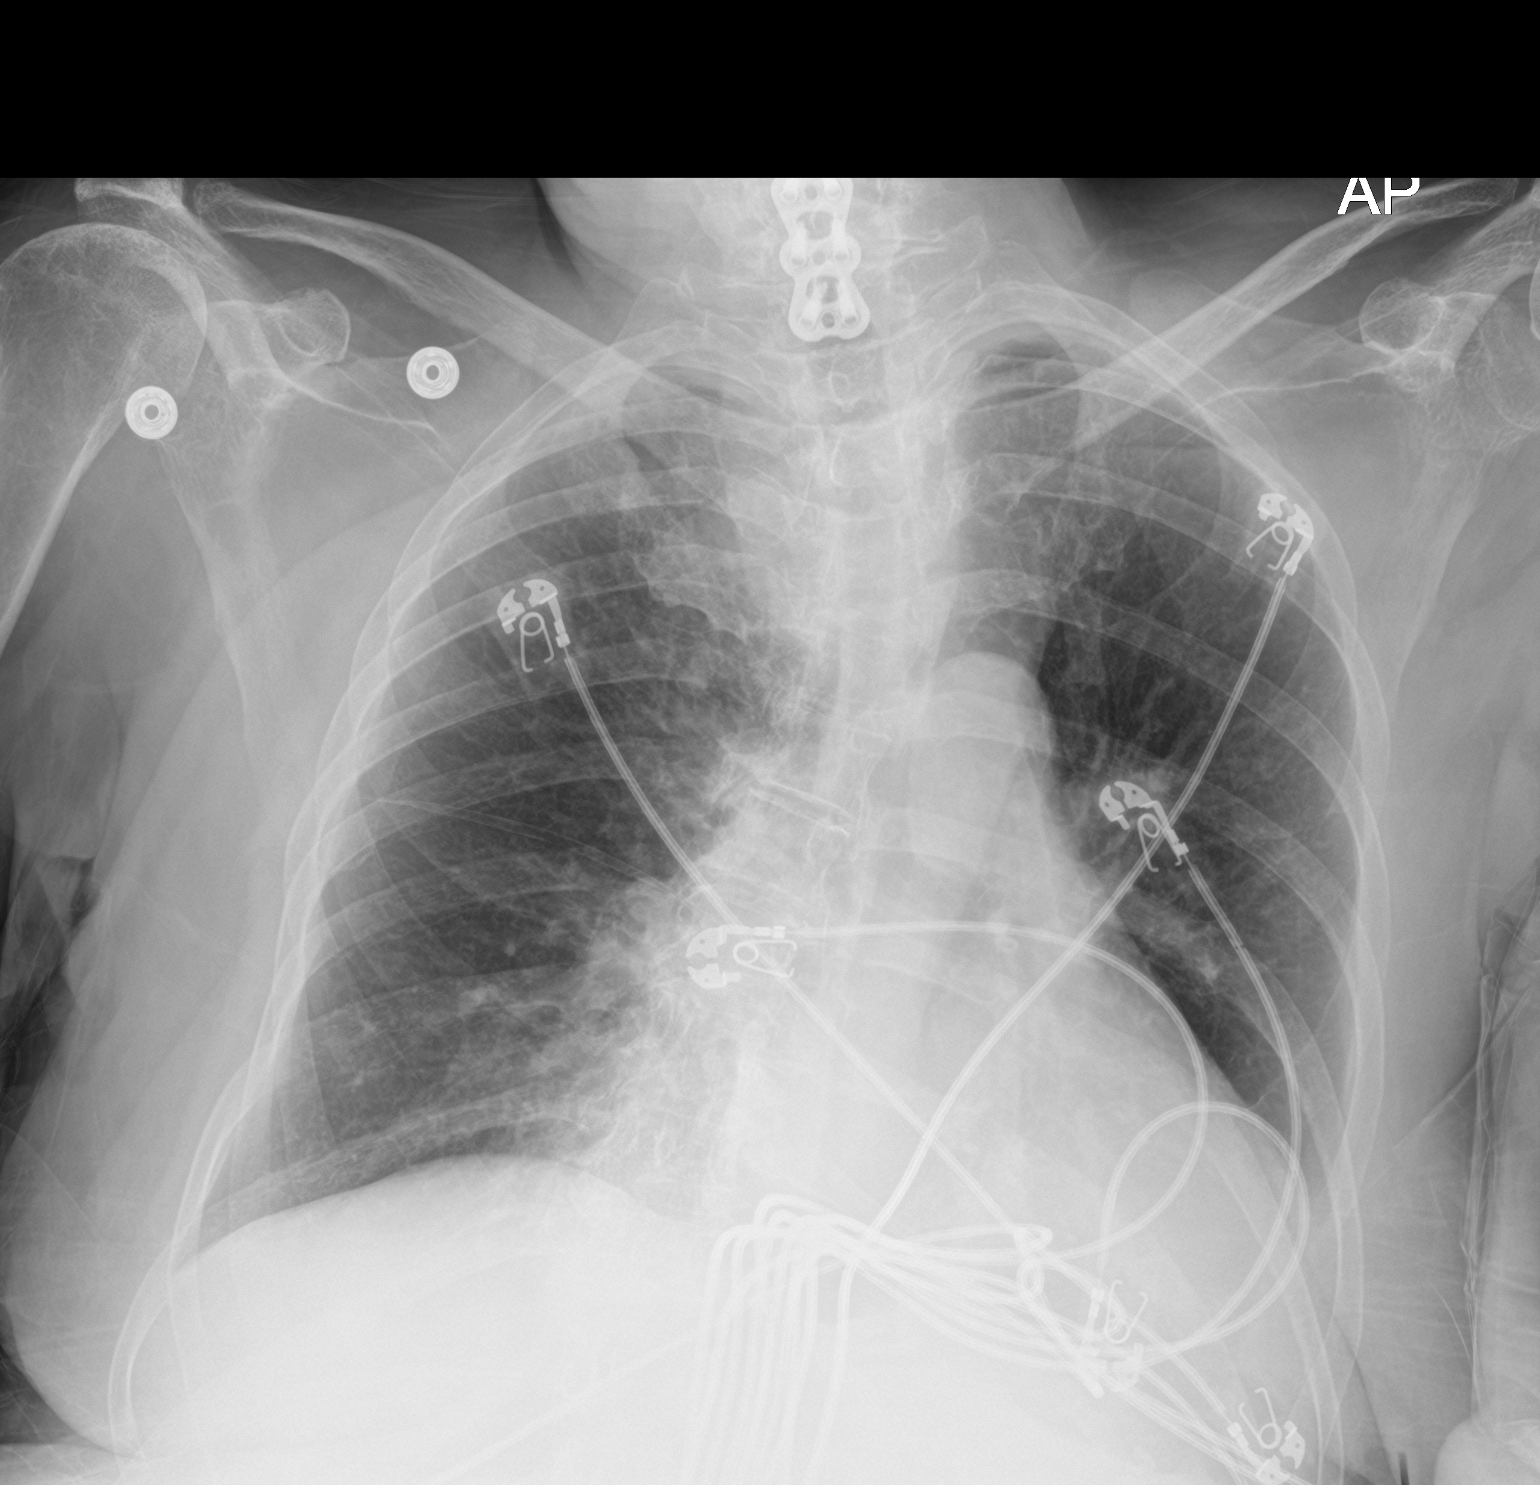

[2 of 2 positions shown; findings below may reference images not displayed]

FINDINGS: AP ERECT and LATERAL images were obtained. The patient was unable to
raise the LEFT arm due to her prior stroke, partially obscuring the
LATERAL image.

Cardiac silhouette moderately enlarged, unchanged. Thoracic aorta
mildly atherosclerotic, unchanged. Hilar and mediastinal contours
otherwise unremarkable. Since the examination yesterday, improved
aeration in the LEFT lung base without residual consolidation.
Prominent bronchovascular markings diffusely and moderate to marked
central peribronchial thickening, more so than on prior
examinations. Normal pulmonary vascularity. No visible pleural
effusions. Osseous demineralization. Degenerative changes throughout
the thoracic spine. Cervicothoracic levoscoliosis and thoracolumbar
dextroscoliosis is noted previously.
IMPRESSION: 1. Improved aeration in the LEFT lung base without convincing
evidence of residual consolidation.
2. Moderate to severe changes of acute bronchitis and/or asthma
without focal airspace pneumonia.
3. Stable cardiomegaly without pulmonary edema.

## 2019-08-27 ENCOUNTER — Other Ambulatory Visit: Payer: Self-pay | Admitting: Cardiovascular Disease

## 2019-08-27 DIAGNOSIS — I639 Cerebral infarction, unspecified: Secondary | ICD-10-CM

## 2019-08-28 DIAGNOSIS — Z993 Dependence on wheelchair: Secondary | ICD-10-CM | POA: Diagnosis not present

## 2019-09-21 DIAGNOSIS — I4891 Unspecified atrial fibrillation: Secondary | ICD-10-CM | POA: Diagnosis not present

## 2019-09-21 DIAGNOSIS — Z993 Dependence on wheelchair: Secondary | ICD-10-CM | POA: Diagnosis not present

## 2019-09-21 DIAGNOSIS — J449 Chronic obstructive pulmonary disease, unspecified: Secondary | ICD-10-CM | POA: Diagnosis not present

## 2019-09-27 DIAGNOSIS — Z993 Dependence on wheelchair: Secondary | ICD-10-CM | POA: Diagnosis not present

## 2019-10-08 DIAGNOSIS — I5042 Chronic combined systolic (congestive) and diastolic (congestive) heart failure: Secondary | ICD-10-CM | POA: Diagnosis not present

## 2019-10-08 DIAGNOSIS — I480 Paroxysmal atrial fibrillation: Secondary | ICD-10-CM | POA: Diagnosis not present

## 2019-10-08 DIAGNOSIS — D649 Anemia, unspecified: Secondary | ICD-10-CM | POA: Diagnosis not present

## 2019-10-08 DIAGNOSIS — I69952 Hemiplegia and hemiparesis following unspecified cerebrovascular disease affecting left dominant side: Secondary | ICD-10-CM | POA: Diagnosis not present

## 2019-10-08 DIAGNOSIS — Z931 Gastrostomy status: Secondary | ICD-10-CM | POA: Diagnosis not present

## 2019-10-08 DIAGNOSIS — N183 Chronic kidney disease, stage 3 unspecified: Secondary | ICD-10-CM | POA: Diagnosis not present

## 2019-10-21 DIAGNOSIS — Z993 Dependence on wheelchair: Secondary | ICD-10-CM | POA: Diagnosis not present

## 2019-10-21 DIAGNOSIS — J449 Chronic obstructive pulmonary disease, unspecified: Secondary | ICD-10-CM | POA: Diagnosis not present

## 2019-10-21 DIAGNOSIS — I4891 Unspecified atrial fibrillation: Secondary | ICD-10-CM | POA: Diagnosis not present

## 2019-10-28 DIAGNOSIS — Z993 Dependence on wheelchair: Secondary | ICD-10-CM | POA: Diagnosis not present

## 2019-11-12 ENCOUNTER — Other Ambulatory Visit: Payer: Self-pay

## 2019-11-12 ENCOUNTER — Ambulatory Visit: Payer: Medicare PPO | Admitting: Cardiovascular Disease

## 2019-11-12 MED ORDER — ISOSORBIDE MONONITRATE ER 30 MG PO TB24: ORAL_TABLET | ORAL | 2 refills | Status: DC

## 2019-11-12 NOTE — Telephone Encounter (Signed)
Refilled imdur to [facility]

## 2019-11-21 DIAGNOSIS — J449 Chronic obstructive pulmonary disease, unspecified: Secondary | ICD-10-CM | POA: Diagnosis not present

## 2019-11-21 DIAGNOSIS — I4891 Unspecified atrial fibrillation: Secondary | ICD-10-CM | POA: Diagnosis not present

## 2019-11-21 DIAGNOSIS — Z993 Dependence on wheelchair: Secondary | ICD-10-CM | POA: Diagnosis not present

## 2019-11-27 ENCOUNTER — Other Ambulatory Visit: Payer: Self-pay | Admitting: Student

## 2019-11-27 DIAGNOSIS — I639 Cerebral infarction, unspecified: Secondary | ICD-10-CM

## 2019-11-27 MED ORDER — ATORVASTATIN CALCIUM 10 MG PO TABS: ORAL_TABLET | ORAL | 2 refills | Status: DC

## 2019-11-28 DIAGNOSIS — Z993 Dependence on wheelchair: Secondary | ICD-10-CM | POA: Diagnosis not present

## 2019-11-29 ENCOUNTER — Other Ambulatory Visit: Payer: Self-pay

## 2019-11-29 ENCOUNTER — Ambulatory Visit (INDEPENDENT_AMBULATORY_CARE_PROVIDER_SITE_OTHER): Payer: Medicare PPO | Admitting: General Surgery

## 2019-11-29 ENCOUNTER — Encounter: Payer: Self-pay | Admitting: General Surgery

## 2019-11-29 VITALS — BP 116/79 | HR 73 | Temp 98.4°F | Resp 12

## 2019-11-29 DIAGNOSIS — K9423 Gastrostomy malfunction: Secondary | ICD-10-CM | POA: Diagnosis not present

## 2019-11-29 NOTE — Patient Instructions (Signed)
Stop Eliquis on 9/5.

## 2019-11-29 NOTE — Progress Notes (Signed)
Subjective:     Brandy [name]  Patient is a 79 year old black female status post PEG placement in 2020 who presents with dysfunction of the PEG.  I had seen her earlier this year and had trimmed up the gastrostomy tube.  It is starting to have difficulty flushing despite Coke being placed in it.  Patient is on Eliquis. Objective:    BP 116/79   Pulse 73   Temp 98.4 F (36.9 C) (Oral)   Resp 12   SpO2 94%   General:  alert, cooperative and no distress  Patient is in a wheelchair Head is normocephalic, atraumatic Lungs clear to auscultation with good breath sounds bilaterally Heart examination reveals distant heart sounds. Abdomen is soft.  Gastrostomy tube is warm with multiple areas of weakness of the tube.     Assessment:    PEG tube dysfunction    Plan:   Patient will be scheduled for PEG tube replacement on 12/04/2019.  The risks and benefits of the procedure were explained to the patient, who gave informed consent.  Patient will stop Eliquis 2 days prior to the procedure.

## 2019-11-29 NOTE — H&P (Signed)
   Brandy [name]  Patient is a 79 year old black female status post PEG placement in 2020 who presents with dysfunction of the PEG.  I had seen her earlier this year and had trimmed up the gastrostomy tube.  It is starting to have difficulty flushing despite Coke being placed in it.  Patient is on Eliquis. Objective:    BP 116/79   Pulse 73   Temp 98.4 F (36.9 C) (Oral)   Resp 12   SpO2 94%   General:  alert, cooperative and no distress  Patient is in a wheelchair Head is normocephalic, atraumatic Lungs clear to auscultation with good breath sounds bilaterally Heart examination reveals distant heart sounds. Abdomen is soft.  Gastrostomy tube is warm with multiple areas of weakness of the tube.     Assessment:    PEG tube dysfunction    Plan:   Patient will be scheduled for PEG tube replacement on 12/04/2019.  The risks and benefits of the procedure were explained to the patient, who gave informed consent.  Patient will stop Eliquis 2 days prior to the procedure.

## 2019-11-30 ENCOUNTER — Ambulatory Visit: Payer: Medicare PPO | Admitting: Cardiology

## 2019-12-04 ENCOUNTER — Ambulatory Visit (HOSPITAL_COMMUNITY)
Admission: RE | Admit: 2019-12-04 | Discharge: 2019-12-04 | Disposition: A | Discharge status: Home / Self Care | Payer: Medicare PPO | Attending: General Surgery | Admitting: General Surgery

## 2019-12-04 ENCOUNTER — Encounter (HOSPITAL_COMMUNITY): Payer: Self-pay | Admitting: Emergency Medicine

## 2019-12-04 ENCOUNTER — Encounter (HOSPITAL_COMMUNITY): Payer: Self-pay | Admitting: General Surgery

## 2019-12-04 ENCOUNTER — Emergency Department (HOSPITAL_COMMUNITY)
Admission: EM | Admit: 2019-12-04 | Discharge: 2019-12-05 | Disposition: A | Discharge status: Home / Self Care | Payer: Medicare PPO | Source: Home / Self Care | Attending: Emergency Medicine | Admitting: Emergency Medicine

## 2019-12-04 ENCOUNTER — Ambulatory Visit: Payer: Medicare PPO | Admitting: Cardiovascular Disease

## 2019-12-04 ENCOUNTER — Encounter (HOSPITAL_COMMUNITY)
Admission: RE | Disposition: A | Discharge status: Home / Self Care | Payer: Self-pay | Source: Home / Self Care | Attending: General Surgery

## 2019-12-04 ENCOUNTER — Other Ambulatory Visit: Payer: Self-pay

## 2019-12-04 DIAGNOSIS — I5042 Chronic combined systolic (congestive) and diastolic (congestive) heart failure: Secondary | ICD-10-CM | POA: Diagnosis not present

## 2019-12-04 DIAGNOSIS — I130 Hypertensive heart and chronic kidney disease with heart failure and stage 1 through stage 4 chronic kidney disease, or unspecified chronic kidney disease: Secondary | ICD-10-CM | POA: Diagnosis not present

## 2019-12-04 DIAGNOSIS — Z87891 Personal history of nicotine dependence: Secondary | ICD-10-CM | POA: Diagnosis not present

## 2019-12-04 DIAGNOSIS — K9423 Gastrostomy malfunction: Secondary | ICD-10-CM

## 2019-12-04 DIAGNOSIS — Z7901 Long term (current) use of anticoagulants: Secondary | ICD-10-CM | POA: Diagnosis not present

## 2019-12-04 DIAGNOSIS — N183 Chronic kidney disease, stage 3 unspecified: Secondary | ICD-10-CM | POA: Insufficient documentation

## 2019-12-04 DIAGNOSIS — Z4682 Encounter for fitting and adjustment of non-vascular catheter: Secondary | ICD-10-CM | POA: Diagnosis not present

## 2019-12-04 HISTORY — PX: PEG PLACEMENT: SHX5437

## 2019-12-04 SURGERY — INSERTION, PEG TUBE

## 2019-12-04 NOTE — Op Note (Signed)
Patient:  Brandy [name]  DOB:  02-Nov-1940  MRN:  [id number]   Preop Diagnosis: PEG tube malfunction  Postop Diagnosis: Same  Procedure: PEG tube replacement  Surgeon: Aviva Signs, MD  Anes: None  Indications: Patient is a 79 year old black female who presents for PEG tube replacement due to malfunction of the PEG tube.  The risks and benefits of the procedure were fully explained to the patient, who gave informed consent.  She stopped her Eliquis 2 days ago.  Procedure note: Patient was placed in supine position.  The old [place] PEG tube was removed with traction.  A 20 French gastrostomy tube with balloon was then replaced.  This was done without difficulty.  The bolster was placed at the 3 cm Katryna Tschirhart.  The balloon was filled with 6 cc of air.  50 cc of water were instilled and gastric contents were aspirated.  Patient tolerated the procedure well. Patient was discharged home from the endoscopy unit.  Complications: None  EBL: Minimal  Specimen: None

## 2019-12-04 NOTE — Interval H&P Note (Signed)
History and Physical Interval Note:  12/04/2019 8:56 AM  Brandy [name]  has presented today for surgery, with the diagnosis of Dysfunctional PEG.  The various methods of treatment have been discussed with the patient and family. After consideration of risks, benefits and other options for treatment, the patient has consented to  Procedure(s): PERCUTANEOUS ENDOSCOPIC GASTROSTOMY (PEG) REPLACEMENT (N/A) as a surgical intervention.  The patient's history has been reviewed, patient examined, no change in status, stable for surgery.  I have reviewed the patient's chart and labs.  Questions were answered to the patient's satisfaction.     Aviva Signs

## 2019-12-04 NOTE — ED Triage Notes (Signed)
Pt states she had procedure this morning to replace PEG tube. This afternoon the tube fell out of the incision.

## 2019-12-04 NOTE — Discharge Instructions (Signed)
PEG Tube Home Guide  A percutaneous endoscopic gastrostomy (PEG) tube is used to deliver food and fluids directly into the stomach. The tube has a clamp, a cap, and two anchors (bolsters). One bolster keeps the tube from coming out of the stomach. The other bolster holds the tube against the abdomen. You will be taught how to use and adjust your PEG tube before you leave the hospital. You will also be taught how to care for the opening (stoma) in your abdomen. Make sure that you understand:  How to care for your PEG tube.  How to care for your stoma.  How to give yourself feedings and medicines.  When to call your health care provider for help. Supplies needed:  Soapy water.  Clean, plain water.  Clean washcloth.  Bandage (dressing). This is optional.  Syringe. How to care for a PEG tube Check your PEG tube every day. Make sure:  It is not too tight. The bolster should rest gently over the stoma.  It is in the correct position. There is a Nayara Taplin on the tube that shows when it is in the correct position. Adjust the tube if you need to. Cleaning your stoma Clean your stoma every day. Follow these steps: 1. Wash your hands with soap and water. If soap and water are not available, use hand sanitizer. 2. Check the skin around the stoma for redness, rash, swelling, drainage, or extra tissue growth. If you notice any of these, call your health care provider. 3. Wash the stoma and the skin around it using a clean, soft washcloth. Clean using a circular motion, and wipe away from the stoma opening, not toward it. ? Use warm, soapy water, and only use cleansers recommended by your health care provider. ? Rinse the stoma area with plain water. ? Pat the stoma area dry. 4. Place a dressing over the stoma if your health care provider told you to do that.  Giving a feeding Your health care provider will give you instructions about:  How much nutrition and fluid you will need for each  feeding.  How often to have a feeding.  Whether to take medicine in the tube by itself or with a feeding. To give yourself a feeding, follow these steps: 1. Lay out all of the equipment that you will need. 2. Make sure that the nutritional formula is at room temperature. 3. Wash your hands with soap and water. 4. Position yourself so that you are upright. You will need to stay upright throughout the feeding and for at least 30 minutes after the feeding. 5. Make sure the syringe plunger is pushed in. Place the tip of the syringe in clean water, and slowly pull the plunger to bring (draw up) the water into the syringe. 6. Remove the clamp and the cap from the PEG tube. 7. Push the water out of the syringe to clean (flush) the tube. 8. If the tube is clear, draw up the formula into the syringe. Make sure to use the right amount for each feeding and add water if necessary. 9. Slowly push the formula from the syringe through the tube. 10. After the feeding, flush the tube with water. 11. Put the clamp and the cap on the tube. Giving medicine To give yourself medicine, follow these steps: 1. Lay out all of the equipment that you will need. 2. If your medicine is in tablet form, crush the tablet and dissolve it in water. 3. Wash your hands with soap   and water. 4. Position yourself so that you are upright. You will need to stay upright while you give yourself medicine and for at least 30 minutes afterward. 5. Make sure the syringe plunger is pushed in. Place the tip of the syringe in clean water, and slowly pull the plunger to bring (draw up) the water into the syringe. 6. Remove the clamp and the cap from the PEG tube. 7. Push the water out of the syringe to clean (flush) the tube. 8. If the tube is clear, draw up the medicine into the syringe. 9. Slowly push the medicine from the syringe through the tube. 10. Flush the tube with water. 11. Put the clamp and the cap on the tube. Do not take  sustained release (SR) medicines through your tube. If you are unsure if your medicine is an SR medicine, ask your health care provider or pharmacist. Contact a health care provider if you have:  Soreness, redness, or irritation around your stoma.  Abdominal pain or bloating during or after your feedings.  Nausea, constipation, or diarrhea that will not go away.  A fever.  Problems with your PEG tube. Get help right away if:  Your tube is blocked.  Your tube falls out.  You have pain around your stoma.  You are bleeding from your stoma.  Your tube is leaking.  You choke or you have trouble breathing during or after a feeding. Summary  A percutaneous endoscopic gastrostomy (PEG) tube is used to deliver food and fluids directly into the stomach.  You will be taught how to use and adjust your PEG tube. You will also be taught how to care for the stoma in your abdomen.  Your health care provider will give you instructions on how to give yourself nutritional formula and medicines through your PEG tube.  Contact your health care provider if you have a fever or soreness, redness, or irritation around your stoma.  Get help right away if your tube leaks, is blocked, or falls out. Get help right away if you have pain or bleeding around your stoma. This information is not intended to replace advice given to you by your health care provider. Make sure you discuss any questions you have with your health care provider. Document Revised: 06/01/2018 Document Reviewed: 03/28/2017 Elsevier Patient Education  2020 [employer].

## 2019-12-05 ENCOUNTER — Emergency Department (HOSPITAL_COMMUNITY): Payer: Medicare PPO

## 2019-12-05 DIAGNOSIS — Z4682 Encounter for fitting and adjustment of non-vascular catheter: Secondary | ICD-10-CM | POA: Diagnosis not present

## 2019-12-05 MED ORDER — IOHEXOL 180 MG/ML  SOLN
50.0000 mL | Freq: Once | INTRAMUSCULAR | Status: AC
  Administered 2019-12-05: 50 mL

## 2019-12-05 MED ORDER — IOHEXOL 300 MG/ML  SOLN: 50.0000 mL | Freq: Once | INTRAMUSCULAR | Status: AC | PRN

## 2019-12-05 NOTE — Discharge Instructions (Signed)
Your G-tube fell out but was replaced here in the ED. The tube is in good position and ready to use. Follow up with Dr. Arnoldo Morale by phone and see when he will need to see you next in the office. Return to the ED with any new or worsening symptoms.

## 2019-12-05 NOTE — ED Provider Notes (Signed)
Emergency Department Provider Note   I have reviewed the triage vital signs and the nursing notes.   HISTORY  Chief Complaint Feeding Tube Out   HPI Brandy [name] is a 79 y.o. female presents to the ED with a dislodged g-tube. The tube was replaced yesterday by Dr. Arnoldo Morale in the endoscopy suite without complication. The husband reports that the tube became dislodged this afternoon and was found outside of the stoma. Patient denies pain. She does take some in PO but relies on the tube heavily. He anticoagulation was held due to the procedure yesterday. No vomiting, abdominal pain, or fever.   Past Medical History:  Diagnosis Date  . Cardiomyopathy    a. EF of 25% in 07/2006 b. EF normalized by repeat echo in 2011 c. EF 35-40% by echo in 05/2017 with cath showing mild nonobstructive CAD  . Chronic combined systolic and diastolic CHF (congestive heart failure) ([place])   . Colon polyps   . Diverticulosis of colon   . Hypertension   . Hypertensive heart disease   . Osteopenia   . Paroxysmal atrial fibrillation ([place]) 10/10/2018  . Stroke ([place])   . TIA (transient ischemic attack)   . Tobacco abuse    50 pack year    Patient Active Problem List   Diagnosis Date Noted  . Malfunction of percutaneous endoscopic gastrostomy (PEG) tube ([place])   . Protein-calorie malnutrition, severe 10/12/2018  . Failure to thrive in adult 10/11/2018  . Acute renal failure superimposed on stage 3 chronic kidney disease ([place]) 10/11/2018  . Palliative care by specialist   . Acute prerenal azotemia 10/10/2018  . Hypernatremia 10/10/2018  . AKI (acute kidney injury) ([place]) 10/10/2018  . Paroxysmal atrial fibrillation ([place]) 10/10/2018  . Abnormality of gait 05/04/2018  . Acute exacerbation of congestive heart failure ([place]) 03/28/2018  . Left hemiplegia ([place])   . Chronic kidney disease   . Hypokalemia   . Acute on chronic anemia   . Hypoalbuminemia due to protein-calorie malnutrition ([place])   .  Hemiparesis affecting left side as late effect of stroke ([place]) 02/16/2018  . Left-sided neglect 02/16/2018  . Right middle cerebral artery stroke ([place]) 02/16/2018  . Acute embolic stroke ([place])   . Acute right MCA stroke ([place])   . History of CVA with residual deficit   . Benign essential HTN   . Chronic combined systolic and diastolic congestive heart failure ([place])   . History of fusion of cervical spine   . Stage 3 chronic kidney disease   . Acute blood loss anemia   . Stroke ([place]) 02/10/2018  . Middle cerebral artery embolism, right 02/10/2018  . Goals of care, counseling/discussion 11/01/2017  . Non-ischemic cardiomyopathy ([place]) 09/14/2017  . Precordial chest pain 09/14/2017  . B12 deficiency 09/13/2017  . Tachycardia 09/13/2017  . Acute ischemic stroke ([place])   . Seasonal allergies 07/13/2017  . TIA (transient ischemic attack) 07/12/2017  . Anemia 07/12/2017  . Pneumonia 06/02/2017  . Pleural effusion 06/02/2017  . Acute on chronic systolic congestive heart failure ([place])   . Elevated troponin   . Other fatigue 05/04/2017  . Dyspnea 05/04/2017  . Pseudophakia of both eyes 08/06/2016  . Chronic combined systolic and diastolic CHF (congestive heart failure) ([place]) 07/09/2016  . Hypertensive heart disease   . Hypertension   . Nuclear sclerosis, bilateral 05/24/2016  . HTN (hypertension) 01/22/2013  . TOBACCO ABUSE 02/18/2008  . CARDIOMYOPATHY 02/18/2008  . Diverticulosis of colon 02/18/2008  . OSTEOPENIA 02/18/2008  .  Disorder of bone and cartilage 02/18/2008    Past Surgical History:  Procedure Laterality Date  . BREAST BIOPSY    . CARPAL TUNNEL RELEASE     right hand  . CERVICAL FUSION    . CESAREAN SECTION     2 times  . COLONOSCOPY  0932   pt uncertain as to whether polypectomy required or performed  . ECTOPIC PREGNANCY SURGERY    . ESOPHAGOGASTRODUODENOSCOPY (EGD) WITH PROPOFOL N/A 10/13/2018   Procedure: ESOPHAGOGASTRODUODENOSCOPY (EGD) WITH PROPOFOL;  Surgeon:  Aviva Signs, MD;  Location: [facility];  Service: General;  Laterality: N/A;  . IR ANGIO VERTEBRAL SEL SUBCLAVIAN INNOMINATE UNI R MOD SED  02/10/2018  . IR CT HEAD LTD  02/10/2018  . IR PERCUTANEOUS ART THROMBECTOMY/INFUSION INTRACRANIAL INC DIAG ANGIO  02/10/2018  . PEG PLACEMENT N/A 10/13/2018   Procedure: PERCUTANEOUS ENDOSCOPIC GASTROSTOMY (PEG) PLACEMENT;  Surgeon: Aviva Signs, MD;  Location: [facility];  Service: General;  Laterality: N/A;  . RADIOLOGY WITH ANESTHESIA N/A 02/10/2018   Procedure: RADIOLOGY WITH ANESTHESIA;  Surgeon: Luanne Bras, MD;  Location: [place];  Service: Radiology;  Laterality: N/A;  . RIGHT/LEFT HEART CATH AND CORONARY ANGIOGRAPHY N/A 06/03/2017   Procedure: RIGHT/LEFT HEART CATH AND CORONARY ANGIOGRAPHY;  Surgeon: Jettie Booze, MD;  Location: [place] [facility];  Service: Cardiovascular;  Laterality: N/A;  . TEE WITHOUT CARDIOVERSION N/A 02/13/2018   Procedure: TRANSESOPHAGEAL ECHOCARDIOGRAM (TEE);  Surgeon: Sueanne Margarita, MD;  Location: [facility] ENDOSCOPY;  Service: Cardiovascular;  Laterality: N/A;  . TONSILLECTOMY      Allergies Lisinopril  Family History  Problem Relation Age of Onset  . Hypertension Brother   . Hypertension Maternal Grandmother   . Cancer Paternal Grandmother     Social History Social History   Tobacco Use  . Smoking status: Former Smoker    Packs/day: 1.00    Years: 50.00    Pack years: 50.00    Types: Cigarettes    Start date: 09/02/1960    Quit date: 03/29/2008    Years since quitting: 11.6  . Smokeless tobacco: Never Used  Vaping Use  . Vaping Use: Never used  Substance Use Topics  . Alcohol use: No    Alcohol/week: 0.0 standard drinks  . Drug use: Never    Review of Systems  Constitutional: No fever/chills Cardiovascular: Denies chest pain. Respiratory: Denies shortness of breath. Gastrointestinal: No abdominal pain.  No nausea, no vomiting.  No diarrhea.  No constipation. PEG tube dislodged.    Musculoskeletal: Negative for back pain. Skin: Negative for rash. Neurological: Negative for headaches.  10-point ROS otherwise negative.  ____________________________________________   PHYSICAL EXAM:  VITAL SIGNS: ED Triage Vitals  Enc Vitals Group     BP 12/04/19 1940 125/75     Pulse Rate 12/04/19 1940 63     Resp 12/04/19 1940 18     Temp 12/04/19 1940 98.4 F (36.9 C)     Temp Source 12/04/19 1940 Oral     SpO2 12/04/19 1940 98 %     Weight 12/04/19 1940 124 lb (56.2 kg)     Height 12/04/19 1940 5\' 4"  (1.626 m)   Constitutional: Alert and oriented. Well appearing and in no acute distress. Eyes: Conjunctivae are normal.  Head: Atraumatic. Nose: No congestion/rhinnorhea. Mouth/Throat: Mucous membranes are moist.  Neck: No stridor.   Cardiovascular: Normal rate, regular rhythm. Good peripheral circulation. Grossly normal heart sounds.   Respiratory: Normal respiratory effort.  No retractions. Lungs CTAB. Gastrointestinal: Soft and nontender.  Stoma in the LUQ without active bleeding.  Musculoskeletal: No gross deformities of extremities. Neurologic:  Normal speech and language.  Skin:  Skin is warm, dry and intact. No rash noted.  ____________________________________________  RADIOLOGY  DG ABDOMEN PEG TUBE LOCATION  Result Date: 12/05/2019 CLINICAL DATA:  Replaced PEG tube this morning. Tube fell out of the incision this afternoon. EXAM: ABDOMEN - 1 VIEW COMPARISON:  None. FINDINGS: 50 mL Omnipaque 300 was injected into the gastrostomy tube. Subsequent images obtained demonstrate gastrostomy tube with retention balloon localized in the upper stomach. Contrast material is demonstrated within the stomach suggesting patency of the tube with good position. No contrast extravasation is demonstrated. Visualized bowel gas pattern is normal without small bowel or large bowel distention. IMPRESSION: Gastrostomy tube appears to be in good position. No contrast extravasation is  demonstrated. Electronically Signed   By: Lucienne Capers M.D.   On: 12/05/2019 01:01    ____________________________________________   PROCEDURES  Procedure(s) performed:   Gastrostomy tube replacement  Date/Time: 12/05/2019 1:15 AM Performed by: Margette Fast, MD Authorized by: Margette Fast, MD  Preparation: Patient was prepped and draped in the usual sterile fashion. Local anesthesia used: no  Anesthesia: Local anesthesia used: no  Sedation: Patient sedated: no  Patient tolerance: patient tolerated the procedure well with no immediate complications Comments: 01S G tube placed through the stoma under gentle, steady pressure. The tube passed without complication and minimal pain. Gastric contents aspirated. No significant bleeding. 6 ml of air was passed into the balloon without pain. KUB with contrast confirmed placement. Tape and dressing applied.       ____________________________________________   INITIAL IMPRESSION / ASSESSMENT AND PLAN / ED COURSE  Pertinent labs & imaging results that were available during my care of the patient were reviewed by me and considered in my medical decision making (see chart for details).   Patient presents to the ED with dislodged PEG tube. New tube was secured and placed per note above. No complications. KUB reviewed. Patient ok to start using tube and f/u with PCP and Dr. Arnoldo Morale PRN.    ____________________________________________  FINAL CLINICAL IMPRESSION(S) / ED DIAGNOSES  Final diagnoses:  PEG tube malfunction ([place])    MEDICATIONS GIVEN DURING THIS VISIT:  Medications  iohexol (OMNIPAQUE) 180 MG/ML injection 50 mL (50 mLs Per Tube Contrast Given 12/05/19 0055)    Note:  This document was prepared using Dragon voice recognition software and may include unintentional dictation errors.  Nanda Quinton, MD, [facility] Emergency Medicine    Rylen Swindler, [facility], MD 12/05/19 [id number]

## 2019-12-06 ENCOUNTER — Encounter (HOSPITAL_COMMUNITY): Payer: Self-pay | Admitting: General Surgery

## 2019-12-11 ENCOUNTER — Other Ambulatory Visit: Payer: Self-pay

## 2019-12-11 ENCOUNTER — Ambulatory Visit (INDEPENDENT_AMBULATORY_CARE_PROVIDER_SITE_OTHER): Payer: Medicare PPO | Admitting: General Surgery

## 2019-12-11 ENCOUNTER — Ambulatory Visit: Payer: Medicare PPO | Admitting: General Surgery

## 2019-12-11 ENCOUNTER — Encounter: Payer: Self-pay | Admitting: General Surgery

## 2019-12-11 VITALS — BP 123/79 | HR 65 | Temp 98.3°F | Resp 12

## 2019-12-11 DIAGNOSIS — Z09 Encounter for follow-up examination after completed treatment for conditions other than malignant neoplasm: Secondary | ICD-10-CM

## 2019-12-11 NOTE — Progress Notes (Signed)
Subjective:     Brandy [name]  Presented back for follow-up from the emergency room.  States the PEG tube is loose. Objective:    BP 123/79   Pulse 65   Temp 98.3 F (36.8 C) (Oral)   Resp 12   SpO2 (!) 89%   General:  alert, cooperative and no distress  PEG tube bolster was not tightened in the emergency room.  Tightened to the 4 cm Brandy [name].  6 cc of air instilled into the balloon.     Assessment:    Realignment of gastrostomy tube.    Plan:   Instructions given to caregiver on how to reinsufflate the balloon should it drop once again.  Follow-up here as needed.

## 2019-12-12 ENCOUNTER — Other Ambulatory Visit (INDEPENDENT_AMBULATORY_CARE_PROVIDER_SITE_OTHER): Payer: Medicare PPO | Admitting: General Surgery

## 2019-12-12 ENCOUNTER — Encounter (HOSPITAL_COMMUNITY)
Admission: AD | Disposition: A | Discharge status: Home / Self Care | Payer: Self-pay | Source: Ambulatory Visit | Attending: General Surgery

## 2019-12-12 ENCOUNTER — Ambulatory Visit (HOSPITAL_COMMUNITY)
Admission: AD | Admit: 2019-12-12 | Discharge: 2019-12-12 | Disposition: A | Discharge status: Home / Self Care | Payer: Medicare PPO | Source: Ambulatory Visit | Attending: General Surgery | Admitting: General Surgery

## 2019-12-12 DIAGNOSIS — Z09 Encounter for follow-up examination after completed treatment for conditions other than malignant neoplasm: Secondary | ICD-10-CM

## 2019-12-12 DIAGNOSIS — Z888 Allergy status to other drugs, medicaments and biological substances status: Secondary | ICD-10-CM | POA: Insufficient documentation

## 2019-12-12 DIAGNOSIS — K5790 Diverticulosis of intestine, part unspecified, without perforation or abscess without bleeding: Secondary | ICD-10-CM | POA: Diagnosis not present

## 2019-12-12 DIAGNOSIS — K9423 Gastrostomy malfunction: Secondary | ICD-10-CM | POA: Diagnosis not present

## 2019-12-12 DIAGNOSIS — Z981 Arthrodesis status: Secondary | ICD-10-CM | POA: Insufficient documentation

## 2019-12-12 DIAGNOSIS — I5042 Chronic combined systolic (congestive) and diastolic (congestive) heart failure: Secondary | ICD-10-CM | POA: Insufficient documentation

## 2019-12-12 DIAGNOSIS — Z8673 Personal history of transient ischemic attack (TIA), and cerebral infarction without residual deficits: Secondary | ICD-10-CM | POA: Insufficient documentation

## 2019-12-12 DIAGNOSIS — Z809 Family history of malignant neoplasm, unspecified: Secondary | ICD-10-CM | POA: Diagnosis not present

## 2019-12-12 DIAGNOSIS — Z86010 Personal history of colonic polyps: Secondary | ICD-10-CM | POA: Diagnosis not present

## 2019-12-12 DIAGNOSIS — I110 Hypertensive heart disease with heart failure: Secondary | ICD-10-CM | POA: Diagnosis not present

## 2019-12-12 DIAGNOSIS — I480 Paroxysmal atrial fibrillation: Secondary | ICD-10-CM | POA: Insufficient documentation

## 2019-12-12 DIAGNOSIS — M8580 Other specified disorders of bone density and structure, unspecified site: Secondary | ICD-10-CM | POA: Diagnosis not present

## 2019-12-12 DIAGNOSIS — Z87891 Personal history of nicotine dependence: Secondary | ICD-10-CM | POA: Insufficient documentation

## 2019-12-12 DIAGNOSIS — Z431 Encounter for attention to gastrostomy: Secondary | ICD-10-CM | POA: Insufficient documentation

## 2019-12-12 DIAGNOSIS — I2510 Atherosclerotic heart disease of native coronary artery without angina pectoris: Secondary | ICD-10-CM | POA: Insufficient documentation

## 2019-12-12 DIAGNOSIS — Z8249 Family history of ischemic heart disease and other diseases of the circulatory system: Secondary | ICD-10-CM | POA: Insufficient documentation

## 2019-12-12 HISTORY — PX: PEG PLACEMENT: SHX5437

## 2019-12-12 SURGERY — REPLACEMENT, PEG TUBE, WITHOUT ENDOSCOPY

## 2019-12-12 NOTE — H&P (View-Only) (Signed)
To come to Endoscopy for PEG tube replacement

## 2019-12-12 NOTE — Progress Notes (Signed)
To come to Endoscopy for PEG tube replacement

## 2019-12-12 NOTE — Op Note (Addendum)
Patient:  ROSHANDA BALAZS  DOB:  [birth year]  MRN:  [id number]   Preop Diagnosis: PEG tube malfunction  Postop Diagnosis: Same  Procedure: Gastrostomy tube replacement  Surgeon: Aviva Signs, MD  Anes: None  Indications: Patient is a 79 year old black female who recently had a gastrostomy tube replaced and this morning, the home health nurse found that found that the tube had fallen out.  The patient now presents to the endoscopy suite for replacement of the gastrostomy tube.  Risks and benefits of the procedure were fully explained to the patient, who gave informed consent.  Procedure note: The patient was left in her reclining wheelchair.  Surgical site confirmation was performed.  I initially attempted to replace the tube with a 20 [place] and 18 [place] gastrostomy tubes, but was unable to do that.  Thus, a 87 French gastrostomy tube was placed.  6 cc of saline were instilled into the balloon.  Gastric contents were aspirated, confirming placement.  The bolster was placed at the 4 cm Shari Natt.  The patient tolerated the procedure well.  A dry sterile dressing was applied.  The patient was discharged from endoscopy in stable condition.  Complications: None  EBL: Minimal  Specimen: None

## 2019-12-12 NOTE — Interval H&P Note (Signed)
History and Physical Interval Note:  12/12/2019 10:43 AM  Brandy [name]  has presented today for surgery, with the diagnosis of Dysfunctional PEG.  The various methods of treatment have been discussed with the patient and family. After consideration of risks, benefits and other options for treatment, the patient has consented to  Procedure(s): PERCUTANEOUS ENDOSCOPIC GASTROSTOMY (PEG) REPLACEMENT (N/A) as a surgical intervention.  The patient's history has been reviewed, patient examined, no change in status, stable for surgery.  I have reviewed the patient's chart and labs.  Questions were answered to the patient's satisfaction.     Aviva Signs

## 2019-12-14 ENCOUNTER — Encounter (HOSPITAL_COMMUNITY): Payer: Self-pay | Admitting: General Surgery

## 2019-12-17 NOTE — H&P (Signed)
Brandy [name] is an 79 y.o. female.   Chief Complaint: Malfunction of PEG tube HPI: Patient is a 79 year old black female who was found overnight to have displacement over gastrostomy tube.  Her nurse caregiver found that it had been removed.  The patient now comes to the endoscopy suite for replacement of the PEG tube.  Past Medical History:  Diagnosis Date  . Cardiomyopathy    a. EF of 25% in 07/2006 b. EF normalized by repeat echo in 2011 c. EF 35-40% by echo in 05/2017 with cath showing mild nonobstructive CAD  . Chronic combined systolic and diastolic CHF (congestive heart failure) ([place])   . Colon polyps   . Diverticulosis of colon   . Hypertension   . Hypertensive heart disease   . Osteopenia   . Paroxysmal atrial fibrillation ([place]) 10/10/2018  . Stroke ([place])   . TIA (transient ischemic attack)   . Tobacco abuse    50 pack year    Past Surgical History:  Procedure Laterality Date  . BREAST BIOPSY    . CARPAL TUNNEL RELEASE     right hand  . CERVICAL FUSION    . CESAREAN SECTION     2 times  . COLONOSCOPY  1017   pt uncertain as to whether polypectomy required or performed  . ECTOPIC PREGNANCY SURGERY    . ESOPHAGOGASTRODUODENOSCOPY (EGD) WITH PROPOFOL N/A 10/13/2018   Procedure: ESOPHAGOGASTRODUODENOSCOPY (EGD) WITH PROPOFOL;  Surgeon: Aviva Signs, MD;  Location: [facility];  Service: General;  Laterality: N/A;  . IR ANGIO VERTEBRAL SEL SUBCLAVIAN INNOMINATE UNI R MOD SED  02/10/2018  . IR CT HEAD LTD  02/10/2018  . IR PERCUTANEOUS ART THROMBECTOMY/INFUSION INTRACRANIAL INC DIAG ANGIO  02/10/2018  . PEG PLACEMENT N/A 10/13/2018   Procedure: PERCUTANEOUS ENDOSCOPIC GASTROSTOMY (PEG) PLACEMENT;  Surgeon: Aviva Signs, MD;  Location: [facility];  Service: General;  Laterality: N/A;  . PEG PLACEMENT N/A 12/04/2019   Procedure: PERCUTANEOUS ENDOSCOPIC GASTROSTOMY (PEG) REPLACEMENT;  Surgeon: Aviva Signs, MD;  [facility];  Service: Gastroenterology;  Laterality: N/A;   . PEG PLACEMENT N/A 12/12/2019   Procedure: PERCUTANEOUS ENDOSCOPIC GASTROSTOMY (PEG) REPLACEMENT;  Surgeon: Aviva Signs, MD;  [facility];  Service: Gastroenterology;  Laterality: N/A;  . RADIOLOGY WITH ANESTHESIA N/A 02/10/2018   Procedure: RADIOLOGY WITH ANESTHESIA;  Surgeon: Luanne Bras, MD;  Location: [place];  Service: Radiology;  Laterality: N/A;  . RIGHT/LEFT HEART CATH AND CORONARY ANGIOGRAPHY N/A 06/03/2017   Procedure: RIGHT/LEFT HEART CATH AND CORONARY ANGIOGRAPHY;  Surgeon: Jettie Booze, MD;  Location: [place] CV LAB;  Service: Cardiovascular;  Laterality: N/A;  . TEE WITHOUT CARDIOVERSION N/A 02/13/2018   Procedure: TRANSESOPHAGEAL ECHOCARDIOGRAM (TEE);  Surgeon: Sueanne Margarita, MD;  Location: [facility] ENDOSCOPY;  Service: Cardiovascular;  Laterality: N/A;  . TONSILLECTOMY      Family History  Problem Relation Age of Onset  . Hypertension Brother   . Hypertension Maternal Grandmother   . Cancer Paternal Grandmother    Social History:  reports that she quit smoking about 11 years ago. Her smoking use included cigarettes. She started smoking about 59 years ago. She has a 50.00 pack-year smoking history. She has never used smokeless tobacco. She reports that she does not drink alcohol and does not use drugs.  Allergies:  Allergies  Allergen Reactions  . Lisinopril Swelling    No medications prior to admission.    No results found for this or any previous visit (from the past 48 hour(s)).  No results found.  Review of Systems  Unable to perform ROS: Other    There were no vitals taken for this visit. Physical Exam Vitals reviewed.  Constitutional:      Appearance: Normal appearance.  HENT:     Head: Normocephalic and atraumatic.  Cardiovascular:     Rate and Rhythm: Normal rate and regular rhythm.     Heart sounds: Normal heart sounds. No murmur heard.  No friction rub. No gallop.   Pulmonary:     Effort: Pulmonary effort is normal. No  respiratory distress.     Breath sounds: No stridor. No wheezing, rhonchi or rales.  Abdominal:     General: There is no distension.     Palpations: Abdomen is soft. There is no mass.     Tenderness: There is no abdominal tenderness. There is no guarding or rebound.     Hernia: No hernia is present.     Comments:   PEG site without significant bleeding.  Present in the left upper quadrant of the abdomen.  Skin:    General: Skin is warm and dry.  Neurological:     Mental Status: She is alert and oriented to person, place, and time.      Assessment/Plan Impression: Accidental removal of the gastrostomy tube Plan: Patient will undergo replacement of her PEG tube in the endoscopy suite.  The risks and benefits of the procedure were fully explained to the patient, who gave informed consent.  Aviva Signs, MD 12/17/2019, 8:56 AM

## 2019-12-21 ENCOUNTER — Ambulatory Visit: Payer: Medicare PPO | Admitting: Cardiovascular Disease

## 2019-12-22 DIAGNOSIS — Z993 Dependence on wheelchair: Secondary | ICD-10-CM | POA: Diagnosis not present

## 2019-12-22 DIAGNOSIS — J449 Chronic obstructive pulmonary disease, unspecified: Secondary | ICD-10-CM | POA: Diagnosis not present

## 2019-12-22 DIAGNOSIS — I4891 Unspecified atrial fibrillation: Secondary | ICD-10-CM | POA: Diagnosis not present

## 2019-12-28 DIAGNOSIS — Z993 Dependence on wheelchair: Secondary | ICD-10-CM | POA: Diagnosis not present

## 2020-01-08 DIAGNOSIS — Z0000 Encounter for general adult medical examination without abnormal findings: Secondary | ICD-10-CM | POA: Diagnosis not present

## 2020-01-08 DIAGNOSIS — I69952 Hemiplegia and hemiparesis following unspecified cerebrovascular disease affecting left dominant side: Secondary | ICD-10-CM | POA: Diagnosis not present

## 2020-01-08 DIAGNOSIS — I400 Infective myocarditis: Secondary | ICD-10-CM | POA: Diagnosis not present

## 2020-01-08 DIAGNOSIS — N183 Chronic kidney disease, stage 3 unspecified: Secondary | ICD-10-CM | POA: Diagnosis not present

## 2020-01-15 DIAGNOSIS — I517 Cardiomegaly: Secondary | ICD-10-CM | POA: Diagnosis not present

## 2020-01-15 DIAGNOSIS — J984 Other disorders of lung: Secondary | ICD-10-CM | POA: Diagnosis not present

## 2020-01-21 ENCOUNTER — Inpatient Hospital Stay (HOSPITAL_COMMUNITY)
Admission: EM | Admit: 2020-01-21 | Discharge: 2020-01-24 | DRG: 377 | Disposition: A | Discharge status: Home Health | Payer: Medicare PPO | Attending: Family Medicine | Admitting: Family Medicine

## 2020-01-21 ENCOUNTER — Other Ambulatory Visit: Payer: Self-pay

## 2020-01-21 ENCOUNTER — Emergency Department (HOSPITAL_COMMUNITY): Payer: Medicare PPO

## 2020-01-21 ENCOUNTER — Encounter (HOSPITAL_COMMUNITY): Payer: Self-pay | Admitting: Emergency Medicine

## 2020-01-21 DIAGNOSIS — R0902 Hypoxemia: Secondary | ICD-10-CM | POA: Diagnosis present

## 2020-01-21 DIAGNOSIS — I5042 Chronic combined systolic (congestive) and diastolic (congestive) heart failure: Secondary | ICD-10-CM | POA: Diagnosis not present

## 2020-01-21 DIAGNOSIS — K922 Gastrointestinal hemorrhage, unspecified: Secondary | ICD-10-CM | POA: Diagnosis not present

## 2020-01-21 DIAGNOSIS — Z7951 Long term (current) use of inhaled steroids: Secondary | ICD-10-CM | POA: Diagnosis not present

## 2020-01-21 DIAGNOSIS — N1832 Chronic kidney disease, stage 3b: Secondary | ICD-10-CM | POA: Diagnosis not present

## 2020-01-21 DIAGNOSIS — I517 Cardiomegaly: Secondary | ICD-10-CM | POA: Diagnosis not present

## 2020-01-21 DIAGNOSIS — I480 Paroxysmal atrial fibrillation: Secondary | ICD-10-CM

## 2020-01-21 DIAGNOSIS — K3189 Other diseases of stomach and duodenum: Secondary | ICD-10-CM | POA: Diagnosis not present

## 2020-01-21 DIAGNOSIS — Z87891 Personal history of nicotine dependence: Secondary | ICD-10-CM | POA: Diagnosis not present

## 2020-01-21 DIAGNOSIS — Z931 Gastrostomy status: Secondary | ICD-10-CM | POA: Diagnosis not present

## 2020-01-21 DIAGNOSIS — K2960 Other gastritis without bleeding: Secondary | ICD-10-CM | POA: Diagnosis not present

## 2020-01-21 DIAGNOSIS — Z8249 Family history of ischemic heart disease and other diseases of the circulatory system: Secondary | ICD-10-CM | POA: Diagnosis not present

## 2020-01-21 DIAGNOSIS — Z79899 Other long term (current) drug therapy: Secondary | ICD-10-CM

## 2020-01-21 DIAGNOSIS — J189 Pneumonia, unspecified organism: Secondary | ICD-10-CM | POA: Diagnosis not present

## 2020-01-21 DIAGNOSIS — I2510 Atherosclerotic heart disease of native coronary artery without angina pectoris: Secondary | ICD-10-CM | POA: Diagnosis present

## 2020-01-21 DIAGNOSIS — I482 Chronic atrial fibrillation, unspecified: Secondary | ICD-10-CM | POA: Diagnosis not present

## 2020-01-21 DIAGNOSIS — M8580 Other specified disorders of bone density and structure, unspecified site: Secondary | ICD-10-CM | POA: Diagnosis present

## 2020-01-21 DIAGNOSIS — I69354 Hemiplegia and hemiparesis following cerebral infarction affecting left non-dominant side: Secondary | ICD-10-CM | POA: Diagnosis not present

## 2020-01-21 DIAGNOSIS — Z993 Dependence on wheelchair: Secondary | ICD-10-CM

## 2020-01-21 DIAGNOSIS — Z7901 Long term (current) use of anticoagulants: Secondary | ICD-10-CM

## 2020-01-21 DIAGNOSIS — E785 Hyperlipidemia, unspecified: Secondary | ICD-10-CM | POA: Diagnosis present

## 2020-01-21 DIAGNOSIS — I509 Heart failure, unspecified: Secondary | ICD-10-CM | POA: Diagnosis not present

## 2020-01-21 DIAGNOSIS — Z809 Family history of malignant neoplasm, unspecified: Secondary | ICD-10-CM

## 2020-01-21 DIAGNOSIS — I10 Essential (primary) hypertension: Secondary | ICD-10-CM | POA: Diagnosis not present

## 2020-01-21 DIAGNOSIS — D123 Benign neoplasm of transverse colon: Secondary | ICD-10-CM | POA: Diagnosis not present

## 2020-01-21 DIAGNOSIS — K921 Melena: Secondary | ICD-10-CM | POA: Diagnosis not present

## 2020-01-21 DIAGNOSIS — D631 Anemia in chronic kidney disease: Secondary | ICD-10-CM | POA: Diagnosis present

## 2020-01-21 DIAGNOSIS — K625 Hemorrhage of anus and rectum: Secondary | ICD-10-CM | POA: Diagnosis not present

## 2020-01-21 DIAGNOSIS — I428 Other cardiomyopathies: Secondary | ICD-10-CM | POA: Diagnosis not present

## 2020-01-21 DIAGNOSIS — I130 Hypertensive heart and chronic kidney disease with heart failure and stage 1 through stage 4 chronic kidney disease, or unspecified chronic kidney disease: Secondary | ICD-10-CM | POA: Diagnosis not present

## 2020-01-21 DIAGNOSIS — K5140 Inflammatory polyps of colon without complications: Secondary | ICD-10-CM | POA: Diagnosis not present

## 2020-01-21 DIAGNOSIS — J449 Chronic obstructive pulmonary disease, unspecified: Secondary | ICD-10-CM | POA: Diagnosis not present

## 2020-01-21 DIAGNOSIS — K635 Polyp of colon: Secondary | ICD-10-CM | POA: Diagnosis not present

## 2020-01-21 DIAGNOSIS — K5730 Diverticulosis of large intestine without perforation or abscess without bleeding: Secondary | ICD-10-CM | POA: Diagnosis present

## 2020-01-21 DIAGNOSIS — J440 Chronic obstructive pulmonary disease with acute lower respiratory infection: Secondary | ICD-10-CM | POA: Diagnosis present

## 2020-01-21 DIAGNOSIS — Z888 Allergy status to other drugs, medicaments and biological substances status: Secondary | ICD-10-CM

## 2020-01-21 DIAGNOSIS — J90 Pleural effusion, not elsewhere classified: Secondary | ICD-10-CM | POA: Diagnosis not present

## 2020-01-21 DIAGNOSIS — Z7401 Bed confinement status: Secondary | ICD-10-CM

## 2020-01-21 DIAGNOSIS — Z8673 Personal history of transient ischemic attack (TIA), and cerebral infarction without residual deficits: Secondary | ICD-10-CM | POA: Diagnosis not present

## 2020-01-21 DIAGNOSIS — K319 Disease of stomach and duodenum, unspecified: Secondary | ICD-10-CM | POA: Diagnosis present

## 2020-01-21 DIAGNOSIS — I110 Hypertensive heart disease with heart failure: Secondary | ICD-10-CM | POA: Diagnosis not present

## 2020-01-21 DIAGNOSIS — I4891 Unspecified atrial fibrillation: Secondary | ICD-10-CM | POA: Diagnosis not present

## 2020-01-21 DIAGNOSIS — Z20822 Contact with and (suspected) exposure to covid-19: Secondary | ICD-10-CM | POA: Diagnosis not present

## 2020-01-21 DIAGNOSIS — N183 Chronic kidney disease, stage 3 unspecified: Secondary | ICD-10-CM

## 2020-01-21 DIAGNOSIS — E782 Mixed hyperlipidemia: Secondary | ICD-10-CM | POA: Diagnosis not present

## 2020-01-21 DIAGNOSIS — R0602 Shortness of breath: Secondary | ICD-10-CM | POA: Diagnosis not present

## 2020-01-21 LAB — POC OCCULT BLOOD, ED: Fecal Occult Bld: POSITIVE — AB

## 2020-01-21 LAB — TYPE AND SCREEN
ABO/RH(D): O POS
Antibody Screen: NEGATIVE

## 2020-01-21 LAB — COMPREHENSIVE METABOLIC PANEL
ALT: 18 U/L (ref 0–44)
AST: 23 U/L (ref 15–41)
Albumin: 3.4 g/dL — ABNORMAL LOW (ref 3.5–5.0)
Alkaline Phosphatase: 58 U/L (ref 38–126)
Anion gap: 8 (ref 5–15)
BUN: 29 mg/dL — ABNORMAL HIGH (ref 8–23)
CO2: 27 mmol/L (ref 22–32)
Calcium: 9.3 mg/dL (ref 8.9–10.3)
Chloride: 101 mmol/L (ref 98–111)
Creatinine, Ser: 1.32 mg/dL — ABNORMAL HIGH (ref 0.44–1.00)
GFR, Estimated: 42 mL/min — ABNORMAL LOW (ref >60)
Glucose, Bld: 97 mg/dL (ref 70–99)
Potassium: 5 mmol/L (ref 3.5–5.1)
Sodium: 136 mmol/L (ref 135–145)
Total Bilirubin: 0.7 mg/dL (ref 0.3–1.2)
Total Protein: 6.6 g/dL (ref 6.5–8.1)

## 2020-01-21 LAB — RESPIRATORY PANEL BY RT PCR (FLU A&B, COVID)
Influenza A by PCR: NEGATIVE
Influenza B by PCR: NEGATIVE
SARS Coronavirus 2 by RT PCR: NEGATIVE

## 2020-01-21 LAB — PROTIME-INR
INR: 1.5 — ABNORMAL HIGH (ref 0.8–1.2)
Prothrombin Time: 17.3 seconds — ABNORMAL HIGH (ref 11.4–15.2)

## 2020-01-21 LAB — CBC
HCT: 36.2 % (ref 36.0–46.0)
Hemoglobin: 11.2 g/dL — ABNORMAL LOW (ref 12.0–15.0)
MCH: 29.6 pg (ref 26.0–34.0)
MCHC: 30.9 g/dL (ref 30.0–36.0)
MCV: 95.8 fL (ref 80.0–100.0)
Platelets: 226 10*3/uL (ref 150–400)
RBC: 3.78 MIL/uL — ABNORMAL LOW (ref 3.87–5.11)
RDW: 13.1 % (ref 11.5–15.5)
WBC: 6.5 10*3/uL (ref 4.0–10.5)
nRBC: 0 % (ref 0.0–0.2)

## 2020-01-21 MED ORDER — UMECLIDINIUM BROMIDE 62.5 MCG/INH IN AEPB
1.0000 | INHALATION_SPRAY | Freq: Every day | RESPIRATORY_TRACT | Status: DC
  Administered 2020-01-23 – 2020-01-24 (×2): 1 via RESPIRATORY_TRACT
  Filled 2020-01-21: qty 7

## 2020-01-21 MED ORDER — SODIUM CHLORIDE 0.9 % IV SOLN
8.0000 mg/h | INTRAVENOUS | Status: DC
  Administered 2020-01-21 – 2020-01-23 (×4): 8 mg/h via INTRAVENOUS
  Filled 2020-01-21 (×7): qty 80

## 2020-01-21 MED ORDER — SODIUM CHLORIDE 0.9 % IV SOLN
80.0000 mg | Freq: Once | INTRAVENOUS | Status: AC
  Administered 2020-01-21: 80 mg via INTRAVENOUS
  Filled 2020-01-21: qty 80

## 2020-01-21 MED ORDER — MOMETASONE FURO-FORMOTEROL FUM 100-5 MCG/ACT IN AERO
2.0000 | INHALATION_SPRAY | Freq: Two times a day (BID) | RESPIRATORY_TRACT | Status: DC
  Administered 2020-01-22 – 2020-01-24 (×4): 2 via RESPIRATORY_TRACT
  Filled 2020-01-21: qty 8.8

## 2020-01-21 MED ORDER — CEFDINIR 250 MG/5ML PO SUSR
300.0000 mg | Freq: Two times a day (BID) | ORAL | Status: DC
  Administered 2020-01-21 – 2020-01-22 (×2): 300 mg via ORAL
  Filled 2020-01-21 (×4): qty 6

## 2020-01-21 MED ORDER — ATORVASTATIN CALCIUM 10 MG PO TABS
10.0000 mg | ORAL_TABLET | Freq: Every day | ORAL | Status: DC
  Administered 2020-01-21 – 2020-01-24 (×4): 10 mg via ORAL
  Filled 2020-01-21 (×4): qty 1

## 2020-01-21 MED ORDER — FUROSEMIDE 10 MG/ML IJ SOLN
20.0000 mg | Freq: Once | INTRAMUSCULAR | Status: AC
  Administered 2020-01-21: 20 mg via INTRAVENOUS
  Filled 2020-01-21: qty 2

## 2020-01-21 MED ORDER — BUDESON-GLYCOPYRROL-FORMOTEROL 160-9-4.8 MCG/ACT IN AERO: 2.0000 | INHALATION_SPRAY | Freq: Two times a day (BID) | RESPIRATORY_TRACT | Status: DC

## 2020-01-21 MED ORDER — CARVEDILOL 3.125 MG PO TABS
3.1250 mg | ORAL_TABLET | Freq: Two times a day (BID) | ORAL | Status: DC
  Administered 2020-01-22 – 2020-01-24 (×4): 3.125 mg via ORAL
  Filled 2020-01-21 (×3): qty 1

## 2020-01-21 MED ORDER — SODIUM CHLORIDE 0.9 % IV SOLN: INTRAVENOUS | Status: DC

## 2020-01-21 MED ORDER — MELATONIN 3 MG PO TABS
6.0000 mg | ORAL_TABLET | Freq: Every day | ORAL | Status: DC
  Administered 2020-01-21 – 2020-01-23 (×3): 6 mg via ORAL
  Filled 2020-01-21 (×4): qty 2

## 2020-01-21 NOTE — H&P (Addendum)
History and Physical  Brandy NOREN [name]:[id number] DOB: [birth year] DOA: 01/21/2020  Referring physician: Isla Pence, MD  PCP: [name], MD  Patient coming from: Home  Chief Complaint: Rectal Bleeding  HPI: Brandy [name] is a 79 y.o. female with medical history significant for hypertension and hypertensive heart disease, systolicanddiastolic CHF with EF 52%, hypertension, paroxysmal atrial fibrillation, stroke with left hemiparesis, COPD, and hyperlipidemia who presents to the emergency department accompanied by her husband due to 3-day onset of rectal bleeding.  Most of the history was obtained from the husband, wife also participated in providing history with short sentences and with nodding of head to yes/no questions.  Per husband, patient was noted to present with blood in stool since last 3 days and her home health nurse asked her to go to the ED for further evaluation.  She complained of 2-week onset of increased shortness of breath and was recently diagnosed to have pneumonia and was being treated with cefdinir and azithromycin (patient already completed azithromycin, but still has 3 days to complete cefdinir).  Patient is bedbound and wheelchair-bound at baseline.  ED Course:  In the emergency department, she was tachypneic and tachycardic.  Work-up in the ED showed normocytic anemia, BUN/creatinine 29/1.32, hypoalbuminemia, fecal occult blood test was positive. Chest x-ray showed a small left greater than right pleural effusion with left basilar atelectasis or pneumonia.  Cardiomegaly with vascular congestion noted.  Gastroenterologist (Dr. Abbey Chatters) was consulted by ED physician and recommended Protonix plan to place patient on n.p.o. after midnight.  Hospitalist was asked to admit patient for further evaluation and management.  Review of Systems: Constitutional: Negative for chills and fever.  HENT: Negative for ear pain and sore throat.   Eyes: Negative for pain and  visual disturbance.  Respiratory: Negative for cough, chest tightness and shortness of breath.   Cardiovascular: Negative for chest pain and palpitations.  Gastrointestinal: Positive for blood in stool.  Negative for abdominal pain and vomiting.  Endocrine: Negative for polyphagia and polyuria.  Genitourinary: Negative for decreased urine volume, dysuria, enuresis Musculoskeletal: Negative for arthralgias and back pain.  Skin: Negative for color change and rash.  Allergic/Immunologic: Negative for immunocompromised state.  Neurological: Positive for left-sided weakness.  Negative for tremors, syncope,  light-headedness and headaches.  Hematological: Does not bruise/bleed easily.  All other systems reviewed and are negative    Past Medical History:  Diagnosis Date  . Cardiomyopathy    a. EF of 25% in 07/2006 b. EF normalized by repeat echo in 2011 c. EF 35-40% by echo in 05/2017 with cath showing mild nonobstructive CAD  . Chronic combined systolic and diastolic CHF (congestive heart failure) ([place])   . Colon polyps   . Diverticulosis of colon   . Hypertension   . Hypertensive heart disease   . Osteopenia   . Paroxysmal atrial fibrillation ([place]) 10/10/2018  . Stroke ([place])   . TIA (transient ischemic attack)   . Tobacco abuse    50 pack year   Past Surgical History:  Procedure Laterality Date  . BREAST BIOPSY    . CARPAL TUNNEL RELEASE     right hand  . CERVICAL FUSION    . CESAREAN SECTION     2 times  . COLONOSCOPY  7782   pt uncertain as to whether polypectomy required or performed  . ECTOPIC PREGNANCY SURGERY    . ESOPHAGOGASTRODUODENOSCOPY (EGD) WITH PROPOFOL N/A 10/13/2018   Procedure: ESOPHAGOGASTRODUODENOSCOPY (EGD) WITH PROPOFOL;  Surgeon: Aviva Signs, MD;  Location:  [facility];  Service: General;  Laterality: N/A;  . IR ANGIO VERTEBRAL SEL SUBCLAVIAN INNOMINATE UNI R MOD SED  02/10/2018  . IR CT HEAD LTD  02/10/2018  . IR PERCUTANEOUS ART THROMBECTOMY/INFUSION  INTRACRANIAL INC DIAG ANGIO  02/10/2018  . PEG PLACEMENT N/A 10/13/2018   Procedure: PERCUTANEOUS ENDOSCOPIC GASTROSTOMY (PEG) PLACEMENT;  Surgeon: Aviva Signs, MD;  Location: [facility];  Service: General;  Laterality: N/A;  . PEG PLACEMENT N/A 12/04/2019   Procedure: PERCUTANEOUS ENDOSCOPIC GASTROSTOMY (PEG) REPLACEMENT;  Surgeon: Aviva Signs, MD;  Location[facility];  Service: Gastroenterology;  Laterality: N/A;  . PEG PLACEMENT N/A 12/12/2019   Procedure: PERCUTANEOUS ENDOSCOPIC GASTROSTOMY (PEG) REPLACEMENT;  Surgeon: Aviva Signs, MD;  [facility];  Service: Gastroenterology;  Laterality: N/A;  . RADIOLOGY WITH ANESTHESIA N/A 02/10/2018   Procedure: RADIOLOGY WITH ANESTHESIA;  Surgeon: Luanne Bras, MD;  Location: [facility];  Service: Radiology;  Laterality: N/A;  . RIGHT/LEFT HEART CATH AND CORONARY ANGIOGRAPHY N/A 06/03/2017   Procedure: RIGHT/LEFT HEART CATH AND CORONARY ANGIOGRAPHY;  Surgeon: Jettie Booze, MD;  Location: [place] [facility];  Service: Cardiovascular;  Laterality: N/A;  . TEE WITHOUT CARDIOVERSION N/A 02/13/2018   Procedure: TRANSESOPHAGEAL ECHOCARDIOGRAM (TEE);  Surgeon: Sueanne Margarita, MD;  Location: [facility];  Service: Cardiovascular;  Laterality: N/A;  . TONSILLECTOMY      Social History:  reports that she quit smoking about 11 years ago. Her smoking use included cigarettes. She started smoking about 59 years ago. She has a 50.00 pack-year smoking history. She has never used smokeless tobacco. She reports that she does not drink alcohol and does not use drugs.   Allergies  Allergen Reactions  . Lisinopril Swelling    Family History  Problem Relation Age of Onset  . Hypertension Brother   . Hypertension Maternal Grandmother   . Cancer Paternal Grandmother     Prior to Admission medications   Medication Sig Start Date End Date Taking? Authorizing Provider  acetaminophen (TYLENOL) 325 MG tablet Take 2 tablets (650 mg total) by mouth  every 4 (four) hours as needed for mild pain (or temp > 37.5 C (99.5 F)). Patient taking differently: Take 650 mg by mouth every 6 (six) hours as needed for mild pain or moderate pain.  03/16/18   Angiulli, Lavon Paganini, PA-C  atorvastatin (LIPITOR) 10 MG tablet TAKE 1 TABLET BY MOUTH EVERY DAY AT 6 PM 11/27/19   Strader, [place] M, PA-C  Budeson-Glycopyrrol-Formoterol (BREZTRI AEROSPHERE) 160-9-4.8 MCG/ACT AERO Inhale 2 Inhalers into the lungs in the morning and at bedtime.    [provider]  carvedilol (COREG) 3.125 MG tablet Take 3.125 mg by mouth 2 (two) times daily with a meal.    [provider]  cetirizine (ZYRTEC) 10 MG tablet Take 10 mg by mouth daily.    [provider]  ciprofloxacin (CIPRO) 250 MG tablet  11/21/19   [provider]  citalopram (CELEXA) 10 MG tablet Take 10 mg by mouth daily.    [provider]  donepezil (ARICEPT) 5 MG tablet Take 1 tablet by mouth at bedtime. 11/15/17   [provider]  ELIQUIS 2.5 MG TABS tablet Take 2.5 mg by mouth 2 (two) times daily.  05/09/19   [provider]  ferrous sulfate 324 MG TBEC Take 324 mg by mouth. M/W/F    [provider]  isosorbide mononitrate (IMDUR) 30 MG 24 hr tablet TAKE 1/2 TABLET(15 MG) BY MOUTH DAILY 11/12/19   Ermalinda Barrios  M, PA-C  lactobacillus acidophilus (BACID) TABS tablet Take 1 tablet by mouth daily.    [provider]  Melatonin 5 MG TABS Take by mouth.    [provider]  Multiple Vitamin (MULTIVITAMIN) LIQD Take 15 mLs by mouth daily. 10/15/18   Orson Eva, MD  Nutritional Supplements (FEEDING SUPPLEMENT, OSMOLITE 1.5 CAL,) LIQD Place 120 mLs into feeding tube 5 (five) times daily. 10/14/18   Orson Eva, MD    Physical Exam: BP 129/90   Temp 98.7 F (37.1 C) (Oral)   Resp 20   Ht 5\' 4"  (1.626 m)   Wt 57.2 kg   SpO2 100%   BMI 21.63 kg/m   . General: 79 y.o. year-old female well developed well nourished in no acute distress.   Alert and oriented x3. Marland Kitchen HEENT: Normocephalic, atraumatic, EOMI . Neck: Supple, trachea medial . Cardiovascular: Regular rate and rhythm with no rubs or gallops.  No thyromegaly or JVD noted.  No lower extremity edema. 2/4 pulses in all 4 extremities. Marland Kitchen Respiratory: Clear to auscultation with no wheezes or rales. Good inspiratory effort. . Abdomen: Soft nontender nondistended with normal bowel sounds x4 quadrants. . Muskuloskeletal: No cyanosis, clubbing or edema noted bilaterally . Neuro: Left-sided weakness.  CN II-XII intact, sensation, reflexes intact . Skin: No ulcerative lesions noted or rashes . Psychiatry: Judgement and insight appear normal. Mood is appropriate for condition and setting          Labs on Admission:  Basic Metabolic Panel: Recent Labs  Lab 01/21/20 1903  NA 136  K 5.0  CL 101  CO2 27  GLUCOSE 97  BUN 29*  CREATININE 1.32*  CALCIUM 9.3   Liver Function Tests: Recent Labs  Lab 01/21/20 1903  AST 23  ALT 18  ALKPHOS 58  BILITOT 0.7  PROT 6.6  ALBUMIN 3.4*   No results for input(s): LIPASE, AMYLASE in the last 168 hours. No results for input(s): AMMONIA in the last 168 hours. CBC: Recent Labs  Lab 01/21/20 1903  WBC 6.5  HGB 11.2*  HCT 36.2  MCV 95.8  PLT 226   Cardiac Enzymes: No results for input(s): CKTOTAL, CKMB, CKMBINDEX, TROPONINI in the last 168 hours.  BNP (last 3 results) No results for input(s): BNP in the last 8760 hours.  ProBNP (last 3 results) No results for input(s): PROBNP in the last 8760 hours.  CBG: No results for input(s): GLUCAP in the last 168 hours.  Radiological Exams on Admission: DG Chest 1 View  Result Date: 01/21/2020 CLINICAL DATA:  Increased short of breath rectal bleeding EXAM: CHEST  1 VIEW COMPARISON:  10/10/2018 FINDINGS: Surgical changes in the cervical spine. Small left greater than right pleural effusions. Left basilar airspace disease. Cardiomegaly with vascular congestion. Aortic  atherosclerosis. No pneumothorax. IMPRESSION: Small left greater than right pleural effusions with left basilar atelectasis or pneumonia. Cardiomegaly with vascular congestion. Electronically Signed   By: Donavan Foil M.D.   On: 01/21/2020 19:00    EKG: I independently viewed the EKG done and my findings are as followed: EKG was not done in the ED  Assessment/Plan Present on Admission: . Acute GI bleeding . HTN (hypertension) . Pneumonia . Chronic combined systolic and diastolic congestive heart failure (HCC)  Principal Problem:   Acute GI bleeding Active Problems:   HTN (hypertension)   Pneumonia   Chronic combined systolic and diastolic congestive heart failure (HCC)   CKD (chronic kidney disease) stage 3, GFR 30-59 ml/min (HCC)   Hemiparesis  affecting left side as late effect of stroke [facility])   Atrial fibrillation, chronic (HCC)   Hyperlipidemia  Acute GI bleed Patient presents with 3-day onset of rectal bleed, stool occult test done in the ED was positive H/H= 11.2/36.2, this was 10.8/35.6 on 05/07/2019 Patient was stable and there was no indication for blood transfusion at this time Continue IV Protonix drip N.p.o. after midnight in anticipation for possible endoscopic procedure in the morning Gastroenterologist (Dr. Abbey Chatters) was consulted by ED physician and will see patient in the morning  CAP POA Patient complained of 2-week onset of increased shortness of breath, she was diagnosed for community-acquired pneumonia and was started on antibiotics  Resume home Cefdinir Chest x-ray showed a small left greater than right pleural effusion with left basilar atelectasis or pneumonia IV Lasix 20 mg x 1 will be given Continue incentive spirometry  Essential hypertension Continue Coreg per home regimen  Hyperlipidemia Continue Lipitor 10 mg daily  Atrial fibrillation CHA2DS2-VASc score is 7 which is equal to 11.2 % annual risk of stroke HAS-BLED score = 4 Continue  Coreg Eliquis temporarily held at this time due to GI bleed  CKD stage III Stable, Renally adjust medications, avoid nephrotoxic agents/dehydration/hypotension  Chronic combined systolic and diastolic congestive heart failure Echocardiogram done on 11/06/2018 showed LVEF of 35-40% with moderate concentric LVH and left ventricular diffuse hypokinesis Continue total input/output, daily weights and fluid restriction Administer IV Lasix 20 mg x 1 as indicated above for community-acquired pneumonia due to left pleural effusion  Continue Cardiac diet  Continue Coreg, Lipitor  History of stroke with left sided hemiparesis Patient is bedbound and wheelchair-bound at baseline Continue Lipitor, Eliquis held due to GI bleed Continue fall precaution and neurochecks Continue PT/OT eval and treat  COPD (not in acute exacerbation) Continue Breztri per home regimen   DVT prophylaxis: SCDs  Code Status: Full code  Family Communication: Husband at bedside (all questions answered to satisfaction)  Disposition Plan:  Patient is from:                        home Anticipated DC to:                   SNF or family members home Anticipated DC date:               2-3 days Anticipated DC barriers:         Patient is unstable at this time to be discharged due to acute rectal bleed pending GI evaluation and management.  Consults called: Gastroenterology  Admission status: Inpatient    Bernadette Hoit MD [facility]  01/21/2020, 9:27 PM

## 2020-01-21 NOTE — ED Provider Notes (Signed)
[facility] EMERGENCY DEPARTMENT Provider Note   CSN: [id number] Arrival date & time: 01/21/20  1821     History Chief Complaint  Patient presents with  . Rectal Bleeding    Brandy [name] is a 79 y.o. female.  Pt presents to the ED today with a 3 day hx of rectal bleeding.  Pt is on Eliquis for CVA.  The pt is nonverbal at baseline.  She is alert and nodding appropriately.  She is unable to give any hx.  Her husband said she's had a colonoscopy in the past, but it was a long time ago in [place].  Pt is currently getting treated for pneumonia.        Past Medical History:  Diagnosis Date  . Cardiomyopathy    a. EF of 25% in 07/2006 b. EF normalized by repeat echo in 2011 c. EF 35-40% by echo in 05/2017 with cath showing mild nonobstructive CAD  . Chronic combined systolic and diastolic CHF (congestive heart failure) ([place])   . Colon polyps   . Diverticulosis of colon   . Hypertension   . Hypertensive heart disease   . Osteopenia   . Paroxysmal atrial fibrillation ([place]) 10/10/2018  . Stroke ([place])   . TIA (transient ischemic attack)   . Tobacco abuse    50 pack year    Patient Active Problem List   Diagnosis Date Noted  . PEG tube malfunction ([place])   . Protein-calorie malnutrition, severe 10/12/2018  . Failure to thrive in adult 10/11/2018  . Acute renal failure superimposed on stage 3 chronic kidney disease ([place]) 10/11/2018  . Palliative care by specialist   . Acute prerenal azotemia 10/10/2018  . Hypernatremia 10/10/2018  . AKI (acute kidney injury) ([place]) 10/10/2018  . Paroxysmal atrial fibrillation ([place]) 10/10/2018  . Abnormality of gait 05/04/2018  . Acute exacerbation of congestive heart failure ([place]) 03/28/2018  . Left hemiplegia ([place])   . Chronic kidney disease   . Hypokalemia   . Acute on chronic anemia   . Hypoalbuminemia due to protein-calorie malnutrition ([place])   . Hemiparesis affecting left side as late effect of stroke ([place]) 02/16/2018  .  Left-sided neglect 02/16/2018  . Right middle cerebral artery stroke ([place]) 02/16/2018  . Acute embolic stroke ([place])   . Acute right MCA stroke ([place])   . History of CVA with residual deficit   . Benign essential HTN   . Chronic combined systolic and diastolic congestive heart failure ([place])   . History of fusion of cervical spine   . Stage 3 chronic kidney disease ([place])   . Acute blood loss anemia   . Stroke ([place]) 02/10/2018  . Middle cerebral artery embolism, right 02/10/2018  . Goals of care, counseling/discussion 11/01/2017  . Non-ischemic cardiomyopathy ([place]) 09/14/2017  . Precordial chest pain 09/14/2017  . B12 deficiency 09/13/2017  . Tachycardia 09/13/2017  . Acute ischemic stroke ([place])   . Seasonal allergies 07/13/2017  . TIA (transient ischemic attack) 07/12/2017  . Anemia 07/12/2017  . Pneumonia 06/02/2017  . Pleural effusion 06/02/2017  . Acute on chronic systolic congestive heart failure ([place])   . Elevated troponin   . Other fatigue 05/04/2017  . Dyspnea 05/04/2017  . Pseudophakia of both eyes 08/06/2016  . Chronic combined systolic and diastolic CHF (congestive heart failure) ([place]) 07/09/2016  . Hypertensive heart disease   . Hypertension   . Nuclear sclerosis, bilateral 05/24/2016  . HTN (hypertension) 01/22/2013  . TOBACCO ABUSE 02/18/2008  . CARDIOMYOPATHY 02/18/2008  .  Diverticulosis of colon 02/18/2008  . OSTEOPENIA 02/18/2008  . Disorder of bone and cartilage 02/18/2008    Past Surgical History:  Procedure Laterality Date  . BREAST BIOPSY    . CARPAL TUNNEL RELEASE     right hand  . CERVICAL FUSION    . CESAREAN SECTION     2 times  . COLONOSCOPY  6269   pt uncertain as to whether polypectomy required or performed  . ECTOPIC PREGNANCY SURGERY    . ESOPHAGOGASTRODUODENOSCOPY (EGD) WITH PROPOFOL N/A 10/13/2018   Procedure: ESOPHAGOGASTRODUODENOSCOPY (EGD) WITH PROPOFOL;  Surgeon: Aviva Signs, MD;  Location: [facility] ORS;  Service: General;  Laterality:  N/A;  . IR ANGIO VERTEBRAL SEL SUBCLAVIAN INNOMINATE UNI R MOD SED  02/10/2018  . IR CT HEAD LTD  02/10/2018  . IR PERCUTANEOUS ART THROMBECTOMY/INFUSION INTRACRANIAL INC DIAG ANGIO  02/10/2018  . PEG PLACEMENT N/A 10/13/2018   Procedure: PERCUTANEOUS ENDOSCOPIC GASTROSTOMY (PEG) PLACEMENT;  Surgeon: Aviva Signs, MD;  Location: [facility];  Service: General;  Laterality: N/A;  . PEG PLACEMENT N/A 12/04/2019   Procedure: PERCUTANEOUS ENDOSCOPIC GASTROSTOMY (PEG) REPLACEMENT;  Surgeon: Aviva Signs, MD;  Location: [facility];  Service: Gastroenterology;  Laterality: N/A;  . PEG PLACEMENT N/A 12/12/2019   Procedure: PERCUTANEOUS ENDOSCOPIC GASTROSTOMY (PEG) REPLACEMENT;  Surgeon: Aviva Signs, MD;  Location: [facility];  Service: Gastroenterology;  Laterality: N/A;  . RADIOLOGY WITH ANESTHESIA N/A 02/10/2018   Procedure: RADIOLOGY WITH ANESTHESIA;  Surgeon: Luanne Bras, MD;  Location: [facility];  Service: Radiology;  Laterality: N/A;  . RIGHT/LEFT HEART CATH AND CORONARY ANGIOGRAPHY N/A 06/03/2017   Procedure: RIGHT/LEFT HEART CATH AND CORONARY ANGIOGRAPHY;  Surgeon: Jettie Booze, MD;  Location: [facility];  Service: Cardiovascular;  Laterality: N/A;  . TEE WITHOUT CARDIOVERSION N/A 02/13/2018   Procedure: TRANSESOPHAGEAL ECHOCARDIOGRAM (TEE);  Surgeon: Sueanne Margarita, MD;  Location: [facility] ENDOSCOPY;  Service: Cardiovascular;  Laterality: N/A;  . TONSILLECTOMY       OB History   No obstetric history on file.     Family History  Problem Relation Age of Onset  . Hypertension Brother   . Hypertension Maternal Grandmother   . Cancer Paternal Grandmother     Social History   Tobacco Use  . Smoking status: Former Smoker    Packs/day: 1.00    Years: 50.00    Pack years: 50.00    Types: Cigarettes    Start date: 09/02/1960    Quit date: 03/29/2008    Years since quitting: 11.8  . Smokeless tobacco: Never Used  Vaping Use  . Vaping Use: Never used  Substance Use Topics    . Alcohol use: No    Alcohol/week: 0.0 standard drinks  . Drug use: Never    Home Medications Prior to Admission medications   Medication Sig Start Date End Date Taking? Authorizing Provider  acetaminophen (TYLENOL) 325 MG tablet Take 2 tablets (650 mg total) by mouth every 4 (four) hours as needed for mild pain (or temp > 37.5 C (99.5 F)). Patient taking differently: Take 650 mg by mouth every 6 (six) hours as needed for mild pain or moderate pain.  03/16/18   Angiulli, Lavon Paganini, PA-C  atorvastatin (LIPITOR) 10 MG tablet TAKE 1 TABLET BY MOUTH EVERY DAY AT 6 PM 11/27/19   Strader, [place] M, PA-C  Budeson-Glycopyrrol-Formoterol (BREZTRI AEROSPHERE) 160-9-4.8 MCG/ACT AERO Inhale 2 Inhalers into the lungs in the morning and at bedtime.    [provider]  carvedilol (COREG) 3.125  MG tablet Take 3.125 mg by mouth 2 (two) times daily with a meal.    [provider]  cetirizine (ZYRTEC) 10 MG tablet Take 10 mg by mouth daily.    [provider]  ciprofloxacin (CIPRO) 250 MG tablet  11/21/19   [provider]  citalopram (CELEXA) 10 MG tablet Take 10 mg by mouth daily.    [provider]  donepezil (ARICEPT) 5 MG tablet Take 1 tablet by mouth at bedtime. 11/15/17   [provider]  ELIQUIS 2.5 MG TABS tablet Take 2.5 mg by mouth 2 (two) times daily.  05/09/19   [provider]  ferrous sulfate 324 MG TBEC Take 324 mg by mouth. M/W/F    [provider]  isosorbide mononitrate (IMDUR) 30 MG 24 hr tablet TAKE 1/2 TABLET(15 MG) BY MOUTH DAILY 11/12/19   Imogene Burn, PA-C  lactobacillus acidophilus (BACID) TABS tablet Take 1 tablet by mouth daily.    [provider]  Melatonin 5 MG TABS Take by mouth.    [provider]  Multiple Vitamin (MULTIVITAMIN) LIQD Take 15 mLs by mouth daily. 10/15/18   Orson Eva, MD  Nutritional Supplements (FEEDING SUPPLEMENT, OSMOLITE 1.5 CAL,) LIQD Place 120 mLs into feeding tube 5  (five) times daily. 10/14/18   Orson Eva, MD    Allergies    Lisinopril  Review of Systems   Review of Systems  Gastrointestinal: Positive for blood in stool.  All other systems reviewed and are negative.   Physical Exam Updated Vital Signs BP 129/90   Temp 98.7 F (37.1 C) (Oral)   Resp 20   Ht 5\' 4"  (1.626 m)   Wt 57.2 kg   SpO2 100%   BMI 21.63 kg/m   Physical Exam Vitals and nursing note reviewed.  Constitutional:      Appearance: Normal appearance.  HENT:     Head: Normocephalic and atraumatic.     Right Ear: External ear normal.     Left Ear: External ear normal.     Nose: Nose normal.     Mouth/Throat:     Mouth: Mucous membranes are moist.     Pharynx: Oropharynx is clear.  Eyes:     Extraocular Movements: Extraocular movements intact.     Conjunctiva/sclera: Conjunctivae normal.     Pupils: Pupils are equal, round, and reactive to light.  Cardiovascular:     Rate and Rhythm: Normal rate and regular rhythm.     Pulses: Normal pulses.     Heart sounds: Normal heart sounds.  Pulmonary:     Effort: Pulmonary effort is normal.     Breath sounds: Normal breath sounds.  Abdominal:     General: Abdomen is flat. Bowel sounds are normal.     Palpations: Abdomen is soft.  Genitourinary:    Rectum: Guaiac result positive.     Comments: Black stool Musculoskeletal:     Cervical back: Normal range of motion and neck supple.  Skin:    General: Skin is warm.     Capillary Refill: Capillary refill takes less than 2 seconds.  Neurological:     Mental Status: She is alert. Mental status is at baseline.     Comments: Left arm and leg weakness.  Nonverbal.  Psychiatric:        Mood and Affect: Mood normal.        Behavior: Behavior normal.     ED Results / Procedures / Treatments   Labs (all labs ordered are  listed, but only abnormal results are displayed) Labs Reviewed  COMPREHENSIVE METABOLIC PANEL - Abnormal; Notable for the following components:       Result Value   BUN 29 (*)    Creatinine, Ser 1.32 (*)    Albumin 3.4 (*)    GFR, Estimated 42 (*)    All other components within normal limits  CBC - Abnormal; Notable for the following components:   RBC 3.78 (*)    Hemoglobin 11.2 (*)    All other components within normal limits  PROTIME-INR - Abnormal; Notable for the following components:   Prothrombin Time 17.3 (*)    INR 1.5 (*)    All other components within normal limits  POC OCCULT BLOOD, ED - Abnormal; Notable for the following components:   Fecal Occult Bld POSITIVE (*)    All other components within normal limits  RESPIRATORY PANEL BY RT PCR (FLU A&B, COVID)  TYPE AND SCREEN    EKG None  Radiology DG Chest 1 View  Result Date: 01/21/2020 CLINICAL DATA:  Increased short of breath rectal bleeding EXAM: CHEST  1 VIEW COMPARISON:  10/10/2018 FINDINGS: Surgical changes in the cervical spine. Small left greater than right pleural effusions. Left basilar airspace disease. Cardiomegaly with vascular congestion. Aortic atherosclerosis. No pneumothorax. IMPRESSION: Small left greater than right pleural effusions with left basilar atelectasis or pneumonia. Cardiomegaly with vascular congestion. Electronically Signed   By: Donavan Foil M.D.   On: 01/21/2020 19:00    Procedures Procedures (including critical care time)  Medications Ordered in ED Medications  0.9 %  sodium chloride infusion ( Intravenous New Bag/Given 01/21/20 1928)  pantoprazole (PROTONIX) 80 mg in sodium chloride 0.9 % 100 mL IVPB (has no administration in time range)  pantoprazole (PROTONIX) 80 mg in sodium chloride 0.9 % 100 mL (0.8 mg/mL) infusion (has no administration in time range)    ED Course  I have reviewed the triage vital signs and the nursing notes.  Pertinent labs & imaging results that were available during my care of the patient were reviewed by me and considered in my medical decision making (see chart for details).    MDM  Rules/Calculators/A&P                          CHA2DS2/VAS Stroke Risk Points  Current as of 10 minutes ago     8 >= 2 Points: High Risk  1 - 1.99 Points: Medium Risk  0 Points: Low Risk    Last Change: N/A      Details    This score determines the patient's risk of having a stroke if the  patient has atrial fibrillation.       Points Metrics  1 Has Congestive Heart Failure:  Yes    Current as of 10 minutes ago  1 Has Vascular Disease:  Yes    Current as of 10 minutes ago  1 Has Hypertension:  Yes    Current as of 10 minutes ago  2 Age:  89    Current as of 10 minutes ago  0 Has Diabetes:  No    Current as of 10 minutes ago  2 Had Stroke:  Yes  Had TIA:  Yes  Had thromboembolism:  No    Current as of 10 minutes ago  1 Female:  Yes    Current as of 10 minutes ago         Pt is d/w Dr. Abbey Chatters (  GI) who recommends protonix.  Clears ok now.  NPO after midnight.  Pt d/w Dr. Josephine Cables ([facility]) who will admit.  Final Clinical Impression(s) / ED Diagnoses Final diagnoses:  Acute GI bleeding  On apixaban therapy    Rx / DC Orders ED Discharge Orders    None       Isla Pence, MD 01/21/20 2043

## 2020-01-21 NOTE — ED Triage Notes (Signed)
Pt c/o of rectal bleeding x3 days. Home health nurse asked pt to come and be checked out by edp. Pt is currently being treated for pneumonia. Pt is c/o of increased shortness of breath x2 weeks.

## 2020-01-22 ENCOUNTER — Encounter (HOSPITAL_COMMUNITY): Payer: Self-pay | Admitting: Internal Medicine

## 2020-01-22 DIAGNOSIS — K922 Gastrointestinal hemorrhage, unspecified: Secondary | ICD-10-CM

## 2020-01-22 LAB — PROTIME-INR
INR: 1.4 — ABNORMAL HIGH (ref 0.8–1.2)
Prothrombin Time: 16.6 seconds — ABNORMAL HIGH (ref 11.4–15.2)

## 2020-01-22 LAB — CBC
HCT: 35 % — ABNORMAL LOW (ref 36.0–46.0)
Hemoglobin: 10.7 g/dL — ABNORMAL LOW (ref 12.0–15.0)
MCH: 29.6 pg (ref 26.0–34.0)
MCHC: 30.6 g/dL (ref 30.0–36.0)
MCV: 96.7 fL (ref 80.0–100.0)
Platelets: 173 10*3/uL (ref 150–400)
RBC: 3.62 MIL/uL — ABNORMAL LOW (ref 3.87–5.11)
RDW: 13.2 % (ref 11.5–15.5)
WBC: 6.5 10*3/uL (ref 4.0–10.5)
nRBC: 0 % (ref 0.0–0.2)

## 2020-01-22 LAB — COMPREHENSIVE METABOLIC PANEL
ALT: 16 U/L (ref 0–44)
AST: 22 U/L (ref 15–41)
Albumin: 3.2 g/dL — ABNORMAL LOW (ref 3.5–5.0)
Alkaline Phosphatase: 58 U/L (ref 38–126)
Anion gap: 5 (ref 5–15)
BUN: 27 mg/dL — ABNORMAL HIGH (ref 8–23)
CO2: 26 mmol/L (ref 22–32)
Calcium: 9 mg/dL (ref 8.9–10.3)
Chloride: 104 mmol/L (ref 98–111)
Creatinine, Ser: 1.36 mg/dL — ABNORMAL HIGH (ref 0.44–1.00)
GFR, Estimated: 40 mL/min — ABNORMAL LOW (ref >60)
Glucose, Bld: 79 mg/dL (ref 70–99)
Potassium: 4.7 mmol/L (ref 3.5–5.1)
Sodium: 135 mmol/L (ref 135–145)
Total Bilirubin: 0.8 mg/dL (ref 0.3–1.2)
Total Protein: 6.4 g/dL — ABNORMAL LOW (ref 6.5–8.1)

## 2020-01-22 LAB — APTT: aPTT: 33 seconds (ref 24–36)

## 2020-01-22 LAB — PHOSPHORUS: Phosphorus: 3.4 mg/dL (ref 2.5–4.6)

## 2020-01-22 LAB — MAGNESIUM: Magnesium: 1.9 mg/dL (ref 1.7–2.4)

## 2020-01-22 MED ORDER — METOPROLOL TARTRATE 5 MG/5ML IV SOLN
5.0000 mg | Freq: Four times a day (QID) | INTRAVENOUS | Status: DC | PRN
  Filled 2020-01-22: qty 5

## 2020-01-22 MED ORDER — PNEUMOCOCCAL VAC POLYVALENT 25 MCG/0.5ML IJ INJ: 0.5000 mL | INJECTION | INTRAMUSCULAR | Status: DC | PRN

## 2020-01-22 MED ORDER — CEFDINIR 125 MG/5ML PO SUSR
300.0000 mg | Freq: Two times a day (BID) | ORAL | Status: DC
  Administered 2020-01-22 – 2020-01-24 (×4): 300 mg via ORAL
  Filled 2020-01-22 (×5): qty 15

## 2020-01-22 MED ORDER — PEG 3350-KCL-NA BICARB-NACL 420 G PO SOLR
4000.0000 mL | Freq: Once | ORAL | Status: AC
  Administered 2020-01-22: 4000 mL

## 2020-01-22 NOTE — Progress Notes (Signed)
PROGRESS NOTE    Brandy [name]  PIR:[id number] DOB: [birth year] DOA: 01/21/2020 PCP: [name], MD   Brief Narrative:  Per HPI: Brandy [name] is a 79 y.o. female with medical history significant for hypertension and hypertensive heart disease, systolicanddiastolic CHF with EF 63%, hypertension, paroxysmal atrial fibrillation, stroke with left hemiparesis, COPD, and hyperlipidemia who presents to the emergency department accompanied by her husband due to 3-day onset of rectal bleeding.  Most of the history was obtained from the husband, wife also participated in providing history with short sentences and with nodding of head to yes/no questions.  Per husband, patient was noted to present with blood in stool since last 3 days and her home health nurse asked her to go to the ED for further evaluation.  She complained of 2-week onset of increased shortness of breath and was recently diagnosed to have pneumonia and was being treated with cefdinir and azithromycin (patient already completed azithromycin, but still has 3 days to complete cefdinir).  Patient is bedbound and wheelchair-bound at baseline.  ED Course:  In the emergency department, she was tachypneic and tachycardic.  Work-up in the ED showed normocytic anemia, BUN/creatinine 29/1.32, hypoalbuminemia, fecal occult blood test was positive. Chest x-ray showed a small left greater than right pleural effusion with left basilar atelectasis or pneumonia.  Cardiomegaly with vascular congestion noted.  Gastroenterologist (Dr. Abbey Chatters) was consulted by ED physician and recommended Protonix plan to place patient on n.p.o. after midnight.  Hospitalist was asked to admit patient for further evaluation and management.  10/26: Patient has been admitted with acute GI bleed for further evaluation by GI.  She was kept n.p.o. on admission for possible endoscopic procedure.  She has been started on Protonix infusion and seen by GI with plans for endoscopy  and colonoscopy in a.m.  Assessment & Plan:   Principal Problem:   Acute GI bleeding Active Problems:   HTN (hypertension)   Pneumonia   Chronic combined systolic and diastolic congestive heart failure (HCC)   CKD (chronic kidney disease) stage 3, GFR 30-59 ml/min (HCC)   Hemiparesis affecting left side as late effect of stroke (HCC)   Atrial fibrillation, chronic (HCC)   Hyperlipidemia   Acute rectal bleed -No further bleeding noted on admission thus far -Currently on clear liquid diet with n.p.o. after midnight -Monitor repeat H/H and transfuse for hemoglobin less than 7 -Continue IV Protonix drip as recommended -GI evaluation with recommendations for EGD and colonoscopy on 10/27 -GoLYTELY prep via gastrostomy tube initiated today  Community-acquired pneumonia present on admission with chronic hypoxemia -Currently on 2.5 L nasal cannula oxygen which is near her usual 2 L at home -Continue home cefdinir to finish course of treatment, appears to be day 2/5 -Dose of Lasix given in ED -Continue incentive spirometry  Essential hypertension-controlled -Noted to have some tachycardia for which IV metoprolol has been ordered as needed -Continue Coreg  Dyslipidemia -Continue Lipitor  Atrial fibrillation -Continue Coreg for heart rate control monitor on telemetry -IV metoprolol ordered as needed for heart rate control -Eliquis held due to GI bleed with CHA2DS2-VASc score of 7  CKD stage III -Appears stable with creatinine near 1.3 -Continue monitoring  Chronic combined systolic and diastolic congestive heart failure -2D echocardiogram 10/2018 with LVEF 35-40% -Continue to monitor daily weights with fluid restriction -Avoid aggressive IV fluid  History of CVA with left-sided hemiparesis -Patient is bedbound and wheelchair-bound at baseline -Continue on Lipitor with Eliquis held due to GI bleed -Fall precaution -  Continue PT/OT evaluation and treatment  COPD -No acute  bronchospasms noted -Continue home medications   DVT prophylaxis: SCDs, home Eliquis held Code Status: Full code Family Communication: Discussed with husband at bedside, GI has discussed with brother on phone Disposition Plan:  Status is: Inpatient  Remains inpatient appropriate because:Ongoing diagnostic testing needed not appropriate for outpatient work up, IV treatments appropriate due to intensity of illness or inability to take PO and Inpatient level of care appropriate due to severity of illness   Dispo: The patient is from: Home              Anticipated d/c is to: Home              Anticipated d/c date is: 1 day              Patient currently is not medically stable to d/c.  Patient being prepared for EGD and colonoscopy in a.m.  Consultants:   GI  Procedures:   See below  Antimicrobials:  Anti-infectives (From admission, onward)   Start     Dose/Rate Route Frequency Ordered Stop   01/21/20 2300  cefdinir (OMNICEF) 250 MG/5ML suspension 300 mg        300 mg Oral 2 times daily 01/21/20 2146 01/25/20 0959       Subjective: Patient seen and evaluated today with no new acute complaints or concerns. No acute concerns or events noted overnight.  She denies any abdominal pain or further rectal bleeding.  No nausea or vomiting noted.  Objective: Vitals:   01/22/20 0230 01/22/20 0300 01/22/20 0330 01/22/20 0400  BP: (!) 130/91 120/90 127/90 129/79  Pulse: (!) 102 (!) 123 (!) 124 99  Resp: (!) 26 (!) 24 (!) 23 20  Temp:      TempSrc:      SpO2: 100% 100% 100% 100%  Weight:      Height:        Intake/Output Summary (Last 24 hours) at 01/22/2020 0700 Last data filed at 01/21/2020 2145 Gross per 24 hour  Intake 100 ml  Output --  Net 100 ml   Filed Weights   01/21/20 1834  Weight: 57.2 kg    Examination:  General exam: Appears calm and comfortable  Respiratory system: Clear to auscultation. Respiratory effort normal.  Currently on 2.5 L nasal cannula  oxygen. Cardiovascular system: S1 & S2 heard, RRR. Gastrointestinal system: Abdomen is nondistended, soft and nontender.  Gastrostomy tube present. Central nervous system: Alert and oriented. No focal neurological deficits. Extremities: Symmetric 5 x 5 power. Skin: No rashes, lesions or ulcers Psychiatry: Judgement and insight appear normal. Mood & affect appropriate.     Data Reviewed: I have personally reviewed following labs and imaging studies  CBC: Recent Labs  Lab 01/21/20 1903 01/22/20 0602  WBC 6.5 6.5  HGB 11.2* 10.7*  HCT 36.2 35.0*  MCV 95.8 96.7  PLT 226 676   Basic Metabolic Panel: Recent Labs  Lab 01/21/20 1903 01/22/20 0602  NA 136 135  K 5.0 4.7  CL 101 104  CO2 27 26  GLUCOSE 97 79  BUN 29* 27*  CREATININE 1.32* 1.36*  CALCIUM 9.3 9.0  MG  --  1.9  PHOS  --  3.4   GFR: Estimated Creatinine Clearance: 29.9 mL/min (A) (by C-G formula based on SCr of 1.36 mg/dL (H)). Liver Function Tests: Recent Labs  Lab 01/21/20 1903 01/22/20 0602  AST 23 22  ALT 18 16  ALKPHOS 58 58  BILITOT  0.7 0.8  PROT 6.6 6.4*  ALBUMIN 3.4* 3.2*   No results for input(s): LIPASE, AMYLASE in the last 168 hours. No results for input(s): AMMONIA in the last 168 hours. Coagulation Profile: Recent Labs  Lab 01/21/20 1903  INR 1.5*   Cardiac Enzymes: No results for input(s): CKTOTAL, CKMB, CKMBINDEX, TROPONINI in the last 168 hours. BNP (last 3 results) No results for input(s): PROBNP in the last 8760 hours. HbA1C: No results for input(s): HGBA1C in the last 72 hours. CBG: No results for input(s): GLUCAP in the last 168 hours. Lipid Profile: No results for input(s): CHOL, HDL, LDLCALC, TRIG, CHOLHDL, LDLDIRECT in the last 72 hours. Thyroid Function Tests: No results for input(s): TSH, T4TOTAL, FREET4, T3FREE, THYROIDAB in the last 72 hours. Anemia Panel: No results for input(s): VITAMINB12, FOLATE, FERRITIN, TIBC, IRON, RETICCTPCT in the last 72 hours. Sepsis  Labs: No results for input(s): PROCALCITON, LATICACIDVEN in the last 168 hours.  Recent Results (from the past 240 hour(s))  Respiratory Panel by RT PCR (Flu A&B, Covid) - Nasopharyngeal Swab     Status: None   Collection Time: 01/21/20  8:23 PM   Specimen: Nasopharyngeal Swab  Result Value Ref Range Status   SARS Coronavirus 2 by RT PCR NEGATIVE NEGATIVE Final    Comment: (NOTE) SARS-CoV-2 target nucleic acids are NOT DETECTED.  The SARS-CoV-2 RNA is generally detectable in upper respiratoy specimens during the acute phase of infection. The lowest concentration of SARS-CoV-2 viral copies this assay can detect is 131 copies/mL. A negative result does not preclude SARS-Cov-2 infection and should not be used as the sole basis for treatment or other patient management decisions. A negative result may occur with  improper specimen collection/handling, submission of specimen other than nasopharyngeal swab, presence of viral mutation(s) within the areas targeted by this assay, and inadequate number of viral copies (<131 copies/mL). A negative result must be combined with clinical observations, patient history, and epidemiological information. The expected result is Negative.  Fact Sheet for Patients:  PinkCheek.be  Fact Sheet for Healthcare Providers:  GravelBags.it  This test is no t yet approved or cleared by the [place] FDA and  has been authorized for detection and/or diagnosis of SARS-CoV-2 by FDA under an Emergency Use Authorization (EUA). This EUA will remain  in effect (meaning this test can be used) for the duration of the COVID-19 declaration under Section 564(b)(1) of the Act, 21 U.S.C. section 360bbb-3(b)(1), unless the authorization is terminated or revoked sooner.     Influenza A by PCR NEGATIVE NEGATIVE Final   Influenza B by PCR NEGATIVE NEGATIVE Final    Comment: (NOTE) The Xpert Xpress  SARS-CoV-2/FLU/RSV assay is intended as an aid in  the diagnosis of influenza from Nasopharyngeal swab specimens and  should not be used as a sole basis for treatment. Nasal washings and  aspirates are unacceptable for Xpert Xpress SARS-CoV-2/FLU/RSV  testing.  Fact Sheet for Patients: PinkCheek.be  Fact Sheet for Healthcare Providers: GravelBags.it  This test is not yet approved or cleared by the [place] FDA and  has been authorized for detection and/or diagnosis of SARS-CoV-2 by  FDA under an Emergency Use Authorization (EUA). This EUA will remain  in effect (meaning this test can be used) for the duration of the  Covid-19 declaration under Section 564(b)(1) of the Act, 21  U.S.C. section 360bbb-3(b)(1), unless the authorization is  terminated or revoked. Performed at [facility], [address], [place], [place] [address]  Radiology Studies: DG Chest 1 View  Result Date: 01/21/2020 CLINICAL DATA:  Increased short of breath rectal bleeding EXAM: CHEST  1 VIEW COMPARISON:  10/10/2018 FINDINGS: Surgical changes in the cervical spine. Small left greater than right pleural effusions. Left basilar airspace disease. Cardiomegaly with vascular congestion. Aortic atherosclerosis. No pneumothorax. IMPRESSION: Small left greater than right pleural effusions with left basilar atelectasis or pneumonia. Cardiomegaly with vascular congestion. Electronically Signed   By: Donavan Foil M.D.   On: 01/21/2020 19:00        Scheduled Meds: . atorvastatin  10 mg Oral Daily  . carvedilol  3.125 mg Oral BID WC  . cefdinir  300 mg Oral BID  . melatonin  6 mg Oral QHS  . mometasone-formoterol  2 puff Inhalation BID   And  . umeclidinium bromide  1 puff Inhalation Daily   Continuous Infusions: . sodium chloride 125 mL/hr at 01/22/20 0402  . pantoprozole (PROTONIX) infusion 8 mg/hr (01/21/20 2146)     LOS: 1 day     Time spent: 35 minutes    Coulter Oldaker D Manuella Ghazi, DO [facility]  If 7PM-7AM, please contact night-coverage [email or web address] 01/22/2020, 7:00 AM

## 2020-01-22 NOTE — Consult Note (Signed)
@LOGO @   Referring Provider: [facility]  Primary Care Physician:  [name], MD Primary Gastroenterologist:  Dr. Abbey Chatters (previously Dr. Oneida Alar)  Date of Admission: 01/21/20 Date of Consultation: 01/22/20  Reason for Consultation:  Acute GI bleed  HPI:  Brandy CROCKETT is a 79 y.o. year old female with a history significant for CKD, normocytic anemia, systolic and diastolic CHF with EF 97-41% in August 2020 (previously 20-25% in January 2020), paroxysmal atrial fibrillation on Eliquis, stroke with left hemiparesis, COPD on 2.5 L supplemental O2 chronically, HTN, HLD, PEG tube placed in July 2020 due to failure to thrive who presented to the emergency department due to 3 days of rectal bleeding.  Also reported 2 weeks of increased shortness of breath recently diagnosed with pneumonia and being treated with cefdinir and azithromycin.  ED course: Patient was tachypneic and tachycardic in the emergency room.  Hemoglobin 11.2 with normocytic indices, fecal occult blood positive, BUN 29 (H), creatinine 1.32 (H), INR 1.5.  Per rectal exam in the ED, black stools present. Chest x-ray showed a small left greater than right pleural effusion with left basilar atelectasis or pneumonia.  Cardiomegaly with vascular congestion noted. She was started on IV protonix drip, received IV lasix, and continued on cefdinir.  Eliquis on hold.   Today: History obtained from husband [name]) who is at bedside and patient.   Husband reports patient began to have daily bright red blood per rectum with bowel movements on Thursday/Friday of last week.  Denies constipation or diarrhea.  Typically with soft BMs daily.  No history of rectal bleeding in the past.  Denies melena.  Patient reports she thinks she had a colonoscopy with Dr. Oneida Alar, but cannot remember when this was or the results.  Denies abdominal pain, nausea or vomiting.  Notes she has had loss of appetite for 1-2 weeks.  Had been eating fairly well  prior to this, but continued with tube feeds. No dysphagia or GERD symptoms.  No NSAIDs.  Chronically on Eliquis with last dose the morning of 10/25.  Denies alcohol use, drug use, or tobacco use.  Unaware of chronic anemia.   Chronic shortness of breath on 2.5 L supplemental oxygen via nasal cannula at home.  Feels her shortness of breath is at baseline.  Denies chest pain or heart palpitations. No hematuria.   Reports being diagnosed with left lower lung pneumonia last Tuesday and was started on antibiotics on Wednesday.  No known history of liver disease.  Prior EGD: No prior EGD except with PEG tube placement.  Family history: No family history of colon cancer or other GI cancer.   Previous iron studies in 2019 were consistent with anemia of chronic disease.   Past Medical History:  Diagnosis Date  . Cardiomyopathy    a. EF of 25% in 07/2006 b. EF normalized by repeat echo in 2011 c. EF 35-40% by echo in 05/2017 with cath showing mild nonobstructive CAD  . Chronic combined systolic and diastolic CHF (congestive heart failure) ([place])   . Colon polyps   . Diverticulosis of colon   . Hypertension   . Hypertensive heart disease   . Osteopenia   . Paroxysmal atrial fibrillation ([place]) 10/10/2018  . Stroke ([place])   . TIA (transient ischemic attack)   . Tobacco abuse    50 pack year    Past Surgical History:  Procedure Laterality Date  . BREAST BIOPSY    . CARPAL TUNNEL RELEASE     right hand  .  CERVICAL FUSION    . CESAREAN SECTION     2 times  . COLONOSCOPY  5462   pt uncertain as to whether polypectomy required or performed  . ECTOPIC PREGNANCY SURGERY    . ESOPHAGOGASTRODUODENOSCOPY (EGD) WITH PROPOFOL N/A 10/13/2018   Procedure: ESOPHAGOGASTRODUODENOSCOPY (EGD) WITH PROPOFOL;  Surgeon: Aviva Signs, MD;  Location: [facility];  Service: General;  Laterality: N/A;  . IR ANGIO VERTEBRAL SEL SUBCLAVIAN INNOMINATE UNI R MOD SED  02/10/2018  . IR CT HEAD LTD  02/10/2018  . IR  PERCUTANEOUS ART THROMBECTOMY/INFUSION INTRACRANIAL INC DIAG ANGIO  02/10/2018  . PEG PLACEMENT N/A 10/13/2018   Procedure: PERCUTANEOUS ENDOSCOPIC GASTROSTOMY (PEG) PLACEMENT;  Surgeon: Aviva Signs, MD;  Location: [facility];  Service: General;  Laterality: N/A;  . PEG PLACEMENT N/A 12/04/2019   Procedure: PERCUTANEOUS ENDOSCOPIC GASTROSTOMY (PEG) REPLACEMENT;  Surgeon: Aviva Signs, MD;  Location: [facility];  Service: Gastroenterology;  Laterality: N/A;  . PEG PLACEMENT N/A 12/12/2019   Procedure: PERCUTANEOUS ENDOSCOPIC GASTROSTOMY (PEG) REPLACEMENT;  Surgeon: Aviva Signs, MD;  Location: [facility];  Service: Gastroenterology;  Laterality: N/A;  . RADIOLOGY WITH ANESTHESIA N/A 02/10/2018   Procedure: RADIOLOGY WITH ANESTHESIA;  Surgeon: Luanne Bras, MD;  Location: [facility];  Service: Radiology;  Laterality: N/A;  . RIGHT/LEFT HEART CATH AND CORONARY ANGIOGRAPHY N/A 06/03/2017   Procedure: RIGHT/LEFT HEART CATH AND CORONARY ANGIOGRAPHY;  Surgeon: Jettie Booze, MD;  Location: [facility];  Service: Cardiovascular;  Laterality: N/A;  . TEE WITHOUT CARDIOVERSION N/A 02/13/2018   Procedure: TRANSESOPHAGEAL ECHOCARDIOGRAM (TEE);  Surgeon: Sueanne Margarita, MD;  Location: [facility] ENDOSCOPY;  Service: Cardiovascular;  Laterality: N/A;  . TONSILLECTOMY      Prior to Admission medications   Medication Sig Start Date End Date Taking? Authorizing Provider  acetaminophen (TYLENOL) 325 MG tablet Take 2 tablets (650 mg total) by mouth every 4 (four) hours as needed for mild pain (or temp > 37.5 C (99.5 F)). Patient taking differently: Take 650 mg by mouth every 6 (six) hours as needed for mild pain or moderate pain.  03/16/18  Yes Angiulli, Lavon Paganini, PA-C  atorvastatin (LIPITOR) 10 MG tablet TAKE 1 TABLET BY MOUTH EVERY DAY AT 6 PM 11/27/19  Yes Strader, [place] M, PA-C  Budeson-Glycopyrrol-Formoterol (BREZTRI AEROSPHERE) 160-9-4.8 MCG/ACT AERO Inhale 2 Inhalers into the lungs in the morning  and at bedtime.   Yes [provider]  carvedilol (COREG) 3.125 MG tablet Take 3.125 mg by mouth 2 (two) times daily with a meal.   Yes [provider]  cefdinir (OMNICEF) 250 MG/5ML suspension Take 100 mLs by mouth every 12 (twelve) hours. 01/16/20  Yes [provider]  cetirizine (ZYRTEC) 10 MG tablet Take 10 mg by mouth daily.   Yes [provider]  citalopram (CELEXA) 10 MG tablet Take 10 mg by mouth daily.   Yes [provider]  donepezil (ARICEPT) 5 MG tablet Take 1 tablet by mouth at bedtime. 11/15/17  Yes [provider]  ELIQUIS 2.5 MG TABS tablet Take 2.5 mg by mouth 2 (two) times daily.  05/09/19  Yes [provider]  ferrous sulfate 324 MG TBEC Take 324 mg by mouth. M/W/F   Yes [provider]  isosorbide mononitrate (IMDUR) 30 MG 24 hr tablet TAKE 1/2 TABLET(15 MG) BY MOUTH DAILY 11/12/19  Yes Imogene Burn, PA-C  Melatonin 5 MG TABS Take by mouth.   Yes [provider]  Multiple Vitamin (MULTIVITAMIN) LIQD Take 15 mLs  by mouth daily. 10/15/18  Yes Tat, Shanon Brow, MD  Nutritional Supplements (FEEDING SUPPLEMENT, OSMOLITE 1.5 CAL,) LIQD Place 120 mLs into feeding tube 5 (five) times daily. 10/14/18  Yes Tat, Shanon Brow, MD  lactobacillus acidophilus (BACID) TABS tablet Take 1 tablet by mouth daily. Patient not taking: Reported on 01/21/2020    [provider]    Current Facility-Administered Medications  Medication Dose Route Frequency Provider Last Rate Last Admin  . atorvastatin (LIPITOR) tablet 10 mg  10 mg Oral Daily Adefeso, Oladapo, DO   10 mg at 01/22/20 0957  . carvedilol (COREG) tablet 3.125 mg  3.125 mg Oral BID WC Adefeso, Oladapo, DO   3.125 mg at 01/22/20 0746  . cefdinir (OMNICEF) 250 MG/5ML suspension 300 mg  300 mg Oral BID Adefeso, Oladapo, DO   300 mg at 01/21/20 2351  . melatonin tablet 6 mg  6 mg Oral QHS Adefeso, Oladapo, DO   6 mg at 01/21/20 2358  . mometasone-formoterol (DULERA)  100-5 MCG/ACT inhaler 2 puff  2 puff Inhalation BID Adefeso, Oladapo, DO       And  . umeclidinium bromide (INCRUSE ELLIPTA) 62.5 MCG/INH 1 puff  1 puff Inhalation Daily Adefeso, Oladapo, DO      . pantoprazole (PROTONIX) 80 mg in sodium chloride 0.9 % 100 mL (0.8 mg/mL) infusion  8 mg/hr Intravenous Continuous Adefeso, Oladapo, DO 10 mL/hr at 01/22/20 0836 8 mg/hr at 01/22/20 2542   Current Outpatient Medications  Medication Sig Dispense Refill  . acetaminophen (TYLENOL) 325 MG tablet Take 2 tablets (650 mg total) by mouth every 4 (four) hours as needed for mild pain (or temp > 37.5 C (99.5 F)). (Patient taking differently: Take 650 mg by mouth every 6 (six) hours as needed for mild pain or moderate pain. )    . atorvastatin (LIPITOR) 10 MG tablet TAKE 1 TABLET BY MOUTH EVERY DAY AT 6 PM 90 tablet 2  . Budeson-Glycopyrrol-Formoterol (BREZTRI AEROSPHERE) 160-9-4.8 MCG/ACT AERO Inhale 2 Inhalers into the lungs in the morning and at bedtime.    . carvedilol (COREG) 3.125 MG tablet Take 3.125 mg by mouth 2 (two) times daily with a meal.    . cefdinir (OMNICEF) 250 MG/5ML suspension Take 100 mLs by mouth every 12 (twelve) hours.    . cetirizine (ZYRTEC) 10 MG tablet Take 10 mg by mouth daily.    . citalopram (CELEXA) 10 MG tablet Take 10 mg by mouth daily.    Mar[place] Kitchen donepezil (ARICEPT) 5 MG tablet Take 1 tablet by mouth at bedtime.  1  . ELIQUIS 2.5 MG TABS tablet Take 2.5 mg by mouth 2 (two) times daily.     . ferrous sulfate 324 MG TBEC Take 324 mg by mouth. M/W/F    . isosorbide mononitrate (IMDUR) 30 MG 24 hr tablet TAKE 1/2 TABLET(15 MG) BY MOUTH DAILY 45 tablet 2  . Melatonin 5 MG TABS Take by mouth.    . Multiple Vitamin (MULTIVITAMIN) LIQD Take 15 mLs by mouth daily. 870 mL 99  . Nutritional Supplements (FEEDING SUPPLEMENT, OSMOLITE 1.5 CAL,) LIQD Place 120 mLs into feeding tube 5 (five) times daily. 237 mL 99  . lactobacillus acidophilus (BACID) TABS tablet Take 1 tablet by mouth daily. (Patient  not taking: Reported on 01/21/2020)     Facility-Administered Medications Ordered in Other Encounters  Medication Dose Route Frequency Provider Last Rate Last Admin  . iohexol (OMNIPAQUE) 300 MG/ML solution 50 mL  50 mL Per Tube Once PRN Long, Wonda Olds, MD  Allergies as of 01/21/2020 - Review Complete 01/21/2020  Allergen Reaction Noted  . Lisinopril Swelling 06/22/2016    Family History  Problem Relation Age of Onset  . Hypertension Brother   . Hypertension Maternal Grandmother   . Cancer Paternal Grandmother   . Colon cancer Neg Hx     Social History   Socioeconomic History  . Marital status: Married    Spouse name: Not on file  . Number of children: Not on file  . Years of education: Not on file  . Highest education level: Not on file  Occupational History  . Occupation: Former Product manager: RETIRED  Tobacco Use  . Smoking status: Former Smoker    Packs/day: 1.00    Years: 50.00    Pack years: 50.00    Types: Cigarettes    Start date: 09/02/1960    Quit date: 03/29/2008    Years since quitting: 11.8  . Smokeless tobacco: Never Used  Vaping Use  . Vaping Use: Never used  Substance and Sexual Activity  . Alcohol use: No    Alcohol/week: 0.0 standard drinks  . Drug use: Never  . Sexual activity: Not Currently  Other Topics Concern  . Not on file  Social History Narrative   Married with 2 adult children, resides with spouse    Social Determinants of Health   Financial Resource Strain:   . Difficulty of Paying Living Expenses: Not on file  Food Insecurity:   . Worried About Charity fundraiser in the Last Year: Not on file  . Ran Out of Food in the Last Year: Not on file  Transportation Needs:   . Lack of Transportation (Medical): Not on file  . Lack of Transportation (Non-Medical): Not on file  Physical Activity:   . Days of Exercise per Week: Not on file  . Minutes of Exercise per Session: Not on file  Stress:   . Feeling of Stress : Not on  file  Social Connections:   . Frequency of Communication with Friends and Family: Not on file  . Frequency of Social Gatherings with Friends and Family: Not on file  . Attends Religious Services: Not on file  . Active Member of Clubs or Organizations: Not on file  . Attends Archivist Meetings: Not on file  . Marital Status: Not on file  Intimate Partner Violence:   . Fear of Current or Ex-Partner: Not on file  . Emotionally Abused: Not on file  . Physically Abused: Not on file  . Sexually Abused: Not on file    Review of Systems: Gen: Denies fever, chills, lightheadedness, dizziness, presyncope, syncope. CV: See HPI Resp: See HPI GI: See HPI GU : Denies hematuria. Heme: See HPI  Physical Exam: Vital signs in last 24 hours: Temp:  [98.7 F (37.1 C)] 98.7 F (37.1 C) (10/25 1833) Pulse Rate:  [89-127] 98 (10/26 0930) Resp:  [14-31] 27 (10/26 0930) BP: (110-137)/(63-106) 116/72 (10/26 0930) SpO2:  [96 %-100 %] 96 % (10/26 0930) FiO2 (%):  [28 %] 28 % (10/25 2117) Weight:  [57.2 kg] 57.2 kg (10/25 1834)   General:   Alert, pleasant and cooperative in NAD, left-sided paralysis. Head:  Normocephalic and atraumatic. Eyes:  Sclera clear, no icterus.  Ears:  Normal auditory acuity. Lungs: Crackles in the bases of the lungs bilaterally.  Some decreased breath sounds in the left lung. Heart:  Regular rate and rhythm; no murmurs. Abdomen:  Soft, nontender and nondistended. No masses,  hepatosplenomegaly or hernias noted. Normal bowel sounds, without guarding, and without rebound.   Rectal: External hemorrhoid/skin tag, no appreciable internal hemorrhoids or obvious masses, stool was dark/maroon colored. Exam somewhat difficult due to limited mobility Msk:  Symmetrical without gross deformities.  Left-sided paralysis. Extremities:  Without edema. Neurologic:  Alert and  oriented x3 Skin:  Intact without significant lesions or rashes. Psych: Normal mood and  affect.  Intake/Output from previous day: 10/25 0701 - 10/26 0700 In: 100 [IV Piggyback:100] Out: -  Intake/Output this shift: Total I/O In: 1516.7 [I.V.:1516.7] Out: -   Lab Results: Recent Labs    01/21/20 1903 01/22/20 0602  WBC 6.5 6.5  HGB 11.2* 10.7*  HCT 36.2 35.0*  PLT 226 173   BMET Recent Labs    01/21/20 1903 01/22/20 0602  NA 136 135  K 5.0 4.7  CL 101 104  CO2 27 26  GLUCOSE 97 79  BUN 29* 27*  CREATININE 1.32* 1.36*  CALCIUM 9.3 9.0   LFT Recent Labs    01/21/20 1903 01/22/20 0602  PROT 6.6 6.4*  ALBUMIN 3.4* 3.2*  AST 23 22  ALT 18 16  ALKPHOS 58 58  BILITOT 0.7 0.8   PT/INR Recent Labs    01/21/20 1903 01/22/20 0602  LABPROT 17.3* 16.6*  INR 1.5* 1.4*    Studies/Results: DG Chest 1 View  Result Date: 01/21/2020 CLINICAL DATA:  Increased short of breath rectal bleeding EXAM: CHEST  1 VIEW COMPARISON:  10/10/2018 FINDINGS: Surgical changes in the cervical spine. Small left greater than right pleural effusions. Left basilar airspace disease. Cardiomegaly with vascular congestion. Aortic atherosclerosis. No pneumothorax. IMPRESSION: Small left greater than right pleural effusions with left basilar atelectasis or pneumonia. Cardiomegaly with vascular congestion. Electronically Signed   By: Donavan Foil M.D.   On: 01/21/2020 19:00    Impression: 79 year old female with complex medical history including CKD,normocytic anemia, systolic and diastolic CHF with EF 74-94% in August 2020 (previously 20-25% in January 2020), paroxysmal atrial fibrillation on Eliquis, stroke with left hemiparesis, COPD on 2.5L nasal canula chronically, HTN, HLD, PEG tube placed in July 2020 due to failure to thrive who presented to the emergency department due to rectal bleeding. Also reported increased shortness of breath s/p recent diagnosis of pneumonia last week, currently on antibiotics.  Chest x-ray 10/25 with small left greater than right pleural effusion with  left bibasilar atelectasis or pneumonia, cardiomegaly with vascular congestion. She has received Lasix x1, continued on antibiotics, and started on PPI infusion.  Rectal Bleeding: New onset painless rectal bleeding x4-5 days. Per patient's husband, patient has had bright red blood per rectum with bowel movements occurring 1-3 times daily.  Denies melena, but per records in the ED, patient had black stool on rectal exam. Stool was heme positive. On today's rectal exam, stool is very dark with a maroon tent with a couple flecks of bright red blood.  Hemoglobin on admission 11.2 with normocytic indices which seems to be within her baseline fluctuations. Hemoglobin this morning 10.7.  Nursing staff unable to tell me if she had any rectal bleeding overnight. No significant upper GI symptoms other than loss of appetite over the last 1-2 weeks.  Denies NSAIDs. Chronically on Eliquis with last dose morning of 01/21/20. Resorts colonoscopy with Dr. Oneida Alar in the past but I can not find any record of this and she doesn't remember the results. No family history of colon cancer.  With maroon colored stools/black stool reported yesterday, concern for upper  GI bleed. Differentials include PUD, H. Pylori gastritis, duodenitis, AVMs, dieulafoy lesion, but with hemoglobin fairly stable today, doubt brisk upper GI bleed. Can't rule out right sided colon lesion contributing to dark/maroon colored stool. With report of brbpr per husband, may have colonic AVMs, colon polyps, malignancy, stuttering diverticular bleed.   She would likely benefit from EGD +/- colonoscopy. Unable to pursue procedures today due to last dose of Eliquis morning of 10/25. Earliest would likely be 10/27. However, with her history, will need to review with anesthesia if patient is a candidate for endoscopic evaluation here at [facility].   Plan: Discuss with anesthesia candidacy of endoscopic evaluation locally. May need repeat ECHO.  Suspect patient  would likely benefit from EGD +/-colonoscopy.  At the earliest, this would likely be 10/27. Continue PPI infusion. Continue to hold Eliquis. Monitor H/H. Monitor for ongoing overt GI bleeding. Clear liquid diet. We will continue to follow closely with you.   LOS: 1 day    01/22/2020, 10:44 AM   Aliene Altes, PA-C [facility]

## 2020-01-22 NOTE — TOC Initial Note (Signed)
Transition of Care [facility]) - Initial/Assessment Note    Patient Details  Name: Brandy [name] MRN: [id number] Date of Birth: June 23, 1940  Transition of Care [facility]) CM/SW Contact:    Boneta Lucks, RN Phone Number: 01/22/2020, 1:46 PM  Clinical Narrative:    Patient admitted with GI bleed. Lives at home with her spouse. PT is recommending HHPT. Patient used [facility] in the past. They are not accepting [facility] [employer]. Choice given to spouse. Referral given to [place] with [facility] for [place]. TOC to follow, patient still in ED.               Expected Discharge Plan: [place] Barriers to Discharge: Continued Medical Work up   Patient Goals and CMS Choice Patient states their goals for this hospitalization and ongoing recovery are:: to return home. CMS Medicare.gov Compare Post Acute Care list provided to:: Patient Represenative (must comment) Choice offered to / list presented to : Spouse  Expected Discharge Plan and Services Expected Discharge Plan: [place]: PT [facility] Agency: [facility] (now [facility]) Date [place]: 01/22/20 Time [place]: 71 Representative spoke with at [place]: Tim  Prior Living Arrangements/Services     Patient language and need for interpreter reviewed:: Yes Do you feel safe going back to the place where you live?: Yes      Need for Family Participation in Patient Care: Yes (Comment) Care giver support system in place?: Yes (comment)   Criminal Activity/Legal Involvement Pertinent to Current Situation/Hospitalization: No - Comment as needed  Activities of Daily Living      Permission Sought/Granted      Share Information with NAME: Lara Palinkas     Permission granted to share info w Relationship: Husband     Emotional Assessment       Orientation: : Oriented to Self, Oriented to Place, Oriented to  Time, Oriented to Situation Alcohol / Substance Use:  Not Applicable Psych Involvement: No (comment)  Admission diagnosis:  Acute GI bleeding [K92.2] Patient Active Problem List   Diagnosis Date Noted  . Acute GI bleeding 01/21/2020  . Atrial fibrillation, chronic ([place]) 01/21/2020  . Hyperlipidemia 01/21/2020  . PEG tube malfunction ([place])   . Protein-calorie malnutrition, severe 10/12/2018  . Failure to thrive in adult 10/11/2018  . Acute renal failure superimposed on stage 3 chronic kidney disease ([place]) 10/11/2018  . Palliative care by specialist   . Acute prerenal azotemia 10/10/2018  . Hypernatremia 10/10/2018  . AKI (acute kidney injury) ([place]) 10/10/2018  . Paroxysmal atrial fibrillation ([place]) 10/10/2018  . Abnormality of gait 05/04/2018  . Acute exacerbation of congestive heart failure ([place]) 03/28/2018  . Left hemiplegia ([place])   . Chronic kidney disease   . Hypokalemia   . Acute on chronic anemia   . Hypoalbuminemia due to protein-calorie malnutrition ([place])   . Hemiparesis affecting left side as late effect of stroke ([place]) 02/16/2018  . Left-sided neglect 02/16/2018  . Right middle cerebral artery stroke ([place]) 02/16/2018  . Acute embolic stroke ([place])   . Acute right MCA stroke ([place])   . History of CVA with residual deficit   . Benign essential HTN   . Chronic combined systolic and diastolic congestive heart failure ([place])   . History of fusion of cervical spine   . CKD (chronic kidney disease) stage 3, GFR 30-59 ml/min (HCC)   . Acute blood loss anemia   .  Stroke ([place]) 02/10/2018  . Middle cerebral artery embolism, right 02/10/2018  . Goals of care, counseling/discussion 11/01/2017  . Non-ischemic cardiomyopathy ([place]) 09/14/2017  . Precordial chest pain 09/14/2017  . B12 deficiency 09/13/2017  . Tachycardia 09/13/2017  . Acute ischemic stroke ([place])   . Seasonal allergies 07/13/2017  . TIA (transient ischemic attack) 07/12/2017  . Anemia 07/12/2017  . Pneumonia 06/02/2017  . Pleural effusion 06/02/2017  . Acute on  chronic systolic congestive heart failure ([place])   . Elevated troponin   . Other fatigue 05/04/2017  . Dyspnea 05/04/2017  . Pseudophakia of both eyes 08/06/2016  . Chronic combined systolic and diastolic CHF (congestive heart failure) ([place]) 07/09/2016  . Hypertensive heart disease   . Hypertension   . Nuclear sclerosis, bilateral 05/24/2016  . HTN (hypertension) 01/22/2013  . TOBACCO ABUSE 02/18/2008  . CARDIOMYOPATHY 02/18/2008  . Diverticulosis of colon 02/18/2008  . OSTEOPENIA 02/18/2008  . Disorder of bone and cartilage 02/18/2008   PCP:  [name], MD Pharmacy:   [facility], [address] AT [place]. [place] [address] Phone: [phone] Fax: [phone]   Readmission Risk Interventions Readmission Risk Prevention Plan 10/15/2018  Transportation Screening Complete  PCP or Specialist Appt within 3-5 Days Complete  HRI or Home Care Consult Complete  Social Work Consult for [place] Planning/Counseling Complete  Palliative Care Screening Not Applicable  Medication Review Press photographer) Complete  Some recent data might be hidden

## 2020-01-22 NOTE — ED Notes (Signed)
Pt chux and diaper changed, purewich changed. Pt resting at this time.

## 2020-01-22 NOTE — ED Notes (Signed)
ED TO INPATIENT HANDOFF REPORT  ED Nurse Name and Phone #: [phone]  S Name/Age/Gender Brandy [name] 79 y.o. female Room/Bed: APA17/APA17  Code Status   Code Status: Full Code  Home/SNF/Other Home Patient oriented to: self, place, time and situation Is this baseline? Yes   Triage Complete: Triage complete  Chief Complaint Acute GI bleeding [K92.2]  Triage Note Pt c/o of rectal bleeding x3 days. Home health nurse asked pt to come and be checked out by edp. Pt is currently being treated for pneumonia. Pt is c/o of increased shortness of breath x2 weeks.     Allergies Allergies  Allergen Reactions   Lisinopril Swelling    Level of Care/Admitting Diagnosis ED Disposition    ED Disposition Condition [facility] Area: [facility] [[id number]]  Level of Care: Telemetry [5]  Covid Evaluation: Asymptomatic Screening Protocol (No Symptoms)  Diagnosis: Acute GI bleeding [323557]  Admitting Physician: Bernadette Hoit [[id number]]  Attending Physician: Bernadette Hoit [[id number]]  Estimated length of stay: past midnight tomorrow  Certification:: I certify this patient will need inpatient services for at least 2 midnights       B Medical/Surgery History Past Medical History:  Diagnosis Date   Cardiomyopathy    a. EF of 25% in 07/2006 b. EF normalized by repeat echo in 2011 c. EF 35-40% by echo in 05/2017 with cath showing mild nonobstructive CAD   Chronic combined systolic and diastolic CHF (congestive heart failure) ([place])    Colon polyps    Diverticulosis of colon    Hypertension    Hypertensive heart disease    Osteopenia    Paroxysmal atrial fibrillation ([place]) 10/10/2018   Stroke ([place])    TIA (transient ischemic attack)    Tobacco abuse    50 pack year   Past Surgical History:  Procedure Laterality Date   BREAST BIOPSY     CARPAL TUNNEL RELEASE     right hand   CERVICAL FUSION     CESAREAN SECTION     2 times    COLONOSCOPY  6283   pt uncertain as to whether polypectomy required or performed   ECTOPIC PREGNANCY SURGERY     ESOPHAGOGASTRODUODENOSCOPY (EGD) WITH PROPOFOL N/A 10/13/2018   Procedure: ESOPHAGOGASTRODUODENOSCOPY (EGD) WITH PROPOFOL;  Surgeon: Aviva Signs, MD;  Location: [facility];  Service: General;  Laterality: N/A;   IR ANGIO VERTEBRAL SEL SUBCLAVIAN INNOMINATE UNI R MOD SED  02/10/2018   IR CT HEAD LTD  02/10/2018   IR PERCUTANEOUS ART THROMBECTOMY/INFUSION INTRACRANIAL INC DIAG ANGIO  02/10/2018   PEG PLACEMENT N/A 10/13/2018   Procedure: PERCUTANEOUS ENDOSCOPIC GASTROSTOMY (PEG) PLACEMENT;  Surgeon: Aviva Signs, MD;  Location: [facility];  Service: General;  Laterality: N/A;   PEG PLACEMENT N/A 12/04/2019   Procedure: PERCUTANEOUS ENDOSCOPIC GASTROSTOMY (PEG) REPLACEMENT;  Surgeon: Aviva Signs, MD;  Location: [facility];  Service: Gastroenterology;  Laterality: N/A;   PEG PLACEMENT N/A 12/12/2019   Procedure: PERCUTANEOUS ENDOSCOPIC GASTROSTOMY (PEG) REPLACEMENT;  Surgeon: Aviva Signs, MD;  Location: [facility];  Service: Gastroenterology;  Laterality: N/A;   RADIOLOGY WITH ANESTHESIA N/A 02/10/2018   Procedure: RADIOLOGY WITH ANESTHESIA;  Surgeon: Luanne Bras, MD;  Location: [place];  Service: Radiology;  Laterality: N/A;   RIGHT/LEFT HEART CATH AND CORONARY ANGIOGRAPHY N/A 06/03/2017   Procedure: RIGHT/LEFT HEART CATH AND CORONARY ANGIOGRAPHY;  Surgeon: Jettie Booze, MD;  Location: [place] [facility];  Service: Cardiovascular;  Laterality: N/A;   TEE WITHOUT CARDIOVERSION N/A  02/13/2018   Procedure: TRANSESOPHAGEAL ECHOCARDIOGRAM (TEE);  Surgeon: Sueanne Margarita, MD;  Location: [facility] ENDOSCOPY;  Service: Cardiovascular;  Laterality: N/A;   TONSILLECTOMY       A IV Location/Drains/Wounds Patient Lines/Drains/Airways Status    Active Line/Drains/Airways    Name Placement date Placement time Site Days   Peripheral IV 01/21/20 Anterior;Right Forearm 01/21/20   1928  Forearm  1   Gastrostomy/Enterostomy Percutaneous endoscopic gastrostomy (PEG) 20 Fr. LUQ 10/13/18  1157  LUQ  466   Gastrostomy/Enterostomy Percutaneous endoscopic gastrostomy (PEG) 16 Fr. LUQ --  --  LUQ     External Urinary Catheter 10/11/18  0058  --  468   Incision (Closed) 10/13/18 Abdomen 10/13/18  1159   466   Wound / Incision (Open or Dehisced) 03/31/18 Other (Comment) Sacrum Right;Left 3 wounds on sacrum noted. Site cleansed. New foam placed. Present prior to admission per patient's family at bedside today 03/31/18  0755  Sacrum  662          Intake/Output Last 24 hours  Intake/Output Summary (Last 24 hours) at 01/22/2020 1052 Last data filed at 01/22/2020 0736 Gross per 24 hour  Intake 1616.67 ml  Output --  Net 1616.67 ml    Labs/Imaging Results for orders placed or performed during the hospital encounter of 01/21/20 (from the past 48 hour(s))  POC occult blood, ED     Status: Abnormal   Collection Time: 01/21/20  6:53 PM  Result Value Ref Range   Fecal Occult Bld POSITIVE (A) NEGATIVE  Comprehensive metabolic panel     Status: Abnormal   Collection Time: 01/21/20  7:03 PM  Result Value Ref Range   Sodium 136 135 - 145 mmol/L   Potassium 5.0 3.5 - 5.1 mmol/L   Chloride 101 98 - 111 mmol/L   CO2 27 22 - 32 mmol/L   Glucose, Bld 97 70 - 99 mg/dL    Comment: Glucose reference range applies only to samples taken after fasting for at least 8 hours.   BUN 29 (H) 8 - 23 mg/dL   Creatinine, Ser 1.32 (H) 0.44 - 1.00 mg/dL   Calcium 9.3 8.9 - 10.3 mg/dL   Total Protein 6.6 6.5 - 8.1 g/dL   Albumin 3.4 (L) 3.5 - 5.0 g/dL   AST 23 15 - 41 U/L   ALT 18 0 - 44 U/L   Alkaline Phosphatase 58 38 - 126 U/L   Total Bilirubin 0.7 0.3 - 1.2 mg/dL   GFR, Estimated 42 (L) >60 mL/min    Comment: (NOTE) Calculated using the CKD-EPI Creatinine Equation (2021)    Anion gap 8 5 - 15    Comment: Performed at [facility], [address], [place], [place] [address]  CBC      Status: Abnormal   Collection Time: 01/21/20  7:03 PM  Result Value Ref Range   WBC 6.5 4.0 - 10.5 K/uL   RBC 3.78 (L) 3.87 - 5.11 MIL/uL   Hemoglobin 11.2 (L) 12.0 - 15.0 g/dL   HCT 36.2 36 - 46 %   MCV 95.8 80.0 - 100.0 fL   MCH 29.6 26.0 - 34.0 pg   MCHC 30.9 30.0 - 36.0 g/dL   RDW 13.1 11.5 - 15.5 %   Platelets 226 150 - 400 K/uL   nRBC 0.0 0.0 - 0.2 %    Comment: Performed at [facility], [address] [place], [place] [address]  Type and screen [facility]     Status: None  Collection Time: 01/21/20  7:03 PM  Result Value Ref Range   ABO/RH(D) O POS    Antibody Screen NEG    Sample Expiration      01/24/2020,2359 Performed at [facility], [address] [place], [place] [address]   Protime-INR     Status: Abnormal   Collection Time: 01/21/20  7:03 PM  Result Value Ref Range   Prothrombin Time 17.3 (H) 11.4 - 15.2 seconds   INR 1.5 (H) 0.8 - 1.2    Comment: (NOTE) INR goal varies based on device and disease states. Performed at [facility], [address], [place], [place] [address]   Respiratory Panel by RT PCR (Flu A&B, Covid) - Nasopharyngeal Swab     Status: None   Collection Time: 01/21/20  8:23 PM   Specimen: Nasopharyngeal Swab  Result Value Ref Range   SARS Coronavirus 2 by RT PCR NEGATIVE NEGATIVE    Comment: (NOTE) SARS-CoV-2 target nucleic acids are NOT DETECTED.  The SARS-CoV-2 RNA is generally detectable in upper respiratoy specimens during the acute phase of infection. The lowest concentration of SARS-CoV-2 viral copies this assay can detect is 131 copies/mL. A negative result does not preclude SARS-Cov-2 infection and should not be used as the sole basis for treatment or other patient management decisions. A negative result may occur with  improper specimen collection/handling, submission of specimen other than nasopharyngeal swab, presence of viral mutation(s) within the areas targeted by this assay, and inadequate number of viral  copies (<131 copies/mL). A negative result must be combined with clinical observations, patient history, and epidemiological information. The expected result is Negative.  Fact Sheet for Patients:  PinkCheek.be  Fact Sheet for Healthcare Providers:  GravelBags.it  This test is no t yet approved or cleared by the [place] FDA and  has been authorized for detection and/or diagnosis of SARS-CoV-2 by FDA under an Emergency Use Authorization (EUA). This EUA will remain  in effect (meaning this test can be used) for the duration of the COVID-19 declaration under Section 564(b)(1) of the Act, 21 U.S.C. section 360bbb-3(b)(1), unless the authorization is terminated or revoked sooner.     Influenza A by PCR NEGATIVE NEGATIVE   Influenza B by PCR NEGATIVE NEGATIVE    Comment: (NOTE) The Xpert Xpress SARS-CoV-2/FLU/RSV assay is intended as an aid in  the diagnosis of influenza from Nasopharyngeal swab specimens and  should not be used as a sole basis for treatment. Nasal washings and  aspirates are unacceptable for Xpert Xpress SARS-CoV-2/FLU/RSV  testing.  Fact Sheet for Patients: PinkCheek.be  Fact Sheet for Healthcare Providers: GravelBags.it  This test is not yet approved or cleared by the [place] FDA and  has been authorized for detection and/or diagnosis of SARS-CoV-2 by  FDA under an Emergency Use Authorization (EUA). This EUA will remain  in effect (meaning this test can be used) for the duration of the  Covid-19 declaration under Section 564(b)(1) of the Act, 21  U.S.C. section 360bbb-3(b)(1), unless the authorization is  terminated or revoked. Performed at [facility], [address], [place], [place] [address]   Comprehensive metabolic panel     Status: Abnormal   Collection Time: 01/22/20  6:02 AM  Result Value Ref Range   Sodium 135 135 - 145  mmol/L   Potassium 4.7 3.5 - 5.1 mmol/L   Chloride 104 98 - 111 mmol/L   CO2 26 22 - 32 mmol/L   Glucose, Bld 79 70 - 99 mg/dL  Comment: Glucose reference range applies only to samples taken after fasting for at least 8 hours.   BUN 27 (H) 8 - 23 mg/dL   Creatinine, Ser 1.36 (H) 0.44 - 1.00 mg/dL   Calcium 9.0 8.9 - 10.3 mg/dL   Total Protein 6.4 (L) 6.5 - 8.1 g/dL   Albumin 3.2 (L) 3.5 - 5.0 g/dL   AST 22 15 - 41 U/L   ALT 16 0 - 44 U/L   Alkaline Phosphatase 58 38 - 126 U/L   Total Bilirubin 0.8 0.3 - 1.2 mg/dL   GFR, Estimated 40 (L) >60 mL/min    Comment: (NOTE) Calculated using the CKD-EPI Creatinine Equation (2021)    Anion gap 5 5 - 15    Comment: Performed at [facility], [address] [place], [place] [address]  CBC     Status: Abnormal   Collection Time: 01/22/20  6:02 AM  Result Value Ref Range   WBC 6.5 4.0 - 10.5 K/uL   RBC 3.62 (L) 3.87 - 5.11 MIL/uL   Hemoglobin 10.7 (L) 12.0 - 15.0 g/dL   HCT 35.0 (L) 36 - 46 %   MCV 96.7 80.0 - 100.0 fL   MCH 29.6 26.0 - 34.0 pg   MCHC 30.6 30.0 - 36.0 g/dL   RDW 13.2 11.5 - 15.5 %   Platelets 173 150 - 400 K/uL   nRBC 0.0 0.0 - 0.2 %    Comment: Performed at [facility], [address] [place], [place] [address]  Protime-INR     Status: Abnormal   Collection Time: 01/22/20  6:02 AM  Result Value Ref Range   Prothrombin Time 16.6 (H) 11.4 - 15.2 seconds   INR 1.4 (H) 0.8 - 1.2    Comment: (NOTE) INR goal varies based on device and disease states. Performed at [facility], [address], [place], [place] [address]   APTT     Status: None   Collection Time: 01/22/20  6:02 AM  Result Value Ref Range   aPTT 33 24 - 36 seconds    Comment: Performed at [facility], [address], [place], [place] [address]  Magnesium     Status: None   Collection Time: 01/22/20  6:02 AM  Result Value Ref Range   Magnesium 1.9 1.7 - 2.4 mg/dL    Comment: Performed at [facility], [address] [place], [place] [address]   Phosphorus     Status: None   Collection Time: 01/22/20  6:02 AM  Result Value Ref Range   Phosphorus 3.4 2.5 - 4.6 mg/dL    Comment: Performed at [facility], [address], [place], [place] [address]   DG Chest 1 View  Result Date: 01/21/2020 CLINICAL DATA:  Increased short of breath rectal bleeding EXAM: CHEST  1 VIEW COMPARISON:  10/10/2018 FINDINGS: Surgical changes in the cervical spine. Small left greater than right pleural effusions. Left basilar airspace disease. Cardiomegaly with vascular congestion. Aortic atherosclerosis. No pneumothorax. IMPRESSION: Small left greater than right pleural effusions with left basilar atelectasis or pneumonia. Cardiomegaly with vascular congestion. Electronically Signed   By: Donavan Foil M.D.   On: 01/21/2020 19:00    Pending Labs Unresulted Labs (From admission, onward)         None      Vitals/Pain Today's Vitals   01/22/20 0900 01/22/20 0930 01/22/20 0949 01/22/20 1000  BP: 110/90 116/72  (!) 132/95  Pulse: 91 98  97  Resp: 16 (!) 27  (!) 23  Temp:      TempSrc:      SpO2: 99% 96%  98%  Weight:      Height:      PainSc:   0-No pain     Isolation Precautions No active isolations  Medications Medications  pantoprazole (PROTONIX) 80 mg in sodium chloride 0.9 % 100 mL (0.8 mg/mL) infusion (8 mg/hr Intravenous New Bag/Given 01/22/20 0836)  cefdinir (OMNICEF) 250 MG/5ML suspension 300 mg (300 mg Oral Given 01/21/20 2351)  atorvastatin (LIPITOR) tablet 10 mg (10 mg Oral Given 01/22/20 0957)  carvedilol (COREG) tablet 3.125 mg (3.125 mg Oral Given 01/22/20 0746)  melatonin tablet 6 mg (6 mg Oral Given 01/21/20 2358)  mometasone-formoterol (DULERA) 100-5 MCG/ACT inhaler 2 puff (2 puffs Inhalation Not Given 01/22/20 0817)    And  umeclidinium bromide (INCRUSE ELLIPTA) 62.5 MCG/INH 1 puff (1 puff Inhalation Not Given 01/22/20 0817)  pantoprazole (PROTONIX) 80 mg in sodium chloride 0.9 % 100 mL IVPB (0 mg Intravenous Stopped  01/21/20 2145)  furosemide (LASIX) injection 20 mg (20 mg Intravenous Given 01/21/20 2359)    Mobility power wheelchair High fall risk   Focused Assessments    R Recommendations: See Admitting Provider Note  Report given to:   Additional Notes:

## 2020-01-22 NOTE — Evaluation (Signed)
Physical Therapy Evaluation Patient Details Name: Brandy [name] MRN: [id number] DOB: September 10, 1940 Today's Date: 01/22/2020   History of Present Illness  Brandy [name] is a 79 y.o. female with medical history significant for hypertension and hypertensive heart disease, systolic and diastolic CHF with EF 51%, hypertension, paroxysmal atrial fibrillation, stroke with left hemiparesis, COPD, and hyperlipidemia who presents to the emergency department accompanied by her husband due to 3-day onset of rectal bleeding.  Most of the history was obtained from the husband, wife also participated in providing history with short sentences and with nodding of head to yes/no questions.  Per husband, patient was noted to present with blood in stool since last 3 days and her home health nurse asked her to go to the ED for further evaluation.  She complained of 2-week onset of increased shortness of breath and was recently diagnosed to have pneumonia and was being treated with cefdinir and azithromycin (patient already completed azithromycin, but still has 3 days to complete cefdinir).  Patient is bedbound and wheelchair-bound at baseline.    Clinical Impression  Patient demonstrates slow labored movement for sitting up at bedside, able to maintain sitting balance with feet unsupported while seated at bedside, unable to use left side due to paralysis and put back to bed after therapy with her spouse present in room.  Patient will benefit from continued physical therapy in hospital and recommended venue below to increase strength, balance, endurance for safe ADLs and gait.     Follow Up Recommendations Home health PT;Supervision/Assistance - 24 hour;Supervision for mobility/OOB    Equipment Recommendations  None recommended by PT    Recommendations for Other Services       Precautions / Restrictions Precautions Precautions: Fall Restrictions Weight Bearing Restrictions: No      Mobility  Bed  Mobility Overal bed mobility: Needs Assistance Bed Mobility: Supine to Sit;Sit to Supine     Supine to sit: Max assist Sit to supine: Max assist   General bed mobility comments: slow labored movement    Transfers                    Ambulation/Gait                Stairs            Wheelchair Mobility    Modified Rankin (Stroke Patients Only)       Balance Overall balance assessment: Needs assistance Sitting-balance support: No upper extremity supported;Feet unsupported Sitting balance-Leahy Scale: Fair Sitting balance - Comments: seated at EOB                                     Pertinent Vitals/Pain Pain Assessment: No/denies pain    Home Living Family/patient expects to be discharged to:: Private residence Living Arrangements: Spouse/significant other Available Help at Discharge: Family;Available 24 hours/day Type of Home: House Home Access: Ramped entrance     Home Layout: One level Home Equipment: Wheelchair - [employer] - 2 wheels Additional Comments: family assist with sponge baths in bed    Prior Function Level of Independence: Needs assistance   Gait / Transfers Assistance Needed: non-ambulatory since stroke, Max assist for transfers or use mechanical lift  ADL's / Homemaking Assistance Needed: assisted by family        Hand Dominance   Dominant Hand: Right    Extremity/Trunk Assessment   Upper Extremity Assessment Upper Extremity  Assessment: Defer to OT evaluation    Lower Extremity Assessment Lower Extremity Assessment: Generalized weakness;RLE deficits/detail;LLE deficits/detail RLE Deficits / Details: grossly 3+/5 RLE Sensation: WNL RLE Coordination: WNL LLE Deficits / Details: grossly 0/5 LLE Sensation: decreased light touch;decreased proprioception LLE Coordination: decreased fine motor;decreased gross motor    Cervical / Trunk Assessment Cervical / Trunk Assessment: Kyphotic   Communication   Communication: No difficulties  Cognition Arousal/Alertness: Awake/alert Behavior During Therapy: WFL for tasks assessed/performed Overall Cognitive Status: Within Functional Limits for tasks assessed                                        General Comments      Exercises     Assessment/Plan    PT Assessment Patient needs continued PT services  PT Problem List Decreased strength;Decreased range of motion;Decreased activity tolerance;Decreased balance;Decreased mobility;Decreased knowledge of use of DME       PT Treatment Interventions DME instruction;Functional mobility training;Therapeutic activities;Therapeutic exercise;Balance training    PT Goals (Current goals can be found in the Care Plan section)  Acute Rehab PT Goals Patient Stated Goal: return home with family to assist PT Goal Formulation: With patient/family Time For Goal Achievement: 01/29/20 Potential to Achieve Goals: Good    Frequency Min 3X/week   Barriers to discharge        Co-evaluation               AM-PAC PT "6 Clicks" Mobility  Outcome Measure Help needed turning from your back to your side while in a flat bed without using bedrails?: A Lot Help needed moving from lying on your back to sitting on the side of a flat bed without using bedrails?: A Lot Help needed moving to and from a bed to a chair (including a wheelchair)?: Total Help needed standing up from a chair using your arms (e.g., wheelchair or bedside chair)?: Total Help needed to walk in hospital room?: Total Help needed climbing 3-5 steps with a railing? : Total 6 Click Score: 8    End of Session   Activity Tolerance: Patient tolerated treatment well;Patient limited by fatigue Patient left: in bed Nurse Communication: Mobility status PT Visit Diagnosis: Unsteadiness on feet (R26.81);Muscle weakness (generalized) (M62.81);Hemiplegia and hemiparesis Hemiplegia - Right/Left: Left Hemiplegia -  dominant/non-dominant: Non-dominant Hemiplegia - caused by: Cerebral infarction    Time: 1035-1101 PT Time Calculation (min) (ACUTE ONLY): 26 min   Charges:   PT Evaluation $PT Eval Moderate Complexity: 1 Mod PT Treatments $Therapeutic Activity: 23-37 mins        2:23 PM, 01/22/20 Lonell Grandchild, MPT Physical Therapist with [facility] [phone] office [phone] mobile phone

## 2020-01-22 NOTE — Plan of Care (Signed)
  Problem: Acute Rehab PT Goals(only PT should resolve) Goal: Pt will Roll Supine to Side Outcome: Progressing Flowsheets (Taken 01/22/2020 1442) Pt will Roll Supine to Side: with mod assist Goal: Pt Will Go Supine/Side To Sit Outcome: Progressing Flowsheets (Taken 01/22/2020 1442) Pt will go Supine/Side to Sit:  with moderate assist  with maximum assist Goal: Pt Will Go Sit To Supine/Side Outcome: Progressing Flowsheets (Taken 01/22/2020 1442) Pt will go Sit to Supine/Side:  with moderate assist  with maximum assist Goal: Patient Will Perform Sitting Balance Outcome: Progressing Flowsheets (Taken 01/22/2020 1442) Patient will perform sitting balance:  with modified independence  with supervision Goal: Patient Will Transfer Sit To/From Stand Outcome: Progressing Flowsheets (Taken 01/22/2020 1442) Patient will transfer sit to/from stand: with maximum assist Goal: Pt Will Transfer Bed To Chair/Chair To Bed Outcome: Progressing Flowsheets (Taken 01/22/2020 1442) Pt will Transfer Bed to Chair/Chair to Bed: with max assist   2:44 PM, 01/22/20 Lonell Grandchild, MPT Physical Therapist with [facility] [phone] office [phone] mobile phone

## 2020-01-23 ENCOUNTER — Inpatient Hospital Stay (HOSPITAL_COMMUNITY): Payer: Medicare PPO | Admitting: Anesthesiology

## 2020-01-23 ENCOUNTER — Encounter (HOSPITAL_COMMUNITY): Payer: Self-pay | Admitting: Internal Medicine

## 2020-01-23 ENCOUNTER — Encounter (HOSPITAL_COMMUNITY)
Admission: EM | Disposition: A | Discharge status: Home Health | Payer: Self-pay | Source: Home / Self Care | Attending: Family Medicine

## 2020-01-23 DIAGNOSIS — N1832 Chronic kidney disease, stage 3b: Secondary | ICD-10-CM | POA: Diagnosis not present

## 2020-01-23 DIAGNOSIS — K3189 Other diseases of stomach and duodenum: Secondary | ICD-10-CM | POA: Diagnosis not present

## 2020-01-23 DIAGNOSIS — Z931 Gastrostomy status: Secondary | ICD-10-CM

## 2020-01-23 DIAGNOSIS — K2960 Other gastritis without bleeding: Secondary | ICD-10-CM | POA: Diagnosis not present

## 2020-01-23 DIAGNOSIS — K625 Hemorrhage of anus and rectum: Secondary | ICD-10-CM

## 2020-01-23 DIAGNOSIS — I482 Chronic atrial fibrillation, unspecified: Secondary | ICD-10-CM

## 2020-01-23 DIAGNOSIS — D123 Benign neoplasm of transverse colon: Secondary | ICD-10-CM | POA: Diagnosis not present

## 2020-01-23 DIAGNOSIS — K922 Gastrointestinal hemorrhage, unspecified: Secondary | ICD-10-CM | POA: Diagnosis not present

## 2020-01-23 DIAGNOSIS — E782 Mixed hyperlipidemia: Secondary | ICD-10-CM

## 2020-01-23 DIAGNOSIS — K5730 Diverticulosis of large intestine without perforation or abscess without bleeding: Secondary | ICD-10-CM

## 2020-01-23 DIAGNOSIS — I5042 Chronic combined systolic (congestive) and diastolic (congestive) heart failure: Secondary | ICD-10-CM | POA: Diagnosis not present

## 2020-01-23 DIAGNOSIS — J189 Pneumonia, unspecified organism: Secondary | ICD-10-CM

## 2020-01-23 HISTORY — PX: POLYPECTOMY: SHX5525

## 2020-01-23 HISTORY — PX: COLONOSCOPY WITH PROPOFOL: SHX5780

## 2020-01-23 HISTORY — PX: ESOPHAGOGASTRODUODENOSCOPY (EGD) WITH PROPOFOL: SHX5813

## 2020-01-23 LAB — BASIC METABOLIC PANEL
Anion gap: 9 (ref 5–15)
BUN: 27 mg/dL — ABNORMAL HIGH (ref 8–23)
CO2: 25 mmol/L (ref 22–32)
Calcium: 9.2 mg/dL (ref 8.9–10.3)
Chloride: 103 mmol/L (ref 98–111)
Creatinine, Ser: 1.4 mg/dL — ABNORMAL HIGH (ref 0.44–1.00)
GFR, Estimated: 39 mL/min — ABNORMAL LOW (ref >60)
Glucose, Bld: 72 mg/dL (ref 70–99)
Potassium: 4.6 mmol/L (ref 3.5–5.1)
Sodium: 137 mmol/L (ref 135–145)

## 2020-01-23 LAB — CBC
HCT: 36.4 % (ref 36.0–46.0)
Hemoglobin: 11 g/dL — ABNORMAL LOW (ref 12.0–15.0)
MCH: 29.9 pg (ref 26.0–34.0)
MCHC: 30.2 g/dL (ref 30.0–36.0)
MCV: 98.9 fL (ref 80.0–100.0)
Platelets: 185 10*3/uL (ref 150–400)
RBC: 3.68 MIL/uL — ABNORMAL LOW (ref 3.87–5.11)
RDW: 13.2 % (ref 11.5–15.5)
WBC: 4.9 10*3/uL (ref 4.0–10.5)
nRBC: 0 % (ref 0.0–0.2)

## 2020-01-23 LAB — MAGNESIUM: Magnesium: 2 mg/dL (ref 1.7–2.4)

## 2020-01-23 SURGERY — ESOPHAGOGASTRODUODENOSCOPY (EGD) WITH PROPOFOL
Anesthesia: General

## 2020-01-23 MED ORDER — STERILE WATER FOR IRRIGATION IR SOLN
Status: DC | PRN
  Administered 2020-01-23: 200 mL

## 2020-01-23 MED ORDER — LACTATED RINGERS IV SOLN: INTRAVENOUS | Status: DC | PRN

## 2020-01-23 MED ORDER — LACTATED RINGERS IV SOLN
INTRAVENOUS | Status: DC
  Administered 2020-01-23: 1000 mL via INTRAVENOUS

## 2020-01-23 MED ORDER — PROPOFOL 500 MG/50ML IV EMUL
INTRAVENOUS | Status: DC | PRN
  Administered 2020-01-23: 100 ug/kg/min via INTRAVENOUS

## 2020-01-23 MED ORDER — SODIUM CHLORIDE 0.9 % IV SOLN: INTRAVENOUS | Status: DC

## 2020-01-23 MED ORDER — GLYCOPYRROLATE PF 0.2 MG/ML IJ SOSY
PREFILLED_SYRINGE | INTRAMUSCULAR | Status: AC
  Filled 2020-01-23: qty 1

## 2020-01-23 MED ORDER — PANTOPRAZOLE SODIUM 40 MG PO TBEC
40.0000 mg | DELAYED_RELEASE_TABLET | Freq: Every day | ORAL | Status: DC
  Administered 2020-01-24: 40 mg via ORAL
  Filled 2020-01-23 (×2): qty 1

## 2020-01-23 MED ORDER — PROPOFOL 10 MG/ML IV BOLUS
INTRAVENOUS | Status: DC | PRN
  Administered 2020-01-23: 30 mg via INTRAVENOUS
  Administered 2020-01-23: 70 mg via INTRAVENOUS

## 2020-01-23 MED ORDER — PHENYLEPHRINE 40 MCG/ML (10ML) SYRINGE FOR IV PUSH (FOR BLOOD PRESSURE SUPPORT)
PREFILLED_SYRINGE | INTRAVENOUS | Status: DC | PRN
  Administered 2020-01-23 (×6): 120 ug via INTRAVENOUS
  Administered 2020-01-23: 80 ug via INTRAVENOUS
  Administered 2020-01-23 (×3): 120 ug via INTRAVENOUS

## 2020-01-23 MED ORDER — GLYCOPYRROLATE PF 0.2 MG/ML IJ SOSY
PREFILLED_SYRINGE | INTRAMUSCULAR | Status: DC | PRN
  Administered 2020-01-23: .1 mg via INTRAVENOUS

## 2020-01-23 MED ORDER — LIDOCAINE HCL (CARDIAC) PF 100 MG/5ML IV SOSY
PREFILLED_SYRINGE | INTRAVENOUS | Status: DC | PRN
  Administered 2020-01-23: 50 mg via INTRAVENOUS

## 2020-01-23 NOTE — Anesthesia Postprocedure Evaluation (Signed)
Anesthesia Post Note  Patient: [name]  Procedure(s) Performed: ESOPHAGOGASTRODUODENOSCOPY (EGD) WITH PROPOFOL (N/A ) COLONOSCOPY WITH PROPOFOL (N/A ) POLYPECTOMY  Patient location during evaluation: PACU Anesthesia Type: General Level of consciousness: awake and alert and oriented Pain management: pain level controlled Vital Signs Assessment: post-procedure vital signs reviewed and stable Respiratory status: spontaneous breathing, nonlabored ventilation and respiratory function stable Cardiovascular status: blood pressure returned to baseline and stable Postop Assessment: no apparent nausea or vomiting Anesthetic complications: no   No complications documented.   Last Vitals:  Vitals:   01/23/20 1430 01/23/20 1445  BP: 92/60 (!) 93/54  Pulse: 80   Resp: 14 16  Temp:    SpO2: 100% 98%    Last Pain:  Vitals:   01/23/20 1420  TempSrc:   PainSc: Asleep                 Orlie Dakin

## 2020-01-23 NOTE — Progress Notes (Signed)
Brief EGD and colonoscopy findings.  Erosive antral gastritis otherwise normal EGD. Gastrostomy tube in place.  Colonoscopy completed to cecum. Operation satisfactory except and rectosigmoid area where she had a lot of stool. 12 mm broad-based polyp hot snared from proximal transverse colon after elevation with eleview. Polyp retrieved with Jabier Mutton net. 2 hemoclips applied to close the polypectomy sites. 10 mm pedunculated polyp removed from splenic flexure. Single Hemoclip applied. Polyp retrieved. Multiple diverticula at sigmoid and descending colon. Scattered diverticula through rest of the colon.  Patient tolerated the procedures well.

## 2020-01-23 NOTE — Transfer of Care (Signed)
Immediate Anesthesia Transfer of Care Note  Patient: Brandy [name]  Procedure(s) Performed: ESOPHAGOGASTRODUODENOSCOPY (EGD) WITH PROPOFOL (N/A ) COLONOSCOPY WITH PROPOFOL (N/A ) POLYPECTOMY  Patient Location: PACU  Anesthesia Type:General  Level of Consciousness: drowsy  Airway & Oxygen Therapy: Patient Spontanous Breathing and Patient connected to nasal cannula oxygen  Post-op Assessment: Report given to RN and Post -op Vital signs reviewed and stable  Post vital signs: Reviewed and stable  Last Vitals:  Vitals Value Taken Time  BP 77/45 01/23/20 1420  Temp    Pulse 88 01/23/20 1421  Resp 18 01/23/20 1421  SpO2 99 % 01/23/20 1421  Vitals shown include unvalidated device data.  Last Pain:  Vitals:   01/23/20 1322  TempSrc:   PainSc: 0-No pain      Patients Stated Pain Goal: 5 (61/22/44 9753)  Complications: No complications documented.

## 2020-01-23 NOTE — Progress Notes (Signed)
PROGRESS NOTE   Brandy [name]  TOI:[id number] DOB: 06-Aug-1940 DOA: 01/21/2020 PCP: [name], MD   Chief Complaint  Patient presents with  . Rectal Bleeding    Brief Admission History:  79 y.o.femalewith medical history significant forhypertension and hypertensive heart disease, systolicanddiastolic CHF with EF 83%, hypertension, paroxysmal atrial fibrillation, stroke with left hemiparesis, COPD, and hyperlipidemiawho presents to the emergency departmentaccompanied by her husbanddue to 3-day onset of rectal bleeding.  Assessment & Plan:   Principal Problem:   Acute GI bleeding Active Problems:   HTN (hypertension)   Pneumonia   Chronic combined systolic and diastolic congestive heart failure (HCC)   CKD (chronic kidney disease) stage 3, GFR 30-59 ml/min (HCC)   Hemiparesis affecting left side as late effect of stroke (HCC)   Atrial fibrillation, chronic (HCC)   Hyperlipidemia  1. Rectal Bleed - Pt has been prepped and is having EGD/colonoscopy later today.  Further recommendations to follow.  Continue IV protonix infusion for now.   2. CAP - Present on admission, completing course of cefdinir (5 day course).  3. Chronic atrial fibrillation - HR controlled with carvedilol.  Apixaban on temporary hold.  4. Essential hypertension - blood pressure is controlled.  5. Dyslipidemia - continue atorvastatin daily.  6. Stage 3b CKD - stable.  Following.  7. Chronic combined heart failure - LVEF 35-40% 8/20, currently appears compensated.  8. History of CVA with residual left sided hemiparesis - at baseline is mostly bedbound/wheelchair bound.  Pt remains on atorvastatin.  PT/OT evaluation ordered.  9. COPD - stable, resumed home medications.    DVT prophylaxis:  SCD Code Status:  Full  Family Communication:  Disposition: TBD  Status is: Inpatient  Remains inpatient appropriate because:IV treatments appropriate due to intensity of illness or inability to take PO and  Inpatient level of care appropriate due to severity of illness   Dispo: The patient is from: Home              Anticipated d/c is to: TBD              Anticipated d/c date is: 1 day              Patient currently is not medically stable to d/c.  Consultants:   GI   Procedures:   pending  Antimicrobials:     Subjective: Pt reports that she has no complaints at this time.  She is waiting for EGD/colon later today.    Objective: Vitals:   01/23/20 0513 01/23/20 0546 01/23/20 0741 01/23/20 0801  BP: 134/87 116/74    Pulse: 100 77    Resp: 18  17   Temp: 98.3 F (36.8 C)  98 F (36.7 C)   TempSrc: Oral  Oral   SpO2: 100% 100% 100% 98%  Weight:  58.6 kg    Height:        Intake/Output Summary (Last 24 hours) at 01/23/2020 0956 Last data filed at 01/23/2020 0300 Gross per 24 hour  Intake 252.26 ml  Output --  Net 252.26 ml   Filed Weights   01/21/20 1834 01/23/20 0546  Weight: 57.2 kg 58.6 kg    Examination:  General exam: Appears calm and comfortable  Respiratory system: Clear to auscultation. Respiratory effort normal. Cardiovascular system: S1 & S2 heard, RRR. No JVD, murmurs, rubs, gallops or clicks. No pedal edema. Gastrointestinal system: Abdomen is nondistended, soft and nontender. No organomegaly or masses felt. Normal bowel sounds heard. Central nervous system: Alert and  oriented. No focal neurological deficits. Extremities: Symmetric 5 x 5 power. Skin: No rashes, lesions or ulcers Psychiatry: Judgement and insight appear normal. Mood & affect appropriate.   Data Reviewed: I have personally reviewed following labs and imaging studies  CBC: Recent Labs  Lab 01/21/20 1903 01/22/20 0602 01/23/20 0330  WBC 6.5 6.5 4.9  HGB 11.2* 10.7* 11.0*  HCT 36.2 35.0* 36.4  MCV 95.8 96.7 98.9  PLT 226 173 983    Basic Metabolic Panel: Recent Labs  Lab 01/21/20 1903 01/22/20 0602 01/23/20 0330  NA 136 135 137  K 5.0 4.7 4.6  CL 101 104 103  CO2 27  26 25   GLUCOSE 97 79 72  BUN 29* 27* 27*  CREATININE 1.32* 1.36* 1.40*  CALCIUM 9.3 9.0 9.2  MG  --  1.9 2.0  PHOS  --  3.4  --     GFR: Estimated Creatinine Clearance: 29.1 mL/min (A) (by C-G formula based on SCr of 1.4 mg/dL (H)).  Liver Function Tests: Recent Labs  Lab 01/21/20 1903 01/22/20 0602  AST 23 22  ALT 18 16  ALKPHOS 58 58  BILITOT 0.7 0.8  PROT 6.6 6.4*  ALBUMIN 3.4* 3.2*    CBG: No results for input(s): GLUCAP in the last 168 hours.  Recent Results (from the past 240 hour(s))  Respiratory Panel by RT PCR (Flu A&B, Covid) - Nasopharyngeal Swab     Status: None   Collection Time: 01/21/20  8:23 PM   Specimen: Nasopharyngeal Swab  Result Value Ref Range Status   SARS Coronavirus 2 by RT PCR NEGATIVE NEGATIVE Final    Comment: (NOTE) SARS-CoV-2 target nucleic acids are NOT DETECTED.  The SARS-CoV-2 RNA is generally detectable in upper respiratoy specimens during the acute phase of infection. The lowest concentration of SARS-CoV-2 viral copies this assay can detect is 131 copies/mL. A negative result does not preclude SARS-Cov-2 infection and should not be used as the sole basis for treatment or other patient management decisions. A negative result may occur with  improper specimen collection/handling, submission of specimen other than nasopharyngeal swab, presence of viral mutation(s) within the areas targeted by this assay, and inadequate number of viral copies (<131 copies/mL). A negative result must be combined with clinical observations, patient history, and epidemiological information. The expected result is Negative.  Fact Sheet for Patients:  PinkCheek.be  Fact Sheet for Healthcare Providers:  GravelBags.it  This test is no t yet approved or cleared by the [place] FDA and  has been authorized for detection and/or diagnosis of SARS-CoV-2 by FDA under an Emergency Use Authorization  (EUA). This EUA will remain  in effect (meaning this test can be used) for the duration of the COVID-19 declaration under Section 564(b)(1) of the Act, 21 U.S.C. section 360bbb-3(b)(1), unless the authorization is terminated or revoked sooner.     Influenza A by PCR NEGATIVE NEGATIVE Final   Influenza B by PCR NEGATIVE NEGATIVE Final    Comment: (NOTE) The Xpert Xpress SARS-CoV-2/FLU/RSV assay is intended as an aid in  the diagnosis of influenza from Nasopharyngeal swab specimens and  should not be used as a sole basis for treatment. Nasal washings and  aspirates are unacceptable for Xpert Xpress SARS-CoV-2/FLU/RSV  testing.  Fact Sheet for Patients: PinkCheek.be  Fact Sheet for Healthcare Providers: GravelBags.it  This test is not yet approved or cleared by the [place] FDA and  has been authorized for detection and/or diagnosis of SARS-CoV-2 by  FDA under an Emergency  Use Authorization (EUA). This EUA will remain  in effect (meaning this test can be used) for the duration of the  Covid-19 declaration under Section 564(b)(1) of the Act, 21  U.S.C. section 360bbb-3(b)(1), unless the authorization is  terminated or revoked. Performed at [facility], [address] [place], [place] [address]      Radiology Studies: DG Chest 1 View  Result Date: 01/21/2020 CLINICAL DATA:  Increased short of breath rectal bleeding EXAM: CHEST  1 VIEW COMPARISON:  10/10/2018 FINDINGS: Surgical changes in the cervical spine. Small left greater than right pleural effusions. Left basilar airspace disease. Cardiomegaly with vascular congestion. Aortic atherosclerosis. No pneumothorax. IMPRESSION: Small left greater than right pleural effusions with left basilar atelectasis or pneumonia. Cardiomegaly with vascular congestion. Electronically Signed   By: Donavan Foil M.D.   On: 01/21/2020 19:00   Scheduled Meds: . atorvastatin  10 mg Oral  Daily  . carvedilol  3.125 mg Oral BID WC  . cefdinir  300 mg Oral BID  . melatonin  6 mg Oral QHS  . mometasone-formoterol  2 puff Inhalation BID   And  . umeclidinium bromide  1 puff Inhalation Daily   Continuous Infusions: . pantoprozole (PROTONIX) infusion 8 mg/hr (01/23/20 0855)     LOS: 2 days   Time spent: 33 minutes    Mindie Rawdon Wynetta Emery, MD How to contact the [facility] Attending or Consulting provider [place] [facility] [place] or covering provider during after hours [place], for this patient?  1. Check the care team in [facility] and look for a) attending/consulting [facility] provider listed and b) the [facility] team listed 2. Log into [email or web address] and use 's universal password to access. If you do not have the password, please contact the hospital operator. 3. Locate the [facility] provider you are looking for under [facility] and page to a number that you can be directly reached. 4. If you still have difficulty reaching the provider, please page the [facility] (Director on Call) for the Hospitalists listed on amion for assistance.  01/23/2020, 9:56 AM

## 2020-01-23 NOTE — Progress Notes (Addendum)
GI Inpatient Follow-up Note  Patient Identification: Brandy [name] is a 79 y.o. female with past medical history of CKD, anemia, CHF, atrial flutter on Eliquis, stroke with left hemiparesis, COPD on 2.5 L of oxygen, hypertension hyperlipidemia with PEG tube placed July 2020 for failure to thrive.  She was seen in the emergency room January 21, 2020 with 3 days of rectal bleeding.  She was admitted for further evaluation.  Husband also reported loss of appetite 1 to 2 weeks, she did continue with tube feeds during this time frame.   Subjective: Patient seen earlier this morning around 9 AM-she had 2 enemas around 6 AM and 7:30 AM.  At that time she had still brown liquid stool.  Discussed with nurse 1 additional tapwater enema prior to procedure.  Patient denies abdominal pain.  No further obvious bright red blood overnight.  Denies nausea vomiting.  Scheduled Inpatient Medications:  . atorvastatin  10 mg Oral Daily  . carvedilol  3.125 mg Oral BID WC  . cefdinir  300 mg Oral BID  . melatonin  6 mg Oral QHS  . mometasone-formoterol  2 puff Inhalation BID   And  . umeclidinium bromide  1 puff Inhalation Daily    Continuous Inpatient Infusions:   . pantoprozole (PROTONIX) infusion Stopped (01/23/20 0657)    PRN Inpatient Medications:  metoprolol tartrate, pneumococcal 23 valent vaccine  Review of Systems: Constitutional: Weight is stable.  Eyes: No changes in vision. ENT: No oral lesions, sore throat.  GI: see HPI.  Heme/Lymph: No easy bruising.  CV: No chest pain.  GU: No hematuria.  Integumentary: No rashes.  Neuro: No headaches.  Psych: No depression/anxiety.  Endocrine: No heat/cold intolerance.  Allergic/Immunologic: No urticaria.  Resp: No cough, SOB.  Musculoskeletal: No joint swelling.    Physical Examination: BP 116/74 (BP Location: Right Arm)   Pulse 77   Temp 98 F (36.7 C) (Oral)   Resp 17   Ht 5\' 4"  (1.626 m)   Wt 58.6 kg   SpO2 98%   BMI 22.18  kg/m  Gen: NAD, alert and oriented x 4 HEENT: PEERLA, EOMI, Neck: supple, no JVD or thyromegaly Chest: CTA bilaterally, no wheezes, crackles, or other adventitious sounds CV: RRR, no m/g/c/r Abd: soft, NT, ND, +BS in all four quadrants; no HSM, guarding, ridigity, or rebound tenderness Ext: no edema, well perfused with 2+ pulses, Skin: no rash or lesions noted Lymph: no LAD  Data: Lab Results  Component Value Date   WBC 4.9 01/23/2020   HGB 11.0 (L) 01/23/2020   HCT 36.4 01/23/2020   MCV 98.9 01/23/2020   PLT 185 01/23/2020   Recent Labs  Lab 01/21/20 1903 01/22/20 0602 01/23/20 0330  HGB 11.2* 10.7* 11.0*   Lab Results  Component Value Date   NA 137 01/23/2020   K 4.6 01/23/2020   CL 103 01/23/2020   CO2 25 01/23/2020   BUN 27 (H) 01/23/2020   CREATININE 1.40 (H) 01/23/2020   Lab Results  Component Value Date   ALT 16 01/22/2020   AST 22 01/22/2020   ALKPHOS 58 01/22/2020   BILITOT 0.8 01/22/2020   Recent Labs  Lab 01/22/20 0602  APTT 33  INR 1.4*   Assessment/Plan: Brandy [name] is a 79 y.o. female admitted for rectal bleeding x3 days.  Also being treated for pneumonia and currently on antibiotics (day #5 cefdinir).  Chest x-ray 10/25 with vascular congestion and received 1 dose of Lasix.  1.  GI bleed-at  home reported bright red rectal bleeding but rectal exam while inpatient concerning for maroon tint to dark stool suggesting more upper GI bleed.  Plan is for endoscopy/colonoscopy today.  She is on PPI infusion.  Hemoglobin has been stable during admission.  After prep she still has liquid brown stool and will plan for 1 additional tap water enema prior to procedure   Last dose of Eliquis was 10/25   Case discussed w/ Dr Laural Golden.  Please call with questions or concerns.    Ronney Asters, PA-C [facility]

## 2020-01-23 NOTE — Progress Notes (Signed)
OT Cancellation Note  Patient Details Name: Brandy [name] MRN: [id number] DOB: 01/07/1941   Cancelled Treatment:    Reason Eval/Treat Not Completed: OT screened, no needs identified, will sign off. Pt screened for OT needs. Pt is non-speaking at baseline due to expressive aphasia, is able to answer yes/no questions appropriately via head nodding/shaking. Pt reports she has an aide 7 days/week for 8+ hours, husband is also with pt 24/7. Pt is wheelchair bound at baseline, uses a mechanical lift for transfers since previous CVA. Pt reporting aide or husband/family assist with all ADLs, she is able to feed herself. Pt is at baseline with ADLs, no further OT services required at this time.    Guadelupe Sabin, OTR/L  [phone] 01/23/2020, 8:18 AM

## 2020-01-23 NOTE — Anesthesia Postprocedure Evaluation (Signed)
Anesthesia Post Note  Patient: Brandy [name]  Procedure(s) Performed: ESOPHAGOGASTRODUODENOSCOPY (EGD) WITH PROPOFOL (N/A ) COLONOSCOPY WITH PROPOFOL (N/A ) POLYPECTOMY  Patient location during evaluation: PACU Anesthesia Type: General Level of consciousness: awake and alert Pain management: pain level controlled Vital Signs Assessment: post-procedure vital signs reviewed and stable Respiratory status: spontaneous breathing, nonlabored ventilation, respiratory function stable and patient connected to nasal cannula oxygen Cardiovascular status: blood pressure returned to baseline and stable Postop Assessment: no apparent nausea or vomiting Anesthetic complications: no   No complications documented.   Last Vitals:  Vitals:   01/23/20 1430 01/23/20 1445  BP: 92/60 (!) 93/54  Pulse: 80   Resp: 14 16  Temp:    SpO2: 100% 98%    Last Pain:  Vitals:   01/23/20 1420  TempSrc:   PainSc: Asleep                 Raji P Pricilla Handler

## 2020-01-23 NOTE — Op Note (Signed)
[facility] Patient Name: Brandy [name] Procedure Date: 01/23/2020 1:30 PM MRN: [id number] Date of Birth: 19-Jun-1940 Attending MD: Hildred Laser , MD CSN: [id number] Age: 79 Admit Type: Outpatient Procedure:                Colonoscopy Indications:              Rectal bleeding Providers:                Hildred Laser, MD, Lambert Mody, Caprice Kluver,                            Kristine L. Risa Grill, Technician, Nelma Rothman,                            [employer]chant [employer] officer Referring MD:             Heath Lark, DO Medicines:                Propofol per Anesthesia Complications:            No immediate complications. Estimated Blood Loss:     Estimated blood loss: none. Procedure:                Pre-Anesthesia Assessment:                           - Prior to the procedure, a History and Physical                            was performed, and patient medications and                            allergies were reviewed. The patient's tolerance of                            previous anesthesia was also reviewed. The risks                            and benefits of the procedure and the sedation                            options and risks were discussed with the patient.                            All questions were answered, and informed consent                            was obtained. Prior Anticoagulants: The patient                            last took Eliquis (apixaban) 2 days prior to the                            procedure. ASA Grade Assessment: III - A patient  with severe systemic disease. After reviewing the                            risks and benefits, the patient was deemed in                            satisfactory condition to undergo the procedure.                           After obtaining informed consent, the colonoscope                            was passed under direct vision. Throughout the                            procedure, the patient's blood  pressure, pulse, and                            oxygen saturations were monitored continuously. The                            [id number]-[id number] ([id number]) scope was introduced through                            the anus and advanced to the the cecum, identified                            by appendiceal orifice and ileocecal valve. The                            colonoscopy was performed without difficulty. The                            patient tolerated the procedure well. The quality                            of the bowel preparation was good except the rectum                            was fair. The ileocecal valve, appendiceal orifice,                            and rectum were photographed. Scope In: 1:33:15 PM Scope Out: 2:14:14 PM Scope Withdrawal Time: 0 hours 33 minutes 12 seconds  Total Procedure Duration: 0 hours 40 minutes 59 seconds  Findings:      The perianal and digital rectal examinations were normal.      Multiple small and large-mouthed diverticula were found in the entire       colon.      A 12 mm polyp was found in the proximal transverse colon. The polyp was       multi-lobulated. Area was successfully injected with 3 mL Eleview for       lesion assessment, and this injection appeared to lift the lesion  adequately. The polyp was removed with a hot snare. Resection and       retrieval were complete. The pathology specimen was placed into Bottle       Number 1. To prevent bleeding after the polypectomy, two hemostatic       clips were successfully placed (MR conditional). There was no bleeding       during, or at the end, of the procedure.      A 10 mm polyp was found in the splenic flexure. The polyp was       pedunculated. The polyp was removed with a hot snare. Resection and       retrieval were complete. The pathology specimen was placed into Bottle       Number 2. To prevent bleeding after the polypectomy, one hemostatic clip       was successfully placed (MR  conditional). There was no bleeding during,       or at the end, of the procedure.      The retroflexed view of the distal rectum and anal verge was normal and       showed no anal or rectal abnormalities. Impression:               - Diverticulosis in the entire examined colon.                           - One 12 mm polyp in the proximal transverse colon,                            removed with a hot snare. Resected and retrieved.                            Injected. Clips (MR conditional) were placed.                           - One 10 mm polyp at the splenic flexure, removed                            with a hot snare. Resected and retrieved. Clip (MR                            conditional) was placed. Moderate Sedation:      Per Anesthesia Care Recommendation:           - Return patient to hospital ward for ongoing care.                           - Cardiac diet today.                           - Continue present medications.                           - Await pathology results.                           - Resume Eliquis (apixaban) at prior dose in 3 days.                           -  Repeat colonoscopy is recommended. The                            colonoscopy date will be determined after pathology                            results from today's exam become available for                            review. Procedure Code(s):        --- Professional ---                           [phone], Colonoscopy, flexible; with removal of                            tumor(s), polyp(s), or other lesion(s) by snare                            technique                           [id number], Colonoscopy, flexible; with directed                            submucosal injection(s), any substance Diagnosis Code(s):        --- Professional ---                           K63.5, Polyp of colon                           K62.5, Hemorrhage of anus and rectum                           K57.30, Diverticulosis of large  intestine without                            perforation or abscess without bleeding CPT copyright 2019 American Medical Association. All rights reserved. The codes documented in this report are preliminary and upon coder review may  be revised to meet current compliance requirements. Hildred Laser, MD Hildred Laser, MD 01/23/2020 2:42:27 PM This report has been signed electronically. Number of Addenda: 0

## 2020-01-23 NOTE — Anesthesia Procedure Notes (Signed)
Date/Time: 01/23/2020 1:26 PM Performed by: Orlie Dakin, CRNA Pre-anesthesia Checklist: Patient identified, Emergency Drugs available, Suction available and Patient being monitored Oxygen Delivery Method: Nasal cannula Induction Type: IV induction Placement Confirmation: positive ETCO2

## 2020-01-23 NOTE — Progress Notes (Signed)
Patient has no complaints. She says her breathing is at baseline.  She has chronic shortness of breath and uses oxygen at home. She denies chest pain or abdominal pain. She received 4 L of GoLYTELY during the night via PEG tube.  She was also given enemas this morning.  She is passing black liquid.  I suspect black liquid is because she has been on ferrous sulfate. She is alert and responds appropriate to questions. Cardiac exam with with regular rhythm normal S1-S2.  No murmur gallop noted. Auscultation lungs reveal vesicular breath sounds bilaterally without rales or rhonchi. Abdominal exam reveals PEG tube in place.  Abdomen is soft and nontender with organomegaly or masses She does not have peripheral edema. She has left hemiparesis.  She has left foot drop.  Hemoglobin 11 g this morning.  Patient stable to proceed with esophagogastroduodenoscopy and colonoscopy.

## 2020-01-23 NOTE — Op Note (Signed)
[facility] ([facility]) Patient Name: Brandy [name] Procedure Date: 01/23/2020 12:15 PM MRN: [id number] Date of Birth: May 21, 1940 Attending MD: Hildred Laser , MD CSN: [id number] Age: 79 Admit Type: Outpatient Procedure:                Upper GI endoscopy Indications:              Suspected upper gastrointestinal bleeding Providers:                Hildred Laser, MD, Lambert Mody, Kristine L.                            Risa Grill, Technician, Nelma Rothman, [employer] Referring MD:             Heath Lark, DO Medicines:                Propofol per Anesthesia Complications:            No immediate complications. Estimated Blood Loss:     Estimated blood loss: none. Procedure:                Pre-Anesthesia Assessment:                           - Prior to the procedure, a History and Physical                            was performed, and patient medications and                            allergies were reviewed. The patient's tolerance of                            previous anesthesia was also reviewed. The risks                            and benefits of the procedure and the sedation                            options and risks were discussed with the patient.                            All questions were answered, and informed consent                            was obtained. Prior Anticoagulants: The patient                            last took Eliquis (apixaban) 2 days prior to the                            procedure. ASA Grade Assessment: III - A patient                            with severe systemic disease. After reviewing the  risks and benefits, the patient was deemed in                            satisfactory condition to undergo the procedure.                           After obtaining informed consent, the endoscope was                            passed under direct vision. Throughout the                            procedure, the patient's blood pressure,  pulse, and                            oxygen saturations were monitored continuously. The                            [id number]-H190 ([id number]) scope was introduced through the                            mouth, and advanced to the second part of duodenum.                            The upper GI endoscopy was accomplished without                            difficulty. The patient tolerated the procedure                            well. Scope In: 1:23:05 PM Scope Out: 1:27:24 PM Total Procedure Duration: 0 hours 4 minutes 19 seconds  Findings:      The hypopharynx was normal.      The examined esophagus was normal.      The Z-line was regular and was found 42 cm from the incisors.      There was evidence of an intact gastrostomy with a patent G-tube present       in the gastric body and on the anterior wall of the stomach. This was       characterized by healthy appearing mucosa.      A few erosions with no stigmata of recent bleeding were found in the       gastric antrum and in the prepyloric region of the stomach.      The exam of the stomach was otherwise normal.      The duodenal bulb and second portion of the duodenum were normal. Impression:               - Normal hypopharynx.                           - Normal esophagus.                           - Z-line regular, 42 cm from the incisors.                           -  Intact gastrostomy with a patent G-tube present                            characterized by healthy appearing mucosa.                           - Erosive gastropathy with no stigmata of recent                            bleeding.                           - Normal duodenal bulb and second portion of the                            duodenum.                           - No specimens collected. Moderate Sedation:      Per Anesthesia Care Recommendation:           - Return patient to hospital ward for ongoing care.                           - Cardiac diet today.                            - Continue present medications.                           - Perform an H. pylori serology.                           - See the other procedure note for documentation of                            additional recommendations. Procedure Code(s):        --- Professional ---                           [phone], Esophagogastroduodenoscopy, flexible,                            transoral; diagnostic, including collection of                            specimen(s) by brushing or washing, when performed                            (separate procedure) Diagnosis Code(s):        --- Professional ---                           Z93.1, Gastrostomy status                           K31.89, Other diseases of stomach and duodenum CPT copyright 2019 [employer]. All rights reserved.  The codes documented in this report are preliminary and upon coder review may  be revised to meet current compliance requirements. Hildred Laser, MD Hildred Laser, MD 01/23/2020 2:36:43 PM This report has been signed electronically. Number of Addenda: 0

## 2020-01-23 NOTE — Anesthesia Preprocedure Evaluation (Signed)
Anesthesia Evaluation   Patient awake    Reviewed: Allergy & Precautions, NPO status , Patient's Chart, lab work & pertinent test results, reviewed documented beta blocker date and time   Airway Mallampati: II  TM Distance: >3 FB Neck ROM: Full    Dental no notable dental hx.    Pulmonary former smoker,    Pulmonary exam normal breath sounds clear to auscultation       Cardiovascular hypertension, +CHF (chronic)  Normal cardiovascular exam Rhythm:Regular Rate:Normal     Neuro/Psych CVA    GI/Hepatic   Endo/Other    Renal/GU      Musculoskeletal   Abdominal   Peds  Hematology  (+) anemia ,   Anesthesia Other Findings   Reproductive/Obstetrics                             Anesthesia Physical Anesthesia Plan  ASA: III  Anesthesia Plan: General   Post-op Pain Management:    Induction: Intravenous  PONV Risk Score and Plan:   Airway Management Planned: Nasal Cannula  Additional Equipment:   Intra-op Plan:   Post-operative Plan:   Informed Consent: I have reviewed the patients History and Physical, chart, labs and discussed the procedure including the risks, benefits and alternatives for the proposed anesthesia with the patient or authorized representative who has indicated his/her understanding and acceptance.     Dental advisory given  Plan Discussed with: CRNA  Anesthesia Plan Comments:         Anesthesia Quick Evaluation

## 2020-01-24 DIAGNOSIS — I5042 Chronic combined systolic (congestive) and diastolic (congestive) heart failure: Secondary | ICD-10-CM | POA: Diagnosis not present

## 2020-01-24 DIAGNOSIS — I482 Chronic atrial fibrillation, unspecified: Secondary | ICD-10-CM | POA: Diagnosis not present

## 2020-01-24 DIAGNOSIS — K922 Gastrointestinal hemorrhage, unspecified: Secondary | ICD-10-CM | POA: Diagnosis not present

## 2020-01-24 DIAGNOSIS — I10 Essential (primary) hypertension: Secondary | ICD-10-CM

## 2020-01-24 DIAGNOSIS — N1832 Chronic kidney disease, stage 3b: Secondary | ICD-10-CM | POA: Diagnosis not present

## 2020-01-24 LAB — CBC
HCT: 33.1 % — ABNORMAL LOW (ref 36.0–46.0)
Hemoglobin: 10.4 g/dL — ABNORMAL LOW (ref 12.0–15.0)
MCH: 29.8 pg (ref 26.0–34.0)
MCHC: 31.4 g/dL (ref 30.0–36.0)
MCV: 94.8 fL (ref 80.0–100.0)
Platelets: 203 10*3/uL (ref 150–400)
RBC: 3.49 MIL/uL — ABNORMAL LOW (ref 3.87–5.11)
RDW: 13.2 % (ref 11.5–15.5)
WBC: 5.8 10*3/uL (ref 4.0–10.5)
nRBC: 0 % (ref 0.0–0.2)

## 2020-01-24 MED ORDER — ELIQUIS 2.5 MG PO TABS: 2.5000 mg | ORAL_TABLET | Freq: Two times a day (BID) | ORAL | Status: AC

## 2020-01-24 MED ORDER — PANTOPRAZOLE SODIUM 40 MG PO TBEC: 40.0000 mg | DELAYED_RELEASE_TABLET | Freq: Every day | ORAL | 1 refills | Status: AC

## 2020-01-24 MED ORDER — CEFDINIR 250 MG/5ML PO SUSR: 500.0000 mg | Freq: Two times a day (BID) | ORAL | 0 refills | Status: DC

## 2020-01-24 MED ORDER — CEFDINIR 250 MG/5ML PO SUSR: 300.0000 mg | Freq: Two times a day (BID) | ORAL | 0 refills | Status: AC

## 2020-01-24 NOTE — Discharge Summary (Signed)
Physician Discharge Summary  WALLACE COGLIANO DVV:[id number] DOB: 11-22-1940 DOA: 01/21/2020  PCP: [name], MD  Admit date: 01/21/2020 Discharge date: 01/24/2020  Admitted From:  Home  Disposition: Home with  [facility]   Recommendations for Outpatient Follow-up:  1. Follow up with PCP in 1 weeks 2. Follow up with cardiology on 11/5 as scheduled 3. Follow up with GI as scheduled.  4. Please obtain CBC in 1-2 weeks  Home Health:  PT   Discharge Condition: STABLE   CODE STATUS: FULL    Brief Hospitalization Summary: Please see all hospital notes, images, labs for full details of the hospitalization. ADMISSION HPI: Brandy [name] is a 79 y.o. female with medical history significant for hypertension and hypertensive heart disease, systolicanddiastolic CHF with EF 62%, hypertension, paroxysmal atrial fibrillation, stroke with left hemiparesis, COPD, and hyperlipidemia who presents to the emergency department accompanied by her husband due to 3-day onset of rectal bleeding.  Most of the history was obtained from the husband, wife also participated in providing history with short sentences and with nodding of head to yes/no questions.  Per husband, patient was noted to present with blood in stool since last 3 days and her home health nurse asked her to go to the ED for further evaluation.  She complained of 2-week onset of increased shortness of breath and was recently diagnosed to have pneumonia and was being treated with cefdinir and azithromycin (patient already completed azithromycin, but still has 3 days to complete cefdinir).  Patient is bedbound and wheelchair-bound at baseline.  ED Course:  In the emergency department, she was tachypneic and tachycardic.  Work-up in the ED showed normocytic anemia, BUN/creatinine 29/1.32, hypoalbuminemia, fecal occult blood test was positive.  Chest x-ray showed a small left greater than right pleural effusion with left basilar atelectasis or pneumonia.   Cardiomegaly with vascular congestion noted. Gastroenterologist (Dr. Abbey Chatters) was consulted by ED physician and recommended Protonix plan to place patient on n.p.o. after midnight.  Hospitalist was asked to admit patient for further evaluation and management.  Hospital Course:  Pt was admitted with rectal bleeding on apixaban.  Pt had EGD and colonoscopy done by Dr. Laural Golden on 01/23/20 with results below.  Pt tolerated it well.  She was continued on cefdinir to complete course of therapy for CAP. She tolerated well.  Her apixaban was temporarily held.  GI says she can resume apixaban on 10/30.  PT evaluated her and recommended home health PT.   GI has seen her today and she has remained stable and they have signed off. Pt is stable to discharge home with home health.  Protonix 40 mg daily ordered. Follow up with PCP and GI outpatient for repeat CBC testing.    _________________________________________________________ Brief EGD and colonoscopy findings.  Dr. Laural Golden  01/23/20    Erosive antral gastritis otherwise normal EGD. Gastrostomy tube in place.  Colonoscopy completed to cecum. Operation satisfactory except and rectosigmoid area where she had a lot of stool. 12 mm broad-based polyp hot snared from proximal transverse colon after elevation with eleview. Polyp retrieved with Jabier Mutton net. 2 hemoclips applied to close the polypectomy sites. 10 mm pedunculated polyp removed from splenic flexure. Single Hemoclip applied. Polyp retrieved. Multiple diverticula at sigmoid and descending colon. Scattered diverticula through rest of the colon.  Patient tolerated the procedures well.  Discharge Diagnoses:  Principal Problem:   Acute GI bleeding Active Problems:   HTN (hypertension)   Pneumonia   Chronic combined systolic and diastolic congestive heart failure (  HCC)   CKD (chronic kidney disease) stage 3, GFR 30-59 ml/min (HCC)   Hemiparesis affecting left side as late effect of stroke [facility])    Atrial fibrillation, chronic (HCC)   Hyperlipidemia   Discharge Instructions:  Allergies as of 01/24/2020      Reactions   Lisinopril Swelling      Medication List    STOP taking these medications   lactobacillus acidophilus Tabs tablet     TAKE these medications   acetaminophen 325 MG tablet Commonly known as: TYLENOL Take 2 tablets (650 mg total) by mouth every 4 (four) hours as needed for mild pain (or temp > 37.5 C (99.5 F)). What changed:   when to take this  reasons to take this   atorvastatin 10 MG tablet Commonly known as: LIPITOR TAKE 1 TABLET BY MOUTH EVERY DAY AT 6 PM   Breztri Aerosphere 160-9-4.8 MCG/ACT Aero Generic drug: Budeson-Glycopyrrol-Formoterol Inhale 2 Inhalers into the lungs in the morning and at bedtime.   carvedilol 3.125 MG tablet Commonly known as: COREG Take 3.125 mg by mouth 2 (two) times daily with a meal.   cefdinir 250 MG/5ML suspension Commonly known as: OMNICEF Take 6 mLs (300 mg total) by mouth every 12 (twelve) hours for 1 dose. What changed: how much to take   cetirizine 10 MG tablet Commonly known as: ZYRTEC Take 10 mg by mouth daily.   citalopram 10 MG tablet Commonly known as: CELEXA Take 10 mg by mouth daily.   donepezil 5 MG tablet Commonly known as: ARICEPT Take 1 tablet by mouth at bedtime.   Eliquis 2.5 MG Tabs tablet Generic drug: apixaban Take 1 tablet (2.5 mg total) by mouth 2 (two) times daily. Start taking on: January 26, 2020 What changed: These instructions start on January 26, 2020. If you are unsure what to do until then, ask your doctor or other care provider.   feeding supplement (OSMOLITE 1.5 CAL) Liqd Place 120 mLs into feeding tube 5 (five) times daily.   ferrous sulfate 324 MG Tbec Take 324 mg by mouth. M/W/F   isosorbide mononitrate 30 MG 24 hr tablet Commonly known as: IMDUR TAKE 1/2 TABLET(15 MG) BY MOUTH DAILY   melatonin 5 MG Tabs Take by mouth.   multivitamin Liqd Take 15 mLs  by mouth daily.   pantoprazole 40 MG tablet Commonly known as: PROTONIX Take 1 tablet (40 mg total) by mouth daily before breakfast. Start taking on: January 25, 2020       Follow-up Information    Home, [facility] Follow up.   Specialty: Home Health Services Why:   PT- they will call to set up with 48 hours after discharge. Contact information: [address] [place] [address] [phone]        [name], MD. Schedule an appointment as soon as possible for a visit in 1 week(s).   Specialty: Family Medicine Why: Hospital Follow Up  Contact information: [address] [place] [address] [phone]              Allergies  Allergen Reactions  . Lisinopril Swelling   Allergies as of 01/24/2020      Reactions   Lisinopril Swelling      Medication List    STOP taking these medications   lactobacillus acidophilus Tabs tablet     TAKE these medications   acetaminophen 325 MG tablet Commonly known as: TYLENOL Take 2 tablets (650 mg total) by mouth every  4 (four) hours as needed for mild pain (or temp > 37.5 C (99.5 F)). What changed:   when to take this  reasons to take this   atorvastatin 10 MG tablet Commonly known as: LIPITOR TAKE 1 TABLET BY MOUTH EVERY DAY AT 6 PM   Breztri Aerosphere 160-9-4.8 MCG/ACT Aero Generic drug: Budeson-Glycopyrrol-Formoterol Inhale 2 Inhalers into the lungs in the morning and at bedtime.   carvedilol 3.125 MG tablet Commonly known as: COREG Take 3.125 mg by mouth 2 (two) times daily with a meal.   cefdinir 250 MG/5ML suspension Commonly known as: OMNICEF Take 6 mLs (300 mg total) by mouth every 12 (twelve) hours for 1 dose. What changed: how much to take   cetirizine 10 MG tablet Commonly known as: ZYRTEC Take 10 mg by mouth daily.   citalopram 10 MG tablet Commonly known as: CELEXA Take 10 mg by mouth daily.   donepezil 5 MG tablet Commonly known as: ARICEPT Take 1 tablet by mouth  at bedtime.   Eliquis 2.5 MG Tabs tablet Generic drug: apixaban Take 1 tablet (2.5 mg total) by mouth 2 (two) times daily. Start taking on: January 26, 2020 What changed: These instructions start on January 26, 2020. If you are unsure what to do until then, ask your doctor or other care provider.   feeding supplement (OSMOLITE 1.5 CAL) Liqd Place 120 mLs into feeding tube 5 (five) times daily.   ferrous sulfate 324 MG Tbec Take 324 mg by mouth. M/W/F   isosorbide mononitrate 30 MG 24 hr tablet Commonly known as: IMDUR TAKE 1/2 TABLET(15 MG) BY MOUTH DAILY   melatonin 5 MG Tabs Take by mouth.   multivitamin Liqd Take 15 mLs by mouth daily.   pantoprazole 40 MG tablet Commonly known as: PROTONIX Take 1 tablet (40 mg total) by mouth daily before breakfast. Start taking on: January 25, 2020       Procedures/Studies: DG Chest 1 View  Result Date: 01/21/2020 CLINICAL DATA:  Increased short of breath rectal bleeding EXAM: CHEST  1 VIEW COMPARISON:  10/10/2018 FINDINGS: Surgical changes in the cervical spine. Small left greater than right pleural effusions. Left basilar airspace disease. Cardiomegaly with vascular congestion. Aortic atherosclerosis. No pneumothorax. IMPRESSION: Small left greater than right pleural effusions with left basilar atelectasis or pneumonia. Cardiomegaly with vascular congestion. Electronically Signed   By: Donavan Foil M.D.   On: 01/21/2020 19:00      Subjective: Pt says she feels well. No complaints.  No black, maroon or bloody stools.   Discharge Exam: Vitals:   01/24/20 0826 01/24/20 0912  BP:  124/72  Pulse:  83  Resp:  16  Temp:  98.1 F (36.7 C)  SpO2: 91% 95%   Vitals:   01/23/20 2057 01/23/20 2354 01/24/20 0826 01/24/20 0912  BP:  126/86  124/72  Pulse:  85  83  Resp:  17  16  Temp:  98.9 F (37.2 C)  98.1 F (36.7 C)  TempSrc:  Oral  Oral  SpO2: 97% 93% 91% 95%  Weight:      Height:       General: Pt is alert, awake, not  in acute distress Cardiovascular: normal S1/S2 +, no rubs, no gallops Respiratory: CTA bilaterally, no wheezing, no rhonchi Abdominal: Soft, NT, ND, bowel sounds + Extremities: no edema, no cyanosis   The results of significant diagnostics from this hospitalization (including imaging, microbiology, ancillary and laboratory) are listed below for reference.     Microbiology: Recent  Results (from the past 240 hour(s))  Respiratory Panel by RT PCR (Flu A&B, Covid) - Nasopharyngeal Swab     Status: None   Collection Time: 01/21/20  8:23 PM   Specimen: Nasopharyngeal Swab  Result Value Ref Range Status   SARS Coronavirus 2 by RT PCR NEGATIVE NEGATIVE Final    Comment: (NOTE) SARS-CoV-2 target nucleic acids are NOT DETECTED.  The SARS-CoV-2 RNA is generally detectable in upper respiratoy specimens during the acute phase of infection. The lowest concentration of SARS-CoV-2 viral copies this assay can detect is 131 copies/mL. A negative result does not preclude SARS-Cov-2 infection and should not be used as the sole basis for treatment or other patient management decisions. A negative result may occur with  improper specimen collection/handling, submission of specimen other than nasopharyngeal swab, presence of viral mutation(s) within the areas targeted by this assay, and inadequate number of viral copies (<131 copies/mL). A negative result must be combined with clinical observations, patient history, and epidemiological information. The expected result is Negative.  Fact Sheet for Patients:  PinkCheek.be  Fact Sheet for Healthcare Providers:  GravelBags.it  This test is no t yet approved or cleared by the [place] FDA and  has been authorized for detection and/or diagnosis of SARS-CoV-2 by FDA under an Emergency Use Authorization (EUA). This EUA will remain  in effect (meaning this test can be used) for the duration of  the COVID-19 declaration under Section 564(b)(1) of the Act, 21 U.S.C. section 360bbb-3(b)(1), unless the authorization is terminated or revoked sooner.     Influenza A by PCR NEGATIVE NEGATIVE Final   Influenza B by PCR NEGATIVE NEGATIVE Final    Comment: (NOTE) The Xpert Xpress SARS-CoV-2/FLU/RSV assay is intended as an aid in  the diagnosis of influenza from Nasopharyngeal swab specimens and  should not be used as a sole basis for treatment. Nasal washings and  aspirates are unacceptable for Xpert Xpress SARS-CoV-2/FLU/RSV  testing.  Fact Sheet for Patients: PinkCheek.be  Fact Sheet for Healthcare Providers: GravelBags.it  This test is not yet approved or cleared by the [place] FDA and  has been authorized for detection and/or diagnosis of SARS-CoV-2 by  FDA under an Emergency Use Authorization (EUA). This EUA will remain  in effect (meaning this test can be used) for the duration of the  Covid-19 declaration under Section 564(b)(1) of the Act, 21  U.S.C. section 360bbb-3(b)(1), unless the authorization is  terminated or revoked. Performed at [facility], [address], [place], [place] [address]      Labs: BNP (last 3 results) No results for input(s): BNP in the last 8760 hours. Basic Metabolic Panel: Recent Labs  Lab 01/21/20 1903 01/22/20 0602 01/23/20 0330  NA 136 135 137  K 5.0 4.7 4.6  CL 101 104 103  CO2 27 26 25   GLUCOSE 97 79 72  BUN 29* 27* 27*  CREATININE 1.32* 1.36* 1.40*  CALCIUM 9.3 9.0 9.2  MG  --  1.9 2.0  PHOS  --  3.4  --    Liver Function Tests: Recent Labs  Lab 01/21/20 1903 01/22/20 0602  AST 23 22  ALT 18 16  ALKPHOS 58 58  BILITOT 0.7 0.8  PROT 6.6 6.4*  ALBUMIN 3.4* 3.2*   No results for input(s): LIPASE, AMYLASE in the last 168 hours. No results for input(s): AMMONIA in the last 168 hours. CBC: Recent Labs  Lab 01/21/20 1903 01/22/20 0602 01/23/20 0330  01/24/20 0607  WBC 6.5 6.5 4.9  5.8  HGB 11.2* 10.7* 11.0* 10.4*  HCT 36.2 35.0* 36.4 33.1*  MCV 95.8 96.7 98.9 94.8  PLT 226 173 185 203   Cardiac Enzymes: No results for input(s): CKTOTAL, CKMB, CKMBINDEX, TROPONINI in the last 168 hours. BNP: Invalid input(s): POCBNP CBG: No results for input(s): GLUCAP in the last 168 hours. D-Dimer No results for input(s): DDIMER in the last 72 hours. Hgb A1c No results for input(s): HGBA1C in the last 72 hours. Lipid Profile No results for input(s): CHOL, HDL, LDLCALC, TRIG, CHOLHDL, LDLDIRECT in the last 72 hours. Thyroid function studies No results for input(s): TSH, T4TOTAL, T3FREE, THYROIDAB in the last 72 hours.  Invalid input(s): FREET3 Anemia work up No results for input(s): VITAMINB12, FOLATE, FERRITIN, TIBC, IRON, RETICCTPCT in the last 72 hours. Urinalysis    Component Value Date/Time   COLORURINE YELLOW 10/10/2018 1951   APPEARANCEUR CLEAR 10/10/2018 1951   LABSPEC 1.014 10/10/2018 1951   PHURINE 5.0 10/10/2018 1951   GLUCOSEU NEGATIVE 10/10/2018 1951   HGBUR NEGATIVE 10/10/2018 [place] NEGATIVE 10/10/2018 [place] NEGATIVE 10/10/2018 1951   PROTEINUR NEGATIVE 10/10/2018 1951   NITRITE NEGATIVE 10/10/2018 1951   LEUKOCYTESUR NEGATIVE 10/10/2018 1951   Sepsis Labs Invalid input(s): PROCALCITONIN,  WBC,  LACTICIDVEN Microbiology Recent Results (from the past 240 hour(s))  Respiratory Panel by RT PCR (Flu A&B, Covid) - Nasopharyngeal Swab     Status: None   Collection Time: 01/21/20  8:23 PM   Specimen: Nasopharyngeal Swab  Result Value Ref Range Status   SARS Coronavirus 2 by RT PCR NEGATIVE NEGATIVE Final    Comment: (NOTE) SARS-CoV-2 target nucleic acids are NOT DETECTED.  The SARS-CoV-2 RNA is generally detectable in upper respiratoy specimens during the acute phase of infection. The lowest concentration of SARS-CoV-2 viral copies this assay can detect is 131 copies/mL. A negative result does  not preclude SARS-Cov-2 infection and should not be used as the sole basis for treatment or other patient management decisions. A negative result may occur with  improper specimen collection/handling, submission of specimen other than nasopharyngeal swab, presence of viral mutation(s) within the areas targeted by this assay, and inadequate number of viral copies (<131 copies/mL). A negative result must be combined with clinical observations, patient history, and epidemiological information. The expected result is Negative.  Fact Sheet for Patients:  PinkCheek.be  Fact Sheet for Healthcare Providers:  GravelBags.it  This test is no t yet approved or cleared by the [place] FDA and  has been authorized for detection and/or diagnosis of SARS-CoV-2 by FDA under an Emergency Use Authorization (EUA). This EUA will remain  in effect (meaning this test can be used) for the duration of the COVID-19 declaration under Section 564(b)(1) of the Act, 21 U.S.C. section 360bbb-3(b)(1), unless the authorization is terminated or revoked sooner.     Influenza A by PCR NEGATIVE NEGATIVE Final   Influenza B by PCR NEGATIVE NEGATIVE Final    Comment: (NOTE) The Xpert Xpress SARS-CoV-2/FLU/RSV assay is intended as an aid in  the diagnosis of influenza from Nasopharyngeal swab specimens and  should not be used as a sole basis for treatment. Nasal washings and  aspirates are unacceptable for Xpert Xpress SARS-CoV-2/FLU/RSV  testing.  Fact Sheet for Patients: PinkCheek.be  Fact Sheet for Healthcare Providers: GravelBags.it  This test is not yet approved or cleared by the [place] FDA and  has been authorized for detection and/or diagnosis of SARS-CoV-2 by  FDA under an Emergency Use  Authorization (EUA). This EUA will remain  in effect (meaning this test can be used) for the  duration of the  Covid-19 declaration under Section 564(b)(1) of the Act, 21  U.S.C. section 360bbb-3(b)(1), unless the authorization is  terminated or revoked. Performed at [facility], [address], [place], [place] [address]     Time coordinating discharge: 40 mins  SIGNED:  Irwin Brakeman, MD  [facility] 01/24/2020, 11:52 AM How to contact the [facility] Attending or Consulting provider [place] or covering provider during after hours [place], for this patient?  1. Check the care team in [facility] and look for a) attending/consulting [facility] provider listed and b) the [facility] team listed 2. Log into [email or web address] and use [place]'s universal password to access. If you do not have the password, please contact the hospital operator. 3. Locate the [facility] provider you are looking for under [facility] and page to a number that you can be directly reached. 4. If you still have difficulty reaching the provider, please page the [facility] (Director on Call) for the Hospitalists listed on amion for assistance.

## 2020-01-24 NOTE — Discharge Instructions (Signed)
Restart Eliquis (apixaban) on Saturday 01/26/20  Gastrointestinal Bleeding Gastrointestinal (GI) bleeding is bleeding somewhere along the digestive tract, between the mouth and the anus. This tract includes the mouth, esophagus, stomach, small intestine, large intestine, and anus. The large intestine is often called the colon. GI bleeding can be caused by various problems. The severity of these problems can range from mild to serious or even life-threatening. If you have GI bleeding, you may find blood in your stools (feces), you may have black stools, or you may vomit blood. If there is a lot of bleeding, you may need to stay in the hospital. What are the causes? This condition may be caused by:  Inflammation, irritation, or swelling of the esophagus (esophagitis). The esophagus is part of the body that moves food from your mouth to your stomach.  Swollen veins in the rectum (hemorrhoids).  Areas of painful tearing in the anus that are often caused by passing hard stool (anal fissures).  Pouches that form on the colon over time, with age, and may bleed a lot (diverticulosis).  Inflammation (diverticulitis) in areas with diverticulosis. This can cause pain, fever, and bloody stools, although bleeding may be mild.  Growths (polyps) or cancer. Colon cancer often starts out as precancerous polyps.  Gastritis and ulcers. With these, bleeding may come from the upper GI tract, near the stomach. What increases the risk? You are more likely to develop this condition if you:  Have an infection in your stomach from a type of bacteria called Helicobacter pylori.  Take certain medicines, such as: ? NSAIDs. ? Aspirin. ? Selective serotonin reuptake inhibitors (SSRIs). ? Steroids. ? Antiplatelet or anticoagulant medicines.  Smoke.  Drink alcohol. What are the signs or symptoms? Common symptoms of this condition include:  Bright red blood in your vomit, or vomit that looks like coffee  grounds.  Bloody, black, or tarry stools. ? Bleeding from the lower GI tract will usually cause red or maroon blood in the stools. ? Bleeding from the upper GI tract may cause black, tarry stools that are often stronger smelling than usual. ? In certain cases, if the bleeding is fast enough, the stools may be red.  Pain or cramping in the abdomen. How is this diagnosed? This condition may be diagnosed based on:  Your medical history and a physical exam.  Various tests, such as: ? Blood tests. ? Stool tests. ? X-rays and other imaging tests. ? Esophagogastroduodenoscopy (EGD). In this test, a flexible, lighted tube is used to look at your esophagus, stomach, and small intestine. ? Colonoscopy. In this test, a flexible, lighted tube is used to look at your colon. How is this treated? Treatment for this condition depends on the cause of the bleeding. For example:  For bleeding from the esophagus, stomach, small intestine, or colon, the health care provider doing your EGD or colonoscopy may be able to stop the bleeding as part of the procedure.  Inflammation or infection of the colon can be treated with medicines.  Certain rectal problems can be treated with creams, suppositories, or warm baths.  Medicines may be given to reduce acid in your stomach.  Surgery is sometimes needed.  Blood transfusions are sometimes needed if a lot of blood has been lost. If bleeding is mild, you may be allowed to go home. If there is a lot of bleeding, you will need to stay in the hospital for observation. Follow these instructions at home:   Take over-the-counter and prescription medicines only as  told by your health care provider.  Eat foods that are high in fiber, such as beans, whole grains, and fresh fruits and vegetables. This will help to keep your stools soft. Eating 1-3 prunes each day works well for many people.  Drink enough fluid to keep your urine pale yellow.  Keep all follow-up  visits as told by your health care provider. This is important. Contact a health care provider if:  Your symptoms do not improve. Get help right away if:  Your bleeding does not stop.  You feel light-headed or you faint.  You feel weak.  You have severe cramps in your back or abdomen.  You pass large blood clots in your stool.  Your symptoms are getting worse.  You have chest pain or fast heartbeats. Summary  Gastrointestinal (GI) bleeding is bleeding somewhere along the digestive tract, between the mouth and anus. GI bleeding can be caused by various problems. The severity of these problems can range from mild to serious or even life-threatening.  Treatment for this condition depends on the cause of the bleeding.  Take over-the-counter and prescription medicines only as told by your health care provider.  Keep all follow-up visits as told by your health care provider. This is important.  Get help right away if your bleeding increases, your symptoms are getting worse, or you have new symptoms. This information is not intended to replace advice given to you by your health care provider. Make sure you discuss any questions you have with your health care provider. Document Revised: 10/26/2017 Document Reviewed: 10/26/2017 Elsevier Patient Education  [place].

## 2020-01-24 NOTE — Progress Notes (Signed)
Physical Therapy Treatment Patient Details Name: Brandy [name] MRN: [id number] DOB: 1940/08/18 Today's Date: 01/24/2020    History of Present Illness Brandy [name] is a 79 y.o. female with medical history significant for hypertension and hypertensive heart disease, systolic and diastolic CHF with EF 78%, hypertension, paroxysmal atrial fibrillation, stroke with left hemiparesis, COPD, and hyperlipidemia who presents to the emergency department accompanied by her husband due to 3-day onset of rectal bleeding.  Most of the history was obtained from the husband, wife also participated in providing history with short sentences and with nodding of head to yes/no questions.  Per husband, patient was noted to present with blood in stool since last 3 days and her home health nurse asked her to go to the ED for further evaluation.  She complained of 2-week onset of increased shortness of breath and was recently diagnosed to have pneumonia and was being treated with cefdinir and azithromycin (patient already completed azithromycin, but still has 3 days to complete cefdinir).  Patient is bedbound and wheelchair-bound at baseline.    PT Comments    Patient demonstrates fair/good return for completing RLE strengthening/ROM exercises while seated at bedside supporting self with RUE, unable to fully stand on RLE due to weakness or use left side for functional mobility due to paralysis.  Patient required Max assist to stand pivot to chair with right knee blocked and tolerated sitting up with her spouse in room after therapy - RN aware.  Patient will benefit from continued physical therapy in hospital and recommended venue below to increase strength, balance, endurance for safe ADLs and gait.   Follow Up Recommendations  Home health PT;Supervision/Assistance - 24 hour;Supervision for mobility/OOB     Equipment Recommendations  None recommended by PT    Recommendations for Other Services        Precautions / Restrictions Precautions Precautions: Fall Restrictions Weight Bearing Restrictions: No    Mobility  Bed Mobility Overal bed mobility: Needs Assistance Bed Mobility: Supine to Sit     Supine to sit: Max assist     General bed mobility comments: slow labored movement  Transfers Overall transfer level: Needs assistance Equipment used: 1 person hand held assist Transfers: Sit to/from Stand;Stand Pivot Transfers Sit to Stand: Max assist Stand pivot transfers: Max assist       General transfer comment: able to pivot on RLE with knee blocked  Ambulation/Gait                 Stairs             Wheelchair Mobility    Modified Rankin (Stroke Patients Only)       Balance Overall balance assessment: Needs assistance Sitting-balance support: Single extremity supported Sitting balance-Leahy Scale: Fair Sitting balance - Comments: fair/good with 1 arm support, fair without BUE support   Standing balance support: During functional activity;No upper extremity supported Standing balance-Leahy Scale: Poor Standing balance comment: Max hand held assist                            Cognition Arousal/Alertness: Awake/alert Behavior During Therapy: WFL for tasks assessed/performed Overall Cognitive Status: Within Functional Limits for tasks assessed                                        Exercises General Exercises - [place]  Quad: Seated;AROM;Strengthening;Right;10 reps Hip Flexion/Marching: Seated;AROM;Strengthening;Right;10 reps Toe Raises: Seated;AROM;Strengthening;Right;15 reps Heel Raises: Seated;AROM;Strengthening;Right;15 reps    General Comments        Pertinent Vitals/Pain Pain Assessment: No/denies pain    Home Living                      Prior Function            PT Goals (current goals can now be found in the care plan section) Acute Rehab PT Goals Patient Stated Goal:  return home with family to assist PT Goal Formulation: With patient/family Time For Goal Achievement: 01/29/20 Potential to Achieve Goals: Good Progress towards PT goals: Progressing toward goals    Frequency    Min 3X/week      PT Plan Current plan remains appropriate    Co-evaluation              AM-PAC PT "6 Clicks" Mobility   Outcome Measure  Help needed turning from your back to your side while in a flat bed without using bedrails?: A Lot Help needed moving from lying on your back to sitting on the side of a flat bed without using bedrails?: A Lot Help needed moving to and from a bed to a chair (including a wheelchair)?: A Lot Help needed standing up from a chair using your arms (e.g., wheelchair or bedside chair)?: A Lot Help needed to walk in hospital room?: Total Help needed climbing 3-5 steps with a railing? : Total 6 Click Score: 10    End of Session Equipment Utilized During Treatment: Oxygen Activity Tolerance: Patient tolerated treatment well;Patient limited by fatigue Patient left: in chair;with call [place]/phone within reach;with family/visitor present Nurse Communication: Mobility status PT Visit Diagnosis: Unsteadiness on feet (R26.81);Muscle weakness (generalized) (M62.81);Hemiplegia and hemiparesis Hemiplegia - Right/Left: Left Hemiplegia - dominant/non-dominant: Non-dominant Hemiplegia - caused by: Cerebral infarction     Time: 1140-1158 PT Time Calculation (min) (ACUTE ONLY): 18 min  Charges:  $Therapeutic Exercise: 8-22 mins $Therapeutic Activity: 8-22 mins                     12:16 PM, 01/24/20 Lonell Grandchild, MPT Physical Therapist with [facility] [phone] office [phone] mobile phone

## 2020-01-24 NOTE — Care Management Important Message (Signed)
Important Message  Patient Details  Name: Brandy [name] MRN: [id number] Date of Birth: 10/15/1940   Medicare Important Message Given:  Yes Shelda Pal will deliver letter to patient room)     Tommy Medal 01/24/2020, 1:46 PM

## 2020-01-24 NOTE — TOC Transition Note (Signed)
Transition of Care [facility]) - CM/SW Discharge Note   Patient Details  Name: Brandy [name] MRN: [id number] Date of Birth: [birth year]  Transition of Care [facility]) CM/SW Contact:  Boneta Lucks, RN Phone Number: 01/24/2020, 11:54 AM   Clinical Narrative:   Patient discharging home today. Orders for HHPT are in. Tim with [facility] updated with discharge.    Final next level of care: [place] Barriers to Discharge: Barriers Resolved   Patient Goals and CMS Choice Patient states their goals for this hospitalization and ongoing recovery are:: to return home. CMS Medicare.gov Compare Post Acute Care list provided to:: Patient Represenative (must comment) Choice offered to / list presented to : Spouse  Discharge Placement                Patient to be transferred to facility by: Family   Patient and family notified of of transfer: 01/24/20  Discharge Plan and Services    HH Arranged: PT [facility] Agency: [facility]now [facility]) Date [place]: 01/22/20 Time [place]: 1345 Representative spoke with at [place]: [place]  Readmission Risk Interventions Readmission Risk Prevention Plan 10/15/2018  Transportation Screening Complete  PCP or Specialist Appt within 3-5 Days Complete  HRI or [place] Complete  Social Work Consult for [place] Planning/Counseling Complete  Palliative Care Screening Not Applicable  Medication Review [employer] photographer) Complete  Some recent data might be hidden

## 2020-01-24 NOTE — Progress Notes (Addendum)
Subjective:  No BM since colonoscopy. Denies abdominal pain. No n/v. Appetite chronically poor.   Objective: Vital signs in last 24 hours: Temp:  [97.5 F (36.4 C)-98.9 F (37.2 C)] 98.1 F (36.7 C) (10/28 0912) Pulse Rate:  [72-87] 83 (10/28 0912) Resp:  [14-20] 16 (10/28 0912) BP: (77-129)/(45-96) 124/72 (10/28 0912) SpO2:  [91 %-100 %] 95 % (10/28 0912) Last BM Date: 01/23/20 General:   Alert,  Well-developed, well-nourished, pleasant and cooperative in NAD Head:  Normocephalic and atraumatic. Eyes:  Sclera clear, no icterus.  Abdomen:  Soft, nontender and nondistended.  Peg in upper abd. Normal bowel sounds, without guarding, and without rebound.   Neurologic:  Alert and  oriented x4;  grossly normal neurologically. Psych:  Alert and cooperative. Normal mood and affect.  Intake/Output from previous day: 10/27 0701 - 10/28 0700 In: 791 [P.O.:60; I.V.:731] Out: -  Intake/Output this shift: No intake/output data recorded.  Lab Results: CBC Recent Labs    01/22/20 0602 01/23/20 0330 01/24/20 0607  WBC 6.5 4.9 5.8  HGB 10.7* 11.0* 10.4*  HCT 35.0* 36.4 33.1*  MCV 96.7 98.9 94.8  PLT 173 185 203   BMET Recent Labs    01/21/20 1903 01/22/20 0602 01/23/20 0330  NA 136 135 137  K 5.0 4.7 4.6  CL 101 104 103  CO2 27 26 25   GLUCOSE 97 79 72  BUN 29* 27* 27*  CREATININE 1.32* 1.36* 1.40*  CALCIUM 9.3 9.0 9.2   LFTs Recent Labs    01/21/20 1903 01/22/20 0602  BILITOT 0.7 0.8  ALKPHOS 58 58  AST 23 22  ALT 18 16  PROT 6.6 6.4*  ALBUMIN 3.4* 3.2*   No results for input(s): LIPASE in the last 72 hours. PT/INR Recent Labs    01/21/20 1903 01/22/20 0602  LABPROT 17.3* 16.6*  INR 1.5* 1.4*      Imaging Studies: DG Chest 1 View  Result Date: 01/21/2020 CLINICAL DATA:  Increased short of breath rectal bleeding EXAM: CHEST  1 VIEW COMPARISON:  10/10/2018 FINDINGS: Surgical changes in the cervical spine. Small left greater than right pleural effusions.  Left basilar airspace disease. Cardiomegaly with vascular congestion. Aortic atherosclerosis. No pneumothorax. IMPRESSION: Small left greater than right pleural effusions with left basilar atelectasis or pneumonia. Cardiomegaly with vascular congestion. Electronically Signed   By: Donavan Foil M.D.   On: 01/21/2020 19:00  [2 weeks]   Assessment: 79 year old female with multiple comorbidities including anemia of chronic disease, stroke with left hemiparesis, COPD on 2.5 L supplemental oxygen chronically, chronic kidney disease, systolic and diastolic CHF, PEG tube placed July 2020 due to failure to thrive, on Eliquis for A. fib presenting with rectal bleeding for several days.  Patient also started on antibiotics as an outpatient for pneumonia.  Chest x-ray on presentation with small left greater than right pleural effusion with left bibasilar atelectasis or pneumonia.    Rectal bleeding: Acute onset painless rectal bleeding for 4 to 5 days prior to presenting to the ED.  Stool was dark with maroon tent on exam.  Hemoglobin in February was 10.8.  When she presented to the ED her hemoglobin was 11.2, down to 10.4 today.  Likely in part due to dilution.  She has not required any blood products.  She underwent an EGD and colonoscopy yesterday which showed erosive gastropathy, intact gastrostomy with patent G-tube present.  On colonoscopy she had diverticulosis, 2 polyps removed measuring between 10 to 12 mm each, MR conditional clips placed at  both sites to prevent bleeding.  Dr. Laural Golden suspected diverticular bleed.  Plan: 1. Resume Eliquis at prior dose October 30. 2. Follow-up pending path. 3. Follow-up H. pylori serologies. 4. From a GI standpoint she is stable.  We will sign off.  Call with any questions.  Laureen Ochs. Bernarda Caffey [facility] [phone] 10/28/202111:24 AM    LOS: 3 days

## 2020-01-24 NOTE — Plan of Care (Signed)

## 2020-01-25 ENCOUNTER — Encounter (HOSPITAL_COMMUNITY): Payer: Self-pay | Admitting: Internal Medicine

## 2020-01-25 LAB — H. PYLORI ANTIBODY, IGG: H Pylori IgG: 0.42 Index Value (ref 0.00–0.79)

## 2020-01-25 LAB — SURGICAL PATHOLOGY

## 2020-01-28 DIAGNOSIS — Z993 Dependence on wheelchair: Secondary | ICD-10-CM | POA: Diagnosis not present

## 2020-01-29 ENCOUNTER — Emergency Department (HOSPITAL_COMMUNITY)
Admission: EM | Admit: 2020-01-29 | Discharge: 2020-01-30 | Disposition: A | Discharge status: Home / Self Care | Payer: Medicare PPO | Attending: Emergency Medicine | Admitting: Emergency Medicine

## 2020-01-29 ENCOUNTER — Other Ambulatory Visit: Payer: Self-pay

## 2020-01-29 ENCOUNTER — Encounter (HOSPITAL_COMMUNITY): Payer: Self-pay

## 2020-01-29 ENCOUNTER — Emergency Department (HOSPITAL_COMMUNITY): Payer: Medicare PPO

## 2020-01-29 DIAGNOSIS — Z79899 Other long term (current) drug therapy: Secondary | ICD-10-CM | POA: Diagnosis not present

## 2020-01-29 DIAGNOSIS — I2510 Atherosclerotic heart disease of native coronary artery without angina pectoris: Secondary | ICD-10-CM | POA: Insufficient documentation

## 2020-01-29 DIAGNOSIS — Z7901 Long term (current) use of anticoagulants: Secondary | ICD-10-CM | POA: Insufficient documentation

## 2020-01-29 DIAGNOSIS — Z87891 Personal history of nicotine dependence: Secondary | ICD-10-CM | POA: Insufficient documentation

## 2020-01-29 DIAGNOSIS — I130 Hypertensive heart and chronic kidney disease with heart failure and stage 1 through stage 4 chronic kidney disease, or unspecified chronic kidney disease: Secondary | ICD-10-CM | POA: Insufficient documentation

## 2020-01-29 DIAGNOSIS — Z8673 Personal history of transient ischemic attack (TIA), and cerebral infarction without residual deficits: Secondary | ICD-10-CM | POA: Diagnosis not present

## 2020-01-29 DIAGNOSIS — J90 Pleural effusion, not elsewhere classified: Secondary | ICD-10-CM | POA: Diagnosis not present

## 2020-01-29 DIAGNOSIS — I517 Cardiomegaly: Secondary | ICD-10-CM | POA: Diagnosis not present

## 2020-01-29 DIAGNOSIS — I5042 Chronic combined systolic (congestive) and diastolic (congestive) heart failure: Secondary | ICD-10-CM | POA: Diagnosis not present

## 2020-01-29 DIAGNOSIS — R0602 Shortness of breath: Secondary | ICD-10-CM | POA: Diagnosis not present

## 2020-01-29 DIAGNOSIS — N183 Chronic kidney disease, stage 3 unspecified: Secondary | ICD-10-CM | POA: Insufficient documentation

## 2020-01-29 DIAGNOSIS — J9811 Atelectasis: Secondary | ICD-10-CM | POA: Diagnosis not present

## 2020-01-29 LAB — CBC WITH DIFFERENTIAL/PLATELET
Abs Immature Granulocytes: 0.02 10*3/uL (ref 0.00–0.07)
Basophils Absolute: 0.1 10*3/uL (ref 0.0–0.1)
Basophils Relative: 1 %
Eosinophils Absolute: 0.1 10*3/uL (ref 0.0–0.5)
Eosinophils Relative: 2 %
HCT: 35.5 % — ABNORMAL LOW (ref 36.0–46.0)
Hemoglobin: 10.7 g/dL — ABNORMAL LOW (ref 12.0–15.0)
Immature Granulocytes: 0 %
Lymphocytes Relative: 17 %
Lymphs Abs: 1 10*3/uL (ref 0.7–4.0)
MCH: 29 pg (ref 26.0–34.0)
MCHC: 30.1 g/dL (ref 30.0–36.0)
MCV: 96.2 fL (ref 80.0–100.0)
Monocytes Absolute: 0.5 10*3/uL (ref 0.1–1.0)
Monocytes Relative: 8 %
Neutro Abs: 4.3 10*3/uL (ref 1.7–7.7)
Neutrophils Relative %: 72 %
Platelets: 246 10*3/uL (ref 150–400)
RBC: 3.69 MIL/uL — ABNORMAL LOW (ref 3.87–5.11)
RDW: 13.2 % (ref 11.5–15.5)
WBC: 6 10*3/uL (ref 4.0–10.5)
nRBC: 0 % (ref 0.0–0.2)

## 2020-01-29 LAB — COMPREHENSIVE METABOLIC PANEL
ALT: 15 U/L (ref 0–44)
AST: 22 U/L (ref 15–41)
Albumin: 3 g/dL — ABNORMAL LOW (ref 3.5–5.0)
Alkaline Phosphatase: 54 U/L (ref 38–126)
Anion gap: 12 (ref 5–15)
BUN: 35 mg/dL — ABNORMAL HIGH (ref 8–23)
CO2: 25 mmol/L (ref 22–32)
Calcium: 9.6 mg/dL (ref 8.9–10.3)
Chloride: 102 mmol/L (ref 98–111)
Creatinine, Ser: 1.57 mg/dL — ABNORMAL HIGH (ref 0.44–1.00)
GFR, Estimated: 34 mL/min — ABNORMAL LOW (ref >60)
Glucose, Bld: 121 mg/dL — ABNORMAL HIGH (ref 70–99)
Potassium: 4.6 mmol/L (ref 3.5–5.1)
Sodium: 139 mmol/L (ref 135–145)
Total Bilirubin: 0.5 mg/dL (ref 0.3–1.2)
Total Protein: 6.7 g/dL (ref 6.5–8.1)

## 2020-01-29 LAB — TROPONIN I (HIGH SENSITIVITY)
Troponin I (High Sensitivity): 197 ng/L (ref <18)
Troponin I (High Sensitivity): 207 ng/L (ref <18)

## 2020-01-29 LAB — BRAIN NATRIURETIC PEPTIDE: B Natriuretic Peptide: 2179.2 pg/mL — ABNORMAL HIGH (ref 0.0–100.0)

## 2020-01-29 MED ORDER — FUROSEMIDE 20 MG PO TABS: 40.0000 mg | ORAL_TABLET | Freq: Every day | ORAL | 0 refills | Status: DC

## 2020-01-29 MED ORDER — FUROSEMIDE 10 MG/ML IJ SOLN
40.0000 mg | Freq: Once | INTRAMUSCULAR | Status: AC
  Administered 2020-01-29: 40 mg via INTRAVENOUS
  Filled 2020-01-29: qty 4

## 2020-01-29 NOTE — ED Provider Notes (Signed)
[facility] EMERGENCY DEPARTMENT Provider Note   CSN: [id number] Arrival date & time: 01/29/20  1126     History Chief Complaint  Patient presents with  . Shortness of Breath    Brandy [name] is a 79 y.o. female.  HPI 79 year old female with a history of CHF with an EF of 25%, hypertension, paroxysmal A. fib on Eliquis, TIA, recently admitted to the hospital for a GI bleed on Eliquis presents to the ER with complaints of shortness of breath.  Patient states that this morning she woke up and felt short of breath at rest while watching TV.  She denies any fevers, chills, cough, lower extremity swelling.  She has just recently finished her course of antibiotics for pneumonia which she was diagnosed with as well while she was in the hospital.  She presented to her PCPs office and was told to come to the ER for further evaluation.  Patient requires 2-2.5L of home O2 per her husband at bedside, no history of COPD but has required supplemental O2 since her stroke.  At triage, nursing staff noted that her oxygen tank was not blowing any oxygen.  Patient has chronic home O2, normally requiring 2.5L of [place].  Nursing staff notes that the oxygen tank and only have supply of oxygen left in it, and no O2 sat was noted even at 5 L.  Patient also reported that she cannot feel anything coming out of the O2 tank.  Without oxygen, she was at 88% on room air, however with 2 L nasal cannula sats went up to 100%.  She denies any chest pain, dizziness, nausea, vomiting abdominal pain, syncope, blood in her stool.  She states that now that she is on proper oxygen, she no longer has shortness of breath.  She is ambulatory by wheelchair at home normally.    Past Medical History:  Diagnosis Date  . Cardiomyopathy    a. EF of 25% in 07/2006 b. EF normalized by repeat echo in 2011 c. EF 35-40% by echo in 05/2017 with cath showing mild nonobstructive CAD  . Chronic combined systolic and diastolic CHF  (congestive heart failure) ([place])   . Colon polyps   . Diverticulosis of colon   . Hypertension   . Hypertensive heart disease   . Osteopenia   . Paroxysmal atrial fibrillation ([place]) 10/10/2018  . Stroke ([place])   . TIA (transient ischemic attack)   . Tobacco abuse    50 pack year    Patient Active Problem List   Diagnosis Date Noted  . Acute GI bleeding 01/21/2020  . Atrial fibrillation, chronic ([place]) 01/21/2020  . Hyperlipidemia 01/21/2020  . PEG tube malfunction ([place])   . Protein-calorie malnutrition, severe 10/12/2018  . Failure to thrive in adult 10/11/2018  . Acute renal failure superimposed on stage 3 chronic kidney disease ([place]) 10/11/2018  . Palliative care by specialist   . Acute prerenal azotemia 10/10/2018  . Hypernatremia 10/10/2018  . AKI (acute kidney injury) ([place]) 10/10/2018  . Paroxysmal atrial fibrillation ([place]) 10/10/2018  . Abnormality of gait 05/04/2018  . Acute exacerbation of congestive heart failure ([place]) 03/28/2018  . Left hemiplegia ([place])   . Chronic kidney disease   . Hypokalemia   . Acute on chronic anemia   . Hypoalbuminemia due to protein-calorie malnutrition ([place])   . Hemiparesis affecting left side as late effect of stroke ([place]) 02/16/2018  . Left-sided neglect 02/16/2018  . Right middle cerebral artery stroke ([place]) 02/16/2018  . Acute  embolic stroke ([place])   . Acute right MCA stroke ([place])   . History of CVA with residual deficit   . Benign essential HTN   . Chronic combined systolic and diastolic congestive heart failure ([place])   . History of fusion of cervical spine   . CKD (chronic kidney disease) stage 3, GFR 30-59 ml/min (HCC)   . Acute blood loss anemia   . Stroke ([place]) 02/10/2018  . Middle cerebral artery embolism, right 02/10/2018  . Goals of care, counseling/discussion 11/01/2017  . Non-ischemic cardiomyopathy ([place]) 09/14/2017  . Precordial chest pain 09/14/2017  . B12 deficiency 09/13/2017  . Tachycardia 09/13/2017  . Acute  ischemic stroke ([place])   . Seasonal allergies 07/13/2017  . TIA (transient ischemic attack) 07/12/2017  . Anemia 07/12/2017  . Pneumonia 06/02/2017  . Pleural effusion 06/02/2017  . Acute on chronic systolic congestive heart failure ([place])   . Elevated troponin   . Other fatigue 05/04/2017  . Dyspnea 05/04/2017  . Pseudophakia of both eyes 08/06/2016  . Chronic combined systolic and diastolic CHF (congestive heart failure) ([place]) 07/09/2016  . Hypertensive heart disease   . Hypertension   . Nuclear sclerosis, bilateral 05/24/2016  . HTN (hypertension) 01/22/2013  . TOBACCO ABUSE 02/18/2008  . CARDIOMYOPATHY 02/18/2008  . Diverticulosis of colon 02/18/2008  . OSTEOPENIA 02/18/2008  . Disorder of bone and cartilage 02/18/2008    Past Surgical History:  Procedure Laterality Date  . BREAST BIOPSY    . CARPAL TUNNEL RELEASE     right hand  . CERVICAL FUSION    . CESAREAN SECTION     2 times  . COLONOSCOPY  9935   pt uncertain as to whether polypectomy required or performed  . COLONOSCOPY WITH PROPOFOL N/A 01/23/2020   Procedure: COLONOSCOPY WITH PROPOFOL;  Surgeon: Rogene Houston, MD;  [facility];  Service: Endoscopy;  Laterality: N/A;  . ECTOPIC PREGNANCY SURGERY    . ESOPHAGOGASTRODUODENOSCOPY (EGD) WITH PROPOFOL N/A 10/13/2018   Procedure: ESOPHAGOGASTRODUODENOSCOPY (EGD) WITH PROPOFOL;  Surgeon: Aviva Signs, MD;  Location: [facility] ORS;  Service: General;  Laterality: N/A;  . ESOPHAGOGASTRODUODENOSCOPY (EGD) WITH PROPOFOL N/A 01/23/2020   Procedure: ESOPHAGOGASTRODUODENOSCOPY (EGD) WITH PROPOFOL;  Surgeon: Rogene Houston, MD;  Location[facility];  Service: Endoscopy;  Laterality: N/A;  . IR ANGIO VERTEBRAL SEL SUBCLAVIAN INNOMINATE UNI R MOD SED  02/10/2018  . IR CT HEAD LTD  02/10/2018  . IR PERCUTANEOUS ART THROMBECTOMY/INFUSION INTRACRANIAL INC DIAG ANGIO  02/10/2018  . PEG PLACEMENT N/A 10/13/2018   Procedure: PERCUTANEOUS ENDOSCOPIC GASTROSTOMY (PEG)  PLACEMENT;  Surgeon: Aviva Signs, MD;  Location: [facility];  Service: General;  Laterality: N/A;  . PEG PLACEMENT N/A 12/04/2019   Procedure: PERCUTANEOUS ENDOSCOPIC GASTROSTOMY (PEG) REPLACEMENT;  Surgeon: Aviva Signs, MD;  Location[facility];  Service: Gastroenterology;  Laterality: N/A;  . PEG PLACEMENT N/A 12/12/2019   Procedure: PERCUTANEOUS ENDOSCOPIC GASTROSTOMY (PEG) REPLACEMENT;  Surgeon: Aviva Signs, MD;  [facility];  Service: Gastroenterology;  Laterality: N/A;  . POLYPECTOMY  01/23/2020   Procedure: POLYPECTOMY;  Surgeon: Rogene Houston, MD;  Location[facility];  Service: Endoscopy;;  . RADIOLOGY WITH ANESTHESIA N/A 02/10/2018   Procedure: RADIOLOGY WITH ANESTHESIA;  Surgeon: Luanne Bras, MD;  Location: [facility];  Service: Radiology;  Laterality: N/A;  . RIGHT/LEFT HEART CATH AND CORONARY ANGIOGRAPHY N/A 06/03/2017   Procedure: RIGHT/LEFT HEART CATH AND CORONARY ANGIOGRAPHY;  Surgeon: Jettie Booze, MD;  Location: [facility] LAB;  Service: Cardiovascular;  Laterality: N/A;  . TEE WITHOUT CARDIOVERSION N/A 02/13/2018   Procedure: TRANSESOPHAGEAL ECHOCARDIOGRAM (TEE);  Surgeon: Sueanne Margarita, MD;  Location: [facility] ENDOSCOPY;  Service: Cardiovascular;  Laterality: N/A;  . TONSILLECTOMY       OB History   No obstetric history on file.     Family History  Problem Relation Age of Onset  . Hypertension Brother   . Hypertension Maternal Grandmother   . Cancer Paternal Grandmother   . Colon cancer Neg Hx     Social History   Tobacco Use  . Smoking status: Former Smoker    Packs/day: 1.00    Years: 50.00    Pack years: 50.00    Types: Cigarettes    Start date: 09/02/1960    Quit date: 03/29/2008    Years since quitting: 11.8  . Smokeless tobacco: Never Used  Vaping Use  . Vaping Use: Never used  Substance Use Topics  . Alcohol use: No    Alcohol/week: 0.0 standard drinks  . Drug use: Never    Home Medications Prior to Admission  medications   Medication Sig Start Date End Date Taking? Authorizing Provider  acetaminophen (TYLENOL) 325 MG tablet Take 2 tablets (650 mg total) by mouth every 4 (four) hours as needed for mild pain (or temp > 37.5 C (99.5 F)). Patient taking differently: Take 650 mg by mouth every 6 (six) hours as needed for mild pain or moderate pain.  03/16/18   Angiulli, Lavon Paganini, PA-C  atorvastatin (LIPITOR) 10 MG tablet TAKE 1 TABLET BY MOUTH EVERY DAY AT 6 PM 11/27/19   Strader, [place] M, PA-C  Budeson-Glycopyrrol-Formoterol (BREZTRI AEROSPHERE) 160-9-4.8 MCG/ACT AERO Inhale 2 Inhalers into the lungs in the morning and at bedtime.    [provider]  carvedilol (COREG) 3.125 MG tablet Take 3.125 mg by mouth 2 (two) times daily with a meal.    [provider]  cetirizine (ZYRTEC) 10 MG tablet Take 10 mg by mouth daily.    [provider]  citalopram (CELEXA) 10 MG tablet Take 10 mg by mouth daily.    [provider]  donepezil (ARICEPT) 5 MG tablet Take 1 tablet by mouth at bedtime. 11/15/17   [provider]  ELIQUIS 2.5 MG TABS tablet Take 1 tablet (2.5 mg total) by mouth 2 (two) times daily. 01/26/20   Johnson, Clanford L, MD  ferrous sulfate 324 MG TBEC Take 324 mg by mouth. M/W/F    [provider]  furosemide (LASIX) 20 MG tablet Take 2 tablets (40 mg total) by mouth daily for 3 days. 01/29/20 02/01/20  Garald Balding, PA-C  isosorbide mononitrate (IMDUR) 30 MG 24 hr tablet TAKE 1/2 TABLET(15 MG) BY MOUTH DAILY 11/12/19   Imogene Burn, PA-C  Melatonin 5 MG TABS Take by mouth.    [provider]  Multiple Vitamin (MULTIVITAMIN) LIQD Take 15 mLs by mouth daily. 10/15/18   Orson Eva, MD  Nutritional Supplements (FEEDING SUPPLEMENT, OSMOLITE 1.5 CAL,) LIQD Place 120 mLs into feeding tube 5 (five) times daily. 10/14/18   Orson Eva, MD  pantoprazole (PROTONIX) 40 MG tablet Take 1 tablet (40 mg total) by mouth daily before breakfast. 01/25/20    Murlean Iba, MD    Allergies    Lisinopril  Review of Systems   Review of Systems  Physical Exam Updated Vital Signs BP 118/87   Pulse (!) 112   Temp 98 F (36.7 C) (Oral)   Resp (!) 25  SpO2 100%   Physical Exam Vitals and nursing note reviewed.  Constitutional:      General: She is not in acute distress.    Appearance: She is well-developed. She is not ill-appearing, toxic-appearing or diaphoretic.  HENT:     Head: Normocephalic and atraumatic.  Eyes:     Conjunctiva/sclera: Conjunctivae normal.  Cardiovascular:     Rate and Rhythm: Normal rate and regular rhythm.     Heart sounds: No murmur heard.   Pulmonary:     Effort: Pulmonary effort is normal. No respiratory distress.     Breath sounds: Normal breath sounds. No decreased breath sounds, wheezing, rhonchi or rales.     Comments: Respirations equal and unlabored, chest rise equal.  Speaking full sentences without increased work of breathing.  Lung sounds clear, no rales rhonchi or wheezes. Chest:     Chest wall: No tenderness.  Abdominal:     Palpations: Abdomen is soft.     Tenderness: There is no abdominal tenderness.  Musculoskeletal:     Cervical back: Neck supple.     Right lower leg: No tenderness. No edema.     Left lower leg: No tenderness. No edema.  Skin:    General: Skin is warm and dry.     Findings: No erythema.     Nails: There is no clubbing.  Neurological:     General: No focal deficit present.     Mental Status: She is alert and oriented to person, place, and time.     Motor: No weakness.  Psychiatric:        Mood and Affect: Mood normal.        Behavior: Behavior normal.     ED Results / Procedures / Treatments   Labs (all labs ordered are listed, but only abnormal results are displayed) Labs Reviewed  CBC WITH DIFFERENTIAL/PLATELET - Abnormal; Notable for the following components:      Result Value   RBC 3.69 (*)    Hemoglobin 10.7 (*)    HCT 35.5 (*)    All other  components within normal limits  COMPREHENSIVE METABOLIC PANEL - Abnormal; Notable for the following components:   Glucose, Bld 121 (*)    BUN 35 (*)    Creatinine, Ser 1.57 (*)    Albumin 3.0 (*)    GFR, Estimated 34 (*)    All other components within normal limits  BRAIN NATRIURETIC PEPTIDE - Abnormal; Notable for the following components:   B Natriuretic Peptide 2,179.2 (*)    All other components within normal limits  TROPONIN I (HIGH SENSITIVITY) - Abnormal; Notable for the following components:   Troponin I (High Sensitivity) 197 (*)    All other components within normal limits  TROPONIN I (HIGH SENSITIVITY) - Abnormal; Notable for the following components:   Troponin I (High Sensitivity) 207 (*)    All other components within normal limits  URINALYSIS, ROUTINE W REFLEX MICROSCOPIC    EKG EKG Interpretation  Date/Time:  Tuesday January 29 2020 11:41:50 EDT Ventricular Rate:  88 PR Interval:  182 QRS Duration: 98 QT Interval:  346 QTC Calculation: 418 R Axis:   -83 Text Interpretation: Normal sinus rhythm Low voltage QRS Left anterior fascicular block Cannot rule out Anteroseptal infarct , age undetermined No significant change since last tracing Confirmed by Blanchie Dessert [phone]) on 01/29/2020 3:40:10 PM   Radiology DG Chest 2 View  Result Date: 01/29/2020 CLINICAL DATA:  Shortness of breath. EXAM: CHEST - 2 VIEW COMPARISON:  Prior chest radiograph 01/21/2020. FINDINGS: Unchanged cardiomegaly. Small to moderate left and small right pleural effusions. Associated left greater than right bibasilar atelectasis and/or consolidation. No evidence of pneumothorax. No acute bony abnormality identified. ACDF hardware. IMPRESSION: Small to moderate left and small right pleural effusions. Associated left greater than right bibasilar atelectasis and or consolidation. Unchanged cardiomegaly. Electronically Signed   By: Kellie Simmering DO   On: 01/29/2020 12:25     Procedures Procedures (including critical care time)  Medications Ordered in ED Medications  furosemide (LASIX) injection 40 mg (40 mg Intravenous Given 01/29/20 2209)    ED Course  I have reviewed the triage vital signs and the nursing notes.  Pertinent labs & imaging results that were available during my care of the patient were reviewed by me and considered in my medical decision making (see chart for details).    MDM Rules/Calculators/A&P                          79 year old female with complaints of shortness of breath. On presentation, she is alert, oriented, nontoxic-appearing, no acute distress, resting comfortably in the ER bed.  She is speaking full sentences without evidence of respiratory distress.  Her vitals overall very reassuring.  She is currently on 2 L [place] in the room and her oxygen saturations are at 98%.  Lung sounds are clear with no evidence of rales, wheezes, or rhonchi.  She has no lower extremity edema on exam.   DDx includes pneumonia, anemia, CHF, ACS, equipment malfunction  Labs reviewed and interpreted by me -CBC with a hemoglobin of 10.7, appears to be stable.  Patient is denying any blood in her stool.  No leukocytosis -CMP without any electrolyte abnormalities, creatinine 1.57 which appears to be lightly elevated but not significantly from baseline.  Glucose of 121.     Chest x-ray reviewed and interpreted by me -She has some small pleural effusions bilaterally, with associated left greater than right bibasilar atelectasis and/or consolidation.  EKG without significant changes from prior  MDM: Initial troponin 197, critical level, however pt appears to have elevated troponins in this similar range prior. Second troponin largely unchanged, no CP, no EKG changes, doubt ischemia [place] [address].2 which could also explain why her troponin is high. Per discussion with Dr. Maryan Rued, pt was given 40mg  of Lasix and given 3 days worth of Lasix  to take home with instructions to followup with her cardiologist. Transition of care assisted with replacing tank, has working oxygen now and continues to deny SOB with adequate oxygen saturations. Stable for discharge with return precautions. Pt voiced understanding and is agreeable.   Pt was seen and evaluated by Dr. Maryan Rued who is agreeable to the above plan and disposition.    Final Clinical Impression(s) / ED Diagnoses Final diagnoses:  SOB (shortness of breath)    Rx / DC Orders ED Discharge Orders         Ordered    furosemide (LASIX) 20 MG tablet  Daily        01/29/20 1932           Lyndel Safe 01/29/20 2345    Blanchie Dessert, MD 01/30/20 1515

## 2020-01-29 NOTE — Progress Notes (Signed)
Responded to consult for IV. Noted plans for discharge to home. IV consult cleared.

## 2020-01-29 NOTE — ED Triage Notes (Signed)
Pt presents to ED d/t SOB, pt was at her PCP today having difficulty breathing, they sent her here for further evaluation. Pt recently admitted and dx with PNA. Pt usually wears 2.5 L Oxygen [place] at home as needed, per spouse its been more frequent. pts O2 tank has about 1/2 supply of oxygen left in it, no O2 was coming out until I cranked it all the way up. I c ould not feel any oxygen on 5 L. Pt reports she couldn't feel anything coming out either and was SOB. Without O2 sats 88%, applied pt to 2L [place] sats 100%. Pt in NAD

## 2020-01-29 NOTE — Care Management (Signed)
ED CM consulted concerning home oxygen issue, CM met with patient and spouse at bedside who reports that the portable oxygen tank is not working, confirms that the home concentrator is working but thinks its too hot Spouse brought tank to room,  CM checked tank is on full, CM open the oxygen tank valve oxygen flowing, CM instructed patient and spouse on how to open and turn tank on. Verbalized understanding teach back done.  CM contacted [employer] to go out to follow up and make sure patient and spouse are managing oxygen well at home. Updated EDP who is agreeable with discharge plan. Patient will be discharged home with her port oxygen spouse transported home via personal vehicle.

## 2020-01-29 NOTE — Discharge Instructions (Addendum)
Your work-up today was overall reassuring.  Please take the Lasix as directed make sure to follow-up with your cardiologist or primary care doctor about the symptoms you are experiencing today.  Return to the ER for any new or worsening symptoms.

## 2020-01-29 NOTE — ED Notes (Signed)
Multiple teams made for IV attempts, awaiting IV team. Pt and spouse made aware.

## 2020-02-01 ENCOUNTER — Other Ambulatory Visit: Payer: Self-pay

## 2020-02-01 ENCOUNTER — Ambulatory Visit: Payer: Medicare PPO | Admitting: Cardiovascular Disease

## 2020-02-01 ENCOUNTER — Ambulatory Visit (INDEPENDENT_AMBULATORY_CARE_PROVIDER_SITE_OTHER): Payer: Medicare PPO | Admitting: Cardiovascular Disease

## 2020-02-01 ENCOUNTER — Encounter: Payer: Self-pay | Admitting: Cardiovascular Disease

## 2020-02-01 VITALS — BP 126/90 | HR 87 | Ht 64.0 in

## 2020-02-01 DIAGNOSIS — I5042 Chronic combined systolic (congestive) and diastolic (congestive) heart failure: Secondary | ICD-10-CM | POA: Diagnosis not present

## 2020-02-01 DIAGNOSIS — Z5181 Encounter for therapeutic drug level monitoring: Secondary | ICD-10-CM

## 2020-02-01 DIAGNOSIS — Z8719 Personal history of other diseases of the digestive system: Secondary | ICD-10-CM | POA: Diagnosis not present

## 2020-02-01 DIAGNOSIS — I63511 Cerebral infarction due to unspecified occlusion or stenosis of right middle cerebral artery: Secondary | ICD-10-CM

## 2020-02-01 DIAGNOSIS — I639 Cerebral infarction, unspecified: Secondary | ICD-10-CM

## 2020-02-01 DIAGNOSIS — I482 Chronic atrial fibrillation, unspecified: Secondary | ICD-10-CM | POA: Diagnosis not present

## 2020-02-01 DIAGNOSIS — I10 Essential (primary) hypertension: Secondary | ICD-10-CM | POA: Diagnosis not present

## 2020-02-01 MED ORDER — FUROSEMIDE 20 MG PO TABS
20.0000 mg | ORAL_TABLET | Freq: Every day | ORAL | 5 refills | Status: DC
Start: 2020-02-01 — End: 2021-01-06

## 2020-02-01 NOTE — Patient Instructions (Addendum)
Medication Instructions:  START FUROSEMIDE TO 20 MG DAILY   *If you need a refill on your cardiac medications before your next appointment, please call your pharmacy*  Lab Work: BMET/CBC TODAY   If you have labs (blood work) drawn today and your tests are completely normal, you will receive your results only by: Marland Kitchen MyChart Message (if you have MyChart) OR . A paper copy in the mail If you have any lab test that is abnormal or we need to change your treatment, we will call you to review the results.  Testing/Procedures: NONE  Follow-Up: At [facility], you and your health needs are our priority.  As part of our continuing mission to provide you with exceptional heart care, we have created designated Provider Care Teams.  These Care Teams include your primary Cardiologist (physician) and Advanced Practice Providers (APPs -  Physician Assistants and Nurse Practitioners) who all work together to provide you with the care you need, when you need it.  We recommend signing up for the patient portal called "MyChart".  Sign up information is provided on this After Visit Summary.  MyChart is used to connect with patients for Virtual Visits (Telemedicine).  Patients are able to view lab/test results, encounter notes, upcoming appointments, etc.  Non-urgent messages can be sent to your provider as well.   To learn more about what you can do with MyChart, go to [email or web address].    Your next appointment:   3 month(s)  The format for your next appointment:   In Person  Provider:   You may see Skeet Latch, MD or one of the following Advanced Practice Providers on your designated Care Team:    Kerin Ransom, PA-C  [place], [place]  Coletta Memos, [place]

## 2020-02-01 NOTE — Progress Notes (Signed)
Cardiology Office Note   Date:  02/01/2020   ID:  Brandy Brandy [name] Brandy [name], Brandy Brandy [name] Brandy [name] Jan 23, 1941, MRN [id number]  PCP:  [name], MD  Cardiologist:   Skeet Latch, MD   Chief Complaint  Brandy [name] presents with  . New Brandy [name] (Initial Visit)    6 months.     History of Present Illness: Brandy Brandy [name] Brandy [name] is a 79 y.o. female with chronic systolic and diastolic heart failure, hypertension, hyperlipidemia, nonobstructive CAD, paroxysmal atrial fibrillation, prior stroke with left hemiparesis, and COPD on home O2 here for follow-up.  She was previously a Brandy [name] of Dr. Bronson Ing.  She was hospitalized 09/2018 with hypernatremia.  Her sodium was 151.  She was nonambulatory and nonverbal at Brandy Brandy [name] time.  Palliative care was consulted and she had acute renal failure with creatinine of 2.5.  A PEG tube was placed.  Since then she gets most of her food via tube feeds and rarely eats by mouth.  She was last seen by cardiology 04/2019.  Her most recent echo 10/2018 revealed LVEF 35 to 40% with moderate LVH and grade 2 diastolic dysfunction.  Her systolic function had improved from 20 to 25% 03/2018.  She was admitted to [name] hospital with GI bleed in Brandy [facility]andy [name] setting of Eliquis.  Hospitalization was complicated by community-acquired pneumonia.  She was dound to have erosive gastritis and colonic polpys.  She was instructed to hold Eliquis until 01/26/20.  Brandy Brandy [name] Brandy [name] was last seen in [name] ED 01/29/2020 with acute onset of shortness of breath. High-sensitivity troponin was elevated to 207. EKG was without acute ischemic changes. BNP was elevated to 2179. She was given a dose of IV Lasix and instructed to take Lasix 40 mg daily at home. She notes that her breathing is much better and back to baseline. She denies any lower extremity edema, orthopnea, or PND. She never had lower extremity edema. However she did note bloating and abdominal distention. She has had some constant right lower quadrant discomfort. She did start back taking  Eliquis as instructed on 10/30. She denies any recurrent melena or hematochezia. At baseline she is in bed or in her wheelchair. She is unable to stand or walk. Therefore she cannot weigh herself. Her husband helps care for her and she also has an home health care. They note that her appetite has been down significantly for Brandy Brandy [name] last 3 weeks. This has been ongoing since when she had her pneumonia. She gets her nutrition through [facility]andy [facility] [name] PEG tube.   Past Medical History:  Diagnosis Date  . Cardiomyopathy    a. EF of 25% in 07/2006 b. EF normalized by repeat echo in 2011 c. EF 35-40% by echo in 05/2017 with cath showing mild nonobstructive CAD  . Chronic combined systolic and diastolic CHF (congestive heart failure) ([place])   . Colon polyps   . Diverticulosis of colon   . Hypertension   . Hypertensive heart disease   . Osteopenia   . Paroxysmal atrial fibrillation ([place]) 10/10/2018  . Stroke ([place])   . TIA (transient ischemic attack)   . Tobacco abuse    50 pack year    Past Surgical History:  Procedure Laterality Date  . BREAST BIOPSY    . CARPAL TUNNEL RELEASE     right hand  . CERVICAL FUSION    . CESAREAN SECTION     2 times  . COLONOSCOPY  7106   pt uncertain as to whether polypectomy required or performed  . COLONOSCOPY WITH PROPOFOL N/A 01/23/2020  Procedure: COLONOSCOPY WITH PROPOFOL;  Surgeon: Rogene Houston, MD;  [facility];  Service: Endoscopy;  Laterality: N/A;  . ECTOPIC PREGNANCY SURGERY    . ESOPHAGOGASTRODUODENOSCOPY (EGD) WITH PROPOFOL N/A 10/13/2018   Procedure: ESOPHAGOGASTRODUODENOSCOPY (EGD) WITH PROPOFOL;  Surgeon: Aviva Signs, MD;  Location: [facility];  Service: General;  Laterality: N/A;  . ESOPHAGOGASTRODUODENOSCOPY (EGD) WITH PROPOFOL N/A 01/23/2020   Procedure: ESOPHAGOGASTRODUODENOSCOPY (EGD) WITH PROPOFOL;  Surgeon: Rogene Houston, MD;  [facility];  Service: Endoscopy;  Laterality: N/A;  . IR ANGIO VERTEBRAL SEL SUBCLAVIAN INNOMINATE  UNI R MOD SED  02/10/2018  . IR CT HEAD LTD  02/10/2018  . IR PERCUTANEOUS ART THROMBECTOMY/INFUSION INTRACRANIAL INC DIAG ANGIO  02/10/2018  . PEG PLACEMENT N/A 10/13/2018   Procedure: PERCUTANEOUS ENDOSCOPIC GASTROSTOMY (PEG) PLACEMENT;  Surgeon: Aviva Signs, MD;  Location: [facility];  Service: General;  Laterality: N/A;  . PEG PLACEMENT N/A 12/04/2019   Procedure: PERCUTANEOUS ENDOSCOPIC GASTROSTOMY (PEG) REPLACEMENT;  Surgeon: Aviva Signs, MD;  [facility];  Service: Gastroenterology;  Laterality: N/A;  . PEG PLACEMENT N/A 12/12/2019   Procedure: PERCUTANEOUS ENDOSCOPIC GASTROSTOMY (PEG) REPLACEMENT;  Surgeon: Aviva Signs, MD;  [facility];  Service: Gastroenterology;  Laterality: N/A;  . POLYPECTOMY  01/23/2020   Procedure: POLYPECTOMY;  Surgeon: Rogene Houston, MD;  [facility];  Service: Endoscopy;;  . RADIOLOGY WITH ANESTHESIA N/A 02/10/2018   Procedure: RADIOLOGY WITH ANESTHESIA;  Surgeon: Luanne Bras, MD;  Location: [facility];  Service: Radiology;  Laterality: N/A;  . RIGHT/LEFT HEART CATH AND CORONARY ANGIOGRAPHY N/A 06/03/2017   Procedure: RIGHT/LEFT HEART CATH AND CORONARY ANGIOGRAPHY;  Surgeon: Jettie Booze, MD;  Location: [facility];  Service: Cardiovascular;  Laterality: N/A;  . TEE WITHOUT CARDIOVERSION N/A 02/13/2018   Procedure: TRANSESOPHAGEAL ECHOCARDIOGRAM (TEE);  Surgeon: Sueanne Margarita, MD;  Location: [facility];  Service: Cardiovascular;  Laterality: N/A;  . TONSILLECTOMY       Current Outpatient Medications  Medication Sig Dispense Refill  . acetaminophen (TYLENOL) 325 MG tablet Take 2 tablets (650 mg total) by mouth every 4 (four) hours as needed for mild pain (or temp > 37.5 C (99.5 F)). (Brandy [name] taking differently: Take 650 mg by mouth every 6 (six) hours as needed for mild pain or moderate pain. )    . atorvastatin (LIPITOR) 10 MG tablet TAKE 1 TABLET BY MOUTH EVERY DAY AT 6 PM 90 tablet 2  .  Budeson-Glycopyrrol-Formoterol (BREZTRI AEROSPHERE) 160-9-4.8 MCG/ACT AERO Inhale 2 Inhalers into Brandy Brandy [name] lungs in Brandy Brandy [name] morning and at bedtime.    . carvedilol (COREG) 3.125 MG tablet Take 3.125 mg by mouth 2 (two) times daily with a meal.    . cetirizine (ZYRTEC) 10 MG tablet Take 10 mg by mouth daily.    . citalopram (CELEXA) 10 MG tablet Take 10 mg by mouth daily.    Marland Kitchen donepezil (ARICEPT) 5 MG tablet Take 1 tablet by mouth at bedtime.  1  . ELIQUIS 2.5 MG TABS tablet Take 1 tablet (2.5 mg total) by mouth 2 (two) times daily. 60 tablet   . ferrous sulfate 324 MG TBEC Take 324 mg by mouth. M/W/F    . furosemide (LASIX) 20 MG tablet Take 1 tablet (20 mg total) by mouth daily. 30 tablet 5  . isosorbide mononitrate (IMDUR) 30 MG 24 hr tablet TAKE 1/2 TABLET(15 MG) BY MOUTH DAILY 45 tablet 2  . Melatonin 5 MG TABS Take by mouth.    Marland Kitchen  Multiple Vitamin (MULTIVITAMIN) LIQD Take 15 mLs by mouth daily. 870 mL 99  . Nutritional Supplements (FEEDING SUPPLEMENT, OSMOLITE 1.5 CAL,) LIQD Place 120 mLs into feeding tube 5 (five) times daily. 237 mL 99  . pantoprazole (PROTONIX) 40 MG tablet Take 1 tablet (40 mg total) by mouth daily before breakfast. 30 tablet 1   No current facility-administered medications for this visit.   Facility-Administered Medications Ordered in Other Visits  Medication Dose Route Frequency Provider Last Rate Last Admin  . iohexol (OMNIPAQUE) 300 MG/ML solution 50 mL  50 mL Per Tube Once PRN Long, Wonda Olds, MD        Allergies:   Lisinopril    Social History:  Brandy Brandy [name] Brandy [name]  reports that she quit smoking about 11 years ago. Her smoking use included cigarettes. She started smoking about 59 years ago. She has a 50.00 pack-year smoking history. She has never used smokeless tobacco. She reports that she does not drink alcohol and does not use drugs.   Family History:  Brandy Brandy [name] Brandy [name]'s family history includes Cancer in her paternal grandmother; Hypertension in her brother, father, maternal  grandmother, and mother; Stroke in her maternal grandmother.    ROS:  Please see Brandy Brandy [name] history of present illness.   Otherwise, review of systems are positive for none.   All other systems are reviewed and negative.    PHYSICAL EXAM: VS:  BP 126/90 (BP Location: Right Arm, Brandy [name] Position: Sitting, Cuff Size: Normal)   Pulse 87   Ht 5\' 4"  (1.626 m)   BMI 22.18 kg/m  , BMI Body mass index is 22.18 kg/m. GENERAL:  Chronically ill-appearing.  Wheelchair bound. HEENT:  Pupils equal round and reactive, fundi not visualized, oral mucosa unremarkable NECK:  No jugular venous distention, waveform within normal limits, carotid upstroke brisk and symmetric, no bruits LUNGS:  Clear to auscultation bilaterally HEART:  RRR.  PMI not displaced or sustained,S1 and S2 within normal limits, no S3, no S4, no clicks, no rubs, no murmurs ABD:  Flat, positive bowel sounds normal in frequency in pitch, no bruits, no rebound, no guarding, no midline pulsatile mass, no hepatomegaly, no splenomegaly EXT:  2 plus pulses throughout, no edema, no cyanosis no clubbing SKIN:  No rashes no nodules NEURO: L hemiparesis PSYCH:  Slow to respond but appropriate responses   EKG:  EKG is ordered today. Brandy Brandy [name] ekg ordered today demonstrates sinus rhythm. Rate 87 bpm. LAFB. Prior anteroseptal infarct.   Recent Labs: 01/23/2020: Magnesium 2.0 01/29/2020: ALT 15; B Natriuretic Peptide 2,179.2; BUN 35; Creatinine, Ser 1.57; Hemoglobin 10.7; Platelets 246; Potassium 4.6; Sodium 139    Lipid Panel    Component Value Date/Time   CHOL 93 02/11/2018 0447   TRIG 42 02/11/2018 0447   HDL 33 (L) 02/11/2018 0447   CHOLHDL 2.8 02/11/2018 0447   VLDL 8 02/11/2018 0447   LDLCALC 52 02/11/2018 0447      Wt Readings from Last 3 Encounters:  01/23/20 129 lb 3 oz (58.6 kg)  12/04/19 124 lb (56.2 kg)  06/05/19 124 lb (56.2 kg)      ASSESSMENT AND PLAN:  # Chronic systolic and diastolic heart failure:  LVEF improved to 35 to  40% on her most recent echo.  Brandy Brandy [name] Brandy [name] had an acute heart failure exacerbation. She is doing much better and her breathing seems to be back to baseline. She looks to be euvolemic on exam today. We will check a basic metabolic panel. Plan to reduce furosemide to 20 mg daily.  Continue carvedilol and Imdur.  Blood pressure and renal function allow in Brandy Brandy [name] future would add Entresto and/or spironolactone.  # PAF: # GI Bleed:  She is currently in sinus rhythm. She did have a recent GI bleed and started back taking Eliquis on 10/30. She has some constant lower abdominal pain. We will repeat a CBC today to make sure her H/H is stable. Continue carvedilol. She is currently in sinus rhythm.  # CAD:  On cath in 2019 she had 25% diagonal and 25% mid RCA disease.  She is not on aspirin given that she is on Eliquis.  Continue carvedilol and atorvastatin.  # Prior stroke with L hemiparesis: Continue secondary prevention with Eliquis and lipid control with atorvastatin.   Current medicines are reviewed at length with Brandy Brandy [name] Brandy [name] today.  Brandy Brandy [name] Brandy [name] does not have concerns regarding medicines.  Brandy Brandy [name] following changes have been made:  Continue lasix 20mg  daily  Labs/ tests ordered today include:   Orders Placed This Encounter  Procedures  . Basic metabolic panel  . CBC with Differential/Platelet  . EKG 12-Lead     Disposition:   FU with Zakaria Fromer C. Oval Linsey, MD, [facility] in 3 months     Signed, Landers Prajapati C. Oval Linsey, MD, [facility]  02/01/2020 12:58 PM    [place]

## 2020-02-02 LAB — BASIC METABOLIC PANEL
BUN/Creatinine Ratio: 24 (ref 12–28)
BUN: 37 mg/dL — ABNORMAL HIGH (ref 8–27)
CO2: 25 mmol/L (ref 20–29)
Calcium: 9.9 mg/dL (ref 8.7–10.3)
Chloride: 99 mmol/L (ref 96–106)
Creatinine, Ser: 1.52 mg/dL — ABNORMAL HIGH (ref 0.57–1.00)
GFR calc Af Amer: 38 mL/min/{1.73_m2} — ABNORMAL LOW (ref >59)
GFR calc non Af Amer: 33 mL/min/{1.73_m2} — ABNORMAL LOW (ref >59)
Glucose: 75 mg/dL (ref 65–99)
Potassium: 4.6 mmol/L (ref 3.5–5.2)
Sodium: 139 mmol/L (ref 134–144)

## 2020-02-02 LAB — CBC WITH DIFFERENTIAL/PLATELET
Basophils Absolute: 0 10*3/uL (ref 0.0–0.2)
Basos: 1 %
EOS (ABSOLUTE): 0.2 10*3/uL (ref 0.0–0.4)
Eos: 4 %
Hematocrit: 38.2 % (ref 34.0–46.6)
Hemoglobin: 11.8 g/dL (ref 11.1–15.9)
Immature Grans (Abs): 0 10*3/uL (ref 0.0–0.1)
Immature Granulocytes: 0 %
Lymphocytes Absolute: 1.1 10*3/uL (ref 0.7–3.1)
Lymphs: 18 %
MCH: 28.6 pg (ref 26.6–33.0)
MCHC: 30.9 g/dL — ABNORMAL LOW (ref 31.5–35.7)
MCV: 93 fL (ref 79–97)
Monocytes Absolute: 0.5 10*3/uL (ref 0.1–0.9)
Monocytes: 9 %
Neutrophils Absolute: 4.2 10*3/uL (ref 1.4–7.0)
Neutrophils: 68 %
Platelets: 284 10*3/uL (ref 150–450)
RBC: 4.13 x10E6/uL (ref 3.77–5.28)
RDW: 12.2 % (ref 11.7–15.4)
WBC: 6.1 10*3/uL (ref 3.4–10.8)

## 2020-02-03 DIAGNOSIS — I480 Paroxysmal atrial fibrillation: Secondary | ICD-10-CM | POA: Diagnosis not present

## 2020-02-03 DIAGNOSIS — I5032 Chronic diastolic (congestive) heart failure: Secondary | ICD-10-CM | POA: Diagnosis not present

## 2020-02-03 DIAGNOSIS — E785 Hyperlipidemia, unspecified: Secondary | ICD-10-CM | POA: Diagnosis not present

## 2020-02-03 DIAGNOSIS — D631 Anemia in chronic kidney disease: Secondary | ICD-10-CM | POA: Diagnosis not present

## 2020-02-03 DIAGNOSIS — I69354 Hemiplegia and hemiparesis following cerebral infarction affecting left non-dominant side: Secondary | ICD-10-CM | POA: Diagnosis not present

## 2020-02-03 DIAGNOSIS — N183 Chronic kidney disease, stage 3 unspecified: Secondary | ICD-10-CM | POA: Diagnosis not present

## 2020-02-03 DIAGNOSIS — I130 Hypertensive heart and chronic kidney disease with heart failure and stage 1 through stage 4 chronic kidney disease, or unspecified chronic kidney disease: Secondary | ICD-10-CM | POA: Diagnosis not present

## 2020-02-03 DIAGNOSIS — I5023 Acute on chronic systolic (congestive) heart failure: Secondary | ICD-10-CM | POA: Diagnosis not present

## 2020-02-03 DIAGNOSIS — J449 Chronic obstructive pulmonary disease, unspecified: Secondary | ICD-10-CM | POA: Diagnosis not present

## 2020-02-06 DIAGNOSIS — N183 Chronic kidney disease, stage 3 unspecified: Secondary | ICD-10-CM | POA: Diagnosis not present

## 2020-02-06 DIAGNOSIS — D631 Anemia in chronic kidney disease: Secondary | ICD-10-CM | POA: Diagnosis not present

## 2020-02-06 DIAGNOSIS — I69354 Hemiplegia and hemiparesis following cerebral infarction affecting left non-dominant side: Secondary | ICD-10-CM | POA: Diagnosis not present

## 2020-02-06 DIAGNOSIS — I480 Paroxysmal atrial fibrillation: Secondary | ICD-10-CM | POA: Diagnosis not present

## 2020-02-06 DIAGNOSIS — E785 Hyperlipidemia, unspecified: Secondary | ICD-10-CM | POA: Diagnosis not present

## 2020-02-06 DIAGNOSIS — I130 Hypertensive heart and chronic kidney disease with heart failure and stage 1 through stage 4 chronic kidney disease, or unspecified chronic kidney disease: Secondary | ICD-10-CM | POA: Diagnosis not present

## 2020-02-06 DIAGNOSIS — J449 Chronic obstructive pulmonary disease, unspecified: Secondary | ICD-10-CM | POA: Diagnosis not present

## 2020-02-06 DIAGNOSIS — I5032 Chronic diastolic (congestive) heart failure: Secondary | ICD-10-CM | POA: Diagnosis not present

## 2020-02-06 DIAGNOSIS — I5023 Acute on chronic systolic (congestive) heart failure: Secondary | ICD-10-CM | POA: Diagnosis not present

## 2020-02-07 DIAGNOSIS — J449 Chronic obstructive pulmonary disease, unspecified: Secondary | ICD-10-CM | POA: Diagnosis not present

## 2020-02-07 DIAGNOSIS — N183 Chronic kidney disease, stage 3 unspecified: Secondary | ICD-10-CM | POA: Diagnosis not present

## 2020-02-07 DIAGNOSIS — I69354 Hemiplegia and hemiparesis following cerebral infarction affecting left non-dominant side: Secondary | ICD-10-CM | POA: Diagnosis not present

## 2020-02-07 DIAGNOSIS — I480 Paroxysmal atrial fibrillation: Secondary | ICD-10-CM | POA: Diagnosis not present

## 2020-02-07 DIAGNOSIS — I130 Hypertensive heart and chronic kidney disease with heart failure and stage 1 through stage 4 chronic kidney disease, or unspecified chronic kidney disease: Secondary | ICD-10-CM | POA: Diagnosis not present

## 2020-02-07 DIAGNOSIS — I5023 Acute on chronic systolic (congestive) heart failure: Secondary | ICD-10-CM | POA: Diagnosis not present

## 2020-02-07 DIAGNOSIS — D631 Anemia in chronic kidney disease: Secondary | ICD-10-CM | POA: Diagnosis not present

## 2020-02-07 DIAGNOSIS — I5032 Chronic diastolic (congestive) heart failure: Secondary | ICD-10-CM | POA: Diagnosis not present

## 2020-02-12 DIAGNOSIS — M1830 Unilateral post-traumatic osteoarthritis of first carpometacarpal joint, unspecified hand: Secondary | ICD-10-CM | POA: Diagnosis not present

## 2020-02-12 DIAGNOSIS — I130 Hypertensive heart and chronic kidney disease with heart failure and stage 1 through stage 4 chronic kidney disease, or unspecified chronic kidney disease: Secondary | ICD-10-CM | POA: Diagnosis not present

## 2020-02-12 DIAGNOSIS — Z7901 Long term (current) use of anticoagulants: Secondary | ICD-10-CM | POA: Diagnosis not present

## 2020-02-12 DIAGNOSIS — N183 Chronic kidney disease, stage 3 unspecified: Secondary | ICD-10-CM | POA: Diagnosis not present

## 2020-02-12 DIAGNOSIS — Z931 Gastrostomy status: Secondary | ICD-10-CM | POA: Diagnosis not present

## 2020-02-12 DIAGNOSIS — I480 Paroxysmal atrial fibrillation: Secondary | ICD-10-CM | POA: Diagnosis not present

## 2020-02-12 DIAGNOSIS — I69354 Hemiplegia and hemiparesis following cerebral infarction affecting left non-dominant side: Secondary | ICD-10-CM | POA: Diagnosis not present

## 2020-02-12 DIAGNOSIS — J449 Chronic obstructive pulmonary disease, unspecified: Secondary | ICD-10-CM | POA: Diagnosis not present

## 2020-02-12 DIAGNOSIS — I5023 Acute on chronic systolic (congestive) heart failure: Secondary | ICD-10-CM | POA: Diagnosis not present

## 2020-02-12 DIAGNOSIS — E785 Hyperlipidemia, unspecified: Secondary | ICD-10-CM | POA: Diagnosis not present

## 2020-02-12 DIAGNOSIS — I5032 Chronic diastolic (congestive) heart failure: Secondary | ICD-10-CM | POA: Diagnosis not present

## 2020-02-12 DIAGNOSIS — D631 Anemia in chronic kidney disease: Secondary | ICD-10-CM | POA: Diagnosis not present

## 2020-02-12 DIAGNOSIS — I110 Hypertensive heart disease with heart failure: Secondary | ICD-10-CM | POA: Diagnosis not present

## 2020-02-19 DIAGNOSIS — I480 Paroxysmal atrial fibrillation: Secondary | ICD-10-CM | POA: Diagnosis not present

## 2020-02-19 DIAGNOSIS — I5023 Acute on chronic systolic (congestive) heart failure: Secondary | ICD-10-CM | POA: Diagnosis not present

## 2020-02-19 DIAGNOSIS — I69354 Hemiplegia and hemiparesis following cerebral infarction affecting left non-dominant side: Secondary | ICD-10-CM | POA: Diagnosis not present

## 2020-02-19 DIAGNOSIS — I5032 Chronic diastolic (congestive) heart failure: Secondary | ICD-10-CM | POA: Diagnosis not present

## 2020-02-19 DIAGNOSIS — D631 Anemia in chronic kidney disease: Secondary | ICD-10-CM | POA: Diagnosis not present

## 2020-02-19 DIAGNOSIS — N183 Chronic kidney disease, stage 3 unspecified: Secondary | ICD-10-CM | POA: Diagnosis not present

## 2020-02-19 DIAGNOSIS — J449 Chronic obstructive pulmonary disease, unspecified: Secondary | ICD-10-CM | POA: Diagnosis not present

## 2020-02-19 DIAGNOSIS — E785 Hyperlipidemia, unspecified: Secondary | ICD-10-CM | POA: Diagnosis not present

## 2020-02-19 DIAGNOSIS — I130 Hypertensive heart and chronic kidney disease with heart failure and stage 1 through stage 4 chronic kidney disease, or unspecified chronic kidney disease: Secondary | ICD-10-CM | POA: Diagnosis not present

## 2020-02-21 DIAGNOSIS — I4891 Unspecified atrial fibrillation: Secondary | ICD-10-CM | POA: Diagnosis not present

## 2020-02-21 DIAGNOSIS — J449 Chronic obstructive pulmonary disease, unspecified: Secondary | ICD-10-CM | POA: Diagnosis not present

## 2020-02-21 DIAGNOSIS — Z993 Dependence on wheelchair: Secondary | ICD-10-CM | POA: Diagnosis not present

## 2020-02-27 DIAGNOSIS — Z993 Dependence on wheelchair: Secondary | ICD-10-CM | POA: Diagnosis not present

## 2020-03-01 DIAGNOSIS — I69354 Hemiplegia and hemiparesis following cerebral infarction affecting left non-dominant side: Secondary | ICD-10-CM | POA: Diagnosis not present

## 2020-03-01 DIAGNOSIS — I5023 Acute on chronic systolic (congestive) heart failure: Secondary | ICD-10-CM | POA: Diagnosis not present

## 2020-03-01 DIAGNOSIS — E785 Hyperlipidemia, unspecified: Secondary | ICD-10-CM | POA: Diagnosis not present

## 2020-03-01 DIAGNOSIS — I480 Paroxysmal atrial fibrillation: Secondary | ICD-10-CM | POA: Diagnosis not present

## 2020-03-01 DIAGNOSIS — N183 Chronic kidney disease, stage 3 unspecified: Secondary | ICD-10-CM | POA: Diagnosis not present

## 2020-03-01 DIAGNOSIS — I130 Hypertensive heart and chronic kidney disease with heart failure and stage 1 through stage 4 chronic kidney disease, or unspecified chronic kidney disease: Secondary | ICD-10-CM | POA: Diagnosis not present

## 2020-03-01 DIAGNOSIS — J449 Chronic obstructive pulmonary disease, unspecified: Secondary | ICD-10-CM | POA: Diagnosis not present

## 2020-03-01 DIAGNOSIS — D631 Anemia in chronic kidney disease: Secondary | ICD-10-CM | POA: Diagnosis not present

## 2020-03-01 DIAGNOSIS — I5032 Chronic diastolic (congestive) heart failure: Secondary | ICD-10-CM | POA: Diagnosis not present

## 2020-03-12 ENCOUNTER — Telehealth: Payer: Self-pay | Admitting: Cardiovascular Disease

## 2020-03-12 ENCOUNTER — Other Ambulatory Visit: Payer: Self-pay

## 2020-03-12 DIAGNOSIS — I509 Heart failure, unspecified: Secondary | ICD-10-CM | POA: Diagnosis not present

## 2020-03-12 DIAGNOSIS — F325 Major depressive disorder, single episode, in full remission: Secondary | ICD-10-CM | POA: Diagnosis not present

## 2020-03-12 DIAGNOSIS — D6869 Other thrombophilia: Secondary | ICD-10-CM | POA: Diagnosis not present

## 2020-03-12 DIAGNOSIS — F0390 Unspecified dementia without behavioral disturbance: Secondary | ICD-10-CM | POA: Diagnosis not present

## 2020-03-12 DIAGNOSIS — J449 Chronic obstructive pulmonary disease, unspecified: Secondary | ICD-10-CM | POA: Diagnosis not present

## 2020-03-12 DIAGNOSIS — E785 Hyperlipidemia, unspecified: Secondary | ICD-10-CM | POA: Diagnosis not present

## 2020-03-12 DIAGNOSIS — I4891 Unspecified atrial fibrillation: Secondary | ICD-10-CM | POA: Diagnosis not present

## 2020-03-12 DIAGNOSIS — E261 Secondary hyperaldosteronism: Secondary | ICD-10-CM | POA: Diagnosis not present

## 2020-03-12 DIAGNOSIS — Z87891 Personal history of nicotine dependence: Secondary | ICD-10-CM | POA: Diagnosis not present

## 2020-03-12 DIAGNOSIS — Z888 Allergy status to other drugs, medicaments and biological substances status: Secondary | ICD-10-CM | POA: Diagnosis not present

## 2020-03-12 DIAGNOSIS — I69354 Hemiplegia and hemiparesis following cerebral infarction affecting left non-dominant side: Secondary | ICD-10-CM | POA: Diagnosis not present

## 2020-03-12 DIAGNOSIS — Z7901 Long term (current) use of anticoagulants: Secondary | ICD-10-CM | POA: Diagnosis not present

## 2020-03-12 DIAGNOSIS — Z9981 Dependence on supplemental oxygen: Secondary | ICD-10-CM | POA: Diagnosis not present

## 2020-03-12 MED ORDER — CARVEDILOL 3.125 MG PO TABS
3.1250 mg | ORAL_TABLET | Freq: Two times a day (BID) | ORAL | 3 refills | Status: DC
Start: 2020-03-12 — End: 2021-02-27

## 2020-03-12 NOTE — Telephone Encounter (Signed)
*  STAT* If patient is at the pharmacy, call can be transferred to refill team.   1. Which medications need to be refilled? (please list name of each medication and dose if known)  carvedilol (COREG) 3.125 MG tablet  Patient takes 1/2 of a 6.25 mg tablet twice daily   2. Which pharmacy/location (including street and city if local pharmacy) is medication to be sent to? [id number] - [place], [place]  3. Do they need a 30 day or 90 day supply? 90 with refills    This rx was written by the patients previous doctor. The patient needs [name] to write a new medciation

## 2020-03-22 DIAGNOSIS — Z993 Dependence on wheelchair: Secondary | ICD-10-CM | POA: Diagnosis not present

## 2020-03-22 DIAGNOSIS — I4891 Unspecified atrial fibrillation: Secondary | ICD-10-CM | POA: Diagnosis not present

## 2020-03-22 DIAGNOSIS — J449 Chronic obstructive pulmonary disease, unspecified: Secondary | ICD-10-CM | POA: Diagnosis not present

## 2020-03-29 DIAGNOSIS — Z993 Dependence on wheelchair: Secondary | ICD-10-CM | POA: Diagnosis not present

## 2020-04-22 DIAGNOSIS — J449 Chronic obstructive pulmonary disease, unspecified: Secondary | ICD-10-CM | POA: Diagnosis not present

## 2020-04-22 DIAGNOSIS — I4891 Unspecified atrial fibrillation: Secondary | ICD-10-CM | POA: Diagnosis not present

## 2020-04-22 DIAGNOSIS — Z993 Dependence on wheelchair: Secondary | ICD-10-CM | POA: Diagnosis not present

## 2020-05-07 ENCOUNTER — Other Ambulatory Visit: Payer: Self-pay

## 2020-05-07 ENCOUNTER — Ambulatory Visit (INDEPENDENT_AMBULATORY_CARE_PROVIDER_SITE_OTHER): Payer: Medicare PPO | Admitting: Cardiovascular Disease

## 2020-05-07 ENCOUNTER — Encounter: Payer: Self-pay | Admitting: Cardiovascular Disease

## 2020-05-07 VITALS — BP 111/74 | HR 56 | Ht 64.0 in | Wt 126.0 lb

## 2020-05-07 DIAGNOSIS — I5042 Chronic combined systolic (congestive) and diastolic (congestive) heart failure: Secondary | ICD-10-CM

## 2020-05-07 DIAGNOSIS — I639 Cerebral infarction, unspecified: Secondary | ICD-10-CM | POA: Diagnosis not present

## 2020-05-07 DIAGNOSIS — E782 Mixed hyperlipidemia: Secondary | ICD-10-CM | POA: Diagnosis not present

## 2020-05-07 DIAGNOSIS — I10 Essential (primary) hypertension: Secondary | ICD-10-CM

## 2020-05-07 DIAGNOSIS — I480 Paroxysmal atrial fibrillation: Secondary | ICD-10-CM

## 2020-05-07 NOTE — Patient Instructions (Signed)
Medication Instructions:  Your physician recommends that you continue on your current medications as directed. Please refer to the Current Medication list given to you today.  *If you need a refill on your cardiac medications before your next appointment, please call your pharmacy*  Lab Work: NONE  Testing/Procedures: NONE  Follow-Up: At [facility], you and your health needs are our priority.  As part of our continuing mission to provide you with exceptional heart care, we have created designated Provider Care Teams.  These Care Teams include your primary Cardiologist (physician) and Advanced Practice Providers (APPs -  Physician Assistants and Nurse Practitioners) who all work together to provide you with the care you need, when you need it.  We recommend signing up for the patient portal called "MyChart".  Sign up information is provided on this After Visit Summary.  MyChart is used to connect with patients for Virtual Visits (Telemedicine).  Patients are able to view lab/test results, encounter notes, upcoming appointments, etc.  Non-urgent messages can be sent to your provider as well.   To learn more about what you can do with MyChart, go to [email or web address].com.    Your next appointment:   6 month(s)  The format for your next appointment:   In Person  Provider:   You may see Tiffany Jesup, MD or one of the following Advanced Practice Providers on your designated Care Team:    Luke Kilroy, PA-C  Callie Goodrich, PA-C  Jesse Cleaver, FNP   

## 2020-05-07 NOTE — Progress Notes (Signed)
Cardiology Office Note   Date:  05/07/2020   ID:  Brandy [name], Brandy [name] 1940-11-25, MRN [id number]  PCP:  [name], MD  Cardiologist:   Skeet Latch, MD   No chief complaint on file.    History of Present Illness: Brandy [name] is a 80 y.o. female with chronic systolic and diastolic heart failure, hypertension, hyperlipidemia, nonobstructive CAD, paroxysmal atrial fibrillation, prior stroke with left hemiparesis, and COPD on home O2 here for follow-up.  She was previously a patient of Dr. Bronson Ing.  She was hospitalized 09/2018 with hypernatremia.  Her sodium was 151.  She was nonambulatory and nonverbal at the time.  Palliative care was consulted and she had acute renal failure with creatinine of 2.5.  A PEG tube was placed.  Since then she gets most of her food via tube feeds and rarely eats by mouth.  She was last seen by cardiology 04/2019.  Her most recent echo 10/2018 revealed LVEF 35 to 40% with moderate LVH and grade 2 diastolic dysfunction.  Her systolic function had improved from 20 to 25% 03/2018.  She was admitted to the hospital with GI bleed in the setting of Eliquis.  Hospitalization was complicated by community-acquired pneumonia.  She was dound to have erosive gastritis and colonic polpys.  She was instructed to hold Eliquis until 01/26/20.  Brandy [name] was seen in the ED 01/29/2020 with acute onset of shortness of breath. High-sensitivity troponin was elevated to 207. EKG was without acute ischemic changes. BNP was elevated to 2179. She was given a dose of IV Lasix and instructed to take Lasix 40 mg daily at home.  Follow-up her breathing was much better.  Furosemide was reduced to 20 mg a day.  Since that time she has not had any lower extremity edema.  She denies any chest pain, shortness of breath, orthopnea, or PND.  She did start back taking Eliquis as instructed on 10/30 and her CBC was stable at her last appointment. She denies any recurrent melena or hematochezia. At  baseline she is in bed or in her wheelchair. She is unable to stand or walk. Therefore she cannot weigh herself.  Her home health aide helps her to get some physical therapy in the bed.  Her husband notes that she has had some dark spots that are pruritic.  There is no erythema.  She has not changed anything lotions, body washes, or detergents recently.  This has been ongoing intermittently for a few weeks.  There are no blisters the morning of sores.  At home her blood pressure has ranged from the 110s-120s/60-70s.     Past Medical History:  Diagnosis Date  . Cardiomyopathy    a. EF of 25% in 07/2006 b. EF normalized by repeat echo in 2011 c. EF 35-40% by echo in 05/2017 with cath showing mild nonobstructive CAD  . Chronic combined systolic and diastolic CHF (congestive heart failure) ([place])   . Colon polyps   . Diverticulosis of colon   . Hypertension   . Hypertensive heart disease   . Osteopenia   . Paroxysmal atrial fibrillation ([place]) 10/10/2018  . Stroke ([place])   . TIA (transient ischemic attack)   . Tobacco abuse    50 pack year    Past Surgical History:  Procedure Laterality Date  . BREAST BIOPSY    . CARPAL TUNNEL RELEASE     right hand  . CERVICAL FUSION    . CESAREAN SECTION     2  times  . COLONOSCOPY  2831   pt uncertain as to whether polypectomy required or performed  . COLONOSCOPY WITH PROPOFOL N/A 01/23/2020   Procedure: COLONOSCOPY WITH PROPOFOL;  Surgeon: Rogene Houston, MD;  [facility];  Service: Endoscopy;  Laterality: N/A;  . ECTOPIC PREGNANCY SURGERY    . ESOPHAGOGASTRODUODENOSCOPY (EGD) WITH PROPOFOL N/A 10/13/2018   Procedure: ESOPHAGOGASTRODUODENOSCOPY (EGD) WITH PROPOFOL;  Surgeon: Aviva Signs, MD;  Location: [facility];  Service: General;  Laterality: N/A;  . ESOPHAGOGASTRODUODENOSCOPY (EGD) WITH PROPOFOL N/A 01/23/2020   Procedure: ESOPHAGOGASTRODUODENOSCOPY (EGD) WITH PROPOFOL;  Surgeon: Rogene Houston, MD;  [facility];  Service:  Endoscopy;  Laterality: N/A;  . IR ANGIO VERTEBRAL SEL SUBCLAVIAN INNOMINATE UNI R MOD SED  02/10/2018  . IR CT HEAD LTD  02/10/2018  . IR PERCUTANEOUS ART THROMBECTOMY/INFUSION INTRACRANIAL INC DIAG ANGIO  02/10/2018  . PEG PLACEMENT N/A 10/13/2018   Procedure: PERCUTANEOUS ENDOSCOPIC GASTROSTOMY (PEG) PLACEMENT;  Surgeon: Aviva Signs, MD;  Location: [facility];  Service: General;  Laterality: N/A;  . PEG PLACEMENT N/A 12/04/2019   Procedure: PERCUTANEOUS ENDOSCOPIC GASTROSTOMY (PEG) REPLACEMENT;  Surgeon: Aviva Signs, MD;  [facility];  Service: Gastroenterology;  Laterality: N/A;  . PEG PLACEMENT N/A 12/12/2019   Procedure: PERCUTANEOUS ENDOSCOPIC GASTROSTOMY (PEG) REPLACEMENT;  Surgeon: Aviva Signs, MD;  [facility];  Service: Gastroenterology;  Laterality: N/A;  . POLYPECTOMY  01/23/2020   Procedure: POLYPECTOMY;  Surgeon: Rogene Houston, MD;  [facility];  Service: Endoscopy;;  . RADIOLOGY WITH ANESTHESIA N/A 02/10/2018   Procedure: RADIOLOGY WITH ANESTHESIA;  Surgeon: Luanne Bras, MD;  Location: [facility];  Service: Radiology;  Laterality: N/A;  . RIGHT/LEFT HEART CATH AND CORONARY ANGIOGRAPHY N/A 06/03/2017   Procedure: RIGHT/LEFT HEART CATH AND CORONARY ANGIOGRAPHY;  Surgeon: Jettie Booze, MD;  Location: [place] [facility];  Service: Cardiovascular;  Laterality: N/A;  . TEE WITHOUT CARDIOVERSION N/A 02/13/2018   Procedure: TRANSESOPHAGEAL ECHOCARDIOGRAM (TEE);  Surgeon: Sueanne Margarita, MD;  Location: [facility];  Service: Cardiovascular;  Laterality: N/A;  . TONSILLECTOMY       Current Outpatient Medications  Medication Sig Dispense Refill  . acetaminophen (TYLENOL) 325 MG tablet Take 2 tablets (650 mg total) by mouth every 4 (four) hours as needed for mild pain (or temp > 37.5 C (99.5 F)). (Patient taking differently: Take 650 mg by mouth every 6 (six) hours as needed for mild pain or moderate pain.)    . atorvastatin (LIPITOR) 10 MG  tablet TAKE 1 TABLET BY MOUTH EVERY DAY AT 6 PM 90 tablet 2  . Budeson-Glycopyrrol-Formoterol (BREZTRI AEROSPHERE) 160-9-4.8 MCG/ACT AERO Inhale 2 Inhalers into the lungs in the morning and at bedtime.    . carvedilol (COREG) 3.125 MG tablet Take 1 tablet (3.125 mg total) by mouth 2 (two) times daily with a meal. 180 tablet 3  . cetirizine (ZYRTEC) 10 MG tablet Take 10 mg by mouth daily.    . citalopram (CELEXA) 10 MG tablet Take 10 mg by mouth daily.    Marland Kitchen donepezil (ARICEPT) 5 MG tablet Take 1 tablet by mouth at bedtime.  1  . ELIQUIS 2.5 MG TABS tablet Take 1 tablet (2.5 mg total) by mouth 2 (two) times daily. 60 tablet   . ferrous sulfate 324 MG TBEC Take 324 mg by mouth. M/W/F    . furosemide (LASIX) 20 MG tablet Take 1 tablet (20 mg total) by mouth daily. 30 tablet 5  . isosorbide  mononitrate (IMDUR) 30 MG 24 hr tablet TAKE 1/2 TABLET(15 MG) BY MOUTH DAILY 45 tablet 2  . Melatonin 5 MG TABS Take by mouth.    . Multiple Vitamin (MULTIVITAMIN) LIQD Take 15 mLs by mouth daily. 870 mL 99  . Nutritional Supplements (FEEDING SUPPLEMENT, OSMOLITE 1.5 CAL,) LIQD Place 120 mLs into feeding tube 5 (five) times daily. 237 mL 99  . pantoprazole (PROTONIX) 40 MG tablet Take 1 tablet (40 mg total) by mouth daily before breakfast. 30 tablet 1   No current facility-administered medications for this visit.   Facility-Administered Medications Ordered in Other Visits  Medication Dose Route Frequency Provider Last Rate Last Admin  . iohexol (OMNIPAQUE) 300 MG/ML solution 50 mL  50 mL Per Tube Once PRN Long, Wonda Olds, MD        Allergies:   Lisinopril    Social History:  The patient  reports that she quit smoking about 12 years ago. Her smoking use included cigarettes. She started smoking about 59 years ago. She has a 50.00 pack-year smoking history. She has never used smokeless tobacco. She reports that she does not drink alcohol and does not use drugs.   Family History:  The patient's family history  includes Cancer in her paternal grandmother; Hypertension in her brother, father, maternal grandmother, and mother; Stroke in her maternal grandmother.    ROS:  Please see the history of present illness.   Otherwise, review of systems are positive for none.   All other systems are reviewed and negative.    PHYSICAL EXAM: VS:  BP 111/74   Pulse (!) 56   Ht 5\' 4"  (1.626 m)   Wt 126 lb (57.2 kg)   SpO2 100%   BMI 21.63 kg/m  , BMI Body mass index is 21.63 kg/m. GENERAL:  Chronically ill-appearing.  Wheelchair bound. HEENT:  Pupils equal round and reactive, fundi not visualized, oral mucosa unremarkable NECK:  No jugular venous distention, waveform within normal limits, carotid upstroke brisk and symmetric, no bruits LUNGS:  Clear to auscultation bilaterally HEART:  RRR.  PMI not displaced or sustained,S1 and S2 within normal limits, no S3, no S4, no clicks, no rubs, no murmurs ABD:  Flat, positive bowel sounds normal in frequency in pitch, no bruits, no rebound, no guarding, no midline pulsatile mass, no hepatomegaly, no splenomegaly EXT:  2 plus pulses throughout, no edema, no cyanosis no clubbing SKIN:  No rashes no nodules NEURO: L hemiparesis PSYCH:  Slow to respond but appropriate responses   EKG:  EKG is ordered today. The ekg ordered 02/01/20 demonstrates sinus rhythm. Rate 87 bpm. LAFB. Prior anteroseptal infarct. 05/08/19: Sinus bradycardia.  Rate 56 bpm. LAFB.  Prior septal infarct.   Recent Labs: 01/23/2020: Magnesium 2.0 01/29/2020: ALT 15; B Natriuretic Peptide 2,179.2 02/01/2020: BUN 37; Creatinine, Ser 1.52; Hemoglobin 11.8; Platelets 284; Potassium 4.6; Sodium 139    Lipid Panel    Component Value Date/Time   CHOL 93 02/11/2018 0447   TRIG 42 02/11/2018 0447   HDL 33 (L) 02/11/2018 0447   CHOLHDL 2.8 02/11/2018 0447   VLDL 8 02/11/2018 0447   LDLCALC 52 02/11/2018 0447      Wt Readings from Last 3 Encounters:  05/07/20 126 lb (57.2 kg)  01/23/20 129 lb 3 oz  (58.6 kg)  12/04/19 124 lb (56.2 kg)      ASSESSMENT AND PLAN:  # Chronic systolic and diastolic heart failure:  LVEF improved to 35 to 40% on her most recent echo.  Ms. Royal had an acute heart failure exacerbation.  She is currently euvolemic and has no heart failure symptoms.  We are able to titrate her heart failure regimen due to bradycardia and low blood pressure.  She has CKD 3b.  Therefore we will not start Entresto or an ARB.  Continue carvedilol and Imdur.  # PAF: # GI Bleed:  She is currently in sinus rhythm.  She has a history of GI bleed but H/H was stable when checked at her last clinic appointment.  No recurrent melena or hematochezia.  Continue carvedilol. She is currently in sinus rhythm.  # CAD:  On cath in 2019 she had 25% diagonal and 25% mid RCA disease.  She is not on aspirin given that she is on Eliquis.  Continue carvedilol, Imdur, and atorvastatin.  # Prior stroke with L hemiparesis: Continue secondary prevention with Eliquis and lipid control with atorvastatin.   Current medicines are reviewed at length with the patient today.  The patient does not have concerns regarding medicines.  The following changes have been made:  Continue lasix 20mg  daily  Labs/ tests ordered today include:   No orders of the defined types were placed in this encounter.    Disposition:   FU with Savaughn Karwowski C. Oval Linsey, MD, [facility] in 6 months     Signed, Dawn Convery C. Oval Linsey, MD, [facility]  05/07/2020 12:31 PM    [facility]

## 2020-05-19 NOTE — Addendum Note (Signed)
Addended by: Wonda Horner on: 05/19/2020 01:57 PM   Modules accepted: Orders

## 2020-05-20 DIAGNOSIS — Z7189 Other specified counseling: Secondary | ICD-10-CM | POA: Diagnosis not present

## 2020-05-20 DIAGNOSIS — I5042 Chronic combined systolic (congestive) and diastolic (congestive) heart failure: Secondary | ICD-10-CM | POA: Diagnosis not present

## 2020-05-20 DIAGNOSIS — Z7901 Long term (current) use of anticoagulants: Secondary | ICD-10-CM | POA: Diagnosis not present

## 2020-05-20 DIAGNOSIS — I69354 Hemiplegia and hemiparesis following cerebral infarction affecting left non-dominant side: Secondary | ICD-10-CM | POA: Diagnosis not present

## 2020-05-20 DIAGNOSIS — I480 Paroxysmal atrial fibrillation: Secondary | ICD-10-CM | POA: Diagnosis not present

## 2020-05-20 DIAGNOSIS — R21 Rash and other nonspecific skin eruption: Secondary | ICD-10-CM | POA: Diagnosis not present

## 2020-05-20 DIAGNOSIS — Z931 Gastrostomy status: Secondary | ICD-10-CM | POA: Diagnosis not present

## 2020-05-20 DIAGNOSIS — N183 Chronic kidney disease, stage 3 unspecified: Secondary | ICD-10-CM | POA: Diagnosis not present

## 2020-05-23 DIAGNOSIS — I4891 Unspecified atrial fibrillation: Secondary | ICD-10-CM | POA: Diagnosis not present

## 2020-05-23 DIAGNOSIS — J449 Chronic obstructive pulmonary disease, unspecified: Secondary | ICD-10-CM | POA: Diagnosis not present

## 2020-06-20 DIAGNOSIS — I4891 Unspecified atrial fibrillation: Secondary | ICD-10-CM | POA: Diagnosis not present

## 2020-06-20 DIAGNOSIS — J449 Chronic obstructive pulmonary disease, unspecified: Secondary | ICD-10-CM | POA: Diagnosis not present

## 2020-07-21 DIAGNOSIS — J449 Chronic obstructive pulmonary disease, unspecified: Secondary | ICD-10-CM | POA: Diagnosis not present

## 2020-07-21 DIAGNOSIS — I4891 Unspecified atrial fibrillation: Secondary | ICD-10-CM | POA: Diagnosis not present

## 2020-07-25 DIAGNOSIS — F324 Major depressive disorder, single episode, in partial remission: Secondary | ICD-10-CM | POA: Diagnosis not present

## 2020-07-25 DIAGNOSIS — Z931 Gastrostomy status: Secondary | ICD-10-CM | POA: Diagnosis not present

## 2020-07-25 DIAGNOSIS — E785 Hyperlipidemia, unspecified: Secondary | ICD-10-CM | POA: Diagnosis not present

## 2020-07-25 DIAGNOSIS — K219 Gastro-esophageal reflux disease without esophagitis: Secondary | ICD-10-CM | POA: Diagnosis not present

## 2020-07-25 DIAGNOSIS — G3184 Mild cognitive impairment, so stated: Secondary | ICD-10-CM | POA: Diagnosis not present

## 2020-07-25 DIAGNOSIS — I69354 Hemiplegia and hemiparesis following cerebral infarction affecting left non-dominant side: Secondary | ICD-10-CM | POA: Diagnosis not present

## 2020-07-25 DIAGNOSIS — J45909 Unspecified asthma, uncomplicated: Secondary | ICD-10-CM | POA: Diagnosis not present

## 2020-07-25 DIAGNOSIS — I2510 Atherosclerotic heart disease of native coronary artery without angina pectoris: Secondary | ICD-10-CM | POA: Diagnosis not present

## 2020-07-25 DIAGNOSIS — I10 Essential (primary) hypertension: Secondary | ICD-10-CM | POA: Diagnosis not present

## 2020-07-26 ENCOUNTER — Other Ambulatory Visit: Payer: Self-pay | Admitting: Physician Assistant

## 2020-07-28 ENCOUNTER — Telehealth: Payer: Self-pay | Admitting: General Surgery

## 2020-07-28 NOTE — Telephone Encounter (Signed)
Patient's husband, Sabrin Dunlevy, came by the office asking that Dr. Arnoldo Morale please call him in regards to the patient.The patient's feeding tube has came out and the patient is eating pretty well. He was wondering if she needed to have feeding tube replaced. Please call him at either at [phone]) or [phone] (cell)

## 2020-07-30 NOTE — Telephone Encounter (Signed)
Per Dr. Arnoldo Morale ok to leave out. Called number listed and left detailed message on vm.

## 2020-08-20 DIAGNOSIS — I4891 Unspecified atrial fibrillation: Secondary | ICD-10-CM | POA: Diagnosis not present

## 2020-08-20 DIAGNOSIS — J449 Chronic obstructive pulmonary disease, unspecified: Secondary | ICD-10-CM | POA: Diagnosis not present

## 2020-09-01 ENCOUNTER — Other Ambulatory Visit: Payer: Self-pay | Admitting: Student

## 2020-09-01 DIAGNOSIS — I639 Cerebral infarction, unspecified: Secondary | ICD-10-CM

## 2020-09-20 DIAGNOSIS — J449 Chronic obstructive pulmonary disease, unspecified: Secondary | ICD-10-CM | POA: Diagnosis not present

## 2020-09-20 DIAGNOSIS — I4891 Unspecified atrial fibrillation: Secondary | ICD-10-CM | POA: Diagnosis not present

## 2020-09-23 DIAGNOSIS — M1030 Gout due to renal impairment, unspecified site: Secondary | ICD-10-CM | POA: Diagnosis not present

## 2020-09-23 DIAGNOSIS — N183 Chronic kidney disease, stage 3 unspecified: Secondary | ICD-10-CM | POA: Diagnosis not present

## 2020-09-23 DIAGNOSIS — I480 Paroxysmal atrial fibrillation: Secondary | ICD-10-CM | POA: Diagnosis not present

## 2020-09-23 DIAGNOSIS — Z7901 Long term (current) use of anticoagulants: Secondary | ICD-10-CM | POA: Diagnosis not present

## 2020-09-23 DIAGNOSIS — I69354 Hemiplegia and hemiparesis following cerebral infarction affecting left non-dominant side: Secondary | ICD-10-CM | POA: Diagnosis not present

## 2020-09-23 DIAGNOSIS — I5042 Chronic combined systolic (congestive) and diastolic (congestive) heart failure: Secondary | ICD-10-CM | POA: Diagnosis not present

## 2020-10-20 DIAGNOSIS — J449 Chronic obstructive pulmonary disease, unspecified: Secondary | ICD-10-CM | POA: Diagnosis not present

## 2020-10-20 DIAGNOSIS — I4891 Unspecified atrial fibrillation: Secondary | ICD-10-CM | POA: Diagnosis not present

## 2020-10-28 ENCOUNTER — Ambulatory Visit: Payer: Medicare PPO

## 2020-10-29 ENCOUNTER — Ambulatory Visit: Payer: Medicare PPO | Attending: Internal Medicine

## 2020-10-29 ENCOUNTER — Other Ambulatory Visit (HOSPITAL_BASED_OUTPATIENT_CLINIC_OR_DEPARTMENT_OTHER): Payer: Self-pay

## 2020-10-29 DIAGNOSIS — Z23 Encounter for immunization: Secondary | ICD-10-CM

## 2020-10-29 MED ORDER — PFIZER-BIONT COVID-19 VAC-TRIS 30 MCG/0.3ML IM SUSP
INTRAMUSCULAR | 0 refills | Status: DC
Start: 2020-10-29 — End: 2021-11-28
  Filled 2020-10-29: qty 0.3, 1d supply, fill #0

## 2020-10-29 NOTE — Progress Notes (Signed)
   Covid-19 Vaccination Clinic  Name:  Brandy [name]    MRN: [id number] DOB: [birth year]  10/29/2020  [name] was observed post Covid-19 immunization for 15 minutes without incident. She was provided with Vaccine Information Sheet and instruction to access the V-Safe system.   [name] was instructed to call 911 with any severe reactions post vaccine: Difficulty breathing  Swelling of face and throat  A fast heartbeat  A bad rash all over body  Dizziness and weakness   Immunizations Administered     Name Date Dose VIS Date Route   PFIZER Comrnaty(Gray TOP) Covid-19 Vaccine 10/29/2020 11:18 AM 0.3 mL 03/06/2020 Intramuscular   Manufacturer: [employer]   Lot: [id number]   [place]: [phone]

## 2020-10-31 ENCOUNTER — Ambulatory Visit: Payer: Medicare PPO

## 2020-11-20 DIAGNOSIS — I4891 Unspecified atrial fibrillation: Secondary | ICD-10-CM | POA: Diagnosis not present

## 2020-11-20 DIAGNOSIS — J449 Chronic obstructive pulmonary disease, unspecified: Secondary | ICD-10-CM | POA: Diagnosis not present

## 2020-12-04 ENCOUNTER — Encounter (HOSPITAL_BASED_OUTPATIENT_CLINIC_OR_DEPARTMENT_OTHER): Payer: Self-pay

## 2020-12-21 DIAGNOSIS — J449 Chronic obstructive pulmonary disease, unspecified: Secondary | ICD-10-CM | POA: Diagnosis not present

## 2020-12-21 DIAGNOSIS — I4891 Unspecified atrial fibrillation: Secondary | ICD-10-CM | POA: Diagnosis not present

## 2021-01-06 ENCOUNTER — Other Ambulatory Visit (HOSPITAL_BASED_OUTPATIENT_CLINIC_OR_DEPARTMENT_OTHER): Payer: Self-pay | Admitting: Cardiovascular Disease

## 2021-01-06 ENCOUNTER — Telehealth: Payer: Self-pay | Admitting: Cardiovascular Disease

## 2021-01-06 ENCOUNTER — Other Ambulatory Visit (HOSPITAL_BASED_OUTPATIENT_CLINIC_OR_DEPARTMENT_OTHER): Payer: Self-pay | Admitting: *Deleted

## 2021-01-06 MED ORDER — FUROSEMIDE 20 MG PO TABS
20.0000 mg | ORAL_TABLET | Freq: Every day | ORAL | 1 refills | Status: DC
Start: 2021-01-06 — End: 2021-03-16

## 2021-01-06 NOTE — Telephone Encounter (Signed)
Rx(s) sent to pharmacy electronically.  

## 2021-01-06 NOTE — Telephone Encounter (Signed)
Pt husband called in and made an appt with Dr Oval Linsey for her 1st available in Dec.  They would liek to know if there can be enough of this med called in to make to Dec appt?    Best number [phone] Cell [phone]

## 2021-01-20 DIAGNOSIS — I4891 Unspecified atrial fibrillation: Secondary | ICD-10-CM | POA: Diagnosis not present

## 2021-01-20 DIAGNOSIS — J449 Chronic obstructive pulmonary disease, unspecified: Secondary | ICD-10-CM | POA: Diagnosis not present

## 2021-01-26 DIAGNOSIS — Z9989 Dependence on other enabling machines and devices: Secondary | ICD-10-CM | POA: Diagnosis not present

## 2021-01-26 DIAGNOSIS — Z0000 Encounter for general adult medical examination without abnormal findings: Secondary | ICD-10-CM | POA: Diagnosis not present

## 2021-01-26 DIAGNOSIS — Z23 Encounter for immunization: Secondary | ICD-10-CM | POA: Diagnosis not present

## 2021-02-27 ENCOUNTER — Other Ambulatory Visit: Payer: Self-pay | Admitting: Cardiovascular Disease

## 2021-03-05 ENCOUNTER — Ambulatory Visit (HOSPITAL_BASED_OUTPATIENT_CLINIC_OR_DEPARTMENT_OTHER): Payer: Medicare PPO | Admitting: Cardiovascular Disease

## 2021-03-05 ENCOUNTER — Other Ambulatory Visit: Payer: Self-pay

## 2021-03-05 ENCOUNTER — Encounter (HOSPITAL_BASED_OUTPATIENT_CLINIC_OR_DEPARTMENT_OTHER): Payer: Self-pay | Admitting: Cardiovascular Disease

## 2021-03-05 VITALS — BP 116/70 | HR 64 | Ht 64.0 in | Wt 126.0 lb

## 2021-03-05 DIAGNOSIS — I10 Essential (primary) hypertension: Secondary | ICD-10-CM

## 2021-03-05 DIAGNOSIS — Z5181 Encounter for therapeutic drug level monitoring: Secondary | ICD-10-CM | POA: Diagnosis not present

## 2021-03-05 DIAGNOSIS — E782 Mixed hyperlipidemia: Secondary | ICD-10-CM | POA: Diagnosis not present

## 2021-03-05 DIAGNOSIS — I63511 Cerebral infarction due to unspecified occlusion or stenosis of right middle cerebral artery: Secondary | ICD-10-CM | POA: Diagnosis not present

## 2021-03-05 DIAGNOSIS — I480 Paroxysmal atrial fibrillation: Secondary | ICD-10-CM | POA: Diagnosis not present

## 2021-03-05 DIAGNOSIS — I5042 Chronic combined systolic (congestive) and diastolic (congestive) heart failure: Secondary | ICD-10-CM

## 2021-03-05 NOTE — Assessment & Plan Note (Addendum)
LVEF was 35 to 40% in 2020.  She is euvolemic and has been doing well.  Continue carvedilol, Imdur, and Lasix.  Blood pressure is too low to add an ARB.  Could consider adding [place].

## 2021-03-05 NOTE — Progress Notes (Signed)
Cardiology Office Note  Date:  03/05/2021   ID:  Shantele, Reller 1940-07-07, MRN [id number]  PCP:  [name], MD  Cardiologist:   Skeet Latch, MD   No chief complaint on file.   History of Present Illness: Brandy [name] is a 80 y.o. female with chronic systolic and diastolic heart failure, hypertension, hyperlipidemia, nonobstructive CAD, paroxysmal atrial fibrillation, prior stroke with left hemiparesis, and COPD on home O2 here for follow-up.  She was previously a patient of Dr. Bronson Ing.  She was hospitalized 09/2018 with hypernatremia.  Her sodium was 151.  She was nonambulatory and nonverbal at the time.  Palliative care was consulted and she had acute renal failure with creatinine of 2.5.  A PEG tube was placed.  Since then she gets most of her food via tube feeds and rarely eats by mouth.  She was last seen by cardiology 04/2019.  Her most recent echo 10/2018 revealed LVEF 35 to 40% with moderate LVH and grade 2 diastolic dysfunction.  Her systolic function had improved from 20 to 25% 03/2018.  She was admitted to the hospital with GI bleed in the setting of Eliquis.  Hospitalization was complicated by community-acquired pneumonia.  She was dound to have erosive gastritis and colonic polpys.  She was instructed to hold Eliquis until 01/26/20.  Ms. Esteve was seen in the ED 01/29/2020 with acute onset of shortness of breath. High-sensitivity troponin was elevated to 207. EKG was without acute ischemic changes. BNP was elevated to 2179. She was given a dose of IV Lasix and instructed to take Lasix 40 mg daily at home.  Follow-up her breathing was much better.  Furosemide was reduced to 20 mg a day.  Since that time she had not had any lower extremity edema. She did start back taking Eliquis as instructed on 10/30 and her CBC was stable. At her last appointment she was doing well. Her feeding tube fell out 07/2020. She was eating well so it was not replaced.  Today, she is accompanied  by 2 family members. Overall she is feeling pretty good, and her weight has been stable. She reports her breathing and appetite are good as well. However, she has an acute cough beginning about a week ago. The cough is non-productive, but they note it sounds like there is sputum present. For treatment she is taking Robitussin-DM. Her vitals are monitored daily, and her blood pressure has remained well-controlled. Sometimes her oxygen saturation is low, but improves after oxygen is given. She denies any palpitations, or chest pain. No lightheadedness, headaches, syncope, orthopnea, PND, lower extremity edema or exertional symptoms.  Past Medical History:  Diagnosis Date   Cardiomyopathy    a. EF of 25% in 07/2006 b. EF normalized by repeat echo in 2011 c. EF 35-40% by echo in 05/2017 with cath showing mild nonobstructive CAD   Chronic combined systolic and diastolic CHF (congestive heart failure) (HCC)    Chronic combined systolic and diastolic heart failure (HCC)    Colon polyps    Diverticulosis of colon    Hypertension    Hypertensive heart disease    Osteopenia    Paroxysmal atrial fibrillation ([place]) 10/10/2018   Stroke ([place])    TIA (transient ischemic attack)    Tobacco abuse    50 pack year    Past Surgical History:  Procedure Laterality Date   BREAST BIOPSY     CARPAL TUNNEL RELEASE     right hand   CERVICAL FUSION  CESAREAN SECTION     2 times   COLONOSCOPY  5427   pt uncertain as to whether polypectomy required or performed   COLONOSCOPY WITH PROPOFOL N/A 01/23/2020   Procedure: COLONOSCOPY WITH PROPOFOL;  Surgeon: Rogene Houston, MD;  [facility];  Service: Endoscopy;  Laterality: N/A;   ECTOPIC PREGNANCY SURGERY     ESOPHAGOGASTRODUODENOSCOPY (EGD) WITH PROPOFOL N/A 10/13/2018   Procedure: ESOPHAGOGASTRODUODENOSCOPY (EGD) WITH PROPOFOL;  Surgeon: Aviva Signs, MD;  Location: [facility];  Service: General;  Laterality: N/A;   ESOPHAGOGASTRODUODENOSCOPY (EGD)  WITH PROPOFOL N/A 01/23/2020   Procedure: ESOPHAGOGASTRODUODENOSCOPY (EGD) WITH PROPOFOL;  Surgeon: Rogene Houston, MD;  [facility];  Service: Endoscopy;  Laterality: N/A;   IR ANGIO VERTEBRAL SEL SUBCLAVIAN INNOMINATE UNI R MOD SED  02/10/2018   IR CT HEAD LTD  02/10/2018   IR PERCUTANEOUS ART THROMBECTOMY/INFUSION INTRACRANIAL INC DIAG ANGIO  02/10/2018   PEG PLACEMENT N/A 10/13/2018   Procedure: PERCUTANEOUS ENDOSCOPIC GASTROSTOMY (PEG) PLACEMENT;  Surgeon: Aviva Signs, MD;  Location: [facility];  Service: General;  Laterality: N/A;   PEG PLACEMENT N/A 12/04/2019   Procedure: PERCUTANEOUS ENDOSCOPIC GASTROSTOMY (PEG) REPLACEMENT;  Surgeon: Aviva Signs, MD;  [facility];  Service: Gastroenterology;  Laterality: N/A;   PEG PLACEMENT N/A 12/12/2019   Procedure: PERCUTANEOUS ENDOSCOPIC GASTROSTOMY (PEG) REPLACEMENT;  Surgeon: Aviva Signs, MD;  [facility];  Service: Gastroenterology;  Laterality: N/A;   POLYPECTOMY  01/23/2020   Procedure: POLYPECTOMY;  Surgeon: Rogene Houston, MD;  [facility];  Service: Endoscopy;;   RADIOLOGY WITH ANESTHESIA N/A 02/10/2018   Procedure: RADIOLOGY WITH ANESTHESIA;  Surgeon: Luanne Bras, MD;  Location: [place];  Service: Radiology;  Laterality: N/A;   RIGHT/LEFT HEART CATH AND CORONARY ANGIOGRAPHY N/A 06/03/2017   Procedure: RIGHT/LEFT HEART CATH AND CORONARY ANGIOGRAPHY;  Surgeon: Jettie Booze, MD;  Location: [facility];  Service: Cardiovascular;  Laterality: N/A;   TEE WITHOUT CARDIOVERSION N/A 02/13/2018   Procedure: TRANSESOPHAGEAL ECHOCARDIOGRAM (TEE);  Surgeon: Sueanne Margarita, MD;  Location: [facility];  Service: Cardiovascular;  Laterality: N/A;   TONSILLECTOMY       Current Outpatient Medications  Medication Sig Dispense Refill   acetaminophen (TYLENOL) 325 MG tablet Take 2 tablets (650 mg total) by mouth every 4 (four) hours as needed for mild pain (or temp > 37.5 C (99.5 F)).  (Patient taking differently: Take 650 mg by mouth every 6 (six) hours as needed for mild pain or moderate pain.)     atorvastatin (LIPITOR) 10 MG tablet TAKE 1 TABLET BY MOUTH EVERY DAY AT 6 PM 90 tablet 2   Budeson-Glycopyrrol-Formoterol (BREZTRI AEROSPHERE) 160-9-4.8 MCG/ACT AERO Inhale 2 Inhalers into the lungs in the morning and at bedtime.     carvedilol (COREG) 3.125 MG tablet TAKE 1 TABLET(3.125 MG) BY MOUTH TWICE DAILY WITH A MEAL 180 tablet 3   cetirizine (ZYRTEC) 10 MG tablet Take 10 mg by mouth daily.     citalopram (CELEXA) 10 MG tablet Take 10 mg by mouth daily.     COVID-19 mRNA Vac-TriS, Pfizer, (PFIZER-BIONT COVID-19 VAC-TRIS) SUSP injection Inject into the muscle. 0.3 mL 0   donepezil (ARICEPT) 5 MG tablet Take 1 tablet by mouth at bedtime.  1   ELIQUIS 2.5 MG TABS tablet Take 1 tablet (2.5 mg total) by mouth 2 (two) times daily. 60 tablet    ferrous sulfate 324 MG TBEC Take 324 mg by mouth. M/W/F  furosemide (LASIX) 20 MG tablet Take 1 tablet (20 mg total) by mouth daily. 30 tablet 1   isosorbide mononitrate (IMDUR) 30 MG 24 hr tablet TAKE 1/2 TABLET(15 MG) BY MOUTH DAILY 45 tablet 2   Melatonin 5 MG TABS Take by mouth.     Multiple Vitamin (MULTIVITAMIN) LIQD Take 15 mLs by mouth daily. 870 mL 99   pantoprazole (PROTONIX) 40 MG tablet Take 1 tablet (40 mg total) by mouth daily before breakfast. 30 tablet 1   No current facility-administered medications for this visit.   Facility-Administered Medications Ordered in Other Visits  Medication Dose Route Frequency Provider Last Rate Last Admin   iohexol (OMNIPAQUE) 300 MG/ML solution 50 mL  50 mL Per Tube Once PRN Long, Wonda Olds, MD        Allergies:   Lisinopril    Social History:  The patient  reports that she quit smoking about 12 years ago. Her smoking use included cigarettes. She started smoking about 60 years ago. She has a 50.00 pack-year smoking history. She has never used smokeless tobacco. She reports that she  does not drink alcohol and does not use drugs.   Family History:  The patient's family history includes Cancer in her paternal grandmother; Hypertension in her brother, father, maternal grandmother, and mother; Stroke in her maternal grandmother.    ROS:  Please see the history of present illness. (+) Cough All other systems are reviewed and negative.    PHYSICAL EXAM: VS:  BP 116/70 (BP Location: Right Arm, Patient Position: Sitting, Cuff Size: Normal)   Pulse 64   Ht 5\' 4"  (1.626 m)   Wt 126 lb (57.2 kg)   SpO2 97%   BMI 21.63 kg/m  , BMI Body mass index is 21.63 kg/m. GENERAL:  Frail.  Well-appearing.  Wheelchair bound. HEENT:  Pupils equal round and reactive, fundi not visualized, oral mucosa unremarkable NECK:  No jugular venous distention, waveform within normal limits, carotid upstroke brisk and symmetric, no bruits LUNGS:  Clear to auscultation bilaterally HEART:  RRR.  PMI not displaced or sustained,S1 and S2 within normal limits, no S3, no S4, no clicks, no rubs, no murmurs ABD:  Flat, positive bowel sounds normal in frequency in pitch, no bruits, no rebound, no guarding, no midline pulsatile mass, no hepatomegaly, no splenomegaly EXT:  2 plus pulses throughout, no edema, no cyanosis no clubbing SKIN:  No rashes no nodules NEURO: L hemiparesis PSYCH:  AOx3.  Answers questions appropriately.  EKG:   03/05/21: EKG was not ordered. 05/07/20: Sinus bradycardia.  Rate 56 bpm. LAFB.  Prior septal infarct. 02/01/20: sinus rhythm. Rate 87 bpm. LAFB. Prior anteroseptal infarct.  Echo Limited 11/06/2018:  1. The left ventricle has moderately reduced systolic function, with an  ejection fraction of 35-40%. The cavity size was normal. There is moderate  concentric left ventricular hypertrophy. Left ventricular diastolic  Doppler parameters are consistent with   pseudonormal. Left ventricular diffuse hypokinesis.   2. Left atrial size was mildly dilated.   3. The mitral valve is  grossly normal.   4. The tricuspid valve was grossly normal. Tricuspid valve regurgitation  was not assessed by color flow Doppler.   5. The aortic valve is tricuspid.   6. The aortic root is normal in size and structure.   7. The interatrial septum was not assessed.   Echo 03/30/2018: Study Conclusions  - Left ventricle: The cavity size was mildly dilated. Wall    thickness was increased in a pattern  of moderate LVH. Systolic    function was severely reduced. The estimated ejection fraction    was in the range of 20% to 25%. Diffuse hypokinesis. Features are    consistent with a pseudonormal left ventricular filling pattern,    with concomitant abnormal relaxation and increased filling    pressure (grade 2 diastolic dysfunction). Doppler parameters are    consistent with high ventricular filling pressure.  - Mitral valve: Calcified annulus. Mildly thickened leaflets .    There was mild to moderate regurgitation.  - Left atrium: The atrium was mildly dilated.  - Pulmonary arteries: Systolic pressure was moderately increased.    PA peak pressure: 49 mm Hg (S).   Impressions:  - Severe global reduction in LV systolic function; moderate    diastolic dysfunction with elevated LV filling pressure; moderate    LVH; mild LVE; mild to moderate MR; mild LAE; mild TR with    moderate pulmonary hypertension.   R/L Heart Cath 06/03/2017: Ost 2nd Diag to 2nd Diag lesion is 25% stenosed. Mid RCA lesion is 25% stenosed. LV end diastolic pressure is moderately elevated. There is no aortic valve stenosis. LV end diastolic pressure is normal. Hemodynamic findings consistent with mild pulmonary hypertension. CO 5.2 L/min; CI 2.86; Ao 94%; PA sat 65%, mean PA 29 mm Hg; PCWP 20 mm Hg   Mild, nonobstructive CAD.    Mildly elevated right heart pressures with volume overload.    Continue medical therapy.   EF not assessed to minimize contrast exposure.  Diagnostic Dominance: Right   Recent  Labs: No results found for requested labs within last 8760 hours.   Lipid Panel    Component Value Date/Time   CHOL 93 02/11/2018 0447   TRIG 42 02/11/2018 0447   HDL 33 (L) 02/11/2018 0447   CHOLHDL 2.8 02/11/2018 0447   VLDL 8 02/11/2018 0447   LDLCALC 52 02/11/2018 0447    Wt Readings from Last 3 Encounters:  03/05/21 126 lb (57.2 kg)  05/07/20 126 lb (57.2 kg)  01/23/20 129 lb 3 oz (58.6 kg)     ASSESSMENT AND PLAN:  Chronic combined systolic and diastolic heart failure (HCC) LVEF was 35 to 40% in 2020.  She is euvolemic and has been doing well.  Continue carvedilol, Imdur, and Lasix.  Blood pressure is too low to add an ARB.  Could consider adding [place].  PAF (paroxysmal atrial fibrillation) (HCC) Currently in sinus rhythm.  Continue carvedilol and Eliquis.  Acute right MCA stroke [facility]) She has residual left-sided hemiplegia.  Continue Eliquis for atrial fibrillation.  Blood pressure is well-controlled.  HTN (hypertension) Blood pressure well-controlled on carvedilol and Imdur.    Current medicines are reviewed at length with the patient today.  The patient does not have concerns regarding medicines.  The following changes have been made:  Continue lasix 20mg  daily  Labs/ tests ordered today include:   Orders Placed This Encounter  Procedures   Lipid panel   Comprehensive metabolic panel     Disposition:   FU with Dehaven Sine C. Oval Linsey, MD, [facility] in 6 months   I,Mathew Stumpf,acting as a Education administrator for Skeet Latch, MD.,have documented all relevant documentation on the behalf of Skeet Latch, MD,as directed by  Skeet Latch, MD while in the presence of Skeet Latch, MD.  I, Brandon Oval Linsey, MD have reviewed all documentation for this visit.  The documentation of the exam, diagnosis, procedures, and orders on 03/05/2021 are all accurate and complete.   Signed, Diamonds Lippard  Joya Gaskins, MD, [facility]  03/05/2021 11:40 AM    [place]

## 2021-03-05 NOTE — Assessment & Plan Note (Addendum)
Currently in sinus rhythm.  Continue carvedilol and Eliquis.  She has a history of GI bleeding.  We will check a CBC.

## 2021-03-05 NOTE — Patient Instructions (Signed)
Medication Instructions:  Your physician recommends that you continue on your current medications as directed. Please refer to the Current Medication list given to you today.   *If you need a refill on your cardiac medications before your next appointment, please call your pharmacy*  Lab Work: FASTING LP/CMET SOON   If you have labs (blood work) drawn today and your tests are completely normal, you will receive your results only by: MyChart Message (if you have MyChart) OR A paper copy in the mail If you have any lab test that is abnormal or we need to change your treatment, we will call you to review the results.  Testing/Procedures: NONE  Follow-Up: At [facility], you and your health needs are our priority.  As part of our continuing mission to provide you with exceptional heart care, we have created designated Provider Care Teams.  These Care Teams include your primary Cardiologist (physician) and Advanced Practice Providers (APPs -  Physician Assistants and Nurse Practitioners) who all work together to provide you with the care you need, when you need it.  We recommend signing up for the patient portal called "MyChart".  Sign up information is provided on this After Visit Summary.  MyChart is used to connect with patients for Virtual Visits (Telemedicine).  Patients are able to view lab/test results, encounter notes, upcoming appointments, etc.  Non-urgent messages can be sent to your provider as well.   To learn more about what you can do with MyChart, go to [email or web address].    Your next appointment:   6 month(s)  The format for your next appointment:   In Person  Provider:   Tiffany Gregory, MD{          

## 2021-03-05 NOTE — Assessment & Plan Note (Signed)
She has residual left-sided hemiplegia.  Continue Eliquis for atrial fibrillation.  Blood pressure is well-controlled.

## 2021-03-05 NOTE — Addendum Note (Signed)
Addended by: Alvina Filbert B on: 03/05/2021 01:53 PM   Modules accepted: Orders

## 2021-03-05 NOTE — Assessment & Plan Note (Signed)
Blood pressure well-controlled on carvedilol and Imdur.

## 2021-03-14 ENCOUNTER — Other Ambulatory Visit (HOSPITAL_BASED_OUTPATIENT_CLINIC_OR_DEPARTMENT_OTHER): Payer: Self-pay | Admitting: Cardiovascular Disease

## 2021-03-16 NOTE — Telephone Encounter (Signed)
Rx(s) sent to pharmacy electronically.  

## 2021-04-15 DIAGNOSIS — F0390 Unspecified dementia without behavioral disturbance: Secondary | ICD-10-CM | POA: Diagnosis not present

## 2021-04-15 DIAGNOSIS — D6869 Other thrombophilia: Secondary | ICD-10-CM | POA: Diagnosis not present

## 2021-04-15 DIAGNOSIS — I10 Essential (primary) hypertension: Secondary | ICD-10-CM | POA: Diagnosis not present

## 2021-04-15 DIAGNOSIS — R32 Unspecified urinary incontinence: Secondary | ICD-10-CM | POA: Diagnosis not present

## 2021-04-15 DIAGNOSIS — I4891 Unspecified atrial fibrillation: Secondary | ICD-10-CM | POA: Diagnosis not present

## 2021-04-15 DIAGNOSIS — I2510 Atherosclerotic heart disease of native coronary artery without angina pectoris: Secondary | ICD-10-CM | POA: Diagnosis not present

## 2021-04-15 DIAGNOSIS — F325 Major depressive disorder, single episode, in full remission: Secondary | ICD-10-CM | POA: Diagnosis not present

## 2021-04-15 DIAGNOSIS — E785 Hyperlipidemia, unspecified: Secondary | ICD-10-CM | POA: Diagnosis not present

## 2021-04-15 DIAGNOSIS — I69354 Hemiplegia and hemiparesis following cerebral infarction affecting left non-dominant side: Secondary | ICD-10-CM | POA: Diagnosis not present

## 2021-04-28 IMAGING — DX DG ABDOMEN 1V
1 series · 1 of 1 positions shown · non-contrast
Comparison: None.

CLINICAL DATA: Replaced PEG tube this morning. Tube fell out of the
incision this afternoon.

EXAM:
ABDOMEN - 1 VIEW

[abdomen supine grid]
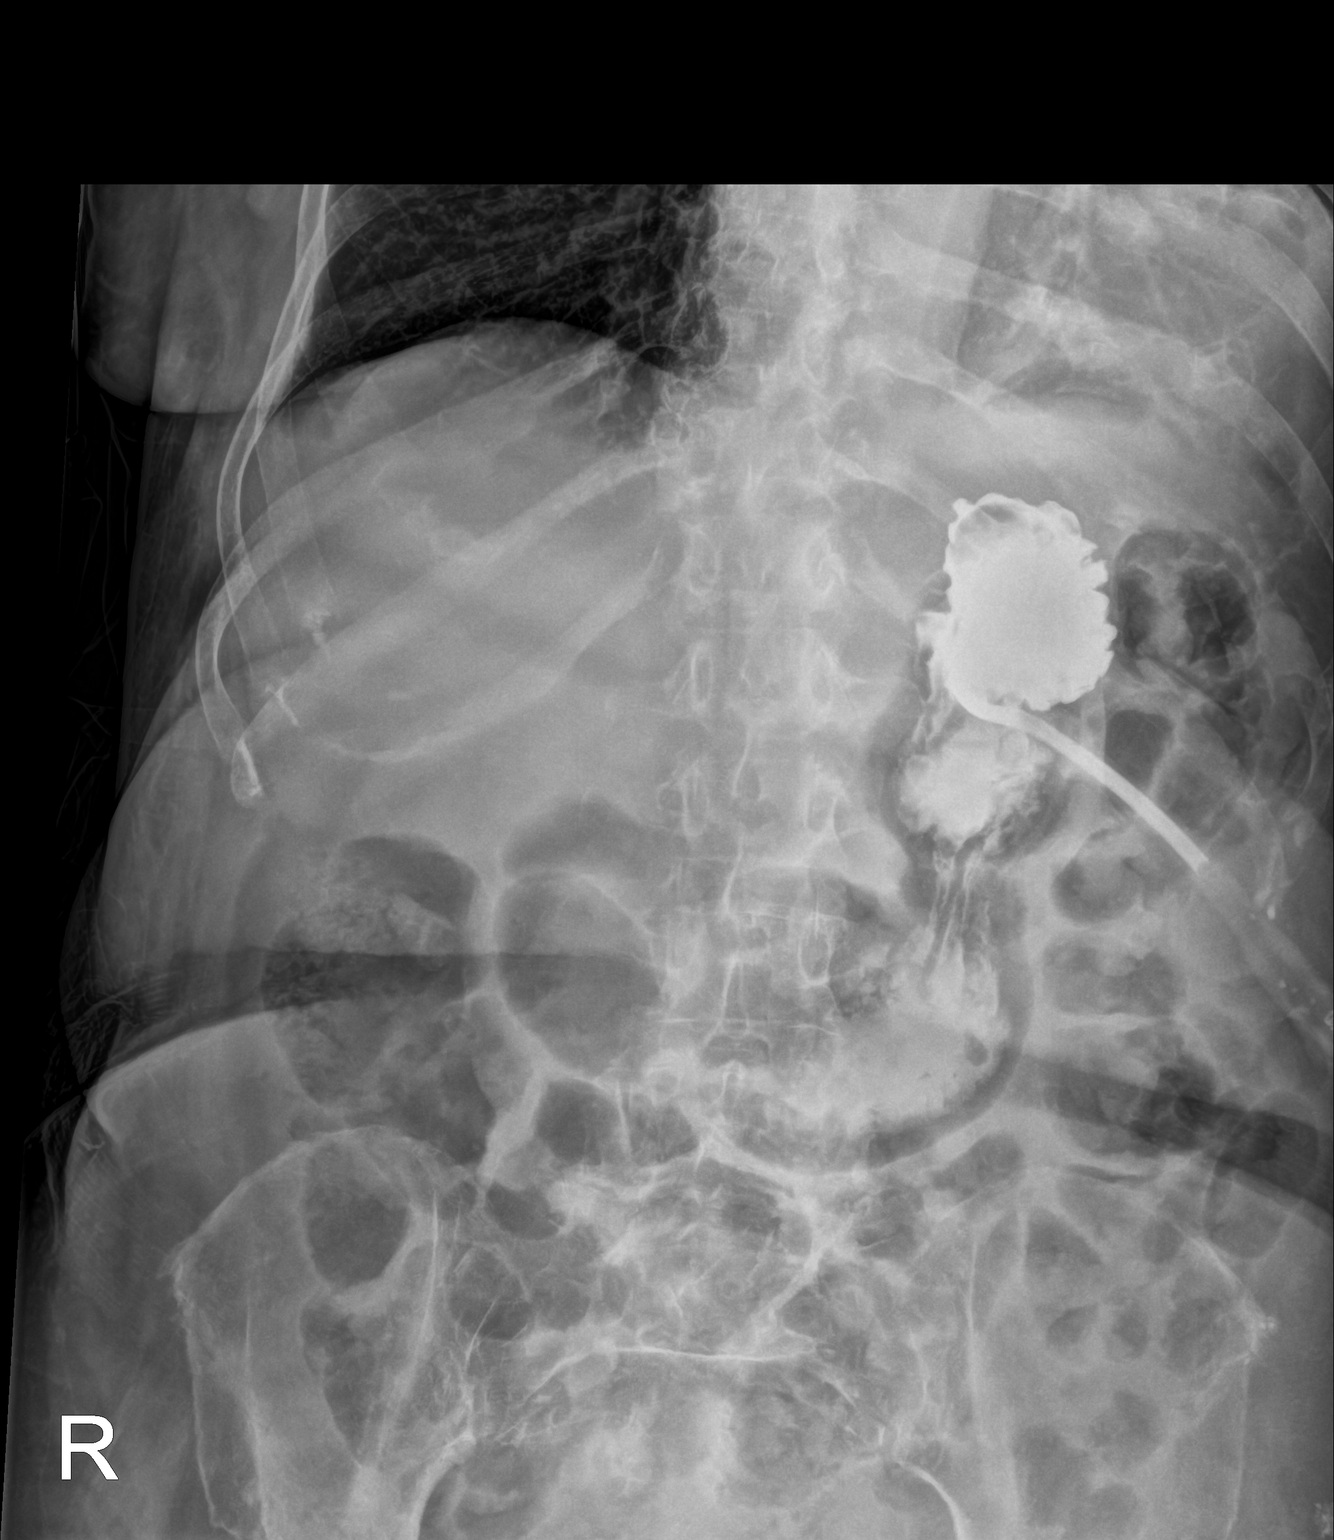

[1 of 1 positions shown; findings below may reference images not displayed]

FINDINGS: 50 mL Omnipaque 300 was injected into the gastrostomy tube.
Subsequent images obtained demonstrate gastrostomy tube with
retention balloon localized in the upper stomach. Contrast material
is demonstrated within the stomach suggesting patency of the tube
with good position. No contrast extravasation is demonstrated.
Visualized bowel gas pattern is normal without small bowel or large
bowel distention.
IMPRESSION: Gastrostomy tube appears to be in good position. No contrast
extravasation is demonstrated.

## 2021-04-30 ENCOUNTER — Other Ambulatory Visit: Payer: Self-pay | Admitting: Physician Assistant

## 2021-05-21 DIAGNOSIS — I10 Essential (primary) hypertension: Secondary | ICD-10-CM | POA: Diagnosis not present

## 2021-05-21 DIAGNOSIS — I480 Paroxysmal atrial fibrillation: Secondary | ICD-10-CM | POA: Diagnosis not present

## 2021-05-21 DIAGNOSIS — Z5181 Encounter for therapeutic drug level monitoring: Secondary | ICD-10-CM | POA: Diagnosis not present

## 2021-05-21 DIAGNOSIS — E782 Mixed hyperlipidemia: Secondary | ICD-10-CM | POA: Diagnosis not present

## 2021-05-21 LAB — COMPREHENSIVE METABOLIC PANEL
ALT: 13 IU/L (ref 0–32)
AST: 20 IU/L (ref 0–40)
Albumin/Globulin Ratio: 1.4 (ref 1.2–2.2)
Albumin: 3.7 g/dL (ref 3.7–4.7)
Alkaline Phosphatase: 84 IU/L (ref 44–121)
BUN/Creatinine Ratio: 19 (ref 12–28)
BUN: 28 mg/dL — ABNORMAL HIGH (ref 8–27)
Bilirubin Total: 0.4 mg/dL (ref 0.0–1.2)
CO2: 25 mmol/L (ref 20–29)
Calcium: 9.5 mg/dL (ref 8.7–10.3)
Chloride: 104 mmol/L (ref 96–106)
Creatinine, Ser: 1.51 mg/dL — ABNORMAL HIGH (ref 0.57–1.00)
Globulin, Total: 2.6 g/dL (ref 1.5–4.5)
Glucose: 79 mg/dL (ref 70–99)
Potassium: 4.2 mmol/L (ref 3.5–5.2)
Sodium: 143 mmol/L (ref 134–144)
Total Protein: 6.3 g/dL (ref 6.0–8.5)
eGFR: 35 mL/min/{1.73_m2} — ABNORMAL LOW (ref >59)

## 2021-05-21 LAB — CBC WITH DIFFERENTIAL/PLATELET
Basophils Absolute: 0.1 10*3/uL (ref 0.0–0.2)
Basos: 1 %
EOS (ABSOLUTE): 0.2 10*3/uL (ref 0.0–0.4)
Eos: 5 %
Hematocrit: 36 % (ref 34.0–46.6)
Hemoglobin: 11.7 g/dL (ref 11.1–15.9)
Immature Grans (Abs): 0 10*3/uL (ref 0.0–0.1)
Immature Granulocytes: 0 %
Lymphocytes Absolute: 1.8 10*3/uL (ref 0.7–3.1)
Lymphs: 35 %
MCH: 29.5 pg (ref 26.6–33.0)
MCHC: 32.5 g/dL (ref 31.5–35.7)
MCV: 91 fL (ref 79–97)
Monocytes Absolute: 0.5 10*3/uL (ref 0.1–0.9)
Monocytes: 9 %
Neutrophils Absolute: 2.5 10*3/uL (ref 1.4–7.0)
Neutrophils: 50 %
Platelets: 200 10*3/uL (ref 150–450)
RBC: 3.97 x10E6/uL (ref 3.77–5.28)
RDW: 12.4 % (ref 11.7–15.4)
WBC: 5.1 10*3/uL (ref 3.4–10.8)

## 2021-05-21 LAB — LIPID PANEL
Chol/HDL Ratio: 2.2 ratio (ref 0.0–4.4)
Cholesterol, Total: 93 mg/dL — ABNORMAL LOW (ref 100–199)
HDL: 42 mg/dL (ref >39)
LDL Chol Calc (NIH): 38 mg/dL (ref 0–99)
Triglycerides: 51 mg/dL (ref 0–149)
VLDL Cholesterol Cal: 13 mg/dL (ref 5–40)

## 2021-05-28 ENCOUNTER — Telehealth (HOSPITAL_BASED_OUTPATIENT_CLINIC_OR_DEPARTMENT_OTHER): Payer: Self-pay | Admitting: Cardiovascular Disease

## 2021-05-28 DIAGNOSIS — I639 Cerebral infarction, unspecified: Secondary | ICD-10-CM

## 2021-05-28 MED ORDER — ATORVASTATIN CALCIUM 10 MG PO TABS: 10.0000 mg | ORAL_TABLET | Freq: Every day | ORAL | 1 refills | Status: DC

## 2021-05-28 NOTE — Telephone Encounter (Signed)
Received fax from [facility] on [address] in [place] requesting refills for Atorvastatin 10 mg QD. Rx request sent to pharmacy. ? ?

## 2021-06-01 DIAGNOSIS — I480 Paroxysmal atrial fibrillation: Secondary | ICD-10-CM | POA: Diagnosis not present

## 2021-06-01 DIAGNOSIS — I5042 Chronic combined systolic (congestive) and diastolic (congestive) heart failure: Secondary | ICD-10-CM | POA: Diagnosis not present

## 2021-06-01 DIAGNOSIS — J449 Chronic obstructive pulmonary disease, unspecified: Secondary | ICD-10-CM | POA: Diagnosis not present

## 2021-06-01 DIAGNOSIS — I69354 Hemiplegia and hemiparesis following cerebral infarction affecting left non-dominant side: Secondary | ICD-10-CM | POA: Diagnosis not present

## 2021-06-01 DIAGNOSIS — N183 Chronic kidney disease, stage 3 unspecified: Secondary | ICD-10-CM | POA: Diagnosis not present

## 2021-06-22 IMAGING — CR DG CHEST 2V
2 series · 2 of 2 positions shown · non-contrast
Comparison: Prior chest radiograph 01/21/2020.

CLINICAL DATA: Shortness of breath.

EXAM:
CHEST - 2 VIEW

[chest lat]
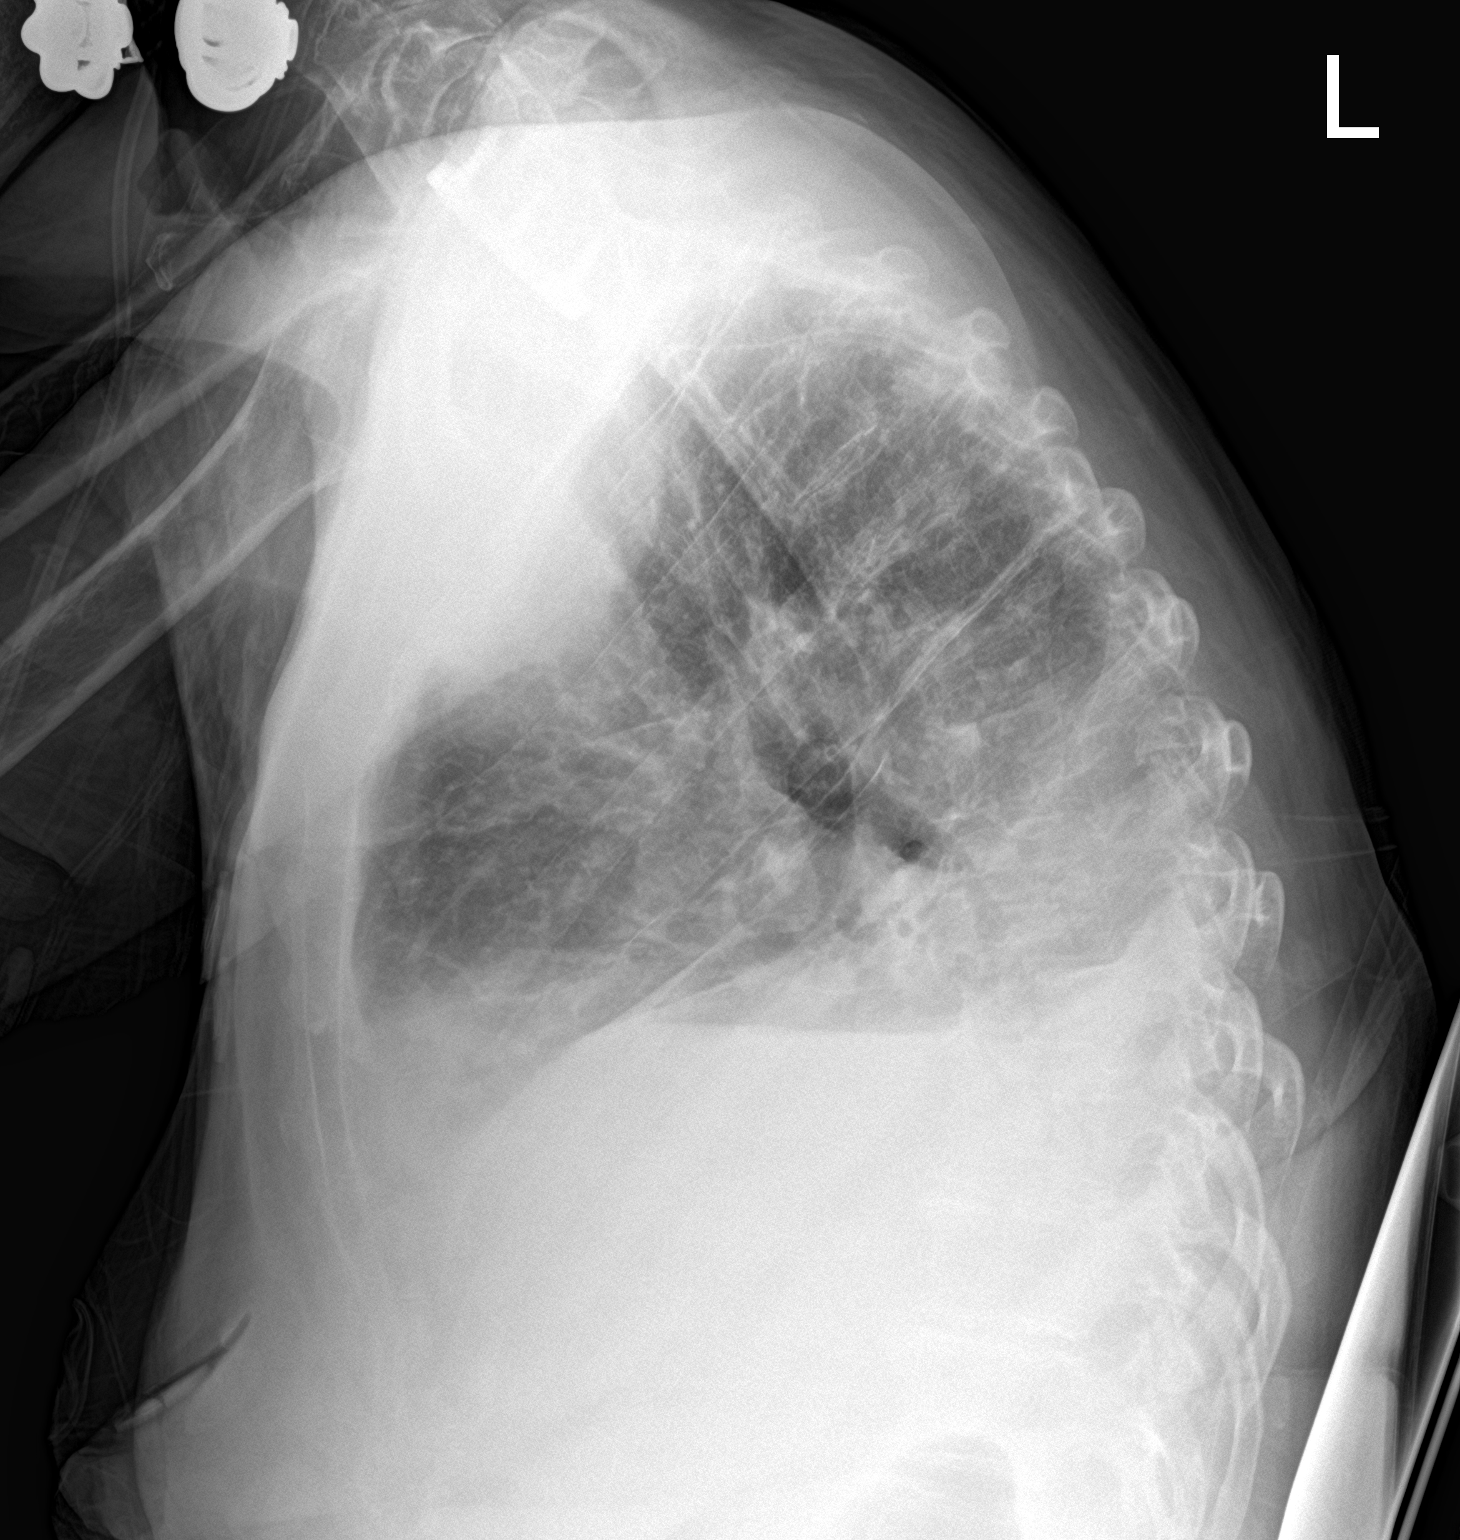

[chest ap]
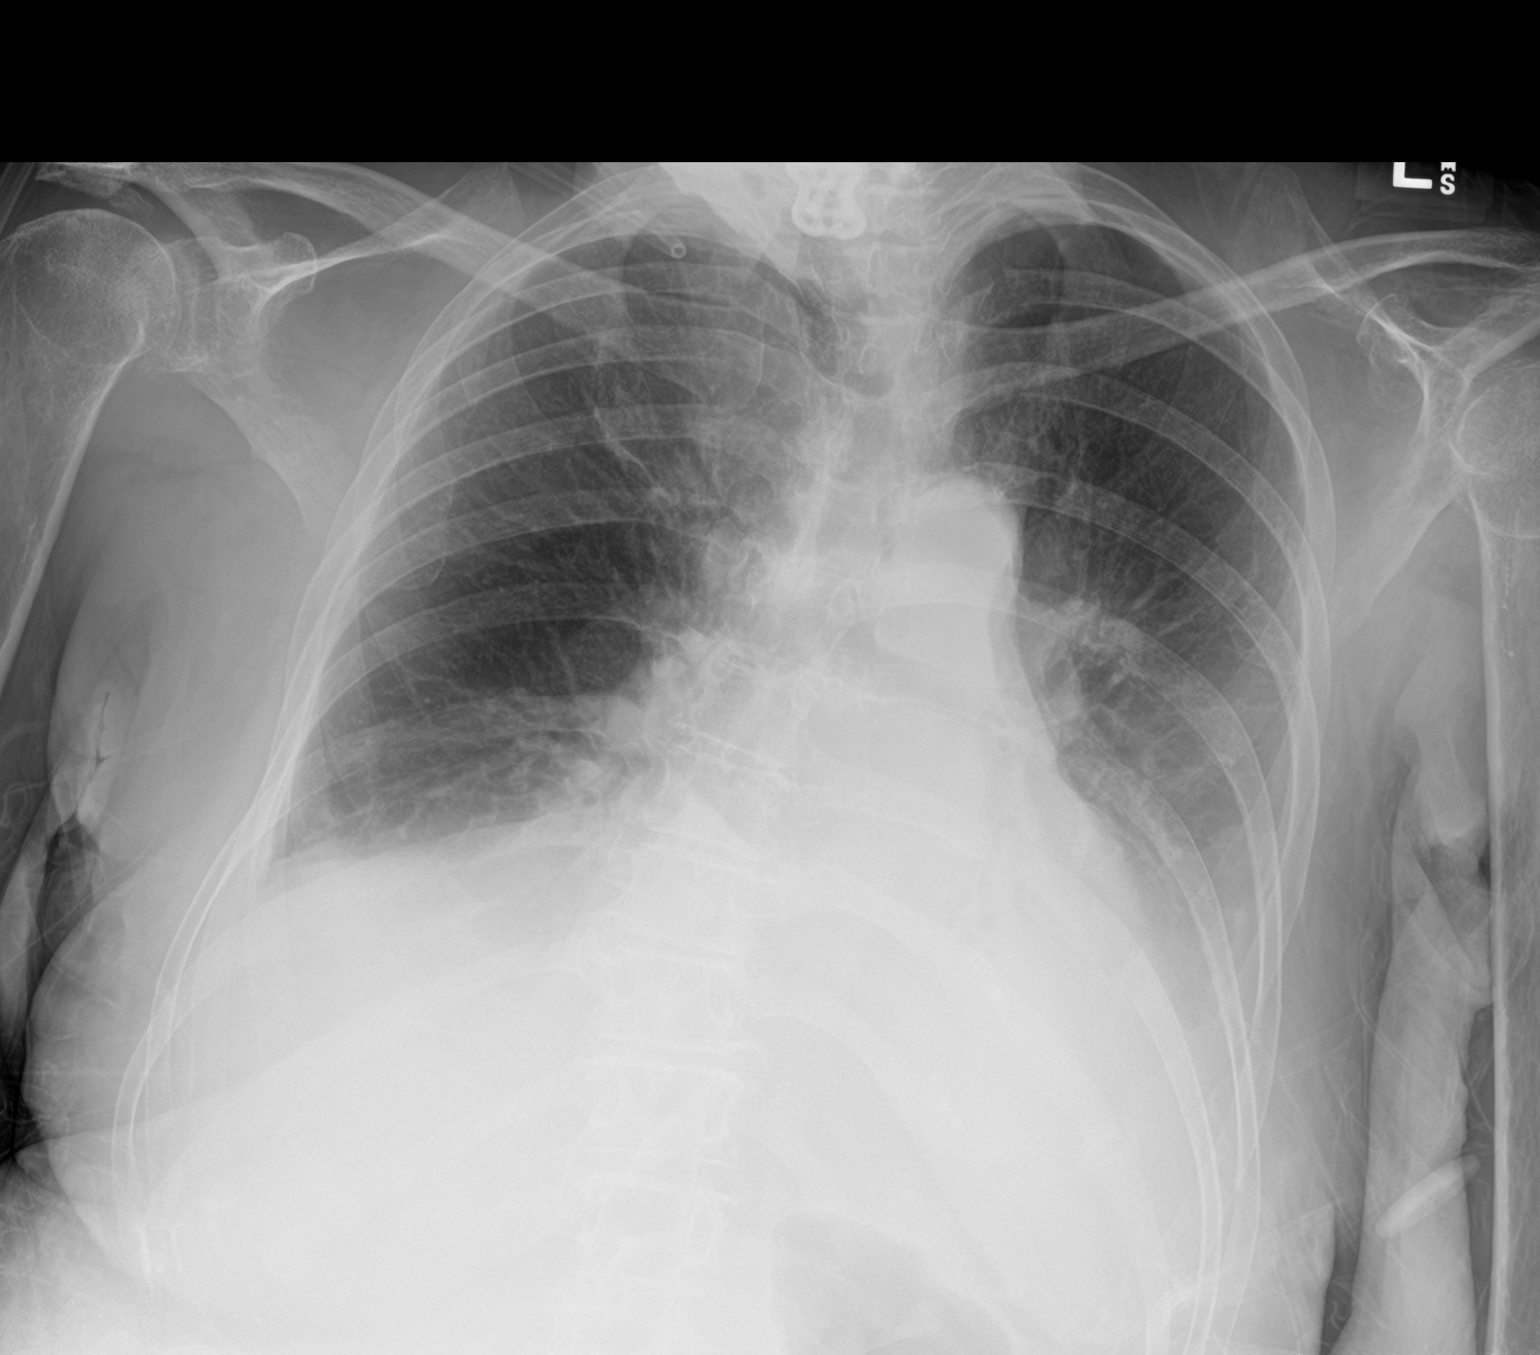

[2 of 2 positions shown; findings below may reference images not displayed]

FINDINGS: Unchanged cardiomegaly. Small to moderate left and small right
pleural effusions. Associated left greater than right bibasilar
atelectasis and/or consolidation. No evidence of pneumothorax. No
acute bony abnormality identified. ACDF hardware.
IMPRESSION: Small to moderate left and small right pleural effusions. Associated
left greater than right bibasilar atelectasis and or consolidation.

Unchanged cardiomegaly.

## 2021-09-10 ENCOUNTER — Ambulatory Visit (HOSPITAL_BASED_OUTPATIENT_CLINIC_OR_DEPARTMENT_OTHER): Payer: Medicare PPO | Admitting: Cardiovascular Disease

## 2021-09-10 ENCOUNTER — Encounter (HOSPITAL_BASED_OUTPATIENT_CLINIC_OR_DEPARTMENT_OTHER): Payer: Self-pay | Admitting: Cardiovascular Disease

## 2021-09-10 VITALS — BP 118/80 | HR 104 | Ht 64.0 in

## 2021-09-10 DIAGNOSIS — I5042 Chronic combined systolic (congestive) and diastolic (congestive) heart failure: Secondary | ICD-10-CM | POA: Diagnosis not present

## 2021-09-10 DIAGNOSIS — I480 Paroxysmal atrial fibrillation: Secondary | ICD-10-CM

## 2021-09-10 DIAGNOSIS — I10 Essential (primary) hypertension: Secondary | ICD-10-CM

## 2021-09-10 DIAGNOSIS — R109 Unspecified abdominal pain: Secondary | ICD-10-CM

## 2021-09-10 DIAGNOSIS — E782 Mixed hyperlipidemia: Secondary | ICD-10-CM

## 2021-09-10 DIAGNOSIS — Z5181 Encounter for therapeutic drug level monitoring: Secondary | ICD-10-CM | POA: Diagnosis not present

## 2021-09-10 DIAGNOSIS — N1832 Chronic kidney disease, stage 3b: Secondary | ICD-10-CM | POA: Diagnosis not present

## 2021-09-10 DIAGNOSIS — I639 Cerebral infarction, unspecified: Secondary | ICD-10-CM | POA: Diagnosis not present

## 2021-09-10 MED ORDER — EMPAGLIFLOZIN 10 MG PO TABS: 10.0000 mg | ORAL_TABLET | Freq: Every day | ORAL | 3 refills | Status: DC

## 2021-09-10 MED ORDER — FUROSEMIDE 20 MG PO TABS: 20.0000 mg | ORAL_TABLET | ORAL | 2 refills | Status: DC

## 2021-09-10 NOTE — Progress Notes (Signed)
Cardiology Office Note  Date:  09/10/2021   ID:  Brandy [name] May 08, 1940, MRN [id number]  PCP:  [name], MD  Cardiologist:   Skeet Latch, MD   No chief complaint on file.    History of Present Illness: Brandy [name] is a 81 y.o. female with chronic systolic and diastolic heart failure, hypertension, hyperlipidemia, nonobstructive CAD, paroxysmal atrial fibrillation, prior stroke with left hemiparesis, and COPD on home O2 here for follow-up.  She was previously a patient of Dr. Bronson Ing.  She was hospitalized 09/2018 with hypernatremia.  Her sodium was 151.  She was nonambulatory and nonverbal at the time.  Palliative care was consulted and she had acute renal failure with creatinine of 2.5.  A PEG tube was placed.  Since then she gets most of her food via tube feeds and rarely eats by mouth.  She was last seen by cardiology 04/2019.  Her most recent echo 10/2018 revealed LVEF 35 to 40% with moderate LVH and grade 2 diastolic dysfunction.  Her systolic function had improved from 20 to 25% 03/2018.  She was admitted to the hospital with GI bleed in the setting of Eliquis.  Hospitalization was complicated by community-acquired pneumonia.  She was dound to have erosive gastritis and colonic polpys.  She was instructed to hold Eliquis until 01/26/20.  Brandy [name] was seen in the ED 01/29/2020 with acute onset of shortness of breath. High-sensitivity troponin was elevated to 207. EKG was without acute ischemic changes. BNP was elevated to 2179. She was given a dose of IV Lasix and instructed to take Lasix 40 mg daily at home.  Follow-up her breathing was much better.  Furosemide was reduced to 20 mg a day.  Since that time she had not had any lower extremity edema. She did start back taking Eliquis as instructed on 10/30 and her CBC was stable. At her last appointment she was doing well. Her feeding tube fell out 07/2020. She was eating well so it was not replaced.  At the last appointment  she was feeling well.  Today, she is accompanied with her husband and a at home nurse. She has been experiencing abdominal pain, onset a couple of weeks and states the pain is worse in the mornings. Her pain is not modified with food. Of note, her bowels move normally before she stopped her stool softener, but now her bowl movements are not as mobile.  Her family member states that she eat [employer] as part of her diet. Her family member states that she checks her blood pressure daily. Her husband states that she drinks enough water. The patient denies chest pain, shortness of breath, nocturnal dyspnea, orthopnea or peripheral edema. There have been no palpitations, lightheadedness or syncope. Complains of abdominal pain.   Past Medical History:  Diagnosis Date   Cardiomyopathy    a. EF of 25% in 07/2006 b. EF normalized by repeat echo in 2011 c. EF 35-40% by echo in 05/2017 with cath showing mild nonobstructive CAD   Chronic combined systolic and diastolic CHF (congestive heart failure) (HCC)    Chronic combined systolic and diastolic heart failure (HCC)    Colon polyps    Diverticulosis of colon    Hypertension    Hypertensive heart disease    Osteopenia    Paroxysmal atrial fibrillation ([place]) 10/10/2018   Stroke ([place])    TIA (transient ischemic attack)    Tobacco abuse    50 pack year    Past Surgical  History:  Procedure Laterality Date   BREAST BIOPSY     CARPAL TUNNEL RELEASE     right hand   CERVICAL FUSION     CESAREAN SECTION     2 times   COLONOSCOPY  8338   pt uncertain as to whether polypectomy required or performed   COLONOSCOPY WITH PROPOFOL N/A 01/23/2020   Procedure: COLONOSCOPY WITH PROPOFOL;  Surgeon: Rogene Houston, MD;  [facility];  Service: Endoscopy;  Laterality: N/A;   ECTOPIC PREGNANCY SURGERY     ESOPHAGOGASTRODUODENOSCOPY (EGD) WITH PROPOFOL N/A 10/13/2018   Procedure: ESOPHAGOGASTRODUODENOSCOPY (EGD) WITH PROPOFOL;  Surgeon: Aviva Signs, MD;   Location: [facility];  Service: General;  Laterality: N/A;   ESOPHAGOGASTRODUODENOSCOPY (EGD) WITH PROPOFOL N/A 01/23/2020   Procedure: ESOPHAGOGASTRODUODENOSCOPY (EGD) WITH PROPOFOL;  Surgeon: Rogene Houston, MD;  [facility];  Service: Endoscopy;  Laterality: N/A;   IR ANGIO VERTEBRAL SEL SUBCLAVIAN INNOMINATE UNI R MOD SED  02/10/2018   IR CT HEAD LTD  02/10/2018   IR PERCUTANEOUS ART THROMBECTOMY/INFUSION INTRACRANIAL INC DIAG ANGIO  02/10/2018   PEG PLACEMENT N/A 10/13/2018   Procedure: PERCUTANEOUS ENDOSCOPIC GASTROSTOMY (PEG) PLACEMENT;  Surgeon: Aviva Signs, MD;  Location: [facility];  Service: General;  Laterality: N/A;   PEG PLACEMENT N/A 12/04/2019   Procedure: PERCUTANEOUS ENDOSCOPIC GASTROSTOMY (PEG) REPLACEMENT;  Surgeon: Aviva Signs, MD;  [facility];  Service: Gastroenterology;  Laterality: N/A;   PEG PLACEMENT N/A 12/12/2019   Procedure: PERCUTANEOUS ENDOSCOPIC GASTROSTOMY (PEG) REPLACEMENT;  Surgeon: Aviva Signs, MD;  [facility];  Service: Gastroenterology;  Laterality: N/A;   POLYPECTOMY  01/23/2020   Procedure: POLYPECTOMY;  Surgeon: Rogene Houston, MD;  [facility];  Service: Endoscopy;;   RADIOLOGY WITH ANESTHESIA N/A 02/10/2018   Procedure: RADIOLOGY WITH ANESTHESIA;  Surgeon: Luanne Bras, MD;  Location: [facility];  Service: Radiology;  Laterality: N/A;   RIGHT/LEFT HEART CATH AND CORONARY ANGIOGRAPHY N/A 06/03/2017   Procedure: RIGHT/LEFT HEART CATH AND CORONARY ANGIOGRAPHY;  Surgeon: Jettie Booze, MD;  Location: [facility];  Service: Cardiovascular;  Laterality: N/A;   TEE WITHOUT CARDIOVERSION N/A 02/13/2018   Procedure: TRANSESOPHAGEAL ECHOCARDIOGRAM (TEE);  Surgeon: Sueanne Margarita, MD;  Location: [facility] END[facility]COPY;  Service: Cardiovascular;  Laterality: N/A;   TONSILLECTOMY       Current Outpatient Medications  Medication Sig Dispense Refill   acetaminophen (TYLENOL) 325 MG tablet Take 2 tablets (650 mg  total) by mouth every 4 (four) hours as needed for mild pain (or temp > 37.5 C (99.5 F)). (Patient taking differently: Take 650 mg by mouth every 6 (six) hours as needed for mild pain or moderate pain.)     atorvastatin (LIPITOR) 10 MG tablet Take 1 tablet (10 mg total) by mouth daily. 90 tablet 1   carvedilol (COREG) 3.125 MG tablet TAKE 1 TABLET(3.125 MG) BY MOUTH TWICE DAILY WITH A MEAL 180 tablet 3   cetirizine (ZYRTEC) 10 MG tablet Take 10 mg by mouth daily.     citalopram (CELEXA) 10 MG tablet Take 10 mg by mouth daily.     COVID-19 mRNA Vac-TriS, Pfizer, (PFIZER-BIONT COVID-19 VAC-TRIS) SUSP injection Inject into the muscle. 0.3 mL 0   donepezil (ARICEPT) 5 MG tablet Take 1 tablet by mouth at bedtime.  1   ELIQUIS 2.5 MG TABS tablet Take 1 tablet (2.5 mg total) by mouth 2 (two) times daily. 60 tablet    empagliflozin (JARDIANCE) 10 MG TABS tablet Take  1 tablet (10 mg total) by mouth daily. 90 tablet 3   ferrous sulfate 324 MG TBEC Take 324 mg by mouth. M/W/F     isosorbide mononitrate (IMDUR) 30 MG 24 hr tablet TAKE 1/2 TABLET(15 MG) BY MOUTH DAILY 45 tablet 2   Melatonin 5 MG TABS Take by mouth.     pantoprazole (PROTONIX) 40 MG tablet Take 1 tablet (40 mg total) by mouth daily before breakfast. 30 tablet 1   furosemide (LASIX) 20 MG tablet Take 1 tablet (20 mg total) by mouth every other day. 90 tablet 2   No current facility-administered medications for this visit.   Facility-Administered Medications Ordered in Other Visits  Medication Dose Route Frequency Provider Last Rate Last Admin   iohexol (OMNIPAQUE) 300 MG/ML solution 50 mL  50 mL Per Tube Once PRN Long, Wonda Olds, MD        Allergies:   Lisinopril    Social History:  The patient  reports that she quit smoking about 13 years ago. Her smoking use included cigarettes. She started smoking about 61 years ago. She has a 50.00 pack-year smoking history. She has never used smokeless tobacco. She reports that she does not drink  alcohol and does not use drugs.   Family History:  The patient's family history includes Cancer in her paternal grandmother; Hypertension in her brother, father, maternal grandmother, and mother; Stroke in her maternal grandmother.    ROS:  Please see the history of present illness. (+) Abdominal Pain All other systems are reviewed and negative.    PHYSICAL EXAM: VS:  BP 118/80   Pulse (!) 104   Ht '5\' 4"'$  (1.626 m)   BMI 21.63 kg/m  , BMI Body mass index is 21.63 kg/m. GENERAL:  Frail.  Well-appearing.  Wheelchair bound. HEENT:  Pupils equal round and reactive, fundi not visualized, oral mucosa unremarkable NECK:  No jugular venous distention, waveform within normal limits, carotid upstroke brisk and symmetric, no bruits LUNGS:  Clear to auscultation bilaterally HEART:  RRR.  PMI not displaced or sustained,S1 and S2 within normal limits, no S3, no S4, no clicks, no rubs, no murmurs ABD:  Flat, positive bowel sounds normal in frequency in pitch, no bruits, no rebound, no guarding, no midline pulsatile mass, no hepatomegaly, no splenomegaly EXT:  2 plus pulses throughout, no edema, no cyanosis no clubbing SKIN:  No rashes no nodules NEURO: L hemiparesis PSYCH:  AOx3.  Answers questions appropriately.  EKG:   09/10/2021 EKG: Rate 104. Sinus tachycardia, 1 degree AV block, LAFV prior interseptal infarct. 03/05/21: EKG was not ordered. 05/07/20: Sinus bradycardia.  Rate 56 bpm. LAFB.  Prior septal infarct. 02/01/20: sinus rhythm. Rate 87 bpm. LAFB. Prior anteroseptal infarct.  Echo Limited 11/06/2018:  1. The left ventricle has moderately reduced systolic function, with an  ejection fraction of 35-40%. The cavity size was normal. There is moderate  concentric left ventricular hypertrophy. Left ventricular diastolic  Doppler parameters are consistent with   pseudonormal. Left ventricular diffuse hypokinesis.   2. Left atrial size was mildly dilated.   3. The mitral valve is grossly  normal.   4. The tricuspid valve was grossly normal. Tricuspid valve regurgitation  was not assessed by color flow Doppler.   5. The aortic valve is tricuspid.   6. The aortic root is normal in size and structure.   7. The interatrial septum was not assessed.   Echo 03/30/2018: Study Conclusions  - Left ventricle: The cavity size was mildly dilated.  Wall    thickness was increased in a pattern of moderate LVH. Systolic    function was severely reduced. The estimated ejection fraction    was in the range of 20% to 25%. Diffuse hypokinesis. Features are    consistent with a pseudonormal left ventricular filling pattern,    with concomitant abnormal relaxation and increased filling    pressure (grade 2 diastolic dysfunction). Doppler parameters are    consistent with high ventricular filling pressure.  - Mitral valve: Calcified annulus. Mildly thickened leaflets .    There was mild to moderate regurgitation.  - Left atrium: The atrium was mildly dilated.  - Pulmonary arteries: Systolic pressure was moderately increased.    PA peak pressure: 49 mm Hg (S).   Impressions:  - Severe global reduction in LV systolic function; moderate    diastolic dysfunction with elevated LV filling pressure; moderate    LVH; mild LVE; mild to moderate MR; mild LAE; mild TR with    moderate pulmonary hypertension.   R/L Heart Cath 06/03/2017: Ost 2nd Diag to 2nd Diag lesion is 25% stenosed. Mid RCA lesion is 25% stenosed. LV end diastolic pressure is moderately elevated. There is no aortic valve stenosis. LV end diastolic pressure is normal. Hemodynamic findings consistent with mild pulmonary hypertension. CO 5.2 L/min; CI 2.86; Ao 94%; PA sat 65%, mean PA 29 mm Hg; PCWP 20 mm Hg   Mild, nonobstructive CAD.    Mildly elevated right heart pressures with volume overload.    Continue medical therapy.   EF not assessed to minimize contrast exposure.  Diagnostic Dominance: Right   Recent  Labs: 05/21/2021: ALT 13; BUN 28; Creatinine, Ser 1.51; Hemoglobin 11.7; Platelets 200; Potassium 4.2; Sodium 143   Lipid Panel    Component Value Date/Time   CHOL 93 (L) 05/21/2021 1041   TRIG 51 05/21/2021 1041   HDL 42 05/21/2021 1041   CHOLHDL 2.2 05/21/2021 1041   CHOLHDL 2.8 02/11/2018 0447   VLDL 8 02/11/2018 0447   LDLCALC 38 05/21/2021 1041    Wt Readings from Last 3 Encounters:  03/05/21 126 lb (57.2 kg)  05/07/20 126 lb (57.2 kg)  01/23/20 129 lb 3 oz (58.6 kg)     ASSESSMENT AND PLAN:  Acute embolic stroke (HCC) Residual L hemiplegia.  Blood pressure is well-controlled.  Continue carvedilol and Imdur.  She will have her home BP and heart rate recordings sent in.  She doesn't think that her heart rate is typically elevated. Lipids are at goal.   HTN (hypertension) Blood pressure well-controlled.  Continue current regimen.  Chronic combined systolic and diastolic CHF (congestive heart failure) (HCC) LVEF 35-40%.  She is euvolemic.  We will reduce her lasix to every other day.  Add Farxiga 10 mg daily.  Repeat echo given that it has been three years.   CKD (chronic kidney disease) stage 3, GFR 30-59 ml/min (HCC) Adding Farxiga as above.   Hyperlipidemia Lipids are well-controlled on Eliquis.   PAF (paroxysmal atrial fibrillation) (HCC) Currently in sinus tachycardia.  Continue carvedilol and consider switching to metoprolol if heart rates are consistently elevated.  Continue Eliquis.  Renal and weight dosed.  Current medicines are reviewed at length with the patient today.  The patient does not have concerns regarding medicines.  The following changes have been made:  Add [place].  Reduce lasix to every other day.  Labs/ tests ordered today include:   Orders Placed This Encounter  Procedures   Comprehensive metabolic panel  Lipase   EKG 12-Lead   ECHOCARDIOGRAM COMPLETE   Disposition:   FU with Izik Bingman C. Oval Linsey, MD, [facility] in 3 months    I,Tinashe  Williams,acting as a Education administrator for Skeet Latch, MD.,have documented all relevant documentation on the behalf of Skeet Latch, MD,as directed by  Skeet Latch, MD while in the presence of Skeet Latch, MD.   I, Rhine Oval Linsey, MD have reviewed all documentation for this visit.  The documentation of the exam, diagnosis, procedures, and orders on 09/10/2021 are all accurate and complete.

## 2021-09-10 NOTE — Assessment & Plan Note (Signed)
Residual L hemiplegia.  Blood pressure is well-controlled.  Continue carvedilol and Imdur.  She will have her home BP and heart rate recordings sent in.  She doesn't think that her heart rate is typically elevated. Lipids are at goal.

## 2021-09-10 NOTE — Assessment & Plan Note (Signed)
Currently in sinus tachycardia.  Continue carvedilol and consider switching to metoprolol if heart rates are consistently elevated.  Continue Eliquis.  Renal and weight dosed.

## 2021-09-10 NOTE — Patient Instructions (Addendum)
Medication Instructions:  START JARDIANCE 10 MG DAILY   DECREASE YOUR FUROSEMIDE TO EVERY OTHER DAY   *If you need a refill on your cardiac medications before your next appointment, please call your pharmacy*  Lab Work: [place] ECHO   If you have labs (blood work) drawn today and your tests are completely normal, you will receive your results only by: MyChart Message (if you have MyChart) OR A paper copy in the mail If you have any lab test that is abnormal or we need to change your treatment, we will call you to review the results.  Testing/Procedures: Your physician has requested that you have an echocardiogram. Echocardiography is a painless test that uses sound waves to create images of your heart. It provides your doctor with information about the size and shape of your heart and how well your heart's chambers and valves are working. This procedure takes approximately one hour. There are no restrictions for this procedure.  Follow-Up: At [facility], you and your health needs are our priority.  As part of our continuing mission to provide you with exceptional heart care, we have created designated Provider Care Teams.  These Care Teams include your primary Cardiologist (physician) and Advanced Practice Providers (APPs -  Physician Assistants and Nurse Practitioners) who all work together to provide you with the care you need, when you need it.  We recommend signing up for the patient portal called "MyChart".  Sign up information is provided on this After Visit Summary.  MyChart is used to connect with patients for Virtual Visits (Telemedicine).  Patients are able to view lab/test results, encounter notes, upcoming appointments, etc.  Non-urgent messages can be sent to your provider as well.   To learn more about what you can do with MyChart, go to [email or web address].    Your next appointment:   3 month(s)  The format for your next appointment:   In  Person  Provider:   Skeet Latch, MD    Other Instructions SEND BLOOD PRESSURE AND HEART RATE READINGS TO OFFICE

## 2021-09-10 NOTE — Assessment & Plan Note (Signed)
Blood pressure well controlled. Continue current regimen.  

## 2021-09-10 NOTE — Assessment & Plan Note (Addendum)
LVEF 35-40%.  She is euvolemic.  We will reduce her lasix to every other day.  Add Farxiga 10 mg daily.  Repeat echo given that it has been three years.

## 2021-09-10 NOTE — Assessment & Plan Note (Signed)
Lipids are well-controlled on Eliquis.

## 2021-09-10 NOTE — Assessment & Plan Note (Signed)
Adding Farxiga as above.

## 2021-09-11 ENCOUNTER — Encounter (HOSPITAL_BASED_OUTPATIENT_CLINIC_OR_DEPARTMENT_OTHER): Payer: Self-pay | Admitting: Cardiovascular Disease

## 2021-09-16 ENCOUNTER — Telehealth (HOSPITAL_BASED_OUTPATIENT_CLINIC_OR_DEPARTMENT_OTHER): Payer: Self-pay | Admitting: Cardiovascular Disease

## 2021-09-16 NOTE — Telephone Encounter (Signed)
Called patient's husband back. Patient complaining of SOB and elevated DBP and HR.  Patient is taking all her medications as prescribed. Patient wanted [name] to know that her DBP and HR are still high. Will forward to [name] for advisement.

## 2021-09-16 NOTE — Telephone Encounter (Signed)
Pt c/o BP issue: STAT if pt c/o blurred vision, one-sided weakness or slurred speech  1. What are your last 5 BP readings? 122/83  heart rate 77 on 09-14-21    121/78 heart rate 76 09-16-19   today 119/94 heart rate 102, 126/99 heart rate 111  2. Are you having any other symptoms (ex. Dizziness, headache, blurred vision, passed out)?  Short of breath  3. What is your BP issue? Bottom number of  blood pressure is high and heart rate is high

## 2021-09-22 MED ORDER — FUROSEMIDE 20 MG PO TABS: 20.0000 mg | ORAL_TABLET | Freq: Every day | ORAL | 3 refills | Status: DC

## 2021-09-22 MED ORDER — CARVEDILOL 6.25 MG PO TABS: 6.2500 mg | ORAL_TABLET | Freq: Two times a day (BID) | ORAL | 3 refills | Status: DC

## 2021-09-23 ENCOUNTER — Ambulatory Visit (INDEPENDENT_AMBULATORY_CARE_PROVIDER_SITE_OTHER): Payer: Medicare PPO

## 2021-09-23 DIAGNOSIS — I5042 Chronic combined systolic (congestive) and diastolic (congestive) heart failure: Secondary | ICD-10-CM

## 2021-09-23 DIAGNOSIS — Z5181 Encounter for therapeutic drug level monitoring: Secondary | ICD-10-CM | POA: Diagnosis not present

## 2021-09-23 DIAGNOSIS — R109 Unspecified abdominal pain: Secondary | ICD-10-CM | POA: Diagnosis not present

## 2021-09-23 LAB — ECHOCARDIOGRAM COMPLETE
Area-P 1/2: 3.85 cm2
Calc EF: 25.9 %
MV M vel: 2.82 m/s
MV Peak grad: 31.8 mmHg
S' Lateral: 4.4 cm
Single Plane A2C EF: 25.2 %
Single Plane A4C EF: 24.9 %

## 2021-09-23 NOTE — Progress Notes (Unsigned)
Unable to obtain IV on patient for definity. Right arm paralyzed uable to use for IV. Patient had blood drawn in right hand prior to echo. Two stick attempts made and patient refused third attempt from second person. IV attempts made by Jorge Mandril, [place].   Leavy Cella, RDCS

## 2021-09-24 LAB — COMPREHENSIVE METABOLIC PANEL
ALT: 25 IU/L (ref 0–32)
AST: 23 IU/L (ref 0–40)
Albumin/Globulin Ratio: 1.3 (ref 1.2–2.2)
Albumin: 3.8 g/dL (ref 3.7–4.7)
Alkaline Phosphatase: 106 IU/L (ref 44–121)
BUN/Creatinine Ratio: 18 (ref 12–28)
BUN: 28 mg/dL — ABNORMAL HIGH (ref 8–27)
Bilirubin Total: 0.3 mg/dL (ref 0.0–1.2)
CO2: 20 mmol/L (ref 20–29)
Calcium: 9.9 mg/dL (ref 8.7–10.3)
Chloride: 108 mmol/L — ABNORMAL HIGH (ref 96–106)
Creatinine, Ser: 1.6 mg/dL — ABNORMAL HIGH (ref 0.57–1.00)
Globulin, Total: 2.9 g/dL (ref 1.5–4.5)
Glucose: 129 mg/dL — ABNORMAL HIGH (ref 70–99)
Potassium: 4.7 mmol/L (ref 3.5–5.2)
Sodium: 145 mmol/L — ABNORMAL HIGH (ref 134–144)
Total Protein: 6.7 g/dL (ref 6.0–8.5)
eGFR: 33 mL/min/{1.73_m2} — ABNORMAL LOW (ref >59)

## 2021-09-24 LAB — LIPASE: Lipase: 38 U/L (ref 14–85)

## 2021-10-02 ENCOUNTER — Telehealth: Payer: Self-pay | Admitting: Cardiovascular Disease

## 2021-10-02 DIAGNOSIS — Z5181 Encounter for therapeutic drug level monitoring: Secondary | ICD-10-CM

## 2021-10-02 NOTE — Telephone Encounter (Signed)
Spoke with daughter who stated patient started having raised red bumps on her feet about the time she started Jardiance Feet are swollen, now oozing and going up ankles Stated her breathing is good Daughter concerned coming from [place]  Patient does have  left hemiparesis    Attachments [id number]  [id number]  [id number]  [id number]   Above images sent via mychart   Will forward to [place] Mam NP for review, Dr Oval Linsey out of office

## 2021-10-02 NOTE — Telephone Encounter (Signed)
See phone note 7/7

## 2021-10-02 NOTE — Telephone Encounter (Signed)
Pt c/o medication issue:  1. Name of Medication: empagliflozin (JARDIANCE) 10 MG TABS tablet  2. How are you currently taking this medication (dosage and times per day)? Take 1 tablet (10 mg total) by mouth daily.  3. Are you having a reaction (difficulty breathing--STAT)?   4. What is your medication issue? Patient's daughter called stating her mother has a rash on her feet it's starting to go up her leg.  The rash is also starting to pus.  Daughter wants to know if there is anything she can apply to the rash.  Patient's daughter is thinking the medication is causing the rash, because the rash started at the same time she started taking the medication.

## 2021-10-02 NOTE — Telephone Encounter (Signed)
Advised daughter, verbalized understanding  Mailed lab orders for repeat BMET

## 2021-10-02 NOTE — Telephone Encounter (Signed)
Of note at the time she started Jardiance her Lasix was also reduced to every other day.  Anticipate the swelling is related to volume overload and the bumps related to skin irritation.  Low suspicion related to Jardiance.  Continue Jardiance.  Increase Lasix to 40 mg for one day then 20 mg daily.  Would recommend BMP in 1-2 weeks for monitoring. Ensure elevating legs when sitting.   Loel Dubonnet, NP

## 2021-10-05 DIAGNOSIS — S81801A Unspecified open wound, right lower leg, initial encounter: Secondary | ICD-10-CM | POA: Diagnosis not present

## 2021-10-05 DIAGNOSIS — S81802A Unspecified open wound, left lower leg, initial encounter: Secondary | ICD-10-CM | POA: Diagnosis not present

## 2021-10-05 DIAGNOSIS — N183 Chronic kidney disease, stage 3 unspecified: Secondary | ICD-10-CM | POA: Diagnosis not present

## 2021-10-05 DIAGNOSIS — I5042 Chronic combined systolic (congestive) and diastolic (congestive) heart failure: Secondary | ICD-10-CM | POA: Diagnosis not present

## 2021-10-05 DIAGNOSIS — I480 Paroxysmal atrial fibrillation: Secondary | ICD-10-CM | POA: Diagnosis not present

## 2021-10-05 DIAGNOSIS — J449 Chronic obstructive pulmonary disease, unspecified: Secondary | ICD-10-CM | POA: Diagnosis not present

## 2021-10-06 NOTE — Telephone Encounter (Signed)
Spoke with husband and patient saw PCP last week who D/C her Jardiance and referred to wound clinic  PCP also d/c her Aricept secondary to loss of appetite

## 2021-10-06 NOTE — Addendum Note (Signed)
Addended by: Alvina Filbert B on: 10/06/2021 06:32 PM   Modules accepted: Orders

## 2021-10-15 ENCOUNTER — Encounter: Payer: Medicare PPO | Attending: Physician Assistant | Admitting: Physician Assistant

## 2021-10-15 DIAGNOSIS — L239 Allergic contact dermatitis, unspecified cause: Secondary | ICD-10-CM | POA: Insufficient documentation

## 2021-10-15 DIAGNOSIS — L308 Other specified dermatitis: Secondary | ICD-10-CM | POA: Diagnosis not present

## 2021-10-15 DIAGNOSIS — L98498 Non-pressure chronic ulcer of skin of other sites with other specified severity: Secondary | ICD-10-CM | POA: Diagnosis not present

## 2021-10-15 NOTE — Progress Notes (Signed)
Brandy [name], Brandy [name] ([id number]) Visit Report for 10/15/2021 Chief Complaint Document Details Patient Name: Brandy [name]. Date of Service: 10/15/2021 10:00 AM Medical Record Number: [id number] Patient Account Number: [id number] Date of Birth/Sex: [birth year] (81 y.o. F) Treating RN: Levora Dredge Primary Care Provider: [name] Other Clinician: Referring Provider: [name] Treating Provider/Extender: Skipper Cliche in Treatment: 0 Information Obtained from: Patient Chief Complaint Full body rash due to allergic reaction Electronic Signature(s) Signed: 10/15/2021 1:30:50 PM By: Worthy Keeler PA-C Entered By: Worthy Keeler on 10/15/2021 13:30:50 Brandy [name], Brandy [name] ([id number]) -------------------------------------------------------------------------------- HPI Details Patient Name: Brandy [name] Date of Service: 10/15/2021 10:00 AM Medical Record Number: [id number] Patient Account Number: [id number] Date of Birth/Sex: [birth year] (81 y.o. F) Treating RN: Levora Dredge Primary Care Provider: [name] Other Clinician: Referring Provider: [name] Treating Provider/Extender: Skipper Cliche in Treatment: 0 History of Present Illness HPI Description: 10-15-2021 patient presents for initial inspection here in the clinic with her husband. She actually has a full body rash that occurred after she was placed on Jardiance. Unfortunately this had spread from her legs all the way up to her back and neck region as well. She was referred to [place] due to apparently some issues with her legs where she had some "bleeding and weeping". With that being said her legs are completely dry I see where there could be some evidence of issues in the past but there really is nothing open at all the main issue that is presented forward to me is actually that she is having a rash on her hands as well as her full body. Again I think this is something she probably needs to follow-up with her  primary care provider concerning Electronic Signature(s) Signed: 10/15/2021 1:31:40 PM By: Worthy Keeler PA-C Entered By: Worthy Keeler on 10/15/2021 13:31:39 Brandy [name], Brandy [name] ([id number]) -------------------------------------------------------------------------------- Physical Exam Details Patient Name: Brandy [name] Date of Service: 10/15/2021 10:00 AM Medical Record Number: [id number] Patient Account Number: [id number] Date of Birth/Sex: September 14, 1940 (81 y.o. F) Treating RN: Levora Dredge Primary Care Provider: [name] Other Clinician: Referring Provider: [name] Treating Provider/Extender: Skipper Cliche in Treatment: 0 Constitutional Well-nourished and well-hydrated in no acute distress. Respiratory normal breathing without difficulty. Psychiatric this patient is able to make decisions and demonstrates good insight into disease process. Alert and Oriented x 3. pleasant and cooperative. Notes Patient does not show any signs of infection at this point which is great news. With that being said I do believe that based on what I am seeing with a full body papular skin rash this appears to be very much an allergic reaction to medication. Again this is something that would fall in the realm of primary care to manage and monitor or potentially dermatology if they feel it is out of the scope of what they can manage. For that reason I did actually contact patient's primary care provider which was Dr. Nevada Crane at [facility]. Subsequently I spoke with Dr. Juel Burrow nurse and they are going to make a referral for the patient to dermatology. She also noted that they would send something to help with the itching after she talks with Dr. Nevada Crane. Electronic Signature(s) Signed: 10/15/2021 1:32:37 PM By: Worthy Keeler PA-C Entered By: Worthy Keeler on 10/15/2021 13:32:37 Brandy [name], Brandy [name]  ([id number]) -------------------------------------------------------------------------------- Physician Orders Details Patient Name: Brandy [name] Date of Service: 10/15/2021 10:00 AM Medical Record Number: [id number] Patient Account Number: [id number] Date of Birth/Sex: [birth year] (81 y.o.  F) Treating RN: Levora Dredge Primary Care Provider: [name] Other Clinician: Referring Provider: [name] Treating Provider/Extender: Skipper Cliche in Treatment: 0 Verbal / Phone Orders: No Diagnosis Coding Discharge From [facility]. REFERRAL TO DERMATOLOGY WILL BE PLACED BY [facility]. THEY PLAN TO CALL YOU AND DISCUS MEDICATIONS. Electronic Signature(s) Signed: 10/15/2021 4:27:06 PM By: Levora Dredge Signed: 10/15/2021 5:07:46 PM By: Worthy Keeler PA-C Entered By: Levora Dredge on 10/15/2021 10:56:33 Brandy [name], Brandy [name] ([id number]) -------------------------------------------------------------------------------- Problem List Details Patient Name: Brandy [name] Date of Service: 10/15/2021 10:00 AM Medical Record Number: [id number] Patient Account Number: [id number] Date of Birth/Sex: Apr 13, 1940 (81 y.o. F) Treating RN: Levora Dredge Primary Care Provider: [name] Other Clinician: Referring Provider: [name] Treating Provider/Extender: Skipper Cliche in Treatment: 0 Active Problems ICD-10 Encounter Code Description Active Date MDM Diagnosis L23.9 Allergic contact dermatitis, unspecified cause 10/15/2021 No Yes Inactive Problems Resolved Problems Electronic Signature(s) Signed: 10/15/2021 10:56:57 AM By: Worthy Keeler PA-C Entered By: Worthy Keeler on 10/15/2021 10:56:57 Brandy [name], Brandy [name] ([id number]) -------------------------------------------------------------------------------- Progress Note Details Patient Name: Brandy [name] Date of Service: 10/15/2021 10:00 AM Medical Record Number: [id number] Patient Account Number: [id number] Date of Birth/Sex: July 13, 1940 (81 y.o. F) Treating RN: Levora Dredge Primary Care Provider: [name] Other Clinician: Referring Provider: [name] Treating Provider/Extender: Skipper Cliche in Treatment: 0 Subjective Chief Complaint Information obtained from Patient Full body rash due to allergic reaction History of Present Illness (HPI) 10-15-2021 patient presents for initial inspection here in the clinic with her husband. She actually has a full body rash that occurred after she was placed on Jardiance. Unfortunately this had spread from her legs all the way up to her back and neck region as well. She was referred to [place] due to apparently some issues with her legs where she had some "bleeding and weeping". With that being said her legs are completely dry I see where there could be some evidence of issues in the past but there really is nothing open at all the main issue that is presented forward to me is actually that she is having a rash on her hands as well as her full body. Again I think this is something she probably needs to follow-up with her primary care provider concerning Objective Constitutional Well-nourished and well-hydrated in no acute distress. Vitals Time Taken: 10:30 AM. Respiratory normal breathing without difficulty. Psychiatric this patient is able to make decisions and demonstrates good insight into disease process. Alert and Oriented x 3. pleasant and cooperative. General Notes: Patient does not show any signs of infection at this point which is great news. With that being said I do believe that based on what I am seeing with a full body papular skin rash this appears to be very much an allergic reaction to medication. Again this is something that would fall in the realm of primary care to manage and monitor or potentially dermatology if  they feel it is out of the scope of what they can manage. For that reason I did actually contact patient's primary care provider which was Dr. Nevada Crane at [facility]. Subsequently I spoke with Dr. Juel Burrow nurse and they are going to make a referral for the patient to dermatology. She also noted that they would send something to help with the itching after she talks with Dr. Nevada Crane. Other Condition(s) Patient presents with  Rash / Dermatitis located on the Full Body. General Notes: Patient has fine papular rash from head to toe with no open wounds. Assessment Active Problems ICD-10 Allergic contact dermatitis, unspecified cause Brandy [name], Brandy [name] ([id number]) Plan Discharge From [facility] Services: Consult Only - PHYSICIAN THAT PRESCRIBED MEDICATION THAT CAUSED REACTION NORMALLY WOULD FOLLOW YOU FOR THIS RASH ISSUE. REFERRAL TO DERMATOLOGY WILL BE PLACED [facility]. THEY PLAN TO CALL YOU AND DISCUS MEDICATIONS. 1. Would recommend currently that we go ahead and have the patient continue to follow-up with his primary care provider they should be reaching out today in order to get in touch with the husband with regard to his wife treatment as far as what to do for the itching as well as referral to dermatology. 2. I am also can recommend if anything changes or any open wounds are observed they can definitely contact the office and let me know. We will see the patient back just as needed at this point this is consult only. Electronic Signature(s) Signed: 10/15/2021 1:33:08 PM By: Worthy Keeler PA-C Entered By: Worthy Keeler on 10/15/2021 13:33:08 Brandy [name], Brandy [name] ([id number]) -------------------------------------------------------------------------------- SuperBill Details Patient Name: Brandy [name] Date of Service: 10/15/2021 Medical Record Number: [id number] Patient Account Number: [id number] Date of Birth/Sex: 1940/08/18 (81 y.o. F) Treating RN: Levora Dredge Primary Care Provider:  [name] Other Clinician: Referring Provider: [name] Treating Provider/Extender: Skipper Cliche in Treatment: 0 Diagnosis Coding ICD-10 Codes Code Description L23.9 Allergic contact dermatitis, unspecified cause Facility Procedures CPT4 Code: 54008676 Description: [phone] - WOUND CARE VISIT-LEV 1 EST PT Modifier: Quantity: 1 Physician Procedures CPT4 Code: 3267124 Description: 58099 - WC PHYS LEVEL 2 - NEW PT Modifier: Quantity: 1 CPT4 Code: Description: ICD-10 Diagnosis Description L23.9 Allergic contact dermatitis, unspecified cause Modifier: Quantity: Electronic Signature(s) Signed: 10/15/2021 1:33:22 PM By: Worthy Keeler PA-C Entered By: Worthy Keeler on 10/15/2021 13:33:22

## 2021-10-15 NOTE — Progress Notes (Signed)
Brandy [name] ([id number]) Visit Report for 10/15/2021 Arrival Information Details Patient Name: Brandy [name], Brandy [name]. Date of Service: 10/15/2021 10:00 AM Medical Record Number: [id number] Patient Account Number: [id number] Date of Birth/Sex: 1941/03/21 (81 y.o. F) Treating RN: Brandy [name] Primary Care Brandy [name]: Brandy [name] Other Clinician: Referring Brandy [name]: Brandy [name] Treating Brandy [name]/Extender: Brandy [name] in Treatment: 0 Visit Information Patient Arrived: Wheel Chair Arrival Time: 10:51 Accompanied By: son Transfer Assistance: Civil Service fast streamer Patient Identification Verified: Yes Secondary Verification Process Completed: Yes Patient Requires Transmission-Based Precautions: No Patient Has Alerts: No Electronic Signature(s) Signed: 10/15/2021 4:27:06 PM By: Brandy [name] Entered By: Brandy [name] on 10/15/2021 10:55:08 Brandy [name] ([id number]) -------------------------------------------------------------------------------- Clinic Level of Care Assessment Details Patient Name: Brandy [name] Date of Service: 10/15/2021 10:00 AM Medical Record Number: [id number] Patient Account Number: [id number] Date of Birth/Sex: May 25, 1940 (81 y.o. F) Treating RN: Brandy [name] Primary Care Brandy [name]: Brandy [name] Other Clinician: Referring Brandy [name]: Brandy [name] Treating Catheleen Langhorne/Extender: Brandy [name] in Treatment: 0 Clinic Level of Care Assessment Items TOOL 1 Quantity Score '[]'$  - Use when EandM and Procedure is performed on INITIAL visit 0 ASSESSMENTS - Nursing Assessment / Reassessment '[]'$  - General Physical Exam (combine w/ comprehensive assessment (listed just below) when performed on new 0 pt. evals) '[]'$  - 0 Comprehensive Assessment (HX, ROS, Risk Assessments, Wounds Hx, etc.) ASSESSMENTS - Wound and Skin Assessment / Reassessment X - Dermatologic / Skin Assessment (not related to wound area) 1 10 ASSESSMENTS - Ostomy and/or Continence Assessment and Care '[]'$   - Incontinence Assessment and Management 0 '[]'$  - 0 Ostomy Care Assessment and Management (repouching, etc.) PROCESS - Coordination of Care '[]'$  - Simple Patient / Family Education for ongoing care 0 '[]'$  - 0 Complex (extensive) Patient / Family Education for ongoing care X- 1 10 Staff obtains Consents, Records, Test Results / Process Orders '[]'$  - 0 Staff telephones HHA, Nursing Homes / Clarify orders / etc '[]'$  - 0 Routine Transfer to another Facility (non-emergent condition) '[]'$  - 0 Routine Hospital Admission (non-emergent condition) X- 1 15 New Admissions / Biomedical engineer / Ordering NPWT, Apligraf, etc. '[]'$  - 0 Emergency Hospital Admission (emergent condition) PROCESS - Special Needs '[]'$  - Pediatric / Minor Patient Management 0 '[]'$  - 0 Isolation Patient Management '[]'$  - 0 Hearing / Language / Visual special needs '[]'$  - 0 Assessment of Community assistance (transportation, D/C planning, etc.) '[]'$  - 0 Additional assistance / Altered mentation '[]'$  - 0 Support Surface(s) Assessment (bed, cushion, seat, etc.) INTERVENTIONS - Miscellaneous '[]'$  - External ear exam 0 '[]'$  - 0 Patient Transfer (multiple staff / Civil Service fast streamer / Similar devices) '[]'$  - 0 Simple Staple / Suture removal (25 or less) '[]'$  - 0 Complex Staple / Suture removal (26 or more) '[]'$  - 0 Hypo/Hyperglycemic Management (do not check if billed separately) '[]'$  - 0 Ankle / Brachial Index (ABI) - do not check if billed separately Has the patient been seen at the hospital within the last three years: Yes Total Score: 35 Level Of Care: New/Established - Level 1 [name], [name] ([id number]) Electronic Signature(s) Signed: 10/15/2021 4:27:06 PM By: Brandy [name] Entered By: Brandy [name] on 10/15/2021 10:53:23 Hewett, Brandy [name] ([id number]) -------------------------------------------------------------------------------- Encounter Discharge Information Details Patient Name: Brandy [name] Date of Service: 10/15/2021  10:00 AM Medical Record Number: [id number] Patient Account Number: [id number] Date of Birth/Sex: 31-Oct-1940 (81 y.o. F) Treating RN: Brandy [name] Primary Care Brandy [name]: Brandy [name] Other Clinician: Referring Anzleigh Slaven: Brandy [name] Treating Kymberley Raz/Extender: Brandy [name] in Treatment: 0 Encounter  Discharge Information Items Discharge Condition: Stable Ambulatory Status: Wheelchair Discharge Destination: Home Transportation: Private Auto Accompanied By: husband/aide Schedule Follow-up Appointment: No Clinical Summary of Care: Electronic Signature(s) Signed: 10/15/2021 4:27:06 PM By: Brandy [name] Entered By: Brandy [name] on 10/15/2021 10:57:32 Radel, Brandy [name] ([id number]) -------------------------------------------------------------------------------- Lower Extremity Assessment Details Patient Name: Brandy [name] Date of Service: 10/15/2021 10:00 AM Medical Record Number: [id number] Patient Account Number: [id number] Date of Birth/Sex: 02-Jun-1940 (81 y.o. F) Treating RN: Brandy [name] Primary Care Rozalyn Osland: Brandy [name] Other Clinician: Referring Hashem Goynes: Brandy [name] Treating Jassen Sarver/Extender: Brandy [name] in Treatment: 0 Electronic Signature(s) Signed: 10/15/2021 4:27:06 PM By: Brandy [name] Entered By: Brandy [name] on 10/15/2021 10:55:43 Russi, Brandy [name] ([id number]) -------------------------------------------------------------------------------- Multi Wound Chart Details Patient Name: Brandy [name] Date of Service: 10/15/2021 10:00 AM Medical Record Number: [id number] Patient Account Number: [id number] Date of Birth/Sex: Sep 25, 1940 (81 y.o. F) Treating RN: Brandy [name] Primary Care Zeenat Jeanbaptiste: Brandy [name] Other Clinician: Referring Hennie Gosa: Brandy [name] Treating Chase Knebel/Extender: Brandy [name] Weeks in Treatment: 0 Wound Assessments Treatment Notes Electronic Signature(s) Signed: 10/15/2021 4:27:06 PM By: Brandy [name] Entered By:  Brandy [name] on 10/15/2021 10:56:24 Brandy [name] ([id number]) -------------------------------------------------------------------------------- [place] Details Patient Name: Brandy [name] Date of Service: 10/15/2021 10:00 AM Medical Record Number: [id number] Patient Account Number: [id number] Date of Birth/Sex: Oct 14, 1940 (81 y.o. F) Treating RN: Brandy [name] Primary Care Taariq Leitz: Brandy [name] Other Clinician: Referring Soul Deveney: Brandy [name] Treating Avir Deruiter/Extender: Brandy [name] in Treatment: 0 Active Inactive Electronic Signature(s) Signed: 10/15/2021 4:27:06 PM By: Brandy [name] Entered By: Brandy [name] on 10/15/2021 10:56:17 Beining, Brandy [name] ([id number]) -------------------------------------------------------------------------------- Non-Wound Condition Assessment Details Patient Name: Brandy [name] Date of Service: 10/15/2021 10:00 AM Medical Record Number: [id number] Patient Account Number: [id number] Date of Birth/Sex: July 21, 1940 (81 y.o. F) Treating RN: Brandy [name] Primary Care Maalik Pinn: Brandy [name] Other Clinician: Referring Camiah Humm: Brandy [name] Treating Millenia Waldvogel/Extender: Brandy [name] in Treatment: 0 Non-Wound Condition: Condition: Rash / Dermatitis Location: Other: Full Body Side: Notes Patient has fine papular rash from head to toe with no open wounds. Electronic Signature(s) Signed: 10/15/2021 4:27:06 PM By: Brandy [name] Entered By: Brandy [name] on 10/15/2021 10:55:01 Azzarello, Brandy [name] ([id number]) -------------------------------------------------------------------------------- Pain Assessment Details Patient Name: Brandy [name] Date of Service: 10/15/2021 10:00 AM Medical Record Number: [id number] Patient Account Number: [id number] Date of Birth/Sex: [birth year] (81 y.o. F) Treating RN: Brandy [name] Primary Care Liisa Picone: Brandy [name] Other Clinician: Referring Kaylyne Axton: Brandy [name] Treating Yvonne Petite/Extender: Brandy [name] in Treatment: 0 Active Problems Location of Pain Severity and Description of Pain Patient Has Paino No Site Locations Rate the pain. Current Pain Level: 0 Pain Management and Medication Current Pain Management: Electronic Signature(s) Signed: 10/15/2021 4:27:06 PM By: Brandy [name] Entered By: Brandy [name] on 10/15/2021 10:55:18 Pflaum, Brandy [name] ([id number]) -------------------------------------------------------------------------------- Patient/Caregiver Education Details Patient Name: Brandy [name] Date of Service: 10/15/2021 10:00 AM Medical Record Number: [id number] Patient Account Number: [id number] Date of Birth/Gender: 06/09/1940 (81 y.o. F) Treating RN: Brandy [name] Primary Care Physician: Brandy [name] Other Clinician: Referring Physician: Iona [name] Treating Physician/Extender: Brandy [name] in Treatment: 0 Education Assessment Education Provided To: Patient Education Topics Provided Wound/Skin Impairment: Handouts: Other: progress with rash Methods: Explain/Verbal Responses: State content correctly Electronic Signature(s) Signed: 10/15/2021 4:27:06 PM By: Brandy [name] Entered By: Brandy [name] on 10/15/2021 10:57:01 Brandy [name] ([id number]) -------------------------------------------------------------------------------- Vitals Details Patient Name: Brandy [name] Date of Service: 10/15/2021 10:00 AM Medical Record Number: [id number] Patient Account Number: [id number] Date of Birth/Sex: 11/07/1940 (81  y.o. F) Treating RN: Brandy [name] Primary Care Brinn Westby: Brandy [name] Other Clinician: Referring Damari Hiltz: Brandy [name] Treating Goddess Gebbia/Extender: Brandy [name] in Treatment: 0 Vital Signs Time Taken: 10:30 Reference Range: 80 - 120 mg / dl Electronic Signature(s) Signed: 10/15/2021 4:27:06 PM By: Brandy [name] Entered By: Brandy [name] on 10/15/2021 10:55:29

## 2021-10-19 DIAGNOSIS — R21 Rash and other nonspecific skin eruption: Secondary | ICD-10-CM | POA: Diagnosis not present

## 2021-10-19 DIAGNOSIS — I480 Paroxysmal atrial fibrillation: Secondary | ICD-10-CM | POA: Diagnosis not present

## 2021-10-19 DIAGNOSIS — L299 Pruritus, unspecified: Secondary | ICD-10-CM | POA: Diagnosis not present

## 2021-10-26 DIAGNOSIS — T383X5A Adverse effect of insulin and oral hypoglycemic [antidiabetic] drugs, initial encounter: Secondary | ICD-10-CM | POA: Diagnosis not present

## 2021-10-26 DIAGNOSIS — L258 Unspecified contact dermatitis due to other agents: Secondary | ICD-10-CM | POA: Diagnosis not present

## 2021-11-16 DIAGNOSIS — L258 Unspecified contact dermatitis due to other agents: Secondary | ICD-10-CM | POA: Diagnosis not present

## 2021-11-17 ENCOUNTER — Telehealth (HOSPITAL_BASED_OUTPATIENT_CLINIC_OR_DEPARTMENT_OTHER): Payer: Self-pay | Admitting: *Deleted

## 2021-11-17 DIAGNOSIS — R9430 Abnormal result of cardiovascular function study, unspecified: Secondary | ICD-10-CM

## 2021-11-17 DIAGNOSIS — I5042 Chronic combined systolic (congestive) and diastolic (congestive) heart failure: Secondary | ICD-10-CM

## 2021-11-17 NOTE — Telephone Encounter (Signed)
-----   Message from Loel Dubonnet, NP sent at 11/17/2021 12:48 PM EDT ----- Echo shows reduced LVEF 20-25%.  Severe thickening and stiffness of the heart muscle.  RV function moderately reduced.  Mild leaking of the mitral valve.  Strain pattern suggestive of amyloidosis.  LVEF is decreased compared to previous.  Recommend PYP scan for further evaluation.  Follow up as scheduled 12/02/21

## 2021-11-17 NOTE — Telephone Encounter (Signed)
Advised patient and husband Order placed and message to scheduling to arrange

## 2021-11-18 NOTE — Telephone Encounter (Signed)
Patient following back up with you

## 2021-11-18 NOTE — Telephone Encounter (Signed)
[name] daughter is calling back to go over results that were given to her parents yesterday. She said they seemed slightly confused and she wanted to get some clarification on the results.

## 2021-11-18 NOTE — Telephone Encounter (Signed)
Spoke with daughter, ok per husband  Rescheduled follow up until after PYP scan

## 2021-11-23 ENCOUNTER — Other Ambulatory Visit: Payer: Self-pay

## 2021-11-23 ENCOUNTER — Telehealth: Payer: Self-pay | Admitting: Cardiovascular Disease

## 2021-11-23 ENCOUNTER — Inpatient Hospital Stay (HOSPITAL_COMMUNITY)
Admission: EM | Admit: 2021-11-23 | Discharge: 2021-11-28 | DRG: 291 | Disposition: A | Discharge status: Home / Self Care | Payer: Medicare PPO | Attending: Internal Medicine | Admitting: Internal Medicine

## 2021-11-23 ENCOUNTER — Encounter (HOSPITAL_COMMUNITY): Payer: Self-pay

## 2021-11-23 ENCOUNTER — Emergency Department (HOSPITAL_COMMUNITY): Payer: Medicare PPO

## 2021-11-23 DIAGNOSIS — I5041 Acute combined systolic (congestive) and diastolic (congestive) heart failure: Secondary | ICD-10-CM | POA: Diagnosis not present

## 2021-11-23 DIAGNOSIS — Z515 Encounter for palliative care: Secondary | ICD-10-CM | POA: Diagnosis not present

## 2021-11-23 DIAGNOSIS — I130 Hypertensive heart and chronic kidney disease with heart failure and stage 1 through stage 4 chronic kidney disease, or unspecified chronic kidney disease: Principal | ICD-10-CM | POA: Diagnosis present

## 2021-11-23 DIAGNOSIS — E785 Hyperlipidemia, unspecified: Secondary | ICD-10-CM | POA: Diagnosis present

## 2021-11-23 DIAGNOSIS — J449 Chronic obstructive pulmonary disease, unspecified: Secondary | ICD-10-CM | POA: Diagnosis present

## 2021-11-23 DIAGNOSIS — K5730 Diverticulosis of large intestine without perforation or abscess without bleeding: Secondary | ICD-10-CM | POA: Diagnosis not present

## 2021-11-23 DIAGNOSIS — Z87891 Personal history of nicotine dependence: Secondary | ICD-10-CM

## 2021-11-23 DIAGNOSIS — G8929 Other chronic pain: Secondary | ICD-10-CM | POA: Diagnosis present

## 2021-11-23 DIAGNOSIS — N1832 Chronic kidney disease, stage 3b: Secondary | ICD-10-CM | POA: Diagnosis not present

## 2021-11-23 DIAGNOSIS — Z6821 Body mass index (BMI) 21.0-21.9, adult: Secondary | ICD-10-CM | POA: Diagnosis not present

## 2021-11-23 DIAGNOSIS — I428 Other cardiomyopathies: Secondary | ICD-10-CM | POA: Diagnosis present

## 2021-11-23 DIAGNOSIS — I5043 Acute on chronic combined systolic (congestive) and diastolic (congestive) heart failure: Secondary | ICD-10-CM | POA: Diagnosis present

## 2021-11-23 DIAGNOSIS — Z7189 Other specified counseling: Secondary | ICD-10-CM | POA: Diagnosis not present

## 2021-11-23 DIAGNOSIS — I69354 Hemiplegia and hemiparesis following cerebral infarction affecting left non-dominant side: Secondary | ICD-10-CM | POA: Diagnosis not present

## 2021-11-23 DIAGNOSIS — I509 Heart failure, unspecified: Principal | ICD-10-CM

## 2021-11-23 DIAGNOSIS — I110 Hypertensive heart disease with heart failure: Secondary | ICD-10-CM | POA: Diagnosis not present

## 2021-11-23 DIAGNOSIS — Z9981 Dependence on supplemental oxygen: Secondary | ICD-10-CM

## 2021-11-23 DIAGNOSIS — R0902 Hypoxemia: Secondary | ICD-10-CM | POA: Diagnosis not present

## 2021-11-23 DIAGNOSIS — I248 Other forms of acute ischemic heart disease: Secondary | ICD-10-CM | POA: Diagnosis present

## 2021-11-23 DIAGNOSIS — R109 Unspecified abdominal pain: Secondary | ICD-10-CM | POA: Diagnosis present

## 2021-11-23 DIAGNOSIS — Z8249 Family history of ischemic heart disease and other diseases of the circulatory system: Secondary | ICD-10-CM

## 2021-11-23 DIAGNOSIS — M8580 Other specified disorders of bone density and structure, unspecified site: Secondary | ICD-10-CM | POA: Diagnosis present

## 2021-11-23 DIAGNOSIS — I3139 Other pericardial effusion (noninflammatory): Secondary | ICD-10-CM | POA: Diagnosis present

## 2021-11-23 DIAGNOSIS — K6289 Other specified diseases of anus and rectum: Secondary | ICD-10-CM | POA: Diagnosis not present

## 2021-11-23 DIAGNOSIS — I480 Paroxysmal atrial fibrillation: Secondary | ICD-10-CM | POA: Diagnosis present

## 2021-11-23 DIAGNOSIS — Z20822 Contact with and (suspected) exposure to covid-19: Secondary | ICD-10-CM | POA: Diagnosis present

## 2021-11-23 DIAGNOSIS — Z7401 Bed confinement status: Secondary | ICD-10-CM

## 2021-11-23 DIAGNOSIS — Z823 Family history of stroke: Secondary | ICD-10-CM | POA: Diagnosis not present

## 2021-11-23 DIAGNOSIS — M419 Scoliosis, unspecified: Secondary | ICD-10-CM | POA: Diagnosis present

## 2021-11-23 DIAGNOSIS — J9611 Chronic respiratory failure with hypoxia: Secondary | ICD-10-CM | POA: Diagnosis not present

## 2021-11-23 DIAGNOSIS — I10 Essential (primary) hypertension: Secondary | ICD-10-CM | POA: Diagnosis present

## 2021-11-23 DIAGNOSIS — Z79899 Other long term (current) drug therapy: Secondary | ICD-10-CM

## 2021-11-23 DIAGNOSIS — I2510 Atherosclerotic heart disease of native coronary artery without angina pectoris: Secondary | ICD-10-CM | POA: Diagnosis present

## 2021-11-23 DIAGNOSIS — J90 Pleural effusion, not elsewhere classified: Secondary | ICD-10-CM | POA: Diagnosis not present

## 2021-11-23 DIAGNOSIS — I5023 Acute on chronic systolic (congestive) heart failure: Secondary | ICD-10-CM | POA: Diagnosis not present

## 2021-11-23 DIAGNOSIS — I5082 Biventricular heart failure: Secondary | ICD-10-CM | POA: Diagnosis present

## 2021-11-23 DIAGNOSIS — Z86010 Personal history of colonic polyps: Secondary | ICD-10-CM

## 2021-11-23 DIAGNOSIS — I493 Ventricular premature depolarization: Secondary | ICD-10-CM | POA: Diagnosis present

## 2021-11-23 DIAGNOSIS — Z888 Allergy status to other drugs, medicaments and biological substances status: Secondary | ICD-10-CM

## 2021-11-23 DIAGNOSIS — N183 Chronic kidney disease, stage 3 unspecified: Secondary | ICD-10-CM | POA: Diagnosis present

## 2021-11-23 DIAGNOSIS — K6389 Other specified diseases of intestine: Secondary | ICD-10-CM | POA: Diagnosis not present

## 2021-11-23 DIAGNOSIS — Z993 Dependence on wheelchair: Secondary | ICD-10-CM

## 2021-11-23 DIAGNOSIS — K7689 Other specified diseases of liver: Secondary | ICD-10-CM | POA: Diagnosis not present

## 2021-11-23 DIAGNOSIS — J811 Chronic pulmonary edema: Secondary | ICD-10-CM | POA: Diagnosis not present

## 2021-11-23 DIAGNOSIS — R000 Tachycardia, unspecified: Secondary | ICD-10-CM | POA: Diagnosis present

## 2021-11-23 DIAGNOSIS — N281 Cyst of kidney, acquired: Secondary | ICD-10-CM | POA: Diagnosis not present

## 2021-11-23 DIAGNOSIS — D631 Anemia in chronic kidney disease: Secondary | ICD-10-CM | POA: Diagnosis present

## 2021-11-23 DIAGNOSIS — Z7901 Long term (current) use of anticoagulants: Secondary | ICD-10-CM

## 2021-11-23 DIAGNOSIS — E43 Unspecified severe protein-calorie malnutrition: Secondary | ICD-10-CM | POA: Diagnosis present

## 2021-11-23 DIAGNOSIS — R778 Other specified abnormalities of plasma proteins: Secondary | ICD-10-CM

## 2021-11-23 DIAGNOSIS — R0602 Shortness of breath: Secondary | ICD-10-CM | POA: Diagnosis not present

## 2021-11-23 LAB — CBC WITH DIFFERENTIAL/PLATELET
Abs Immature Granulocytes: 0.02 10*3/uL (ref 0.00–0.07)
Basophils Absolute: 0 10*3/uL (ref 0.0–0.1)
Basophils Relative: 0 %
Eosinophils Absolute: 0.1 10*3/uL (ref 0.0–0.5)
Eosinophils Relative: 2 %
HCT: 35 % — ABNORMAL LOW (ref 36.0–46.0)
Hemoglobin: 10.7 g/dL — ABNORMAL LOW (ref 12.0–15.0)
Immature Granulocytes: 0 %
Lymphocytes Relative: 26 %
Lymphs Abs: 1.2 10*3/uL (ref 0.7–4.0)
MCH: 29.2 pg (ref 26.0–34.0)
MCHC: 30.6 g/dL (ref 30.0–36.0)
MCV: 95.4 fL (ref 80.0–100.0)
Monocytes Absolute: 0.3 10*3/uL (ref 0.1–1.0)
Monocytes Relative: 7 %
Neutro Abs: 2.9 10*3/uL (ref 1.7–7.7)
Neutrophils Relative %: 65 %
Platelets: 201 10*3/uL (ref 150–400)
RBC: 3.67 MIL/uL — ABNORMAL LOW (ref 3.87–5.11)
RDW: 14.9 % (ref 11.5–15.5)
WBC: 4.5 10*3/uL (ref 4.0–10.5)
nRBC: 0 % (ref 0.0–0.2)

## 2021-11-23 LAB — COMPREHENSIVE METABOLIC PANEL
ALT: 7 U/L (ref 0–44)
AST: 20 U/L (ref 15–41)
Albumin: 2.7 g/dL — ABNORMAL LOW (ref 3.5–5.0)
Alkaline Phosphatase: 55 U/L (ref 38–126)
Anion gap: 8 (ref 5–15)
BUN: 31 mg/dL — ABNORMAL HIGH (ref 8–23)
CO2: 26 mmol/L (ref 22–32)
Calcium: 9.2 mg/dL (ref 8.9–10.3)
Chloride: 107 mmol/L (ref 98–111)
Creatinine, Ser: 1.38 mg/dL — ABNORMAL HIGH (ref 0.44–1.00)
GFR, Estimated: 39 mL/min — ABNORMAL LOW (ref >60)
Glucose, Bld: 86 mg/dL (ref 70–99)
Potassium: 4.2 mmol/L (ref 3.5–5.1)
Sodium: 141 mmol/L (ref 135–145)
Total Bilirubin: 0.9 mg/dL (ref 0.3–1.2)
Total Protein: 5.9 g/dL — ABNORMAL LOW (ref 6.5–8.1)

## 2021-11-23 LAB — MAGNESIUM: Magnesium: 1.7 mg/dL (ref 1.7–2.4)

## 2021-11-23 LAB — TROPONIN I (HIGH SENSITIVITY)
Troponin I (High Sensitivity): 323 ng/L (ref <18)
Troponin I (High Sensitivity): 348 ng/L (ref <18)
Troponin I (High Sensitivity): 350 ng/L (ref <18)

## 2021-11-23 LAB — RESP PANEL BY RT-PCR (FLU A&B, COVID) ARPGX2
Influenza A by PCR: NEGATIVE
Influenza B by PCR: NEGATIVE
SARS Coronavirus 2 by RT PCR: NEGATIVE

## 2021-11-23 LAB — BRAIN NATRIURETIC PEPTIDE: B Natriuretic Peptide: 1910.5 pg/mL — ABNORMAL HIGH (ref 0.0–100.0)

## 2021-11-23 MED ORDER — HEPARIN (PORCINE) 25000 UT/250ML-% IV SOLN
750.0000 [IU]/h | INTRAVENOUS | Status: DC
  Administered 2021-11-23: 650 [IU]/h via INTRAVENOUS
  Filled 2021-11-23 (×2): qty 250

## 2021-11-23 MED ORDER — ATORVASTATIN CALCIUM 10 MG PO TABS
10.0000 mg | ORAL_TABLET | Freq: Every day | ORAL | Status: DC
  Administered 2021-11-24 – 2021-11-27 (×4): 10 mg via ORAL
  Filled 2021-11-23 (×5): qty 1

## 2021-11-23 MED ORDER — CITALOPRAM HYDROBROMIDE 10 MG PO TABS
10.0000 mg | ORAL_TABLET | Freq: Every day | ORAL | Status: DC
  Administered 2021-11-24 – 2021-11-28 (×5): 10 mg via ORAL
  Filled 2021-11-23 (×5): qty 1

## 2021-11-23 MED ORDER — ASPIRIN 325 MG PO TABS
325.0000 mg | ORAL_TABLET | Freq: Once | ORAL | Status: AC
  Administered 2021-11-23: 325 mg via ORAL
  Filled 2021-11-23: qty 1

## 2021-11-23 MED ORDER — ACETAMINOPHEN 650 MG RE SUPP: 650.0000 mg | Freq: Four times a day (QID) | RECTAL | Status: DC | PRN

## 2021-11-23 MED ORDER — FUROSEMIDE 10 MG/ML IJ SOLN
40.0000 mg | Freq: Two times a day (BID) | INTRAMUSCULAR | Status: DC
  Administered 2021-11-24: 40 mg via INTRAVENOUS
  Filled 2021-11-23 (×2): qty 4

## 2021-11-23 MED ORDER — CARVEDILOL 6.25 MG PO TABS
6.2500 mg | ORAL_TABLET | Freq: Two times a day (BID) | ORAL | Status: DC
  Administered 2021-11-24: 6.25 mg via ORAL
  Filled 2021-11-23 (×2): qty 1

## 2021-11-23 MED ORDER — ACETAMINOPHEN 325 MG PO TABS
650.0000 mg | ORAL_TABLET | Freq: Four times a day (QID) | ORAL | Status: DC | PRN
  Administered 2021-11-26 – 2021-11-27 (×2): 650 mg via ORAL
  Filled 2021-11-23 (×2): qty 2

## 2021-11-23 MED ORDER — FUROSEMIDE 10 MG/ML IJ SOLN
40.0000 mg | Freq: Once | INTRAMUSCULAR | Status: AC
Start: 2021-11-23 — End: 2021-11-23
  Administered 2021-11-23: 40 mg via INTRAVENOUS
  Filled 2021-11-23: qty 4

## 2021-11-23 MED ORDER — ATORVASTATIN CALCIUM 10 MG PO TABS: 10.0000 mg | ORAL_TABLET | Freq: Every day | ORAL | Status: DC

## 2021-11-23 MED ORDER — ISOSORBIDE MONONITRATE ER 30 MG PO TB24
15.0000 mg | ORAL_TABLET | Freq: Every day | ORAL | Status: DC
  Administered 2021-11-24 – 2021-11-25 (×2): 15 mg via ORAL
  Filled 2021-11-23 (×2): qty 1

## 2021-11-23 MED ORDER — HEPARIN SODIUM (PORCINE) 5000 UNIT/ML IJ SOLN
60.0000 [IU]/kg | Freq: Once | INTRAMUSCULAR | Status: AC
  Administered 2021-11-23: 3450 [IU] via INTRAVENOUS
  Filled 2021-11-23: qty 1

## 2021-11-23 NOTE — ED Provider Notes (Signed)
[facility] Provider Note   CSN: [id number] Arrival date & time: 11/23/21  1332     History  Chief Complaint  Patient presents with   Shortness of Breath    Brandy [name] is a 81 y.o. female.  81 year old female with past medical history significant for CVA with left-sided deficits, HFrEF with EF of 35-40% presents today for evaluation of ongoing shortness of breath worse over the weekend requiring constant supplemental O2 which is not usual for her.  She reports compliance with her home medications including Lasix, and Eliquis.  Currently she denies chest pain.  Does endorse abdominal discomfort however this has been ongoing for quite some time according the patient.  Patient is accompanied by her husband, and an at home caretaker.  The history is provided by the patient. No language interpreter was used.       Home Medications Prior to Admission medications   Medication Sig Start Date End Date Taking? Authorizing Provider  acetaminophen (TYLENOL) 325 MG tablet Take 2 tablets (650 mg total) by mouth every 4 (four) hours as needed for mild pain (or temp > 37.5 C (99.5 F)). Patient taking differently: Take 650 mg by mouth every 6 (six) hours as needed for mild pain or moderate pain. 03/16/18   Angiulli, Lavon Paganini, PA-C  atorvastatin (LIPITOR) 10 MG tablet Take 1 tablet (10 mg total) by mouth daily. 05/28/21   Skeet Latch, MD  carvedilol (COREG) 6.25 MG tablet Take 1 tablet (6.25 mg total) by mouth 2 (two) times daily with a meal. 09/22/21   Skeet Latch, MD  cetirizine (ZYRTEC) 10 MG tablet Take 10 mg by mouth daily.    [provider]  citalopram (CELEXA) 10 MG tablet Take 10 mg by mouth daily.    [provider]  COVID-19 mRNA Vac-TriS, Pfizer, (PFIZER-BIONT COVID-19 VAC-TRIS) SUSP injection Inject into the muscle. 10/29/20   Carlyle Basques, MD  ELIQUIS 2.5 MG TABS tablet Take 1 tablet (2.5 mg total) by mouth 2 (two)  times daily. 01/26/20   Johnson, Clanford L, MD  ferrous sulfate 324 MG TBEC Take 324 mg by mouth. M/W/F    [provider]  furosemide (LASIX) 20 MG tablet Take 1 tablet (20 mg total) by mouth daily. 09/22/21   Skeet Latch, MD  isosorbide mononitrate (IMDUR) 30 MG 24 hr tablet TAKE 1/2 TABLET(15 MG) BY MOUTH DAILY 05/01/21   Skeet Latch, MD  Melatonin 5 MG TABS Take by mouth.    [provider]  pantoprazole (PROTONIX) 40 MG tablet Take 1 tablet (40 mg total) by mouth daily before breakfast. 01/25/20   Murlean Iba, MD      Allergies    Lisinopril and Jardiance [empagliflozin]    Review of Systems   Review of Systems  Constitutional:  Negative for chills and fever.  Respiratory:  Positive for shortness of breath. Negative for cough.   Cardiovascular:  Negative for chest pain, palpitations and leg swelling.  Gastrointestinal:  Positive for abdominal pain. Negative for nausea and vomiting.  Neurological:  Negative for light-headedness.  All other systems reviewed and are negative.   Physical Exam Updated Vital Signs BP 119/78 (BP Location: Right Arm)   Pulse 90   Temp 98.6 F (37 C) (Oral)   Resp 18   Ht '5\' 4"'$  (1.626 m)   Wt 57.2 kg   SpO2 95%   BMI 21.63 kg/m  Physical Exam Vitals and nursing note reviewed.  Constitutional:  General: She is not in acute distress.    Appearance: Normal appearance. She is not ill-appearing.  HENT:     Head: Normocephalic and atraumatic.     Nose: Nose normal.  Eyes:     General: No scleral icterus.    Extraocular Movements: Extraocular movements intact.     Conjunctiva/sclera: Conjunctivae normal.  Cardiovascular:     Rate and Rhythm: Normal rate and regular rhythm.     Pulses: Normal pulses.  Pulmonary:     Effort: Pulmonary effort is normal. No respiratory distress.     Breath sounds: Normal breath sounds. No wheezing or rales.  Abdominal:     General: There is no distension.     Palpations:  Abdomen is soft.     Tenderness: There is no abdominal tenderness. There is no guarding.  Musculoskeletal:        General: Normal range of motion.     Cervical back: Normal range of motion.     Right lower leg: No edema.     Left lower leg: No edema.  Skin:    General: Skin is warm and dry.  Neurological:     General: No focal deficit present.     Mental Status: She is alert. Mental status is at baseline.     ED Results / Procedures / Treatments   Labs (all labs ordered are listed, but only abnormal results are displayed) Labs Reviewed  RESP PANEL BY RT-PCR (FLU A&B, COVID) ARPGX2  CBC WITH DIFFERENTIAL/PLATELET  BRAIN NATRIURETIC PEPTIDE  COMPREHENSIVE METABOLIC PANEL  MAGNESIUM  TROPONIN I (HIGH SENSITIVITY)    EKG None  Radiology No results found.  Procedures .Critical Care  Performed by: Evlyn Courier, PA-C Authorized by: Evlyn Courier, PA-C   Critical care provider statement:    Critical care time (minutes):  30   Critical care was necessary to treat or prevent imminent or life-threatening deterioration of the following conditions:  Cardiac failure   Critical care was time spent personally by me on the following activities:  Development of treatment plan with patient or surrogate, discussions with consultants, evaluation of patient's response to treatment, examination of patient, ordering and review of laboratory studies, ordering and review of radiographic studies, ordering and performing treatments and interventions, pulse oximetry, re-evaluation of patient's condition and review of old charts     Medications Ordered in ED Medications - No data to display  ED Course/ Medical Decision Making/ A&P Clinical Course as of 11/23/21 [place] Nov 23, 2021  1920 Discussed with Dr. Marlou Porch who recommends admission to hospitalist service and they will evaluate patient tomorrow.  They agree with aspirin, Lasix, heparin drip. [AA]    Clinical Course User Index [AA] Evlyn Courier, PA-C                           Medical Decision Making Amount and/or Complexity of Data Reviewed Labs: ordered. Radiology: ordered.  Risk OTC drugs. Prescription drug management.   Medical Decision Making / ED Course   This patient presents to the ED for concern of shortness of breath, this involves an extensive number of treatment options, and is a complaint that carries with it a high risk of complications and morbidity.  The differential diagnosis includes ACS, PE, pneumonia, CHF exacerbation  MDM: 81 year old female with past medical history as mentioned above presents today for evaluation of shortness of breath and requiring constant supplemental O2.  She does wear supplemental O2  on an as-needed basis at home but does not require this constantly.  Over the weekend she has had to persistently wear this.  Per at home caretaker who is at bedside they report O2 sats as low as 83% at home.  Recent echo done June 20 10/2021 showed worsening EF to 20-25%, reduction in RV function, and thickening and stiffness of the heart muscle.  Concern for amyloidosis.  Cardiac MRI is scheduled on an outpatient basis.  Will evaluate patient with blood work, chest x-ray for acute cause of hypoxia/shortness of breath. Chest x-ray shows signs of significant volume overload.  CBC shows mild anemia but not significantly lower than her baseline.  Without leukocytosis.  CMP shows creatinine 1.38 otherwise without acute findings.  COVID and flu negative.  Magnesium 1.7.  Initial troponin of 323.  BNP still pending.  Will consult cardiology.  Start heparin, give dose of aspirin.  40 mg IVP Lasix ordered.  Lab Tests: -I ordered, reviewed, and interpreted labs.   The pertinent results include:   Labs Reviewed  RESP PANEL BY RT-PCR (FLU A&B, COVID) ARPGX2  CBC WITH DIFFERENTIAL/PLATELET  BRAIN NATRIURETIC PEPTIDE  COMPREHENSIVE METABOLIC PANEL  MAGNESIUM  TROPONIN I (HIGH SENSITIVITY)      EKG  EKG  Interpretation  Date/Time:    Ventricular Rate:    PR Interval:    QRS Duration:   QT Interval:    QTC Calculation:   R Axis:     Text Interpretation:           Imaging Studies ordered: I ordered imaging studies including CXR I independently visualized and interpreted imaging. I agree with the radiologist interpretation   Medicines ordered and prescription drug management: No orders of the defined types were placed in this encounter.   -I have reviewed the patients home medicines and have made adjustments as needed  Critical interventions Heparin drip, IVP Lasix, aspirin  Consultations Obtained: I requested consultation with the cardiology,  and discussed lab and imaging findings as well as pertinent plan - they recommend: As above   Cardiac Monitoring: The patient was maintained on a cardiac monitor.  I personally viewed and interpreted the cardiac monitored which showed an underlying rhythm of: Normal sinus rhythm  Reevaluation: After the interventions noted above, I reevaluated the patient and found that they have :stayed the same  Co morbidities that complicate the patient evaluation  Past Medical History:  Diagnosis Date   Cardiomyopathy    a. EF of 25% in 07/2006 b. EF normalized by repeat echo in 2011 c. EF 35-40% by echo in 05/2017 with cath showing mild nonobstructive CAD   Chronic combined systolic and diastolic CHF (congestive heart failure) (HCC)    Chronic combined systolic and diastolic heart failure ([place])    Colon polyps    Diverticulosis of colon    Hypertension    Hypertensive heart disease    Osteopenia    Paroxysmal atrial fibrillation ([place]) 10/10/2018   Stroke ([place])    TIA (transient ischemic attack)    Tobacco abuse    50 pack year      Dispostion: Patient at the end of my shift is awaiting hospitalist callback for admission.  Patient signed out to Dr. Pearline Cables.  Final Clinical Impression(s) / ED Diagnoses Final diagnoses:  ACS (acute  coronary syndrome) ([place])  Acute on chronic congestive heart failure, unspecified heart failure type ([place])    Rx / DC Orders ED Discharge Orders     None  Evlyn Courier, PA-C 11/23/21 [place], Wilson A, DO 11/27/21 [phone]

## 2021-11-23 NOTE — H&P (Signed)
History and Physical    Brandy [name] SEG:[id number] DOB: 01/22/1941 DOA: 11/23/2021  PCP: [name], MD  Patient coming from: Home.  Chief Complaint: Shortness of breath.  HPI: Brandy [name] is a 81 y.o. female with history of CVA with left-sided hemiplegia bedbound, chronic systolic and diastolic CHF last EF admission was in June 2023 showed EF of 20 to 25% with grade 3 diastolic dysfunction, paroxysmal atrial fibrillation was brought to the ER after patient was found to be increasingly short of the last few days.  Did not have any chest pain productive cough fever or chills.  ED Course: In the ER chest x-ray showed bilateral pleural effusion and features concerning for fluid overload.  Patient's high sensitive troponin was 323.  Cardiology on-call was discussed with by ER physician.  Patient was started on heparin and Lasix admitted for further work-up.  Review of Systems: As per HPI, rest all negative.   Past Medical History:  Diagnosis Date   Cardiomyopathy    a. EF of 25% in 07/2006 b. EF normalized by repeat echo in 2011 c. EF 35-40% by echo in 05/2017 with cath showing mild nonobstructive CAD   Chronic combined systolic and diastolic CHF (congestive heart failure) (HCC)    Chronic combined systolic and diastolic heart failure (HCC)    Colon polyps    Diverticulosis of colon    Hypertension    Hypertensive heart disease    Osteopenia    Paroxysmal atrial fibrillation ([place]) 10/10/2018   Stroke ([place])    TIA (transient ischemic attack)    Tobacco abuse    50 pack year    Past Surgical History:  Procedure Laterality Date   BREAST BIOPSY     CARPAL TUNNEL RELEASE     right hand   CERVICAL FUSION     CESAREAN SECTION     2 times   COLONOSCOPY  7371   pt uncertain as to whether polypectomy required or performed   COLONOSCOPY WITH PROPOFOL N/A 01/23/2020   Procedure: COLONOSCOPY WITH PROPOFOL;  Surgeon: Rogene Houston, MD;  [facility];  Service:  Endoscopy;  Laterality: N/A;   ECTOPIC PREGNANCY SURGERY     ESOPHAGOGASTRODUODENOSCOPY (EGD) WITH PROPOFOL N/A 10/13/2018   Procedure: ESOPHAGOGASTRODUODENOSCOPY (EGD) WITH PROPOFOL;  Surgeon: Aviva Signs, MD;  Location: [facility];  Service: General;  Laterality: N/A;   ESOPHAGOGASTRODUODENOSCOPY (EGD) WITH PROPOFOL N/A 01/23/2020   Procedure: ESOPHAGOGASTRODUODENOSCOPY (EGD) WITH PROPOFOL;  Surgeon: Rogene Houston, MD;  Location: [facility];  Service: Endoscopy;  Laterality: N/A;   IR ANGIO VERTEBRAL SEL SUBCLAVIAN INNOMINATE UNI R MOD SED  02/10/2018   IR CT HEAD LTD  02/10/2018   IR PERCUTANEOUS ART THROMBECTOMY/INFUSION INTRACRANIAL INC DIAG ANGIO  02/10/2018   PEG PLACEMENT N/A 10/13/2018   Procedure: PERCUTANEOUS ENDOSCOPIC GASTROSTOMY (PEG) PLACEMENT;  Surgeon: Aviva Signs, MD;  Location: [facility];  Service: General;  Laterality: N/A;   PEG PLACEMENT N/A 12/04/2019   Procedure: PERCUTANEOUS ENDOSCOPIC GASTROSTOMY (PEG) REPLACEMENT;  Surgeon: Aviva Signs, MD;  Location: [facility];  Service: Gastroenterology;  Laterality: N/A;   PEG PLACEMENT N/A 12/12/2019   Procedure: PERCUTANEOUS ENDOSCOPIC GASTROSTOMY (PEG) REPLACEMENT;  Surgeon: Aviva Signs, MD;  Location: [facility];  Service: Gastroenterology;  Laterality: N/A;   POLYPECTOMY  01/23/2020   Procedure: POLYPECTOMY;  Surgeon: Rogene Houston, MD;  Location: [facility];  Service: Endoscopy;;   RADIOLOGY WITH ANESTHESIA N/A 02/10/2018   Procedure: RADIOLOGY WITH ANESTHESIA;  Surgeon: Estanislado Pandy,  Willaim Rayas, MD;  Location: [facility];  Service: Radiology;  Laterality: N/A;   RIGHT/LEFT HEART CATH AND CORONARY ANGIOGRAPHY N/A 06/03/2017   Procedure: RIGHT/LEFT HEART CATH AND CORONARY ANGIOGRAPHY;  Surgeon: Jettie Booze, MD;  Location: [facility];  Service: Cardiovascular;  Laterality: N/A;   TEE WITHOUT CARDIOVERSION N/A 02/13/2018   Procedure: TRANSESOPHAGEAL ECHOCARDIOGRAM (TEE);  Surgeon: Sueanne Margarita, MD;   Location: [facility] ENDOSCOPY;  Service: Cardiovascular;  Laterality: N/A;   TONSILLECTOMY       reports that she quit smoking about 13 years ago. Her smoking use included cigarettes. She started smoking about 61 years ago. She has a 50.00 pack-year smoking history. She has never used smokeless tobacco. She reports that she does not drink alcohol and does not use drugs.  Allergies  Allergen Reactions   Lisinopril Swelling   Jardiance [Empagliflozin] Rash    Family History  Problem Relation Age of Onset   Hypertension Mother    Hypertension Father    Hypertension Brother    Hypertension Maternal Grandmother    Stroke Maternal Grandmother    Cancer Paternal Grandmother    Colon cancer Neg Hx     Prior to Admission medications   Medication Sig Start Date End Date Taking? Authorizing Provider  acetaminophen (TYLENOL) 325 MG tablet Take 2 tablets (650 mg total) by mouth every 4 (four) hours as needed for mild pain (or temp > 37.5 C (99.5 F)). Patient taking differently: Take 650 mg by mouth every 6 (six) hours as needed for mild pain or moderate pain. 03/16/18  Yes Angiulli, Lavon Paganini, PA-C  ascorbic acid (VITAMIN C) 500 MG tablet Take 500 mg by mouth daily.   Yes [provider]  atorvastatin (LIPITOR) 10 MG tablet Take 1 tablet (10 mg total) by mouth daily. 05/28/21  Yes Skeet Latch, MD  augmented betamethasone dipropionate (DIPROLENE-AF) 0.05 % ointment Apply 1 Application topically daily as needed (For rash). 11/16/21  Yes [provider]  carvedilol (COREG) 6.25 MG tablet Take 1 tablet (6.25 mg total) by mouth 2 (two) times daily with a meal. 09/22/21  Yes Skeet Latch, MD  cetirizine (ZYRTEC) 10 MG tablet Take 10 mg by mouth daily as needed for allergies.   Yes [provider]  citalopram (CELEXA) 10 MG tablet Take 10 mg by mouth daily.   Yes [provider]  ELIQUIS 2.5 MG TABS tablet Take 1 tablet (2.5 mg total) by mouth 2 (two) times daily.  01/26/20  Yes Johnson, Clanford L, MD  furosemide (LASIX) 20 MG tablet Take 1 tablet (20 mg total) by mouth daily. 09/22/21  Yes Skeet Latch, MD  isosorbide mononitrate (IMDUR) 30 MG 24 hr tablet TAKE 1/2 TABLET(15 MG) BY MOUTH DAILY Patient taking differently: Take 15 mg by mouth daily. 05/01/21  Yes Skeet Latch, MD  COVID-19 mRNA Vac-TriS, Pfizer, (PFIZER-BIONT COVID-19 VAC-TRIS) SUSP injection Inject into the muscle. 10/29/20   Carlyle Basques, MD  pantoprazole (PROTONIX) 40 MG tablet Take 1 tablet (40 mg total) by mouth daily before breakfast. Patient not taking: Reported on 11/23/2021 01/25/20   Murlean Iba, MD    Physical Exam: Constitutional: Moderately built and nourished. Vitals:   11/23/21 1830 11/23/21 1900 11/23/21 1930 11/23/21 2000  BP: (!) 115/90 110/80 116/83 118/81  Pulse: 90 93 92 94  Resp: '20 19 17 17  '$ Temp:      TempSrc:      SpO2: 100% 100% 100% 99%  Weight:      Height:  Eyes: Anicteric no pallor. ENMT: No discharge from the ears eyes nose and mouth. Neck: No mass felt.  No neck rigidity.  JVD elevated. Respiratory: No rhonchi or crepitations. Cardiovascular: S1-S2 heard. Abdomen: Soft nontender bowel sound present. Musculoskeletal: No edema. Skin: No rash. Neurologic: Alert awake oriented to her name minimally verbal.  Left-sided hemiplegia. Psychiatric: Minimally verbal.   Labs on Admission: I have personally reviewed following labs and imaging studies  CBC: Recent Labs  Lab 11/23/21 1707  WBC 4.5  NEUTROABS 2.9  HGB 10.7*  HCT 35.0*  MCV 95.4  PLT 834   Basic Metabolic Panel: Recent Labs  Lab 11/23/21 1707  NA 141  K 4.2  CL 107  CO2 26  GLUCOSE 86  BUN 31*  CREATININE 1.38*  CALCIUM 9.2  MG 1.7   GFR: Estimated Creatinine Clearance: 28.5 mL/min (A) (by C-G formula based on SCr of 1.38 mg/dL (H)). Liver Function Tests: Recent Labs  Lab 11/23/21 1707  AST 20  ALT 7  ALKPHOS 55  BILITOT 0.9  PROT 5.9*   ALBUMIN 2.7*   No results for input(s): "LIPASE", "AMYLASE" in the last 168 hours. No results for input(s): "AMMONIA" in the last 168 hours. Coagulation Profile: No results for input(s): "INR", "PROTIME" in the last 168 hours. Cardiac Enzymes: No results for input(s): "CKTOTAL", "CKMB", "CKMBINDEX", "TROPONINI" in the last 168 hours. BNP (last 3 results) No results for input(s): "PROBNP" in the last 8760 hours. HbA1C: No results for input(s): "HGBA1C" in the last 72 hours. CBG: No results for input(s): "GLUCAP" in the last 168 hours. Lipid Profile: No results for input(s): "CHOL", "HDL", "LDLCALC", "TRIG", "CHOLHDL", "LDLDIRECT" in the last 72 hours. Thyroid Function Tests: No results for input(s): "TSH", "T4TOTAL", "FREET4", "T3FREE", "THYROIDAB" in the last 72 hours. Anemia Panel: No results for input(s): "VITAMINB12", "FOLATE", "FERRITIN", "TIBC", "IRON", "RETICCTPCT" in the last 72 hours. Urine analysis:    Component Value Date/Time   COLORURINE YELLOW 10/10/2018 1951   APPEARANCEUR CLEAR 10/10/2018 1951   LABSPEC 1.014 10/10/2018 1951   PHURINE 5.0 10/10/2018 1951   GLUCOSEU NEGATIVE 10/10/2018 1951   HGBUR NEGATIVE 10/10/2018 [place] NEGATIVE 10/10/2018 [place] NEGATIVE 10/10/2018 1951   PROTEINUR NEGATIVE 10/10/2018 1951   NITRITE NEGATIVE 10/10/2018 1951   LEUKOCYTESUR NEGATIVE 10/10/2018 1951   Sepsis Labs: '@LABRCNTIP'$ (procalcitonin:4,lacticidven:4) ) Recent Results (from the past 240 hour(s))  Resp Panel by RT-PCR (Flu A&B, Covid) Anterior Nasal Swab     Status: None   Collection Time: 11/23/21  3:26 PM   Specimen: Anterior Nasal Swab  Result Value Ref Range Status   SARS Coronavirus 2 by RT PCR NEGATIVE NEGATIVE Final    Comment: (NOTE) SARS-CoV-2 target nucleic acids are NOT DETECTED.  The SARS-CoV-2 RNA is generally detectable in upper respiratory specimens during the acute phase of infection. The lowest concentration of SARS-CoV-2  viral copies this assay can detect is 138 copies/mL. A negative result does not preclude SARS-Cov-2 infection and should not be used as the sole basis for treatment or other patient management decisions. A negative result may occur with  improper specimen collection/handling, submission of specimen other than nasopharyngeal swab, presence of viral mutation(s) within the areas targeted by this assay, and inadequate number of viral copies(<138 copies/mL). A negative result must be combined with clinical observations, patient history, and epidemiological information. The expected result is Negative.  Fact Sheet for Patients:  EntrepreneurPulse.com.au  Fact Sheet for Healthcare Providers:  IncredibleEmployment.be  This test is no  t yet approved or cleared by the [place] and  has been authorized for detection and/or diagnosis of SARS-CoV-2 by FDA under an Emergency Use Authorization (EUA). This EUA will remain  in effect (meaning this test can be used) for the duration of the COVID-19 declaration under Section 564(b)(1) of the Act, 21 U.S.C.section 360bbb-3(b)(1), unless the authorization is terminated  or revoked sooner.       Influenza A by PCR NEGATIVE NEGATIVE Final   Influenza B by PCR NEGATIVE NEGATIVE Final    Comment: (NOTE) The Xpert Xpress SARS-CoV-2/FLU/RSV plus assay is intended as an aid in the diagnosis of influenza from Nasopharyngeal swab specimens and should not be used as a sole basis for treatment. Nasal washings and aspirates are unacceptable for Xpert Xpress SARS-CoV-2/FLU/RSV testing.  Fact Sheet for Patients: EntrepreneurPulse.com.au  Fact Sheet for Healthcare Providers: IncredibleEmployment.be  This test is not yet approved or cleared by the [place] FDA and has been authorized for detection and/or diagnosis of SARS-CoV-2 by FDA under an Emergency Use Authorization  (EUA). This EUA will remain in effect (meaning this test can be used) for the duration of the COVID-19 declaration under Section 564(b)(1) of the Act, 21 U.S.C. section 360bbb-3(b)(1), unless the authorization is terminated or revoked.  Performed at [facility], [place] [address] [place],  [address]      Radiological Exams on Admission: DG Chest Portable 1 View  Result Date: 11/23/2021 CLINICAL DATA:  Dyspnea. Worsening shortness of breath over the last several days. EXAM: PORTABLE CHEST 1 VIEW COMPARISON:  Radiographs 01/29/2020 and 01/21/2020.  CT 09/14/2017. FINDINGS: 1532 hours. The lower left chest is partially excluded from this portable examination. The heart is enlarged but stable. There is aortic atherosclerosis. Moderate bilateral pleural effusions with pulmonary edema, similar to prior radiographs. No evidence of pneumothorax. Previous cervical fusion without evidence of acute osseous abnormality. IMPRESSION: Cardiomegaly, pulmonary edema and bilateral pleural effusions consistent with recurrent congestive heart failure. Electronically Signed   By: Richardean Sale M.D.   On: 11/23/2021 15:42    EKG: Independently reviewed.  Normal sinus rhythm.  Assessment/Plan Principal Problem:   Acute CHF (congestive heart failure) (HCC) Active Problems:   HTN (hypertension)   Elevated troponin   CKD (chronic kidney disease) stage 3, GFR 30-59 ml/min (HCC)   Hemiparesis affecting left side as late effect of stroke (HCC)   PAF (paroxysmal atrial fibrillation) (HCC)    Acute on chronic systolic and diastolic CHF last EF measured in June 2023 was 20 to 25% with grade 3 diastolic dysfunction presently on Lasix 40 mg IV q. 12.  Closely follow intake output and metabolic panel.  Not on ACE inhibitors or ARB due to renal failure. Elevated troponin likely from CHF.  Denies any chest pain.  Will trend cardiac markers patient on heparin and statins. History of stroke with left-sided  hemiplegia bedbound on Eliquis presently on heparin.  Takes statins. Chronic kidney disease stage III creatinine appears to be at baseline. Paroxysmal atrial fibrillation usually takes Eliquis presently on heparin. Hypertension on amlodipine. Chronic anemia likely from renal disease follow CBC.  Since patient has acute CHF will need close monitoring and inpatient status.   DVT prophylaxis: Heparin infusion. Code Status: Full code. Family Communication: Husband. Disposition Plan: Home when stable. Consults called: ER physician discussed with cardiologist. Admission status: Inpatient   Rise Patience MD [facility] Pager [phone].  If 7PM-7AM, please contact night-coverage [email or web address] Password [facility]  11/23/2021, 8:54 PM

## 2021-11-23 NOTE — Telephone Encounter (Signed)
Returned call to patient's home, asked for patient, her daughter states her dad to the patient to the hospital. States her mom's breathing became labored, and oxygen sats without 02 were in the 14s. Writer notified Laurann Montana Np.  Georgana Curio MHA RN CM

## 2021-11-23 NOTE — ED Triage Notes (Signed)
Reports sob since the weekend but has gotten worse.  Patient typically wears oxygen as needed but has required it the whole weekend.

## 2021-11-23 NOTE — Progress Notes (Signed)
ANTICOAGULATION CONSULT NOTE - Initial Consult  Pharmacy Consult for Heparin infusion Indication: chest pain/ACS  Allergies  Allergen Reactions   Lisinopril Swelling   Jardiance [Empagliflozin] Rash    Patient Measurements: Height: '5\' 4"'$  (162.6 cm) Weight: 57.2 kg (126 lb) IBW/kg (Calculated) : 54.7 Heparin Dosing Weight: 57.2 kg  Vital Signs: Temp: 98.5 F (36.9 C) (08/28 1706) Temp Source: Oral (08/28 1706) BP: 110/80 (08/28 1900) Pulse Rate: 93 (08/28 1900)  Labs: Recent Labs    11/23/21 1707  HGB 10.7*  HCT 35.0*  PLT 201  CREATININE 1.38*  TROPONINIHS 323*    Estimated Creatinine Clearance: 28.5 mL/min (A) (by C-G formula based on SCr of 1.38 mg/dL (H)).   Medical History: Past Medical History:  Diagnosis Date   Cardiomyopathy    a. EF of 25% in 07/2006 b. EF normalized by repeat echo in 2011 c. EF 35-40% by echo in 05/2017 with cath showing mild nonobstructive CAD   Chronic combined systolic and diastolic CHF (congestive heart failure) (HCC)    Chronic combined systolic and diastolic heart failure (HCC)    Colon polyps    Diverticulosis of colon    Hypertension    Hypertensive heart disease    Osteopenia    Paroxysmal atrial fibrillation ([place]) 10/10/2018   Stroke ([place])    TIA (transient ischemic attack)    Tobacco abuse    50 pack year    Medications:  (Not in a hospital admission)   Assessment: 81 yo F presented with worsening SOB, despite increased home O2 requirements. Pt takes apixaban 2.'5mg'$  po BID prior to admission for paroxysmal Afib, and h/o stroke. Pharmacy consulted to dose heparin infusion per ACS protocol.   Hgb 10.7, Plt 201 Troponin 323  Last dose of apixaban - 8/28 at ~9:30 AM  Goal of Therapy:  Heparin level 0.3-0.7 units/ml aPTT 66-102 seconds Monitor platelets by anticoagulation protocol: Yes   Plan:  Heparin 3540 unit bolus x1 followed by  Heparin infusion at 650 units/hr  Check aPTT and heparin level in 8  hours Monitor CBC, aPTT, heparin level, and for s/sx of bleeding daily.   Luisa Hart, PharmD, BCPS Clinical Pharmacist 11/23/2021 7:11 PM   Please refer to AMION for pharmacy phone number

## 2021-11-23 NOTE — Telephone Encounter (Signed)
Stop Carvedilol. Start Toprol '50mg'$  daily.   Recommend check BP, rate, HR once per day and send [place] a log in about a week. She has upcoming visit early September.   Loel Dubonnet, NP

## 2021-11-23 NOTE — Telephone Encounter (Signed)
STAT if HR is under 50 or over 120 (normal HR is 60-100 beats per minute)  What is your heart rate? 110  Do you have a log of your heart rate readings (document readings)? Over the weekend was in the 130's  Do you have any other symptoms? Elevated HR, daughter states she has noticed that her mother has been heavier.  She check her HR over the weekend and it was in the 130's.  There nurse that visits her checked it today and it was 110.

## 2021-11-23 NOTE — ED Provider Notes (Signed)
  Provider Note MRN:  [id number]  Arrival date & time: 11/23/21    ED Course and Medical Decision Making  Assumed care from [place]/Ali - PA at shift change.  See note from prior team for complete details, in brief:  81 yo female  Follows with Dr Oval Linsey cardio To ED with dib, worsened from baseline Compliant with lasix/eliquis BNP 1900, Trop 323, Na 141, Cr 1.38 (similar to baseline) Increased O2 requirement, she is on PRN at home but has been using it constantly last 48 hours (2LPM [place]) Prior team d/w Dr Marlou Porch, recommends medical admission with  cardiology on consult Started on heparin, lasix, asa >> dib improving following diuresis  Concern for CHF exacerbation with elevated trop, favor this to be demand ischemia.  D/w hospitalist Dr Hal Hope who accepts pt for admission   .Critical Care  Performed by: Jeanell Sparrow, DO Authorized by: Jeanell Sparrow, DO   Critical care provider statement:    Critical care time (minutes):  30   Critical care time was exclusive of:  Separately billable procedures and treating other patients   Critical care was necessary to treat or prevent imminent or life-threatening deterioration of the following conditions:  Cardiac failure and respiratory failure   Critical care was time spent personally by me on the following activities:  Development of treatment plan with patient or surrogate, discussions with consultants, evaluation of patient's response to treatment, examination of patient, ordering and review of laboratory studies, ordering and review of radiographic studies, ordering and performing treatments and interventions, pulse oximetry, re-evaluation of patient's condition, review of old charts and obtaining history from patient or surrogate   Care discussed with: admitting provider     Final Clinical Impressions(s) / ED Diagnoses     ICD-10-CM   1. Acute on chronic congestive heart failure, unspecified heart failure type ([place])  I50.9      2. Elevated troponin  R77.8     3. Hypoxia  R09.02       ED Discharge Orders     None       Discharge Instructions   None        Jeanell Sparrow, DO 11/23/21 1944

## 2021-11-24 DIAGNOSIS — N1832 Chronic kidney disease, stage 3b: Secondary | ICD-10-CM

## 2021-11-24 DIAGNOSIS — I69354 Hemiplegia and hemiparesis following cerebral infarction affecting left non-dominant side: Secondary | ICD-10-CM | POA: Diagnosis not present

## 2021-11-24 DIAGNOSIS — I10 Essential (primary) hypertension: Secondary | ICD-10-CM | POA: Diagnosis not present

## 2021-11-24 DIAGNOSIS — I509 Heart failure, unspecified: Secondary | ICD-10-CM | POA: Diagnosis not present

## 2021-11-24 DIAGNOSIS — I480 Paroxysmal atrial fibrillation: Secondary | ICD-10-CM

## 2021-11-24 LAB — BASIC METABOLIC PANEL
Anion gap: 9 (ref 5–15)
BUN: 31 mg/dL — ABNORMAL HIGH (ref 8–23)
CO2: 27 mmol/L (ref 22–32)
Calcium: 9.1 mg/dL (ref 8.9–10.3)
Chloride: 107 mmol/L (ref 98–111)
Creatinine, Ser: 1.45 mg/dL — ABNORMAL HIGH (ref 0.44–1.00)
GFR, Estimated: 37 mL/min — ABNORMAL LOW (ref >60)
Glucose, Bld: 82 mg/dL (ref 70–99)
Potassium: 3.5 mmol/L (ref 3.5–5.1)
Sodium: 143 mmol/L (ref 135–145)

## 2021-11-24 LAB — CBC
HCT: 32.1 % — ABNORMAL LOW (ref 36.0–46.0)
Hemoglobin: 10.4 g/dL — ABNORMAL LOW (ref 12.0–15.0)
MCH: 29.3 pg (ref 26.0–34.0)
MCHC: 32.4 g/dL (ref 30.0–36.0)
MCV: 90.4 fL (ref 80.0–100.0)
Platelets: 187 10*3/uL (ref 150–400)
RBC: 3.55 MIL/uL — ABNORMAL LOW (ref 3.87–5.11)
RDW: 14.8 % (ref 11.5–15.5)
WBC: 4.2 10*3/uL (ref 4.0–10.5)
nRBC: 0 % (ref 0.0–0.2)

## 2021-11-24 LAB — MAGNESIUM: Magnesium: 1.6 mg/dL — ABNORMAL LOW (ref 1.7–2.4)

## 2021-11-24 LAB — APTT
aPTT: 58 seconds — ABNORMAL HIGH (ref 24–36)
aPTT: 65 seconds — ABNORMAL HIGH (ref 24–36)

## 2021-11-24 LAB — HEPARIN LEVEL (UNFRACTIONATED)
Heparin Unfractionated: >1.1 IU/mL — ABNORMAL HIGH (ref 0.30–0.70)
Heparin Unfractionated: >1.1 IU/mL — ABNORMAL HIGH (ref 0.30–0.70)

## 2021-11-24 LAB — TSH: TSH: 0.89 u[IU]/mL (ref 0.350–4.500)

## 2021-11-24 LAB — TROPONIN I (HIGH SENSITIVITY): Troponin I (High Sensitivity): 364 ng/L (ref <18)

## 2021-11-24 MED ORDER — ENOXAPARIN SODIUM 40 MG/0.4ML IJ SOSY
40.0000 mg | PREFILLED_SYRINGE | INTRAMUSCULAR | Status: DC
Start: 2021-11-25 — End: 2021-11-24

## 2021-11-24 MED ORDER — APIXABAN 2.5 MG PO TABS
2.5000 mg | ORAL_TABLET | Freq: Two times a day (BID) | ORAL | Status: DC
Start: 2021-11-24 — End: 2021-11-26
  Administered 2021-11-24 – 2021-11-25 (×3): 2.5 mg via ORAL
  Filled 2021-11-24 (×3): qty 1

## 2021-11-24 MED ORDER — DICLOFENAC SODIUM 1 % EX GEL
2.0000 g | Freq: Four times a day (QID) | CUTANEOUS | Status: DC
  Administered 2021-11-24 – 2021-11-28 (×12): 2 g via TOPICAL
  Filled 2021-11-24: qty 100

## 2021-11-24 MED ORDER — METOPROLOL SUCCINATE ER 25 MG PO TB24
25.0000 mg | ORAL_TABLET | Freq: Every day | ORAL | Status: DC
  Administered 2021-11-25: 25 mg via ORAL
  Filled 2021-11-24: qty 1

## 2021-11-24 NOTE — Assessment & Plan Note (Signed)
As outpatient patient was changed from carvedilol to metoprolol due to bradycardia.  Plan to continue telemetry monitoring and anticoagulation,

## 2021-11-24 NOTE — Assessment & Plan Note (Addendum)
Continue blood pressure control with metoprolol and isosorbide.

## 2021-11-24 NOTE — Assessment & Plan Note (Addendum)
08/2021 echocardiogram with reduced LV systolic function 20 to 89%, with sever LVH, suggesting cardiac amyloidosis. RV systolic function with moderate reduction. Severe dilatation of left atrium. Small pericardial effusion.   Not documented urine output.  Systolic blood pressure 98 to 118 mmHg. Clinically her volume has improved.   Will hold on furosemide for now and re evaluate in am.  Continue with metoprolol and isosorbide.   Elevation in high sensitive troponin not consistent with acute coronary syndrome. Discontinue heparin.

## 2021-11-24 NOTE — Progress Notes (Signed)
ANTICOAGULATION CONSULT NOTE - Initial Consult  Pharmacy Consult for Heparin infusion>>apixaban Indication: chest pain/ACS  Allergies  Allergen Reactions   Lisinopril Swelling   Jardiance [Empagliflozin] Rash    Patient Measurements: Height: '5\' 4"'$  (162.6 cm) Weight: 60.8 kg (134 lb 0.6 oz) IBW/kg (Calculated) : 54.7 Heparin Dosing Weight: 57.2 kg  Vital Signs: Temp: 98.2 F (36.8 C) (08/29 0732) Temp Source: Oral (08/29 0732) BP: 96/71 (08/29 1619) Pulse Rate: 99 (08/29 0732)  Labs: Recent Labs    11/23/21 1707 11/23/21 1921 11/23/21 2145 11/23/21 2252 11/24/21 0419 11/24/21 1501  HGB 10.7*  --   --   --  10.4*  --   HCT 35.0*  --   --   --  32.1*  --   PLT 201  --   --   --  187  --   APTT  --   --   --   --  58* 65*  HEPARINUNFRC  --   --   --   --  >1.10* >1.10*  CREATININE 1.38*  --   --   --  1.45*  --   TROPONINIHS 323* 348* 350* 364*  --   --      Estimated Creatinine Clearance: 27.2 mL/min (A) (by C-G formula based on SCr of 1.45 mg/dL (H)).   Medical History: Past Medical History:  Diagnosis Date   Cardiomyopathy    a. EF of 25% in 07/2006 b. EF normalized by repeat echo in 2011 c. EF 35-40% by echo in 05/2017 with cath showing mild nonobstructive CAD   Chronic combined systolic and diastolic CHF (congestive heart failure) (HCC)    Chronic combined systolic and diastolic heart failure (HCC)    Colon polyps    Diverticulosis of colon    Hypertension    Hypertensive heart disease    Osteopenia    Paroxysmal atrial fibrillation ([place]) 10/10/2018   Stroke ([place])    TIA (transient ischemic attack)    Tobacco abuse    50 pack year    Medications:  Medications Prior to Admission  Medication Sig Dispense Refill Last Dose   acetaminophen (TYLENOL) 325 MG tablet Take 2 tablets (650 mg total) by mouth every 4 (four) hours as needed for mild pain (or temp > 37.5 C (99.5 F)). (Patient taking differently: Take 650 mg by mouth every 6 (six) hours as needed  for mild pain or moderate pain.)   Past Week   ascorbic acid (VITAMIN C) 500 MG tablet Take 500 mg by mouth daily.   11/23/2021   atorvastatin (LIPITOR) 10 MG tablet Take 1 tablet (10 mg total) by mouth daily. 90 tablet 1 11/22/2021   augmented betamethasone dipropionate (DIPROLENE-AF) 0.05 % ointment Apply 1 Application topically daily as needed (For rash).   11/22/2021   carvedilol (COREG) 6.25 MG tablet Take 1 tablet (6.25 mg total) by mouth 2 (two) times daily with a meal. 180 tablet 3 11/23/2021 at 1000   cetirizine (ZYRTEC) 10 MG tablet Take 10 mg by mouth daily as needed for allergies.   Past Month   citalopram (CELEXA) 10 MG tablet Take 10 mg by mouth daily.   11/23/2021   ELIQUIS 2.5 MG TABS tablet Take 1 tablet (2.5 mg total) by mouth 2 (two) times daily. 60 tablet  11/23/2021 at 1000   furosemide (LASIX) 20 MG tablet Take 1 tablet (20 mg total) by mouth daily. 90 tablet 3 11/23/2021   isosorbide mononitrate (IMDUR) 30 MG 24 hr tablet TAKE 1/2  TABLET(15 MG) BY MOUTH DAILY (Patient taking differently: Take 15 mg by mouth daily.) 45 tablet 2 11/23/2021   COVID-19 mRNA Vac-TriS, Pfizer, (PFIZER-BIONT COVID-19 VAC-TRIS) SUSP injection Inject into the muscle. 0.3 mL 0    pantoprazole (PROTONIX) 40 MG tablet Take 1 tablet (40 mg total) by mouth daily before breakfast. (Patient not taking: Reported on 11/23/2021) 30 tablet 1 Not Taking    Assessment: 81 yo F presented with worsening SOB, despite increased home O2 requirements. Pt takes apixaban 2.'5mg'$  po BID prior to admission for paroxysmal Afib, and h/o stroke. Pharmacy consulted to restart apixaban.   Hgb 10.7, Plt 201 Troponin 323  Last dose of apixaban - 8/28 at ~9:30 AM  Goal of Therapy:  Monitor platelets by anticoagulation protocol: Yes   Plan:  Apixaban 2.5 mg PO now and BID D/c heparin once apixaban dose given   Lynden Carrithers A. Levada Dy, PharmD, BCPS, FNKF Clinical Pharmacist [place] Please utilize Amion for appropriate phone number  to reach the unit pharmacist ([place])

## 2021-11-24 NOTE — Assessment & Plan Note (Addendum)
Renal function with serum cr at 1,45 with K at 3,5 and serum bicarbonate at 27, Mg is 1,6  Plan to add 2 g Mag sulfate IV  Follow up renal function and electrolytes in am.

## 2021-11-24 NOTE — Progress Notes (Signed)
ANTICOAGULATION CONSULT NOTE - Initial Consult  Pharmacy Consult for Heparin infusion Indication: chest pain/ACS  Allergies  Allergen Reactions   Lisinopril Swelling   Jardiance [Empagliflozin] Rash    Patient Measurements: Height: '5\' 4"'$  (162.6 cm) Weight: 60.8 kg (134 lb 0.6 oz) IBW/kg (Calculated) : 54.7 Heparin Dosing Weight: 57.2 kg  Vital Signs: Temp: 97.5 F (36.4 C) (08/29 0315) Temp Source: Oral (08/29 0315) BP: 116/90 (08/29 0315) Pulse Rate: 92 (08/29 0315)  Labs: Recent Labs    11/23/21 1707 11/23/21 1921 11/23/21 2145 11/23/21 2252 11/24/21 0419  HGB 10.7*  --   --   --  10.4*  HCT 35.0*  --   --   --  32.1*  PLT 201  --   --   --  187  APTT  --   --   --   --  58*  HEPARINUNFRC  --   --   --   --  >1.10*  CREATININE 1.38*  --   --   --   --   TROPONINIHS 323* 348* 350* 364*  --      Estimated Creatinine Clearance: 28.5 mL/min (A) (by C-G formula based on SCr of 1.38 mg/dL (H)).   Medical History: Past Medical History:  Diagnosis Date   Cardiomyopathy    a. EF of 25% in 07/2006 b. EF normalized by repeat echo in 2011 c. EF 35-40% by echo in 05/2017 with cath showing mild nonobstructive CAD   Chronic combined systolic and diastolic CHF (congestive heart failure) (HCC)    Chronic combined systolic and diastolic heart failure (HCC)    Colon polyps    Diverticulosis of colon    Hypertension    Hypertensive heart disease    Osteopenia    Paroxysmal atrial fibrillation ([place]) 10/10/2018   Stroke ([place])    TIA (transient ischemic attack)    Tobacco abuse    50 pack year    Medications:  Medications Prior to Admission  Medication Sig Dispense Refill Last Dose   acetaminophen (TYLENOL) 325 MG tablet Take 2 tablets (650 mg total) by mouth every 4 (four) hours as needed for mild pain (or temp > 37.5 C (99.5 F)). (Patient taking differently: Take 650 mg by mouth every 6 (six) hours as needed for mild pain or moderate pain.)   Past Week   ascorbic acid  (VITAMIN C) 500 MG tablet Take 500 mg by mouth daily.   11/23/2021   atorvastatin (LIPITOR) 10 MG tablet Take 1 tablet (10 mg total) by mouth daily. 90 tablet 1 11/22/2021   augmented betamethasone dipropionate (DIPROLENE-AF) 0.05 % ointment Apply 1 Application topically daily as needed (For rash).   11/22/2021   carvedilol (COREG) 6.25 MG tablet Take 1 tablet (6.25 mg total) by mouth 2 (two) times daily with a meal. 180 tablet 3 11/23/2021 at 1000   cetirizine (ZYRTEC) 10 MG tablet Take 10 mg by mouth daily as needed for allergies.   Past Month   citalopram (CELEXA) 10 MG tablet Take 10 mg by mouth daily.   11/23/2021   ELIQUIS 2.5 MG TABS tablet Take 1 tablet (2.5 mg total) by mouth 2 (two) times daily. 60 tablet  11/23/2021 at 1000   furosemide (LASIX) 20 MG tablet Take 1 tablet (20 mg total) by mouth daily. 90 tablet 3 11/23/2021   isosorbide mononitrate (IMDUR) 30 MG 24 hr tablet TAKE 1/2 TABLET(15 MG) BY MOUTH DAILY (Patient taking differently: Take 15 mg by mouth daily.) 45 tablet 2  11/23/2021   COVID-19 mRNA Vac-TriS, Pfizer, (PFIZER-BIONT COVID-19 VAC-TRIS) SUSP injection Inject into the muscle. 0.3 mL 0    pantoprazole (PROTONIX) 40 MG tablet Take 1 tablet (40 mg total) by mouth daily before breakfast. (Patient not taking: Reported on 11/23/2021) 30 tablet 1 Not Taking    Assessment: 81 yo F presented with worsening SOB, despite increased home O2 requirements. Pt takes apixaban 2.'5mg'$  po BID prior to admission for paroxysmal Afib, and h/o stroke. Pharmacy consulted to dose heparin infusion per ACS protocol.   Hgb 10.7, Plt 201 Troponin 323  Last dose of apixaban - 8/28 at ~9:30 AM  8/29 AM update:  aPTT below goal  Goal of Therapy:  Heparin level 0.3-0.7 units/ml aPTT 66-102 seconds Monitor platelets by anticoagulation protocol: Yes   Plan:  Inc heparin to 750 units/hr 1400 heparin level and aPTT  Narda Bonds, PharmD, BCPS Clinical Pharmacist Phone: [phone]

## 2021-11-24 NOTE — Assessment & Plan Note (Addendum)
Patient with chronic non ambulatory state Poor prognosis Continue anticoagulation for atrial fibrillation.   Dyslipidemia, continue with statin therapy.

## 2021-11-24 NOTE — TOC Progression Note (Signed)
Transition of Care [facility]) - Progression Note    Patient Details  Name: Brandy [name] MRN: [id number] Date of Birth: 02/16/1941  Transition [facility]) CM/SW Contact  Zenon Mayo, RN Phone Number: 11/24/2021, 5:10 PM  Clinical Narrative:    NCM spoke with patient at bedside, she is from home with spouse, she is bedbound, husband states she has private duty nursing at home, Mon-Fri from 8:30-2:30 and Wed 5-9 another private person comes in.  Then on Saturday 1-7 and Sunday 9-1.  Patient uses Holiday representative on [employer] in [place], patient has a handicapped Lucianne Lei to transport home in.  She has oxygen at home 2 liters as needed thru [employer], here with Acute CHF, conts on iv lasix.  TOC following.         Expected Discharge Plan and Services                                                 Social Determinants of Health (SDOH) Interventions    Readmission Risk Interventions     No data to display

## 2021-11-24 NOTE — Hospital Course (Signed)
Brandy [name] was admitted to the hospital with the working diagnosis of decompensated heart failure.   81 yo female with the past medical history of CVA with left sided hemiplegia, bed bound, chronic diastolic heart failure and paroxysmal atrial fibrillation who presented with dyspnea. Patient was noted to have progressive dyspnea at home promoting care givers to bring her to the hospital. On his initial physical examination her blood pressure was 115/90, HR 90 to 93, RR 20, and 02 saturation 100% on supplemental 02 per [place]. Lungs with no wheezing or rhonchi, heat with S1 and S2 present and rhythmic, abdomen with no distention, no lower extremity edema. Patient with left sided hemiplegia, minimally verbal.   Na 141, K 4,2 Cl 107 bicarbonate 26 glucose 86 bun 31 cr 1.38 Mg 1,7  High sensitive troponin 323, 348, 350, 364  BNP 1,910 Wbc 4,5 hgb 10,7 plt 201   Chest radiograph with scoliosis and film rotated to the right, has bilateral interstitial infiltrates predominantly at bases, with cephalization of the vasculature and bilateral hilar vascular congestion. Bilateral pleural effusions.   EKG 99 bpm, left axis deviation, normal intervals, sinus rhythm with poor R-R wave progression, q wave V1 to V4 with no significant ST segment or T wave changes, low voltage.

## 2021-11-24 NOTE — Progress Notes (Addendum)
Progress Note   Patient: Brandy [name] TDV:[id number] DOB: Apr 15, 1940 DOA: 11/23/2021     1 DOS: the patient was seen and examined on 11/24/2021   Brief hospital course: Mrs. Freshour was admitted to the hospital with the working diagnosis of decompensated heart failure.   81 yo female with the past medical history of CVA with left sided hemiplegia, bed bound, chronic diastolic heart failure and paroxysmal atrial fibrillation who presented with dyspnea. Patient was noted to have progressive dyspnea at home promoting care givers to bring her to the hospital. On his initial physical examination her blood pressure was 115/90, HR 90 to 93, RR 20, and 02 saturation 100% on supplemental 02 per [place]. Lungs with no wheezing or rhonchi, heat with S1 and S2 present and rhythmic, abdomen with no distention, no lower extremity edema. Patient with left sided hemiplegia, minimally verbal.   Na 141, K 4,2 Cl 107 bicarbonate 26 glucose 86 bun 31 cr 1.38 Mg 1,7  High sensitive troponin 323, 348, 350, 364  BNP 1,910 Wbc 4,5 hgb 10,7 plt 201   Chest radiograph with scoliosis and film rotated to the right, has bilateral interstitial infiltrates predominantly at bases, with cephalization of the vasculature and bilateral hilar vascular congestion. Bilateral pleural effusions.   EKG 99 bpm, left axis deviation, normal intervals, sinus rhythm with poor R-R wave progression, q wave V1 to V4 with no significant ST segment or T wave changes, low voltage.   Assessment and Plan: * Acute CHF (congestive heart failure) ([place]) 08/2021 echocardiogram with reduced LV systolic function 20 to 06%, with sever LVH, suggesting cardiac amyloidosis. RV systolic function with moderate reduction. Severe dilatation of left atrium. Small pericardial effusion.   Not documented urine output.  Systolic blood pressure 98 to 118 mmHg. Clinically her volume has improved.   Will hold on furosemide for now and re evaluate in am.  Continue with  metoprolol and isosorbide.   Elevation in high sensitive troponin not consistent with acute coronary syndrome. Discontinue heparin.     CKD (chronic kidney disease) stage 3, GFR 30-59 ml/min (HCC) Renal function with serum cr at 1,45 with K at 3,5 and serum bicarbonate at 27, Mg is 1,6  Plan to add 2 g Mag sulfate IV  Follow up renal function and electrolytes in am.   HTN (hypertension) Continue blood pressure control with metoprolol and isosorbide.   PAF (paroxysmal atrial fibrillation) ([place]) As outpatient patient was changed from carvedilol to metoprolol due to bradycardia.  Plan to continue telemetry monitoring and anticoagulation,   Hemiparesis affecting left side as late effect of stroke [facility]) Patient with chronic non ambulatory state Poor prognosis Continue anticoagulation for atrial fibrillation.   Dyslipidemia, continue with statin therapy.         Subjective: Patient is feeling better, no chest pain or dyspnea,   Physical Exam: Vitals:   11/24/21 0315 11/24/21 0520 11/24/21 0732 11/24/21 1619  BP: (!) 116/90 127/87 118/80 96/71  Pulse: 92 70 99   Resp: '18 18 20 18  '$ Temp: (!) 97.5 F (36.4 C) (!) 97.5 F (36.4 C) 98.2 F (36.8 C)   TempSrc: Oral Oral Oral   SpO2: 100%  99% 96%  Weight: 60.8 kg     Height:       Neurology awake and alert, hemiparesis and non ambulatory state chronic ENT with no pallor Cardiovascular with S1 and S2 present, regular with no gallops or rubs No JVD No lower extremity edema Respiratory with no rales or  wheezing Abdomen with no distention  Data Reviewed:    Family Communication: I spoke with patient's husband at the bedside, we talked in detail about patient's condition, plan of care and prognosis and all questions were addressed.   Disposition: Status is: Inpatient Remains inpatient appropriate because: heart failure   Planned Discharge Destination: Home  Author: Tawni Millers, MD 11/24/2021 4:39  PM  For on call review [email or web address].

## 2021-11-24 NOTE — Progress Notes (Signed)
Heart Failure Navigator Progress Note  Assessed for Heart & Vascular TOC clinic readiness.  Patient does not meet criteria due to patient primary bedbound. Pt AO x2. Pt has cardiology follow up in the next two weeks with Dr. Joya Gaskins.[place], including a PYP scan 9/8. No need for HV TOC clinic.   Navigator available for reassessment of patient.   Pricilla Holm, MSN, RN

## 2021-11-25 DIAGNOSIS — I480 Paroxysmal atrial fibrillation: Secondary | ICD-10-CM | POA: Diagnosis not present

## 2021-11-25 DIAGNOSIS — I5041 Acute combined systolic (congestive) and diastolic (congestive) heart failure: Secondary | ICD-10-CM | POA: Diagnosis not present

## 2021-11-25 DIAGNOSIS — N1832 Chronic kidney disease, stage 3b: Secondary | ICD-10-CM | POA: Diagnosis not present

## 2021-11-25 DIAGNOSIS — I509 Heart failure, unspecified: Secondary | ICD-10-CM | POA: Diagnosis not present

## 2021-11-25 LAB — BASIC METABOLIC PANEL
Anion gap: 6 (ref 5–15)
BUN: 32 mg/dL — ABNORMAL HIGH (ref 8–23)
CO2: 30 mmol/L (ref 22–32)
Calcium: 8.9 mg/dL (ref 8.9–10.3)
Chloride: 108 mmol/L (ref 98–111)
Creatinine, Ser: 1.5 mg/dL — ABNORMAL HIGH (ref 0.44–1.00)
GFR, Estimated: 35 mL/min — ABNORMAL LOW (ref >60)
Glucose, Bld: 80 mg/dL (ref 70–99)
Potassium: 2.9 mmol/L — ABNORMAL LOW (ref 3.5–5.1)
Sodium: 144 mmol/L (ref 135–145)

## 2021-11-25 LAB — CBC
HCT: 30.7 % — ABNORMAL LOW (ref 36.0–46.0)
Hemoglobin: 9.9 g/dL — ABNORMAL LOW (ref 12.0–15.0)
MCH: 29.3 pg (ref 26.0–34.0)
MCHC: 32.2 g/dL (ref 30.0–36.0)
MCV: 90.8 fL (ref 80.0–100.0)
Platelets: 204 10*3/uL (ref 150–400)
RBC: 3.38 MIL/uL — ABNORMAL LOW (ref 3.87–5.11)
RDW: 14.8 % (ref 11.5–15.5)
WBC: 3.7 10*3/uL — ABNORMAL LOW (ref 4.0–10.5)
nRBC: 0 % (ref 0.0–0.2)

## 2021-11-25 MED ORDER — POTASSIUM CHLORIDE CRYS ER 20 MEQ PO TBCR
40.0000 meq | EXTENDED_RELEASE_TABLET | Freq: Two times a day (BID) | ORAL | Status: AC
Start: 2021-11-25 — End: 2021-11-25
  Administered 2021-11-25 (×2): 40 meq via ORAL
  Filled 2021-11-25 (×2): qty 2

## 2021-11-25 MED ORDER — SPIRONOLACTONE 12.5 MG HALF TABLET
12.5000 mg | ORAL_TABLET | Freq: Every day | ORAL | Status: DC
  Administered 2021-11-25 – 2021-11-28 (×4): 12.5 mg via ORAL
  Filled 2021-11-25 (×4): qty 1

## 2021-11-25 MED ORDER — METOPROLOL SUCCINATE ER 50 MG PO TB24
50.0000 mg | ORAL_TABLET | Freq: Every day | ORAL | Status: DC
  Administered 2021-11-26 – 2021-11-28 (×3): 50 mg via ORAL
  Filled 2021-11-25 (×3): qty 1

## 2021-11-25 MED ORDER — FUROSEMIDE 10 MG/ML IJ SOLN
40.0000 mg | Freq: Two times a day (BID) | INTRAMUSCULAR | Status: DC
  Administered 2021-11-25: 40 mg via INTRAVENOUS
  Filled 2021-11-25: qty 4

## 2021-11-25 NOTE — Progress Notes (Signed)
PROGRESS NOTE    Brandy [name]  VOZ:[id number] DOB: 1940/06/03 DOA: 11/23/2021 PCP: [name], MD  81/F with history of chronic systolic and diastolic CHF, paroxysmal A-fib, COPD on home O2, CVA with left hemiplegia, bedbound, saw [name] recently had an outpatient echo on 6/28 which noted EF of 20-25% with severe LVH, grade 3 DD and concern for cardiac amyloidosis. -Presented to the ED on 8/28 with dyspnea on exertion, edema, BNP was 1910, troponin in the 300s, chest x-ray noted pulmonary edema and bilateral pleural effusions   Subjective: Feels better today, breathing is improving, eating breakfast  Assessment and Plan:  Acute on chronic systolic and diastolic CHF Biventricular heart failure -Recent outpatient echo noted EF 20-25%, severe LVH, grade 3 DD, moderately reduced RV function and myocardial pattern concerning for cardiac amyloidosis -Cath in 2019 noted mild nonobstructive CAD -Diuresed with IV Lasix on admission, I/os are incomplete, weight trending up, likely inaccurate -JVD elevated, will repeat 1 dose of IV Lasix today -Start low-dose Aldactone -Elevated troponin is likely secondary to demand ischemia -Will request cardiology input, overall prognosis appears poor, may need palliative care evaluation as well  CKD 3 -Mild uptrend in creatinine, note not far from baseline, monitor  HTN (hypertension) -Continue metoprolol, decrease Imdur dose  PAF (paroxysmal atrial fibrillation) (HCC) -Continue metoprolol, Eliquis  History of CVA Left hemiplegia, bed and wheelchair bound -Continue Eliquis and statin -Overall prognosis is poor  DVT prophylaxis: Eliquis Code Status: Full code, discussed CODE STATUS with patient today, she would like to think about this further Family Communication: Spouse at bedside Disposition Plan: Home pending cardiac work-up  Consultants: Cardiology   Procedures:   Antimicrobials:    Objective: Vitals:   11/24/21 0732  11/24/21 1619 11/24/21 1940 11/25/21 0855  BP: 1'18/80 96/71 93/62 '$ 118/80  Pulse: 99   90  Resp: '20 18 20 '$ (!) 24  Temp: 98.2 F (36.8 C)  99 F (37.2 C) 98.6 F (37 C)  TempSrc: Oral  Oral Oral  SpO2: 99% 96% 100% 100%  Weight:      Height:        Intake/Output Summary (Last 24 hours) at 11/25/2021 1143 Last data filed at 11/24/2021 1637 Gross per 24 hour  Intake 331.24 ml  Output 1000 ml  Net -668.76 ml   Filed Weights   11/23/21 1417 11/24/21 0315  Weight: 57.2 kg 60.8 kg    Examination:  General exam: Chronically ill female sitting up in bed, AAOx3, no distress HEENT: Positive JVD CVS: S1-S2, regular rhythm Lungs: Decreased breath sounds bases Abdomen: Soft, nontender, bowel sounds present Extremities: No edema Neuro: Left hemiplegia Psychiatry:  Mood & affect appropriate.     Data Reviewed:   CBC: Recent Labs  Lab 11/23/21 1707 11/24/21 0419 11/25/21 0631  WBC 4.5 4.2 3.7*  NEUTROABS 2.9  --   --   HGB 10.7* 10.4* 9.9*  HCT 35.0* 32.1* 30.7*  MCV 95.4 90.4 90.8  PLT 201 187 425   Basic Metabolic Panel: Recent Labs  Lab 11/23/21 1707 11/24/21 0419 11/25/21 0631  NA 141 143 144  K 4.2 3.5 2.9*  CL 107 107 108  CO2 '26 27 30  '$ GLUCOSE 86 82 80  BUN 31* 31* 32*  CREATININE 1.38* 1.45* 1.50*  CALCIUM 9.2 9.1 8.9  MG 1.7 1.6*  --    GFR: Estimated Creatinine Clearance: 26.3 mL/min (A) (by C-G formula based on SCr of 1.5 mg/dL (H)). Liver Function Tests: Recent Labs  Lab 11/23/21 1707  AST 20  ALT 7  ALKPHOS 55  BILITOT 0.9  PROT 5.9*  ALBUMIN 2.7*   No results for input(s): "LIPASE", "AMYLASE" in the last 168 hours. No results for input(s): "AMMONIA" in the last 168 hours. Coagulation Profile: No results for input(s): "INR", "PROTIME" in the last 168 hours. Cardiac Enzymes: No results for input(s): "CKTOTAL", "CKMB", "CKMBINDEX", "TROPONINI" in the last 168 hours. BNP (last 3 results) No results for input(s): "PROBNP" in the last 8760  hours. HbA1C: No results for input(s): "HGBA1C" in the last 72 hours. CBG: No results for input(s): "GLUCAP" in the last 168 hours. Lipid Profile: No results for input(s): "CHOL", "HDL", "LDLCALC", "TRIG", "CHOLHDL", "LDLDIRECT" in the last 72 hours. Thyroid Function Tests: Recent Labs    11/24/21 0419  TSH 0.890   Anemia Panel: No results for input(s): "VITAMINB12", "FOLATE", "FERRITIN", "TIBC", "IRON", "RETICCTPCT" in the last 72 hours. Urine analysis:    Component Value Date/Time   COLORURINE YELLOW 10/10/2018 1951   APPEARANCEUR CLEAR 10/10/2018 1951   LABSPEC 1.014 10/10/2018 1951   PHURINE 5.0 10/10/2018 1951   GLUCOSEU NEGATIVE 10/10/2018 1951   HGBUR NEGATIVE 10/10/2018 [place] NEGATIVE 10/10/2018 [place] NEGATIVE 10/10/2018 1951   PROTEINUR NEGATIVE 10/10/2018 1951   NITRITE NEGATIVE 10/10/2018 1951   LEUKOCYTESUR NEGATIVE 10/10/2018 1951   Sepsis Labs: '@LABRCNTIP'$ (procalcitonin:4,lacticidven:4)  ) Recent Results (from the past 240 hour(s))  Resp Panel by RT-PCR (Flu A&B, Covid) Anterior Nasal Swab     Status: None   Collection Time: 11/23/21  3:26 PM   Specimen: Anterior Nasal Swab  Result Value Ref Range Status   SARS Coronavirus 2 by RT PCR NEGATIVE NEGATIVE Final    Comment: (NOTE) SARS-CoV-2 target nucleic acids are NOT DETECTED.  The SARS-CoV-2 RNA is generally detectable in upper respiratory specimens during the acute phase of infection. The lowest concentration of SARS-CoV-2 viral copies this assay can detect is 138 copies/mL. A negative result does not preclude SARS-Cov-2 infection and should not be used as the sole basis for treatment or other patient management decisions. A negative result may occur with  improper specimen collection/handling, submission of specimen other than nasopharyngeal swab, presence of viral mutation(s) within the areas targeted by this assay, and inadequate number of viral copies(<138 copies/mL). A  negative result must be combined with clinical observations, patient history, and epidemiological information. The expected result is Negative.  Fact Sheet for Patients:  EntrepreneurPulse.com.au  Fact Sheet for Healthcare Providers:  IncredibleEmployment.be  This test is no t yet approved or cleared by the [place] FDA and  has been authorized for detection and/or diagnosis of SARS-CoV-2 by FDA under an Emergency Use Authorization (EUA). This EUA will remain  in effect (meaning this test can be used) for the duration of the COVID-19 declaration under Section 564(b)(1) of the Act, 21 U.S.C.section 360bbb-3(b)(1), unless the authorization is terminated  or revoked sooner.       Influenza A by PCR NEGATIVE NEGATIVE Final   Influenza B by PCR NEGATIVE NEGATIVE Final    Comment: (NOTE) The Xpert Xpress SARS-CoV-2/FLU/RSV plus assay is intended as an aid in the diagnosis of influenza from Nasopharyngeal swab specimens and should not be used as a sole basis for treatment. Nasal washings and aspirates are unacceptable for Xpert Xpress SARS-CoV-2/FLU/RSV testing.  Fact Sheet for Patients: EntrepreneurPulse.com.au  Fact Sheet for Healthcare Providers: IncredibleEmployment.be  This test is not yet approved or cleared by the [place] and has been authorized for  detection and/or diagnosis of SARS-CoV-2 by FDA under an Emergency Use Authorization (EUA). This EUA will remain in effect (meaning this test can be used) for the duration of the COVID-19 declaration under Section 564(b)(1) of the Act, 21 U.S.C. section 360bbb-3(b)(1), unless the authorization is terminated or revoked.  Performed at [facility], [place] [address], [place], [place] [address]      Radiology Studies: DG Chest Portable 1 View  Result Date: 11/23/2021 CLINICAL DATA:  Dyspnea. Worsening shortness of breath over the last  several days. EXAM: PORTABLE CHEST 1 VIEW COMPARISON:  Radiographs 01/29/2020 and 01/21/2020.  CT 09/14/2017. FINDINGS: 1532 hours. The lower left chest is partially excluded from this portable examination. The heart is enlarged but stable. There is aortic atherosclerosis. Moderate bilateral pleural effusions with pulmonary edema, similar to prior radiographs. No evidence of pneumothorax. Previous cervical fusion without evidence of acute osseous abnormality. IMPRESSION: Cardiomegaly, pulmonary edema and bilateral pleural effusions consistent with recurrent congestive heart failure. Electronically Signed   By: Richardean Sale M.D.   On: 11/23/2021 15:42     Scheduled Meds:  apixaban  2.5 mg Oral BID   atorvastatin  10 mg Oral QHS   citalopram  10 mg Oral Daily   diclofenac Sodium  2 g Topical QID   [START ON 11/26/2021] metoprolol succinate  50 mg Oral Daily   potassium chloride  40 mEq Oral BID   spironolactone  12.5 mg Oral Daily   Continuous Infusions:   LOS: 2 days    Time spent: 62mn  PDomenic Polite MD [facility]   11/25/2021, 11:43 AM

## 2021-11-25 NOTE — Consult Note (Signed)
Cardiology Consultation   Patient ID: Brandy [name] MRN: [id number]; DOB: April 05, 1940  Admit date: 11/23/2021 Date of Consult: 11/25/2021  PCP:  [name], MD   [facility] Cardiologist:  Skeet Latch, MD     Patient Profile:   Brandy [name] is a 81 y.o. female with a hx of left-sided hemiplegia who is bedbound, chronic systolic and diastolic CHF, paroxysmal atrial fibrillation, COPD on home O2 who is being seen 11/25/2021 for the evaluation of CHF at the request of [name].  History of Present Illness:   Brandy [name] is a 81 year old female with above medical history who is followed by [name]. Per chart review, patient has a long history of cardiomyopathy, first diagnosed in 07/2006 when echo showed EF 20-25%. However, EF improved to 55-60% in 09/2009.   In 04/2017, patient developed worsening SOB and had an echocardiogram on 05/17/2017 for evaluation. Echo showed EF had reduced to 40% with severe hypokinesis of the inferoseptal myocardium, mild-moderate MR, moderate-severe dilation of the LA. Cardiac catheterization on 06/03/2017 showed mild nonobstructive disease with a mid RCA 25% stenosis and a 25% stenosis in the ostial second diagonal.   In 01/2018, patient had a right MCA infarction. TEE on 02/13/2018 showed LVEF 30-35%, moderate diffuse hypokinesis, mild mitral regurgitation, possible early forming thrombus in the left atrial appendage vs artifact. During the study, she had frequent atrial ectopy and it was recommended she wear a 30 day event monitor as she was at high risk for afib.   Patient was again admitted to the hospital in 03/2018 for evaluation of CHF. Echo on 03/30/2018 showed EF 70-17%, grade 2 diastolic dysfunction, mild-moderate MR. She was noted to have paroxysmal atrial fibrillation that admission, treated with eliquis and carvedilol. She was later seen in 08/2018 for consideration of ICD, but echo on 11/16/2018 showed improvement in EF to  35-40%.   In 09/2018, she was admitted to the hospital with hypernatremia, Na 151. She was nonambulatory and nonverbal at that time, palliative care consulted and patient developed acute renal failure with creatinine 2.5. A PEG tube was placed and for a few years she rarely ate by mouth until her feeding tube fell out in 07/2020. She was eating well at that time so it was not replaced.   Patient was last seen by cardiology in the clinic on 09/10/2021. At that time, patient was complaining of abdominal pain but was otherwise doing well. She denied chest pain, sob, orthopnea, edema. Echocardiogram on 09/23/2021 showed EF 20-25%, severe LVH with grade III diastolic dysfunction. Global longitudinal strain pattern was abnormal at -3.7%, pattern was suggestive of cardiac amyloidosis. RV systolic function was moderately reduced, small pericardial effusion present. Planned PYP scan for further evaluation, but the study has not yet been completed.   Patient presented to the ED on 11/23/21 complaining of SOB that had been going on for the past few days. Labs in the ED showed Na 141, K 4.2, creatinine 1.38, WBC 4.5, hemoglobin 10.7, platelets 201. BNP elevated to 1910. hsTn 323>>348>>350>>364.   CXR showed cardiomegaly, pulmonary edema and bilateral pleural effusions consistent with recurrent CHF. EKG showed sinus tachycardia with HR 104 BPM, PVC present.   On interview, patient reports that she was having sob for a few days. She is bedbound, had SOB at rest. Caregiver noted swelling ankles and abdominal distention. Denied orthopnea, chest pain, palpitations. Now, after diuresis, patient feels that her breathing has returned to baseline. She is without chest  pain, sob, palpitations, ankle edema.   Past Medical History:  Diagnosis Date   Cardiomyopathy    a. EF of 25% in 07/2006 b. EF normalized by repeat echo in 2011 c. EF 35-40% by echo in 05/2017 with cath showing mild nonobstructive CAD   Chronic combined  systolic and diastolic CHF (congestive heart failure) (HCC)    Chronic combined systolic and diastolic heart failure (HCC)    Colon polyps    Diverticulosis of colon    Hypertension    Hypertensive heart disease    Osteopenia    Paroxysmal atrial fibrillation ([place]) 10/10/2018   Stroke ([place])    TIA (transient ischemic attack)    Tobacco abuse    50 pack year    Past Surgical History:  Procedure Laterality Date   BREAST BIOPSY     CARPAL TUNNEL RELEASE     right hand   CERVICAL FUSION     CESAREAN SECTION     2 times   COLONOSCOPY  5852   pt uncertain as to whether polypectomy required or performed   COLONOSCOPY WITH PROPOFOL N/A 01/23/2020   Procedure: COLONOSCOPY WITH PROPOFOL;  Surgeon: Rogene Houston, MD;  [facility];  Service: Endoscopy;  Laterality: N/A;   ECTOPIC PREGNANCY SURGERY     ESOPHAGOGASTRODUODENOSCOPY (EGD) WITH PROPOFOL N/A 10/13/2018   Procedure: ESOPHAGOGASTRODUODENOSCOPY (EGD) WITH PROPOFOL;  Surgeon: Aviva Signs, MD;  Location: [facility];  Service: General;  Laterality: N/A;   ESOPHAGOGASTRODUODENOSCOPY (EGD) WITH PROPOFOL N/A 01/23/2020   Procedure: ESOPHAGOGASTRODUODENOSCOPY (EGD) WITH PROPOFOL;  Surgeon: Rogene Houston, MD;  [facility];  Service: Endoscopy;  Laterality: N/A;   IR ANGIO VERTEBRAL SEL SUBCLAVIAN INNOMINATE UNI R MOD SED  02/10/2018   IR CT HEAD LTD  02/10/2018   IR PERCUTANEOUS ART THROMBECTOMY/INFUSION INTRACRANIAL INC DIAG ANGIO  02/10/2018   PEG PLACEMENT N/A 10/13/2018   Procedure: PERCUTANEOUS ENDOSCOPIC GASTROSTOMY (PEG) PLACEMENT;  Surgeon: Aviva Signs, MD;  Location: [facility];  Service: General;  Laterality: N/A;   PEG PLACEMENT N/A 12/04/2019   Procedure: PERCUTANEOUS ENDOSCOPIC GASTROSTOMY (PEG) REPLACEMENT;  Surgeon: Aviva Signs, MD;  [facility];  Service: Gastroenterology;  Laterality: N/A;   PEG PLACEMENT N/A 12/12/2019   Procedure: PERCUTANEOUS ENDOSCOPIC GASTROSTOMY (PEG) REPLACEMENT;   Surgeon: Aviva Signs, MD;  [facility];  Service: Gastroenterology;  Laterality: N/A;   POLYPECTOMY  01/23/2020   Procedure: POLYPECTOMY;  Surgeon: Rogene Houston, MD;  [facility];  Service: Endoscopy;;   RADIOLOGY WITH ANESTHESIA N/A 02/10/2018   Procedure: RADIOLOGY WITH ANESTHESIA;  Surgeon: Luanne Bras, MD;  Location: [facility];  Service: Radiology;  Laterality: N/A;   RIGHT/LEFT HEART CATH AND CORONARY ANGIOGRAPHY N/A 06/03/2017   Procedure: RIGHT/LEFT HEART CATH AND CORONARY ANGIOGRAPHY;  Surgeon: Jettie Booze, MD;  Location: [facility];  Service: Cardiovascular;  Laterality: N/A;   TEE WITHOUT CARDIOVERSION N/A 02/13/2018   Procedure: TRANSESOPHAGEAL ECHOCARDIOGRAM (TEE);  Surgeon: Sueanne Margarita, MD;  Location: [facility];  Service: Cardiovascular;  Laterality: N/A;   TONSILLECTOMY       Home Medications:  Prior to Admission medications   Medication Sig Start Date End Date Taking? Authorizing Provider  acetaminophen (TYLENOL) 325 MG tablet Take 2 tablets (650 mg total) by mouth every 4 (four) hours as needed for mild pain (or temp > 37.5 C (99.5 F)). Patient taking differently: Take 650 mg by mouth every 6 (six) hours as needed for mild pain or moderate  pain. 03/16/18  Yes Angiulli, Lavon Paganini, PA-C  ascorbic acid (VITAMIN C) 500 MG tablet Take 500 mg by mouth daily.   Yes [provider]  atorvastatin (LIPITOR) 10 MG tablet Take 1 tablet (10 mg total) by mouth daily. 05/28/21  Yes Skeet Latch, MD  augmented betamethasone dipropionate (DIPROLENE-AF) 0.05 % ointment Apply 1 Application topically daily as needed (For rash). 11/16/21  Yes [provider]  carvedilol (COREG) 6.25 MG tablet Take 1 tablet (6.25 mg total) by mouth 2 (two) times daily with a meal. 09/22/21  Yes Skeet Latch, MD  cetirizine (ZYRTEC) 10 MG tablet Take 10 mg by mouth daily as needed for allergies.   Yes [provider]  citalopram  (CELEXA) 10 MG tablet Take 10 mg by mouth daily.   Yes [provider]  ELIQUIS 2.5 MG TABS tablet Take 1 tablet (2.5 mg total) by mouth 2 (two) times daily. 01/26/20  Yes Tyleek Smick, Clanford L, MD  furosemide (LASIX) 20 MG tablet Take 1 tablet (20 mg total) by mouth daily. 09/22/21  Yes Skeet Latch, MD  isosorbide mononitrate (IMDUR) 30 MG 24 hr tablet TAKE 1/2 TABLET(15 MG) BY MOUTH DAILY Patient taking differently: Take 15 mg by mouth daily. 05/01/21  Yes Skeet Latch, MD  COVID-19 mRNA Vac-TriS, Pfizer, (PFIZER-BIONT COVID-19 VAC-TRIS) SUSP injection Inject into the muscle. 10/29/20   Carlyle Basques, MD  pantoprazole (PROTONIX) 40 MG tablet Take 1 tablet (40 mg total) by mouth daily before breakfast. Patient not taking: Reported on 11/23/2021 01/25/20   Murlean Iba, MD    Inpatient Medications: Scheduled Meds:  apixaban  2.5 mg Oral BID   atorvastatin  10 mg Oral QHS   citalopram  10 mg Oral Daily   diclofenac Sodium  2 g Topical QID   [START ON 11/26/2021] metoprolol succinate  50 mg Oral Daily   potassium chloride  40 mEq Oral BID   spironolactone  12.5 mg Oral Daily   Continuous Infusions:  PRN Meds: acetaminophen **OR** acetaminophen  Allergies:    Allergies  Allergen Reactions   Lisinopril Swelling   Jardiance [Empagliflozin] Rash    Social History:   Social History   Socioeconomic History   Marital status: Married    Spouse name: Not on file   Number of children: Not on file   Years of education: Not on file   Highest education level: Not on file  Occupational History   Occupation: Former Product manager: RETIRED  Tobacco Use   Smoking status: Former    Packs/day: 1.00    Years: 50.00    Total pack years: 50.00    Types: Cigarettes    Start date: 09/02/1960    Quit date: 03/29/2008    Years since quitting: 13.6   Smokeless tobacco: Never  Vaping Use   Vaping Use: Never used  Substance and Sexual Activity   Alcohol use: No     Alcohol/week: 0.0 standard drinks of alcohol   Drug use: Never   Sexual activity: Not Currently  Other Topics Concern   Not on file  Social History Narrative   Married with 2 adult children, resides with spouse    Social Determinants of Radio broadcast assistant Strain: Not on file  Food Insecurity: Not on file  Transportation Needs: Not on file  Physical Activity: Not on file  Stress: Not on file  Social Connections: Not on file  Intimate Partner Violence: Not on file    Family History:  Family History  Problem Relation Age of Onset   Hypertension Mother    Hypertension Father    Hypertension Brother    Hypertension Maternal Grandmother    Stroke Maternal Grandmother    Cancer Paternal Grandmother    Colon cancer Neg Hx      ROS:  Please see the history of present illness.   All other ROS reviewed and negative.     Physical Exam/Data:   Vitals:   11/24/21 0732 11/24/21 1619 11/24/21 1940 11/25/21 0855  BP: 1'18/80 96/71 93/62 '$ 118/80  Pulse: 99   90  Resp: '20 18 20 '$ (!) 24  Temp: 98.2 F (36.8 C)  99 F (37.2 C) 98.6 F (37 C)  TempSrc: Oral  Oral Oral  SpO2: 99% 96% 100% 100%  Weight:      Height:        Intake/Output Summary (Last 24 hours) at 11/25/2021 1054 Last data filed at 11/24/2021 1637 Gross per 24 hour  Intake 331.24 ml  Output 1000 ml  Net -668.76 ml      11/24/2021    3:15 AM 11/23/2021    2:17 PM 03/05/2021   10:50 AM  Last 3 Weights  Weight (lbs) 134 lb 0.6 oz 126 lb 126 lb  Weight (kg) 60.8 kg 57.153 kg 57.153 kg     Body mass index is 23.01 kg/m.  General:  Frail, chronically ill appearing female in no acute distress  HEENT: normal Neck: no JVD with head of bed at 30 degrees  Vascular: Radial pulses 2+ bilaterally Cardiac:  normal S1, S2; RRR; no murmur  Lungs:  Diminished breath sounds throughout, otherwise clear to auscultation. Normal wob on room air   Abd: soft, mildly tender to palpation  Ext: no edema Musculoskeletal:   No deformities, BUE and BLE strength normal and equal Skin: warm and dry  Neuro:  CNs 2-12 intact, no focal abnormalities noted Psych:  Normal affect   EKG:  The EKG was personally reviewed and demonstrates:  sinus tachycardia with HR 104 BPM, PVC present. Telemetry:  Telemetry was personally reviewed and demonstrates:  Sinus rhythm, HR in the 100s, infrequent PVCs and brief 3 beat runs of NSVT   Relevant CV Studies:   Laboratory Data:  High Sensitivity Troponin:   Recent Labs  Lab 11/23/21 1707 11/23/21 1921 11/23/21 2145 11/23/21 2252  TROPONINIHS 323* 348* 350* 364*     Chemistry Recent Labs  Lab 11/23/21 1707 11/24/21 0419 11/25/21 0631  NA 141 143 144  K 4.2 3.5 2.9*  CL 107 107 108  CO2 '26 27 30  '$ GLUCOSE 86 82 80  BUN 31* 31* 32*  CREATININE 1.38* 1.45* 1.50*  CALCIUM 9.2 9.1 8.9  MG 1.7 1.6*  --   GFRNONAA 39* 37* 35*  ANIONGAP '8 9 6    '$ Recent Labs  Lab 11/23/21 1707  PROT 5.9*  ALBUMIN 2.7*  AST 20  ALT 7  ALKPHOS 55  BILITOT 0.9   Lipids No results for input(s): "CHOL", "TRIG", "HDL", "LABVLDL", "LDLCALC", "CHOLHDL" in the last 168 hours.  Hematology Recent Labs  Lab 11/23/21 1707 11/24/21 0419 11/25/21 0631  WBC 4.5 4.2 3.7*  RBC 3.67* 3.55* 3.38*  HGB 10.7* 10.4* 9.9*  HCT 35.0* 32.1* 30.7*  MCV 95.4 90.4 90.8  MCH 29.2 29.3 29.3  MCHC 30.6 32.4 32.2  RDW 14.9 14.8 14.8  PLT 201 187 204   Thyroid  Recent Labs  Lab 11/24/21 0419  TSH 0.890    BNP  Recent Labs  Lab 11/23/21 1707  BNP 1,910.5*    DDimer No results for input(s): "DDIMER" in the last 168 hours.   Radiology/Studies:  DG Chest Portable 1 View  Result Date: 11/23/2021 CLINICAL DATA:  Dyspnea. Worsening shortness of breath over the last several days. EXAM: PORTABLE CHEST 1 VIEW COMPARISON:  Radiographs 01/29/2020 and 01/21/2020.  CT 09/14/2017. FINDINGS: 1532 hours. The lower left chest is partially excluded from this portable examination. The heart is enlarged but  stable. There is aortic atherosclerosis. Moderate bilateral pleural effusions with pulmonary edema, similar to prior radiographs. No evidence of pneumothorax. Previous cervical fusion without evidence of acute osseous abnormality. IMPRESSION: Cardiomegaly, pulmonary edema and bilateral pleural effusions consistent with recurrent congestive heart failure. Electronically Signed   By: Richardean Sale M.D.   On: 11/23/2021 15:42     Assessment and Plan:   Acute on Chronic combined systolic and diastolic heart failure  Biventricular failure  Nonischemic cardiomyopathy  - Most recent echocardiogram on 09/23/2021 showed EF 20-25%, severe LVH with grade III diastolic dysfunction, moderately reduced RV systolic function. Regional strain pattern was suggestive of cardiac amyloidosis  - Note that cath in 2019 showed mild, nonobstructive CAD  - Patient presented complaining of SOB that had been ongoing for a few days. BNP elevated to 1910. CXR with cardiomegaly, pulmonary edema, bilateral pleural effusions  - Patient has been diuresing with IV lasix 40 mg daily. Has received 3 doses so far, I/Os incomplete. Weights unreliable  - Patient is euvolemic on exam, received a dose of IV lasix this AM. Stop lasix  - Not on ACEi/ARB/ARNI due to renal function  - HR elevated to 100s, increase metoprolol to 50 mg daily  - Stop imdur to make BP room for increased metoprolol  - Continue spironolactone 12.5 mg daily  - Patient is scheduled to undergo PYP scan on 12/04/2021 for further evaluation of possible amyloidosis. Discussed with caregiver that patient still needs to present for this scan as scheduled    Paroxysmal Atrial Fibrillation  - Patient maintaining sinus rhythm with HR in the 100s per telemetry. Infrequent PVCs  - Increase metoprolol to 50 mg daily as above   - Continue eliquis 2.5 mg BID (dose reduced as patient has borderline age, weight, and renal function)  CKD stage III  - Baseline creatinine appears  to be around 1.3-1.6 - Avoid nephrotoxic medications   HTN  - Increase metoprolol to 50 mg daily  - Stop imdur  - Continue spironolactone 12.5 mg daily   Otherwise per primary  - History of CVA with residual left sided hemiparesis  - Chronic anemia, likely from renal disease    Risk Assessment/Risk Scores:      New York Heart Association (NYHA) Functional Class NYHA Class IV  CHA2DS2-VASc Score = 7   This indicates a 11.2% annual risk of stroke. The patient's score is based upon: CHF History: 1 HTN History: 1 Diabetes History: 0 Stroke History: 2 Vascular Disease History: 0 Age Score: 2 Gender Score: 1       For questions or updates, please contact [place] Please consult [email or web address] for contact info under    Signed, Margie Billet, PA-C  11/25/2021 10:54 AM

## 2021-11-25 NOTE — Care Management Important Message (Signed)
Important Message  Patient Details  Name: Brandy [name] MRN: [id number] Date of Birth: 18-Jun-1940   Medicare Important Message Given:  Yes     Shelda Altes 11/25/2021, 8:01 AM

## 2021-11-26 ENCOUNTER — Other Ambulatory Visit (HOSPITAL_BASED_OUTPATIENT_CLINIC_OR_DEPARTMENT_OTHER): Payer: Self-pay | Admitting: Cardiovascular Disease

## 2021-11-26 ENCOUNTER — Inpatient Hospital Stay (HOSPITAL_COMMUNITY): Payer: Medicare PPO

## 2021-11-26 DIAGNOSIS — N1832 Chronic kidney disease, stage 3b: Secondary | ICD-10-CM | POA: Diagnosis not present

## 2021-11-26 DIAGNOSIS — I639 Cerebral infarction, unspecified: Secondary | ICD-10-CM

## 2021-11-26 DIAGNOSIS — I509 Heart failure, unspecified: Secondary | ICD-10-CM | POA: Diagnosis not present

## 2021-11-26 LAB — BASIC METABOLIC PANEL
Anion gap: 11 (ref 5–15)
BUN: 30 mg/dL — ABNORMAL HIGH (ref 8–23)
CO2: 28 mmol/L (ref 22–32)
Calcium: 9.4 mg/dL (ref 8.9–10.3)
Chloride: 104 mmol/L (ref 98–111)
Creatinine, Ser: 1.49 mg/dL — ABNORMAL HIGH (ref 0.44–1.00)
GFR, Estimated: 36 mL/min — ABNORMAL LOW (ref >60)
Glucose, Bld: 96 mg/dL (ref 70–99)
Potassium: 4.4 mmol/L (ref 3.5–5.1)
Sodium: 143 mmol/L (ref 135–145)

## 2021-11-26 LAB — HEPATIC FUNCTION PANEL
ALT: 10 U/L (ref 0–44)
AST: 16 U/L (ref 15–41)
Albumin: 2.7 g/dL — ABNORMAL LOW (ref 3.5–5.0)
Alkaline Phosphatase: 55 U/L (ref 38–126)
Bilirubin, Direct: 0.2 mg/dL (ref 0.0–0.2)
Indirect Bilirubin: 0.6 mg/dL (ref 0.3–0.9)
Total Bilirubin: 0.8 mg/dL (ref 0.3–1.2)
Total Protein: 5.9 g/dL — ABNORMAL LOW (ref 6.5–8.1)

## 2021-11-26 LAB — CBC
HCT: 32.5 % — ABNORMAL LOW (ref 36.0–46.0)
Hemoglobin: 10.5 g/dL — ABNORMAL LOW (ref 12.0–15.0)
MCH: 29.2 pg (ref 26.0–34.0)
MCHC: 32.3 g/dL (ref 30.0–36.0)
MCV: 90.5 fL (ref 80.0–100.0)
Platelets: 240 10*3/uL (ref 150–400)
RBC: 3.59 MIL/uL — ABNORMAL LOW (ref 3.87–5.11)
RDW: 14.9 % (ref 11.5–15.5)
WBC: 4.8 10*3/uL (ref 4.0–10.5)
nRBC: 0 % (ref 0.0–0.2)

## 2021-11-26 LAB — LIPASE, BLOOD: Lipase: 27 U/L (ref 11–51)

## 2021-11-26 MED ORDER — FUROSEMIDE 10 MG/ML IJ SOLN
40.0000 mg | Freq: Once | INTRAMUSCULAR | Status: AC
  Administered 2021-11-26: 40 mg via INTRAVENOUS
  Filled 2021-11-26: qty 4

## 2021-11-26 MED ORDER — MORPHINE SULFATE (PF) 2 MG/ML IV SOLN
1.0000 mg | Freq: Once | INTRAVENOUS | Status: AC | PRN
  Administered 2021-11-26: 1 mg via INTRAVENOUS
  Filled 2021-11-26: qty 1

## 2021-11-26 MED ORDER — NALOXONE HCL 0.4 MG/ML IJ SOLN: 0.4000 mg | INTRAMUSCULAR | Status: DC | PRN

## 2021-11-26 MED ORDER — IOHEXOL 300 MG/ML  SOLN
100.0000 mL | Freq: Once | INTRAMUSCULAR | Status: AC | PRN
  Administered 2021-11-26: 100 mL

## 2021-11-26 NOTE — Progress Notes (Signed)
New and sudden onset abdominal pain, severe in nature. Patient states that she was lying awake watching TV with no obvious precipitating event. Not tender to palpation, bowel sounds normal, LBM 8/30, no obvious distention, bladder scan resulted 16. On-call hospitalist notified.

## 2021-11-26 NOTE — Progress Notes (Signed)
   11/26/21 0655  Assess: MEWS Score  Temp 98.6 F (37 C)  BP 103/84  MAP (mmHg) 92  ECG Heart Rate (!) 119  Resp 19  SpO2 100 %  O2 Device Nasal Cannula  O2 Flow Rate (L/min) 3 L/min  Assess: MEWS Score  MEWS Temp 0  MEWS Systolic 0  MEWS Pulse 2  MEWS RR 0  MEWS LOC 0  MEWS Score 2  MEWS Score Color Yellow  Assess: if the MEWS score is Yellow or Red  Were vital signs taken at a resting state? Yes  Focused Assessment Change from prior assessment (see assessment flowsheet)  Does the patient meet 2 or more of the SIRS criteria? Yes  Does the patient have a confirmed or suspected source of infection? No  MEWS guidelines implemented *See Row Information* Yes  Treat  MEWS Interventions Escalated (See documentation below)  Pain Scale 0-10  Pain Score 7  Take Vital Signs  Increase Vital Sign Frequency  Yellow: Q 2hr X 2 then Q 4hr X 2, if remains yellow, continue Q 4hrs  Escalate  MEWS: Escalate Yellow: discuss with charge nurse/RN and consider discussing with provider and RRT  Notify: Charge Nurse/RN  Name of Charge Nurse/RN Notified Jequ[place] RN  Date Charge Nurse/RN Notified 11/26/21  Time Charge Nurse/RN Notified 0200  Notify: Provider  Provider Name/Title Rathore  Date Provider Notified 11/26/21  Time Provider Notified 0200  Method of Notification Page  Notification Reason Change in status  Provider response See new orders;Evaluate remotely  Date of Provider Response 11/26/21  Time of Provider Response 0205  Document  Patient Outcome Not stable and remains on department  Progress note created (see row info) Yes  Assess: SIRS CRITERIA  SIRS Temperature  0  SIRS Pulse 1  SIRS Respirations  0  SIRS WBC 1  SIRS Score Sum  2

## 2021-11-26 NOTE — Progress Notes (Addendum)
Overnight event  Notified by RN that patient is complaining of new and sudden onset severe abdominal pain.  No obvious precipitating event.  Per RN, abdomen nontender to palpation with normal bowel sounds and no obvious distention.  Last bowel movement 8/30.  Bladder scan showing 16 cc.  Patient became tachycardic after the onset of her pain but remainder of vital signs stable. -Stat KUB ordered -Stat labs ordered including lipase, LFTs, CBC -Morphine ordered for severe pain  Update:   -Labs pending  -Abdominal x-ray showing: "IMPRESSION: Findings which may represent a distended, air-filled segment of sigmoid colon (i.e. " coffee bean sign"). Further evaluation with abdomen and pelvis CT is recommended to exclude the presence of a sigmoid volvulus."  -Stat CT abdomen pelvis ordered -Hold Eliquis -Keep n.p.o. -Discussed with general surgery (Dr. Zenia Resides), recommending consulting GI for endoscopic decompression if CT confirms sigmoid volvulus.   Addendum 11/26/2021 at 3:26 AM: Dr. Zenia Resides called me back, requesting CT abdomen pelvis with rectal contrast.

## 2021-11-26 NOTE — Progress Notes (Deleted)
   11/26/21 0655  Assess: MEWS Score  Temp 98.6 F (37 C)  BP 103/84  MAP (mmHg) 92  ECG Heart Rate (!) 119  Resp 19  SpO2 100 %  O2 Device Nasal Cannula  O2 Flow Rate (L/min) 3 L/min  Assess: MEWS Score  MEWS Temp 0  MEWS Systolic 0  MEWS Pulse 2  MEWS RR 0  MEWS LOC 0  MEWS Score 2  MEWS Score Color Yellow  Assess: if the MEWS score is Yellow or Red  Were vital signs taken at a resting state? Yes  Focused Assessment Change from prior assessment (see assessment flowsheet)  Does the patient meet 2 or more of the SIRS criteria? No  MEWS guidelines implemented *See Row Information* Yes  Treat  MEWS Interventions Escalated (See documentation below)  Pain Scale 0-10  Pain Score 7  Take Vital Signs  Increase Vital Sign Frequency  Yellow: Q 2hr X 2 then Q 4hr X 2, if remains yellow, continue Q 4hrs  Escalate  MEWS: Escalate Yellow: discuss with charge nurse/RN and consider discussing with provider and RRT  Notify: Charge Nurse/RN  Name of Charge Nurse/RN Notified Jequetta RN  Date Charge Nurse/RN Notified 11/26/21  Time Charge Nurse/RN Notified 0200  Notify: Provider  Provider Name/Title Rathore  Date Provider Notified 11/26/21  Time Provider Notified 0200  Method of Notification Page  Notification Reason Change in status  Provider response See new orders;Evaluate remotely  Date of Provider Response 11/26/21  Time of Provider Response 0205  Document  Patient Outcome Not stable and remains on department  Progress note created (see row info) Yes  Assess: SIRS CRITERIA  SIRS Temperature  0  SIRS Pulse 1  SIRS Respirations  0  SIRS WBC 1  SIRS Score Sum  2

## 2021-11-26 NOTE — Progress Notes (Signed)
Brief cardiology progress note: Had acute abdominal pain overnight, chart reviewed.  Reviewed telemetry, her tachycardia is sinus with rare PVCs. Would continue metoprolol.  Left recommendations in note from yesterday, still ok for discharge from cardiac perspective when her other medical issues are stabilized.   We will sign off, please contact with any new concerns.  Buford Dresser, MD, PhD, [facility] at [facility] [address], [place], [place] [address] [phone]

## 2021-11-26 NOTE — Progress Notes (Addendum)
PROGRESS NOTE    Brandy [name]  GDJ:[id number] DOB: [birth year] DOA: 11/23/2021 PCP: [name], MD  81/F with history of chronic systolic and diastolic CHF, paroxysmal A-fib, COPD on home O2, CVA with left hemiplegia, bedbound, saw [name] recently had an outpatient echo on 6/28 which noted EF of 20-25% with severe LVH, grade 3 DD and concern for cardiac amyloidosis. -Presented to the ED on 8/28 with dyspnea on exertion, edema, BNP was 1910, troponin in the 300s, chest x-ray noted pulmonary edema and bilateral pleural effusions -Improving with diuresis   Subjective: Feels better today, breathing is improving, eating breakfast  Assessment and Plan:  Acute on chronic systolic and diastolic CHF Biventricular heart failure -Recent outpatient echo noted EF 20-25%, severe LVH, grade 3 DD, moderately reduced RV function and myocardial pattern concerning for cardiac amyloidosis -Cath in 2019 noted mild nonobstructive CAD -Diuresed with IV Lasix on admission, I/os are incomplete, weight trending up, likely inaccurate -JVD elevated, and CT chest overnight with small pleural effusions, will repeat dose of IV Lasix today, started on low-dose Aldactone, poor candidate for SGLT2i with bedbound status -Elevated troponin is likely secondary to demand ischemia -Appreciate cardiology input, recommended outpatient follow-up for PYP scan, given worsening cardiomyopathy and very poor functional status will request palliative care eval  CKD 3 -Mild uptrend in creatinine, note not far from baseline, monitor  Abdominal pain -Occurred overnight and intermittently per family, patient points to lower pelvic region, CT with mildly distended rectosigmoid, no evidence of obstruction -Having BMs, no nausea or vomiting -Supportive care, also check UA and bladder scan  HTN (hypertension) -Continue metoprolol, decrease Imdur dose  PAF (paroxysmal atrial fibrillation) ([place]) -Continue metoprolol,  Eliquis  History of CVA Left hemiplegia, bed and wheelchair bound -Continue Eliquis and statin -Overall prognosis is poor  DVT prophylaxis: Eliquis Code Status: Full code, discussed CODE STATUS with patient yesterday, she would like to think about this further Family Communication: Spouse at bedside Disposition Plan: Home tomorrow if stable  Consultants: Cardiology   Procedures:   Antimicrobials:    Objective: Vitals:   11/26/21 0655 11/26/21 1000 11/26/21 1100 11/26/21 1211  BP: 103/84   94/63  Pulse:    100  Resp: '19 14 16 17  '$ Temp: 98.6 F (37 C)   98.6 F (37 C)  TempSrc: Oral   Oral  SpO2: 100%   96%  Weight:      Height:        Intake/Output Summary (Last 24 hours) at 11/26/2021 1223 Last data filed at 11/26/2021 0100 Gross per 24 hour  Intake 360 ml  Output 1 ml  Net 359 ml   Filed Weights   11/23/21 1417 11/24/21 0315  Weight: 57.2 kg 60.8 kg    Examination:  General exam: Chronically ill elderly female sitting up in bed, AAO x2, no distress, mild cognitive deficits HEENT: Positive JVD CVS: S1-S2, regular rhythm Lungs: Decreased breath sounds bases Abdomen: Soft, nontender, bowel sounds present Extremities: No edema  Neuro: Left hemiplegia Psychiatry: Poor insight    Data Reviewed:   CBC: Recent Labs  Lab 11/23/21 1707 11/24/21 0419 11/25/21 0631 11/26/21 0324  WBC 4.5 4.2 3.7* 4.8  NEUTROABS 2.9  --   --   --   HGB 10.7* 10.4* 9.9* 10.5*  HCT 35.0* 32.1* 30.7* 32.5*  MCV 95.4 90.4 90.8 90.5  PLT 201 187 204 622   Basic Metabolic Panel: Recent Labs  Lab 11/23/21 1707 11/24/21 0419 11/25/21 0631 11/26/21 0324  NA  141 143 144 143  K 4.2 3.5 2.9* 4.4  CL 107 107 108 104  CO2 '26 27 30 28  '$ GLUCOSE 86 82 80 96  BUN 31* 31* 32* 30*  CREATININE 1.38* 1.45* 1.50* 1.49*  CALCIUM 9.2 9.1 8.9 9.4  MG 1.7 1.6*  --   --    GFR: Estimated Creatinine Clearance: 26.4 mL/min (A) (by C-G formula based on SCr of 1.49 mg/dL (H)). Liver  Function Tests: Recent Labs  Lab 11/23/21 1707 11/26/21 0324  AST 20 16  ALT 7 10  ALKPHOS 55 55  BILITOT 0.9 0.8  PROT 5.9* 5.9*  ALBUMIN 2.7* 2.7*   Recent Labs  Lab 11/26/21 0324  LIPASE 27   No results for input(s): "AMMONIA" in the last 168 hours. Coagulation Profile: No results for input(s): "INR", "PROTIME" in the last 168 hours. Cardiac Enzymes: No results for input(s): "CKTOTAL", "CKMB", "CKMBINDEX", "TROPONINI" in the last 168 hours. BNP (last 3 results) No results for input(s): "PROBNP" in the last 8760 hours. HbA1C: No results for input(s): "HGBA1C" in the last 72 hours. CBG: No results for input(s): "GLUCAP" in the last 168 hours. Lipid Profile: No results for input(s): "CHOL", "HDL", "LDLCALC", "TRIG", "CHOLHDL", "LDLDIRECT" in the last 72 hours. Thyroid Function Tests: Recent Labs    11/24/21 0419  TSH 0.890   Anemia Panel: No results for input(s): "VITAMINB12", "FOLATE", "FERRITIN", "TIBC", "IRON", "RETICCTPCT" in the last 72 hours. Urine analysis:    Component Value Date/Time   COLORURINE YELLOW 10/10/2018 1951   APPEARANCEUR CLEAR 10/10/2018 1951   LABSPEC 1.014 10/10/2018 1951   PHURINE 5.0 10/10/2018 1951   GLUCOSEU NEGATIVE 10/10/2018 1951   HGBUR NEGATIVE 10/10/2018 [place] NEGATIVE 10/10/2018 [place] NEGATIVE 10/10/2018 1951   PROTEINUR NEGATIVE 10/10/2018 1951   NITRITE NEGATIVE 10/10/2018 1951   LEUKOCYTESUR NEGATIVE 10/10/2018 1951   Sepsis Labs: '@LABRCNTIP'$ (procalcitonin:4,lacticidven:4)  ) Recent Results (from the past 240 hour(s))  Resp Panel by RT-PCR (Flu A&B, Covid) Anterior Nasal Swab     Status: None   Collection Time: 11/23/21  3:26 PM   Specimen: Anterior Nasal Swab  Result Value Ref Range Status   SARS Coronavirus 2 by RT PCR NEGATIVE NEGATIVE Final    Comment: (NOTE) SARS-CoV-2 target nucleic acids are NOT DETECTED.  The SARS-CoV-2 RNA is generally detectable in upper respiratory specimens  during the acute phase of infection. The lowest concentration of SARS-CoV-2 viral copies this assay can detect is 138 copies/mL. A negative result does not preclude SARS-Cov-2 infection and should not be used as the sole basis for treatment or other patient management decisions. A negative result may occur with  improper specimen collection/handling, submission of specimen other than nasopharyngeal swab, presence of viral mutation(s) within the areas targeted by this assay, and inadequate number of viral copies(<138 copies/mL). A negative result must be combined with clinical observations, patient history, and epidemiological information. The expected result is Negative.  Fact Sheet for Patients:  EntrepreneurPulse.com.au  Fact Sheet for Healthcare Providers:  IncredibleEmployment.be  This test is no t yet approved or cleared by the [place] FDA and  has been authorized for detection and/or diagnosis of SARS-CoV-2 by FDA under an Emergency Use Authorization (EUA). This EUA will remain  in effect (meaning this test can be used) for the duration of the COVID-19 declaration under Section 564(b)(1) of the Act, 21 U.S.C.section 360bbb-3(b)(1), unless the authorization is terminated  or revoked sooner.       Influenza A  by PCR NEGATIVE NEGATIVE Final   Influenza B by PCR NEGATIVE NEGATIVE Final    Comment: (NOTE) The Xpert Xpress SARS-CoV-2/FLU/RSV plus assay is intended as an aid in the diagnosis of influenza from Nasopharyngeal swab specimens and should not be used as a sole basis for treatment. Nasal washings and aspirates are unacceptable for Xpert Xpress SARS-CoV-2/FLU/RSV testing.  Fact Sheet for Patients: EntrepreneurPulse.com.au  Fact Sheet for Healthcare Providers: IncredibleEmployment.be  This test is not yet approved or cleared by the [place] FDA and has been authorized for detection  and/or diagnosis of SARS-CoV-2 by FDA under an Emergency Use Authorization (EUA). This EUA will remain in effect (meaning this test can be used) for the duration of the COVID-19 declaration under Section 564(b)(1) of the Act, 21 U.S.C. section 360bbb-3(b)(1), unless the authorization is terminated or revoked.  Performed at [facility], [place] [address], [place], [place] [address]      Radiology Studies: CT ABDOMEN PELVIS WO CONTRAST  Result Date: 11/26/2021 CLINICAL DATA:  Abdominal pain, acute, nonlocalized. General surgery requesting CT with rectal contrast to evaluate for trapped air in sigmoid colon. EXAM: CT ABDOMEN AND PELVIS WITHOUT CONTRAST TECHNIQUE: Multidetector CT imaging of the abdomen and pelvis was performed following the standard protocol without IV contrast. RADIATION DOSE REDUCTION: This exam was performed according to the departmental dose-optimization program which includes automated exposure control, adjustment of the mA and/or kV according to patient size and/or use of iterative reconstruction technique. COMPARISON:  11/26/2021. FINDINGS: Lower chest: Heart is enlarged and there is a trace pericardial effusion. Small pleural effusions are noted at the lung bases bilaterally. Hepatobiliary: A cyst is noted in the anterior right lobe of the liver measuring 3 cm. A 1.2 cm cyst is present in the posterior right lobe of the liver. Additional subcentimeter hypodensities are identified which are too small to further characterize. The gallbladder is without stones. No biliary ductal dilatation. Pancreas: Unremarkable. No pancreatic ductal dilatation or surrounding inflammatory changes. Spleen: Normal in size without focal abnormality. Adrenals/Urinary Tract: The adrenal glands are within normal limits. A cyst is present in the upper pole of the left kidney. No renal calculus or hydronephrosis. The bladder is unremarkable. Stomach/Bowel: A rectal tube is present and contrast is  identified in the colon. There is mild gaseous distention of the rectosigmoid colon without evidence of obstruction. There is mild rectal wall thickening with surrounding fat stranding in the presacral space. A normal appendix is seen in the right lower quadrant. Multiple scattered diverticula are noted along the colon without evidence of diverticulitis. There is a small hiatal hernia. Stomach is otherwise within normal limits. No free air or pneumatosis. Vascular/Lymphatic: Aortic atherosclerosis. No enlarged abdominal or pelvic lymph nodes. Reproductive: Uterus and bilateral adnexa are unremarkable. Other: No abdominopelvic ascites. Musculoskeletal: Mild subcutaneous fat stranding is noted in the lateral abdominal walls and hips bilaterally, greater on the left than on the right. Degenerative changes in the thoracolumbar spine. No acute osseous abnormality. IMPRESSION: 1. Mildly distended gas-filled rectosigmoid colon with no evidence of obstruction. 2. Mild rectal wall thickening with surrounding fat stranding suggesting proctitis. 3. Diverticulosis without diverticulitis. 4. Small bilateral pleural effusions. 5. Small hiatal hernia. 6. Hepatic cysts. 7. Cardiomegaly with small pericardial effusion. 8. Aortic atherosclerosis. 9. Subcutaneous fat stranding in the lateral abdominal and hip walls, possible edema. Electronically Signed   By: Brett Fairy M.D.   On: 11/26/2021 04:50   DG Abd 1 View  Result Date: 11/26/2021 CLINICAL DATA:  Abdominal pain. EXAM: ABDOMEN - 1 VIEW COMPARISON:  December 05, 2019 FINDINGS: A short segment of prominent, air-filled bowel is seen overlying the medial aspect of the mid to lower right abdomen. Normal caliber large and small bowel loops are seen throughout the remainder of the abdomen. There is no evidence of free air. No radio-opaque calculi or other significant radiographic abnormality are seen. IMPRESSION: Findings which may represent a distended, air-filled segment of  sigmoid colon (i.e. " coffee bean sign"). Further evaluation with abdomen and pelvis CT is recommended to exclude the presence of a sigmoid volvulus. Electronically Signed   By: Virgina Norfolk M.D.   On: 11/26/2021 02:54     Scheduled Meds:  atorvastatin  10 mg Oral QHS   citalopram  10 mg Oral Daily   diclofenac Sodium  2 g Topical QID   metoprolol succinate  50 mg Oral Daily   spironolactone  12.5 mg Oral Daily   Continuous Infusions:   LOS: 3 days    Time spent: 63mn  PDomenic Polite MD [facility]   11/26/2021, 12:23 PM

## 2021-11-26 NOTE — Telephone Encounter (Signed)
Rx request sent to pharmacy.  

## 2021-11-27 DIAGNOSIS — I509 Heart failure, unspecified: Secondary | ICD-10-CM | POA: Diagnosis not present

## 2021-11-27 DIAGNOSIS — I5041 Acute combined systolic (congestive) and diastolic (congestive) heart failure: Secondary | ICD-10-CM | POA: Diagnosis not present

## 2021-11-27 DIAGNOSIS — I69354 Hemiplegia and hemiparesis following cerebral infarction affecting left non-dominant side: Secondary | ICD-10-CM | POA: Diagnosis not present

## 2021-11-27 DIAGNOSIS — Z7189 Other specified counseling: Secondary | ICD-10-CM

## 2021-11-27 DIAGNOSIS — Z515 Encounter for palliative care: Secondary | ICD-10-CM | POA: Diagnosis not present

## 2021-11-27 DIAGNOSIS — N1832 Chronic kidney disease, stage 3b: Secondary | ICD-10-CM | POA: Diagnosis not present

## 2021-11-27 LAB — CBC
HCT: 34.9 % — ABNORMAL LOW (ref 36.0–46.0)
Hemoglobin: 11.3 g/dL — ABNORMAL LOW (ref 12.0–15.0)
MCH: 29.7 pg (ref 26.0–34.0)
MCHC: 32.4 g/dL (ref 30.0–36.0)
MCV: 91.6 fL (ref 80.0–100.0)
Platelets: 260 10*3/uL (ref 150–400)
RBC: 3.81 MIL/uL — ABNORMAL LOW (ref 3.87–5.11)
RDW: 14.9 % (ref 11.5–15.5)
WBC: 5.3 10*3/uL (ref 4.0–10.5)
nRBC: 0 % (ref 0.0–0.2)

## 2021-11-27 LAB — COMPREHENSIVE METABOLIC PANEL
ALT: 10 U/L (ref 0–44)
AST: 16 U/L (ref 15–41)
Albumin: 2.7 g/dL — ABNORMAL LOW (ref 3.5–5.0)
Alkaline Phosphatase: 59 U/L (ref 38–126)
Anion gap: 9 (ref 5–15)
BUN: 34 mg/dL — ABNORMAL HIGH (ref 8–23)
CO2: 28 mmol/L (ref 22–32)
Calcium: 9.4 mg/dL (ref 8.9–10.3)
Chloride: 106 mmol/L (ref 98–111)
Creatinine, Ser: 1.56 mg/dL — ABNORMAL HIGH (ref 0.44–1.00)
GFR, Estimated: 34 mL/min — ABNORMAL LOW (ref >60)
Glucose, Bld: 92 mg/dL (ref 70–99)
Potassium: 4.7 mmol/L (ref 3.5–5.1)
Sodium: 143 mmol/L (ref 135–145)
Total Bilirubin: 0.7 mg/dL (ref 0.3–1.2)
Total Protein: 6.2 g/dL — ABNORMAL LOW (ref 6.5–8.1)

## 2021-11-27 MED ORDER — FUROSEMIDE 40 MG PO TABS
40.0000 mg | ORAL_TABLET | Freq: Every day | ORAL | Status: DC
  Administered 2021-11-27 – 2021-11-28 (×2): 40 mg via ORAL
  Filled 2021-11-27 (×2): qty 1

## 2021-11-27 MED ORDER — DICYCLOMINE HCL 10 MG PO CAPS
10.0000 mg | ORAL_CAPSULE | Freq: Two times a day (BID) | ORAL | Status: DC | PRN
  Administered 2021-11-27 – 2021-11-28 (×3): 10 mg via ORAL
  Filled 2021-11-27 (×3): qty 1

## 2021-11-27 NOTE — Progress Notes (Signed)
ANTICOAGULATION CONSULT NOTE -Follow up  Pharmacy Consult for Heparin infusion>>apixaban Indication: chest pain/ACS  Allergies  Allergen Reactions   Lisinopril Swelling   Jardiance [Empagliflozin] Rash    Patient Measurements: Height: '5\' 4"'$  (162.6 cm) Weight: 60.5 kg (133 lb 6.1 oz) IBW/kg (Calculated) : 54.7 Heparin Dosing Weight: 57.2 kg  Vital Signs: Temp: 97.6 F (36.4 C) (09/01 0844) Temp Source: Oral (09/01 0844) BP: 109/86 (09/01 0844) Pulse Rate: 97 (09/01 0844)  Labs: Recent Labs    11/24/21 1501 11/25/21 0631 11/25/21 0631 11/26/21 0324 11/27/21 0548  HGB  --  9.9*   < > 10.5* 11.3*  HCT  --  30.7*  --  32.5* 34.9*  PLT  --  204  --  240 260  APTT 65*  --   --   --   --   HEPARINUNFRC >1.10*  --   --   --   --   CREATININE  --  1.50*  --  1.49* 1.56*   < > = values in this interval not displayed.     Estimated Creatinine Clearance: 25.3 mL/min (A) (by C-G formula based on SCr of 1.56 mg/dL (H)).   Medical History: Past Medical History:  Diagnosis Date   Cardiomyopathy    a. EF of 25% in 07/2006 b. EF normalized by repeat echo in 2011 c. EF 35-40% by echo in 05/2017 with cath showing mild nonobstructive CAD   Chronic combined systolic and diastolic CHF (congestive heart failure) (HCC)    Chronic combined systolic and diastolic heart failure (HCC)    Colon polyps    Diverticulosis of colon    Hypertension    Hypertensive heart disease    Osteopenia    Paroxysmal atrial fibrillation ([place]) 10/10/2018   Stroke ([place])    TIA (transient ischemic attack)    Tobacco abuse    50 pack year    Medications:  Medications Prior to Admission  Medication Sig Dispense Refill Last Dose   acetaminophen (TYLENOL) 325 MG tablet Take 2 tablets (650 mg total) by mouth every 4 (four) hours as needed for mild pain (or temp > 37.5 C (99.5 F)). (Patient taking differently: Take 650 mg by mouth every 6 (six) hours as needed for mild pain or moderate pain.)   Past Week    ascorbic acid (VITAMIN C) 500 MG tablet Take 500 mg by mouth daily.   11/23/2021   augmented betamethasone dipropionate (DIPROLENE-AF) 0.05 % ointment Apply 1 Application topically daily as needed (For rash).   11/22/2021   carvedilol (COREG) 6.25 MG tablet Take 1 tablet (6.25 mg total) by mouth 2 (two) times daily with a meal. 180 tablet 3 11/23/2021 at 1000   cetirizine (ZYRTEC) 10 MG tablet Take 10 mg by mouth daily as needed for allergies.   Past Month   citalopram (CELEXA) 10 MG tablet Take 10 mg by mouth daily.   11/23/2021   ELIQUIS 2.5 MG TABS tablet Take 1 tablet (2.5 mg total) by mouth 2 (two) times daily. 60 tablet  11/23/2021 at 1000   furosemide (LASIX) 20 MG tablet Take 1 tablet (20 mg total) by mouth daily. 90 tablet 3 11/23/2021   isosorbide mononitrate (IMDUR) 30 MG 24 hr tablet TAKE 1/2 TABLET(15 MG) BY MOUTH DAILY (Patient taking differently: Take 15 mg by mouth daily.) 45 tablet 2 11/23/2021   COVID-19 mRNA Vac-TriS, Pfizer, (PFIZER-BIONT COVID-19 VAC-TRIS) SUSP injection Inject into the muscle. 0.3 mL 0    pantoprazole (PROTONIX) 40 MG tablet Take  1 tablet (40 mg total) by mouth daily before breakfast. (Patient not taking: Reported on 11/23/2021) 30 tablet 1 Not Taking    Assessment: 81 yo F presented with worsening SOB, despite increased home O2 requirements. Pt takes apixaban 2.'5mg'$  po BID prior to admission for paroxysmal Afib, and h/o stroke. Pharmacy consulted 11/24/21 to restart apixaban.   We resumed  prior to admission Apixaban 2.5 mg PO BID.  H/H low stable , pltc wnl stable, no bleeding noted.  SCr 1.38>>1.56,  close to baseline per MDs note.   Apixaban dose is appropriate.    Plan:  Continue Apixaban 2.5 mg PO  BID Pharmacy will sign off  Thank you for allowing pharmacy to be part of this patients care team. Nicole Cella, [facility] Pharmacist 11/27/2021 2:47 PM  Please utilize Amion for appropriate phone number to reach the unit pharmacist ([place])

## 2021-11-27 NOTE — Consult Note (Signed)
Palliative Care Consult Note                                  Date: 11/27/2021   Patient Name: Brandy [name]  DOB: 04-16-1940  MRN: [id number]  Age / Sex: 81 y.o., female  PCP: [name], MD Referring Physician: Domenic Polite, MD  Reason for Consultation: Establishing goals of care  HPI/Patient Profile: 81 y.o. female  with past medical history of chronic systolic and diastolic CHF, paroxysmal A-fib, COPD on home O2, and CVA with left hemiplegia.  Outpatient echo on 6/28 showed EF of 20 to 25% with severe LVH, grade 3 diastolic dysfunction, and concern for cardiac amyloidosis.  She presented to the ED on 11/23/2021 with dyspnea on exertion.  BNP was 1910, troponin in the 300s, and chest x-ray showed pulmonary edema and bilateral pleural effusions.  She has improved with diuresis.  Admitted to [facility] with acute on chronic systolic and diastolic heart failure. Palliative medicine consulted to discuss goals of care  Past Medical History:  Diagnosis Date   Cardiomyopathy    a. EF of 25% in 07/2006 b. EF normalized by repeat echo in 2011 c. EF 35-40% by echo in 05/2017 with cath showing mild nonobstructive CAD   Chronic combined systolic and diastolic CHF (congestive heart failure) (HCC)    Chronic combined systolic and diastolic heart failure (HCC)    Colon polyps    Diverticulosis of colon    Hypertension    Hypertensive heart disease    Osteopenia    Paroxysmal atrial fibrillation ([place]) 10/10/2018   Stroke ([place])    TIA (transient ischemic attack)    Tobacco abuse    50 pack year    Subjective:   I have reviewed medical records including EPIC notes, labs and imaging.  I met today at bedside with patient and her husband Jayce to discuss diagnosis, prognosis, GOC, EOL wishes, disposition, and options.  Patient is awake/alert and intermittently participates in the discussion, but most of the conversation was had with her husband.   I  introduced Palliative Medicine as specialized medical care for people living with serious illness. It focuses on providing relief from the symptoms and stress of a serious illness.   A brief life review was discussed.  Navya and Michael Boston have been married for 44 years. 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  They have 1 daughter Cyril Mourning.  Patient and her husband live in their home in [place].  They have private caregivers 6 days a week, for varying hours.  At baseline, patient is bedbound and completely dependent for all ADLs.  We discussed patient's current illness and what it means in the larger context of her ongoing co-morbidities. Current clinical status was reviewed. Chesley tells me she is in the hospital for shortness of breath and Jace tells me it is due to "fluid", but neither are able to verbalize that patient has heart failure.  I provided education that Elsey has advanced heart failure with a low ejection fraction of 20 to 25%.  Discussed that heart failure is a progressive and noncurable illness.  Discussed that fluid overload would be a recurrent issue and that at some point diuresis would not be effective.   Created space and opportunity for patient and family to explore thoughts and feelings regarding current medical situation. Michael Boston initially tells me that his wish  is for Darcie to be able to walk again.  We discussed that unfortunately this is not a realistic goal for her.  After further consideration, Jayce expresses he wants Ellan to be peaceful and comfortable.  The difference between full scope medical intervention and comfort care was considered.  I introduced the concept of a comfort path, emphasizing that this path involves de-escalating and stopping full scope medical interventions, allowing a natural course to occur. Discussed that the goal is comfort and dignity rather than cure/prolonging life.  Introduced hospice  philosophy and provided information on home hospice services.  Jayce does not seem to be ready for hospice, but he is agreeable to outpatient palliative.  We did discuss code status. Encouraged patient  and husband to consider DNR/DNI status understanding evidenced based poor outcomes in similar hospitalized patients, as the cause of the arrest is likely associated with chronic/terminal disease rather than a reversible acute cardio-pulmonary event. I explained that DNR/DNI  is a protective measure to keep us from harming the patient in their last moments of life.  Jayce states they need to discuss further as a family and is not ready to make a decision today.  I gave him a copy of "hard choices" book and encouraged him to read the chapter on CPR.  Per husband's request, I also spoke with their daughter Kristen by phone and summarized the discussion above.  Kristen is receptive to the concept of hospice and DNR status, and plans to discuss further with her parents.  Family encouraged to call with questions or concerns.     Review of Systems  All other systems reviewed and are negative.   Objective:   Primary Diagnoses: Present on Admission:  HTN (hypertension)  CKD (chronic kidney disease) stage 3, GFR 30-59 ml/min (HCC)  PAF (paroxysmal atrial fibrillation) (HCC)   Physical Exam Vitals reviewed.  Constitutional:      General: She is not in acute distress.    Comments: Frail and chronically ill-appearing  Pulmonary:     Effort: Pulmonary effort is normal.  Neurological:     Mental Status: She is alert.     Motor: Weakness present.     Comments: Left hemiplegia     Vital Signs:  BP 109/86 (BP Location: Right Arm)   Pulse 97   Temp 97.6 F (36.4 C) (Oral)   Resp 16   Ht 5' 4" (1.626 m)   Wt 60.5 kg   SpO2 97%   BMI 22.89 kg/m   Palliative Assessment/Data: PPS 30%     Assessment & Plan:   SUMMARY OF RECOMMENDATIONS   Full code Continue full scope  interventions Outpatient palliative referral (through hospice of [place])  Primary Decision Maker: Patient seems to have impaired decision-making and needs family support for any major decisions  Prognosis:  < 6 months would not be surprising  Discharge Planning:  Home with Palliative Services   Discussed with: Dr. Joseph   Thank you for allowing us to participate in the care of Takoda H Erdmann   Time Total: 92 minutes  Greater than 50%  of this time was spent counseling and coordinating care related to the above assessment and plan.  Signed by:  , NP Palliative Medicine Team  Team Phone # [phone]  For individual providers, please see AMION                

## 2021-11-27 NOTE — Progress Notes (Signed)
PROGRESS NOTE    TZIVIA ONEIL  QMV:[id number] DOB: 1940-04-04 DOA: 11/23/2021 PCP: [name], MD  81/F with history of chronic systolic and diastolic CHF, paroxysmal A-fib, COPD on home O2, CVA with left hemiplegia, bedbound, saw [name] recently had an outpatient echo on 6/28 which noted EF of 20-25% with severe LVH, grade 3 DD and concern for cardiac amyloidosis. -Presented to the ED on 8/28 with dyspnea on exertion, edema, BNP was 1910, troponin in the 300s, chest x-ray noted pulmonary edema and bilateral pleural effusions -Improving with diuresis   Subjective: Feels better today, breathing is improving, eating breakfast  Assessment and Plan:  Acute on chronic systolic and diastolic CHF Biventricular heart failure -Recent outpatient echo noted EF 20-25%, severe LVH, grade 3 DD, moderately reduced RV function and myocardial pattern concerning for cardiac amyloidosis -Cath in 2019 noted mild nonobstructive CAD -Diuresed with IV Lasix on admission, I/os are incomplete, weight trending up, likely inaccurate -JVD elevated, and CT chest overnight with small pleural effusions, change Lasix to p.o., started on low-dose Aldactone, poor candidate for SGLT2i with bedbound status -Elevated troponin is likely secondary to demand ischemia -Appreciate cardiology input, recommended outpatient follow-up for PYP scan, given worsening cardiomyopathy and very poor functional status, i requested a  palliative care eval  CKD 3 -Mild uptrend in creatinine, note not far from baseline, monitor  Abdominal pain Chronic abdominal pain rib oriented per family -Occurred overnight and intermittently per family, patient points to lower pelvic region, CT with mildly distended rectosigmoid, no evidence of obstruction -Having BMs, no nausea or vomiting -Supportive care, trial of dicyclomine  HTN (hypertension) -Continue metoprolol, decrease Imdur dose  PAF (paroxysmal atrial fibrillation)  (HCC) -Continue metoprolol, Eliquis  History of CVA Left hemiplegia, bed and wheelchair bound -Continue Eliquis and statin -Overall prognosis is poor  DVT prophylaxis: Eliquis Code Status: Full code, discussed CODE STATUS with patient and spouse, they wanted to think about this further Family Communication: Spouse at bedside Disposition Plan: Home tomorrow if stable  Consultants: Cardiology   Procedures:   Antimicrobials:    Objective: Vitals:   11/26/21 2030 11/27/21 0532 11/27/21 0844 11/27/21 1000  BP: 105/78 123/87 109/86   Pulse: (!) 101 92 97   Resp: 19 17 (!) 23 16  Temp: 98 F (36.7 C) 98.1 F (36.7 C) 97.6 F (36.4 C)   TempSrc: Oral Oral Oral   SpO2: 100% 100% 97%   Weight:  60.5 kg    Height:        Intake/Output Summary (Last 24 hours) at 11/27/2021 1215 Last data filed at 11/26/2021 2031 Gross per 24 hour  Intake 630 ml  Output 250 ml  Net 380 ml   Filed Weights   11/23/21 1417 11/24/21 0315 11/27/21 0532  Weight: 57.2 kg 60.8 kg 60.5 kg    Examination:  General exam: Chronically ill elderly female sitting up in bed, AAO x2, no distress, mild cognitive deficits HEENT: Positive JVD CVS: S1-S2, regular rhythm Lungs: Decreased breath sounds bases Abdomen: Soft, nontender, bowel sounds present Extremities: No edema  Neuro: Left hemiplegia Psychiatry: Poor insight    Data Reviewed:   CBC: Recent Labs  Lab 11/23/21 1707 11/24/21 0419 11/25/21 0631 11/26/21 0324 11/27/21 0548  WBC 4.5 4.2 3.7* 4.8 5.3  NEUTROABS 2.9  --   --   --   --   HGB 10.7* 10.4* 9.9* 10.5* 11.3*  HCT 35.0* 32.1* 30.7* 32.5* 34.9*  MCV 95.4 90.4 90.8 90.5 91.6  PLT 201  187 204 240 401   Basic Metabolic Panel: Recent Labs  Lab 11/23/21 1707 11/24/21 0419 11/25/21 0631 11/26/21 0324 11/27/21 0548  NA 141 143 144 143 143  K 4.2 3.5 2.9* 4.4 4.7  CL 107 107 108 104 106  CO2 '26 27 30 28 28  '$ GLUCOSE 86 82 80 96 92  BUN 31* 31* 32* 30* 34*  CREATININE  1.38* 1.45* 1.50* 1.49* 1.56*  CALCIUM 9.2 9.1 8.9 9.4 9.4  MG 1.7 1.6*  --   --   --    GFR: Estimated Creatinine Clearance: 25.3 mL/min (A) (by C-G formula based on SCr of 1.56 mg/dL (H)). Liver Function Tests: Recent Labs  Lab 11/23/21 1707 11/26/21 0324 11/27/21 0548  AST '20 16 16  '$ ALT '7 10 10  '$ ALKPHOS 55 55 59  BILITOT 0.9 0.8 0.7  PROT 5.9* 5.9* 6.2*  ALBUMIN 2.7* 2.7* 2.7*   Recent Labs  Lab 11/26/21 0324  LIPASE 27   No results for input(s): "AMMONIA" in the last 168 hours. Coagulation Profile: No results for input(s): "INR", "PROTIME" in the last 168 hours. Cardiac Enzymes: No results for input(s): "CKTOTAL", "CKMB", "CKMBINDEX", "TROPONINI" in the last 168 hours. BNP (last 3 results) No results for input(s): "PROBNP" in the last 8760 hours. HbA1C: No results for input(s): "HGBA1C" in the last 72 hours. CBG: No results for input(s): "GLUCAP" in the last 168 hours. Lipid Profile: No results for input(s): "CHOL", "HDL", "LDLCALC", "TRIG", "CHOLHDL", "LDLDIRECT" in the last 72 hours. Thyroid Function Tests: No results for input(s): "TSH", "T4TOTAL", "FREET4", "T3FREE", "THYROIDAB" in the last 72 hours.  Anemia Panel: No results for input(s): "VITAMINB12", "FOLATE", "FERRITIN", "TIBC", "IRON", "RETICCTPCT" in the last 72 hours. Urine analysis:    Component Value Date/Time   COLORURINE YELLOW 10/10/2018 1951   APPEARANCEUR CLEAR 10/10/2018 1951   LABSPEC 1.014 10/10/2018 1951   PHURINE 5.0 10/10/2018 1951   GLUCOSEU NEGATIVE 10/10/2018 1951   HGBUR NEGATIVE 10/10/2018 [place] NEGATIVE 10/10/2018 [place] NEGATIVE 10/10/2018 1951   PROTEINUR NEGATIVE 10/10/2018 1951   NITRITE NEGATIVE 10/10/2018 1951   LEUKOCYTESUR NEGATIVE 10/10/2018 1951   Sepsis Labs: '@LABRCNTIP'$ (procalcitonin:4,lacticidven:4)  ) Recent Results (from the past 240 hour(s))  Resp Panel by RT-PCR (Flu A&B, Covid) Anterior Nasal Swab     Status: None   Collection  Time: 11/23/21  3:26 PM   Specimen: Anterior Nasal Swab  Result Value Ref Range Status   SARS Coronavirus 2 by RT PCR NEGATIVE NEGATIVE Final    Comment: (NOTE) SARS-CoV-2 target nucleic acids are NOT DETECTED.  The SARS-CoV-2 RNA is generally detectable in upper respiratory specimens during the acute phase of infection. The lowest concentration of SARS-CoV-2 viral copies this assay can detect is 138 copies/mL. A negative result does not preclude SARS-Cov-2 infection and should not be used as the sole basis for treatment or other patient management decisions. A negative result may occur with  improper specimen collection/handling, submission of specimen other than nasopharyngeal swab, presence of viral mutation(s) within the areas targeted by this assay, and inadequate number of viral copies(<138 copies/mL). A negative result must be combined with clinical observations, patient history, and epidemiological information. The expected result is Negative.  Fact Sheet for Patients:  EntrepreneurPulse.com.au  Fact Sheet for Healthcare Providers:  IncredibleEmployment.be  This test is no t yet approved or cleared by the [place] FDA and  has been authorized for detection and/or diagnosis of SARS-CoV-2 by FDA under an Emergency Use Authorization (  EUA). This EUA will remain  in effect (meaning this test can be used) for the duration of the COVID-19 declaration under Section 564(b)(1) of the Act, 21 U.S.C.section 360bbb-3(b)(1), unless the authorization is terminated  or revoked sooner.       Influenza A by PCR NEGATIVE NEGATIVE Final   Influenza B by PCR NEGATIVE NEGATIVE Final    Comment: (NOTE) The Xpert Xpress SARS-CoV-2/FLU/RSV plus assay is intended as an aid in the diagnosis of influenza from Nasopharyngeal swab specimens and should not be used as a sole basis for treatment. Nasal washings and aspirates are unacceptable for Xpert Xpress  SARS-CoV-2/FLU/RSV testing.  Fact Sheet for Patients: EntrepreneurPulse.com.au  Fact Sheet for Healthcare Providers: IncredibleEmployment.be  This test is not yet approved or cleared by the [place] FDA and has been authorized for detection and/or diagnosis of SARS-CoV-2 by FDA under an Emergency Use Authorization (EUA). This EUA will remain in effect (meaning this test can be used) for the duration of the COVID-19 declaration under Section 564(b)(1) of the Act, 21 U.S.C. section 360bbb-3(b)(1), unless the authorization is terminated or revoked.  Performed at [facility], [place] [address], [place], [place] [address]      Radiology Studies: CT ABDOMEN PELVIS WO CONTRAST  Result Date: 11/26/2021 CLINICAL DATA:  Abdominal pain, acute, nonlocalized. General surgery requesting CT with rectal contrast to evaluate for trapped air in sigmoid colon. EXAM: CT ABDOMEN AND PELVIS WITHOUT CONTRAST TECHNIQUE: Multidetector CT imaging of the abdomen and pelvis was performed following the standard protocol without IV contrast. RADIATION DOSE REDUCTION: This exam was performed according to the departmental dose-optimization program which includes automated exposure control, adjustment of the mA and/or kV according to patient size and/or use of iterative reconstruction technique. COMPARISON:  11/26/2021. FINDINGS: Lower chest: Heart is enlarged and there is a trace pericardial effusion. Small pleural effusions are noted at the lung bases bilaterally. Hepatobiliary: A cyst is noted in the anterior right lobe of the liver measuring 3 cm. A 1.2 cm cyst is present in the posterior right lobe of the liver. Additional subcentimeter hypodensities are identified which are too small to further characterize. The gallbladder is without stones. No biliary ductal dilatation. Pancreas: Unremarkable. No pancreatic ductal dilatation or surrounding inflammatory changes. Spleen:  Normal in size without focal abnormality. Adrenals/Urinary Tract: The adrenal glands are within normal limits. A cyst is present in the upper pole of the left kidney. No renal calculus or hydronephrosis. The bladder is unremarkable. Stomach/Bowel: A rectal tube is present and contrast is identified in the colon. There is mild gaseous distention of the rectosigmoid colon without evidence of obstruction. There is mild rectal wall thickening with surrounding fat stranding in the presacral space. A normal appendix is seen in the right lower quadrant. Multiple scattered diverticula are noted along the colon without evidence of diverticulitis. There is a small hiatal hernia. Stomach is otherwise within normal limits. No free air or pneumatosis. Vascular/Lymphatic: Aortic atherosclerosis. No enlarged abdominal or pelvic lymph nodes. Reproductive: Uterus and bilateral adnexa are unremarkable. Other: No abdominopelvic ascites. Musculoskeletal: Mild subcutaneous fat stranding is noted in the lateral abdominal walls and hips bilaterally, greater on the left than on the right. Degenerative changes in the thoracolumbar spine. No acute osseous abnormality. IMPRESSION: 1. Mildly distended gas-filled rectosigmoid colon with no evidence of obstruction. 2. Mild rectal wall thickening with surrounding fat stranding suggesting proctitis. 3. Diverticulosis without diverticulitis. 4. Small bilateral pleural effusions. 5. Small hiatal hernia. 6. Hepatic cysts. 7. Cardiomegaly  with small pericardial effusion. 8. Aortic atherosclerosis. 9. Subcutaneous fat stranding in the lateral abdominal and hip walls, possible edema. Electronically Signed   By: Brett Fairy M.D.   On: 11/26/2021 04:50   DG Abd 1 View  Result Date: 11/26/2021 CLINICAL DATA:  Abdominal pain. EXAM: ABDOMEN - 1 VIEW COMPARISON:  December 05, 2019 FINDINGS: A short segment of prominent, air-filled bowel is seen overlying the medial aspect of the mid to lower right  abdomen. Normal caliber large and small bowel loops are seen throughout the remainder of the abdomen. There is no evidence of free air. No radio-opaque calculi or other significant radiographic abnormality are seen. IMPRESSION: Findings which may represent a distended, air-filled segment of sigmoid colon (i.e. " coffee bean sign"). Further evaluation with abdomen and pelvis CT is recommended to exclude the presence of a sigmoid volvulus. Electronically Signed   By: Virgina Norfolk M.D.   On: 11/26/2021 02:54     Scheduled Meds:  atorvastatin  10 mg Oral QHS   citalopram  10 mg Oral Daily   diclofenac Sodium  2 g Topical QID   metoprolol succinate  50 mg Oral Daily   spironolactone  12.5 mg Oral Daily   Continuous Infusions:   LOS: 4 days    Time spent: 46mn  PDomenic Polite MD [facility]   11/27/2021, 12:15 PM

## 2021-11-27 NOTE — Progress Notes (Signed)
Patient complaint of cramping abdominal pain 6/10.Md made aware.

## 2021-11-28 DIAGNOSIS — I5041 Acute combined systolic (congestive) and diastolic (congestive) heart failure: Secondary | ICD-10-CM

## 2021-11-28 LAB — BASIC METABOLIC PANEL
Anion gap: 10 (ref 5–15)
BUN: 39 mg/dL — ABNORMAL HIGH (ref 8–23)
CO2: 27 mmol/L (ref 22–32)
Calcium: 9.2 mg/dL (ref 8.9–10.3)
Chloride: 105 mmol/L (ref 98–111)
Creatinine, Ser: 1.66 mg/dL — ABNORMAL HIGH (ref 0.44–1.00)
GFR, Estimated: 31 mL/min — ABNORMAL LOW (ref >60)
Glucose, Bld: 96 mg/dL (ref 70–99)
Potassium: 4.5 mmol/L (ref 3.5–5.1)
Sodium: 142 mmol/L (ref 135–145)

## 2021-11-28 MED ORDER — METOPROLOL SUCCINATE ER 50 MG PO TB24: 50.0000 mg | ORAL_TABLET | Freq: Every day | ORAL | 0 refills | Status: DC

## 2021-11-28 MED ORDER — SENNOSIDES-DOCUSATE SODIUM 8.6-50 MG PO TABS: 1.0000 | ORAL_TABLET | Freq: Every day | ORAL | Status: DC

## 2021-11-28 MED ORDER — DICYCLOMINE HCL 10 MG PO CAPS: 10.0000 mg | ORAL_CAPSULE | Freq: Two times a day (BID) | ORAL | 0 refills | Status: AC | PRN

## 2021-11-28 MED ORDER — FUROSEMIDE 40 MG PO TABS: 40.0000 mg | ORAL_TABLET | Freq: Every day | ORAL | 0 refills | Status: DC

## 2021-11-28 MED ORDER — SPIRONOLACTONE 25 MG PO TABS: 12.5000 mg | ORAL_TABLET | Freq: Every day | ORAL | 0 refills | Status: DC

## 2021-11-28 NOTE — Discharge Summary (Signed)
Physician Discharge Summary  Brandy [name] EQA:[id number] DOB: 10-14-1940 DOA: 11/23/2021  PCP: [name], MD  Admit date: 11/23/2021 Discharge date: 11/28/2021  Time spent: 45 minutes  Recommendations for Outpatient Follow-up:  Cardiology [name] in 2 weeks Palliative care to follow-up at home, overall prognosis is poor, continue goals of care discussions   Discharge Diagnoses:  Principal Problem: Acute on chronic systolic and diastolic CHF, EF 97% Possible cardiac amyloidosis History of CVA with left hemiplegia Bedbound COPD Chronic respiratory failure on 2 L home O2 Severe protein calorie malnutrition   CKD (chronic kidney disease) stage 3, GFR 30-59 ml/min (HCC)   HTN (hypertension)   PAF (paroxysmal atrial fibrillation) (HCC)   Hemiparesis affecting left side as late effect of stroke [facility])   Discharge Condition: Stable  Diet recommendation: Low-sodium, heart healthy  Filed Weights   11/24/21 0315 11/27/21 0532 11/28/21 0414  Weight: 60.8 kg 60.5 kg 59.3 kg    History of present illness:  81/F with history of chronic systolic and diastolic CHF, paroxysmal A-fib, COPD on home O2, CVA with left hemiplegia, bedbound, saw [name] recently had an outpatient echo on 6/28 which noted EF of 20-25% with severe LVH, grade 3 DD and concern for cardiac amyloidosis. -Presented to the ED on 8/28 with dyspnea on exertion, edema, BNP was 1910, troponin in the 300s, chest x-ray noted pulmonary edema and bilateral pleural effusions  Hospital Course:   Acute on chronic systolic and diastolic CHF Biventricular heart failure -Recent outpatient echo noted EF 20-25%, severe LVH, grade 3 DD, moderately reduced RV function and myocardial pattern concerning for cardiac amyloidosis -Cath in 2019 noted mild nonobstructive CAD -Diuresed with IV Lasix on admission, I/os are incomplete, weight inaccurate -Volume status has improved,  started on low-dose Aldactone, continue oral Lasix,  poor candidate for SGLT2i with bedbound status -Seen by cardiology in consultation, recommended outpatient follow-up for PYP scan. -given worsening cardiomyopathy and very poor functional status, i requested a  palliative care eval, patient and spouse want to discuss CODE STATUS and goals of care with other family further, palliative care follow-up arranged at discharge   CKD 3b -Stable   Abdominal pain Chronic abdominal pain reported per family -Occurred intermittently and chronic per family, patient points to lower pelvic region, CT with mildly distended rectosigmoid, no evidence of obstruction, no urinary retention noted -Having BMs, no nausea or vomiting -Supportive care, discharged home on as needed dicyclomine and nightly Senokot  COPD/chronic respiratory failure -On 2 L home O2, stable   HTN (hypertension) -Continue metoprolol, decrease Imdur dose   PAF (paroxysmal atrial fibrillation) (HCC) -Continue metoprolol, Eliquis   History of CVA Left hemiplegia, bed and wheelchair bound -Continue Eliquis and statin -Overall prognosis is poor  Severe protein calorie malnutrition -Supplements advised  Consultations: Cardiology, palliative care  Discharge Exam: Vitals:   11/28/21 0414 11/28/21 0929  BP: 121/85 109/88  Pulse: 95 92  Resp: 20 19  Temp: 97.7 F (36.5 C) 98.6 F (37 C)  SpO2: 99% 98%   General exam: Chronically ill elderly female sitting up in bed, AAO x2, no distress, mild cognitive deficits HEENT: Positive JVD CVS: S1-S2, regular rhythm Lungs: Decreased breath sounds bases Abdomen: Soft, nontender, bowel sounds present Extremities: No edema  Neuro: Left hemiplegia Psychiatry: Poor insight  Discharge Instructions   Discharge Instructions     Amb Referral to Palliative Care   Complete by: As directed    Poor prognosis, worsening cardiomyopathy with stroke, hemiplegia and very poor functional  status   Diet - low sodium heart healthy   Complete by:  As directed    Increase activity slowly   Complete by: As directed       Allergies as of 11/28/2021       Reactions   Lisinopril Swelling   Jardiance [empagliflozin] Rash        Medication List     STOP taking these medications    carvedilol 6.25 MG tablet Commonly known as: COREG   isosorbide mononitrate 30 MG 24 hr tablet Commonly known as: IMDUR   Pfizer-BioNT COVID-19 Vac-TriS Susp injection Generic drug: COVID-19 mRNA Vac-TriS [employer])       TAKE these medications    acetaminophen 325 MG tablet Commonly known as: TYLENOL Take 2 tablets (650 mg total) by mouth every 4 (four) hours as needed for mild pain (or temp > 37.5 C (99.5 F)). What changed:  when to take this reasons to take this   ascorbic acid 500 MG tablet Commonly known as: VITAMIN C Take 500 mg by mouth daily.   atorvastatin 10 MG tablet Commonly known as: LIPITOR TAKE 1 TABLET(10 MG) BY MOUTH DAILY What changed: See the new instructions. Notes to patient: Take tonight @ bedtime 11/28/21   augmented betamethasone dipropionate 0.05 % ointment Commonly known as: DIPROLENE-AF Apply 1 Application topically daily as needed (For rash).   cetirizine 10 MG tablet Commonly known as: ZYRTEC Take 10 mg by mouth daily as needed for allergies.   citalopram 10 MG tablet Commonly known as: CELEXA Take 10 mg by mouth daily. Notes to patient: Take tomorrow morning 11/29/21   dicyclomine 10 MG capsule Commonly known as: BENTYL Take 1 capsule (10 mg total) by mouth 2 (two) times daily as needed for up to 10 days for spasms (for abd pain, cramps).   Eliquis 2.5 MG Tabs tablet Generic drug: apixaban Take 1 tablet (2.5 mg total) by mouth 2 (two) times daily. Notes to patient: Take this evening 11/28/21   furosemide 40 MG tablet Commonly known as: LASIX Take 1 tablet (40 mg total) by mouth daily. What changed:  medication strength how much to take Notes to patient: Take tomorrow morning 11/29/21    metoprolol succinate 50 MG 24 hr tablet Commonly known as: TOPROL-XL Take 1 tablet (50 mg total) by mouth daily. Take with or immediately following a meal.   pantoprazole 40 MG tablet Commonly known as: PROTONIX Take 1 tablet (40 mg total) by mouth daily before breakfast. Notes to patient: Take tomorrow morning 11/29/21   spironolactone 25 MG tablet Commonly known as: ALDACTONE Take 0.5 tablets (12.5 mg total) by mouth daily. Notes to patient: Take tomorrow morning 11/29/21       Allergies  Allergen Reactions   Lisinopril Swelling   Jardiance [Empagliflozin] Rash      The results of significant diagnostics from this hospitalization (including imaging, microbiology, ancillary and laboratory) are listed below for reference.    Significant Diagnostic Studies: CT ABDOMEN PELVIS WO CONTRAST  Result Date: 11/26/2021 CLINICAL DATA:  Abdominal pain, acute, nonlocalized. General surgery requesting CT with rectal contrast to evaluate for trapped air in sigmoid colon. EXAM: CT ABDOMEN AND PELVIS WITHOUT CONTRAST TECHNIQUE: Multidetector CT imaging of the abdomen and pelvis was performed following the standard protocol without IV contrast. RADIATION DOSE REDUCTION: This exam was performed according to the departmental dose-optimization program which includes automated exposure control, adjustment of the mA and/or kV according to patient size and/or use of iterative reconstruction technique. COMPARISON:  11/26/2021. FINDINGS: Lower chest: Heart is enlarged and there is a trace pericardial effusion. Small pleural effusions are noted at the lung bases bilaterally. Hepatobiliary: A cyst is noted in the anterior right lobe of the liver measuring 3 cm. A 1.2 cm cyst is present in the posterior right lobe of the liver. Additional subcentimeter hypodensities are identified which are too small to further characterize. The gallbladder is without stones. No biliary ductal dilatation. Pancreas: Unremarkable. No  pancreatic ductal dilatation or surrounding inflammatory changes. Spleen: Normal in size without focal abnormality. Adrenals/Urinary Tract: The adrenal glands are within normal limits. A cyst is present in the upper pole of the left kidney. No renal calculus or hydronephrosis. The bladder is unremarkable. Stomach/Bowel: A rectal tube is present and contrast is identified in the colon. There is mild gaseous distention of the rectosigmoid colon without evidence of obstruction. There is mild rectal wall thickening with surrounding fat stranding in the presacral space. A normal appendix is seen in the right lower quadrant. Multiple scattered diverticula are noted along the colon without evidence of diverticulitis. There is a small hiatal hernia. Stomach is otherwise within normal limits. No free air or pneumatosis. Vascular/Lymphatic: Aortic atherosclerosis. No enlarged abdominal or pelvic lymph nodes. Reproductive: Uterus and bilateral adnexa are unremarkable. Other: No abdominopelvic ascites. Musculoskeletal: Mild subcutaneous fat stranding is noted in the lateral abdominal walls and hips bilaterally, greater on the left than on the right. Degenerative changes in the thoracolumbar spine. No acute osseous abnormality. IMPRESSION: 1. Mildly distended gas-filled rectosigmoid colon with no evidence of obstruction. 2. Mild rectal wall thickening with surrounding fat stranding suggesting proctitis. 3. Diverticulosis without diverticulitis. 4. Small bilateral pleural effusions. 5. Small hiatal hernia. 6. Hepatic cysts. 7. Cardiomegaly with small pericardial effusion. 8. Aortic atherosclerosis. 9. Subcutaneous fat stranding in the lateral abdominal and hip walls, possible edema. Electronically Signed   By: Brett Fairy M.D.   On: 11/26/2021 04:50   DG Abd 1 View  Result Date: 11/26/2021 CLINICAL DATA:  Abdominal pain. EXAM: ABDOMEN - 1 VIEW COMPARISON:  December 05, 2019 FINDINGS: A short segment of prominent, air-filled  bowel is seen overlying the medial aspect of the mid to lower right abdomen. Normal caliber large and small bowel loops are seen throughout the remainder of the abdomen. There is no evidence of free air. No radio-opaque calculi or other significant radiographic abnormality are seen. IMPRESSION: Findings which may represent a distended, air-filled segment of sigmoid colon (i.e. " coffee bean sign"). Further evaluation with abdomen and pelvis CT is recommended to exclude the presence of a sigmoid volvulus. Electronically Signed   By: Virgina Norfolk M.D.   On: 11/26/2021 02:54   DG Chest Portable 1 View  Result Date: 11/23/2021 CLINICAL DATA:  Dyspnea. Worsening shortness of breath over the last several days. EXAM: PORTABLE CHEST 1 VIEW COMPARISON:  Radiographs 01/29/2020 and 01/21/2020.  CT 09/14/2017. FINDINGS: 1532 hours. The lower left chest is partially excluded from this portable examination. The heart is enlarged but stable. There is aortic atherosclerosis. Moderate bilateral pleural effusions with pulmonary edema, similar to prior radiographs. No evidence of pneumothorax. Previous cervical fusion without evidence of acute osseous abnormality. IMPRESSION: Cardiomegaly, pulmonary edema and bilateral pleural effusions consistent with recurrent congestive heart failure. Electronically Signed   By: Richardean Sale M.D.   On: 11/23/2021 15:42    Microbiology: Recent Results (from the past 240 hour(s))  Resp Panel by RT-PCR (Flu A&B, Covid) Anterior Nasal Swab     Status: None  Collection Time: 11/23/21  3:26 PM   Specimen: Anterior Nasal Swab  Result Value Ref Range Status   SARS Coronavirus 2 by RT PCR NEGATIVE NEGATIVE Final    Comment: (NOTE) SARS-CoV-2 target nucleic acids are NOT DETECTED.  The SARS-CoV-2 RNA is generally detectable in upper respiratory specimens during the acute phase of infection. The lowest concentration of SARS-CoV-2 viral copies this assay can detect is 138 copies/mL.  A negative result does not preclude SARS-Cov-2 infection and should not be used as the sole basis for treatment or other patient management decisions. A negative result may occur with  improper specimen collection/handling, submission of specimen other than nasopharyngeal swab, presence of viral mutation(s) within the areas targeted by this assay, and inadequate number of viral copies(<138 copies/mL). A negative result must be combined with clinical observations, patient history, and epidemiological information. The expected result is Negative.  Fact Sheet for Patients:  EntrepreneurPulse.com.au  Fact Sheet for Healthcare Providers:  IncredibleEmployment.be  This test is no t yet approved or cleared by the [place] FDA and  has been authorized for detection and/or diagnosis of SARS-CoV-2 by FDA under an Emergency Use Authorization (EUA). This EUA will remain  in effect (meaning this test can be used) for the duration of the COVID-19 declaration under Section 564(b)(1) of the Act, 21 U.S.C.section 360bbb-3(b)(1), unless the authorization is terminated  or revoked sooner.       Influenza A by PCR NEGATIVE NEGATIVE Final   Influenza B by PCR NEGATIVE NEGATIVE Final    Comment: (NOTE) The Xpert Xpress SARS-CoV-2/FLU/RSV plus assay is intended as an aid in the diagnosis of influenza from Nasopharyngeal swab specimens and should not be used as a sole basis for treatment. Nasal washings and aspirates are unacceptable for Xpert Xpress SARS-CoV-2/FLU/RSV testing.  Fact Sheet for Patients: EntrepreneurPulse.com.au  Fact Sheet for Healthcare Providers: IncredibleEmployment.be  This test is not yet approved or cleared by the [place] FDA and has been authorized for detection and/or diagnosis of SARS-CoV-2 by FDA under an Emergency Use Authorization (EUA). This EUA will remain in effect (meaning this test  can be used) for the duration of the COVID-19 declaration under Section 564(b)(1) of the Act, 21 U.S.C. section 360bbb-3(b)(1), unless the authorization is terminated or revoked.  Performed at [facility], [place] [address], [place], [place] [address]      Labs: Basic Metabolic Panel: Recent Labs  Lab 11/23/21 1707 11/24/21 0419 11/25/21 0631 11/26/21 0324 11/27/21 0548 11/28/21 0319  NA 141 143 144 143 143 142  K 4.2 3.5 2.9* 4.4 4.7 4.5  CL 107 107 108 104 106 105  CO2 '26 27 30 28 28 27  '$ GLUCOSE 86 82 80 96 92 96  BUN 31* 31* 32* 30* 34* 39*  CREATININE 1.38* 1.45* 1.50* 1.49* 1.56* 1.66*  CALCIUM 9.2 9.1 8.9 9.4 9.4 9.2  MG 1.7 1.6*  --   --   --   --    Liver Function Tests: Recent Labs  Lab 11/23/21 1707 11/26/21 0324 11/27/21 0548  AST '20 16 16  '$ ALT '7 10 10  '$ ALKPHOS 55 55 59  BILITOT 0.9 0.8 0.7  PROT 5.9* 5.9* 6.2*  ALBUMIN 2.7* 2.7* 2.7*   Recent Labs  Lab 11/26/21 0324  LIPASE 27   No results for input(s): "AMMONIA" in the last 168 hours. CBC: Recent Labs  Lab 11/23/21 1707 11/24/21 0419 11/25/21 0631 11/26/21 0324 11/27/21 0548  WBC 4.5 4.2 3.7* 4.8 5.3  NEUTROABS  2.9  --   --   --   --   HGB 10.7* 10.4* 9.9* 10.5* 11.3*  HCT 35.0* 32.1* 30.7* 32.5* 34.9*  MCV 95.4 90.4 90.8 90.5 91.6  PLT 201 187 204 240 260   Cardiac Enzymes: No results for input(s): "CKTOTAL", "CKMB", "CKMBINDEX", "TROPONINI" in the last 168 hours. BNP: BNP (last 3 results) Recent Labs    11/23/21 1707  BNP 1,910.5*    ProBNP (last 3 results) No results for input(s): "PROBNP" in the last 8760 hours.  CBG: No results for input(s): "GLUCAP" in the last 168 hours.     Signed:  Domenic Polite MD.  [facility] 11/28/2021, 10:47 AM

## 2021-12-01 ENCOUNTER — Encounter (HOSPITAL_COMMUNITY): Payer: Self-pay

## 2021-12-01 ENCOUNTER — Other Ambulatory Visit: Payer: Self-pay

## 2021-12-01 ENCOUNTER — Emergency Department (HOSPITAL_COMMUNITY): Payer: Medicare PPO

## 2021-12-01 ENCOUNTER — Emergency Department (HOSPITAL_COMMUNITY)
Admission: EM | Admit: 2021-12-01 | Discharge: 2021-12-01 | Disposition: A | Discharge status: Home / Self Care | Payer: Medicare PPO | Attending: Emergency Medicine | Admitting: Emergency Medicine

## 2021-12-01 DIAGNOSIS — I509 Heart failure, unspecified: Secondary | ICD-10-CM | POA: Diagnosis not present

## 2021-12-01 DIAGNOSIS — Z7901 Long term (current) use of anticoagulants: Secondary | ICD-10-CM | POA: Insufficient documentation

## 2021-12-01 DIAGNOSIS — R1084 Generalized abdominal pain: Secondary | ICD-10-CM | POA: Insufficient documentation

## 2021-12-01 DIAGNOSIS — J90 Pleural effusion, not elsewhere classified: Secondary | ICD-10-CM | POA: Diagnosis not present

## 2021-12-01 DIAGNOSIS — R109 Unspecified abdominal pain: Secondary | ICD-10-CM | POA: Diagnosis present

## 2021-12-01 LAB — URINALYSIS, ROUTINE W REFLEX MICROSCOPIC
Bilirubin Urine: NEGATIVE
Glucose, UA: NEGATIVE mg/dL
Hgb urine dipstick: NEGATIVE
Ketones, ur: NEGATIVE mg/dL
Leukocytes,Ua: NEGATIVE
Nitrite: NEGATIVE
Protein, ur: 30 mg/dL — AB
Specific Gravity, Urine: 1.013 (ref 1.005–1.030)
pH: 5 (ref 5.0–8.0)

## 2021-12-01 LAB — CBC WITH DIFFERENTIAL/PLATELET
Abs Immature Granulocytes: 0.02 10*3/uL (ref 0.00–0.07)
Basophils Absolute: 0.1 10*3/uL (ref 0.0–0.1)
Basophils Relative: 1 %
Eosinophils Absolute: 0.1 10*3/uL (ref 0.0–0.5)
Eosinophils Relative: 1 %
HCT: 36.4 % (ref 36.0–46.0)
Hemoglobin: 11.3 g/dL — ABNORMAL LOW (ref 12.0–15.0)
Immature Granulocytes: 0 %
Lymphocytes Relative: 27 %
Lymphs Abs: 1.6 10*3/uL (ref 0.7–4.0)
MCH: 29.1 pg (ref 26.0–34.0)
MCHC: 31 g/dL (ref 30.0–36.0)
MCV: 93.8 fL (ref 80.0–100.0)
Monocytes Absolute: 0.5 10*3/uL (ref 0.1–1.0)
Monocytes Relative: 8 %
Neutro Abs: 3.8 10*3/uL (ref 1.7–7.7)
Neutrophils Relative %: 63 %
Platelets: 275 10*3/uL (ref 150–400)
RBC: 3.88 MIL/uL (ref 3.87–5.11)
RDW: 15.5 % (ref 11.5–15.5)
WBC: 6 10*3/uL (ref 4.0–10.5)
nRBC: 1 % — ABNORMAL HIGH (ref 0.0–0.2)

## 2021-12-01 LAB — COMPREHENSIVE METABOLIC PANEL
ALT: 20 U/L (ref 0–44)
AST: 30 U/L (ref 15–41)
Albumin: 3.2 g/dL — ABNORMAL LOW (ref 3.5–5.0)
Alkaline Phosphatase: 65 U/L (ref 38–126)
Anion gap: 12 (ref 5–15)
BUN: 52 mg/dL — ABNORMAL HIGH (ref 8–23)
CO2: 24 mmol/L (ref 22–32)
Calcium: 9.2 mg/dL (ref 8.9–10.3)
Chloride: 105 mmol/L (ref 98–111)
Creatinine, Ser: 1.98 mg/dL — ABNORMAL HIGH (ref 0.44–1.00)
GFR, Estimated: 25 mL/min — ABNORMAL LOW (ref >60)
Glucose, Bld: 127 mg/dL — ABNORMAL HIGH (ref 70–99)
Potassium: 4.3 mmol/L (ref 3.5–5.1)
Sodium: 141 mmol/L (ref 135–145)
Total Bilirubin: 0.8 mg/dL (ref 0.3–1.2)
Total Protein: 6.5 g/dL (ref 6.5–8.1)

## 2021-12-01 LAB — LIPASE, BLOOD: Lipase: 27 U/L (ref 11–51)

## 2021-12-01 LAB — MAGNESIUM: Magnesium: 1.8 mg/dL (ref 1.7–2.4)

## 2021-12-01 LAB — LACTIC ACID, PLASMA: Lactic Acid, Venous: 1.9 mmol/L (ref 0.5–1.9)

## 2021-12-01 LAB — TROPONIN I (HIGH SENSITIVITY)
Troponin I (High Sensitivity): 235 ng/L (ref <18)
Troponin I (High Sensitivity): 234 ng/L (ref <18)

## 2021-12-01 LAB — BRAIN NATRIURETIC PEPTIDE: B Natriuretic Peptide: 3703 pg/mL — ABNORMAL HIGH (ref 0.0–100.0)

## 2021-12-01 MED ORDER — SENNOSIDES-DOCUSATE SODIUM 8.6-50 MG PO TABS: 1.0000 | ORAL_TABLET | Freq: Every day | ORAL | 0 refills | Status: AC

## 2021-12-01 MED ORDER — FUROSEMIDE 10 MG/ML IJ SOLN
40.0000 mg | Freq: Once | INTRAMUSCULAR | Status: AC
  Administered 2021-12-01: 40 mg via INTRAVENOUS
  Filled 2021-12-01: qty 4

## 2021-12-01 NOTE — Discharge Instructions (Signed)
Continue to take the bentyl.  Take the senokot.

## 2021-12-01 NOTE — ED Provider Notes (Signed)
Pt signed out by Dr. Betsey Holiday pending labs and symptomatic improvement.  UA neg; cmp with CKD (cr 1.98), lip 27; bnp is 3703 which is slightly elevated from her baseline.  Trop is elevated at 234 and lower than normal.  Pt and her husband said her breathing is at baseline.  She does not feel any worse breathing-wise.  However, she did get lasix 40 mg iv while here and breathing looks better.  Pt said her abd pain is much better.  She has chronic abd pain and is supposed to be taking bentyl and senokot.  Pt is encouraged to take this.  She is also told to f/u with GI.  Return if worse.      Isla Pence, MD 12/01/21 1216

## 2021-12-01 NOTE — ED Provider Notes (Signed)
[facility] EMERGENCY DEPARTMENT Provider Note   CSN: [id number] Arrival date & time: 12/01/21  0455     History {Add pertinent medical, surgical, social history, OB history to HPI:1} Chief Complaint  Patient presents with   Abdominal Pain    Brandy [name] is a 81 y.o. female.  Presents to the emergency department for evaluation of abdominal pain.  Patient reports that the pain began yesterday.  She has had constant mid abdominal pain.  Patient denies nausea, vomiting, diarrhea.       Home Medications Prior to Admission medications   Medication Sig Start Date End Date Taking? Authorizing Provider  acetaminophen (TYLENOL) 325 MG tablet Take 2 tablets (650 mg total) by mouth every 4 (four) hours as needed for mild pain (or temp > 37.5 C (99.5 F)). Patient taking differently: Take 650 mg by mouth every 6 (six) hours as needed for mild pain or moderate pain. 03/16/18   Angiulli, Lavon Paganini, PA-C  ascorbic acid (VITAMIN C) 500 MG tablet Take 500 mg by mouth daily.    [provider]  atorvastatin (LIPITOR) 10 MG tablet TAKE 1 TABLET(10 MG) BY MOUTH DAILY 11/26/21   Skeet Latch, MD  augmented betamethasone dipropionate (DIPROLENE-AF) 0.05 % ointment Apply 1 Application topically daily as needed (For rash). 11/16/21   [provider]  cetirizine (ZYRTEC) 10 MG tablet Take 10 mg by mouth daily as needed for allergies.    [provider]  citalopram (CELEXA) 10 MG tablet Take 10 mg by mouth daily.    [provider]  dicyclomine (BENTYL) 10 MG capsule Take 1 capsule (10 mg total) by mouth 2 (two) times daily as needed for up to 10 days for spasms (for abd pain, cramps). 11/28/21 12/08/21  Domenic Polite, MD  ELIQUIS 2.5 MG TABS tablet Take 1 tablet (2.5 mg total) by mouth 2 (two) times daily. 01/26/20   Johnson, Clanford L, MD  furosemide (LASIX) 40 MG tablet Take 1 tablet (40 mg total) by mouth daily. 11/28/21   Domenic Polite, MD  metoprolol succinate  (TOPROL-XL) 50 MG 24 hr tablet Take 1 tablet (50 mg total) by mouth daily. Take with or immediately following a meal. 11/28/21   Domenic Polite, MD  pantoprazole (PROTONIX) 40 MG tablet Take 1 tablet (40 mg total) by mouth daily before breakfast. Patient not taking: Reported on 11/23/2021 01/25/20   Irwin Brakeman L, MD  senna-docusate (SENOKOT-S) 8.6-50 MG tablet Take 1 tablet by mouth at bedtime. 11/28/21   Domenic Polite, MD  spironolactone (ALDACTONE) 25 MG tablet Take 0.5 tablets (12.5 mg total) by mouth daily. 11/28/21   Domenic Polite, MD      Allergies    Lisinopril and Jardiance [empagliflozin]    Review of Systems   Review of Systems  Physical Exam Updated Vital Signs BP (!) 106/95   Pulse 83   Resp 17   Ht '5\' 4"'$  (1.626 m)   Wt 59.3 kg   SpO2 96%   BMI 22.44 kg/m  Physical Exam Vitals and nursing note reviewed.  Constitutional:      General: She is not in acute distress.    Appearance: She is well-developed.  HENT:     Head: Normocephalic and atraumatic.     Mouth/Throat:     Mouth: Mucous membranes are moist.  Eyes:     General: Vision grossly intact. Gaze aligned appropriately.     Extraocular Movements: Extraocular movements intact.     Conjunctiva/sclera: Conjunctivae normal.  Cardiovascular:  Rate and Rhythm: Normal rate and regular rhythm.     Pulses: Normal pulses.     Heart sounds: Normal heart sounds, S1 normal and S2 normal. No murmur heard.    No friction rub. No gallop.  Pulmonary:     Effort: Pulmonary effort is normal. No respiratory distress.     Breath sounds: Normal breath sounds.  Abdominal:     General: Bowel sounds are normal.     Palpations: Abdomen is soft.     Tenderness: There is generalized abdominal tenderness. There is no guarding or rebound.     Hernia: No hernia is present.  Musculoskeletal:        General: No swelling.     Cervical back: Full passive range of motion without pain, normal range of motion and neck supple. No  spinous process tenderness or muscular tenderness. Normal range of motion.     Right lower leg: No edema.     Left lower leg: No edema.  Skin:    General: Skin is warm and dry.     Capillary Refill: Capillary refill takes less than 2 seconds.     Findings: No ecchymosis, erythema, rash or wound.  Neurological:     General: No focal deficit present.     Mental Status: She is alert and oriented to person, place, and time.     GCS: GCS eye subscore is 4. GCS verbal subscore is 5. GCS motor subscore is 6.     Cranial Nerves: Cranial nerves 2-12 are intact.     Sensory: Sensation is intact.     Motor: Motor function is intact.     Coordination: Coordination is intact.  Psychiatric:        Attention and Perception: Attention normal.        Mood and Affect: Mood normal.        Speech: Speech normal.        Behavior: Behavior normal.     ED Results / Procedures / Treatments   Labs (all labs ordered are listed, but only abnormal results are displayed) Labs Reviewed  CBC WITH DIFFERENTIAL/PLATELET - Abnormal; Notable for the following components:      Result Value   Hemoglobin 11.3 (*)    nRBC 1.0 (*)    All other components within normal limits  COMPREHENSIVE METABOLIC PANEL - Abnormal; Notable for the following components:   Glucose, Bld 127 (*)    BUN 52 (*)    Creatinine, Ser 1.98 (*)    Albumin 3.2 (*)    GFR, Estimated 25 (*)    All other components within normal limits  LIPASE, BLOOD  LACTIC ACID, PLASMA  BRAIN NATRIURETIC PEPTIDE  TROPONIN I (HIGH SENSITIVITY)    EKG EKG Interpretation  Date/Time:  Tuesday December 01 2021 06:49:23 EDT Ventricular Rate:  75 PR Interval:  189 QRS Duration: 106 QT Interval:  378 QTC Calculation: 423 R Axis:   -61 Text Interpretation: Sinus rhythm Left anterior fascicular block Anterior infarct, old Borderline repolarization abnormality Confirmed by Orpah Greek [phone]) on 12/01/2021 6:51:55 AM  Radiology DG Chest Port 1  View  Result Date: 12/01/2021 CLINICAL DATA:  Heart failure. EXAM: PORTABLE CHEST 1 VIEW COMPARISON:  Portable chest 11/23/2021 FINDINGS: 6:51 a.m. There are multiple overlying monitor wires. There is mild cardiomegaly. Perihilar vascular congestion and mild central interstitial edema continue to be seen, with moderate layering pleural effusions. There are opacities of the left-greater-than-right lung bases which could be atelectasis or pneumonic consolidation. The  upper lung fields remain generally clear. Overall aeration is unchanged. There is aortic tortuosity and arthrosclerosis. Mid to lower thoracic dextroscoliosis and osteopenia. Three level lower cervical ACDF plating. IMPRESSION: Cardiomegaly, mild central edema and moderate pleural effusions with overlying opacities on the left-greater-than-right. Overall aeration and edema seem unchanged. Electronically Signed   By: Telford Nab M.D.   On: 12/01/2021 07:13    Procedures Procedures  {Document cardiac monitor, telemetry assessment procedure when appropriate:1}  Medications Ordered in ED Medications - No data to display  ED Course/ Medical Decision Making/ A&P                           Medical Decision Making Amount and/or Complexity of Data Reviewed Labs: ordered. Radiology: ordered.   Patient presents to the emergency department complaining of abdominal pain.  Patient indicates diffuse lower and mid upper abdominal pain.  Exam was nonspecific, no guarding, rebound present on examination.  I reviewed her prior records.  Patient was just hospitalized several days ago, discharged 2 days ago.  During the hospital stay she was treated for congestive heart failure exacerbation.  She did have abdominal pain during the hospital stay.  This was well documented as chronic in nature.  She did have a CT scan during her hospital stay that did not show any acute abnormality.  Based on this I do not feel she requires repeat imaging.  Will check  troponin and BNP as well as chest x-ray to evaluate for CHF status.  {Document critical care time when appropriate:1} {Document review of labs and clinical decision tools ie heart score, Chads2Vasc2 etc:1}  {Document your independent review of radiology images, and any outside records:1} {Document your discussion with family members, caretakers, and with consultants:1} {Document social determinants of health affecting pt's care:1} {Document your decision making why or why not admission, treatments were needed:1} Final Clinical Impression(s) / ED Diagnoses Final diagnoses:  Generalized abdominal pain    Rx / DC Orders ED Discharge Orders     None

## 2021-12-01 NOTE — Progress Notes (Signed)
AP A07 [facility] Liaison note:  Notified via [place] from Dr. Domenic Polite of request for [place] services. Will continue to follow for disposition.  Please call with any outpatient palliative questions or concerns.  Thank you for the opportunity to participate in this patient's care.  Thank you, Lorelee Market, LPN [facility] Liaison [phone]

## 2021-12-01 NOTE — ED Triage Notes (Signed)
Rcems from home cc of  RLQ abdominal pain since tonight.  Wears 4l [place] chronically. Chf makes it difficult to talk- prefers to nod

## 2021-12-02 ENCOUNTER — Ambulatory Visit (HOSPITAL_BASED_OUTPATIENT_CLINIC_OR_DEPARTMENT_OTHER): Payer: Medicare PPO | Admitting: Cardiovascular Disease

## 2021-12-03 ENCOUNTER — Telehealth (HOSPITAL_BASED_OUTPATIENT_CLINIC_OR_DEPARTMENT_OTHER): Payer: Self-pay | Admitting: Cardiovascular Disease

## 2021-12-03 ENCOUNTER — Telehealth: Payer: Self-pay | Admitting: Nurse Practitioner

## 2021-12-03 NOTE — Telephone Encounter (Signed)
Attempted to reach patient/husband Brandy [name] to offer to schedule a Palliative Consult at the home #:  [phone] and cell #: [phone], with no answer - I was able to leave a message on the cell # requesting a return call to schedule visit.

## 2021-12-03 NOTE — Telephone Encounter (Signed)
Patient's daughter called in cancelling test for tomorrow due to feeling she cannot make it through that long of a test. She is wanting to know if she'll be okay without this testing or if there is an alternative test she can do that is not as long. Please advise.

## 2021-12-04 ENCOUNTER — Ambulatory Visit (HOSPITAL_COMMUNITY)
Admission: RE | Admit: 2021-12-04 | Payer: Medicare PPO | Source: Ambulatory Visit | Attending: Cardiovascular Disease | Admitting: Cardiovascular Disease

## 2021-12-04 NOTE — Telephone Encounter (Signed)
Patient cancelled myo amyloid, is there something else you would like [place] to schedule her for?

## 2021-12-07 ENCOUNTER — Encounter (HOSPITAL_BASED_OUTPATIENT_CLINIC_OR_DEPARTMENT_OTHER): Payer: Self-pay

## 2021-12-07 ENCOUNTER — Telehealth (HOSPITAL_COMMUNITY): Payer: Self-pay | Admitting: Cardiovascular Disease

## 2021-12-07 NOTE — Telephone Encounter (Signed)
Patients daughter called and cancelled Brandy [name] due to reason below:  12/03/2021 4:42 PM MO:LMBE, ASHLEY N  Cancel Rsn: Conflict/Need to Reschedule (Daughter doesn't feel she is able to do test due to how long it is)  Order will be removed from the active [place].

## 2021-12-07 NOTE — Telephone Encounter (Signed)
We are aware, MD notified

## 2021-12-07 NOTE — Telephone Encounter (Signed)
See duplicate encounter.

## 2021-12-07 NOTE — Telephone Encounter (Signed)
Daughter would like to know if the patient will still need to have her follow-up appointment tomorrow (9/12) or will she need to reschedule as the patient did not have test on 9/8 because the patient was too weak.

## 2021-12-07 NOTE — Telephone Encounter (Signed)
Skeet Latch, MD to Gerald Stabs, RN  Me      12/05/21  7:35 AM An MRI would be a long time lying flat as well.  We can get check serum and urine protein electrophoresis with immunofixation   Patient has follow up scheduled for tomorrow, can discuss with [place] Mam NP at time of visit. Patient was placed on Palliative care at d/c from hospital

## 2021-12-08 ENCOUNTER — Encounter (HOSPITAL_BASED_OUTPATIENT_CLINIC_OR_DEPARTMENT_OTHER): Payer: Self-pay | Admitting: Family

## 2021-12-08 ENCOUNTER — Telehealth (INDEPENDENT_AMBULATORY_CARE_PROVIDER_SITE_OTHER): Payer: Medicare PPO | Admitting: Family

## 2021-12-08 VITALS — BP 132/94 | HR 91 | Ht 64.0 in

## 2021-12-08 DIAGNOSIS — I10 Essential (primary) hypertension: Secondary | ICD-10-CM

## 2021-12-08 DIAGNOSIS — E785 Hyperlipidemia, unspecified: Secondary | ICD-10-CM | POA: Diagnosis not present

## 2021-12-08 DIAGNOSIS — I130 Hypertensive heart and chronic kidney disease with heart failure and stage 1 through stage 4 chronic kidney disease, or unspecified chronic kidney disease: Secondary | ICD-10-CM | POA: Diagnosis not present

## 2021-12-08 DIAGNOSIS — N1832 Chronic kidney disease, stage 3b: Secondary | ICD-10-CM

## 2021-12-08 DIAGNOSIS — I5042 Chronic combined systolic (congestive) and diastolic (congestive) heart failure: Secondary | ICD-10-CM

## 2021-12-08 DIAGNOSIS — Z8673 Personal history of transient ischemic attack (TIA), and cerebral infarction without residual deficits: Secondary | ICD-10-CM | POA: Diagnosis not present

## 2021-12-08 MED ORDER — METOPROLOL SUCCINATE ER 50 MG PO TB24: 50.0000 mg | ORAL_TABLET | Freq: Every day | ORAL | 5 refills | Status: AC

## 2021-12-08 MED ORDER — FUROSEMIDE 40 MG PO TABS: 40.0000 mg | ORAL_TABLET | Freq: Every day | ORAL | 5 refills | Status: AC

## 2021-12-08 MED ORDER — SPIRONOLACTONE 25 MG PO TABS: 12.5000 mg | ORAL_TABLET | Freq: Every day | ORAL | 5 refills | Status: AC

## 2021-12-08 NOTE — Progress Notes (Signed)
Virtual Visit via Video Note   Because of Brandy [name]'s co-morbid illnesses, she is at least at moderate risk for complications without adequate follow up.  This format is felt to be most appropriate for this patient at this time.  All issues noted in this document were discussed and addressed.  A limited physical exam was performed with this format.  Please refer to the patient's chart for her consent to telehealth for [facility].       Date:  12/08/2021   ID:  Brandy [name], DOB 05-14-1940, MRN [id number] The patient was identified using 2 identifiers.  Patient Location: Home Provider Location: Office/Clinic   PCP:  [name], [place] Prov[facility] Cardiologist:  Skeet Latch, MD     Evaluation Performed:  Follow-Up Visit  Chief Complaint:  Hospital follow up  History of Present Illness:    Brandy [name] is a 81 y.o. female with chronic systolic and diastolic heart failure, HTN, HLD, nonbostructive CAD, PAF, CVA with L hemiparesis, COPD on home O2.   Prior patient of Dr. Bronson Ing.  Prior echo 03/2018 EF 20-25%.  Hospitalized 09/2018 hypernatremia, nonambulatory and nonverbal. Palliative consulted and found to have acute renal failure with creatinine 2.5, PEG tube placed.  Echo 10/2018 EF 35 to 40%, moderate LVH, grade 2 diastolic dysfunction.  Prior hospitalization with GI bleed in the setting of Eliquis complicated by community-acquired pneumonia on to have erosive gastritis and colonic polyps.  Instructed to hold Eliquis until 01/26/2020. ED 01/2020 heart failure exacerbation treated with IV Lasix.  Discharged on furosemide 20 mg daily.  Feeding tube fell out 07/2020 when she was eating more and not replaced.  Last seen 08/2021 by [name] accompanied by husband and at home nurse.  Noted abdominal pain.  EKG revealed sinus tachycardia.  BP well controlled in clinic.  Echo repeated as had been 3 years since her last.  Echo 09/23/21 EF  20-25%, no RWMA, severe LVH, gr3DD, regional strain pattern suggestive of cardiac amyloidosis, RVSF moderately reduced, normal PASP, LA severely dilated, small pericardial effusion with no cardiac tamponade, mild MR, aortic sclerosis without stenosis.   Admitted 8/28-11/28/21 after presenting with shortness of breath and edema. Diuresed with IV Lasix. Start on low dose Aldactone. SLGT2i deferred due to bed bound status.   Unable to tolerate laying flat for PYP scan.   Video visit today with caregiver who notes has been "taking it slow". Breathing seems a bit better than in hospital. Not much of an appetite nor drinking much. Eating simple foods like applesauce, jello. No lower extremity. Used to get out of recliner, but has not been getting out of bed as much. No bruising, no hematuria, melena. No orthopnea, PND. Noted fatigue and generalized deconditioning.   Past Medical History:  Diagnosis Date   Cardiomyopathy    a. EF of 25% in 07/2006 b. EF normalized by repeat echo in 2011 c. EF 35-40% by echo in 05/2017 with cath showing mild nonobstructive CAD   Chronic combined systolic and diastolic CHF (congestive heart failure) (HCC)    Chronic combined systolic and diastolic heart failure (HCC)    Colon polyps    Diverticulosis of colon    Hypertension    Hypertensive heart disease    Osteopenia    Paroxysmal atrial fibrillation ([place]) 10/10/2018   Stroke ([place])    TIA (transient ischemic attack)    Tobacco abuse    50 pack year   Past Surgical  History:  Procedure Laterality Date   BREAST BIOPSY     CARPAL TUNNEL RELEASE     right hand   CERVICAL FUSION     CESAREAN SECTION     2 times   COLONOSCOPY  8250   pt uncertain as to whether polypectomy required or performed   COLONOSCOPY WITH PROPOFOL N/A 01/23/2020   Procedure: COLONOSCOPY WITH PROPOFOL;  Surgeon: Rogene Houston, MD;  [facility];  Service: Endoscopy;  Laterality: N/A;   ECTOPIC PREGNANCY SURGERY      ESOPHAGOGASTRODUODENOSCOPY (EGD) WITH PROPOFOL N/A 10/13/2018   Procedure: ESOPHAGOGASTRODUODENOSCOPY (EGD) WITH PROPOFOL;  Surgeon: Aviva Signs, MD;  Location: [facility];  Service: General;  Laterality: N/A;   ESOPHAGOGASTRODUODENOSCOPY (EGD) WITH PROPOFOL N/A 01/23/2020   Procedure: ESOPHAGOGASTRODUODENOSCOPY (EGD) WITH PROPOFOL;  Surgeon: Rogene Houston, MD;  [facility];  Service: Endoscopy;  Laterality: N/A;   IR ANGIO VERTEBRAL SEL SUBCLAVIAN INNOMINATE UNI R MOD SED  02/10/2018   IR CT HEAD LTD  02/10/2018   IR PERCUTANEOUS ART THROMBECTOMY/INFUSION INTRACRANIAL INC DIAG ANGIO  02/10/2018   PEG PLACEMENT N/A 10/13/2018   Procedure: PERCUTANEOUS ENDOSCOPIC GASTROSTOMY (PEG) PLACEMENT;  Surgeon: Aviva Signs, MD;  Location: [facility];  Service: General;  Laterality: N/A;   PEG PLACEMENT N/A 12/04/2019   Procedure: PERCUTANEOUS ENDOSCOPIC GASTROSTOMY (PEG) REPLACEMENT;  Surgeon: Aviva Signs, MD;  [facility];  Service: Gastroenterology;  Laterality: N/A;   PEG PLACEMENT N/A 12/12/2019   Procedure: PERCUTANEOUS ENDOSCOPIC GASTROSTOMY (PEG) REPLACEMENT;  Surgeon: Aviva Signs, MD;  [facility];  Service: Gastroenterology;  Laterality: N/A;   POLYPECTOMY  01/23/2020   Procedure: POLYPECTOMY;  Surgeon: Rogene Houston, MD;  [facility];  Service: Endoscopy;;   RADIOLOGY WITH ANESTHESIA N/A 02/10/2018   Procedure: RADIOLOGY WITH ANESTHESIA;  Surgeon: Luanne Bras, MD;  Location: [place];  Service: Radiology;  Laterality: N/A;   RIGHT/LEFT HEART CATH AND CORONARY ANGIOGRAPHY N/A 06/03/2017   Procedure: RIGHT/LEFT HEART CATH AND CORONARY ANGIOGRAPHY;  Surgeon: Jettie Booze, MD;  Location: [place] [facility];  Service: Cardiovascular;  Laterality: N/A;   TEE WITHOUT CARDIOVERSION N/A 02/13/2018   Procedure: TRANSESOPHAGEAL ECHOCARDIOGRAM (TEE);  Surgeon: Sueanne Margarita, MD;  Location: [facility] ENDOSCOPY;  Service: Cardiovascular;  Laterality: N/A;    TONSILLECTOMY       Current Meds  Medication Sig   acetaminophen (TYLENOL) 325 MG tablet Take 2 tablets (650 mg total) by mouth every 4 (four) hours as needed for mild pain (or temp > 37.5 C (99.5 F)). (Patient taking differently: Take 650 mg by mouth every 6 (six) hours as needed for mild pain or moderate pain.)   ascorbic acid (VITAMIN C) 500 MG tablet Take 500 mg by mouth daily.   atorvastatin (LIPITOR) 10 MG tablet TAKE 1 TABLET(10 MG) BY MOUTH DAILY (Patient taking differently: Take 10 mg by mouth daily.)   augmented betamethasone dipropionate (DIPROLENE-AF) 0.05 % ointment Apply 1 Application topically daily as needed (For rash).   cetirizine (ZYRTEC) 10 MG tablet Take 10 mg by mouth daily as needed for allergies.   citalopram (CELEXA) 10 MG tablet Take 10 mg by mouth daily.   dicyclomine (BENTYL) 10 MG capsule Take 1 capsule (10 mg total) by mouth 2 (two) times daily as needed for up to 10 days for spasms (for abd pain, cramps).   ELIQUIS 2.5 MG TABS tablet Take 1 tablet (2.5 mg total) by mouth 2 (two) times daily.   pantoprazole (  PROTONIX) 40 MG tablet Take 1 tablet (40 mg total) by mouth daily before breakfast.   senna-docusate (SENOKOT-S) 8.6-50 MG tablet Take 1 tablet by mouth at bedtime.   [DISCONTINUED] furosemide (LASIX) 40 MG tablet Take 1 tablet (40 mg total) by mouth daily.   [DISCONTINUED] metoprolol succinate (TOPROL-XL) 50 MG 24 hr tablet Take 1 tablet (50 mg total) by mouth daily. Take with or immediately following a meal.   [DISCONTINUED] spironolactone (ALDACTONE) 25 MG tablet Take 0.5 tablets (12.5 mg total) by mouth daily.     Allergies:   Lisinopril and Jardiance [empagliflozin]   Social History   Tobacco Use   Smoking status: Former    Packs/day: 1.00    Years: 50.00    Total pack years: 50.00    Types: Cigarettes    Start date: 09/02/1960    Quit date: 03/29/2008    Years since quitting: 13.7   Smokeless tobacco: Never  Vaping Use   Vaping Use: Never used   Substance Use Topics   Alcohol use: No    Alcohol/week: 0.0 standard drinks of alcohol   Drug use: Never     Family Hx: The patient's family history includes Cancer in her paternal grandmother; Hypertension in her brother, father, maternal grandmother, and mother; Stroke in her maternal grandmother. There is no history of Colon cancer.  ROS:   Please see the history of present illness.     All other systems reviewed and are negative.   Prior CV studies:   The following studies were reviewed today: Echo 09/23/21   1. Left ventricular ejection fraction, by estimation, is 20 to 25%. The  left ventricle has severely decreased function. The left ventricle has no  regional wall motion abnormalities. There is severe concentric left  ventricular hypertrophy. Left  ventricular diastolic parameters are consistent with Grade III diastolic  dysfunction (restrictive). Global longitudinal strain pattern abnormal at  -3.7%. Regional strain pattern suggestive of cardiac amyloidosis (relative  apical sparing)   2. Right ventricular systolic function is moderately reduced. The right  ventricular size is normal. Moderately increased right ventricular wall  thickness. There is normal pulmonary artery systolic pressure.   3. Left atrial size was severely dilated.   4. A small pericardial effusion is present. The pericardial effusion is  circumferential. There is no evidence of cardiac tamponade.   5. The mitral valve is abnormal. Mild mitral valve regurgitation. No  evidence of mitral stenosis.   6. The aortic valve is tricuspid. Aortic valve regurgitation is not  visualized. Aortic valve sclerosis is present, with no evidence of aortic  valve stenosis.   7. The inferior vena cava is normal in size with greater than 50%  respiratory variability, suggesting right atrial pressure of 3 mmHg.   Comparison(s): Changes from prior study are noted. LVEF has worsened  Pericardial effusion is new.    Conclusion(s)/Recommendation(s): There is significant LV hypertrophy, RV  hypertrophy, left atrial enlargement, pericardial effusion and strain  cherry red spot. Consider CMR or PYP scan for assessment of cardiac  amyloidosis.   Echo Limited 11/06/2018:  1. The left ventricle has moderately reduced systolic function, with an  ejection fraction of 35-40%. The cavity size was normal. There is moderate  concentric left ventricular hypertrophy. Left ventricular diastolic  Doppler parameters are consistent with   pseudonormal. Left ventricular diffuse hypokinesis.   2. Left atrial size was mildly dilated.   3. The mitral valve is grossly normal.   4. The tricuspid valve was grossly  normal. Tricuspid valve regurgitation  was not assessed by color flow Doppler.   5. The aortic valve is tricuspid.   6. The aortic root is normal in size and structure.   7. The interatrial septum was not assessed.    Echo 03/30/2018: Study Conclusions  - Left ventricle: The cavity size was mildly dilated. Wall    thickness was increased in a pattern of moderate LVH. Systolic    function was severely reduced. The estimated ejection fraction    was in the range of 20% to 25%. Diffuse hypokinesis. Features are    consistent with a pseudonormal left ventricular filling pattern,    with concomitant abnormal relaxation and increased filling    pressure (grade 2 diastolic dysfunction). Doppler parameters are    consistent with high ventricular filling pressure.  - Mitral valve: Calcified annulus. Mildly thickened leaflets .    There was mild to moderate regurgitation.  - Left atrium: The atrium was mildly dilated.  - Pulmonary arteries: Systolic pressure was moderately increased.    PA peak pressure: 49 mm Hg (S).   Impressions:  - Severe global reduction in LV systolic function; moderate    diastolic dysfunction with elevated LV filling pressure; moderate    LVH; mild LVE; mild to moderate MR; mild LAE; mild  TR with    moderate pulmonary hypertension.    R/L Heart Cath 06/03/2017: Ost 2nd Diag to 2nd Diag lesion is 25% stenosed. Mid RCA lesion is 25% stenosed. LV end diastolic pressure is moderately elevated. There is no aortic valve stenosis. LV end diastolic pressure is normal. Hemodynamic findings consistent with mild pulmonary hypertension. CO 5.2 L/min; CI 2.86; Ao 94%; PA sat 65%, mean PA 29 mm Hg; PCWP 20 mm Hg   Mild, nonobstructive CAD.    Mildly elevated right heart pressures with volume overload.    Continue medical therapy.   EF not assessed to minimize contrast exposure.   Diagnostic Dominance: Right   Labs/Other Tests and Data Reviewed:    EKG:  No ECG reviewed.  Recent Labs: 11/24/2021: TSH 0.890 12/01/2021: ALT 20; B Natriuretic Peptide 3,703.0; BUN 52; Creatinine, Ser 1.98; Hemoglobin 11.3; Magnesium 1.8; Platelets 275; Potassium 4.3; Sodium 141   Recent Lipid Panel Lab Results  Component Value Date/Time   CHOL 93 (L) 05/21/2021 10:41 AM   TRIG 51 05/21/2021 10:41 AM   HDL 42 05/21/2021 10:41 AM   CHOLHDL 2.2 05/21/2021 10:41 AM   CHOLHDL 2.8 02/11/2018 04:47 AM   LDLCALC 38 05/21/2021 10:41 AM    Wt Readings from Last 3 Encounters:  12/01/21 130 lb 11.7 oz (59.3 kg)  11/28/21 130 lb 11.7 oz (59.3 kg)  03/05/21 126 lb (57.2 kg)     Risk Assessment/Calculations:    CHA2DS2-VASc Score = 7   This indicates a 11.2% annual risk of stroke. The patient's score is based upon: CHF History: 1 HTN History: 1 Diabetes History: 0 Stroke History: 2 Vascular Disease History: 0 Age Score: 2 Gender Score: 1         Objective:    Vital Signs:  BP (!) 132/94   Pulse 91   Ht '5\' 4"'$  (1.626 m)   SpO2 100% Comment: 3L  BMI 22.44 kg/m    VITAL SIGNS:  reviewed  ASSESSMENT & PLAN:    NICM / Combined systolic and diastolic heart failure - 10/6765 EF 20-25%. Per caretaker report LE edema and dyspnea improving. Overall bed bound due to hemiplegia and not  mobilizing as well  as prior to hospitalization. GDMT Spironolactone, Lasix, Metoprolol. No ACE/ARB/ARNI due to CKD. Echo with concern for amyloidosis. Patient and family do not feel she would tolerate PYP scan as deconditioned from recent admission, heart failure symptoms. Consider serum and urine protein electrophoresis with immunofixation. Will coordinate with daughter whether will require home health for collection, palliative can assist, or can travel for testing.   Hx of CVA / HLD - Residual longstanding hemiplegia. Predominantly bed bound. Continue Atorvastatin. No ASA due to [facility].  HTN -BP well controlled. Continue current antihypertensive regimen. Imdur held during recent admission to allow for BP room, no indication to resume.   PAF / Hypercoagulable state - No palpitations. Continue Toprol '50mg'$  QD. CHA2DS2-VASc Score = 7 [CHF History: 1, HTN History: 1, Diabetes History: 0, Stroke History: 2, Vascular Disease History: 0, Age Score: 2, Gender Score: 1].  Therefore, the patient's annual risk of stroke is 11.2 %.    Continue Eliquis 2.'5mg'$  BID. Reduced dose due to weight, renal function.   VOZ3G - Careful titration of diuretic and antihypertensive.  Will need BMP, BNP 1-2 weeks post discharge. Will coordinate with daughter whether palliative can assist, can travel for labs, or needs home health.   COPD / Chronic respiratory failure - Exertional dyspnea at approximate baseline. On home O2.      Time:   Today, I have spent 15 minutes with the patient with telehealth technology discussing the above problems.     Medication Adjustments/Labs and Tests Ordered: Current medicines are reviewed at length with the patient today.  Concerns regarding medicines are outlined above.   Tests Ordered: Orders Placed This Encounter  Procedures   Serum protein electrophoresis with reflex   Protein Electrophoresis, Urine Rflx.   Basic metabolic panel   Brain natriuretic peptide    Medication  Changes: Meds ordered this encounter  Medications   furosemide (LASIX) 40 MG tablet    Sig: Take 1 tablet (40 mg total) by mouth daily. May take additional tablet as needed up to twice per week for worsening edema, shortness of breath.    Dispense:  45 tablet    Refill:  5    NEW DOSE, D/C PREVIOUS RX    Order Specific Question:   Supervising Provider    Answer:   Buford Dresser [[id number]]   spironolactone (ALDACTONE) 25 MG tablet    Sig: Take 0.5 tablets (12.5 mg total) by mouth daily.    Dispense:  30 tablet    Refill:  5    Order Specific Question:   Supervising Provider    Answer:   Maris Berger   metoprolol succinate (TOPROL-XL) 50 MG 24 hr tablet    Sig: Take 1 tablet (50 mg total) by mouth daily. Take with or immediately following a meal.    Dispense:  30 tablet    Refill:  5    Order Specific Question:   Supervising Provider    Answer:   Buford Dresser [[id number]]    Follow Up:  Pending family decisions on next steps  Signed, Loel Dubonnet, NP  12/08/2021 9:53 PM    [place]

## 2021-12-08 NOTE — Patient Instructions (Addendum)
Medication Instructions:  Your physician has recommended you make the following change in your medication:   CONTINUE Spironolactone 12.'5mg'$  (half tablet) daily  CONTINUE Furosemide (Lasix) '40mg'$  (one tablet) daily May take additional tablet in the afternoon as needed for worsening swelling, weight gain, or worsening shortness of breath If requiring more than twice per week, please contact our office.  *If you need a refill on your cardiac medications before your next appointment, please call your pharmacy*   Lab Work: Recommend repeat BMP (monitoring of kidney function) 1-2 weeks after hospital discharge.  We can attempt to coordinate this with [place] or Palliative team may be able to facilitate if unable to travel for lab work. If you would like [place] to facilitate, please let us know.  Testing/Procedures: Your echocardiogram in June showed reduced heart pumping function with concern for changes consistent with amyloidosis.   Reasonable to not pursue PYP scan at this time to assess for amyloidosis as would requiring laying flat, lengthy test.  We could consider testing for amyloidosis with blood work and urine testing.  Amyloidosis is the buildup of a specific protein in the muscle of the heart that can lead to heart failure. Medical treatments are aimed for preventing progression, but current medical therapy is aimed at treating symptoms.   If interested in pursuing this additional testing (blood work and urine testing), please let us know and we can work to coordinate.   Follow-Up: At [facility], you and your health needs are our priority.  As part of our continuing mission to provide you with exceptional heart care, we have created designated Provider Care Teams.  These Care Teams include your primary Cardiologist (physician) and Advanced Practice Providers (APPs -  Physician Assistants and Nurse Practitioners) who all work together to provide you with the care  you need, when you need it.  We recommend signing up for the patient portal called "MyChart".  Sign up information is provided on this After Visit Summary.  MyChart is used to connect with patients for Virtual Visits (Telemedicine).  Patients are able to view lab/test results, encounter notes, upcoming appointments, etc.  Non-urgent messages can be sent to your provider as well.   To learn more about what you can do with MyChart, go to [email or web address].    Your next appointment:   TBD pending decisions on next steps  Other Instructions  You were referred to Palliative Care on hospital discharge. If you have not yet set up a visit with them, strongly recommend calling to schedule. Their phone number is [phone] and select option #3. Palliative care can help with resources at home, managing heart failure symptoms, and to partner with [place] to better treat symptoms while removing some of the burden of in-person visits.   To prevent or reduce lower extremity swelling: Eat a low salt diet. Salt makes the body hold onto extra fluid which causes swelling. Sit with legs elevated. For example, in the recliner or on an Sunflower.  Wear knee-high compression stockings during the daytime. Ones labeled 15-20 mmHg provide good compression.

## 2021-12-14 ENCOUNTER — Emergency Department (HOSPITAL_COMMUNITY): Payer: Medicare PPO

## 2021-12-14 ENCOUNTER — Other Ambulatory Visit: Payer: Self-pay

## 2021-12-14 ENCOUNTER — Inpatient Hospital Stay (HOSPITAL_COMMUNITY)
Admission: EM | Admit: 2021-12-14 | Discharge: 2021-12-27 | DRG: 291 | Disposition: E | Payer: Medicare PPO | Attending: Internal Medicine | Admitting: Internal Medicine

## 2021-12-14 DIAGNOSIS — I959 Hypotension, unspecified: Secondary | ICD-10-CM | POA: Diagnosis present

## 2021-12-14 DIAGNOSIS — F419 Anxiety disorder, unspecified: Secondary | ICD-10-CM | POA: Diagnosis present

## 2021-12-14 DIAGNOSIS — I4821 Permanent atrial fibrillation: Secondary | ICD-10-CM | POA: Diagnosis present

## 2021-12-14 DIAGNOSIS — R4182 Altered mental status, unspecified: Secondary | ICD-10-CM | POA: Diagnosis not present

## 2021-12-14 DIAGNOSIS — E875 Hyperkalemia: Secondary | ICD-10-CM | POA: Diagnosis present

## 2021-12-14 DIAGNOSIS — Z8673 Personal history of transient ischemic attack (TIA), and cerebral infarction without residual deficits: Secondary | ICD-10-CM | POA: Diagnosis not present

## 2021-12-14 DIAGNOSIS — G319 Degenerative disease of nervous system, unspecified: Secondary | ICD-10-CM | POA: Diagnosis not present

## 2021-12-14 DIAGNOSIS — R0902 Hypoxemia: Secondary | ICD-10-CM | POA: Diagnosis not present

## 2021-12-14 DIAGNOSIS — R29810 Facial weakness: Secondary | ICD-10-CM | POA: Diagnosis present

## 2021-12-14 DIAGNOSIS — N1832 Chronic kidney disease, stage 3b: Secondary | ICD-10-CM | POA: Diagnosis present

## 2021-12-14 DIAGNOSIS — I5043 Acute on chronic combined systolic (congestive) and diastolic (congestive) heart failure: Secondary | ICD-10-CM | POA: Diagnosis present

## 2021-12-14 DIAGNOSIS — N179 Acute kidney failure, unspecified: Secondary | ICD-10-CM | POA: Diagnosis present

## 2021-12-14 DIAGNOSIS — E785 Hyperlipidemia, unspecified: Secondary | ICD-10-CM | POA: Diagnosis present

## 2021-12-14 DIAGNOSIS — Z87891 Personal history of nicotine dependence: Secondary | ICD-10-CM | POA: Diagnosis not present

## 2021-12-14 DIAGNOSIS — Z20822 Contact with and (suspected) exposure to covid-19: Secondary | ICD-10-CM | POA: Diagnosis present

## 2021-12-14 DIAGNOSIS — I4891 Unspecified atrial fibrillation: Secondary | ICD-10-CM | POA: Diagnosis not present

## 2021-12-14 DIAGNOSIS — G8194 Hemiplegia, unspecified affecting left nondominant side: Secondary | ICD-10-CM | POA: Diagnosis not present

## 2021-12-14 DIAGNOSIS — I130 Hypertensive heart and chronic kidney disease with heart failure and stage 1 through stage 4 chronic kidney disease, or unspecified chronic kidney disease: Principal | ICD-10-CM | POA: Diagnosis present

## 2021-12-14 DIAGNOSIS — K5649 Other impaction of intestine: Secondary | ICD-10-CM | POA: Diagnosis present

## 2021-12-14 DIAGNOSIS — Z515 Encounter for palliative care: Secondary | ICD-10-CM | POA: Diagnosis not present

## 2021-12-14 DIAGNOSIS — I428 Other cardiomyopathies: Secondary | ICD-10-CM | POA: Diagnosis present

## 2021-12-14 DIAGNOSIS — R109 Unspecified abdominal pain: Secondary | ICD-10-CM | POA: Diagnosis not present

## 2021-12-14 DIAGNOSIS — E872 Acidosis, unspecified: Secondary | ICD-10-CM | POA: Diagnosis present

## 2021-12-14 DIAGNOSIS — E1122 Type 2 diabetes mellitus with diabetic chronic kidney disease: Secondary | ICD-10-CM | POA: Diagnosis present

## 2021-12-14 DIAGNOSIS — Z9981 Dependence on supplemental oxygen: Secondary | ICD-10-CM

## 2021-12-14 DIAGNOSIS — Z823 Family history of stroke: Secondary | ICD-10-CM

## 2021-12-14 DIAGNOSIS — R778 Other specified abnormalities of plasma proteins: Secondary | ICD-10-CM

## 2021-12-14 DIAGNOSIS — I5042 Chronic combined systolic (congestive) and diastolic (congestive) heart failure: Secondary | ICD-10-CM | POA: Diagnosis present

## 2021-12-14 DIAGNOSIS — Z7189 Other specified counseling: Secondary | ICD-10-CM | POA: Diagnosis not present

## 2021-12-14 DIAGNOSIS — I509 Heart failure, unspecified: Secondary | ICD-10-CM | POA: Diagnosis not present

## 2021-12-14 DIAGNOSIS — N183 Chronic kidney disease, stage 3 unspecified: Secondary | ICD-10-CM | POA: Diagnosis not present

## 2021-12-14 DIAGNOSIS — Z7901 Long term (current) use of anticoagulants: Secondary | ICD-10-CM

## 2021-12-14 DIAGNOSIS — R7989 Other specified abnormal findings of blood chemistry: Secondary | ICD-10-CM | POA: Diagnosis not present

## 2021-12-14 DIAGNOSIS — N170 Acute kidney failure with tubular necrosis: Secondary | ICD-10-CM | POA: Diagnosis not present

## 2021-12-14 DIAGNOSIS — I447 Left bundle-branch block, unspecified: Secondary | ICD-10-CM | POA: Diagnosis not present

## 2021-12-14 DIAGNOSIS — R627 Adult failure to thrive: Secondary | ICD-10-CM | POA: Diagnosis present

## 2021-12-14 DIAGNOSIS — I480 Paroxysmal atrial fibrillation: Secondary | ICD-10-CM | POA: Diagnosis not present

## 2021-12-14 DIAGNOSIS — Z993 Dependence on wheelchair: Secondary | ICD-10-CM

## 2021-12-14 DIAGNOSIS — I499 Cardiac arrhythmia, unspecified: Secondary | ICD-10-CM | POA: Diagnosis not present

## 2021-12-14 DIAGNOSIS — I69320 Aphasia following cerebral infarction: Secondary | ICD-10-CM

## 2021-12-14 DIAGNOSIS — R079 Chest pain, unspecified: Secondary | ICD-10-CM | POA: Diagnosis not present

## 2021-12-14 DIAGNOSIS — Z809 Family history of malignant neoplasm, unspecified: Secondary | ICD-10-CM

## 2021-12-14 DIAGNOSIS — J90 Pleural effusion, not elsewhere classified: Secondary | ICD-10-CM | POA: Diagnosis not present

## 2021-12-14 DIAGNOSIS — Z7401 Bed confinement status: Secondary | ICD-10-CM

## 2021-12-14 DIAGNOSIS — E860 Dehydration: Secondary | ICD-10-CM | POA: Diagnosis present

## 2021-12-14 DIAGNOSIS — I248 Other forms of acute ischemic heart disease: Secondary | ICD-10-CM | POA: Diagnosis present

## 2021-12-14 DIAGNOSIS — G9341 Metabolic encephalopathy: Secondary | ICD-10-CM | POA: Diagnosis present

## 2021-12-14 DIAGNOSIS — I6930 Unspecified sequelae of cerebral infarction: Secondary | ICD-10-CM

## 2021-12-14 DIAGNOSIS — K6289 Other specified diseases of anus and rectum: Secondary | ICD-10-CM | POA: Diagnosis present

## 2021-12-14 DIAGNOSIS — G9389 Other specified disorders of brain: Secondary | ICD-10-CM | POA: Diagnosis not present

## 2021-12-14 DIAGNOSIS — I69354 Hemiplegia and hemiparesis following cerebral infarction affecting left non-dominant side: Secondary | ICD-10-CM | POA: Diagnosis not present

## 2021-12-14 DIAGNOSIS — R404 Transient alteration of awareness: Secondary | ICD-10-CM | POA: Diagnosis not present

## 2021-12-14 DIAGNOSIS — Z8249 Family history of ischemic heart disease and other diseases of the circulatory system: Secondary | ICD-10-CM

## 2021-12-14 DIAGNOSIS — Z66 Do not resuscitate: Secondary | ICD-10-CM | POA: Diagnosis present

## 2021-12-14 DIAGNOSIS — I5023 Acute on chronic systolic (congestive) heart failure: Secondary | ICD-10-CM | POA: Diagnosis present

## 2021-12-14 DIAGNOSIS — I2510 Atherosclerotic heart disease of native coronary artery without angina pectoris: Secondary | ICD-10-CM | POA: Diagnosis present

## 2021-12-14 DIAGNOSIS — J9611 Chronic respiratory failure with hypoxia: Secondary | ICD-10-CM | POA: Diagnosis not present

## 2021-12-14 DIAGNOSIS — R0602 Shortness of breath: Secondary | ICD-10-CM | POA: Diagnosis not present

## 2021-12-14 DIAGNOSIS — N281 Cyst of kidney, acquired: Secondary | ICD-10-CM | POA: Diagnosis not present

## 2021-12-14 DIAGNOSIS — J449 Chronic obstructive pulmonary disease, unspecified: Secondary | ICD-10-CM | POA: Diagnosis present

## 2021-12-14 LAB — COMPREHENSIVE METABOLIC PANEL
ALT: 14 U/L (ref 0–44)
AST: 23 U/L (ref 15–41)
Albumin: 3.1 g/dL — ABNORMAL LOW (ref 3.5–5.0)
Alkaline Phosphatase: 57 U/L (ref 38–126)
Anion gap: 17 — ABNORMAL HIGH (ref 5–15)
BUN: 79 mg/dL — ABNORMAL HIGH (ref 8–23)
CO2: 18 mmol/L — ABNORMAL LOW (ref 22–32)
Calcium: 9.6 mg/dL (ref 8.9–10.3)
Chloride: 105 mmol/L (ref 98–111)
Creatinine, Ser: 3.55 mg/dL — ABNORMAL HIGH (ref 0.44–1.00)
GFR, Estimated: 13 mL/min — ABNORMAL LOW (ref >60)
Glucose, Bld: 98 mg/dL (ref 70–99)
Potassium: 4.5 mmol/L (ref 3.5–5.1)
Sodium: 140 mmol/L (ref 135–145)
Total Bilirubin: 1.3 mg/dL — ABNORMAL HIGH (ref 0.3–1.2)
Total Protein: 6.2 g/dL — ABNORMAL LOW (ref 6.5–8.1)

## 2021-12-14 LAB — CBC WITH DIFFERENTIAL/PLATELET
Abs Immature Granulocytes: 0.03 10*3/uL (ref 0.00–0.07)
Basophils Absolute: 0 10*3/uL (ref 0.0–0.1)
Basophils Relative: 0 %
Eosinophils Absolute: 0 10*3/uL (ref 0.0–0.5)
Eosinophils Relative: 0 %
HCT: 44.9 % (ref 36.0–46.0)
Hemoglobin: 14 g/dL (ref 12.0–15.0)
Immature Granulocytes: 0 %
Lymphocytes Relative: 15 %
Lymphs Abs: 1.4 10*3/uL (ref 0.7–4.0)
MCH: 29.2 pg (ref 26.0–34.0)
MCHC: 31.2 g/dL (ref 30.0–36.0)
MCV: 93.5 fL (ref 80.0–100.0)
Monocytes Absolute: 0.4 10*3/uL (ref 0.1–1.0)
Monocytes Relative: 5 %
Neutro Abs: 7.1 10*3/uL (ref 1.7–7.7)
Neutrophils Relative %: 80 %
Platelets: 243 10*3/uL (ref 150–400)
RBC: 4.8 MIL/uL (ref 3.87–5.11)
RDW: 16.6 % — ABNORMAL HIGH (ref 11.5–15.5)
WBC: 9 10*3/uL (ref 4.0–10.5)
nRBC: 0 % (ref 0.0–0.2)

## 2021-12-14 LAB — PROTIME-INR
INR: 3 — ABNORMAL HIGH (ref 0.8–1.2)
Prothrombin Time: 30.7 seconds — ABNORMAL HIGH (ref 11.4–15.2)

## 2021-12-14 LAB — TROPONIN I (HIGH SENSITIVITY)
Troponin I (High Sensitivity): 387 ng/L (ref <18)
Troponin I (High Sensitivity): 406 ng/L (ref <18)

## 2021-12-14 LAB — BRAIN NATRIURETIC PEPTIDE: B Natriuretic Peptide: 3355.5 pg/mL — ABNORMAL HIGH (ref 0.0–100.0)

## 2021-12-14 LAB — SARS CORONAVIRUS 2 BY RT PCR: SARS Coronavirus 2 by RT PCR: NEGATIVE

## 2021-12-14 LAB — MAGNESIUM: Magnesium: 2.1 mg/dL (ref 1.7–2.4)

## 2021-12-14 LAB — LACTIC ACID, PLASMA
Lactic Acid, Venous: 2.3 mmol/L (ref 0.5–1.9)
Lactic Acid, Venous: 2.8 mmol/L (ref 0.5–1.9)

## 2021-12-14 LAB — AMMONIA: Ammonia: 24 umol/L (ref 9–35)

## 2021-12-14 MED ORDER — SODIUM CHLORIDE 0.9 % IV BOLUS
250.0000 mL | Freq: Once | INTRAVENOUS | Status: AC
  Administered 2021-12-14: 250 mL via INTRAVENOUS

## 2021-12-14 NOTE — H&P (Signed)
History and Physical    Brandy [name] XTG:[id number] DOB: 05-Apr-1940 DOA: 12/01/2021  PCP: [name], MD  Patient coming from: Home  Chief Complaint: Hypotension  HPI: Brandy [name] is a 81 y.o. female with medical history significant of chronic combined CHF/nonischemic cardiomyopathy, paroxysmal A-fib on Eliquis, COPD on home oxygen, CVA with left hemiplegia, bed and wheelchair bound, severe protein calories malnutrition, CKD stage IIIb, hypertension.  Recent outpatient echo done 09/23/2021 noted EF 20 to 25%, severe LVH, grade 3 diastolic dysfunction, small pericardial effusion, moderately reduced RV function, and myocardial pattern concerning for cardiac amyloidosis.  Cath in 2019 noted mild nonobstructive CAD. Recently admitted 8/28-9/2 for decompensated CHF and diuresed with IV Lasix.  She was discharged on oral Lasix and was started on low-dose Aldactone.  Dose of Imdur was decreased.  Poor candidate for SGLT2 inhibitor with bedbound status.  Cardiology recommended outpatient follow-up for PYP scan.  Given worsening cardiomyopathy and very poor functional status, CODE STATUS was discussed with the patient's family and they decided to keep her full code.  Palliative care follow-up was arranged at discharge.  Patient subsequently had a follow-up visit with cardiology on 9/12 and it was felt that she could not tolerate PYP scan due to heart failure symptoms and physical deconditioning.  Today EMS was called due to patient having abdominal pain.  Patient is on home oxygen and caregiver was concerned that her tank had run out of oxygen as she was going in and out of consciousness.  Her eyes were open but not tracking.  EMS unable to obtain blood pressure and radial pulses initially.  In route they were able to feel pulses and her blood pressure was 50/30.  Upon arrival to the ED, SBP 80-90 and noted to be in A-fib with RVR with rate in the 120s.  GCS 11.  She had an upward and rightward gaze  which was redirectable on command.  POCUS showed EF less than 20%.  Extremities were cool to touch.  Patient was given a 250 cc IV bolus after which her blood pressure improved with systolic 270-350 and diastolic 09-38.  Afebrile.  Labs showing no leukocytosis, hemoglobin 14.0 (improved compared to prior labs), bicarb 18, anion gap 17, glucose 98, BUN 79, creatinine 3.5 (baseline 1.4-1.6), SARS-CoV-2 PCR negative, UA pending, magnesium 2.1, high sensitive troponin 387> 406 (elevated to 200-300s on previous labs as well), BNP 3355 (was 3703 on labs done 13 days ago), lactic acid 2.3> 2.8, INR 3.0, ammonia normal, blood cultures drawn.    Chest x-ray showing cardiomegaly and similar layering bilateral pleural effusion with overlying atelectasis and/or consolidation.    CT head showing changes consistent with old right MCA infarct and no acute abnormality.    CT abdomen pelvis showing rectal impaction with wall thickening and perirectal inflammatory changes consistent with stercoral proctitis.  Bilateral moderate pleural effusions with associated atelectatic changes.  Hepatic and renal cysts; no follow-up recommended.  Patient is not able to give any history due to confusion and dysarthria.  Husband states he took her to PCPs appointment today she complained of shortness of breath.  He is not sure which medication she is taking at home as her daughter caregiver manage her medications.  Husband states patient is on 3 to 4 L home oxygen chronically.  Review of Systems:  Review of Systems  Reason unable to perform ROS: AMS.    Past Medical History:  Diagnosis Date   Cardiomyopathy    a. EF of 25%  in 07/2006 b. EF normalized by repeat echo in 2011 c. EF 35-40% by echo in 05/2017 with cath showing mild nonobstructive CAD   Chronic combined systolic and diastolic CHF (congestive heart failure) (HCC)    Chronic combined systolic and diastolic heart failure (HCC)    Colon polyps    Diverticulosis of colon     Hypertension    Hypertensive heart disease    Osteopenia    Paroxysmal atrial fibrillation ([place]) 10/10/2018   Stroke ([place])    TIA (transient ischemic attack)    Tobacco abuse    50 pack year    Past Surgical History:  Procedure Laterality Date   BREAST BIOPSY     CARPAL TUNNEL RELEASE     right hand   CERVICAL FUSION     CESAREAN SECTION     2 times   COLONOSCOPY  2979   pt uncertain as to whether polypectomy required or performed   COLONOSCOPY WITH PROPOFOL N/A 01/23/2020   Procedure: COLONOSCOPY WITH PROPOFOL;  Surgeon: Rogene Houston, MD;  [facility];  Service: Endoscopy;  Laterality: N/A;   ECTOPIC PREGNANCY SURGERY     ESOPHAGOGASTRODUODENOSCOPY (EGD) WITH PROPOFOL N/A 10/13/2018   Procedure: ESOPHAGOGASTRODUODENOSCOPY (EGD) WITH PROPOFOL;  Surgeon: Aviva Signs, MD;  Location: [facility];  Service: General;  Laterality: N/A;   ESOPHAGOGASTRODUODENOSCOPY (EGD) WITH PROPOFOL N/A 01/23/2020   Procedure: ESOPHAGOGASTRODUODENOSCOPY (EGD) WITH PROPOFOL;  Surgeon: Rogene Houston, MD;  [facility];  Service: Endoscopy;  Laterality: N/A;   IR ANGIO VERTEBRAL SEL SUBCLAVIAN INNOMINATE UNI R MOD SED  02/10/2018   IR CT HEAD LTD  02/10/2018   IR PERCUTANEOUS ART THROMBECTOMY/INFUSION INTRACRANIAL INC DIAG ANGIO  02/10/2018   PEG PLACEMENT N/A 10/13/2018   Procedure: PERCUTANEOUS ENDOSCOPIC GASTROSTOMY (PEG) PLACEMENT;  Surgeon: Aviva Signs, MD;  Location: [facility];  Service: General;  Laterality: N/A;   PEG PLACEMENT N/A 12/04/2019   Procedure: PERCUTANEOUS ENDOSCOPIC GASTROSTOMY (PEG) REPLACEMENT;  Surgeon: Aviva Signs, MD;  [facility];  Service: Gastroenterology;  Laterality: N/A;   PEG PLACEMENT N/A 12/12/2019   Procedure: PERCUTANEOUS ENDOSCOPIC GASTROSTOMY (PEG) REPLACEMENT;  Surgeon: Aviva Signs, MD;  [facility];  Service: Gastroenterology;  Laterality: N/A;   POLYPECTOMY  01/23/2020   Procedure: POLYPECTOMY;  Surgeon: Rogene Houston, MD;  [facility];  Service: Endoscopy;;   RADIOLOGY WITH ANESTHESIA N/A 02/10/2018   Procedure: RADIOLOGY WITH ANESTHESIA;  Surgeon: Luanne Bras, MD;  Location: [facility];  Service: Radiology;  Laterality: N/A;   RIGHT/LEFT HEART CATH AND CORONARY ANGIOGRAPHY N/A 06/03/2017   Procedure: RIGHT/LEFT HEART CATH AND CORONARY ANGIOGRAPHY;  Surgeon: Jettie Booze, MD;  Location: [facility];  Service: Cardiovascular;  Laterality: N/A;   TEE WITHOUT CARDIOVERSION N/A 02/13/2018   Procedure: TRANSESOPHAGEAL ECHOCARDIOGRAM (TEE);  Surgeon: Sueanne Margarita, MD;  Location[facility];  Service: Cardiovascular;  Laterality: N/A;   TONSILLECTOMY       reports that she quit smoking about 13 years ago. Her smoking use included cigarettes. She started smoking about 61 years ago. She has a 50.00 pack-year smoking history. She has never used smokeless tobacco. She reports that she does not drink alcohol and does not use drugs.  Allergies  Allergen Reactions   Lisinopril Swelling   Jardiance [Empagliflozin] Rash    Family History  Problem Relation Age of Onset   Hypertension Mother    Hypertension Father    Hypertension Brother    Hypertension  Maternal Grandmother    Stroke Maternal Grandmother    Cancer Paternal Grandmother    Colon cancer Neg Hx     Prior to Admission medications   Medication Sig Start Date End Date Taking? Authorizing Provider  atorvastatin (LIPITOR) 10 MG tablet TAKE 1 TABLET(10 MG) BY MOUTH DAILY Patient taking differently: Take 10 mg by mouth daily. 11/26/21  Yes Skeet Latch, MD  furosemide (LASIX) 40 MG tablet Take 1 tablet (40 mg total) by mouth daily. May take additional tablet as needed up to twice per week for worsening edema, shortness of breath. 12/08/21  Yes Loel Dubonnet, NP  metoprolol succinate (TOPROL-XL) 50 MG 24 hr tablet Take 1 tablet (50 mg total) by mouth daily. Take with or immediately following a meal. 12/08/21   Yes Loel Dubonnet, NP  acetaminophen (TYLENOL) 325 MG tablet Take 2 tablets (650 mg total) by mouth every 4 (four) hours as needed for mild pain (or temp > 37.5 C (99.5 F)). Patient taking differently: Take 650 mg by mouth every 6 (six) hours as needed for mild pain or moderate pain. 03/16/18   Angiulli, Lavon Paganini, PA-C  ascorbic acid (VITAMIN C) 500 MG tablet Take 500 mg by mouth daily.    [provider]  augmented betamethasone dipropionate (DIPROLENE-AF) 0.05 % ointment Apply 1 Application topically daily as needed (For rash). 11/16/21   [provider]  cetirizine (ZYRTEC) 10 MG tablet Take 10 mg by mouth daily as needed for allergies.    [provider]  citalopram (CELEXA) 10 MG tablet Take 10 mg by mouth daily.    [provider]  dicyclomine (BENTYL) 10 MG capsule Take 1 capsule (10 mg total) by mouth 2 (two) times daily as needed for up to 10 days for spasms (for abd pain, cramps). 11/28/21 12/08/21  Domenic Polite, MD  ELIQUIS 2.5 MG TABS tablet Take 1 tablet (2.5 mg total) by mouth 2 (two) times daily. 01/26/20   Johnson, Clanford L, MD  pantoprazole (PROTONIX) 40 MG tablet Take 1 tablet (40 mg total) by mouth daily before breakfast. 01/25/20   Johnson, Clanford L, MD  senna-docusate (SENOKOT-S) 8.6-50 MG tablet Take 1 tablet by mouth at bedtime. 12/01/21   Isla Pence, MD  spironolactone (ALDACTONE) 25 MG tablet Take 0.5 tablets (12.5 mg total) by mouth daily. 12/08/21   Loel Dubonnet, NP    Physical Exam: Vitals:   12/06/2021 2030 12/13/2021 2045 12/13/2021 2145 12/23/2021 2230  BP: 120/71 101/80 101/80 116/84  Pulse: (!) 112   (!) 109  Resp: (!) 24 (!) '24 13 20  '$ Temp:      TempSrc:      SpO2: 100%   100%    Physical Exam Vitals reviewed.  Constitutional:      General: She is not in acute distress. HENT:     Head: Normocephalic and atraumatic.  Eyes:     Extraocular Movements: Extraocular movements intact.  Cardiovascular:     Rate and  Rhythm: Tachycardia present. Rhythm irregular.     Pulses: Normal pulses.  Pulmonary:     Effort: Pulmonary effort is normal. No respiratory distress.     Breath sounds: No wheezing.  Abdominal:     General: Bowel sounds are normal. There is no distension.     Palpations: Abdomen is soft.     Tenderness: There is no abdominal tenderness.  Musculoskeletal:        General: No swelling or tenderness.  Skin:    General:  Skin is warm and dry.  Neurological:     Mental Status: She is alert.     Comments: Dysarthria Right gaze preference although redirectable Right facial droop Left hemiparesis Strength 4 out of 5 in the right upper extremity and 3 out of 5 in the right lower extremity      Labs on Admission: I have personally reviewed following labs and imaging studies  CBC: Recent Labs  Lab 12/05/2021 1610  WBC 9.0  NEUTROABS 7.1  HGB 14.0  HCT 44.9  MCV 93.5  PLT 161   Basic Metabolic Panel: Recent Labs  Lab 12/06/2021 1610  NA 140  K 4.5  CL 105  CO2 18*  GLUCOSE 98  BUN 79*  CREATININE 3.55*  CALCIUM 9.6  MG 2.1   GFR: Estimated Creatinine Clearance: 11.1 mL/min (A) (by C-G formula based on SCr of 3.55 mg/dL (H)). Liver Function Tests: Recent Labs  Lab 12/12/2021 1610  AST 23  ALT 14  ALKPHOS 57  BILITOT 1.3*  PROT 6.2*  ALBUMIN 3.1*   No results for input(s): "LIPASE", "AMYLASE" in the last 168 hours. Recent Labs  Lab 12/22/2021 1610  AMMONIA 24   Coagulation Profile: Recent Labs  Lab 11/30/2021 1610  INR 3.0*   Cardiac Enzymes: No results for input(s): "CKTOTAL", "CKMB", "CKMBINDEX", "TROPONINI" in the last 168 hours. BNP (last 3 results) No results for input(s): "PROBNP" in the last 8760 hours. HbA1C: No results for input(s): "HGBA1C" in the last 72 hours. CBG: No results for input(s): "GLUCAP" in the last 168 hours. Lipid Profile: No results for input(s): "CHOL", "HDL", "LDLCALC", "TRIG", "CHOLHDL", "LDLDIRECT" in the last 72 hours. Thyroid  Function Tests: No results for input(s): "TSH", "T4TOTAL", "FREET4", "T3FREE", "THYROIDAB" in the last 72 hours. Anemia Panel: No results for input(s): "VITAMINB12", "FOLATE", "FERRITIN", "TIBC", "IRON", "RETICCTPCT" in the last 72 hours. Urine analysis:    Component Value Date/Time   COLORURINE YELLOW 12/01/2021 0728   APPEARANCEUR CLEAR 12/01/2021 0728   LABSPEC 1.013 12/01/2021 0728   PHURINE 5.0 12/01/2021 0728   GLUCOSEU NEGATIVE 12/01/2021 0728   HGBUR NEGATIVE 12/01/2021 0728   BILIRUBINUR NEGATIVE 12/01/2021 0728   KETONESUR NEGATIVE 12/01/2021 0728   PROTEINUR 30 (A) 12/01/2021 0728   NITRITE NEGATIVE 12/01/2021 0728   LEUKOCYTESUR NEGATIVE 12/01/2021 0728    Radiological Exams on Admission: CT ABDOMEN PELVIS WO CONTRAST  Result Date: 12/25/2021 CLINICAL DATA:  Acute abdominal pain EXAM: CT ABDOMEN AND PELVIS WITHOUT CONTRAST TECHNIQUE: Multidetector CT imaging of the abdomen and pelvis was performed following the standard protocol without IV contrast. RADIATION DOSE REDUCTION: This exam was performed according to the departmental dose-optimization program which includes automated exposure control, adjustment of the mA and/or kV according to patient size and/or use of iterative reconstruction technique. COMPARISON:  11/26/2021 FINDINGS: Lower chest: Bilateral moderate pleural effusions right slightly greater than left. Some associated atelectatic changes are noted. Hepatobiliary: Gallbladder is within normal limits. Liver again demonstrates right hepatic cyst stable from the prior study. Pancreas: Unremarkable. No pancreatic ductal dilatation or surrounding inflammatory changes. Spleen: Normal in size without focal abnormality. Adrenals/Urinary Tract: Adrenal glands are within normal limits. Left renal cyst is noted and stable. No further follow-up is recommended. No obstructive changes are seen. The bladder is decompressed. Stomach/Bowel: Findings of rectal impaction noted with wall  thickening and Peri rectal inflammatory changes consistent with stercoral proctitis. Remainder of the colon appears within normal limits. Appendix is unremarkable. Small bowel and stomach are within normal limits. Vascular/Lymphatic: Aortic atherosclerosis.  No enlarged abdominal or pelvic lymph nodes. Reproductive: Uterus and bilateral adnexa are unremarkable. Other: No abdominal wall hernia or abnormality. No abdominopelvic ascites. Musculoskeletal: Degenerative changes of lumbar spine are noted. No compression deformity is noted. IMPRESSION: Findings of rectal impaction with wall thickening and. Rectal inflammatory change consistent with proctitis. This has increased somewhat in the interval from the prior exam. Bilateral moderate pleural effusions with associated atelectatic changes. Hepatic and renal cysts.  No follow-up is recommended. Electronically Signed   By: Inez Catalina M.D.   On: 12/12/2021 21:54   CT Head Wo Contrast  Result Date: 12/05/2021 CLINICAL DATA:  Altered mental status EXAM: CT HEAD WITHOUT CONTRAST TECHNIQUE: Contiguous axial images were obtained from the base of the skull through the vertex without intravenous contrast. RADIATION DOSE REDUCTION: This exam was performed according to the departmental dose-optimization program which includes automated exposure control, adjustment of the mA and/or kV according to patient size and/or use of iterative reconstruction technique. COMPARISON:  02/10/18 FINDINGS: Brain: There are changes consistent with prior right MCA infarct new from the prior CT examination. Chronic atrophic changes are noted. No acute infarct is seen. No acute hemorrhage or space-occupying mass lesion is noted. Vascular: No hyperdense vessel or unexpected calcification. Skull: Normal. Negative for fracture or focal lesion. Sinuses/Orbits: Mucosal retention cyst is noted in the left maxillary antrum. Other: None. IMPRESSION: Changes consistent with old right MCA infarct  consistent with previously seen right MCA thrombosis. Atrophic changes without acute abnormality. Electronically Signed   By: Inez Catalina M.D.   On: 12/19/2021 21:51   DG Chest Portable 1 View  Result Date: 12/26/2021 CLINICAL DATA:  sob EXAM: PORTABLE CHEST 1 VIEW COMPARISON:  12/01/2021. FINDINGS: Similar large of the cardiac silhouette. Similar layering bilateral pleural effusions with overlying bibasilar opacities. No visible pneumothorax. Scoliosis and polyarticular degenerative change. ACDF. IMPRESSION: 1. Similar layering bilateral pleural effusion with overlying atelectasis and/or consolidation. 2. Similar cardiomegaly. Electronically Signed   By: Margaretha Sheffield M.D.   On: 12/25/2021 16:41    EKG: Independently reviewed.  A-fib with RVR.  ST changes in lateral leads seen on prior tracing as well.  Assessment and Plan  Acute on chronic combined CHF Patient is presenting with volume overload, hypotension, and lactic acidosis concerning for worsening heart failure/possible cardiogenic shock although blood pressure improved after 250 cc IV fluid bolus. BNP 3355 (was 3703 on labs done 13 days ago), lactic acid 2.3> 2.8 >2.1.  Imaging showing bilateral moderate pleural effusions.  Recent outpatient echo done 09/23/2021 noted EF 20 to 25%, severe LVH, grade 3 diastolic dysfunction, small pericardial effusion, moderately reduced RV function, and myocardial pattern concerning for cardiac amyloidosis.  Recently seen by outpatient cardiology and it was felt that she could not tolerate PYP scan due to heart failure symptoms and physical deconditioning. POCUS done in the ED showed EF less than 20%.   I discussed the case with Dr. Radford Pax who felt that there is not much that can be offered from cardiology standpoint given patient's worsening heart failure, acute renal failure, and very poor functional status.  Cardiology feels she is not a candidate for any further cardiac interventions.  Recommending getting  palliative care involved for goals of care discussions. I had a lengthy discussion with the patient's husband regarding her poor prognosis.  Discussed CODE STATUS, he wants her to remain full code until further discussions with palliative care and other family members. -Discussed with cardiology, unable to give Lasix and her home medications at  this time given hypotension. -Palliative care consulted  AKI on CKD stage IIIb Metabolic acidosis Mild hyperkalemia Likely cardiorenal in the setting of decompensated CHF.  Creatinine 3.5 >3.7, baseline 1.4-1.6.  Bicarb 20 and potassium 5.3 on repeat labs. -See discussion above -Palliative care consulted -Avoid nephrotoxic agents  Paroxysmal A-fib with RVR on Eliquis Rate currently 100-110. -Avoiding beta-blocker due to hypotension -Hold Eliquis/unable to tolerate p.o. meds at this time -Cardiology recommending palliative care consult.  Elevated troponin Likely due to demand ischemia in setting of decompensated CHF.  High sensitive troponin 387> 406 (elevated to 200-300s on previous labs as well).  Cath in 2019 noted mild nonobstructive CAD. -Cardiology recommending palliative care consult.  History of CVA with left hemiplegia Bed and wheelchair bound Altered mental status Her exam was concerning for recurrent stroke.  Discussed the case with neurology and brain MRI ordered which came back showing large remote right MCA distribution infarct with additional small left cerebellar infarct, underlying age-related cerebral atrophy with moderate chronic microvascular ischemic disease, and no acute intracranial abnormality. -Palliative care consulted.  Stercoral proctitis CT abdomen pelvis showing rectal impaction with wall thickening and perirectal inflammatory changes consistent with stercoral proctitis.   -Manual fecal disimpaction and tap water enema.  Patient unable to take p.o. meds at this time.  COPD Chronic hypoxemic respiratory failure on 3  to 4 L home oxygen No signs of COPD exacerbation.  No change in oxygen requirement from baseline. -Continue supplemental oxygen  Code Status: Full Code (discussed with the patient's husband) Family Communication: Husband at bedside. Consults called: Cardiology, palliative care Level of care: Progressive Care Unit Admission status: It is my clinical opinion that referral for OBSERVATION is reasonable and necessary in this patient based on the above information provided. The aforementioned taken together are felt to place the patient at high risk for further clinical deterioration. However, it is anticipated that the patient may be medically stable for discharge from the hospital within 24 to 48 hours.   Shela Leff MD [facility]  If 7PM-7AM, please contact night-coverage [email or web address]  12/16/2021, 10:59 PM

## 2021-12-14 NOTE — ED Triage Notes (Signed)
Pt bib [facility] from PCP for follow-up from abd pain. Pt on home O2., caregiver believes pt tank ranout. Pt was in & Out of consciousness. Pt eyes were open, but not tracking. EMS unable to obtain BP and radial pulses originally. Enroute EMS able to feel pulses and BP of 50/30. Pt talks at baseline.   EMS vitals 50/30 BP 70 palp BP 110 CBG 80-120 HR

## 2021-12-14 NOTE — ED Provider Notes (Signed)
[facility] EMERGENCY DEPARTMENT Provider Note   CSN: [id number] Arrival date & time: 12/13/2021  1527     History {Add pertinent medical, surgical, social history, OB history to HPI:1} Chief Complaint  Patient presents with   Hypotension    Brandy [name] is a 81 y.o. female.  HPI     Home Medications Prior to Admission medications   Medication Sig Start Date End Date Taking? Authorizing Provider  acetaminophen (TYLENOL) 325 MG tablet Take 2 tablets (650 mg total) by mouth every 4 (four) hours as needed for mild pain (or temp > 37.5 C (99.5 F)). Patient taking differently: Take 650 mg by mouth every 6 (six) hours as needed for mild pain or moderate pain. 03/16/18   Angiulli, Lavon Paganini, PA-C  ascorbic acid (VITAMIN C) 500 MG tablet Take 500 mg by mouth daily.    [provider]  atorvastatin (LIPITOR) 10 MG tablet TAKE 1 TABLET(10 MG) BY MOUTH DAILY Patient taking differently: Take 10 mg by mouth daily. 11/26/21   Skeet Latch, MD  augmented betamethasone dipropionate (DIPROLENE-AF) 0.05 % ointment Apply 1 Application topically daily as needed (For rash). 11/16/21   [provider]  cetirizine (ZYRTEC) 10 MG tablet Take 10 mg by mouth daily as needed for allergies.    [provider]  citalopram (CELEXA) 10 MG tablet Take 10 mg by mouth daily.    [provider]  dicyclomine (BENTYL) 10 MG capsule Take 1 capsule (10 mg total) by mouth 2 (two) times daily as needed for up to 10 days for spasms (for abd pain, cramps). 11/28/21 12/08/21  Domenic Polite, MD  ELIQUIS 2.5 MG TABS tablet Take 1 tablet (2.5 mg total) by mouth 2 (two) times daily. 01/26/20   Johnson, Clanford L, MD  furosemide (LASIX) 40 MG tablet Take 1 tablet (40 mg total) by mouth daily. May take additional tablet as needed up to twice per week for worsening edema, shortness of breath. 12/08/21   Loel Dubonnet, NP  metoprolol succinate (TOPROL-XL) 50 MG 24 hr  tablet Take 1 tablet (50 mg total) by mouth daily. Take with or immediately following a meal. 12/08/21   Loel Dubonnet, NP  pantoprazole (PROTONIX) 40 MG tablet Take 1 tablet (40 mg total) by mouth daily before breakfast. 01/25/20   Johnson, Clanford L, MD  senna-docusate (SENOKOT-S) 8.6-50 MG tablet Take 1 tablet by mouth at bedtime. 12/01/21   Isla Pence, MD  spironolactone (ALDACTONE) 25 MG tablet Take 0.5 tablets (12.5 mg total) by mouth daily. 12/08/21   Loel Dubonnet, NP      Allergies    Lisinopril and Jardiance [empagliflozin]    Review of Systems   Review of Systems  Physical Exam Updated Vital Signs There were no vitals taken for this visit. Physical Exam  ED Results / Procedures / Treatments   Labs (all labs ordered are listed, but only abnormal results are displayed) Labs Reviewed - No data to display  EKG None  Radiology No results found.  Procedures Ultrasound ED Echo  Date/Time: 12/07/2021 7:47 PM  Performed by: Rosine Abe, MD Authorized by: Elnora Morrison, MD   Procedure details:    Indications: hypotension     Views: subxiphoid, parasternal long axis view, parasternal short axis view and IVC view     Images: archived   Findings:    Pericardium: no pericardial effusion     LV Function: severly depressed (<30%)     IVC: collapsed  Impression:    Impression: decreased contractility     {Document cardiac monitor, telemetry assessment procedure when appropriate:1}  Medications Ordered in ED Medications - No data to display  ED Course/ Medical Decision Making/ A&P                           Medical Decision Making Amount and/or Complexity of Data Reviewed Labs: ordered. Radiology: ordered.  Risk Decision regarding hospitalization.   *** 81 year old female with past medical history of CHF with EF less than 20%, history of CVA with left hemiplegia, bedbound status, COPD, on 2 L nasal cannula at home, CKD, hypertension, paroxysmal  A-fib presenting from follow-up doctor's appointment today intermittent worsening mental status and hypotension.  Afebrile.  Per EMS caretaker believes that patient's O2 tank ran out during transportation to doctor's office today.  Differential diagnosis includes heart failure exacerbation, AKI, electrolyte abnormalities, COPD exacerbation, pneumonia, UTI, COVID.  On arrival patient has SBP 80-90.  POCUS showed EF less than 20%, with collapsible IVC.  Extremities are cool.  Concerning for cardiogenic shock and hypovolemia.  Patient given small 250 cc IV fluid bolus.  Blood pressure has maintained SBP 90s to low 100s. Heart rate ranging from 90-110s.  EKG shows atrial fibrillation, which is consistent with patient's history. GCS 11, as patient is not verbally communicating, however she is nodding her head yes and no to questions and she is opening her eyes spontaneously.  Does have an upward and rightward gaze, however this is redirectable on command. Per family members at that side they report that this has been her consistent mental status over the past several days, and while recently in the hospital for heart failure exacerbation. Additionally, patient was seen in the ED recently for abdominal pain, however family reports that this is a frequent complaint of hers over the past several weeks, and is intermittent.  Chest x-ray with similar baseline cardiomegaly with mild pulmonary edema. Labs consistent with exacerbation of her chronic conditions.  BNP 3300, which is similar to patient's prior BNP on presentation of recent heart failure exacerbation.  Lactic acid elevated 2.3.  Troponin 387, similar to her baseline. Additionally patient has AKI today with creatinine 3.55, increased from most recent of 1.9.  I discussed with the family at bedside, at this time patient does remain a full code. Patient admitted to ***service.  {Document critical care time when appropriate:1} {Document review of labs  and clinical decision tools ie heart score, Chads2Vasc2 etc:1}  {Document your independent review of radiology images, and any outside records:1} {Document your discussion with family members, caretakers, and with consultants:1} {Document social determinants of health affecting pt's care:1} {Document your decision making why or why not admission, treatments were needed:1} Final Clinical Impression(s) / ED Diagnoses Final diagnoses:  None    Rx / DC Orders ED Discharge Orders     None

## 2021-12-14 NOTE — ED Notes (Addendum)
Pt on 4L [place] at baseline.

## 2021-12-15 ENCOUNTER — Observation Stay (HOSPITAL_COMMUNITY): Payer: Medicare PPO

## 2021-12-15 DIAGNOSIS — G8194 Hemiplegia, unspecified affecting left nondominant side: Secondary | ICD-10-CM | POA: Diagnosis not present

## 2021-12-15 DIAGNOSIS — Z87891 Personal history of nicotine dependence: Secondary | ICD-10-CM | POA: Diagnosis not present

## 2021-12-15 DIAGNOSIS — I5043 Acute on chronic combined systolic (congestive) and diastolic (congestive) heart failure: Secondary | ICD-10-CM | POA: Diagnosis present

## 2021-12-15 DIAGNOSIS — Z66 Do not resuscitate: Secondary | ICD-10-CM | POA: Diagnosis present

## 2021-12-15 DIAGNOSIS — Z515 Encounter for palliative care: Secondary | ICD-10-CM | POA: Diagnosis not present

## 2021-12-15 DIAGNOSIS — I4821 Permanent atrial fibrillation: Secondary | ICD-10-CM | POA: Diagnosis present

## 2021-12-15 DIAGNOSIS — I428 Other cardiomyopathies: Secondary | ICD-10-CM | POA: Diagnosis present

## 2021-12-15 DIAGNOSIS — I6930 Unspecified sequelae of cerebral infarction: Secondary | ICD-10-CM

## 2021-12-15 DIAGNOSIS — G319 Degenerative disease of nervous system, unspecified: Secondary | ICD-10-CM | POA: Diagnosis not present

## 2021-12-15 DIAGNOSIS — I4891 Unspecified atrial fibrillation: Secondary | ICD-10-CM | POA: Diagnosis present

## 2021-12-15 DIAGNOSIS — I5023 Acute on chronic systolic (congestive) heart failure: Secondary | ICD-10-CM | POA: Diagnosis not present

## 2021-12-15 DIAGNOSIS — I248 Other forms of acute ischemic heart disease: Secondary | ICD-10-CM | POA: Diagnosis present

## 2021-12-15 DIAGNOSIS — N183 Chronic kidney disease, stage 3 unspecified: Secondary | ICD-10-CM

## 2021-12-15 DIAGNOSIS — J9611 Chronic respiratory failure with hypoxia: Secondary | ICD-10-CM | POA: Diagnosis present

## 2021-12-15 DIAGNOSIS — I480 Paroxysmal atrial fibrillation: Secondary | ICD-10-CM | POA: Diagnosis not present

## 2021-12-15 DIAGNOSIS — I5042 Chronic combined systolic (congestive) and diastolic (congestive) heart failure: Secondary | ICD-10-CM | POA: Diagnosis not present

## 2021-12-15 DIAGNOSIS — E860 Dehydration: Secondary | ICD-10-CM | POA: Diagnosis present

## 2021-12-15 DIAGNOSIS — I509 Heart failure, unspecified: Secondary | ICD-10-CM

## 2021-12-15 DIAGNOSIS — Z20822 Contact with and (suspected) exposure to covid-19: Secondary | ICD-10-CM | POA: Diagnosis present

## 2021-12-15 DIAGNOSIS — E785 Hyperlipidemia, unspecified: Secondary | ICD-10-CM | POA: Diagnosis present

## 2021-12-15 DIAGNOSIS — N1832 Chronic kidney disease, stage 3b: Secondary | ICD-10-CM

## 2021-12-15 DIAGNOSIS — E875 Hyperkalemia: Secondary | ICD-10-CM | POA: Diagnosis present

## 2021-12-15 DIAGNOSIS — R7989 Other specified abnormal findings of blood chemistry: Secondary | ICD-10-CM | POA: Insufficient documentation

## 2021-12-15 DIAGNOSIS — E1122 Type 2 diabetes mellitus with diabetic chronic kidney disease: Secondary | ICD-10-CM | POA: Diagnosis present

## 2021-12-15 DIAGNOSIS — G9341 Metabolic encephalopathy: Secondary | ICD-10-CM | POA: Diagnosis present

## 2021-12-15 DIAGNOSIS — I69354 Hemiplegia and hemiparesis following cerebral infarction affecting left non-dominant side: Secondary | ICD-10-CM | POA: Diagnosis not present

## 2021-12-15 DIAGNOSIS — N179 Acute kidney failure, unspecified: Secondary | ICD-10-CM

## 2021-12-15 DIAGNOSIS — I130 Hypertensive heart and chronic kidney disease with heart failure and stage 1 through stage 4 chronic kidney disease, or unspecified chronic kidney disease: Secondary | ICD-10-CM | POA: Diagnosis present

## 2021-12-15 DIAGNOSIS — K5649 Other impaction of intestine: Secondary | ICD-10-CM | POA: Diagnosis present

## 2021-12-15 DIAGNOSIS — E872 Acidosis, unspecified: Secondary | ICD-10-CM | POA: Diagnosis present

## 2021-12-15 DIAGNOSIS — R627 Adult failure to thrive: Secondary | ICD-10-CM | POA: Diagnosis present

## 2021-12-15 DIAGNOSIS — Z7189 Other specified counseling: Secondary | ICD-10-CM | POA: Diagnosis not present

## 2021-12-15 DIAGNOSIS — N170 Acute kidney failure with tubular necrosis: Secondary | ICD-10-CM | POA: Diagnosis not present

## 2021-12-15 DIAGNOSIS — R778 Other specified abnormalities of plasma proteins: Secondary | ICD-10-CM

## 2021-12-15 DIAGNOSIS — J449 Chronic obstructive pulmonary disease, unspecified: Secondary | ICD-10-CM | POA: Diagnosis present

## 2021-12-15 DIAGNOSIS — I959 Hypotension, unspecified: Secondary | ICD-10-CM | POA: Diagnosis present

## 2021-12-15 DIAGNOSIS — Z9981 Dependence on supplemental oxygen: Secondary | ICD-10-CM | POA: Diagnosis not present

## 2021-12-15 DIAGNOSIS — G9389 Other specified disorders of brain: Secondary | ICD-10-CM | POA: Diagnosis not present

## 2021-12-15 LAB — CBC
HCT: 44.6 % (ref 36.0–46.0)
Hemoglobin: 13.4 g/dL (ref 12.0–15.0)
MCH: 29 pg (ref 26.0–34.0)
MCHC: 30 g/dL (ref 30.0–36.0)
MCV: 96.5 fL (ref 80.0–100.0)
Platelets: 228 10*3/uL (ref 150–400)
RBC: 4.62 MIL/uL (ref 3.87–5.11)
RDW: 16.7 % — ABNORMAL HIGH (ref 11.5–15.5)
WBC: 12.8 10*3/uL — ABNORMAL HIGH (ref 4.0–10.5)
nRBC: 0 % (ref 0.0–0.2)

## 2021-12-15 LAB — BASIC METABOLIC PANEL
Anion gap: 16 — ABNORMAL HIGH (ref 5–15)
BUN: 82 mg/dL — ABNORMAL HIGH (ref 8–23)
CO2: 20 mmol/L — ABNORMAL LOW (ref 22–32)
Calcium: 9.5 mg/dL (ref 8.9–10.3)
Chloride: 104 mmol/L (ref 98–111)
Creatinine, Ser: 3.7 mg/dL — ABNORMAL HIGH (ref 0.44–1.00)
GFR, Estimated: 12 mL/min — ABNORMAL LOW (ref >60)
Glucose, Bld: 88 mg/dL (ref 70–99)
Potassium: 5.3 mmol/L — ABNORMAL HIGH (ref 3.5–5.1)
Sodium: 140 mmol/L (ref 135–145)

## 2021-12-15 LAB — PROCALCITONIN: Procalcitonin: <0.1 ng/mL

## 2021-12-15 LAB — LACTIC ACID, PLASMA: Lactic Acid, Venous: 2.1 mmol/L (ref 0.5–1.9)

## 2021-12-15 MED ORDER — ATORVASTATIN CALCIUM 10 MG PO TABS
10.0000 mg | ORAL_TABLET | Freq: Every day | ORAL | Status: DC
  Administered 2021-12-16: 10 mg via ORAL
  Filled 2021-12-15: qty 1

## 2021-12-15 MED ORDER — CITALOPRAM HYDROBROMIDE 10 MG PO TABS
10.0000 mg | ORAL_TABLET | Freq: Every day | ORAL | Status: DC
  Administered 2021-12-16: 10 mg via ORAL
  Filled 2021-12-15: qty 1

## 2021-12-15 MED ORDER — POLYETHYLENE GLYCOL 3350 17 G PO PACK: 17.0000 g | PACK | Freq: Every day | ORAL | Status: DC

## 2021-12-15 MED ORDER — FUROSEMIDE 10 MG/ML IJ SOLN
80.0000 mg | Freq: Once | INTRAMUSCULAR | Status: DC
  Filled 2021-12-15: qty 8

## 2021-12-15 MED ORDER — DOCUSATE SODIUM 100 MG PO CAPS: 100.0000 mg | ORAL_CAPSULE | Freq: Two times a day (BID) | ORAL | Status: DC

## 2021-12-15 MED ORDER — ONDANSETRON HCL 4 MG/2ML IJ SOLN
4.0000 mg | Freq: Four times a day (QID) | INTRAMUSCULAR | Status: DC | PRN
  Administered 2021-12-16: 4 mg via INTRAVENOUS
  Filled 2021-12-15: qty 2

## 2021-12-15 MED ORDER — SODIUM CHLORIDE 0.9 % IV BOLUS
500.0000 mL | Freq: Once | INTRAVENOUS | Status: AC
  Administered 2021-12-15: 500 mL via INTRAVENOUS

## 2021-12-15 MED ORDER — MIDODRINE HCL 5 MG PO TABS
10.0000 mg | ORAL_TABLET | Freq: Three times a day (TID) | ORAL | Status: DC
  Administered 2021-12-16 (×2): 10 mg via ORAL
  Filled 2021-12-15 (×3): qty 2

## 2021-12-15 MED ORDER — PANTOPRAZOLE SODIUM 40 MG PO TBEC
40.0000 mg | DELAYED_RELEASE_TABLET | Freq: Every day | ORAL | Status: DC
  Administered 2021-12-16: 40 mg via ORAL
  Filled 2021-12-15: qty 1

## 2021-12-15 MED ORDER — APIXABAN 2.5 MG PO TABS
2.5000 mg | ORAL_TABLET | Freq: Two times a day (BID) | ORAL | Status: DC
  Administered 2021-12-15 – 2021-12-16 (×2): 2.5 mg via ORAL
  Filled 2021-12-15 (×3): qty 1

## 2021-12-15 MED ORDER — ACETAMINOPHEN 325 MG PO TABS: 650.0000 mg | ORAL_TABLET | Freq: Four times a day (QID) | ORAL | Status: DC | PRN

## 2021-12-15 MED ORDER — APIXABAN 2.5 MG PO TABS: 2.5000 mg | ORAL_TABLET | Freq: Two times a day (BID) | ORAL | Status: DC

## 2021-12-15 MED ORDER — LEVALBUTEROL HCL 0.63 MG/3ML IN NEBU: 0.6300 mg | INHALATION_SOLUTION | Freq: Four times a day (QID) | RESPIRATORY_TRACT | Status: DC | PRN

## 2021-12-15 MED ORDER — SENNA 8.6 MG PO TABS: 1.0000 | ORAL_TABLET | Freq: Every day | ORAL | Status: DC

## 2021-12-15 NOTE — ED Notes (Signed)
Patient transported to MRI 

## 2021-12-15 NOTE — Evaluation (Signed)
Clinical/Bedside Swallow Evaluation Patient Details  Name: ZAHLI VETSCH MRN: [id number] Date of Birth: Jun 07, 1940  Today's Date: 12/15/2021 Time: SLP Start Time (ACUTE ONLY): 1510 SLP Stop Time (ACUTE ONLY): 1530 SLP Time Calculation (min) (ACUTE ONLY): 20 min  Past Medical History:  Past Medical History:  Diagnosis Date   Cardiomyopathy    a. EF of 25% in 07/2006 b. EF normalized by repeat echo in 2011 c. EF 35-40% by echo in 05/2017 with cath showing mild nonobstructive CAD   Chronic combined systolic and diastolic CHF (congestive heart failure) (HCC)    Chronic combined systolic and diastolic heart failure (HCC)    Colon polyps    Diverticulosis of colon    Hypertension    Hypertensive heart disease    Osteopenia    Paroxysmal atrial fibrillation ([place]) 10/10/2018   Stroke ([place])    TIA (transient ischemic attack)    Tobacco abuse    50 pack year   Past Surgical History:  Past Surgical History:  Procedure Laterality Date   BREAST BIOPSY     CARPAL TUNNEL RELEASE     right hand   CERVICAL FUSION     CESAREAN SECTION     2 times   COLONOSCOPY  4098   pt uncertain as to whether polypectomy required or performed   COLONOSCOPY WITH PROPOFOL N/A 01/23/2020   Procedure: COLONOSCOPY WITH PROPOFOL;  Surgeon: Rogene Houston, MD;  [facility];  Service: Endoscopy;  Laterality: N/A;   ECTOPIC PREGNANCY SURGERY     ESOPHAGOGASTRODUODENOSCOPY (EGD) WITH PROPOFOL N/A 10/13/2018   Procedure: ESOPHAGOGASTRODUODENOSCOPY (EGD) WITH PROPOFOL;  Surgeon: Aviva Signs, MD;  Location: [facility];  Service: General;  Laterality: N/A;   ESOPHAGOGASTRODUODENOSCOPY (EGD) WITH PROPOFOL N/A 01/23/2020   Procedure: ESOPHAGOGASTRODUODENOSCOPY (EGD) WITH PROPOFOL;  Surgeon: Rogene Houston, MD;  [facility];  Service: Endoscopy;  Laterality: N/A;   IR ANGIO VERTEBRAL SEL SUBCLAVIAN INNOMINATE UNI R MOD SED  02/10/2018   IR CT HEAD LTD  02/10/2018   IR PERCUTANEOUS ART  THROMBECTOMY/INFUSION INTRACRANIAL INC DIAG ANGIO  02/10/2018   PEG PLACEMENT N/A 10/13/2018   Procedure: PERCUTANEOUS ENDOSCOPIC GASTROSTOMY (PEG) PLACEMENT;  Surgeon: Aviva Signs, MD;  Location: [facility];  Service: General;  Laterality: N/A;   PEG PLACEMENT N/A 12/04/2019   Procedure: PERCUTANEOUS ENDOSCOPIC GASTROSTOMY (PEG) REPLACEMENT;  Surgeon: Aviva Signs, MD;  [facility]: [facility];  Service: Gastroenterology;  Laterality: N/A;   PEG PLACEMENT N/A 12/12/2019   Procedure: PERCUTANEOUS ENDOSCOPIC GASTROSTOMY (PEG) REPLACEMENT;  Surgeon: Aviva Signs, MD;  [facility];  Service: Gastroenterology;  Laterality: N/A;   POLYPECTOMY  01/23/2020   Procedure: POLYPECTOMY;  Surgeon: Rogene Houston, MD;  Location: [facility];  Service: Endoscopy;;   RADIOLOGY WITH ANESTHESIA N/A 02/10/2018   Procedure: RADIOLOGY WITH ANESTHESIA;  Surgeon: Luanne Bras, MD;  Location: [facility];  Service: Radiology;  Laterality: N/A;   RIGHT/LEFT HEART CATH AND CORONARY ANGIOGRAPHY N/A 06/03/2017   Procedure: RIGHT/LEFT HEART CATH AND CORONARY ANGIOGRAPHY;  Surgeon: Jettie Booze, MD;  Location: [facility];  Service: Cardiovascular;  Laterality: N/A;   TEE WITHOUT CARDIOVERSION N/A 02/13/2018   Procedure: TRANSESOPHAGEAL ECHOCARDIOGRAM (TEE);  Surgeon: Sueanne Margarita, MD;  Location: [facility] ENDOSCOPY;  Service: Cardiovascular;  Laterality: N/A;   TONSILLECTOMY     HPI:  chronic combined CHF/nonischemic cardiomyopathy, paroxysmal A-fib on Eliquis, COPD on home oxygen, CVA with left hemiplegia, bed and wheelchair bound, severe protein calories malnutrition, CKD stage  IIIb, hypertension.  Recent outpatient echo done 09/23/2021 noted EF 20 to 25%, severe LVH, grade 3 diastolic dysfunction, small pericardial effusion, moderately reduced RV function, and myocardial pattern concerning for cardiac amyloidosis.    Assessment / Plan / Recommendation  Clinical Impression  Patient is presenting with  clinical s/s of dysphagia as per this bedside/clinical swallow evaluation. She was in bed, alert but lethargic, spouse in room. Patient able to follow minimal amount of directions for oral care. SLP started using toothette swab to clean around in oral cavity and removed small amount of foul-smelling food particles from back of oral cavity along gumline. Patient allowed SLP to remove her top dentures for cleaning and more food material present. Per patient's spouse, patient had not eaten anything for past approximately three days. SLP explained to spouse that food material found in her oral cavity had likely been there for a while. Patient was alert enough to attempt to accept a very small amount of puree (applesauce) from spoon, but she was not able to move bolus past her lips and so SLP removed it and did not attempt any other PO's. SLP is recommending to continue with NPO status and will f/u next date for PO trials if patient more awake/alert. SLP Visit Diagnosis: Dysphagia, unspecified (R13.10)    Aspiration Risk  Severe aspiration risk;Risk for inadequate nutrition/hydration    Diet Recommendation NPO   Medication Administration: Via alternative means    Other  Recommendations Oral Care Recommendations: Oral care QID;Staff/trained caregiver to provide oral care    Recommendations for follow up therapy are one component of a multi-disciplinary discharge planning process, led by the attending physician.  Recommendations may be updated based on patient status, additional functional criteria and insurance authorization.  Follow up Recommendations Follow physician's recommendations for discharge plan and follow up therapies      Assistance Recommended at Discharge Frequent or constant Supervision/Assistance  Functional Status Assessment Patient has had a recent decline in their functional status and demonstrates the ability to make significant improvements in function in a reasonable and  predictable amount of time.  Frequency and Duration min 2x/week  1 week       Prognosis Prognosis for Safe Diet Advancement: Fair Barriers to Reach Goals: Time post onset;Severity of deficits      Swallow Study   General Date of Onset: 12/15/21 HPI: chronic combined CHF/nonischemic cardiomyopathy, paroxysmal A-fib on Eliquis, COPD on home oxygen, CVA with left hemiplegia, bed and wheelchair bound, severe protein calories malnutrition, CKD stage IIIb, hypertension.  Recent outpatient echo done 09/23/2021 noted EF 20 to 25%, severe LVH, grade 3 diastolic dysfunction, small pericardial effusion, moderately reduced RV function, and myocardial pattern concerning for cardiac amyloidosis. Type of Study: Bedside Swallow Evaluation Previous Swallow Assessment: during previous hospitalizations (BSE's, MBS) Diet Prior to this Study: NPO Temperature Spikes Noted: No Respiratory Status: Room air History of Recent Intubation: No Behavior/Cognition: Cooperative;Lethargic/Drowsy;Requires cueing;Alert Oral Cavity Assessment: Other (comment) (bits of foul-smelling food material removed from oral cavity and from dentures.) Oral Care Completed by SLP: Yes Oral Cavity - Dentition: Dentures, top;Edentulous Self-Feeding Abilities: Total assist Patient Positioning: Upright in bed Baseline Vocal Quality: Not observed Volitional Cough: Cognitively unable to elicit Volitional Swallow: Unable to elicit    Oral/Motor/Sensory Function Overall Oral Motor/Sensory Function: Moderate impairment Facial ROM: Reduced left Facial Symmetry: Abnormal symmetry left Facial Strength: Reduced left Lingual ROM: Reduced left Lingual Strength: Reduced Mandible: Impaired   Ice Chips     Thin Liquid Thin Liquid: Not  tested    Nectar Thick     Honey Thick     Puree Puree: Impaired Oral Phase Impairments: Poor awareness of bolus;Reduced lingual movement/coordination   Solid     Solid: Not tested      Sonia Baller,  MA, CCC-SLP Speech Therapy

## 2021-12-15 NOTE — ED Notes (Signed)
ED TO INPATIENT HANDOFF REPORT  ED Nurse Name and Phone #: [phone]  S Name/Age/Gender Brandy [name] 81 y.o. female Room/Bed: 019C/019C  Code Status   Code Status: DNR  Home/SNF/Other Home Pt is unable to speak and answer questions  Triage Complete: Triage complete  Chief Complaint CHF exacerbation ([place]) [I50.9] AKI (acute kidney injury) ([place]) [N17.9]  Triage Note Pt bib [facility] from PCP for follow-up from abd pain. Pt on home O2., caregiver believes pt tank ranout. Pt was in & Out of consciousness. Pt eyes were open, but not tracking. EMS unable to obtain BP and radial pulses originally. Enroute EMS able to feel pulses and BP of 50/30. Pt talks at baseline.   EMS vitals 50/30 BP 70 palp BP 110 CBG 80-120 HR     Allergies Allergies  Allergen Reactions   Lisinopril Swelling   Jardiance [Empagliflozin] Rash    Level of Care/Admitting Diagnosis ED Disposition     ED Disposition  Admit   Condition  --   Comment  Hospital Area: [facility] [[id number]]  Level of Care: Progressive [102]  Admit to Progressive based on following criteria: CARDIOVASCULAR & THORACIC of moderate stability with acute coronary syndrome symptoms/low risk myocardial infarction/hypertensive urgency/arrhythmias/heart failure potentially compromising stability and stable post cardiovascular intervention patients.  May admit patient to [facility] or [facility] if equivalent level of care is available:: No  Covid Evaluation: Asymptomatic - no recent exposure (last 10 days) testing not required  Diagnosis: AKI (acute kidney injury) [facility]) [[id number]]  Admitting Physician: [place], [place]  Attending Physician: Kathie Dike [[id number]]  Certification:: I certify this patient will need inpatient services for at least 2 midnights  Estimated Length of Stay: 4          B Medical/Surgery History Past Medical History:  Diagnosis Date   Cardiomyopathy    a. EF of 25% in 07/2006  b. EF normalized by repeat echo in 2011 c. EF 35-40% by echo in 05/2017 with cath showing mild nonobstructive CAD   Chronic combined systolic and diastolic CHF (congestive heart failure) ([place])    Chronic combined systolic and diastolic heart failure ([place])    Colon polyps    Diverticulosis of colon    Hypertension    Hypertensive heart disease    Osteopenia    Paroxysmal atrial fibrillation ([place]) 10/10/2018   Stroke ([place])    TIA (transient ischemic attack)    Tobacco abuse    50 pack year   Past Surgical History:  Procedure Laterality Date   BREAST BIOPSY     CARPAL TUNNEL RELEASE     right hand   CERVICAL FUSION     CESAREAN SECTION     2 times   COLONOSCOPY  7416   pt uncertain as to whether polypectomy required or performed   COLONOSCOPY WITH PROPOFOL N/A 01/23/2020   Procedure: COLONOSCOPY WITH PROPOFOL;  Surgeon: Rogene Houston, MD;  [facility];  Service: Endoscopy;  Laterality: N/A;   ECTOPIC PREGNANCY SURGERY     ESOPHAGOGASTRODUODENOSCOPY (EGD) WITH PROPOFOL N/A 10/13/2018   Procedure: ESOPHAGOGASTRODUODENOSCOPY (EGD) WITH PROPOFOL;  Surgeon: Aviva Signs, MD;  Location: [facility] ORS;  Service: General;  Laterality: N/A;   ESOPHAGOGASTRODUODENOSCOPY (EGD) WITH PROPOFOL N/A 01/23/2020   Procedure: ESOPHAGOGASTRODUODENOSCOPY (EGD) WITH PROPOFOL;  Surgeon: Rogene Houston, MD;  [facility];  Service: Endoscopy;  Laterality: N/A;   IR ANGIO VERTEBRAL SEL SUBCLAVIAN INNOMINATE UNI R MOD SED  02/10/2018   IR  CT HEAD LTD  02/10/2018   IR PERCUTANEOUS ART THROMBECTOMY/INFUSION INTRACRANIAL INC DIAG ANGIO  02/10/2018   PEG PLACEMENT N/A 10/13/2018   Procedure: PERCUTANEOUS ENDOSCOPIC GASTROSTOMY (PEG) PLACEMENT;  Surgeon: Aviva Signs, MD;  Location: [facility] OR[facility];  Service: General;  Laterality: N/A;   PEG PLACEMENT N/A 12/04/2019   Procedure: PERCUTANEOUS ENDOSCOPIC GASTROSTOMY (PEG) REPLACEMENT;  Surgeon: Aviva Signs, MD;  [facility];  Service:  Gastroenterology;  Laterality: N/A;   PEG PLACEMENT N/A 12/12/2019   Procedure: PERCUTANEOUS ENDOSCOPIC GASTROSTOMY (PEG) REPLACEMENT;  Surgeon: Aviva Signs, MD;  [facility];  Service: Gastroenterology;  Laterality: N/A;   POLYPECTOMY  01/23/2020   Procedure: POLYPECTOMY;  Surgeon: Rogene Houston, MD;  [facility];  Service: Endoscopy;;   RADIOLOGY WITH ANESTHESIA N/A 02/10/2018   Procedure: RADIOLOGY WITH ANESTHESIA;  Surgeon: Luanne Bras, MD;  Location: [facility];  Service: Radiology;  Laterality: N/A;   RIGHT/LEFT HEART CATH AND CORONARY ANGIOGRAPHY N/A 06/03/2017   Procedure: RIGHT/LEFT HEART CATH AND CORONARY ANGIOGRAPHY;  Surgeon: Jettie Booze, MD;  Location: [facility];  Service: Cardiovascular;  Laterality: N/A;   TEE WITHOUT CARDIOVERSION N/A 02/13/2018   Procedure: TRANSESOPHAGEAL ECHOCARDIOGRAM (TEE);  Surgeon: Sueanne Margarita, MD;  Location: [facility] ENDOSCOPY;  Service: Cardiovascular;  Laterality: N/A;   TONSILLECTOMY       A IV Location/Drains/Wounds Patient Lines/Drains/Airways Status     Active Line/Drains/Airways     Name Placement date Placement time Site Days   Peripheral IV 12/16/2021 18 G Anterior;Right Hand 12/19/2021  1538  Hand  1   Peripheral IV 12/19/2021 20 G Anterior;Left;Upper Arm 12/04/2021  1605  Arm  1   Gastrostomy/Enterostomy Percutaneous endoscopic gastrostomy (PEG) 20 Fr. LUQ 10/13/18  1157  LUQ  1159   Gastrostomy/Enterostomy Percutaneous endoscopic gastrostomy (PEG) 16 Fr. LUQ --  --  LUQ  --   Wound / Incision (Open or Dehisced) 03/31/18 Other (Comment) Sacrum Right;Left 3 wounds on sacrum noted. Site cleansed. New foam placed. Present prior to admission per patient's family at bedside today 03/31/18  0755  Sacrum  1355            Intake/Output Last 24 hours No intake or output data in the 24 hours ending 12/15/21 1454  Labs/Imaging Results for orders placed or performed during the hospital encounter of  12/18/2021 (from the past 48 hour(s))  SARS Coronavirus 2 by RT PCR (hospital order, performed in [facility] hospital lab) *cepheid single result test* Anterior Nasal Swab     Status: None   Collection Time: 12/11/2021  3:47 PM   Specimen: Anterior Nasal Swab  Result Value Ref Range   SARS Coronavirus 2 by RT PCR NEGATIVE NEGATIVE    Comment: (NOTE) SARS-CoV-2 target nucleic acids are NOT DETECTED.  The SARS-CoV-2 RNA is generally detectable in upper and lower respiratory specimens during the acute phase of infection. The lowest concentration of SARS-CoV-2 viral copies this assay can detect is 250 copies / mL. A negative result does not preclude SARS-CoV-2 infection and should not be used as the sole basis for treatment or other patient management decisions.  A negative result may occur with improper specimen collection / handling, submission of specimen other than nasopharyngeal swab, presence of viral mutation(s) within the areas targeted by this assay, and inadequate number of viral copies (<250 copies / mL). A negative result must be combined with clinical observations, patient history, and epidemiological information.  Fact Sheet for Patients:   https://www.patel.info/  Fact Sheet for Healthcare Providers: https://hall.com/  This test is not yet approved or  cleared by the [place] FDA and has been authorized for detection and/or diagnosis of SARS-CoV-2 by FDA under an Emergency Use Authorization (EUA).  This EUA will remain in effect (meaning this test can be used) for the duration of the COVID-19 declaration under Section 564(b)(1) of the Act, 21 U.S.C. section 360bbb-3(b)(1), unless the authorization is terminated or revoked sooner.  Performed at [facility], [place] [address], [place], [place] [address]   CBC with Differential     Status: Abnormal   Collection Time: 12/13/2021  4:10 PM  Result Value Ref Range   WBC 9.0  4.0 - 10.5 K/uL   RBC 4.80 3.87 - 5.11 MIL/uL   Hemoglobin 14.0 12.0 - 15.0 g/dL   HCT 44.9 36.0 - 46.0 %   MCV 93.5 80.0 - 100.0 fL   MCH 29.2 26.0 - 34.0 pg   MCHC 31.2 30.0 - 36.0 g/dL   RDW 16.6 (H) 11.5 - 15.5 %   Platelets 243 150 - 400 K/uL   nRBC 0.0 0.0 - 0.2 %   Neutrophils Relative % 80 %   Neutro Abs 7.1 1.7 - 7.7 K/uL   Lymphocytes Relative 15 %   Lymphs Abs 1.4 0.7 - 4.0 K/uL   Monocytes Relative 5 %   Monocytes Absolute 0.4 0.1 - 1.0 K/uL   Eosinophils Relative 0 %   Eosinophils Absolute 0.0 0.0 - 0.5 K/uL   Basophils Relative 0 %   Basophils Absolute 0.0 0.0 - 0.1 K/uL   Immature Granulocytes 0 %   Abs Immature Granulocytes 0.03 0.00 - 0.07 K/uL    Comment: Performed at [facility] [address] [place], [place] [address]  Comprehensive metabolic panel     Status: Abnormal   Collection Time: 12/19/2021  4:10 PM  Result Value Ref Range   Sodium 140 135 - 145 mmol/L   Potassium 4.5 3.5 - 5.1 mmol/L   Chloride 105 98 - 111 mmol/L   CO2 18 (L) 22 - 32 mmol/L   Glucose, Bld 98 70 - 99 mg/dL    Comment: Glucose reference range applies only to samples taken after fasting for at least 8 hours.   BUN 79 (H) 8 - 23 mg/dL   Creatinine, Ser 3.55 (H) 0.44 - 1.00 mg/dL   Calcium 9.6 8.9 - 10.3 mg/dL   Total Protein 6.2 (L) 6.5 - 8.1 g/dL   Albumin 3.1 (L) 3.5 - 5.0 g/dL   AST 23 15 - 41 U/L   ALT 14 0 - 44 U/L   Alkaline Phosphatase 57 38 - 126 U/L   Total Bilirubin 1.3 (H) 0.3 - 1.2 mg/dL   GFR, Estimated 13 (L) >60 mL/min    Comment: (NOTE) Calculated using the CKD-EPI Creatinine Equation (2021)    Anion gap 17 (H) 5 - 15    Comment: Performed at [facility], [place] [address] [place], [place] [address]  Magnesium     Status: None   Collection Time: 12/17/2021  4:10 PM  Result Value Ref Range   Magnesium 2.1 1.7 - 2.4 mg/dL    Comment: Performed at [facility] [address], [place], [place] [address]  Troponin I (High Sensitivity)      Status: Abnormal   Collection Time: 12/04/2021  4:10 PM  Result Value Ref Range   Troponin I (High Sensitivity) 387 (HH) <18 ng/L  Comment: CRITICAL RESULT CALLED TO, READ BACK BY AND VERIFIED WITH A. BRITT RN 12/10/2021 '@1727'$  BY J. WHITE (NOTE) Elevated high sensitivity troponin I (hsTnI) values and significant  changes across serial measurements may suggest ACS but many other  chronic and acute conditions are known to elevate hsTnI results.  Refer to the "Links" section for chest pain algorithms and additional  guidance. Performed at [facility], [place] [address], [place], [place] [address]   Brain natriuretic peptide     Status: Abnormal   Collection Time: 12/19/2021  4:10 PM  Result Value Ref Range   B Natriuretic Peptide 3,355.5 (H) 0.0 - 100.0 pg/mL    Comment: Performed at [facility] [address] [place], [place] [address]  Ammonia     Status: None   Collection Time: 12/16/2021  4:10 PM  Result Value Ref Range   Ammonia 24 9 - 35 umol/L    Comment: Performed at [facility], [place] [address], [place], [place] [address]  Lactic acid, plasma     Status: Abnormal   Collection Time: 12/11/2021  4:10 PM  Result Value Ref Range   Lactic Acid, Venous 2.3 (HH) 0.5 - 1.9 mmol/L    Comment: CRITICAL RESULT CALLED TO, READ BACK BY AND VERIFIED WITH A. BRITT RN 12/01/2021 '@1727'$  BY J. WHITE Performed at [facility] [address], [place], [place] [address]   Protime-INR     Status: Abnormal   Collection Time: 11/27/2021  4:10 PM  Result Value Ref Range   Prothrombin Time 30.7 (H) 11.4 - 15.2 seconds   INR 3.0 (H) 0.8 - 1.2    Comment: (NOTE) INR goal varies based on device and disease states. Performed at [facility], [place] [address], [place], [place] [address]   Culture, blood (Routine x 2)     Status: None (Preliminary result)   Collection Time: 12/22/2021  4:10 PM   Specimen: BLOOD  Result Value Ref Range   Specimen Description BLOOD BLOOD LEFT ARM     Special Requests      BOTTLES DRAWN AEROBIC AND ANAEROBIC Blood Culture results may not be optimal due to an excessive volume of blood received in culture bottles   Culture      NO GROWTH < 24 HOURS Performed at [facility] [address], [place], [place] [address]    Report Status PENDING   Troponin I (High Sensitivity)     Status: Abnormal   Collection Time: 12/02/2021  5:46 PM  Result Value Ref Range   Troponin I (High Sensitivity) 406 (HH) <18 ng/L    Comment: CRITICAL VALUE NOTED. VALUE IS CONSISTENT WITH PREVIOUSLY REPORTED/CALLED VALUE (NOTE) Elevated high sensitivity troponin I (hsTnI) values and significant  changes across serial measurements may suggest ACS but many other  chronic and acute conditions are known to elevate hsTnI results.  Refer to the "Links" section for chest pain algorithms and additional  guidance. Performed at [facility], [place] [address] [place], [place] [address]   Lactic acid, plasma     Status: Abnormal   Collection Time: 12/18/2021  5:52 PM  Result Value Ref Range   Lactic Acid, Venous 2.8 (HH) 0.5 - 1.9 mmol/L    Comment: CRITICAL VALUE NOTED. VALUE IS CONSISTENT WITH PREVIOUSLY REPORTED/CALLED VALUE Performed at [facility], [place] [address], [place], [place] [address]   Basic metabolic panel     Status: Abnormal  Collection Time: 12/15/21  2:36 AM  Result Value Ref Range   Sodium 140 135 - 145 mmol/L   Potassium 5.3 (H) 3.5 - 5.1 mmol/L   Chloride 104 98 - 111 mmol/L   CO2 20 (L) 22 - 32 mmol/L   Glucose, Bld 88 70 - 99 mg/dL    Comment: Glucose reference range applies only to samples taken after fasting for at least 8 hours.   BUN 82 (H) 8 - 23 mg/dL   Creatinine, Ser 3.70 (H) 0.44 - 1.00 mg/dL   Calcium 9.5 8.9 - 10.3 mg/dL   GFR, Estimated 12 (L) >60 mL/min    Comment: (NOTE) Calculated using the CKD-EPI Creatinine Equation (2021)    Anion gap 16 (H) 5 - 15    Comment: Performed at [facility]  [address], [place], [place] [address]  CBC     Status: Abnormal   Collection Time: 12/15/21  2:36 AM  Result Value Ref Range   WBC 12.8 (H) 4.0 - 10.5 K/uL   RBC 4.62 3.87 - 5.11 MIL/uL   Hemoglobin 13.4 12.0 - 15.0 g/dL   HCT 44.6 36.0 - 46.0 %   MCV 96.5 80.0 - 100.0 fL   MCH 29.0 26.0 - 34.0 pg   MCHC 30.0 30.0 - 36.0 g/dL   RDW 16.7 (H) 11.5 - 15.5 %   Platelets 228 150 - 400 K/uL   nRBC 0.0 0.0 - 0.2 %    Comment: Performed at [facility], [place] [address] [place], [place] [address]  Procalcitonin - Baseline     Status: None   Collection Time: 12/15/21  2:36 AM  Result Value Ref Range   Procalcitonin <0.10 ng/mL    Comment: Performed at [facility] [address], [place], [place] [address]  Lactic acid, plasma     Status: Abnormal   Collection Time: 12/15/21  2:36 AM  Result Value Ref Range   Lactic Acid, Venous 2.1 (HH) 0.5 - 1.9 mmol/L    Comment: CRITICAL VALUE NOTED. VALUE IS CONSISTENT WITH PREVIOUSLY REPORTED/CALLED VALUE Performed at [facility], [place] [address], [place], [place] [address]    MR BRAIN WO CONTRAST  Result Date: 12/15/2021 CLINICAL DATA:  Initial evaluation for neuro deficit, stroke suspected. EXAM: MRI HEAD WITHOUT CONTRAST TECHNIQUE: Multiplanar, multiecho pulse sequences of the brain and surrounding structures were obtained without intravenous contrast. COMPARISON:  CT from 12/08/2021. FINDINGS: Brain: Generalized age-related cerebral atrophy. Patchy and confluent T2/FLAIR hyperintensity involving the periventricular and deep white matter both cerebral hemispheres, most consistent with chronic small vessel ischemic disease. Encephalomalacia and gliosis involving the right cerebral hemisphere, consistent with a chronic right MCA distribution infarct. Scattered areas of associated chronic hemosiderin staining present within this region. Associated wallerian degeneration/atrophy present at the right cerebral peduncle and midbrain. Additional small  remote left cerebellar infarct noted. No evidence for acute or subacute ischemia. Gray-white matter differentiation otherwise maintained. No acute intracranial hemorrhage. No mass lesion or significant mass effect. Ex vacuo dilatation of the right lateral ventricle related to the chronic right cerebral encephalomalacia. No hydrocephalus. No extra-axial fluid collection. Pituitary gland suprasellar region within normal limits. Vascular: Mildly heterogeneous flow void within the horizontal petrous right ICA, likely slow flow. This is presumably a chronic finding given the chronic right MCA territory infarct and lack of acute ischemic changes. Major intracranial vascular flow voids are otherwise maintained. Skull and upper cervical spine: Craniocervical junction within normal limits. Postsurgical changes  partially visualize within the cervical spine. Bone marrow signal intensity within normal limits. No scalp soft tissue abnormality. Sinuses/Orbits: Patient status post bilateral ocular lens replacement. Left maxillary sinus retention cyst noted. Scattered mucosal thickening noted within the ethmoidal air cells and maxillary sinuses as well. No significant mastoid effusion. Other: None. IMPRESSION: 1. No acute intracranial abnormality. 2. Large remote right MCA distribution infarct, with additional small left cerebellar infarct. 3. Underlying age-related cerebral atrophy with moderate chronic microvascular ischemic disease. Electronically Signed   By: Jeannine Boga M.D.   On: 12/15/2021 03:07   CT ABDOMEN PELVIS WO CONTRAST  Result Date: 12/06/2021 CLINICAL DATA:  Acute abdominal pain EXAM: CT ABDOMEN AND PELVIS WITHOUT CONTRAST TECHNIQUE: Multidetector CT imaging of the abdomen and pelvis was performed following the standard protocol without IV contrast. RADIATION DOSE REDUCTION: This exam was performed according to the departmental dose-optimization program which includes automated exposure control,  adjustment of the mA and/or kV according to patient size and/or use of iterative reconstruction technique. COMPARISON:  11/26/2021 FINDINGS: Lower chest: Bilateral moderate pleural effusions right slightly greater than left. Some associated atelectatic changes are noted. Hepatobiliary: Gallbladder is within normal limits. Liver again demonstrates right hepatic cyst stable from the prior study. Pancreas: Unremarkable. No pancreatic ductal dilatation or surrounding inflammatory changes. Spleen: Normal in size without focal abnormality. Adrenals/Urinary Tract: Adrenal glands are within normal limits. Left renal cyst is noted and stable. No further follow-up is recommended. No obstructive changes are seen. The bladder is decompressed. Stomach/Bowel: Findings of rectal impaction noted with wall thickening and Peri rectal inflammatory changes consistent with stercoral proctitis. Remainder of the colon appears within normal limits. Appendix is unremarkable. Small bowel and stomach are within normal limits. Vascular/Lymphatic: Aortic atherosclerosis. No enlarged abdominal or pelvic lymph nodes. Reproductive: Uterus and bilateral adnexa are unremarkable. Other: No abdominal wall hernia or abnormality. No abdominopelvic ascites. Musculoskeletal: Degenerative changes of lumbar spine are noted. No compression deformity is noted. IMPRESSION: Findings of rectal impaction with wall thickening and. Rectal inflammatory change consistent with proctitis. This has increased somewhat in the interval from the prior exam. Bilateral moderate pleural effusions with associated atelectatic changes. Hepatic and renal cysts.  No follow-up is recommended. Electronically Signed   By: Inez Catalina M.D.   On: 12/11/2021 21:54   CT Head Wo Contrast  Result Date: 12/11/2021 CLINICAL DATA:  Altered mental status EXAM: CT HEAD WITHOUT CONTRAST TECHNIQUE: Contiguous axial images were obtained from the base of the skull through the vertex without  intravenous contrast. RADIATION DOSE REDUCTION: This exam was performed according to the departmental dose-optimization program which includes automated exposure control, adjustment of the mA and/or kV according to patient size and/or use of iterative reconstruction technique. COMPARISON:  02/10/18 FINDINGS: Brain: There are changes consistent with prior right MCA infarct new from the prior CT examination. Chronic atrophic changes are noted. No acute infarct is seen. No acute hemorrhage or space-occupying mass lesion is noted. Vascular: No hyperdense vessel or unexpected calcification. Skull: Normal. Negative for fracture or focal lesion. Sinuses/Orbits: Mucosal retention cyst is noted in the left maxillary antrum. Other: None. 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 Atrophic changes without acute abnormality. Electronically Signed   By: Inez Catalina M.D.   On: 12/13/2021 21:51   DG Chest Portable 1 View  Result Date: 12/10/2021 CLINICAL DATA:  sob EXAM: PORTABLE CHEST 1 VIEW COMPARISON:  12/01/2021. FINDINGS: Similar large of the cardiac silhouette. Similar layering bilateral pleural  effusions with overlying bibasilar opacities. No visible pneumothorax. Scoliosis and polyarticular degenerative change. ACDF. IMPRESSION: 1. Similar layering bilateral pleural effusion with overlying atelectasis and/or consolidation. 2. Similar cardiomegaly. Electronically Signed   By: Margaretha [facility] M.D.   On: 12/18/2021 16:41    Pending Labs Unresulted Labs (From admission, onward)     Start     Ordered   12/09/2021 1552  Culture, blood (Routine x 2)  BLOOD CULTURE X 2,   R      11/27/2021 1552   12/15/2021 1552  Urinalysis, Routine w reflex microscopic  Once,   URGENT        11/29/2021 1552            Vitals/Pain Today's Vitals   12/15/21 1232 12/15/21 1245 12/15/21 1300 12/15/21 1315  BP:  (!) 92/40 (!) 82/64 99/82  Pulse:  (!) 57  (!) 138   Resp:  18 17 (!) 23  Temp: 97.9 F (36.6 C)     TempSrc: Oral     SpO2:  98%  100%    Isolation Precautions No active isolations  Medications Medications  midodrine (PROAMATINE) tablet 10 mg (0 mg Oral Hold 12/15/21 1313)  acetaminophen (TYLENOL) tablet 650 mg (has no administration in time range)  ondansetron (ZOFRAN) injection 4 mg (has no administration in time range)  levalbuterol (XOPENEX) nebulizer solution 0.63 mg (has no administration in time range)  pantoprazole (PROTONIX) EC tablet 40 mg (0 mg Oral Hold 12/15/21 1314)  citalopram (CELEXA) tablet 10 mg (0 mg Oral Hold 12/15/21 1313)  atorvastatin (LIPITOR) tablet 10 mg (0 mg Oral Hold 12/15/21 1313)  apixaban (ELIQUIS) tablet 2.5 mg (0 mg Oral Hold 12/15/21 1312)  sodium chloride 0.9 % bolus 250 mL (0 mLs Intravenous Stopped 11/28/2021 1759)  sodium chloride 0.9 % bolus 500 mL (0 mLs Intravenous Stopped 12/15/21 1140)    Mobility non-ambulatory High fall risk   Focused Assessments Cardiac Assessment Handoff:    Lab Results  Component Value Date   TROPONINI 0.33 (HH) 03/29/2018   Lab Results  Component Value Date   DDIMER 1.88 (H) 09/13/2017   Does the Patient currently have chest pain? No   , Neuro Assessment Handoff:  Swallow screen pass?  SLP to eval         Neuro Assessment: Exceptions to W[facility] Neuro Checks:      Last Documented NIHSS Modified Score:   Has TPA been given? No If patient is a Neuro Trauma and patient is going to OR before floor call report to [place] nurse: [phone] or [phone]  , Pulmonary Assessment Handoff:  Lung sounds:   O2 Device: Nasal Cannula O2 Flow Rate (L/min): 2 L/min    R Recommendations: See Admitting Provider Note  Report given to:   Additional Notes: bed bound at baseline. Left side weak from previous strokes

## 2021-12-15 NOTE — Progress Notes (Addendum)
PROGRESS NOTE    Brandy [name]  NGE:[id number] DOB: 04-24-1940 DOA: 12/16/2021 PCP: [name], MD    Brief Narrative:  81 year old female with a history of cardiomyopathy, ejection fraction of 20%, chronic kidney disease stage IIIb, atrial fibrillation on anticoagulation, prior stroke with left-sided hemiplegia, bedbound status, recently discharged from the hospital on 9/2 after being treated for heart failure exacerbation.  She was brought back to the hospital with changes in mental status and shortness of breath.  Found to be hypotensive on arrival.  Imaging indicated bilateral pleural effusions.  Also noted to have acute kidney injury with a creatinine of 3.7.  She is in rapid atrial fibrillation.  Admitted for further management.   Assessment & Plan:   Principal Problem:   CHF exacerbation ([place]) Active Problems:   CKD (chronic kidney disease) stage 3, GFR 30-59 ml/min (HCC)   Chronic combined systolic and diastolic CHF (congestive heart failure) (HCC)   History of CVA with residual deficit   Left hemiplegia (HCC)   AKI (acute kidney injury) ([place])   Acute renal failure superimposed on stage 3 chronic kidney disease (HCC)   Atrial fibrillation with rapid ventricular response (HCC)   Elevated troponin   Chronic respiratory failure with hypoxia (HCC)   Acute kidney injury on chronic kidney disease stage IIIb. -Likely related to low cardiac output/cardiorenal -Case reviewed with Dr. Moshe Cipro, nephrology and we both felt that patient would not be an appropriate candidate for long-term dialysis  Acute on chronic combined CHF -Recent echocardiogram shows EF of 20% with grade 3 diastolic dysfunction -She does have bilateral pleural effusions -We did ongoing marginal blood pressure, does not appear to be reasonable to start diuresis at this point -Cardiology consulted to see if she would be a candidate for inotropes  Rapid atrial fibrillation -Unable to use beta-blockers due  to hypotension -?  If amiodarone would be helpful -She is on chronic anticoagulation with Eliquis  Acute encephalopathy -At baseline, she reportedly is able to make her needs known -Currently, she is awake, she does not do some questions, unclear how much she is truly comprehending -MRI brain did not show any acute changes  Chronic respiratory failure with hypoxia COPD -Sitter at bedside reports that she is chronically on 4 to 5 L at home  Stercoral proctitis -Patient is now having bowel movements -Continue on MiraLAX  History of CVA With residual left-sided hemiplegia Bedbound/wheelchair-bound  Goals of care -With multiorgan dysfunction, poor functional and nutritional status, her long-term prognosis is poor -Palliative care is consulted to help with goals of care -I had an honest discussion with the patient's husband and her daughter.  I explained the patient's multiple comorbidities and overall poor prognosis -They agreed to DNR status, but wish to continue with other available treatments -We will need to have continued discussions regarding goals of care and possible transition to comfort as her condition evolves   DVT prophylaxis:   Code Status: DNR Family Communication: Updated patient's husband and daughter over the phone Disposition Plan: Status is: Inpatient Remains inpatient appropriate because: Continued management of acute kidney injury and decompensated CHF     Consultants:  Cardiology Palliative care  Procedures:    Antimicrobials:      Subjective: Patient is awake, she nods her head when asked if she knows where she is.  Cannot answer any questions further  Objective: Vitals:   12/15/21 0915 12/15/21 0930 12/15/21 0945 12/15/21 1000  BP: 106/78 98/74 (!) 85/68 91/78  Pulse: (!) 52   (!)  25  Resp: (!) '26 14 16 '$ (!) 32  Temp:      TempSrc:      SpO2: 100%   100%   No intake or output data in the 24 hours ending 12/15/21 1203 There were no  vitals filed for this visit.  Examination:  General exam: Appears calm and comfortable  Respiratory system: Diminished breath sounds at bases, increased respiratory effort Cardiovascular system: S1 & S2 heard, able. No JVD, murmurs, rubs, gallops or clicks.  Gastrointestinal system: Abdomen is nondistended, soft and nontender. No organomegaly or masses felt. Normal bowel sounds heard. Central nervous system: Awake, does not move her arms and legs on request Extremities: No edema bilaterally Skin: No rashes, lesions or ulcers Psychiatry: Mood to assess    Data Reviewed: I have personally reviewed following labs and imaging studies  CBC: Recent Labs  Lab 11/30/2021 1610 12/15/21 0236  WBC 9.0 12.8*  NEUTROABS 7.1  --   HGB 14.0 13.4  HCT 44.9 44.6  MCV 93.5 96.5  PLT 243 149   Basic Metabolic Panel: Recent Labs  Lab 12/25/2021 1610 12/15/21 0236  NA 140 140  K 4.5 5.3*  CL 105 104  CO2 18* 20*  GLUCOSE 98 88  BUN 79* 82*  CREATININE 3.55* 3.70*  CALCIUM 9.6 9.5  MG 2.1  --    GFR: CrCl cannot be calculated (Unknown ideal weight.). Liver Function Tests: Recent Labs  Lab 12/23/2021 1610  AST 23  ALT 14  ALKPHOS 57  BILITOT 1.3*  PROT 6.2*  ALBUMIN 3.1*   No results for input(s): "LIPASE", "AMYLASE" in the last 168 hours. Recent Labs  Lab 12/01/2021 1610  AMMONIA 24   Coagulation Profile: Recent Labs  Lab 12/26/2021 1610  INR 3.0*   Cardiac Enzymes: No results for input(s): "CKTOTAL", "CKMB", "CKMBINDEX", "TROPONINI" in the last 168 hours. BNP (last 3 results) No results for input(s): "PROBNP" in the last 8760 hours. HbA1C: No results for input(s): "HGBA1C" in the last 72 hours. CBG: No results for input(s): "GLUCAP" in the last 168 hours. Lipid Profile: No results for input(s): "CHOL", "HDL", "LDLCALC", "TRIG", "CHOLHDL", "LDLDIRECT" in the last 72 hours. Thyroid Function Tests: No results for input(s): "TSH", "T4TOTAL", "FREET4", "T3FREE",  "THYROIDAB" in the last 72 hours. Anemia Panel: No results for input(s): "VITAMINB12", "FOLATE", "FERRITIN", "TIBC", "IRON", "RETICCTPCT" in the last 72 hours. Sepsis Labs: Recent Labs  Lab 12/26/2021 1610 12/15/2021 1752 12/15/21 0236  PROCALCITON  --   --  <0.10  LATICACIDVEN 2.3* 2.8* 2.1*    Recent Results (from the past 240 hour(s))  SARS Coronavirus 2 by RT PCR (hospital order, performed in [facility] hospital lab) *cepheid single result test* Anterior Nasal Swab     Status: None   Collection Time: 11/29/2021  3:47 PM   Specimen: Anterior Nasal Swab  Result Value Ref Range Status   SARS Coronavirus 2 by RT PCR NEGATIVE NEGATIVE Final    Comment: (NOTE) SARS-CoV-2 target nucleic acids are NOT DETECTED.  The SARS-CoV-2 RNA is generally detectable in upper and lower respiratory specimens during the acute phase of infection. The lowest concentration of SARS-CoV-2 viral copies this assay can detect is 250 copies / mL. A negative result does not preclude SARS-CoV-2 infection and should not be used as the sole basis for treatment or other patient management decisions.  A negative result may occur with improper specimen collection / handling, submission of specimen other than nasopharyngeal swab, presence of viral mutation(s) within the areas targeted  by this assay, and inadequate number of viral copies (<250 copies / mL). A negative result must be combined with clinical observations, patient history, and epidemiological information.  Fact Sheet for Patients:   https://www.patel.info/  Fact Sheet for Healthcare Providers: https://hall.com/  This test is not yet approved or  cleared by the [place] FDA and has been authorized for detection and/or diagnosis of SARS-CoV-2 by FDA under an Emergency Use Authorization (EUA).  This EUA will remain in effect (meaning this test can be used) for the duration of the COVID-19 declaration under  Section 564(b)(1) of the Act, 21 U.S.C. section 360bbb-3(b)(1), unless the authorization is terminated or revoked sooner.  Performed at [facility], [place] [address], [place], [place] [address]   Culture, blood (Routine x 2)     Status: None (Preliminary result)   Collection Time: 12/06/2021  4:10 PM   Specimen: BLOOD  Result Value Ref Range Status   Specimen Description BLOOD BLOOD LEFT ARM  Final   Special Requests   Final    BOTTLES DRAWN AEROBIC AND ANAEROBIC Blood Culture results may not be optimal due to an excessive volume of blood received in culture bottles   Culture   Final    NO GROWTH < 24 HOURS Performed at [facility], [place] [address] [place],  [address]    Report Status PENDING  Incomplete         Radiology Studies: MR BRAIN WO CONTRAST  Result Date: 12/15/2021 CLINICAL DATA:  Initial evaluation for neuro deficit, stroke suspected. EXAM: MRI HEAD WITHOUT CONTRAST TECHNIQUE: Multiplanar, multiecho pulse sequences of the brain and surrounding structures were obtained without intravenous contrast. COMPARISON:  CT from 12/04/2021. FINDINGS: Brain: Generalized age-related cerebral atrophy. Patchy and confluent T2/FLAIR hyperintensity involving the periventricular and deep white matter both cerebral hemispheres, most consistent with chronic small vessel ischemic disease. Encephalomalacia and gliosis involving the right cerebral hemisphere, consistent with a chronic right MCA distribution infarct. Scattered areas of associated chronic hemosiderin staining present within this region. Associated wallerian degeneration/atrophy present at the right cerebral peduncle and midbrain. Additional small remote left cerebellar infarct noted. No evidence for acute or subacute ischemia. Gray-white matter differentiation otherwise maintained. No acute intracranial hemorrhage. No mass lesion or significant mass effect. Ex vacuo dilatation of the right lateral ventricle related to  the chronic right cerebral encephalomalacia. No hydrocephalus. No extra-axial fluid collection. Pituitary gland suprasellar region within normal limits. Vascular: Mildly heterogeneous flow void within the horizontal petrous right ICA, likely slow flow. This is presumably a chronic finding given the chronic right MCA territory infarct and lack of acute ischemic changes. Major intracranial vascular flow voids are otherwise maintained. Skull and upper cervical spine: Craniocervical junction within normal limits. Postsurgical changes partially visualize within the cervical spine. Bone marrow signal intensity within normal limits. No scalp soft tissue abnormality. Sinuses/Orbits: Patient status post bilateral ocular lens replacement. Left maxillary sinus retention cyst noted. Scattered mucosal thickening noted within the ethmoidal air cells and maxillary sinuses as well. No significant mastoid effusion. Other: None. IMPRESSION: 1. No acute intracranial abnormality. 2. Large remote right MCA distribution infarct, with additional small left cerebellar infarct. 3. Underlying age-related cerebral atrophy with moderate chronic microvascular ischemic disease. Electronically Signed   By: Jeannine Boga M.D.   On: 12/15/2021 03:07   CT ABDOMEN PELVIS WO CONTRAST  Result Date: 12/13/2021 CLINICAL DATA:  Acute abdominal pain EXAM: CT ABDOMEN AND PELVIS WITHOUT CONTRAST TECHNIQUE: Multidetector CT imaging of the abdomen and  pelvis was performed following the standard protocol without IV contrast. RADIATION DOSE REDUCTION: This exam was performed according to the departmental dose-optimization program which includes automated exposure control, adjustment of the mA and/or kV according to patient size and/or use of iterative reconstruction technique. COMPARISON:  11/26/2021 FINDINGS: Lower chest: Bilateral moderate pleural effusions right slightly greater than left. Some associated atelectatic changes are noted.  Hepatobiliary: Gallbladder is within normal limits. Liver again demonstrates right hepatic cyst stable from the prior study. Pancreas: Unremarkable. No pancreatic ductal dilatation or surrounding inflammatory changes. Spleen: Normal in size without focal abnormality. Adrenals/Urinary Tract: Adrenal glands are within normal limits. Left renal cyst is noted and stable. No further follow-up is recommended. No obstructive changes are seen. The bladder is decompressed. Stomach/Bowel: Findings of rectal impaction noted with wall thickening and Peri rectal inflammatory changes consistent with stercoral proctitis. Remainder of the colon appears within normal limits. Appendix is unremarkable. Small bowel and stomach are within normal limits. Vascular/Lymphatic: Aortic atherosclerosis. No enlarged abdominal or pelvic lymph nodes. Reproductive: Uterus and bilateral adnexa are unremarkable. Other: No abdominal wall hernia or abnormality. No abdominopelvic ascites. Musculoskeletal: Degenerative changes of lumbar spine are noted. No compression deformity is noted. IMPRESSION: Findings of rectal impaction with wall thickening and. Rectal inflammatory change consistent with proctitis. This has increased somewhat in the interval from the prior exam. Bilateral moderate pleural effusions with associated atelectatic changes. Hepatic and renal cysts.  No follow-up is recommended. Electronically Signed   By: Inez Catalina M.D.   On: 12/06/2021 21:54   CT Head Wo Contrast  Result Date: 12/05/2021 CLINICAL DATA:  Altered mental status EXAM: CT HEAD WITHOUT CONTRAST TECHNIQUE: Contiguous axial images were obtained from the base of the skull through the vertex without intravenous contrast. RADIATION DOSE REDUCTION: This exam was performed according to the departmental dose-optimization program which includes automated exposure control, adjustment of the mA and/or kV according to patient size and/or use of iterative reconstruction  technique. COMPARISON:  02/10/18 FINDINGS: Brain: There are changes consistent with prior right MCA infarct new from the prior CT examination. Chronic atrophic changes are noted. No acute infarct is seen. No acute hemorrhage or space-occupying mass lesion is noted. Vascular: No hyperdense vessel or unexpected calcification. Skull: Normal. Negative for fracture or focal lesion. Sinuses/Orbits: Mucosal retention cyst is noted in the left maxillary antrum. Other: None. 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 Atrophic changes without acute abnormality. Electronically Signed   By: Inez Catalina M.D.   On: 12/04/2021 21:51   DG Chest Portable 1 View  Result Date: 12/16/2021 CLINICAL DATA:  sob EXAM: PORTABLE CHEST 1 VIEW COMPARISON:  12/01/2021. FINDINGS: Similar large of the cardiac silhouette. Similar layering bilateral pleural effusions with overlying bibasilar opacities. No visible pneumothorax. Scoliosis and polyarticular degenerative change. ACDF. IMPRESSION: 1. Similar layering bilateral pleural effusion with overlying atelectasis and/or consolidation. 2. Similar cardiomegaly. Electronically Signed   By: Margaretha Sheffield M.D.   On: 12/18/2021 16:41        Scheduled Meds:  midodrine  10 mg Oral TID WC   Continuous Infusions:   LOS: 0 days    Time spent: 54mns    JKathie Dike MD [facility]   If 7PM-7AM, please contact night-coverage [email or web address]  12/15/2021, 12:03 PM

## 2021-12-15 NOTE — Plan of Care (Signed)
  Problem: Clinical Measurements: Goal: Respiratory complications will improve Outcome: Progressing   Problem: Safety: Goal: Ability to remain free from injury will improve Outcome: Progressing   

## 2021-12-15 NOTE — Consult Note (Signed)
Consultation Note Date: 12/15/2021   Patient Name: Brandy [name]  DOB: 1940-06-15  MRN: [id number]  Age / Sex: 81 y.o., female  PCP: [name], MD Referring Physician: Kathie Dike, MD  Reason for Consultation: Establishing goals of care  HPI/Patient Profile: 81 y.o. female  with past medical history of chronic combined CHF/nonischemic cardiomyopathy, paroxysmal A-fib on Eliquis, COPD on home oxygen, CVA with left hemiplegia, bed and wheelchair bound, severe protein calories malnutrition, CKD stage IIIb, hypertension admitted on 12/17/2021 with abdominal pain and changes in responsiveness.   PMT has been consulted to assist with goals of care conversation.  Clinical Assessment and Goals of Care:  I have reviewed medical records including EPIC notes, labs and imaging, assessed the patient and then met at the bedside with patient's husband to discuss diagnosis prognosis, GOC, EOL wishes, disposition and options.  I introduced Palliative Medicine as specialized medical care for people living with serious illness. It focuses on providing relief from the symptoms and stress of a serious illness. The goal is to improve quality of life for both the patient and the family.  We discussed a brief life review of the patient and then focused on their current illness.   I attempted to elicit values and goals of care important to the patient.    Medical History Review and Understanding:  Patient's acute illness was reviewed in great detail including acute heart failure, acute renal failure, extremely poor prognosis, and review of labs/imaging results per husband's request.  Gently explained to husband that there are no good treatment options and that if (when) the current last line efforts are not successful, she would likely need to transition to comfort measures to avoid prolonged suffering.  Social History: [name]  describes patient as a "people person."  They have been married for 44 years.  She is a former Pharmacist, hospital.  They are very spiritual.  Functional and Nutritional State: Patient's husband tells me she has not had very little oral intake for the past 2 weeks with nothing at all over the past 3 days.  He feels she is doing "fair" overall.  She is bedbound and wheelchair-bound.  Palliative Symptoms: Abdominal pain  Code Status: Concepts specific to code status, artifical feeding and hydration, and rehospitalization were considered and discussed.  Patient's husband confirms that after difficult conversations today, he is agreed to DNR.  Discussion: Spent majority of the time ensuring patient's husband understands the severity of patient's illness with education and counseling on her multiorgan system failure.  He has a lot of questions about the source of her abdominal pain, as this has been most disturbing to her quality of life.  He shakes his head in response to the information, but otherwise does not indicate a deeper understanding of her poor prognosis.  He seems to be very overwhelmed today.  We discussed that if patient is not an inotrope candidate, there is really no other option other than transition to comfort care to avoid prolonged suffering.  Discussed that with or without ongoing interventions, patient's body is slowly giving out and it is very likely she may not recover from this illness.  He would like to hear more about her options from cardiology and is agreeable to meeting again with me to continue discussing next steps based on her options and clinical course.   The difference between aggressive medical intervention and comfort care was considered in light of the patient's goals of care. Hospice and Palliative Care services outpatient were explained  and offered.   Discussed the importance of continued conversation with family and the medical providers regarding overall plan of care and  treatment options, ensuring decisions are within the context of the patient's values and GOCs.   Questions and concerns were addressed.  Hard Choices booklet left for review. The family was encouraged to call with questions or concerns.  PMT will continue to support holistically.  SUMMARY OF RECOMMENDATIONS   -DNR confirmed -Continue current care for now, patient's husband seems overwhelmed after decision on CODE STATUS today and is not ready for any further decisions -Ongoing GOC discussions pending clinical course -Spiritual care consult at husband's request -Psychosocial and emotional support provided -PMT will continue to follow and support  Prognosis:  Very poor long-term prognosis  Discharge Planning: To Be Determined      Primary Diagnoses: Present on Admission:  AKI (acute kidney injury) ([place])  CKD (chronic kidney disease) stage 3, GFR 30-59 ml/min (HCC)  Chronic combined systolic and diastolic CHF (congestive heart failure) (HCC)  Left hemiplegia (HCC)  Acute renal failure superimposed on stage 3 chronic kidney disease ([place])   Physical Exam Vitals and nursing note reviewed.  Constitutional:      General: She is not in acute distress.    Appearance: She is ill-appearing.  Cardiovascular:     Rate and Rhythm: Tachycardia present. Rhythm irregular.  Pulmonary:     Effort: Pulmonary effort is normal. No respiratory distress.  Skin:    General: Skin is warm and dry.  Neurological:     Mental Status: She is lethargic.    Vital Signs: BP 102/76   Pulse (!) 138   Temp 97.9 F (36.6 C) (Oral)   Resp 12   SpO2 100%        SpO2: SpO2: 100 % O2 Device:SpO2: 100 % O2 Flow Rate: .O2 Flow Rate (L/min): 2 L/min   Palliative Assessment/Data: 10%    MDM: high   Constantino Starace Johnnette Litter, PA-C  Palliative Medicine Team Team phone # [phone]  Thank you for allowing the Palliative Medicine Team to assist in the care of this patient. Please utilize secure chat  with additional questions, if there is no response within 30 minutes please call the above phone number.  Palliative Medicine Team providers are available by phone from 7am to 7pm daily and can be reached through the team cell phone.  Should this patient require assistance outside of these hours, please call the patient's attending physician.

## 2021-12-15 NOTE — Progress Notes (Signed)
Pt. HR sustaining in the 150s-160s, BP 84/60. On call for [facility] paged to make aware.

## 2021-12-15 NOTE — Consult Note (Signed)
Cardiology Consultation   Patient ID: Brandy [name] MRN: [id number]; DOB: Oct 17, 1940  Admit date: 11/30/2021 Date of Consult: 12/15/2021  PCP:  [name], MD   [place] [facility] Cardiologist:  Skeet Latch, MD      Patient Profile:   Brandy [name] is a 81 y.o. female with a hx of HFrEF, hypertension, hyperlipidemia, permanent atrial fibrillation, CVA, COPD on home O2 who is being seen 12/15/2021 for the evaluation of atrial fibrillation at the request of Dr. Roderic Palau.  History of Present Illness:   Brandy [name] is a 81 year old female with past medical history noted above.  She is a prior patient of Dr. Bronson Ing.    Underwent cardiac catheterization 05/2017 which showed minimal nonobstructive CAD with mildly elevated right heart pressures.  Echocardiogram 03/2018 showed an LVEF of 20 to 25%.  She was hospitalized with hypernatremia and found to be nonambulatory and nonverbal.  Palliative care was consulted in the setting of failure to thrive.  She was found to have acute renal failure and had a PEG tube placed.  Follow-up echocardiogram 10/2018 showed LVEF of 35 to 40%, moderate LVH, grade 2 diastolic dysfunction.  Was again hospitalized with a GI bleed in the setting of Eliquis complicated by community-acquired pneumonia with erosive gastritis and colonic polyps.  She was instructed to hold her Eliquis until 12/2019.  Again hospitalized 01/2020 with heart failure exacerbation and treated with IV Lasix.  She was discharged on furosemide 20 mg daily.  Noted that her feeding tube fell out 07/2020 when she was actually having increased p.o. intake therefore was not replaced.  Last seen by [name] 08/2021 with complaints of abdominal pain.  EKG showed sinus tachycardia with well-controlled BP in clinic.  It was recommended that she have a repeat echocardiogram.  This was done 09/23/2021 which showed LVEF of 20 to 25%, no regional wall motion abnormality, severe LVH, grade 3  diastolic dysfunction, regional strain suggestive of cardiac amyloidosis, RV function moderately reduced, normal PASP, severely dilated left atrium, small pericardial effusion with no tamponade.  Admitted again 8/28-11/28/2021 after presenting with shortness of breath and edema.  She was diuresed with IV Lasix and placed on low-dose Aldactone.  SGLT2 was deferred in the setting of her poor functional status.   It was attempted to do a PYP scan but unable to be completed due to her inability to lay flat.  Last office visit was done virtually on 12/08/2021 with Laurann Montana.  Caregiver reported her p.o. intake had been improving.  Also lower extremity edema and dyspnea had improved.  She was continued on spironolactone, Lasix, metoprolol and Eliquis 2.5 mg twice daily.  Consideration was given to collecting serum and urine protein electrophoresis with immunofixation labs, though this would to be coordinated with palliative care for home health labs.  She was brought to the ED on 9/18 with complaints of abdominal pain.  Caregiver was concerned that the patient's home O2 tank had run out of oxygen as she was having altered levels of consciousness.  On EMS arrival they were unable to obtain a blood pressure pulses initially.  While in route able to obtain an BP of 50/30.  In the ED she was noted to be in atrial fibrillation with RVR with rate of 124bpm.  She had an upward right-sided gaze.  Bedside ultrasound noted LVEF of less than 20%.  Labs in the ED showed sodium 140, potassium 4.5, creatinine 3.5, BNP 3355, high-sensitivity troponin 387>> 406, lactic acid 2.3>>2.8>>2.1,  WBC 9, hemoglobin 14.  Chest x-ray with bilateral pleural effusions.  CT abdomen pelvis showing rectal impaction with wall thickening and perirectal inflammation consistent with stercoral proctitis.  She was admitted to internal medicine for further management.  Cardiology consulted in regards to her heart failure as well as permanent atrial  fibrillation.    Past Medical History:  Diagnosis Date   Cardiomyopathy    a. EF of 25% in 07/2006 b. EF normalized by repeat echo in 2011 c. EF 35-40% by echo in 05/2017 with cath showing mild nonobstructive CAD   Chronic combined systolic and diastolic CHF (congestive heart failure) (HCC)    Chronic combined systolic and diastolic heart failure (HCC)    Colon polyps    Diverticulosis of colon    Hypertension    Hypertensive heart disease    Osteopenia    Paroxysmal atrial fibrillation ([place]) 10/10/2018   Stroke ([place])    TIA (transient ischemic attack)    Tobacco abuse    50 pack year    Past Surgical History:  Procedure Laterality Date   BREAST BIOPSY     CARPAL TUNNEL RELEASE     right hand   CERVICAL FUSION     CESAREAN SECTION     2 times   COLONOSCOPY  2951   pt uncertain as to whether polypectomy required or performed   COLONOSCOPY WITH PROPOFOL N/A 01/23/2020   Procedure: COLONOSCOPY WITH PROPOFOL;  Surgeon: Rogene Houston, MD;  [facility];  Service: Endoscopy;  Laterality: N/A;   ECTOPIC PREGNANCY SURGERY     ESOPHAGOGASTRODUODENOSCOPY (EGD) WITH PROPOFOL N/A 10/13/2018   Procedure: ESOPHAGOGASTRODUODENOSCOPY (EGD) WITH PROPOFOL;  Surgeon: Aviva Signs, MD;  Location: [facility];  Service: General;  Laterality: N/A;   ESOPHAGOGASTRODUODENOSCOPY (EGD) WITH PROPOFOL N/A 01/23/2020   Procedure: ESOPHAGOGASTRODUODENOSCOPY (EGD) WITH PROPOFOL;  Surgeon: Rogene Houston, MD;  [facility];  Service: Endoscopy;  Laterality: N/A;   IR ANGIO VERTEBRAL SEL SUBCLAVIAN INNOMINATE UNI R MOD SED  02/10/2018   IR CT HEAD LTD  02/10/2018   IR PERCUTANEOUS ART THROMBECTOMY/INFUSION INTRACRANIAL INC DIAG ANGIO  02/10/2018   PEG PLACEMENT N/A 10/13/2018   Procedure: PERCUTANEOUS ENDOSCOPIC GASTROSTOMY (PEG) PLACEMENT;  Surgeon: Aviva Signs, MD;  Location: [facility];  Service: General;  Laterality: N/A;   PEG PLACEMENT N/A 12/04/2019   Procedure: PERCUTANEOUS  ENDOSCOPIC GASTROSTOMY (PEG) REPLACEMENT;  Surgeon: Aviva Signs, MD;  [facility];  Service: Gastroenterology;  Laterality: N/A;   PEG PLACEMENT N/A 12/12/2019   Procedure: PERCUTANEOUS ENDOSCOPIC GASTROSTOMY (PEG) REPLACEMENT;  Surgeon: Aviva Signs, MD;  [facility];  Service: Gastroenterology;  Laterality: N/A;   POLYPECTOMY  01/23/2020   Procedure: POLYPECTOMY;  Surgeon: Rogene Houston, MD;  [facility];  Service: Endoscopy;;   RADIOLOGY WITH ANESTHESIA N/A 02/10/2018   Procedure: RADIOLOGY WITH ANESTHESIA;  Surgeon: Luanne Bras, MD;  Location: [facility];  Service: Radiology;  Laterality: N/A;   RIGHT/LEFT HEART CATH AND CORONARY ANGIOGRAPHY N/A 06/03/2017   Procedure: RIGHT/LEFT HEART CATH AND CORONARY ANGIOGRAPHY;  Surgeon: Jettie Booze, MD;  Location: [facility];  Service: Cardiovascular;  Laterality: N/A;   TEE WITHOUT CARDIOVERSION N/A 02/13/2018   Procedure: TRANSESOPHAGEAL ECHOCARDIOGRAM (TEE);  Surgeon: Sueanne Margarita, MD;  Location: [facility];  Service: Cardiovascular;  Laterality: N/A;   TONSILLECTOMY       Home Medications:  Prior to Admission medications   Medication Sig Start Date End Date Taking? Authorizing Provider  acetaminophen (TYLENOL) 325 MG tablet Take 2 tablets (650 mg total) by mouth every 4 (four) hours as needed for mild pain (or temp > 37.5 C (99.5 F)). Patient taking differently: Take 650 mg by mouth every 6 (six) hours as needed for mild pain or moderate pain. 03/16/18  Yes Angiulli, Lavon Paganini, PA-C  ascorbic acid (VITAMIN C) 500 MG tablet Take 500 mg by mouth daily.   Yes [provider]  atorvastatin (LIPITOR) 10 MG tablet TAKE 1 TABLET(10 MG) BY MOUTH DAILY Patient taking differently: Take 10 mg by mouth daily. 11/26/21  Yes Skeet Latch, MD  augmented betamethasone dipropionate (DIPROLENE-AF) 0.05 % ointment Apply 1 Application topically daily as needed (For rash). 11/16/21  Yes  [provider]  ELIQUIS 2.5 MG TABS tablet Take 1 tablet (2.5 mg total) by mouth 2 (two) times daily. 01/26/20  Yes Johnson, Clanford L, MD  furosemide (LASIX) 40 MG tablet Take 1 tablet (40 mg total) by mouth daily. May take additional tablet as needed up to twice per week for worsening edema, shortness of breath. 12/08/21  Yes Loel Dubonnet, NP  metoprolol succinate (TOPROL-XL) 50 MG 24 hr tablet Take 1 tablet (50 mg total) by mouth daily. Take with or immediately following a meal. 12/08/21  Yes Loel Dubonnet, NP  senna-docusate (SENOKOT-S) 8.6-50 MG tablet Take 1 tablet by mouth at bedtime. 12/01/21  Yes Isla Pence, MD  spironolactone (ALDACTONE) 25 MG tablet Take 0.5 tablets (12.5 mg total) by mouth daily. 12/08/21  Yes Loel Dubonnet, NP  cetirizine (ZYRTEC) 10 MG tablet Take 10 mg by mouth daily as needed for allergies. Patient not taking: Reported on 12/06/2021    [provider]  citalopram (CELEXA) 10 MG tablet Take 10 mg by mouth daily.    [provider]  dicyclomine (BENTYL) 10 MG capsule Take 1 capsule (10 mg total) by mouth 2 (two) times daily as needed for up to 10 days for spasms (for abd pain, cramps). 11/28/21 12/08/21  Domenic Polite, MD  pantoprazole (PROTONIX) 40 MG tablet Take 1 tablet (40 mg total) by mouth daily before breakfast. 01/25/20   Murlean Iba, MD    Inpatient Medications: Scheduled Meds:  apixaban  2.5 mg Oral BID   atorvastatin  10 mg Oral Daily   citalopram  10 mg Oral Daily   midodrine  10 mg Oral TID WC   pantoprazole  40 mg Oral Daily   Continuous Infusions:  PRN Meds: acetaminophen, levalbuterol, ondansetron (ZOFRAN) IV  Allergies:    Allergies  Allergen Reactions   Lisinopril Swelling   Jardiance [Empagliflozin] Rash    Social History:   Social History   Socioeconomic History   Marital status: Married    Spouse name: Not on file   Number of children: Not on file   Years of education: Not on  file   Highest education level: Not on file  Occupational History   Occupation: Former Product manager: RETIRED  Tobacco Use   Smoking status: Former    Packs/day: 1.00    Years: 50.00    Total pack years: 50.00    Types: Cigarettes    Start date: 09/02/1960    Quit date: 03/29/2008    Years since quitting: 13.7   Smokeless tobacco: Never  Vaping Use   Vaping Use: Never used  Substance and Sexual Activity   Alcohol use: No    Alcohol/week: 0.0 standard drinks of alcohol   Drug use: Never  Sexual activity: Not Currently  Other Topics Concern   Not on file  Social History Narrative   Married with 2 adult children, resides with spouse    Social Determinants of Radio broadcast assistant Strain: Not on file  Food Insecurity: Not on file  Transportation Needs: Not on file  Physical Activity: Not on file  Stress: Not on file  Social Connections: Not on file  Intimate Partner Violence: Not on file    Family History:    Family History  Problem Relation Age of Onset   Hypertension Mother    Hypertension Father    Hypertension Brother    Hypertension Maternal Grandmother    Stroke Maternal Grandmother    Cancer Paternal Grandmother    Colon cancer Neg Hx      ROS:  Please see the history of present illness.   All other ROS reviewed and negative.     Physical Exam/Data:   Vitals:   12/15/21 1245 12/15/21 1300 12/15/21 1315 12/15/21 1505  BP: (!) 92/40 (!) 82/64 99/82 102/76  Pulse: (!) 57  (!) 138   Resp: 18 17 (!) 23 12  Temp:      TempSrc:      SpO2: 98%  100%    No intake or output data in the 24 hours ending 12/15/21 1514    12/01/2021    4:57 AM 11/28/2021    4:14 AM 11/27/2021    5:32 AM  Last 3 Weights  Weight (lbs) 130 lb 11.7 oz 130 lb 11.7 oz 133 lb 6.1 oz  Weight (kg) 59.3 kg 59.3 kg 60.5 kg     There is no height or weight on file to calculate BMI.  General: Very frail older female, sitting up in bed. Maxwell '@2L'$ , no vocal HEENT: normal Neck: no  JVD Vascular: No carotid bruits; Distal pulses 2+ bilaterally Cardiac:  normal S1, S2; Irreg Irreg; no murmur  Lungs:  diminished in bases Abd: soft, nontender, no hepatomegaly  Ext: no edema Musculoskeletal:  No deformities, left hemiplegia Skin: warm and dry   EKG:  The EKG was personally reviewed and demonstrates:  Atrial Fibrillation RVR, 142 bpm  Telemetry:  Telemetry was personally reviewed and demonstrates:  atrial fib rates 100-130s  Relevant CV Studies:  Echo: 08/2021  IMPRESSIONS     1. Left ventricular ejection fraction, by estimation, is 20 to 25%. The  left ventricle has severely decreased function. The left ventricle has no  regional wall motion abnormalities. There is severe concentric left  ventricular hypertrophy. Left  ventricular diastolic parameters are consistent with Grade III diastolic  dysfunction (restrictive). Global longitudinal strain pattern abnormal at  -3.7%. Regional strain pattern suggestive of cardiac amyloidosis (relative  apical sparing)   2. Right ventricular systolic function is moderately reduced. The right  ventricular size is normal. Moderately increased right ventricular wall  thickness. There is normal pulmonary artery systolic pressure.   3. Left atrial size was severely dilated.   4. A small pericardial effusion is present. The pericardial effusion is  circumferential. There is no evidence of cardiac tamponade.   5. The mitral valve is abnormal. Mild mitral valve regurgitation. No  evidence of mitral stenosis.   6. The aortic valve is tricuspid. Aortic valve regurgitation is not  visualized. Aortic valve sclerosis is present, with no evidence of aortic  valve stenosis.   7. The inferior vena cava is normal in size with greater than 50%  respiratory variability, suggesting right atrial pressure of  3 mmHg.   Comparison(s): Changes from prior study are noted. LVEF has worsened  Pericardial effusion is new.    Conclusion(s)/Recommendation(s): There is significant LV hypertrophy, RV  hypertrophy, left atrial enlargement, pericardial effusion and strain  cherry red spot. Consider CMR or PYP scan for assessment of cardiac  amyloidosis.   FINDINGS   Left Ventricle: Left ventricular ejection fraction, by estimation, is 20  to 25%. The left ventricle has severely decreased function. The left  ventricle has no regional wall motion abnormalities. The left ventricular  internal cavity size was normal in  size. There is severe concentric left ventricular hypertrophy. Left  ventricular diastolic parameters are consistent with Grade III diastolic  dysfunction (restrictive).   Right Ventricle: The right ventricular size is normal. Moderately  increased right ventricular wall thickness. Right ventricular systolic  function is moderately reduced. There is normal pulmonary artery systolic  pressure. The tricuspid regurgitant  velocity is 2.07 m/s, and with an assumed right atrial pressure of 3 mmHg,  the estimated right ventricular systolic pressure is 40.9 mmHg.   Left Atrium: Left atrial size was severely dilated.   Right Atrium: Right atrial size was normal in size.   Pericardium: A small pericardial effusion is present. The pericardial  effusion is circumferential. There is no evidence of cardiac tamponade.   Mitral Valve: The mitral valve is abnormal. Mild mitral valve  regurgitation. No evidence of mitral valve stenosis.   Tricuspid Valve: The tricuspid valve is normal in structure. Tricuspid  valve regurgitation is mild . No evidence of tricuspid stenosis.   Aortic Valve: The aortic valve is tricuspid. Aortic valve regurgitation is  not visualized. Aortic valve sclerosis is present, with no evidence of  aortic valve stenosis.   Pulmonic Valve: The pulmonic valve was not well visualized. Pulmonic valve  regurgitation is trivial. No evidence of pulmonic stenosis.   Aorta: The aortic root  and ascending aorta are structurally normal, with  no evidence of dilitation.   Venous: The inferior vena cava is normal in size with greater than 50%  respiratory variability, suggesting right atrial pressure of 3 mmHg.   IAS/Shunts: There is right bowing of the interatrial septum, suggestive of  elevated left atrial pressure. No atrial level shunt detected by color  flow Doppler.   Laboratory Data:  High Sensitivity Troponin:   Recent Labs  Lab 11/23/21 2252 12/01/21 0701 12/01/21 0928 12/23/2021 1610 12/02/2021 1746  TROPONINIHS 364* 235* 234* 387* 406*     Chemistry Recent Labs  Lab 11/30/2021 1610 12/15/21 0236  NA 140 140  K 4.5 5.3*  CL 105 104  CO2 18* 20*  GLUCOSE 98 88  BUN 79* 82*  CREATININE 3.55* 3.70*  CALCIUM 9.6 9.5  MG 2.1  --   GFRNONAA 13* 12*  ANIONGAP 17* 16*    Recent Labs  Lab 12/01/2021 1610  PROT 6.2*  ALBUMIN 3.1*  AST 23  ALT 14  ALKPHOS 57  BILITOT 1.3*   Lipids No results for input(s): "CHOL", "TRIG", "HDL", "LABVLDL", "LDLCALC", "CHOLHDL" in the last 168 hours.  Hematology Recent Labs  Lab 12/01/2021 1610 12/15/21 0236  WBC 9.0 12.8*  RBC 4.80 4.62  HGB 14.0 13.4  HCT 44.9 44.6  MCV 93.5 96.5  MCH 29.2 29.0  MCHC 31.2 30.0  RDW 16.6* 16.7*  PLT 243 228   Thyroid No results for input(s): "TSH", "FREET4" in the last 168 hours.  BNP Recent Labs  Lab 12/08/2021 1610  BNP 3,355.5*  DDimer No results for input(s): "DDIMER" in the last 168 hours.   Radiology/Studies:  MR BRAIN WO CONTRAST  Result Date: 12/15/2021 CLINICAL DATA:  Initial evaluation for neuro deficit, stroke suspected. EXAM: MRI HEAD WITHOUT CONTRAST TECHNIQUE: Multiplanar, multiecho pulse sequences of the brain and surrounding structures were obtained without intravenous contrast. COMPARISON:  CT from 12/06/2021. FINDINGS: Brain: Generalized age-related cerebral atrophy. Patchy and confluent T2/FLAIR hyperintensity involving the periventricular and deep white  matter both cerebral hemispheres, most consistent with chronic small vessel ischemic disease. Encephalomalacia and gliosis involving the right cerebral hemisphere, consistent with a chronic right MCA distribution infarct. Scattered areas of associated chronic hemosiderin staining present within this region. Associated wallerian degeneration/atrophy present at the right cerebral peduncle and midbrain. Additional small remote left cerebellar infarct noted. No evidence for acute or subacute ischemia. Gray-white matter differentiation otherwise maintained. No acute intracranial hemorrhage. No mass lesion or significant mass effect. Ex vacuo dilatation of the right lateral ventricle related to the chronic right cerebral encephalomalacia. No hydrocephalus. No extra-axial fluid collection. Pituitary gland suprasellar region within normal limits. Vascular: Mildly heterogeneous flow void within the horizontal petrous right ICA, likely slow flow. This is presumably a chronic finding given the chronic right MCA territory infarct and lack of acute ischemic changes. Major intracranial vascular flow voids are otherwise maintained. Skull and upper cervical spine: Craniocervical junction within normal limits. Postsurgical changes partially visualize within the cervical spine. Bone marrow signal intensity within normal limits. No scalp soft tissue abnormality. Sinuses/Orbits: Patient status post bilateral ocular lens replacement. Left maxillary sinus retention cyst noted. Scattered mucosal thickening noted within the ethmoidal air cells and maxillary sinuses as well. No significant mastoid effusion. Other: None. IMPRESSION: 1. No acute intracranial abnormality. 2. Large remote right MCA distribution infarct, with additional small left cerebellar infarct. 3. Underlying age-related cerebral atrophy with moderate chronic microvascular ischemic disease. Electronically Signed   By: Jeannine Boga M.D.   On: 12/15/2021 03:07   CT  ABDOMEN PELVIS WO CONTRAST  Result Date: 12/04/2021 CLINICAL DATA:  Acute abdominal pain EXAM: CT ABDOMEN AND PELVIS WITHOUT CONTRAST TECHNIQUE: Multidetector CT imaging of the abdomen and pelvis was performed following the standard protocol without IV contrast. RADIATION DOSE REDUCTION: This exam was performed according to the departmental dose-optimization program which includes automated exposure control, adjustment of the mA and/or kV according to patient size and/or use of iterative reconstruction technique. COMPARISON:  11/26/2021 FINDINGS: Lower chest: Bilateral moderate pleural effusions right slightly greater than left. Some associated atelectatic changes are noted. Hepatobiliary: Gallbladder is within normal limits. Liver again demonstrates right hepatic cyst stable from the prior study. Pancreas: Unremarkable. No pancreatic ductal dilatation or surrounding inflammatory changes. Spleen: Normal in size without focal abnormality. Adrenals/Urinary Tract: Adrenal glands are within normal limits. Left renal cyst is noted and stable. No further follow-up is recommended. No obstructive changes are seen. The bladder is decompressed. Stomach/Bowel: Findings of rectal impaction noted with wall thickening and Peri rectal inflammatory changes consistent with stercoral proctitis. Remainder of the colon appears within normal limits. Appendix is unremarkable. Small bowel and stomach are within normal limits. Vascular/Lymphatic: Aortic atherosclerosis. No enlarged abdominal or pelvic lymph nodes. Reproductive: Uterus and bilateral adnexa are unremarkable. Other: No abdominal wall hernia or abnormality. No abdominopelvic ascites. Musculoskeletal: Degenerative changes of lumbar spine are noted. No compression deformity is noted. IMPRESSION: Findings of rectal impaction with wall thickening and. Rectal inflammatory change consistent with proctitis. This has increased somewhat in the interval from the prior exam. Bilateral  moderate pleural effusions with associated atelectatic changes. Hepatic and renal cysts.  No follow-up is recommended. Electronically Signed   By: Inez Catalina M.D.   On: 12/05/2021 21:54   CT Head Wo Contrast  Result Date: 12/19/2021 CLINICAL DATA:  Altered mental status EXAM: CT HEAD WITHOUT CONTRAST TECHNIQUE: Contiguous axial images were obtained from the base of the skull through the vertex without intravenous contrast. RADIATION DOSE REDUCTION: This exam was performed according to the departmental dose-optimization program which includes automated exposure control, adjustment of the mA and/or kV according to patient size and/or use of iterative reconstruction technique. COMPARISON:  02/10/18 FINDINGS: Brain: There are changes consistent with prior right MCA infarct new from the prior CT examination. Chronic atrophic changes are noted. No acute infarct is seen. No acute hemorrhage or space-occupying mass lesion is noted. Vascular: No hyperdense vessel or unexpected calcification. Skull: Normal. Negative for fracture or focal lesion. Sinuses/Orbits: Mucosal retention cyst is noted in the left maxillary antrum. Other: None. 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 Atrophic changes without acute abnormality. Electronically Signed   By: Inez Catalina M.D.   On: 11/27/2021 21:51   DG Chest Portable 1 View  Result Date: 12/13/2021 CLINICAL DATA:  sob EXAM: PORTABLE CHEST 1 VIEW COMPARISON:  12/01/2021. FINDINGS: Similar large of the cardiac silhouette. Similar layering bilateral pleural effusions with overlying bibasilar opacities. No visible pneumothorax. Scoliosis and polyarticular degenerative change. ACDF. IMPRESSION: 1. Similar layering bilateral pleural effusion with overlying atelectasis and/or consolidation. 2. Similar cardiomegaly. Electronically Signed   By: Margaretha Sheffield M.D.   On: 12/19/2021 16:41     Assessment and Plan:    Brandy [name] is a 81 y.o. female with a hx of HFrEF, hypertension, hyperlipidemia, atrial fibrillation, CVA, COPD on home O2 who is being seen 12/15/2021 for the evaluation of atrial fibrillation at the request of Dr. Roderic Palau.  HFrEF NICM -- known LVEF of 20-25%, G3DD. Has been on limited GDMT as an outpatient with lasix, spiro and metoprolol. CXR with bilateral pleural effusions, BNP 3300. Blood pressures are soft, unable to advance therapies. BB held -- will attempt IV lasix '80mg'$  x1, follow response -- at this time, do not felt she is a candidate for advanced therapies given her advanced age, renal disease and poor functional status. Agree with palliative care consult   Permanent atrial fibrillation -- Known history of atrial fibrillation has been on Eliquis 2.5 mg twice daily historically -- rates currently reasonably controlled in the 100-120 range, BB held with soft BPs -- given she is chronically in afib, plan for rate control.  -- Eliquis ordered, but she is pending a swallow evaluation  AKI CKD stage IIIb -- baseline Cr 1.5-1.6. Now up at 3.5>>3.7 on admission. Could be cardiorenal syndrome -- follow BMET  Elevated Troponin -- hsTn 387>>406 -- suspect demand ischemia in the setting of CHF -- not a candidate for invasive work up, continue medical therapy  Chronic hypoxic respiratory failure COPD -- on 3-4L [place] at home, currently sats 100% on 2L while in the room -- per primary  Hx of CVA Left Hemiplegia Poor functional status Goals of care -- palliative consulted -- patient now a DNR per primary discussion with pt/family  Risk Assessment/Risk Scores:   New York Heart Association (NYHA) Functional Class NYHA Class II  CHA2DS2-VASc Score = 7   This indicates a 11.2% annual risk of stroke. The patient's score is based upon: CHF History: 1  HTN History: 1 Diabetes History: 0 Stroke History: 2 Vascular Disease History: 0 Age Score: 2 Gender Score: 1   For  questions or updates, please contact [place] Please consult [email or web address] for contact info under    Signed, Reino Bellis, NP  12/15/2021 3:14 PM

## 2021-12-16 DIAGNOSIS — I4891 Unspecified atrial fibrillation: Secondary | ICD-10-CM | POA: Diagnosis not present

## 2021-12-16 DIAGNOSIS — N170 Acute kidney failure with tubular necrosis: Secondary | ICD-10-CM | POA: Diagnosis not present

## 2021-12-16 DIAGNOSIS — I6930 Unspecified sequelae of cerebral infarction: Secondary | ICD-10-CM | POA: Diagnosis not present

## 2021-12-16 DIAGNOSIS — Z8673 Personal history of transient ischemic attack (TIA), and cerebral infarction without residual deficits: Secondary | ICD-10-CM

## 2021-12-16 DIAGNOSIS — I5042 Chronic combined systolic (congestive) and diastolic (congestive) heart failure: Secondary | ICD-10-CM | POA: Diagnosis not present

## 2021-12-16 DIAGNOSIS — G8194 Hemiplegia, unspecified affecting left nondominant side: Secondary | ICD-10-CM

## 2021-12-16 DIAGNOSIS — I480 Paroxysmal atrial fibrillation: Secondary | ICD-10-CM

## 2021-12-16 DIAGNOSIS — R7989 Other specified abnormal findings of blood chemistry: Secondary | ICD-10-CM

## 2021-12-16 DIAGNOSIS — I5023 Acute on chronic systolic (congestive) heart failure: Secondary | ICD-10-CM | POA: Diagnosis not present

## 2021-12-16 MED ORDER — FENTANYL CITRATE PF 50 MCG/ML IJ SOSY
25.0000 ug | PREFILLED_SYRINGE | INTRAMUSCULAR | Status: AC | PRN
  Administered 2021-12-16 – 2021-12-17 (×4): 50 ug via INTRAVENOUS
  Filled 2021-12-16 (×4): qty 1

## 2021-12-16 NOTE — IPAL (Signed)
I spoke with patient's daughter about patient's current condition, multiorgan failure and high risk of worsening.  All questions were addressed.  Plan to continue supportive care for now, and if no significant improvement or stabilization, I will recommend transition to comfort care and continue with palliative care. Will continue to update her family in order to take next steps in her care.

## 2021-12-16 NOTE — Assessment & Plan Note (Addendum)
COPD with no acute exacerbation.  Continue with supplemental 02 for comfort.  Patient with poor prognosis and rapidly declining.

## 2021-12-16 NOTE — Progress Notes (Signed)
DAILY PROGRESS NOTE   Patient Name: Brandy [name] Date of Encounter: 12/16/2021 Cardiologist: Skeet Latch, MD  Chief Complaint   None  Patient Profile   Brandy [name] is a 81 y.o. female with a hx of HFrEF, hypertension, hyperlipidemia, permanent atrial fibrillation, CVA, COPD on home O2 who is being seen 12/15/2021 for the evaluation of atrial fibrillation at the request of Dr. Roderic Palau.   Subjective   No recorded output overnight with lasix - creatinine continues to worsen (3.7) - GFR 12. Not considered a dialysis candidate. Options are very limited. Palliative evaluated - she is DNR, but not yet comfort.  Objective   Vitals:   12/15/21 2300 12/16/21 0400 12/16/21 0500 12/16/21 0600  BP: (S) (!) 84/70 (!) 86/78 (!) 89/71 (!) 81/59  Pulse:    (!) 119  Resp: 14   17  Temp:      TempSrc:    Oral  SpO2:      Weight:    56 kg  Height:        Intake/Output Summary (Last 24 hours) at 12/16/2021 1011 Last data filed at 12/15/2021 2200 Gross per 24 hour  Intake 0 ml  Output --  Net 0 ml   Filed Weights   12/15/21 1656 12/16/21 0600  Weight: 58.3 kg 56 kg    Physical Exam   General appearance: alert and no distress Lungs: diminished breath sounds bibasilar Heart: irregularly irregular rhythm and tachycardic Abdomen: soft, non-tender; bowel sounds normal; no masses,  no organomegaly Pulses: 2+ and symmetric Neurologic: Mental status: alert, but confused, not oriented  Inpatient Medications    Scheduled Meds:  apixaban  2.5 mg Oral BID   atorvastatin  10 mg Oral Daily   citalopram  10 mg Oral Daily   furosemide  80 mg Intravenous Once   midodrine  10 mg Oral TID WC   pantoprazole  40 mg Oral Daily    Continuous Infusions:   PRN Meds: acetaminophen, levalbuterol, ondansetron (ZOFRAN) IV   Labs   Results for orders placed or performed during the hospital encounter of 12/23/2021 (from the past 48 hour(s))  SARS Coronavirus 2 by RT PCR (hospital  order, performed in [facility] hospital lab) *cepheid single result test* Anterior Nasal Swab     Status: None   Collection Time: 12/13/2021  3:47 PM   Specimen: Anterior Nasal Swab  Result Value Ref Range   SARS Coronavirus 2 by RT PCR NEGATIVE NEGATIVE    Comment: (NOTE) SARS-CoV-2 target nucleic acids are NOT DETECTED.  The SARS-CoV-2 RNA is generally detectable in upper and lower respiratory specimens during the acute phase of infection. The lowest concentration of SARS-CoV-2 viral copies this assay can detect is 250 copies / mL. A negative result does not preclude SARS-CoV-2 infection and should not be used as the sole basis for treatment or other patient management decisions.  A negative result may occur with improper specimen collection / handling, submission of specimen other than nasopharyngeal swab, presence of viral mutation(s) within the areas targeted by this assay, and inadequate number of viral copies (<250 copies / mL). A negative result must be combined with clinical observations, patient history, and epidemiological information.  Fact Sheet for Patients:   https://www.patel.info/  Fact Sheet for Healthcare Providers: https://hall.com/  This test is not yet approved or  cleared by the [place] FDA and has been authorized for detection and/or diagnosis of SARS-CoV-2 by FDA under an Emergency Use Authorization (EUA).  This  EUA will remain in effect (meaning this test can be used) for the duration of the COVID-19 declaration under Section 564(b)(1) of the Act, 21 U.S.C. section 360bbb-3(b)(1), unless the authorization is terminated or revoked sooner.  Performed at [facility], [place] [address], [place], [place] [address]   CBC with Differential     Status: Abnormal   Collection Time: 12/15/2021  4:10 PM  Result Value Ref Range   WBC 9.0 4.0 - 10.5 K/uL   RBC 4.80 3.87 - 5.11 MIL/uL   Hemoglobin 14.0 12.0 - 15.0  g/dL   HCT 44.9 36.0 - 46.0 %   MCV 93.5 80.0 - 100.0 fL   MCH 29.2 26.0 - 34.0 pg   MCHC 31.2 30.0 - 36.0 g/dL   RDW 16.6 (H) 11.5 - 15.5 %   Platelets 243 150 - 400 K/uL   nRBC 0.0 0.0 - 0.2 %   Neutrophils Relative % 80 %   Neutro Abs 7.1 1.7 - 7.7 K/uL   Lymphocytes Relative 15 %   Lymphs Abs 1.4 0.7 - 4.0 K/uL   Monocytes Relative 5 %   Monocytes Absolute 0.4 0.1 - 1.0 K/uL   Eosinophils Relative 0 %   Eosinophils Absolute 0.0 0.0 - 0.5 K/uL   Basophils Relative 0 %   Basophils Absolute 0.0 0.0 - 0.1 K/uL   Immature Granulocytes 0 %   Abs Immature Granulocytes 0.03 0.00 - 0.07 K/uL    Comment: Performed at [facility] [address], [place], [place] [address]  Comprehensive metabolic panel     Status: Abnormal   Collection Time: 11/27/2021  4:10 PM  Result Value Ref Range   Sodium 140 135 - 145 mmol/L   Potassium 4.5 3.5 - 5.1 mmol/L   Chloride 105 98 - 111 mmol/L   CO2 18 (L) 22 - 32 mmol/L   Glucose, Bld 98 70 - 99 mg/dL    Comment: Glucose reference range applies only to samples taken after fasting for at least 8 hours.   BUN 79 (H) 8 - 23 mg/dL   Creatinine, Ser 3.55 (H) 0.44 - 1.00 mg/dL   Calcium 9.6 8.9 - 10.3 mg/dL   Total Protein 6.2 (L) 6.5 - 8.1 g/dL   Albumin 3.1 (L) 3.5 - 5.0 g/dL   AST 23 15 - 41 U/L   ALT 14 0 - 44 U/L   Alkaline Phosphatase 57 38 - 126 U/L   Total Bilirubin 1.3 (H) 0.3 - 1.2 mg/dL   GFR, Estimated 13 (L) >60 mL/min    Comment: (NOTE) Calculated using the CKD-EPI Creatinine Equation (2021)    Anion gap 17 (H) 5 - 15    Comment: Performed at [facility], [place] [address] [place], [place] [address]  Magnesium     Status: None   Collection Time: 12/26/2021  4:10 PM  Result Value Ref Range   Magnesium 2.1 1.7 - 2.4 mg/dL    Comment: Performed at [facility] [address], [place], [place] [address]  Troponin I (High Sensitivity)     Status: Abnormal   Collection Time: 12/26/2021  4:10 PM  Result Value Ref Range    Troponin I (High Sensitivity) 387 (HH) <18 ng/L    Comment: CRITICAL RESULT CALLED TO, READ BACK BY AND VERIFIED WITH A. BRITT RN 12/13/2021 '@1727'$  BY J. WHITE (NOTE) Elevated high sensitivity troponin I (hsTnI) values and significant  changes across serial measurements may suggest ACS but many  other  chronic and acute conditions are known to elevate hsTnI results.  Refer to the "Links" section for chest pain algorithms and additional  guidance. Performed at [facility], [place] [address] [place], [place] [address]   Brain natriuretic peptide     Status: Abnormal   Collection Time: 12/16/2021  4:10 PM  Result Value Ref Range   B Natriuretic Peptide 3,355.5 (H) 0.0 - 100.0 pg/mL    Comment: Performed at [facility] [address], [place], [place] [address]  Ammonia     Status: None   Collection Time: 12/05/2021  4:10 PM  Result Value Ref Range   Ammonia 24 9 - 35 umol/L    Comment: Performed at [facility], [place] [address] [place], [place] [address]  Lactic acid, plasma     Status: Abnormal   Collection Time: 12/11/2021  4:10 PM  Result Value Ref Range   Lactic Acid, Venous 2.3 (HH) 0.5 - 1.9 mmol/L    Comment: CRITICAL RESULT CALLED TO, READ BACK BY AND VERIFIED WITH A. BRITT RN 12/01/2021 '@1727'$  BY J. WHITE Performed at [facility] [address], [place], [place] [address]   Protime-INR     Status: Abnormal   Collection Time: 12/15/2021  4:10 PM  Result Value Ref Range   Prothrombin Time 30.7 (H) 11.4 - 15.2 seconds   INR 3.0 (H) 0.8 - 1.2    Comment: (NOTE) INR goal varies based on device and disease states. Performed at [facility], [place] [address], [place], [place] [address]   Culture, blood (Routine x 2)     Status: None (Preliminary result)   Collection Time: 12/18/2021  4:10 PM   Specimen: BLOOD  Result Value Ref Range   Specimen Description BLOOD BLOOD LEFT ARM    Special Requests      BOTTLES DRAWN AEROBIC AND ANAEROBIC Blood Culture results may  not be optimal due to an excessive volume of blood received in culture bottles   Culture      NO GROWTH 2 DAYS Performed at [facility], [place] [address] [place], [place] [address]    Report Status PENDING   Troponin I (High Sensitivity)     Status: Abnormal   Collection Time: 11/28/2021  5:46 PM  Result Value Ref Range   Troponin I (High Sensitivity) 406 (HH) <18 ng/L    Comment: CRITICAL VALUE NOTED. VALUE IS CONSISTENT WITH PREVIOUSLY REPORTED/CALLED VALUE (NOTE) Elevated high sensitivity troponin I (hsTnI) values and significant  changes across serial measurements may suggest ACS but many other  chronic and acute conditions are known to elevate hsTnI results.  Refer to the "Links" section for chest pain algorithms and additional  guidance. Performed at [facility], [place] [address] [place], [place] [address]   Lactic acid, plasma     Status: Abnormal   Collection Time: 12/13/2021  5:52 PM  Result Value Ref Range   Lactic Acid, Venous 2.8 (HH) 0.5 - 1.9 mmol/L    Comment: CRITICAL VALUE NOTED. VALUE IS CONSISTENT WITH PREVIOUSLY REPORTED/CALLED VALUE Performed at [facility], [place] [address] [place], [place] [address]   Basic metabolic panel     Status: Abnormal   Collection Time: 12/15/21  2:36 AM  Result Value Ref Range   Sodium 140 135 - 145 mmol/L   Potassium 5.3 (H) 3.5 - 5.1 mmol/L   Chloride 104 98 - 111 mmol/L   CO2  20 (L) 22 - 32 mmol/L   Glucose, Bld 88 70 - 99 mg/dL    Comment: Glucose reference range applies only to samples taken after fasting for at least 8 hours.   BUN 82 (H) 8 - 23 mg/dL   Creatinine, Ser 3.70 (H) 0.44 - 1.00 mg/dL   Calcium 9.5 8.9 - 10.3 mg/dL   GFR, Estimated 12 (L) >60 mL/min    Comment: (NOTE) Calculated using the CKD-EPI Creatinine Equation (2021)    Anion gap 16 (H) 5 - 15    Comment: Performed at [facility] [address] [place], [place] [address]  CBC     Status: Abnormal   Collection Time: 12/15/21   2:36 AM  Result Value Ref Range   WBC 12.8 (H) 4.0 - 10.5 K/uL   RBC 4.62 3.87 - 5.11 MIL/uL   Hemoglobin 13.4 12.0 - 15.0 g/dL   HCT 44.6 36.0 - 46.0 %   MCV 96.5 80.0 - 100.0 fL   MCH 29.0 26.0 - 34.0 pg   MCHC 30.0 30.0 - 36.0 g/dL   RDW 16.7 (H) 11.5 - 15.5 %   Platelets 228 150 - 400 K/uL   nRBC 0.0 0.0 - 0.2 %    Comment: Performed at [facility], [place] [address], [place], [place] [address]  Procalcitonin - Baseline     Status: None   Collection Time: 12/15/21  2:36 AM  Result Value Ref Range   Procalcitonin <0.10 ng/mL    Comment: Performed at [facility] [address], [place], [place] [address]  Lactic acid, plasma     Status: Abnormal   Collection Time: 12/15/21  2:36 AM  Result Value Ref Range   Lactic Acid, Venous 2.1 (HH) 0.5 - 1.9 mmol/L    Comment: CRITICAL VALUE NOTED. VALUE IS CONSISTENT WITH PREVIOUSLY REPORTED/CALLED VALUE Performed at [facility], [place] [address] [place], [place] [address]     ECG   N/A  Telemetry   Afib with RVR - Personally Reviewed  Radiology    MR BRAIN WO CONTRAST  Result Date: 12/15/2021 CLINICAL DATA:  Initial evaluation for neuro deficit, stroke suspected. EXAM: MRI HEAD WITHOUT CONTRAST TECHNIQUE: Multiplanar, multiecho pulse sequences of the brain and surrounding structures were obtained without intravenous contrast. COMPARISON:  CT from 12/16/2021. FINDINGS: Brain: Generalized age-related cerebral atrophy. Patchy and confluent T2/FLAIR hyperintensity involving the periventricular and deep white matter both cerebral hemispheres, most consistent with chronic small vessel ischemic disease. Encephalomalacia and gliosis involving the right cerebral hemisphere, consistent with a chronic right MCA distribution infarct. Scattered areas of associated chronic hemosiderin staining present within this region. Associated wallerian degeneration/atrophy present at the right cerebral peduncle and midbrain. Additional small  remote left cerebellar infarct noted. No evidence for acute or subacute ischemia. Gray-white matter differentiation otherwise maintained. No acute intracranial hemorrhage. No mass lesion or significant mass effect. Ex vacuo dilatation of the right lateral ventricle related to the chronic right cerebral encephalomalacia. No hydrocephalus. No extra-axial fluid collection. Pituitary gland suprasellar region within normal limits. Vascular: Mildly heterogeneous flow void within the horizontal petrous right ICA, likely slow flow. This is presumably a chronic finding given the chronic right MCA territory infarct and lack of acute ischemic changes. Major intracranial vascular flow voids are otherwise maintained. Skull and upper cervical spine: Craniocervical junction within normal limits. Postsurgical changes partially visualize within the cervical spine. Bone marrow signal intensity within normal limits. No scalp soft tissue abnormality. Sinuses/Orbits:  Patient status post bilateral ocular lens replacement. Left maxillary sinus retention cyst noted. Scattered mucosal thickening noted within the ethmoidal air cells and maxillary sinuses as well. No significant mastoid effusion. Other: None. IMPRESSION: 1. No acute intracranial abnormality. 2. Large remote right MCA distribution infarct, with additional small left cerebellar infarct. 3. Underlying age-related cerebral atrophy with moderate chronic microvascular ischemic disease. Electronically Signed   By: Jeannine Boga M.D.   On: 12/15/2021 03:07   CT ABDOMEN PELVIS WO CONTRAST  Result Date: 12/08/2021 CLINICAL DATA:  Acute abdominal pain EXAM: CT ABDOMEN AND PELVIS WITHOUT CONTRAST TECHNIQUE: Multidetector CT imaging of the abdomen and pelvis was performed following the standard protocol without IV contrast. RADIATION DOSE REDUCTION: This exam was performed according to the departmental dose-optimization program which includes automated exposure control,  adjustment of the mA and/or kV according to patient size and/or use of iterative reconstruction technique. COMPARISON:  11/26/2021 FINDINGS: Lower chest: Bilateral moderate pleural effusions right slightly greater than left. Some associated atelectatic changes are noted. Hepatobiliary: Gallbladder is within normal limits. Liver again demonstrates right hepatic cyst stable from the prior study. Pancreas: Unremarkable. No pancreatic ductal dilatation or surrounding inflammatory changes. Spleen: Normal in size without focal abnormality. Adrenals/Urinary Tract: Adrenal glands are within normal limits. Left renal cyst is noted and stable. No further follow-up is recommended. No obstructive changes are seen. The bladder is decompressed. Stomach/Bowel: Findings of rectal impaction noted with wall thickening and Peri rectal inflammatory changes consistent with stercoral proctitis. Remainder of the colon appears within normal limits. Appendix is unremarkable. Small bowel and stomach are within normal limits. Vascular/Lymphatic: Aortic atherosclerosis. No enlarged abdominal or pelvic lymph nodes. Reproductive: Uterus and bilateral adnexa are unremarkable. Other: No abdominal wall hernia or abnormality. No abdominopelvic ascites. Musculoskeletal: Degenerative changes of lumbar spine are noted. No compression deformity is noted. IMPRESSION: Findings of rectal impaction with wall thickening and. Rectal inflammatory change consistent with proctitis. This has increased somewhat in the interval from the prior exam. Bilateral moderate pleural effusions with associated atelectatic changes. Hepatic and renal cysts.  No follow-up is recommended. Electronically Signed   By: Inez Catalina M.D.   On: 12/13/2021 21:54   CT Head Wo Contrast  Result Date: 12/05/2021 CLINICAL DATA:  Altered mental status EXAM: CT HEAD WITHOUT CONTRAST TECHNIQUE: Contiguous axial images were obtained from the base of the skull through the vertex without  intravenous contrast. RADIATION DOSE REDUCTION: This exam was performed according to the departmental dose-optimization program which includes automated exposure control, adjustment of the mA and/or kV according to patient size and/or use of iterative reconstruction technique. COMPARISON:  02/10/18 FINDINGS: Brain: There are changes consistent with prior right MCA infarct new from the prior CT examination. Chronic atrophic changes are noted. No acute infarct is seen. No acute hemorrhage or space-occupying mass lesion is noted. Vascular: No hyperdense vessel or unexpected calcification. Skull: Normal. Negative for fracture or focal lesion. Sinuses/Orbits: Mucosal retention cyst is noted in the left maxillary antrum. Other: None. 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 Atrophic changes without acute abnormality. Electronically Signed   By: Inez Catalina M.D.   On: 12/04/2021 21:51   DG Chest Portable 1 View  Result Date: 11/30/2021 CLINICAL DATA:  sob EXAM: PORTABLE CHEST 1 VIEW COMPARISON:  12/01/2021. FINDINGS: Similar large of the cardiac silhouette. Similar layering bilateral pleural effusions with overlying bibasilar opacities. No visible pneumothorax. Scoliosis and polyarticular degenerative change. ACDF. IMPRESSION: 1. Similar layering bilateral  pleural effusion with overlying atelectasis and/or consolidation. 2. Similar cardiomegaly. Electronically Signed   By: Margaretha Sheffield M.D.   On: 11/29/2021 16:41    Cardiac Studies   N/A  Assessment   Principal Problem:   CHF exacerbation (HCC) Active Problems:   Chronic combined systolic and diastolic CHF (congestive heart failure) (HCC)   History of CVA with residual deficit   CKD (chronic kidney disease) stage 3, GFR 30-59 ml/min (HCC)   Left hemiplegia (HCC)   AKI (acute kidney injury) ([place])   Acute renal failure superimposed on stage 3 chronic kidney disease (HCC)    Atrial fibrillation with rapid ventricular response (HCC)   Elevated troponin   Chronic respiratory failure with hypoxia ([place])   Plan   No response to diuretics that I can tell -worsening renal function but not a candidate for dialysis per nephrology d/t comorbidities. Hyperkalemic today- would consider lokelma per primary service. Options for HF management are limited. Remains in afib with RVR, however, BP will not allow rate control. Started on midodrine. Would continue to pursue comfort measures with patient and family.  Time Spent Directly with Patient:  I have spent a total of 25 minutes with the patient reviewing hospital notes, telemetry, EKGs, labs and examining the patient as well as establishing an assessment and plan that was discussed personally with the patient.  > 50% of time was spent in direct patient care.  Length of Stay:  LOS: 1 day   Pixie Casino, MD, [facility], [place] Director of the Advanced Lipid Disorders &  Cardiovascular Risk Reduction Clinic Diplomate of the American Board of Clinical Lipidology Attending Cardiologist  Direct Dial: [phone]  Fax: [phone]  Website:  [email or web address]  Nadean Corwin Kei Langhorst 12/16/2021, 10:11 AM

## 2021-12-16 NOTE — Assessment & Plan Note (Addendum)
Acute metabolic encephalopathy Brain MRI with no acute changes.  Patient with residual aphasia and non ambulatory sate Poor prognosis Continue with fall and aspiration precautions.

## 2021-12-16 NOTE — Assessment & Plan Note (Addendum)
CKD stage 3 b, Hyperkalemia,  Progressive renal failure. Plan to continue with comfort measures.  Patient is not good candidate for renal replacement therapy due to significant comorbid conditions, including CVA and aphasia.  No further blood work indicated.

## 2021-12-16 NOTE — Assessment & Plan Note (Addendum)
Patient with decreased LV systolic function 94%  Patient has been refractive to IV diuretic therapy, worsening renal function with multiorgan failure. Plan to continue care under comfort care.  Poor prognosis and rapid declining health.     Patient getting about 50 mcg fentanyl boluses per day. This am already had 2 doses of 25 mcg.  Because high opioid analgesic requirements will start patient on low dose hydromorphone infusion.

## 2021-12-16 NOTE — Progress Notes (Signed)
On call MD on the unit to see pt. HR 140s sustaining. No distress noted. Pt. Resting in bed. Family at bedside.

## 2021-12-16 NOTE — Progress Notes (Signed)
Daily Progress Note   Patient Name: Brandy [name]       Date: 12/16/2021 DOB: [birth year]  Age: 81 y.o. MRN#: [id number] Attending Physician: Tawni Millers Primary Care Physician: [name], MD Admit Date: 12/08/2021  Reason for Consultation/Follow-up: Establishing goals of care  Subjective: Medical records reviewed. Patient assessed at the bedside. Discussed with RN.  Questions and concerns addressed. PMT will continue to support holistically.   Length of Stay: 1   Physical Exam Vitals and nursing note reviewed.  Constitutional:      Appearance: She is ill-appearing.     Interventions: Nasal cannula in place.     Comments: 5L  Cardiovascular:     Rate and Rhythm: Tachycardia present. Rhythm irregular.  Pulmonary:     Effort: Pulmonary effort is normal.  Skin:    General: Skin is warm and dry.  Neurological:     Mental Status: She is lethargic.            Vital Signs: BP (!) 81/59 (BP Location: Right Arm)   Pulse (!) 119   Temp 97.8 F (36.6 C) (Oral)   Resp 17   Ht '5\' 4"'$  (1.626 m)   Wt 56 kg   SpO2 100%   BMI 21.19 kg/m  SpO2: SpO2: 100 % O2 Device: O2 Device: Nasal Cannula O2 Flow Rate: O2 Flow Rate (L/min): 5 L/min      Palliative Assessment/Data:       Palliative Care Assessment & Plan   Patient Profile: ***  Assessment: ***  Recommendations/Plan: ***   Prognosis:  {Palliative Care Prognosis:23504}  Discharge Planning: {Palliative dispostion:23505}  Care plan was discussed with ***   Total time: I spent *** minutes in the care of the patient today in the above activities and documenting the encounter.  MDM ***         Norberta Keens, PA-C  Palliative Medicine Team Team phone # [phone]  Thank you for  allowing the Palliative Medicine Team to assist in the care of this patient. Please utilize secure chat with additional questions, if there is no response within 30 minutes please call the above phone number.  Palliative Medicine Team providers are available by phone from 7am to 7pm daily and can be reached through the team cell phone.  Should this patient require assistance  outside of these hours, please call the patient's attending physician.

## 2021-12-16 NOTE — Hospital Course (Addendum)
Brandy [name] was admitted to the hospital with the working diagnosis of heart failure decompensation.  81 yo female with the past medical history of heart failure, paroxysmal atrial fibrillation, COPD, CVA, CKD stage 3b, hypertension, wheelchair bound who presented with hypotension. Recent hospitalization for heart failure 08/28 to 09/02, patient was discharged on diuretic therapy. At home on day of hospitalization patient had abdominal pain, EMS was called, she was found hypotensive and she was transported to the ED. On her initial physical examination her blood pressure was 80 to 90 systolic, HR 998 RR 24 and 02 saturation 100%, lungs with no wheezing or rales, heart with S1 and S2 present and tachycardic, abdomen with no distention and no lower extremity edema. Neuro exam with right gaze preference, right facial droop and left hemiparesis,   Na 140, K 4,5 CL 105 bicarbonate 18 glucose 98 bun 79 cr 3,55  High sensitive troponin 387 and 406  Lactic acid 2,3  Wbc 9,0 hgb 14 plt 243  Sars covid 19 negative   Chest radiograph with cardiomegaly and left pleural effusion, mild hilar vascular congestion   EKG 124 bpm, left axis deviation, qtc not prolonged, atrial fibrillation rhythm with poor R to R progression, no significant ST segment or T wave changes.   Patient was placed on midodrine for blood pressure support and attempted diuresis with IV furosemide.  Poor prognosis and not candidate for renal replacement therapy.   09/21 patient with worsening condition, last night agitated and required fentanyl.  Her blood pressure continue to be low, her heart rate continue to be elevated.  Poor prognosis and rapidly deconditioning, I spoke with her family at the bedside and will transition to comfort care.   09/23 patient continue under comfort measures, imminent death and her family is at the bedside.  09/24 continue with comfort measures.

## 2021-12-16 NOTE — Consult Note (Signed)
   [facility] Inpatient Consult   12/16/2021  Brandy [name] 1940-06-21 [id number]  [name]  Accountable Care Organization [ACO] Patient: Brandy [name] PPO  Primary Care Provider:  [name], MD, is listed as her provider.   Patient screened for less than 30 days readmission hospitalization with noted  to assess for potential [facility] service needs for post hospital transition.  Patient discussed in progression meeting for potential care needs.  Came to bedside to speak with patient/family. However, the sitter [family provided] was at the bedside and states the husband went home to rest and to return around 2:30 pm.  Patient opened eyes to her name, however, no verbal response and returns to closing eyes. Reviewed MD progress notes and other consults notes.   Plan:  Will follow with inpatient [facility] team for any post hospital care coordination needs with [facility].  Continue to follow progress and disposition to assess for post hospital care management needs.    For questions contact:   Natividad Brood, RN BSN [place] [facility] Liaison  [phone] business mobile phone Toll free office [phone]  Fax number: [phone] [email or web address]

## 2021-12-16 NOTE — Progress Notes (Signed)
Progress Note   Patient: Brandy [name] CVE:[id number] DOB: [birth year] DOA: 12/19/2021     1 DOS: the patient was seen and examined on 12/16/2021   Brief hospital course: Brandy [name] was admitted to the hospital with the working diagnosis of heart failure decompensation.  81 yo female with the past medical history of heart failure, paroxysmal atrial fibrillation, COPD, CVA, CKD stage 3b, hypertension, wheelchair bound who presented with hypotension. Recent hospitalization for heart failure 08/28 to 09/02, patient was discharged on diuretic therapy. At home on day of hospitalization patient had abdominal pain, EMS was called, she was found hypotensive and she was transported to the ED. On her initial physical examination her blood pressure was 80 to 90 systolic, HR 025 RR 24 and 02 saturation 100%, lungs with no wheezing or rales, heart with S1 and S2 present and tachycardic, abdomen with no distention and no lower extremity edema. Neuro exam with right gaze preference, right facial droop and left hemiparesis,   Na 140, K 4,5 CL 105 bicarbonate 18 glucose 98 bun 79 cr 3,55  High sensitive troponin 387 and 406  Lactic acid 2,3  Wbc 9,0 hgb 14 plt 243  Sars covid 19 negative   Chest radiograph with cardiomegaly and left pleural effusion, mild hilar vascular congestion   EKG 124 bpm, left axis deviation, qtc not prolonged, atrial fibrillation rhythm with poor R to R progression, no significant ST segment or T wave changes.   Patient was placed on midodrine for blood pressure support and attempted diuresis with IV furosemide.  Poor prognosis and not candidate for renal replacement therapy.      Assessment and Plan: * Acute on chronic systolic CHF (congestive heart failure) ([place]) Patient with decreased LV systolic function 85%  Had furosemide one dose today On midodrine for hypotension.  Limited pharmacologic options due to renal failure and hypotension. Poor prognosis Continue with  palliative care   Atrial fibrillation with rapid ventricular response ([place]) Patient continue with atrial fibrillation with RVR Limited options to control rate.  Her short and long term prognosis are poor. Will benefit from continue palliative care.   Acute renal failure superimposed on stage 3 chronic kidney disease (HCC) CKD stage 3 b, Hyperkalemia,  Renal function with serum cr at 3,70 with K at 5,3 and serum bicarbonate at 20, Plan to continue close follow up on renal function and electrolytes Had sodium zirconium this am Patient is not good candidate for renal replacement therapy due to significant comorbid conditions, including CVA and aphasia.   Chronic respiratory failure with hypoxia (HCC) Continue supplemental 02 per [place] Diuresis as tolerated.   COPD with no acute exacerbation.   History of CVA with residual deficit Acute metabolic encephalopathy Brain MRI with no acute changes.  Patient with residual aphasia and non ambulatory sate Poor prognosis Continue with fall and aspiration precautions.         Subjective: Patient with not interactive, ill looking appearing   Physical Exam: Vitals:   12/15/21 2300 12/16/21 0400 12/16/21 0500 12/16/21 0600  BP: (S) (!) 84/70 (!) 86/78 (!) 89/71 (!) 81/59  Pulse:    (!) 119  Resp: 14   17  Temp:      TempSrc:    Oral  SpO2:      Weight:    56 kg  Height:       Neurology aphasia and not interactive ENT with mild pallor Cardiovascular with S1 and S2 present, tachycardic, irregularly irregular Lungs with rales at  bases with no wheezing Abdomen with no distention  No lower extremity edema Patient very deconditioned, and low muscle mass  Data Reviewed:    Family Communication: I spoke with patient's husband at the bedside, we talked in detail about patient's condition, plan of care and prognosis and all questions were addressed.   Disposition: Status is: Inpatient Remains inpatient appropriate because: atrial  fibrillation heart failure renal failure   Planned Discharge Destination: Home    Author: Tawni Millers, MD 12/16/2021 3:46 PM  For on call review [email or web address].

## 2021-12-16 NOTE — Progress Notes (Signed)
Speech Language Pathology Treatment: Dysphagia  Patient Details Name: Brandy [name] MRN: [id number] DOB: 05/28/1940 Today's Date: 12/16/2021 Time: 1540-0867 SLP Time Calculation (min) (ACUTE ONLY): 35 min  Assessment / Plan / Recommendation Clinical Impression  Patient seen today to determine readiness for po intake.  She was greeted awake in bed with sleeping spouse at bedside.   SLP provided oral care using toothbrush and did not place pt's dentures due to her level of dysarthria.    Pt provided with tsp amounts, small cup boluses of thin water, and single bite of strawberry icecream.  She declined to consume more intake but did express desire for "water" repeatedly.  Swallow is delayed - presumed oral transiting delay *as seen in prior MBS 03/2018* but pharyngeal swallow likely intact. Delayed swallow up to approx 21 seconds observed despite verbal cues.   Pt quickly became lethargic during session - with  her eyes rolling in her head- requiring consistent stimulation to participate.  Hand over hand assist provided to help with proprioception/neuro input to help maximize swallow safety.    No indication of aspiration with minimal amount pt consumed.  If pt is to consume po intake, it should be with known aspiration risks.  Spouse appeared hesitant re: clear diet but agreeable to tsp amounts of thin water for pt's comfort.   BP was decreased during session with elevated HR, thus SLP modified session based on pt's needs.  Using teach back with pt's spouse and caregiver to observe larynx elevate for swallow, use toothettes for moisture if pt not alert and provided tsps of thin water only when fully alert/upright and swallowing - education initiated.    Spoke to NT requesting oral suction be set up given pt's level of oral deficits elevating asp risk.   Poor prognosis for swallow function to return to adequate level with current performance, medical issue and h/o dysphagia.  Will follow up next date  for po readiness - note palliative has seen patient- thank you!    HPI HPI: chronic combined CHF/nonischemic cardiomyopathy, paroxysmal A-fib on Eliquis, COPD on home oxygen, CVA with left hemiplegia, bed and wheelchair bound, severe protein calories malnutrition, CKD stage IIIb, hypertension.  Recent outpatient echo done 09/23/2021 noted EF 20 to 25%, severe LVH, grade 3 diastolic dysfunction, small pericardial effusion, moderately reduced RV function, and myocardial pattern concerning for cardiac amyloidosis.  Pt has h/o CVA with dysphagia- oral more than pharyngeal per most recent MBS 03/31/2018.  She has received a PEG in the past but it was removed.      SLP Plan  Continue with current plan of care      Recommendations for follow up therapy are one component of a multi-disciplinary discharge planning process, led by the attending physician.  Recommendations may be updated based on patient status, additional functional criteria and insurance authorization.    Recommendations  Diet recommendations: Other(comment) (tsp amounts of water for comfort) Medication Administration: Via alternative means Supervision: Full supervision/cueing for compensatory strategies Compensations: Slow rate;Small sips/bites;Other (Comment) (cue pt to expectorate or oral suction if she does not swallo) Postural Changes and/or Swallow Maneuvers: Seated upright 90 degrees;Upright 30-60 min after meal                Oral Care Recommendations: Oral care QID;Staff/trained caregiver to provide oral care Follow Up Recommendations: Follow physician's recommendations for discharge plan and follow up therapies Assistance recommended at discharge: Frequent or constant Supervision/Assistance SLP Visit Diagnosis: Dysphagia, oral phase (R13.11) Plan: Continue with  current plan of care         Kathleen Lime, MS [facility] SLP [place] Office [phone] Pager [phone]   Brandy [name]  12/16/2021, 9:07  AM

## 2021-12-16 NOTE — TOC Progression Note (Signed)
Transition of Care [facility]) - Progression Note    Patient Details  Name: Brandy [name] MRN: [id number] Date of Birth: April 02, 1940  Transition of Care [facility]) CM/SW Contact  Zenon Mayo, RN Phone Number: 12/16/2021, 4:52 PM  Clinical Narrative:    From home with spouse, bed bound, presents with CHF, bp low, HR elevated, Palliative seeing. TOC following.        Expected Discharge Plan and Services                                                 Social Determinants of Health (SDOH) Interventions    Readmission Risk Interventions     No data to display

## 2021-12-16 NOTE — Assessment & Plan Note (Addendum)
Persistent RVR, patient with no apparent chest pain or dyspnea.  Discontinue telemetry monitoring.

## 2021-12-17 DIAGNOSIS — I5023 Acute on chronic systolic (congestive) heart failure: Secondary | ICD-10-CM | POA: Diagnosis not present

## 2021-12-17 DIAGNOSIS — I4891 Unspecified atrial fibrillation: Secondary | ICD-10-CM | POA: Diagnosis not present

## 2021-12-17 DIAGNOSIS — I959 Hypotension, unspecified: Secondary | ICD-10-CM

## 2021-12-17 DIAGNOSIS — I6930 Unspecified sequelae of cerebral infarction: Secondary | ICD-10-CM | POA: Diagnosis not present

## 2021-12-17 DIAGNOSIS — N170 Acute kidney failure with tubular necrosis: Secondary | ICD-10-CM | POA: Diagnosis not present

## 2021-12-17 MED ORDER — HALOPERIDOL 0.5 MG PO TABS: 0.5000 mg | ORAL_TABLET | ORAL | Status: DC | PRN

## 2021-12-17 MED ORDER — GLYCOPYRROLATE 0.2 MG/ML IJ SOLN: 0.2000 mg | INTRAMUSCULAR | Status: DC | PRN

## 2021-12-17 MED ORDER — HALOPERIDOL LACTATE 2 MG/ML PO CONC: 0.5000 mg | ORAL | Status: DC | PRN

## 2021-12-17 MED ORDER — FENTANYL CITRATE PF 50 MCG/ML IJ SOSY
25.0000 ug | PREFILLED_SYRINGE | INTRAMUSCULAR | Status: DC | PRN
  Administered 2021-12-17: 25 ug via INTRAVENOUS
  Administered 2021-12-17: 50 ug via INTRAVENOUS
  Filled 2021-12-17 (×2): qty 1

## 2021-12-17 MED ORDER — ACETAMINOPHEN 325 MG PO TABS: 650.0000 mg | ORAL_TABLET | Freq: Four times a day (QID) | ORAL | Status: DC | PRN

## 2021-12-17 MED ORDER — GLYCOPYRROLATE 1 MG PO TABS: 1.0000 mg | ORAL_TABLET | ORAL | Status: DC | PRN

## 2021-12-17 MED ORDER — LORAZEPAM 2 MG/ML IJ SOLN: 0.5000 mg | INTRAMUSCULAR | Status: DC

## 2021-12-17 MED ORDER — FENTANYL CITRATE PF 50 MCG/ML IJ SOSY
25.0000 ug | PREFILLED_SYRINGE | INTRAMUSCULAR | Status: DC
  Administered 2021-12-17 – 2021-12-20 (×9): 25 ug via INTRAVENOUS
  Filled 2021-12-17 (×9): qty 1

## 2021-12-17 MED ORDER — ONDANSETRON HCL 4 MG/2ML IJ SOLN: 4.0000 mg | Freq: Four times a day (QID) | INTRAMUSCULAR | Status: DC | PRN

## 2021-12-17 MED ORDER — POLYVINYL ALCOHOL 1.4 % OP SOLN: 1.0000 [drp] | Freq: Four times a day (QID) | OPHTHALMIC | Status: DC | PRN

## 2021-12-17 MED ORDER — ONDANSETRON 4 MG PO TBDP: 4.0000 mg | ORAL_TABLET | Freq: Four times a day (QID) | ORAL | Status: DC | PRN

## 2021-12-17 MED ORDER — ACETAMINOPHEN 650 MG RE SUPP: 650.0000 mg | Freq: Four times a day (QID) | RECTAL | Status: DC | PRN

## 2021-12-17 MED ORDER — HALOPERIDOL LACTATE 5 MG/ML IJ SOLN: 0.5000 mg | INTRAMUSCULAR | Status: DC | PRN

## 2021-12-17 MED ORDER — BIOTENE DRY MOUTH MT LIQD: 15.0000 mL | OROMUCOSAL | Status: DC | PRN

## 2021-12-17 MED ORDER — BISACODYL 10 MG RE SUPP: 10.0000 mg | Freq: Every day | RECTAL | Status: DC | PRN

## 2021-12-17 NOTE — Progress Notes (Signed)
[facility]) Hospital Liaison note:  This is a pending outpatient-based Palliative Care patient. Will continue to follow for disposition.  Please call with any outpatient palliative questions or concerns.  Thank you, Lorelee Market, LPN [facility] Liaison [phone]

## 2021-12-17 NOTE — Plan of Care (Addendum)
On call contacted overnight re: s/s of severe pain. [name] HR would sustain up into the 170's. Fentanyl given overnight with effectiveness. Husband at the bedside.   Last Fentanyl PRN given this shirt '@0253'$   0600: Pt RR 17 but more noisy & labored, sunken face, pale. Not tracking. Opens eyes w/ touch.      Problem: Education: Goal: Knowledge of General Education information will improve Description: Including pain rating scale, medication(s)/side effects and non-pharmacologic comfort measures Outcome: Progressing   Problem: Health Behavior/Discharge Planning: Goal: Ability to manage health-related needs will improve Outcome: Progressing   Problem: Clinical Measurements: Goal: Ability to maintain clinical measurements within normal limits will improve Outcome: Progressing Goal: Will remain free from infection Outcome: Progressing Goal: Diagnostic test results will improve Outcome: Progressing Goal: Respiratory complications will improve Outcome: Progressing Goal: Cardiovascular complication will be avoided Outcome: Progressing   Problem: Activity: Goal: Risk for activity intolerance will decrease Outcome: Progressing   Problem: Nutrition: Goal: Adequate nutrition will be maintained Outcome: Progressing   Problem: Coping: Goal: Level of anxiety will decrease Outcome: Progressing   Problem: Elimination: Goal: Will not experience complications related to bowel motility Outcome: Progressing Goal: Will not experience complications related to urinary retention Outcome: Progressing   Problem: Pain Managment: Goal: General experience of comfort will improve Outcome: Progressing   Problem: Safety: Goal: Ability to remain free from injury will improve Outcome: Progressing   Problem: Skin Integrity: Goal: Risk for impaired skin integrity will decrease Outcome: Progressing

## 2021-12-17 NOTE — Progress Notes (Signed)
Progress Note   Patient: Brandy [name] ZOX:[id number] DOB: Oct 16, 1940 DOA: 12/19/2021     2 DOS: the patient was seen and examined on 12/17/2021   Brief hospital course: Mrs. Burkel was admitted to the hospital with the working diagnosis of heart failure decompensation.  81 yo female with the past medical history of heart failure, paroxysmal atrial fibrillation, COPD, CVA, CKD stage 3b, hypertension, wheelchair bound who presented with hypotension. Recent hospitalization for heart failure 08/28 to 09/02, patient was discharged on diuretic therapy. At home on day of hospitalization patient had abdominal pain, EMS was called, she was found hypotensive and she was transported to the ED. On her initial physical examination her blood pressure was 80 to 90 systolic, HR 811 RR 24 and 02 saturation 100%, lungs with no wheezing or rales, heart with S1 and S2 present and tachycardic, abdomen with no distention and no lower extremity edema. Neuro exam with right gaze preference, right facial droop and left hemiparesis,   Na 140, K 4,5 CL 105 bicarbonate 18 glucose 98 bun 79 cr 3,55  High sensitive troponin 387 and 406  Lactic acid 2,3  Wbc 9,0 hgb 14 plt 243  Sars covid 19 negative   Chest radiograph with cardiomegaly and left pleural effusion, mild hilar vascular congestion   EKG 124 bpm, left axis deviation, qtc not prolonged, atrial fibrillation rhythm with poor R to R progression, no significant ST segment or T wave changes.   Patient was placed on midodrine for blood pressure support and attempted diuresis with IV furosemide.  Poor prognosis and not candidate for renal replacement therapy.   09/21 patient with worsening condition, last night agitated and required fentanyl.  Her blood pressure continue to be low, her heart rate continue to be elevated.  Poor prognosis and rapidly deconditioning, I spoke with her family at the bedside and will transition to comfort care.   Assessment and Plan: *  Acute on chronic systolic CHF (congestive heart failure) ([place]) Patient with decreased LV systolic function 91%  Patient has been refractive to IV diuretic therapy, worsening renal function with multiorgan failure. Plan to continue care under comfort care.  Poor prognosis and rapid declining health.    Atrial fibrillation with rapid ventricular response ([place]) Patient continue with atrial fibrillation with RVR Limited options to control rate.  Continue comfort care measures.   Acute renal failure superimposed on stage 3 chronic kidney disease (HCC) CKD stage 3 b, Hyperkalemia,  Progressive renal failure, no blood work today. Plan to continue with comfort measures.  Patient is not good candidate for renal replacement therapy due to significant comorbid conditions, including CVA and aphasia.   Chronic respiratory failure with hypoxia (HCC) COPD with no acute exacerbation.  Continue with supplemental 02 for comfort.  Patient with poor prognosis and rapidly declining.   History of CVA with residual deficit Acute metabolic encephalopathy Brain MRI with no acute changes.  Patient with residual aphasia and non ambulatory sate Poor prognosis Continue with fall and aspiration precautions.         Subjective: patient opens eyes to touch but not follow commands  Physical Exam: Vitals:   12/17/21 0500 12/17/21 0513 12/17/21 0700 12/17/21 0905  BP: (!) 66/55 114/65  (!) 80/63  Pulse: 68     Resp: '16 17  14  '$ Temp:      TempSrc:      SpO2: (!) 71%  90% 90%  Weight:      Height:  Neurology alert, not interactive, deconditioned and ill looking appearing  ENT with mild pallor Cardiovascular with S1 and S2 present, irregularly irregular with no gallops, rubs or murmurs Respiratory with decreased breath sounds  Abdomen not distended No lower extremity edema Poor muscle mass.   Data Reviewed:    Family Communication: I spoke with patient's daughter and husband at the  bedside, we talked in detail about patient's condition, plan of care and prognosis and all questions were addressed.   Disposition: Status is: Inpatient Remains inpatient appropriate because: multiorgan failure with imminent death   Planned Discharge Destination: Home     Author: Tawni Millers, MD 12/17/2021 11:12 AM  For on call review [email or web address].

## 2021-12-17 NOTE — Progress Notes (Signed)
Daily Progress Note   Patient Name: Brandy [name]       Date: 12/17/2021 DOB: 1941/02/28  Age: 81 y.o. MRN#: [id number] Attending Physician: Tawni Millers Primary Care Physician: [name], MD Admit Date: 12/03/2021  Reason for Consultation/Follow-up: Establishing goals of care  Subjective: AM: Medical records reviewed including progress notes, lans. Patient assessed at the bedside. She is restless and appears uncomfortable, looking around the room often. Her husband, daughter, and son-in-law are present visiting.  Reviewed course of hospital stay and overnight treatment of severe pain with PRN IV Fentanyl. We discussed a transition to comfort-focused care. Counseled that Apatient would no longer receive aggressive medical interventions such as continuous vital signs, lab work, radiology testing, or medications not focused on comfort. All care would focus on how the patient is looking and feeling. This would include management of any symptoms that may cause discomfort, pain, shortness of breath, cough, nausea, agitation, anxiety, and/or secretions etc. Symptoms would be managed with medications and other non-pharmacological interventions such as spiritual support if requested, repositioning, music therapy, or therapeutic listening. Family verbalized understanding and appreciation. They are agreeable to transition to comfort care today.  Questions and concerns addressed. PMT will continue to support holistically.   Length of Stay: 2   Physical Exam Vitals and nursing note reviewed.  Constitutional:      Appearance: She is ill-appearing.     Interventions: Nasal cannula in place.     Comments: 2L  Cardiovascular:     Rate and Rhythm: Tachycardia present. Rhythm irregular.   Pulmonary:     Effort: Pulmonary effort is normal.  Skin:    General: Skin is warm and dry.  Neurological:     Mental Status: She is lethargic.  Psychiatric:        Behavior: Behavior is agitated.            Vital Signs: BP (!) 80/63   Pulse 68   Temp (!) 96.8 F (36 C) (Axillary)   Resp 14   Ht '5\' 4"'$  (1.626 m)   Wt 56 kg   SpO2 90%   BMI 21.19 kg/m  SpO2: SpO2: 90 % O2 Device: O2 Device: Nasal Cannula O2 Flow Rate: O2 Flow Rate (L/min): 2 L/min      Palliative Assessment/Data: 10%     Palliative  Care Assessment & Plan   Patient Profile: 81 y.o. female  with past medical history of chronic combined CHF/nonischemic cardiomyopathy, paroxysmal A-fib on Eliquis, COPD on home oxygen, CVA with left hemiplegia, bed and wheelchair bound, severe protein calories malnutrition, CKD stage IIIb, hypertension admitted on 12/08/2021 with abdominal pain and changes in responsiveness.    PMT has been consulted to assist with goals of care conversation.    Assessment: Goals of care conversation AKI on CKD3 Acute systolic CHF  Recommendations/Plan: Continue DNR Transition to comfort care today IV Fentanyl scheduled and PRN for pain/air hunger/comfort Robinul PRN for excessive secretions IV Ativan scheduled PRN for agitation/anxiety Zofran PRN for nausea Liquifilm tears PRN for dry eyes Haldol PRN for agitation/anxiety May have comfort feeding if alert Comfort cart for family Unrestricted visitations in the setting of EOL (per policy) Oxygen PRN 2L or less for comfort. No escalation.     Prognosis:  Hours - Days  Discharge Planning: Anticipated Hospital Death  Care plan was discussed with patient's caregiver, patient's husband, Dr. Cathlean Sauer   MDM [facility]         Michaeleen Down Johnnette Litter, PA-C  Palliative Medicine Team Team phone # [phone]  Thank you for allowing the Palliative Medicine Team to assist in the care of this patient. Please utilize secure chat with  additional questions, if there is no response within 30 minutes please call the above phone number.  Palliative Medicine Team providers are available by phone from 7am to 7pm daily and can be reached through the team cell phone.  Should this patient require assistance outside of these hours, please call the patient's attending physician.

## 2021-12-17 NOTE — Progress Notes (Signed)
SLP Cancellation Note  Patient Details Name: Brandy [name] MRN: [id number] DOB: [birth year]   Cancelled treatment:       Reason Eval/Treat Not Completed: Other (comment) (Patient given Fentanyl over night for severe pain, per chart- pt with poor prognosis)  Kathleen Lime, MS [facility] SLP [place] Office [phone] Pager [phone]  Macario Golds 12/17/2021, 8:05 AM

## 2021-12-18 DIAGNOSIS — I5023 Acute on chronic systolic (congestive) heart failure: Secondary | ICD-10-CM | POA: Diagnosis not present

## 2021-12-18 DIAGNOSIS — I4891 Unspecified atrial fibrillation: Secondary | ICD-10-CM | POA: Diagnosis not present

## 2021-12-18 DIAGNOSIS — N170 Acute kidney failure with tubular necrosis: Secondary | ICD-10-CM | POA: Diagnosis not present

## 2021-12-18 DIAGNOSIS — J9611 Chronic respiratory failure with hypoxia: Secondary | ICD-10-CM | POA: Diagnosis not present

## 2021-12-18 NOTE — Progress Notes (Signed)
Patient FF:[id number] H Whidbee      DOB: Jun 04, 1940      JTT:[id number]      Palliative Medicine Team    Subjective: Bedside symptom check completed. Three family members present at time of visit. Chaplain bedside at end of visit.   Physical exam: Patient resting in bed with eyes closed at time of visit. Breathing even and non-labored on nasal cannula, no excessive secretions noted. Patient without physical or non-verbal signs of pain or discomfort at this time. Patient able to acknowledge this RN bedside, maintain brief eye contact, and answer single word answers intermittently with this RN.    Assessment and plan: Bedside RN without additional needs or concerns at this time, she feels good symptom management with current PRNs. Family hesitant to utilize ativan but understanding that if symptoms increase, willing to use with fentanyl as needed. Family without additional needs or concerns at this time. Will continue to follow for any changes or advances.    Thank you for allowing the Palliative Medicine Team to assist in the care of this patient.     Damian Leavell, MSN, [facility] Palliative Medicine Team Team Phone: [phone]  This phone is monitored 7a-7p, please reach out to attending physician outside of these hours for urgent needs.

## 2021-12-18 NOTE — Progress Notes (Signed)
Progress Note   Patient: Brandy [name] JJH:[id number] DOB: 12-Jun-1940 DOA: 12/13/2021     3 DOS: the patient was seen and examined on 12/18/2021   Brief hospital course: Mrs. Turley was admitted to the hospital with the working diagnosis of heart failure decompensation.  81 yo female with the past medical history of heart failure, paroxysmal atrial fibrillation, COPD, CVA, CKD stage 3b, hypertension, wheelchair bound who presented with hypotension. Recent hospitalization for heart failure 08/28 to 09/02, patient was discharged on diuretic therapy. At home on day of hospitalization patient had abdominal pain, EMS was called, she was found hypotensive and she was transported to the ED. On her initial physical examination her blood pressure was 80 to 90 systolic, HR 818 RR 24 and 02 saturation 100%, lungs with no wheezing or rales, heart with S1 and S2 present and tachycardic, abdomen with no distention and no lower extremity edema. Neuro exam with right gaze preference, right facial droop and left hemiparesis,   Na 140, K 4,5 CL 105 bicarbonate 18 glucose 98 bun 79 cr 3,55  High sensitive troponin 387 and 406  Lactic acid 2,3  Wbc 9,0 hgb 14 plt 243  Sars covid 19 negative   Chest radiograph with cardiomegaly and left pleural effusion, mild hilar vascular congestion   EKG 124 bpm, left axis deviation, qtc not prolonged, atrial fibrillation rhythm with poor R to R progression, no significant ST segment or T wave changes.   Patient was placed on midodrine for blood pressure support and attempted diuresis with IV furosemide.  Poor prognosis and not candidate for renal replacement therapy.   09/21 patient with worsening condition, last night agitated and required fentanyl.  Her blood pressure continue to be low, her heart rate continue to be elevated.  Poor prognosis and rapidly deconditioning, I spoke with her family at the bedside and will transition to comfort care.   Assessment and Plan: *  Acute on chronic systolic CHF (congestive heart failure) ([place]) Patient with decreased LV systolic function 56%  Patient has been refractive to IV diuretic therapy, worsening renal function with multiorgan failure. Plan to continue care under comfort care.  Poor prognosis and rapid declining health.    Atrial fibrillation with rapid ventricular response (HCC) Persistent RVR, patient with no apparent chest pain or dyspnea.  Discontinue telemetry monitoring.   Acute renal failure superimposed on stage 3 chronic kidney disease (HCC) CKD stage 3 b, Hyperkalemia,  Progressive renal failure. Plan to continue with comfort measures.  Patient is not good candidate for renal replacement therapy due to significant comorbid conditions, including CVA and aphasia.  No further blood work indicated.   Chronic respiratory failure with hypoxia (HCC) COPD with no acute exacerbation.  Continue with supplemental 02 for comfort.  Patient with poor prognosis and rapidly declining.   History of CVA with residual deficit Acute metabolic encephalopathy Brain MRI with no acute changes.  Patient with residual aphasia and non ambulatory sate Poor prognosis Continue with fall and aspiration precautions.         Subjective: Patient with no distress, no apparent pain or dyspnea   Physical Exam: Vitals:   12/17/21 1932 12/17/21 2137 12/18/21 0414 12/18/21 0915  BP: (!) 83/56 (!) 86/72 (!) 89/69 (!) 68/58  Pulse: (!) 132  (!) 130 (!) 128  Resp: '15  18 18  '$ Temp: 97.8 F (36.6 C)  97.7 F (36.5 C) (!) 97.5 F (36.4 C)  TempSrc: Axillary  Axillary Axillary  SpO2: (!) 77%  Weight:      Height:       Neurology awake, opens eyes to voice and touch  Patient very deconditioned, lungs with no wheezing or rhonchi   Data Reviewed:    Family Communication: I spoke with patient's husband at the bedside, we talked in detail about patient's condition, plan of care and prognosis and all questions were  addressed.    Disposition: Status is: Inpatient Remains inpatient appropriate because: comfort care, multiorgan failure.   Planned Discharge Destination:  to be determined      Author: Tawni Millers, MD 12/18/2021 9:40 AM  For on call review [email or web address].

## 2021-12-18 NOTE — Progress Notes (Signed)
This chaplain responded to PMT PA-Josseline's consult for prayer and support for the Pt. husband-Jacey.  The chaplain understands the Pt. will continue with comfort measures.  The Pt. husband, son, daughter, and friend are at the bedside.  The family accepted the chaplain's invitation to participate in storytelling. The Pt. while resting comfortably, intermittently responded with a blink or nod.  The chaplain understands the Pt. witnesses God's love through music education. 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  The chaplain provided emotional support for the family during the  sharing of a video of the Pt. playing the piano.    The chaplain affirmed the memories and the Pt. husband request for prayers of healing and comfort.  Chaplain Sallyanne Kuster [phone]

## 2021-12-18 NOTE — Progress Notes (Signed)
   Noted palliative care and family plans to transition to comfort care - no further recommendations at this time. Will sign off.  [place] will sign off.   Medication Recommendations:  none Other recommendations (labs, testing, etc):  none Follow up as an outpatient:  none  Pixie Casino, MD, [facility], [place] Director of the Advanced Lipid Disorders &  Cardiovascular Risk Reduction Clinic Diplomate of the American Board of Clinical Lipidology Attending Cardiologist  Direct Dial: [phone]  Fax: [phone]  Website:  [email or web address]

## 2021-12-18 NOTE — Care Management Important Message (Signed)
Important Message  Patient Details  Name: Brandy [name] MRN: [id number] Date of Birth: June 20, 1940   Medicare Important Message Given:  Yes     Shelda Altes 12/18/2021, 9:41 AM

## 2021-12-19 DIAGNOSIS — N170 Acute kidney failure with tubular necrosis: Secondary | ICD-10-CM | POA: Diagnosis not present

## 2021-12-19 DIAGNOSIS — J9611 Chronic respiratory failure with hypoxia: Secondary | ICD-10-CM | POA: Diagnosis not present

## 2021-12-19 DIAGNOSIS — I4891 Unspecified atrial fibrillation: Secondary | ICD-10-CM | POA: Diagnosis not present

## 2021-12-19 DIAGNOSIS — I5023 Acute on chronic systolic (congestive) heart failure: Secondary | ICD-10-CM | POA: Diagnosis not present

## 2021-12-19 LAB — CULTURE, BLOOD (ROUTINE X 2): Culture: NO GROWTH

## 2021-12-19 NOTE — Progress Notes (Signed)
Progress Note   Patient: Brandy [name] DOB: 05-30-1940 DOA: 12/06/2021     4 DOS: the patient was seen and examined on 12/19/2021   Brief hospital course: Brandy [name] was admitted to the hospital with the working diagnosis of heart failure decompensation.  81 yo female with the past medical history of heart failure, paroxysmal atrial fibrillation, COPD, CVA, CKD stage 3b, hypertension, wheelchair bound who presented with hypotension. Recent hospitalization for heart failure 08/28 to 09/02, patient was discharged on diuretic therapy. At home on day of hospitalization patient had abdominal pain, EMS was called, she was found hypotensive and she was transported to the ED. On her initial physical examination her blood pressure was 80 to 90 systolic, HR 751 RR 24 and 02 saturation 100%, lungs with no wheezing or rales, heart with S1 and S2 present and tachycardic, abdomen with no distention and no lower extremity edema. Neuro exam with right gaze preference, right facial droop and left hemiparesis,   Na 140, K 4,5 CL 105 bicarbonate 18 glucose 98 bun 79 cr 3,55  High sensitive troponin 387 and 406  Lactic acid 2,3  Wbc 9,0 hgb 14 plt 243  Sars covid 19 negative   Chest radiograph with cardiomegaly and left pleural effusion, mild hilar vascular congestion   EKG 124 bpm, left axis deviation, qtc not prolonged, atrial fibrillation rhythm with poor R to R progression, no significant ST segment or T wave changes.   Patient was placed on midodrine for blood pressure support and attempted diuresis with IV furosemide.  Poor prognosis and not candidate for renal replacement therapy.   09/21 patient with worsening condition, last night agitated and required fentanyl.  Her blood pressure continue to be low, her heart rate continue to be elevated.  Poor prognosis and rapidly deconditioning, I spoke with her family at the bedside and will transition to comfort care.   09/23 patient continue  under comfort measures, imminent death and her family is at the bedside.   Assessment and Plan: * Acute on chronic systolic CHF (congestive heart failure) ([place]) Patient with decreased LV systolic function 02%  Patient has been refractive to IV diuretic therapy, worsening renal function with multiorgan failure. Plan to continue care under comfort care.  Poor prognosis and rapid declining health.    Atrial fibrillation with rapid ventricular response (HCC) Persistent RVR, patient with no apparent chest pain or dyspnea.  Discontinue telemetry monitoring.   Acute renal failure superimposed on stage 3 chronic kidney disease (HCC) CKD stage 3 b, Hyperkalemia,  Progressive renal failure. Plan to continue with comfort measures.  Patient is not good candidate for renal replacement therapy due to significant comorbid conditions, including CVA and aphasia.  No further blood work indicated.   Chronic respiratory failure with hypoxia (HCC) COPD with no acute exacerbation.  Continue with supplemental 02 for comfort.  Patient with poor prognosis and rapidly declining.   History of CVA with residual deficit Acute metabolic encephalopathy Brain MRI with no acute changes.  Patient with residual aphasia and non ambulatory sate Poor prognosis Continue with fall and aspiration precautions.         Subjective: patient under comfort measures   Physical Exam: Vitals:   12/18/21 0915 12/18/21 1021 12/18/21 2018 12/19/21 0904  BP: (!) 68/58  92/74 92/70  Pulse: (!) 128  (!) 136 (!) 150  Resp: '18 17 10 19  '$ Temp: (!) 97.5 F (36.4 C)  98.4 F (36.9 C) 98.1 F (36.7 C)  TempSrc:  Axillary  Axillary Axillary  SpO2:      Weight:      Height:       Patient with no signs of distress Eyes closed, very deconditioned  Data Reviewed:    Family Communication: I spoke with patient's son and daughter at the bedside, we talked in detail about patient's condition, plan of care and prognosis and  all questions were addressed.   Disposition: Status is: Inpatient Remains inpatient appropriate because: imminent death   Planned Discharge Destination:  imminent death      Author: Tawni Millers, MD 12/19/2021 12:02 PM  For on call review [email or web address].

## 2021-12-20 DIAGNOSIS — Z515 Encounter for palliative care: Secondary | ICD-10-CM

## 2021-12-20 DIAGNOSIS — I5023 Acute on chronic systolic (congestive) heart failure: Secondary | ICD-10-CM | POA: Diagnosis not present

## 2021-12-20 DIAGNOSIS — Z7189 Other specified counseling: Secondary | ICD-10-CM | POA: Diagnosis not present

## 2021-12-20 DIAGNOSIS — N170 Acute kidney failure with tubular necrosis: Secondary | ICD-10-CM | POA: Diagnosis not present

## 2021-12-20 DIAGNOSIS — I6930 Unspecified sequelae of cerebral infarction: Secondary | ICD-10-CM | POA: Diagnosis not present

## 2021-12-20 DIAGNOSIS — I4891 Unspecified atrial fibrillation: Secondary | ICD-10-CM | POA: Diagnosis not present

## 2021-12-20 MED ORDER — SODIUM CHLORIDE 0.9 % IV SOLN
0.5000 mg/h | INTRAVENOUS | Status: DC
  Administered 2021-12-20: 0.5 mg/h via INTRAVENOUS
  Filled 2021-12-20: qty 5

## 2021-12-20 NOTE — Progress Notes (Signed)
Palliative Medicine Inpatient Follow Up Note   HPI: 81 y.o. female  with past medical history of chronic combined CHF/nonischemic cardiomyopathy, paroxysmal A-fib on Eliquis, COPD on home oxygen, CVA with left hemiplegia, bed and wheelchair bound, severe protein calories malnutrition, CKD stage IIIb, hypertension admitted on 12/01/2021 with abdominal pain and changes in responsiveness.   Today's Discussion 12/19/2021  *Please note that this is a verbal dictation therefore any spelling or grammatical errors are due to the "[employer]" system interpretation.  Chart reviewed inclusive of vital signs, progress notes, laboratory results, and diagnostic images.  Created space and opportunity for patient to explore thoughts feelings and fears regarding current medical situation.  Called to bedside by nursing staff in regards to patient's family needing further education in guarding comfort care measures.  I spoke with patient's daughter, and 2 friends that were present at the bedside.  Patient's daughter expressed her concern for her mother receiving a continuous pain medication as well as receiving scheduled fentanyl.  She states that she believes that her mother has been more awake in the past couple days and since this morning she has not been as responsive I reviewed and shared the reasoning why the hospitalist changed the patient to a continuous Dilaudid drip.  As explained to the note, due to increased frequency and patient's presenting status, Dilaudid was felt to better control symptom management including pain and air hunger.  Patient's daughter was excepting of this information and I discontinued the scheduled fentanyl orders.  I explained to the family that the patient's current status as she appears appears to be transitioning to death.  I educated bedside members that her respiratory status  is becoming more labored (increasingly apneic), her feet are becoming cool to touch as well as her  legs, and how these are all signs of impending death.  Friends and family at bedside understand death is near and I encouraged visitation for those that would like to see the patient for she passes.   All questions and concerns addressed. Palliative Support Provided.   Objective Assessment: Vital Signs Vitals:   12/19/21 1950 12/24/2021 0927  BP: 106/75 100/74  Pulse: (!) 42 (!) 157  Resp: 20 15  Temp: (!) 97.4 F (36.3 C) 97.7 F (36.5 C)  SpO2: 100% 97%    Intake/Output Summary (Last 24 hours) at 12/19/2021 1516 Last data filed at 12/05/2021 0800 Gross per 24 hour  Intake 0 ml  Output --  Net 0 ml   Last Weight  Most recent update: 12/16/2021  6:23 AM    Weight  56 kg (123 lb 7.3 oz)            Gen:  Elderly AA F in NAD HEENT: dry mucous membranes CV: Irregular rate and rhythm  PULM: Rhonchi to auscultation bilaterally. No wheezes/rales, increasing periods of apnea ABD: soft/nontender  EXT: No edema, bilateral distal lower extremity cool Neuro: Unresponsive  SUMMARY OF RECOMMENDATIONS   - Continue comfort care measures anticipating hospital death soon - Discontinuing scheduled fentanyl - Continue dilaudid gtt - Ongoing support  Billing based on MDM: High  Problems Addressed: One acute or chronic illness or injury that poses a threat to life or bodily function  Amount and/or Complexity of Data: Category 3:Discussion of management or test interpretation with external physician/other qualified health care professional/appropriate source (not separately reported)  Risks: Parenteral controlled substances, Decision regarding hospitalization or escalation of hospital care, and Decision not to resuscitate or to de-escalate care because  of poor prognosis ______________________________________________________________________________________ [place] [facility] Palliative Medicine Team Team Cell Phone: [phone] Please utilize secure chat with additional  questions, if there is no response within 30 minutes please call the above phone number  Palliative Medicine Team providers are available by phone from 7am to 7pm daily and can be reached through the team cell phone.  Should this patient require assistance outside of these hours, please call the patient's attending physician.

## 2021-12-20 NOTE — Progress Notes (Signed)
Patient had stopped breathing and no heart beat at 2100. Verified by this and nurse and CN Santiago Glad.  Husband and family at bedside MD Opyd notified.

## 2021-12-20 NOTE — Progress Notes (Signed)
Family at bedside. Comfort tray ordered.  Daymon Larsen, RN

## 2021-12-20 NOTE — Progress Notes (Addendum)
Progress Note   Patient: Brandy [name] RSW:[id number] DOB: [birth year] DOA: 12/17/2021     5 DOS: the patient was seen and examined on 12/16/2021   Brief hospital course: Mrs. Mccumbers was admitted to the hospital with the working diagnosis of heart failure decompensation.  81 yo female with the past medical history of heart failure, paroxysmal atrial fibrillation, COPD, CVA, CKD stage 3b, hypertension, wheelchair bound who presented with hypotension. Recent hospitalization for heart failure 08/28 to 09/02, patient was discharged on diuretic therapy. At home on day of hospitalization patient had abdominal pain, EMS was called, she was found hypotensive and she was transported to the ED. On her initial physical examination her blood pressure was 80 to 90 systolic, HR 093 RR 24 and 02 saturation 100%, lungs with no wheezing or rales, heart with S1 and S2 present and tachycardic, abdomen with no distention and no lower extremity edema. Neuro exam with right gaze preference, right facial droop and left hemiparesis,   Na 140, K 4,5 CL 105 bicarbonate 18 glucose 98 bun 79 cr 3,55  High sensitive troponin 387 and 406  Lactic acid 2,3  Wbc 9,0 hgb 14 plt 243  Sars covid 19 negative   Chest radiograph with cardiomegaly and left pleural effusion, mild hilar vascular congestion   EKG 124 bpm, left axis deviation, qtc not prolonged, atrial fibrillation rhythm with poor R to R progression, no significant ST segment or T wave changes.   Patient was placed on midodrine for blood pressure support and attempted diuresis with IV furosemide.  Poor prognosis and not candidate for renal replacement therapy.   09/21 patient with worsening condition, last night agitated and required fentanyl.  Her blood pressure continue to be low, her heart rate continue to be elevated.  Poor prognosis and rapidly deconditioning, I spoke with her family at the bedside and will transition to comfort care.   09/23 patient continue  under comfort measures, imminent death and her family is at the bedside.  09/24 continue with comfort measures.   Assessment and Plan: * Acute on chronic systolic CHF (congestive heart failure) ([place]) Patient with decreased LV systolic function 81%  Patient has been refractive to IV diuretic therapy, worsening renal function with multiorgan failure. Plan to continue care under comfort care.  Poor prognosis and rapid declining health.     Patient getting about 50 mcg fentanyl boluses per day. This am already had 2 doses of 25 mcg.  Because high opioid analgesic requirements will start patient on low dose hydromorphone infusion.   Atrial fibrillation with rapid ventricular response (HCC) Persistent RVR, patient with no apparent chest pain or dyspnea.  Discontinue telemetry monitoring.   Acute renal failure superimposed on stage 3 chronic kidney disease (HCC) CKD stage 3 b, Hyperkalemia,  Progressive renal failure. Plan to continue with comfort measures.  Patient is not good candidate for renal replacement therapy due to significant comorbid conditions, including CVA and aphasia.  No further blood work indicated.   Chronic respiratory failure with hypoxia (HCC) COPD with no acute exacerbation.  Continue with supplemental 02 for comfort.  Patient with poor prognosis and rapidly declining.   History of CVA with residual deficit Acute metabolic encephalopathy Brain MRI with no acute changes.  Patient with residual aphasia and non ambulatory sate Poor prognosis Continue with fall and aspiration precautions.         Subjective: Patient with not distress at the time of my examination, she has received 2 doses of fentanyl 25  already since this morning.   Physical Exam: Vitals:   12/18/21 2018 12/19/21 0904 12/19/21 1950 12/06/2021 0927  BP: 92/74 92/70 106/75 100/74  Pulse: (!) 136 (!) 150 (!) 42 (!) 157  Resp: '10 19 20 15  '$ Temp: 98.4 F (36.9 C) 98.1 F (36.7 C) (!) 97.4 F  (36.3 C) 97.7 F (36.5 C)  TempSrc: Axillary Axillary Axillary Axillary  SpO2:   100% 97%  Weight:      Height:       Patient deconditioned and ill looking appearing not in distress at the time of my examination, after 50 mcg fentanyl IV   Data Reviewed:    Family Communication: I spoke with patient's husband at the bedside, we talked in detail about patient's condition, plan of care and prognosis and all questions were addressed.   Disposition: Status is: Inpatient Remains inpatient appropriate because: imminent death   Planned Discharge Destination:  imminent death      Author: Tawni Millers, MD 12/25/2021 9:32 AM  For on call review [email or web address].

## 2021-12-20 NOTE — Progress Notes (Signed)
Patients family concerned about the amount of pain medication patient is scheduled to take, refusing fentanyl and ativan. Family also concerned the dilaudid medication ordered is going to cause patient to become addicted. Notified palliative care of families concerns. Patient appears to be resting comfortably with eyes closed with unlabored breathing.   Daymon Larsen, RN

## 2021-12-27 NOTE — Death Summary Note (Signed)
DEATH SUMMARY   Patient Details  Name: Brandy [name] MRN: [id number] DOB: April 14, 1940 KGM:WNUU, Brandy Sages, MD Admission/Discharge Information   Admit Date:  December 24, 2021  Date of Death: Date of Death: Dec 30, 2021  Time of Death: Time of Death: 06/19/2098  Length of Stay: 6   Principle Cause of death: systolic heart failure   Hospital Diagnoses: Principal Problem:   Acute on chronic systolic CHF (congestive heart failure) ([place]) Active Problems:   Atrial fibrillation with rapid ventricular response (HCC)   Acute renal failure superimposed on stage 3 chronic kidney disease ([place])   Chronic respiratory failure with hypoxia (HCC)   History of CVA with residual deficit   Hospital Course: Brandy [name] was admitted to the hospital with the working diagnosis of heart failure decompensation.  81 yo female with the past medical history of heart failure, paroxysmal atrial fibrillation, COPD, CVA, CKD stage 3b, hypertension, wheelchair bound who presented with hypotension. Recent hospitalization for heart failure 08/28 to 09/02, patient was discharged on diuretic therapy. At home on day of hospitalization patient had abdominal pain, EMS was called, she was found hypotensive and she was transported to the ED. On her initial physical examination her blood pressure was 80 to 90 systolic, HR 725 RR 24 and 02 saturation 100%, lungs with no wheezing or rales, heart with S1 and S2 present and tachycardic, abdomen with no distention and no lower extremity edema. Neuro exam with right gaze preference, right facial droop and left hemiparesis,   Na 140, K 4,5 CL 105 bicarbonate 18 glucose 98 bun 79 cr 3,55  High sensitive troponin 387 and 406  Lactic acid 2,3  Wbc 9,0 hgb 14 plt 243  Sars covid 19 negative   Chest radiograph with cardiomegaly and left pleural effusion, mild hilar vascular congestion   EKG 124 bpm, left axis deviation, qtc not prolonged, atrial fibrillation rhythm with poor R to R progression, no  significant ST segment or T wave changes.   Patient was placed on midodrine for blood pressure support and attempted diuresis with IV furosemide.  Poor prognosis and not candidate for renal replacement therapy.   09/21 patient with worsening condition, last night agitated and required fentanyl.  Her blood pressure continue to be low, her heart rate continue to be elevated.  Poor prognosis and rapidly deconditioning, I spoke with her family at the bedside and will transition to comfort care.   09/23 patient continue under comfort measures, imminent death and her family is at the bedside.  12/31/22 continue with comfort measures.   Assessment and Plan: * Acute on chronic systolic CHF (congestive heart failure) ([place]) Patient with decreased LV systolic function 36%  Patient has been refractive to IV diuretic therapy, worsening renal function with multiorgan failure. Plan to continue care under comfort care.  Poor prognosis and rapid declining health.     Patient getting about 50 mcg fentanyl boluses per day. This am already had 2 doses of 25 mcg.  Because high opioid analgesic requirements will start patient on low dose hydromorphone infusion.   Atrial fibrillation with rapid ventricular response (HCC) Persistent RVR, patient with no apparent chest pain or dyspnea.  Discontinue telemetry monitoring.   Acute renal failure superimposed on stage 3 chronic kidney disease (HCC) CKD stage 3 b, Hyperkalemia,  Progressive renal failure. Plan to continue with comfort measures.  Patient is not good candidate for renal replacement therapy due to significant comorbid conditions, including CVA and aphasia.  No further blood work indicated.   Chronic  respiratory failure with hypoxia (HCC) COPD with no acute exacerbation.  Continue with supplemental 02 for comfort.  Patient with poor prognosis and rapidly declining.   History of CVA with residual deficit Acute metabolic encephalopathy Brain MRI  with no acute changes.  Patient with residual aphasia and non ambulatory sate Poor prognosis Continue with fall and aspiration precautions.           Consultations: palliative care   The results of significant diagnostics from this hospitalization (including imaging, microbiology, ancillary and laboratory) are listed below for reference.   Significant Diagnostic Studies: MR BRAIN WO CONTRAST  Result Date: 12/15/2021 CLINICAL DATA:  Initial evaluation for neuro deficit, stroke suspected. EXAM: MRI HEAD WITHOUT CONTRAST TECHNIQUE: Multiplanar, multiecho pulse sequences of the brain and surrounding structures were obtained without intravenous contrast. COMPARISON:  CT from 12/11/2021. FINDINGS: Brain: Generalized age-related cerebral atrophy. Patchy and confluent T2/FLAIR hyperintensity involving the periventricular and deep white matter both cerebral hemispheres, most consistent with chronic small vessel ischemic disease. Encephalomalacia and gliosis involving the right cerebral hemisphere, consistent with a chronic right MCA distribution infarct. Scattered areas of associated chronic hemosiderin staining present within this region. Associated wallerian degeneration/atrophy present at the right cerebral peduncle and midbrain. Additional small remote left cerebellar infarct noted. No evidence for acute or subacute ischemia. Gray-white matter differentiation otherwise maintained. No acute intracranial hemorrhage. No mass lesion or significant mass effect. Ex vacuo dilatation of the right lateral ventricle related to the chronic right cerebral encephalomalacia. No hydrocephalus. No extra-axial fluid collection. Pituitary gland suprasellar region within normal limits. Vascular: Mildly heterogeneous flow void within the horizontal petrous right ICA, likely slow flow. This is presumably a chronic finding given the chronic right MCA territory infarct and lack of acute ischemic changes. Major intracranial  vascular flow voids are otherwise maintained. Skull and upper cervical spine: Craniocervical junction within normal limits. Postsurgical changes partially visualize within the cervical spine. Bone marrow signal intensity within normal limits. No scalp soft tissue abnormality. Sinuses/Orbits: Patient status post bilateral ocular lens replacement. Left maxillary sinus retention cyst noted. Scattered mucosal thickening noted within the ethmoidal air cells and maxillary sinuses as well. No significant mastoid effusion. Other: None. IMPRESSION: 1. No acute intracranial abnormality. 2. Large remote right MCA distribution infarct, with additional small left cerebellar infarct. 3. Underlying age-related cerebral atrophy with moderate chronic microvascular ischemic disease. Electronically Signed   By: Jeannine Boga M.D.   On: 12/15/2021 03:07   CT ABDOMEN PELVIS WO CONTRAST  Result Date: 11/29/2021 CLINICAL DATA:  Acute abdominal pain EXAM: CT ABDOMEN AND PELVIS WITHOUT CONTRAST TECHNIQUE: Multidetector CT imaging of the abdomen and pelvis was performed following the standard protocol without IV contrast. RADIATION DOSE REDUCTION: This exam was performed according to the departmental dose-optimization program which includes automated exposure control, adjustment of the mA and/or kV according to patient size and/or use of iterative reconstruction technique. COMPARISON:  11/26/2021 FINDINGS: Lower chest: Bilateral moderate pleural effusions right slightly greater than left. Some associated atelectatic changes are noted. Hepatobiliary: Gallbladder is within normal limits. Liver again demonstrates right hepatic cyst stable from the prior study. Pancreas: Unremarkable. No pancreatic ductal dilatation or surrounding inflammatory changes. Spleen: Normal in size without focal abnormality. Adrenals/Urinary Tract: Adrenal glands are within normal limits. Left renal cyst is noted and stable. No further follow-up is  recommended. No obstructive changes are seen. The bladder is decompressed. Stomach/Bowel: Findings of rectal impaction noted with wall thickening and Peri rectal inflammatory changes consistent with stercoral proctitis. Remainder of the  colon appears within normal limits. Appendix is unremarkable. Small bowel and stomach are within normal limits. Vascular/Lymphatic: Aortic atherosclerosis. No enlarged abdominal or pelvic lymph nodes. Reproductive: Uterus and bilateral adnexa are unremarkable. Other: No abdominal wall hernia or abnormality. No abdominopelvic ascites. Musculoskeletal: Degenerative changes of lumbar spine are noted. No compression deformity is noted. IMPRESSION: Findings of rectal impaction with wall thickening and. Rectal inflammatory change consistent with proctitis. This has increased somewhat in the interval from the prior exam. Bilateral moderate pleural effusions with associated atelectatic changes. Hepatic and renal cysts.  No follow-up is recommended. Electronically Signed   By: Inez Catalina M.D.   On: 11/28/2021 21:54   CT Head Wo Contrast  Result Date: 12/26/2021 CLINICAL DATA:  Altered mental status EXAM: CT HEAD WITHOUT CONTRAST TECHNIQUE: Contiguous axial images were obtained from the base of the skull through the vertex without intravenous contrast. RADIATION DOSE REDUCTION: This exam was performed according to the departmental dose-optimization program which includes automated exposure control, adjustment of the mA and/or kV according to patient size and/or use of iterative reconstruction technique. COMPARISON:  02/10/18 FINDINGS: Brain: There are changes consistent with prior right MCA infarct new from the prior CT examination. Chronic atrophic changes are noted. No acute infarct is seen. No acute hemorrhage or space-occupying mass lesion is noted. Vascular: No hyperdense vessel or unexpected calcification. Skull: Normal. Negative for fracture or focal lesion. Sinuses/Orbits:  Mucosal retention cyst is noted in the left maxillary antrum. Other: None. 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 Atrophic changes without acute abnormality. Electronically Signed   By: Inez Catalina M.D.   On: 12/01/2021 21:51   DG Chest Portable 1 View  Result Date: 12/19/2021 CLINICAL DATA:  sob EXAM: PORTABLE CHEST 1 VIEW COMPARISON:  12/01/2021. FINDINGS: Similar large of the cardiac silhouette. Similar layering bilateral pleural effusions with overlying bibasilar opacities. No visible pneumothorax. Scoliosis and polyarticular degenerative change. ACDF. IMPRESSION: 1. Similar layering bilateral pleural effusion with overlying atelectasis and/or consolidation. 2. Similar cardiomegaly. Electronically Signed   By: Margaretha Sheffield M.D.   On: 12/17/2021 16:41   DG Chest Port 1 View  Result Date: 12/01/2021 CLINICAL DATA:  Heart failure. EXAM: PORTABLE CHEST 1 VIEW COMPARISON:  Portable chest 11/23/2021 FINDINGS: 6:51 a.m. There are multiple overlying monitor wires. There is mild cardiomegaly. Perihilar vascular congestion and mild central interstitial edema continue to be seen, with moderate layering pleural effusions. There are opacities of the left-greater-than-right lung bases which could be atelectasis or pneumonic consolidation. The upper lung fields remain generally clear. Overall aeration is unchanged. There is aortic tortuosity and arthrosclerosis. Mid to lower thoracic dextroscoliosis and osteopenia. Three level lower cervical ACDF plating. IMPRESSION: Cardiomegaly, mild central edema and moderate pleural effusions with overlying opacities on the left-greater-than-right. Overall aeration and edema seem unchanged. Electronically Signed   By: Telford Nab M.D.   On: 12/01/2021 07:13   CT ABDOMEN PELVIS WO CONTRAST  Result Date: 11/26/2021 CLINICAL DATA:  Abdominal pain, acute, nonlocalized. General surgery requesting CT  with rectal contrast to evaluate for trapped air in sigmoid colon. EXAM: CT ABDOMEN AND PELVIS WITHOUT CONTRAST TECHNIQUE: Multidetector CT imaging of the abdomen and pelvis was performed following the standard protocol without IV contrast. RADIATION DOSE REDUCTION: This exam was performed according to the departmental dose-optimization program which includes automated exposure control, adjustment of the mA and/or kV according to patient size and/or use of iterative reconstruction technique. COMPARISON:  11/26/2021. FINDINGS: Lower chest:  Heart is enlarged and there is a trace pericardial effusion. Small pleural effusions are noted at the lung bases bilaterally. Hepatobiliary: A cyst is noted in the anterior right lobe of the liver measuring 3 cm. A 1.2 cm cyst is present in the posterior right lobe of the liver. Additional subcentimeter hypodensities are identified which are too small to further characterize. The gallbladder is without stones. No biliary ductal dilatation. Pancreas: Unremarkable. No pancreatic ductal dilatation or surrounding inflammatory changes. Spleen: Normal in size without focal abnormality. Adrenals/Urinary Tract: The adrenal glands are within normal limits. A cyst is present in the upper pole of the left kidney. No renal calculus or hydronephrosis. The bladder is unremarkable. Stomach/Bowel: A rectal tube is present and contrast is identified in the colon. There is mild gaseous distention of the rectosigmoid colon without evidence of obstruction. There is mild rectal wall thickening with surrounding fat stranding in the presacral space. A normal appendix is seen in the right lower quadrant. Multiple scattered diverticula are noted along the colon without evidence of diverticulitis. There is a small hiatal hernia. Stomach is otherwise within normal limits. No free air or pneumatosis. Vascular/Lymphatic: Aortic atherosclerosis. No enlarged abdominal or pelvic lymph nodes. Reproductive: Uterus  and bilateral adnexa are unremarkable. Other: No abdominopelvic ascites. Musculoskeletal: Mild subcutaneous fat stranding is noted in the lateral abdominal walls and hips bilaterally, greater on the left than on the right. Degenerative changes in the thoracolumbar spine. No acute osseous abnormality. IMPRESSION: 1. Mildly distended gas-filled rectosigmoid colon with no evidence of obstruction. 2. Mild rectal wall thickening with surrounding fat stranding suggesting proctitis. 3. Diverticulosis without diverticulitis. 4. Small bilateral pleural effusions. 5. Small hiatal hernia. 6. Hepatic cysts. 7. Cardiomegaly with small pericardial effusion. 8. Aortic atherosclerosis. 9. Subcutaneous fat stranding in the lateral abdominal and hip walls, possible edema. Electronically Signed   By: Brett Fairy M.D.   On: 11/26/2021 04:50   DG Abd 1 View  Result Date: 11/26/2021 CLINICAL DATA:  Abdominal pain. EXAM: ABDOMEN - 1 VIEW COMPARISON:  December 05, 2019 FINDINGS: A short segment of prominent, air-filled bowel is seen overlying the medial aspect of the mid to lower right abdomen. Normal caliber large and small bowel loops are seen throughout the remainder of the abdomen. There is no evidence of free air. No radio-opaque calculi or other significant radiographic abnormality are seen. IMPRESSION: Findings which may represent a distended, air-filled segment of sigmoid colon (i.e. " coffee bean sign"). Further evaluation with abdomen and pelvis CT is recommended to exclude the presence of a sigmoid volvulus. Electronically Signed   By: Virgina Norfolk M.D.   On: 11/26/2021 02:54   DG Chest Portable 1 View  Result Date: 11/23/2021 CLINICAL DATA:  Dyspnea. Worsening shortness of breath over the last several days. EXAM: PORTABLE CHEST 1 VIEW COMPARISON:  Radiographs 01/29/2020 and 01/21/2020.  CT 09/14/2017. FINDINGS: 1532 hours. The lower left chest is partially excluded from this portable examination. The heart is  enlarged but stable. There is aortic atherosclerosis. Moderate bilateral pleural effusions with pulmonary edema, similar to prior radiographs. No evidence of pneumothorax. Previous cervical fusion without evidence of acute osseous abnormality. IMPRESSION: Cardiomegaly, pulmonary edema and bilateral pleural effusions consistent with recurrent congestive heart failure. Electronically Signed   By: Richardean Sale M.D.   On: 11/23/2021 15:42    Microbiology: Recent Results (from the past 240 hour(s))  SARS Coronavirus 2 by RT PCR (hospital order, performed in [facility] hospital lab) *cepheid single result test* Anterior Nasal  Swab     Status: None   Collection Time: 12/06/2021  3:47 PM   Specimen: Anterior Nasal Swab  Result Value Ref Range Status   SARS Coronavirus 2 by RT PCR NEGATIVE NEGATIVE Final    Comment: (NOTE) SARS-CoV-2 target nucleic acids are NOT DETECTED.  The SARS-CoV-2 RNA is generally detectable in upper and lower respiratory specimens during the acute phase of infection. The lowest concentration of SARS-CoV-2 viral copies this assay can detect is 250 copies / mL. A negative result does not preclude SARS-CoV-2 infection and should not be used as the sole basis for treatment or other patient management decisions.  A negative result may occur with improper specimen collection / handling, submission of specimen other than nasopharyngeal swab, presence of viral mutation(s) within the areas targeted by this assay, and inadequate number of viral copies (<250 copies / mL). A negative result must be combined with clinical observations, patient history, and epidemiological information.  Fact Sheet for Patients:   https://www.patel.info/  Fact Sheet for Healthcare Providers: https://hall.com/  This test is not yet approved or  cleared by the [place] FDA and has been authorized for detection and/or diagnosis of SARS-CoV-2 by FDA under  an Emergency Use Authorization (EUA).  This EUA will remain in effect (meaning this test can be used) for the duration of the COVID-19 declaration under Section 564(b)(1) of the Act, 21 U.S.C. section 360bbb-3(b)(1), unless the authorization is terminated or revoked sooner.  Performed at [facility], [place] [address], [place], [place] [address]   Culture, blood (Routine x 2)     Status: None   Collection Time: 12/19/2021  4:10 PM   Specimen: BLOOD  Result Value Ref Range Status   Specimen Description BLOOD BLOOD LEFT ARM  Final   Special Requests   Final    BOTTLES DRAWN AEROBIC AND ANAEROBIC Blood Culture results may not be optimal due to an excessive volume of blood received in culture bottles   Culture   Final    NO GROWTH 5 DAYS Performed at [facility], [place] [address], [place], [place] [address]    Report Status 12/19/2021 FINAL  Final    Signed: Tawni Millers, MD

## 2021-12-27 NOTE — Progress Notes (Signed)
Wasted dilaudid drip 75 ml/35 mg with Florene Route RN and Suzette Battiest RN in the [employer]

## 2021-12-27 DEATH — deceased
